# Patient Record
Sex: Female | Born: 1955
Health system: Southern US, Community
[De-identification: ages and names within clinical notes are randomized; demographics above are authoritative.]

## PROBLEM LIST (undated history)

## (undated) DIAGNOSIS — F419 Anxiety disorder, unspecified: Secondary | ICD-10-CM

## (undated) DIAGNOSIS — F32A Depression, unspecified: Secondary | ICD-10-CM

## (undated) DIAGNOSIS — E039 Hypothyroidism, unspecified: Secondary | ICD-10-CM

## (undated) DIAGNOSIS — K219 Gastro-esophageal reflux disease without esophagitis: Secondary | ICD-10-CM

## (undated) DIAGNOSIS — Z9289 Personal history of other medical treatment: Secondary | ICD-10-CM

## (undated) DIAGNOSIS — IMO0002 Reserved for concepts with insufficient information to code with codable children: Secondary | ICD-10-CM

## (undated) DIAGNOSIS — G709 Myoneural disorder, unspecified: Secondary | ICD-10-CM

## (undated) DIAGNOSIS — M1990 Unspecified osteoarthritis, unspecified site: Secondary | ICD-10-CM

## (undated) DIAGNOSIS — F17200 Nicotine dependence, unspecified, uncomplicated: Secondary | ICD-10-CM

## (undated) DIAGNOSIS — H269 Unspecified cataract: Secondary | ICD-10-CM

## (undated) DIAGNOSIS — E785 Hyperlipidemia, unspecified: Secondary | ICD-10-CM

## (undated) DIAGNOSIS — J40 Bronchitis, not specified as acute or chronic: Secondary | ICD-10-CM

## (undated) DIAGNOSIS — J449 Chronic obstructive pulmonary disease, unspecified: Secondary | ICD-10-CM

## (undated) DIAGNOSIS — M810 Age-related osteoporosis without current pathological fracture: Secondary | ICD-10-CM

## (undated) DIAGNOSIS — I10 Essential (primary) hypertension: Secondary | ICD-10-CM

## (undated) DIAGNOSIS — Z8249 Family history of ischemic heart disease and other diseases of the circulatory system: Secondary | ICD-10-CM

## (undated) DIAGNOSIS — Z87898 Personal history of other specified conditions: Secondary | ICD-10-CM

## (undated) DIAGNOSIS — T7840XA Allergy, unspecified, initial encounter: Secondary | ICD-10-CM

## (undated) DIAGNOSIS — R0602 Shortness of breath: Secondary | ICD-10-CM

## (undated) DIAGNOSIS — M353 Polymyalgia rheumatica: Secondary | ICD-10-CM

## (undated) DIAGNOSIS — R6510 Systemic inflammatory response syndrome (SIRS) of non-infectious origin without acute organ dysfunction: Secondary | ICD-10-CM

## (undated) DIAGNOSIS — N189 Chronic kidney disease, unspecified: Secondary | ICD-10-CM

## (undated) DIAGNOSIS — M316 Other giant cell arteritis: Secondary | ICD-10-CM

## (undated) DIAGNOSIS — M8700 Idiopathic aseptic necrosis of unspecified bone: Secondary | ICD-10-CM

## (undated) DIAGNOSIS — F329 Major depressive disorder, single episode, unspecified: Secondary | ICD-10-CM

## (undated) DIAGNOSIS — J439 Emphysema, unspecified: Secondary | ICD-10-CM

## (undated) DIAGNOSIS — D649 Anemia, unspecified: Secondary | ICD-10-CM

## (undated) DIAGNOSIS — M8580 Other specified disorders of bone density and structure, unspecified site: Secondary | ICD-10-CM

## (undated) HISTORY — PX: COLONOSCOPY: SHX174

## (undated) HISTORY — DX: Idiopathic aseptic necrosis of unspecified bone: M87.00

## (undated) HISTORY — DX: Allergy, unspecified, initial encounter: T78.40XA

## (undated) HISTORY — DX: Anxiety disorder, unspecified: F41.9

## (undated) HISTORY — DX: Myoneural disorder, unspecified: G70.9

## (undated) HISTORY — DX: Hypothyroidism, unspecified: E03.9

## (undated) HISTORY — DX: Personal history of other specified conditions: Z87.898

## (undated) HISTORY — PX: COLONOSCOPY W/ POLYPECTOMY: SHX1380

## (undated) HISTORY — PX: TONSILLECTOMY: SUR1361

## (undated) HISTORY — PX: ABDOMINAL ADHESION SURGERY: SHX90

## (undated) HISTORY — DX: Nicotine dependence, unspecified, uncomplicated: F17.200

## (undated) HISTORY — DX: Hyperlipidemia, unspecified: E78.5

## (undated) HISTORY — PX: TOTAL HIP ARTHROPLASTY: SHX124

## (undated) HISTORY — DX: Age-related osteoporosis without current pathological fracture: M81.0

## (undated) HISTORY — PX: CATARACT EXTRACTION: SUR2

## (undated) HISTORY — PX: CHOLECYSTECTOMY: SHX55

## (undated) HISTORY — DX: Other specified disorders of bone density and structure, unspecified site: M85.80

## (undated) HISTORY — DX: Other giant cell arteritis: M31.6

## (undated) HISTORY — DX: Shortness of breath: R06.02

## (undated) HISTORY — PX: WRIST SURGERY: SHX841

## (undated) HISTORY — DX: Anemia, unspecified: D64.9

## (undated) HISTORY — DX: Emphysema, unspecified: J43.9

## (undated) HISTORY — DX: Unspecified cataract: H26.9

## (undated) HISTORY — PX: ABDOMINAL HYSTERECTOMY: SHX81

## (undated) HISTORY — PX: TOTAL SHOULDER REPLACEMENT: SUR1217

## (undated) HISTORY — DX: Unspecified osteoarthritis, unspecified site: M19.90

## (undated) HISTORY — PX: INCONTINENCE SURGERY: SHX676

## (undated) HISTORY — DX: Chronic kidney disease, unspecified: N18.9

## (undated) HISTORY — PX: BREAST EXCISIONAL BIOPSY: SUR124

## (undated) HISTORY — DX: Reserved for concepts with insufficient information to code with codable children: IMO0002

## (undated) HISTORY — DX: Chronic obstructive pulmonary disease, unspecified: J44.9

## (undated) HISTORY — DX: Family history of ischemic heart disease and other diseases of the circulatory system: Z82.49

## (undated) HISTORY — DX: Major depressive disorder, single episode, unspecified: F32.9

## (undated) HISTORY — PX: BREAST SURGERY: SHX581

## (undated) HISTORY — PX: REDUCTION MAMMAPLASTY: SUR839

## (undated) HISTORY — DX: Depression, unspecified: F32.A

---

## 1998-10-08 ENCOUNTER — Encounter: Payer: Self-pay | Admitting: Family Medicine

## 1998-10-08 ENCOUNTER — Ambulatory Visit (HOSPITAL_COMMUNITY): Admission: RE | Admit: 1998-10-08 | Discharge: 1998-10-08 | Payer: Self-pay | Admitting: Family Medicine

## 1998-10-14 ENCOUNTER — Ambulatory Visit (HOSPITAL_COMMUNITY): Admission: RE | Admit: 1998-10-14 | Discharge: 1998-10-14 | Payer: Self-pay | Admitting: Family Medicine

## 1998-10-14 ENCOUNTER — Encounter: Payer: Self-pay | Admitting: Family Medicine

## 2000-07-15 ENCOUNTER — Other Ambulatory Visit: Admission: RE | Admit: 2000-07-15 | Discharge: 2000-07-15 | Payer: Self-pay | Admitting: Family Medicine

## 2000-07-28 ENCOUNTER — Ambulatory Visit (HOSPITAL_COMMUNITY): Admission: RE | Admit: 2000-07-28 | Discharge: 2000-07-28 | Payer: Self-pay | Admitting: Family Medicine

## 2000-07-28 ENCOUNTER — Encounter: Payer: Self-pay | Admitting: Family Medicine

## 2000-08-27 ENCOUNTER — Ambulatory Visit (HOSPITAL_COMMUNITY): Admission: RE | Admit: 2000-08-27 | Discharge: 2000-08-27 | Payer: Self-pay | Admitting: Gastroenterology

## 2000-08-27 ENCOUNTER — Encounter (INDEPENDENT_AMBULATORY_CARE_PROVIDER_SITE_OTHER): Payer: Self-pay | Admitting: *Deleted

## 2001-04-06 ENCOUNTER — Encounter: Admission: RE | Admit: 2001-04-06 | Discharge: 2001-04-06 | Payer: Self-pay | Admitting: Family Medicine

## 2001-04-06 ENCOUNTER — Encounter: Payer: Self-pay | Admitting: Family Medicine

## 2002-08-11 ENCOUNTER — Inpatient Hospital Stay (HOSPITAL_COMMUNITY): Admission: EM | Admit: 2002-08-11 | Discharge: 2002-08-15 | Payer: Self-pay | Admitting: Psychiatry

## 2002-08-11 ENCOUNTER — Encounter: Payer: Self-pay | Admitting: Emergency Medicine

## 2002-08-16 ENCOUNTER — Other Ambulatory Visit (HOSPITAL_COMMUNITY): Admission: RE | Admit: 2002-08-16 | Discharge: 2002-08-28 | Payer: Self-pay | Admitting: Psychiatry

## 2002-08-29 ENCOUNTER — Encounter (HOSPITAL_COMMUNITY): Admission: RE | Admit: 2002-08-29 | Discharge: 2002-08-29 | Payer: Self-pay | Admitting: Psychiatry

## 2002-09-05 ENCOUNTER — Encounter (HOSPITAL_COMMUNITY): Admission: RE | Admit: 2002-09-05 | Discharge: 2002-09-05 | Payer: Self-pay | Admitting: Psychiatry

## 2002-09-19 ENCOUNTER — Encounter (HOSPITAL_COMMUNITY): Admission: RE | Admit: 2002-09-19 | Discharge: 2002-09-19 | Payer: Self-pay | Admitting: Psychiatry

## 2004-03-11 ENCOUNTER — Observation Stay (HOSPITAL_COMMUNITY): Admission: EM | Admit: 2004-03-11 | Discharge: 2004-03-12 | Payer: Self-pay | Admitting: Family Medicine

## 2004-12-05 ENCOUNTER — Encounter: Admission: RE | Admit: 2004-12-05 | Discharge: 2004-12-05 | Payer: Self-pay | Admitting: Internal Medicine

## 2005-01-05 HISTORY — PX: CARDIAC CATHETERIZATION: SHX172

## 2005-03-17 ENCOUNTER — Encounter: Admission: RE | Admit: 2005-03-17 | Discharge: 2005-03-17 | Payer: Self-pay | Admitting: Family Medicine

## 2005-06-15 ENCOUNTER — Encounter (INDEPENDENT_AMBULATORY_CARE_PROVIDER_SITE_OTHER): Payer: Self-pay | Admitting: Specialist

## 2005-06-15 ENCOUNTER — Ambulatory Visit (HOSPITAL_COMMUNITY): Admission: RE | Admit: 2005-06-15 | Discharge: 2005-06-15 | Payer: Self-pay | Admitting: *Deleted

## 2005-08-04 ENCOUNTER — Encounter: Admission: RE | Admit: 2005-08-04 | Discharge: 2005-08-04 | Payer: Self-pay | Admitting: Urology

## 2005-08-05 ENCOUNTER — Ambulatory Visit (HOSPITAL_BASED_OUTPATIENT_CLINIC_OR_DEPARTMENT_OTHER): Admission: RE | Admit: 2005-08-05 | Discharge: 2005-08-05 | Payer: Self-pay | Admitting: Urology

## 2006-01-28 ENCOUNTER — Ambulatory Visit (HOSPITAL_COMMUNITY): Admission: RE | Admit: 2006-01-28 | Discharge: 2006-01-28 | Payer: Self-pay | Admitting: Family Medicine

## 2006-05-04 ENCOUNTER — Encounter: Admission: RE | Admit: 2006-05-04 | Discharge: 2006-05-04 | Payer: Self-pay | Admitting: Family Medicine

## 2007-06-01 ENCOUNTER — Encounter: Admission: RE | Admit: 2007-06-01 | Discharge: 2007-06-01 | Payer: Self-pay | Admitting: Family Medicine

## 2007-08-06 ENCOUNTER — Emergency Department (HOSPITAL_COMMUNITY): Admission: EM | Admit: 2007-08-06 | Discharge: 2007-08-06 | Payer: Self-pay | Admitting: Emergency Medicine

## 2007-08-23 ENCOUNTER — Ambulatory Visit (HOSPITAL_BASED_OUTPATIENT_CLINIC_OR_DEPARTMENT_OTHER): Admission: RE | Admit: 2007-08-23 | Discharge: 2007-08-23 | Payer: Self-pay | Admitting: Surgery

## 2007-08-23 ENCOUNTER — Encounter (INDEPENDENT_AMBULATORY_CARE_PROVIDER_SITE_OTHER): Payer: Self-pay | Admitting: Surgery

## 2008-06-05 ENCOUNTER — Encounter: Admission: RE | Admit: 2008-06-05 | Discharge: 2008-06-05 | Payer: Self-pay | Admitting: Family Medicine

## 2008-06-06 ENCOUNTER — Encounter: Admission: RE | Admit: 2008-06-06 | Discharge: 2008-06-06 | Payer: Self-pay | Admitting: Family Medicine

## 2008-06-19 ENCOUNTER — Ambulatory Visit (HOSPITAL_COMMUNITY): Admission: RE | Admit: 2008-06-19 | Discharge: 2008-06-19 | Payer: Self-pay | Admitting: *Deleted

## 2008-06-19 ENCOUNTER — Encounter (INDEPENDENT_AMBULATORY_CARE_PROVIDER_SITE_OTHER): Payer: Self-pay | Admitting: *Deleted

## 2010-01-05 HISTORY — PX: JOINT REPLACEMENT: SHX530

## 2010-01-26 ENCOUNTER — Encounter: Payer: Self-pay | Admitting: Family Medicine

## 2010-03-11 ENCOUNTER — Other Ambulatory Visit: Payer: Self-pay | Admitting: Family Medicine

## 2010-03-11 DIAGNOSIS — Z1231 Encounter for screening mammogram for malignant neoplasm of breast: Secondary | ICD-10-CM

## 2010-03-19 ENCOUNTER — Ambulatory Visit
Admission: RE | Admit: 2010-03-19 | Discharge: 2010-03-19 | Disposition: A | Discharge status: Home / Self Care | Payer: BC Managed Care – PPO | Source: Ambulatory Visit | Attending: Family Medicine | Admitting: Family Medicine

## 2010-03-19 DIAGNOSIS — Z1231 Encounter for screening mammogram for malignant neoplasm of breast: Secondary | ICD-10-CM

## 2010-03-20 ENCOUNTER — Encounter (INDEPENDENT_AMBULATORY_CARE_PROVIDER_SITE_OTHER): Payer: Self-pay | Admitting: *Deleted

## 2010-03-21 ENCOUNTER — Encounter: Payer: Self-pay | Admitting: Family Medicine

## 2010-03-21 ENCOUNTER — Ambulatory Visit (INDEPENDENT_AMBULATORY_CARE_PROVIDER_SITE_OTHER): Payer: BC Managed Care – PPO | Admitting: Family Medicine

## 2010-03-21 DIAGNOSIS — F17200 Nicotine dependence, unspecified, uncomplicated: Secondary | ICD-10-CM | POA: Insufficient documentation

## 2010-03-21 DIAGNOSIS — Z87891 Personal history of nicotine dependence: Secondary | ICD-10-CM | POA: Insufficient documentation

## 2010-03-21 DIAGNOSIS — F341 Dysthymic disorder: Secondary | ICD-10-CM

## 2010-03-21 DIAGNOSIS — M316 Other giant cell arteritis: Secondary | ICD-10-CM | POA: Insufficient documentation

## 2010-03-21 DIAGNOSIS — E559 Vitamin D deficiency, unspecified: Secondary | ICD-10-CM

## 2010-03-21 DIAGNOSIS — Z8249 Family history of ischemic heart disease and other diseases of the circulatory system: Secondary | ICD-10-CM

## 2010-03-21 DIAGNOSIS — F339 Major depressive disorder, recurrent, unspecified: Secondary | ICD-10-CM | POA: Insufficient documentation

## 2010-03-21 DIAGNOSIS — E039 Hypothyroidism, unspecified: Secondary | ICD-10-CM | POA: Insufficient documentation

## 2010-03-21 DIAGNOSIS — L989 Disorder of the skin and subcutaneous tissue, unspecified: Secondary | ICD-10-CM | POA: Insufficient documentation

## 2010-03-21 DIAGNOSIS — Z8739 Personal history of other diseases of the musculoskeletal system and connective tissue: Secondary | ICD-10-CM | POA: Insufficient documentation

## 2010-03-23 DIAGNOSIS — J45909 Unspecified asthma, uncomplicated: Secondary | ICD-10-CM | POA: Insufficient documentation

## 2010-03-23 DIAGNOSIS — M1990 Unspecified osteoarthritis, unspecified site: Secondary | ICD-10-CM | POA: Insufficient documentation

## 2010-03-25 NOTE — Letter (Signed)
Summary: Results Follow up Letter  [facility]  [address] [place], [place] [address]   Phone: [phone]  Fax: [phone]    03/20/2010 MRN: [id number]    [facility] [address], [place]  [address]    Dear Ms. Husk,  The following are the results of your recent test(s):  Test         Result    Pap Smear:        Normal _____  Not Normal _____ Comments: ______________________________________________________ Cholesterol: LDL(Bad cholesterol):         Your goal is less than:         HDL (Good cholesterol):       Your goal is more than: Comments:  ______________________________________________________ Mammogram:        Normal ___X__  Not Normal _____ Comments:  Yearly follow up is recommended.   ___________________________________________________________________ Hemoccult:        Normal _____  Not normal _______ Comments:    _____________________________________________________________________ Other Tests:    We routinely do not discuss normal results over the telephone.  If you desire a copy of the results, or you have any questions about this information we can discuss them at your next office visit.   Sincerely,    Dwana Curd. Para March, M.D.  [facility]

## 2010-03-27 ENCOUNTER — Other Ambulatory Visit: Payer: Self-pay | Admitting: *Deleted

## 2010-03-27 MED ORDER — LEVOTHYROXINE SODIUM 150 MCG PO CAPS: ORAL_CAPSULE | ORAL | Status: DC

## 2010-04-01 ENCOUNTER — Ambulatory Visit (HOSPITAL_BASED_OUTPATIENT_CLINIC_OR_DEPARTMENT_OTHER)
Admission: RE | Admit: 2010-04-01 | Discharge: 2010-04-01 | Disposition: A | Discharge status: Home / Self Care | Payer: BC Managed Care – PPO | Source: Ambulatory Visit | Attending: Orthopedic Surgery | Admitting: Orthopedic Surgery

## 2010-04-01 ENCOUNTER — Other Ambulatory Visit: Payer: Self-pay | Admitting: Orthopedic Surgery

## 2010-04-01 DIAGNOSIS — F17200 Nicotine dependence, unspecified, uncomplicated: Secondary | ICD-10-CM | POA: Insufficient documentation

## 2010-04-01 DIAGNOSIS — E039 Hypothyroidism, unspecified: Secondary | ICD-10-CM | POA: Insufficient documentation

## 2010-04-01 DIAGNOSIS — M9380 Other specified osteochondropathies of unspecified site: Secondary | ICD-10-CM | POA: Insufficient documentation

## 2010-04-01 DIAGNOSIS — Z01812 Encounter for preprocedural laboratory examination: Secondary | ICD-10-CM | POA: Insufficient documentation

## 2010-04-01 DIAGNOSIS — J4489 Other specified chronic obstructive pulmonary disease: Secondary | ICD-10-CM | POA: Insufficient documentation

## 2010-04-01 DIAGNOSIS — M65839 Other synovitis and tenosynovitis, unspecified forearm: Secondary | ICD-10-CM | POA: Insufficient documentation

## 2010-04-01 DIAGNOSIS — J449 Chronic obstructive pulmonary disease, unspecified: Secondary | ICD-10-CM | POA: Insufficient documentation

## 2010-04-01 DIAGNOSIS — M65849 Other synovitis and tenosynovitis, unspecified hand: Secondary | ICD-10-CM | POA: Insufficient documentation
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
## 2010-04-29 ENCOUNTER — Other Ambulatory Visit (INDEPENDENT_AMBULATORY_CARE_PROVIDER_SITE_OTHER): Payer: BC Managed Care – PPO | Admitting: Family Medicine

## 2010-04-29 ENCOUNTER — Telehealth: Payer: Self-pay | Admitting: Family Medicine

## 2010-04-29 DIAGNOSIS — E039 Hypothyroidism, unspecified: Secondary | ICD-10-CM

## 2010-04-29 DIAGNOSIS — E559 Vitamin D deficiency, unspecified: Secondary | ICD-10-CM

## 2010-04-29 DIAGNOSIS — Z8249 Family history of ischemic heart disease and other diseases of the circulatory system: Secondary | ICD-10-CM

## 2010-04-29 LAB — COMPREHENSIVE METABOLIC PANEL
ALT: 12 U/L (ref 0–35)
AST: 15 U/L (ref 0–37)
Albumin: 4.1 g/dL (ref 3.5–5.2)
Alkaline Phosphatase: 97 U/L (ref 39–117)
Calcium: 9.6 mg/dL (ref 8.4–10.5)
Chloride: 104 mEq/L (ref 96–112)
Creatinine, Ser: 1.4 mg/dL — ABNORMAL HIGH (ref 0.4–1.2)
Potassium: 4.5 mEq/L (ref 3.5–5.1)
Total Protein: 7.2 g/dL (ref 6.0–8.3)

## 2010-04-29 LAB — LIPID PANEL
Cholesterol: 176 mg/dL (ref 0–200)
LDL Cholesterol: 112 mg/dL — ABNORMAL HIGH (ref 0–99)
Total CHOL/HDL Ratio: 3
Triglycerides: 60 mg/dL (ref 0.0–149.0)

## 2010-04-29 MED ORDER — LEVOTHYROXINE SODIUM 150 MCG PO CAPS: ORAL_CAPSULE | ORAL | Status: DC

## 2010-04-29 NOTE — Telephone Encounter (Signed)
Please call pt.  Labs are fine except for TSH.  I would inc synthroid to 1.5 tabs on Sunday and 1 tab a day on other days.  Recheck TSH in 2 months.  Order is in.  Thanks.

## 2010-04-30 ENCOUNTER — Telehealth: Payer: Self-pay | Admitting: *Deleted

## 2010-04-30 NOTE — Telephone Encounter (Signed)
Patient says she has not had Reclast in the past.  It was recommended previously but at that time, she had not met her deductible on her insurance so she could not get it.  She does say, however, that she has met the deductible now as long as she could get it before the end of June.  She has a bone density test scheduled through Dr. Fatima Sanger office on June 8th.  Should she wait for those results or move ahead with the Reclast tx?

## 2010-04-30 NOTE — Telephone Encounter (Signed)
I would not get it until the DXA was done. I would have the patient d/w Dr. Fatima Sanger office.  They may be able to move her DXA, but I can't say for certain about this.

## 2010-04-30 NOTE — Telephone Encounter (Signed)
.  Left detailed message on patient's VM.

## 2010-04-30 NOTE — Telephone Encounter (Signed)
Patient advised.  She says she is going to make an appt to have a mole removed and she will make the lab appt at the same time and will call in for that later.

## 2010-05-09 ENCOUNTER — Other Ambulatory Visit: Payer: Self-pay | Admitting: Orthopaedic Surgery

## 2010-05-09 ENCOUNTER — Telehealth: Payer: Self-pay | Admitting: *Deleted

## 2010-05-09 DIAGNOSIS — Z780 Asymptomatic menopausal state: Secondary | ICD-10-CM

## 2010-05-09 NOTE — Telephone Encounter (Signed)
Pt has brought in form for surgical clearance for hip replacement surgery.  Form is on your desk.

## 2010-05-11 NOTE — Telephone Encounter (Signed)
She needs a OV for this so I can talk to her and check her again. I'll hold the form in my office.

## 2010-05-13 ENCOUNTER — Encounter: Payer: Self-pay | Admitting: Family Medicine

## 2010-05-14 NOTE — Telephone Encounter (Signed)
Advised pt, appt scheduled.

## 2010-05-16 ENCOUNTER — Ambulatory Visit (INDEPENDENT_AMBULATORY_CARE_PROVIDER_SITE_OTHER): Payer: BC Managed Care – PPO | Admitting: Family Medicine

## 2010-05-16 ENCOUNTER — Encounter: Payer: Self-pay | Admitting: Family Medicine

## 2010-05-16 VITALS — BP 140/92 | HR 84 | Temp 98.1°F | Wt 182.0 lb

## 2010-05-16 DIAGNOSIS — Z419 Encounter for procedure for purposes other than remedying health state, unspecified: Secondary | ICD-10-CM

## 2010-05-16 DIAGNOSIS — M8700 Idiopathic aseptic necrosis of unspecified bone: Secondary | ICD-10-CM | POA: Insufficient documentation

## 2010-05-16 DIAGNOSIS — Z136 Encounter for screening for cardiovascular disorders: Secondary | ICD-10-CM

## 2010-05-16 NOTE — Progress Notes (Signed)
H/o AVN and may have L hip replacement per ortho.  Prev op eval.   H/o neg cardiac cath in the past.  No CP/SOB/BLE edema.  No personal h/o DM2, CAD, MI, stent, CABG, CVA, CHF.  No h/o HTN requiring meds.  H/o asthma and is a smoker, currently cutting down smoking and compliant with inhalers.  Rare symptoms of asthma.  H/o TA requiring prev steroids.  H/o low bone density.    No exertional sx.  Activity limited by pain in hip, not by CP disease.   PMH and SH reviewed  ROS: See HPI, otherwise noncontributory.  Meds, vitals, and allergies reviewed.   GEN: nad, alert and oriented HEENT: mucous membranes moist NECK: supple w/o LA CV: rrr PULM: ctab, no inc wob ABD: soft, +bs EXT: no edema SKIN: no acute rash Walk with cane and limps.  L wrist in brace.  EKG reviewed.    Old labs reviewed.

## 2010-05-16 NOTE — Patient Instructions (Signed)
I'll send records to the ortho clinic.  Try to cut down on smoking.  Take care.

## 2010-05-16 NOTE — Assessment & Plan Note (Signed)
>  25 min spent with face to face with patient.  EKG reassuring.  Recheck of BP is 140/90.  I told patient that I cannot 'clear' her for surgery, but that she appears to be appropriately low risk for the surgery.  She has a minimal elevation in Cr, prev at 1.4, but I don't think this will affect her clinical course.  Her asthma is controlled and she is down to 1.5 packs per week.  I don't have a clinical indication to prevent her from going through with surgery.  She understood.  I appreciate the help of ortho in caring for this patient.  I encouraged her to get off tobacco totally.

## 2010-05-19 ENCOUNTER — Encounter: Payer: Self-pay | Admitting: Family Medicine

## 2010-05-19 ENCOUNTER — Telehealth: Payer: Self-pay | Admitting: *Deleted

## 2010-05-19 NOTE — Telephone Encounter (Signed)
Spoke with ortho.  Reviewed Dr. Lianne Bushy note.  Advised Dr. [name] that to was ok to have surgery without further evaluation by specialist.  Will send to PCP as fyi.

## 2010-05-19 NOTE — Telephone Encounter (Signed)
Dr. [name] called asking for clarification on whether or not pt has been cleared by Dr. Para March for orthopedic surgery, he was confused by pt's last office note. Call sent to Dr. Sharen Hones, who spoke with Dr. [name].

## 2010-05-20 ENCOUNTER — Encounter: Payer: Self-pay | Admitting: Family Medicine

## 2010-05-20 NOTE — Op Note (Signed)
NAMEAMBERLIN, April [name]              ACCOUNT NO.:  [id number]   MEDICAL RECORD NO.:  [id number]          PATIENT TYPE:  AMB   LOCATION:  DSC                          FACILITY:  [facility]   PHYSICIAN:  Currie Paris, M.D.DATE OF BIRTH:  [birth year]   DATE OF PROCEDURE:  08/23/2007  DATE OF DISCHARGE:                               OPERATIVE REPORT   PREOPERATIVE DIAGNOSES:  Headaches and possible temporal arteritis.   POSTOPERATIVE DIAGNOSES:  Headaches and possible temporal arteritis.   OPERATION:  Right temporal artery biopsy.   SURGEON:  Currie Paris, MD   ANESTHESIA:  Local.   CLINICAL HISTORY:  This patient has been evaluated and suspected to  possibly have temporal arteritis.  The diagnostic purposes of the  temporal artery biopsy had been suggested and after discussion with the  patient, she agreed to proceed to that.  She has already been started on  prednisone.   The patient was seen in the minor procedure operating room and the  procedure confirmed.  The area of the right temporal artery was clipped  and marked.   The area was then prepped.  It was draped out as a sterile field.  He  was anesthetized with 1% Xylocaine.  I made a longitudinal incision  directly over the artery and placed a self-retaining retractor as I  dissected down until I could identify the artery.  I was able to get a  loop of 4-0 Vicryl suture around it for traction and I freed up about 2  cm to 2.5 cm segment.  This was then clamped and divided proximally and  distally and ligated with 4-0 Vicryl.  The segment was sent to  pathology.  Once I made sure everything was dry, I closed with 3-0  Vicryl, 4-0 Monocryl subcuticular Dermabond.  The patient tolerated the  procedure well and there were no complications.      Currie Paris, M.D.  Electronically Signed     CJS/MEDQ  D:  08/23/2007  T:  08/24/2007  Job:  [id number]

## 2010-05-20 NOTE — Op Note (Signed)
April [name], April [name]              ACCOUNT NO.:  [id number]   MEDICAL RECORD NO.:  [id number]          PATIENT TYPE:  AMB   LOCATION:  ENDO                         FACILITY:  [facility]   PHYSICIAN:  Georgiana Spinner, M.D.    DATE OF BIRTH:  1955-10-21   DATE OF PROCEDURE:  DATE OF DISCHARGE:                               OPERATIVE REPORT   PROCEDURE:  Colonoscopy.   INDICATIONS:  Colon polyps.   ANESTHESIA:  Fentanyl 125 mcg, Versed 12 mg.   PROCEDURE:  With the patient mildly sedated in the left lateral  decubitus position, the Pentax videoscopic pediatric colonoscope was  inserted in the rectum and passed under direct vision with pressure  applied and the patient partially rolled to her back.  We were able to  reach the cecum, identified by the ileocecal valve and appendiceal  orifice; both which were photographed.  From this point, the colonoscope  was slowly withdrawn, taking circumferential views of the colonic mucosa  as we withdrew, the colonoscope taking circumferential views to the  rectum, stopping first in the hepatic flexure area, where a polyp was  seen,  photographed, and removed using hot biopsy forceps technique at  setting of 20/150 blended current.  We next stopped in the splenic  flexure, where a similar polyp was seen.  It, too, was photographed and  removed in the same manner.  We stopped next in the descending colon,  where a third polyp was seen.  It, too, was photographed and removed  using hot biopsy forceps technique at the same setting.  We then stopped  in the rectosigmoid, where a fourth polyp was seen.  It, too, was  photographed and it was removed using snare cautery technique with the  same setting.  Tissue was retrieved by grasping the polyp from the base  with hot biopsy forceps but without applying current.  It was retrieved  in this manner.  The endoscope, as noted, was withdrawn to the rectum,  which appeared normal on direct and retroflexed view.   The endoscope was  straightened and withdrawn.  The patient's vital signs, pulse oximeter  remained stable.  The patient tolerated the procedure well without  apparent complications.   FINDINGS:  Occasional diverticulum of the sigmoid colon, polyps of the  rectosigmoid, descending colon, splenic flexure, hepatic flexure.   PLAN:  Await biopsy report.  The patient will call me for results and  follow up with me as needed as an outpatient.           ______________________________  Georgiana Spinner, M.D.     GMO/MEDQ  D:  06/19/2008  T:  06/19/2008  Job:  [id number]   cc:   Ernestina Penna, M.D.  Fax: [phone]

## 2010-05-23 NOTE — Op Note (Signed)
April [name], April [name]              ACCOUNT NO.:  [id number]   MEDICAL RECORD NO.:  [id number]          PATIENT TYPE:  AMB   LOCATION:  NESC                         FACILITY:  [facility]   PHYSICIAN:  Courtney Paris, M.D.DATE OF BIRTH:  02-09-1955   DATE OF PROCEDURE:  08/05/2005  DATE OF DISCHARGE:                                 OPERATIVE REPORT   PREOPERATIVE DIAGNOSIS:  Stress urinary incontinence.   POSTOPERATIVE DIAGNOSIS:  Stress urinary incontinence.   OPERATION:  Cystoscopy and obturator sling.   ANESTHESIA:  General.   SURGEON:  Courtney Paris, M.D.   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  She did have urodynamic evaluation  because of mixed incontinence and has a hypersensitive but normally  compliant bladder with a little residual and positive SUI.  She enters now  for an obturator sling since she had a lot of adhesions post hysterectomy  and I wanted to stay away fr the superior suprapubic space as much as  possible.   The patient was placed on the operating table in and the dorsal lithotomy  position, after satisfactory induction of general anesthesia was given IV  antibiotics, prepped and draped in the usual sterile fashion.  Foley  catheter was inserted and the labia were then sutured laterally to the mons  pubis.  The obturator fossa was then marked with a marking pen about two and  a half fingerbreadths from the vaginal introitus.  After this was done at  the level of the urethra.  With the Foley in place I could feel the mid  urethra and injected some 1% Xylocaine with 1:1000 epinephrine into this  space and also laterally into the vaginal fornices.  Then a transverse  incision was made across the mid urethra and carried laterally to open up  the space into the endopelvic fascia on either side of the  urethra.  This  was done with sharp and blunt dissection.  Very little bleeding was  encountered.  When the space would accept my finger, I then made a small  incision over the obturator fossa and then passed the [facility] [employer]  curved needle first inferiorly and then directed it medially toward the  endopelvic fascia with my finger in the previously created space.  I popped  through the first the patient's left side and then on the patient's right  the needle.  Then I removed the Foley catheter and inserted the cystoscope  and then with both the right angle on the Foroblique lens, made sure that  the bladder was not violated with these needles which it was not.  She is a  smoker.  There were two little small erythematous areas of the base of the  bladder possibly where the Foley catheter had been inserted but because she  was a smoker may need a follow-up in the future.  The sling was then  attached to the needles and pulled out on either  side through into the  obturator space.   The plastic covering over the sling was then pulled off slowly on each side  with a hemostat underneath the urethra so that it was not too tight but was  snugged up against the urethra.  When this was done, the ends of the sling  were then cut just below the skin on either side with the obturator  incisions.  The wound was irrigated with orthopedic bug juice.  Hemostasis  was quite good.  I closed the vaginal wound and two layers with interrupted  3-0 chromic catgut suture which effected great hemostasis.  A Foley catheter  was left in but clamped off and a vaginal packing was then also placed.  Dermabond was used for the obturator incisions and she was taken recovery  room in good  condition.  In an hour and a half to two hours the Foley which was clamped  off and the vaginal packing will be removed.  The patient need void before  she leaves outpatient.  She will come back in a week to make sure she is   voiding without difficulty, soon if she has problems.      Courtney Paris, M.D.  Electronically Signed     HMK/MEDQ  D:  08/05/2005  T:  08/05/2005  Job:  [id number]

## 2010-05-23 NOTE — Discharge Summary (Signed)
NAMEKARNA, ABED              ACCOUNT NO.:  [id number]   MEDICAL RECORD NO.:  [id number]          PATIENT TYPE:  INP   LOCATION:  5740                         FACILITY:  [facility]   PHYSICIAN:  Nanetta Batty, M.D.   DATE OF BIRTH:  [birth year]   DATE OF ADMISSION:  03/11/2004  DATE OF DISCHARGE:  03/12/2004                                 DISCHARGE SUMMARY   HISTORY OF PRESENT ILLNESS:  Ms. Sidnee Gambrill is a 55 year old white  married female patient with no prior current history of coronary artery  disease.  She was having chest tightness, dyspnea on exertion for 2-[redacted] weeks  along with tachy palpitations.  Thus, she came to the emergency room.  She  was seen by __________ and Dr. Nanetta Batty who decided that she, with her  cardiac risk factors which include smoking and family history, that she  should undergo cardiac catheterization.  This was performed on March 11, 2004.  She had normal coronary arteries.  Her LV was normal.  Her aortic  root was normal without dissection.  The morning of March 12, 2004, her heart  rate was in the 60-70's.  She was not on telemetry.  Her blood pressure was  114/73, 113/78.  Sodium 142, potassium 3.9, BUN 8, creatinine 1.0.  CK and  troponins were negative.  TSH was 0.866.  Her right groin cath site was  without any significant bruising or hematoma.  She was seen by Dr. Nicki Guadalajara who discussed with her smoking cessation, and she was considered  stable to be discharged home.  She will follow up with Dr. Allyson Sabal secondary  to her palpitations that have been going on for about three weeks now.  They  are intermittent and they last for about 15 minutes at a time.  We have told  her to cut down on her caffeine.  If they continue, she may need an event  monitor.   DISCHARGE MEDICATIONS:  1.  Levoxyl 0.125 mg one time per day.  2.  Lexapro 20 mg one time per day.  3.  Advair when needed.  4.  Prilosec 20 mg one time per day x1 month, and then she can  use it p.r.n.      or Protonix 40 mg one time per day.  Use again for a month and then on a      p.r.n. basis.   DISCHARGE DIAGNOSES:  1.  Chest pain associated with shortness of breath and tachy palpitations.      She does not have coronary artery disease status post cardiac      catheterization with normal cords, normal LV function.  2.  Tachy palpitations.  3.  Hyperthyroidism with TSH within normal limits, on Levoxyl.  4.  Smoking.  5.  Hyperlipidemia.  6.  Chronic obstructive pulmonary disease by history.      BB/MEDQ  D:  03/12/2004  T:  03/12/2004  Job:  [id number]   cc:   Elvina Sidle, M.D.  Fax: [phone]

## 2010-05-23 NOTE — Op Note (Signed)
April [name], April [name]              ACCOUNT NO.:  [id number]   MEDICAL RECORD NO.:  [id number]          PATIENT TYPE:  AMB   LOCATION:  ENDO                         FACILITY:  [facility]   PHYSICIAN:  Georgiana Spinner, M.D.    DATE OF BIRTH:  1955-03-13   DATE OF PROCEDURE:  06/15/2005  DATE OF DISCHARGE:                                 OPERATIVE REPORT   PROCEDURE:  Colonoscopy.   INDICATIONS FOR PROCEDURE:  Colon polyps.   ANESTHESIA:  Demerol 60 mg, Versed 8 mg.   DESCRIPTION OF PROCEDURE:  With the patient mildly sedated in the left  lateral decubitus position, the Olympus videoscopic colonoscope was inserted  in the rectum and passed under direct vision to the cecum identified by the  ileocecal valve and appendiceal orifice both of which were photographed. In  the cecum was a small polyp approximately 5 mm in size, it was flat and was  photographed and removed using hot biopsy forceps technique, setting of  20/20 blended current. We next withdrew the colonoscope taking  circumferential views of the remaining colonic mucosa stopping next in the  descending colon where a second polyp was seen, it too was removed using hot  biopsy forceps technique with the same setting. In the rectosigmoid was a  slightly larger polyp that was removed using snare cautery technique with a  setting of 20/20 blended current. The tissue was suctioned into the  colonoscope. Diverticula were seen in the sigmoid colon and then at 15-20 cm  from the anal verge, another polyp was seen. It too was removed using hot  biopsy forceps technique. Just below this at approximately 15 cm from the  anal verge in its own separate container was a polyp or it was tissue. I  could not tell if it was truly polypoid or if it was an inverted  diverticulum, so I elected only to do a mucosal biopsy at this time. The  patient could not hold the air in very well despite our efforts to occlude  her anal orifice and therefore it was  difficult to asses this but biopsies  were taken and placed in a separate jar for assessment. Just below this was  a final polyp that was removed using snare cautery technique and suctioned  into the colonoscope for pathology. The rectum appeared normal on direct and  showed hemorrhoids on retroflexed view. The endoscope was straightened and  withdrawn. The patient's vital signs and pulse oximeter remained stable. The  patient tolerated the procedure well without apparent complications.   FINDINGS:  Polyps as described above in the cecum, descending colon  approximately 20 cm from the anal verge, at 15 cm from the anal verge and in  the rectum. Diverticulosis of the sigmoid colon was also noted as was  internal hemorrhoids.   PLAN:  Await biopsy report. The patient will call me for results and  followup with me as an outpatient.           ______________________________  Georgiana Spinner, M.D.     GMO/MEDQ  D:  06/15/2005  T:  06/15/2005  Job:  [id number]   cc:   Nadara Mustard,   [place]
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
## 2010-05-23 NOTE — Cardiovascular Report (Signed)
NAMEALICYN, KLANN              ACCOUNT NO.:  [id number]   MEDICAL RECORD NO.:  [id number]          PATIENT TYPE:  INP   LOCATION:  1844                         FACILITY:  [facility]   PHYSICIAN:  Nanetta Batty, M.D.   DATE OF BIRTH:  Aug 26, 1955   DATE OF PROCEDURE:  03/11/2004  DATE OF DISCHARGE:                              CARDIAC CATHETERIZATION   April [name] is a 55 year old married white female with positive risk factors  for tobacco abuse and family history who complains of a two week history of  increased dyspnea on exertion accompanied by several episodes of chest pain.  Her worse episode was today associated with diaphoresis.  She went to Urgent  Care where she was given sublingual nitroglycerin with some improvement in  her chest pain.  She was transferred to [facility] for further  evaluation.   On presentation, she had no acute changes on her EKG and she was pain free.  Her exam was benign.  She was treated with IV heparin.  She presents now for  diagnostic coronary arteriography.   DESCRIPTION OF PROCEDURE:  The patient was brought to the second floor [facility] in a postabsorptive state.  She was  premedicated with p.o. Valium.  Her right groin was prepped and shaved in  the usual sterile fashion.  1% Xylocaine was used for local anesthesia.  A 6  French sheath was inserted into the right femoral artery using standard  Seldinger technique.  A 6 French right and left Judkins diagnostic catheters  as well as 6 French pigtail catheter, were used for selective coronary  angiography, left ventriculography, and RCA angiography.  Visipaque dye was  used for the entirety of the case.  Retrograde aortic and left ventricular  pull back pressures were recorded.   HEMODYNAMIC DATA:  1.  Aortic systolic pressure 148, diastolic pressure 91.  2.  Left ventricular systolic pressure 147, end diastolic pressure 11.   SELECTIVE CORONARY  ANGIOGRAPHY:  1.  Left main normal.  2.  LAD normal.  3.  The left circumflex was dominant normal.  4.  The right coronary artery was nondominant and was normal.   LEFT VENTRICULOGRAPHY:  RAO left ventriculogram was performed using 20 mL of  Visipaque dye at 10 mL per second.  The overall LVEF is greater than 50%  without wall motion abnormalities.   RCA ANGIOGRAPHY:  Performed in the LAO view using 20 mL of Visipaque dye at  20 mL per second, arch vessels were intact, there was no evidence of aortic  dissection.   IMPRESSION:  April [name] has essentially normal coronary arteries, normal LV  function, no evidence of dissection.  I believe her chest pain is  noncardiac, most likely reflux related.  The sheath was removed and pressure  was held to achieve hemostasis.  The patient left the lab in stable  condition.  She will be observed overnight and discharged home in the  morning.  She will follow up with her primary care MD.      JB/MEDQ  D:  03/11/2004  T:  03/11/2004  Job:  [id number]

## 2010-05-23 NOTE — H&P (Signed)
April [name], [place] NO.:  [id number]   MEDICAL RECORD NO.:  [id number]          PATIENT TYPE:  EMS   LOCATION:  MAJO                         FACILITY:  [facility]   PHYSICIAN:  April [name], M.D.   DATE OF BIRTH:  01/26/1955   DATE OF ADMISSION:  03/11/2004  DATE OF DISCHARGE:                                HISTORY & PHYSICAL   CHIEF COMPLAINT:  Chest tightness.   HISTORY OF PRESENT ILLNESS:  The patient is a 55 year old female with no  prior history of coronary artery disease who presents to the Urgent Care  this morning, with chest tightness.  She was sent to the [facility] Emergency Room.  The patient complains of dyspnea on exertion for  the last two to three weeks, with some generalized fatigue.  On Saturday she  had substernal chest pain described as tightness while walking, and was  relieved with rest.  This morning while bathing, she had chest tightness  recur with shortness of breath and pain to her shoulders.  Her symptoms were  improved with nitroglycerin at Urgent Care.  She still had some residual  discomfort.   PAST MEDICAL HISTORY:  Unremarkable for diabetes or hypertension.  She has  been told her cholesterol is elevated.  She is on diet for this.  She has  chronic obstructive pulmonary disease by her history, and takes Advair  p.r.n.  Treated hypothyroidism.   PAST SURGICAL HISTORY:  1.  Gallbladder in 1981.  2.  Hysterectomy in 1991.  3.  Breast biopsy in 1993.  4.  Remote tonsillectomy.   CURRENT MEDICATIONS:  1.  Levoxyl 0.125 mg daily.  2.  Lexapro 20 mg daily.  3.  Advair p.r.n.   ALLERGIES:  SULFA.   SOCIAL HISTORY:  She is married.  She works in an office.  She smokes one  pack to 1-1/2 packs a day.  She has one child, no grandchildren.   FAMILY HISTORY:  Remarkable for coronary artery disease.  Her mother is a  patient of ours, and had a bypass in her 56's.  She is 56 years old now.  Her father died at age  84 in an accident.  She has two brothers, one of them  died of a suicide in his 29's, and one is alive and well in his 1's.   REVIEW OF SYSTEMS:  Essentially unremarkable except as noted above.  She  denies any GI bleeding or melena.  She has not had kidney stones or kidney  problems.  She does have intermittent bronchitis.  She has never had a  stroke or a TIA.  She has not had a fever or chills.  She denies any  orthopnea or edema.  She has had occasional palpitations at night.  She has  not had sustained tachycardia or syncope.   PHYSICAL EXAMINATION:  VITAL SIGNS:  Blood pressure 136/81, pulse 78,  respirations 12.  GENERAL:  She is a well-developed and well-nourished female, in no acute  distress.  HEENT:  Normocephalic.  Extraocular movements are intact.  Sclerae are  anicteric.  Lids and conjunctivae are within normal limits.  NECK:  Without bruit, without jugular venous distention.  CHEST:  Clear to auscultation and percussion.  HEART:  A regular rate and rhythm, without obvious murmur, rub, or gallop.  Normal S1, S2.  ABDOMEN:  Nontender.  No hepatosplenomegaly.  EXTREMITIES:  Without edema.  Pulses are 3+/4, no bruits noted.  NEUROLOGIC:  Grossly intact.   Electrocardiogram showed a sinus rhythm without acute changes.   A chest x-ray shows no active disease.   LABORATORY DATA:  CPK, MB and troponin are negative.  Sodium 138, potassium  4.1, BUN 9, creatinine 0.9.  White count 8.3, hemoglobin 14.2, hematocrit  39.9, platelets 176.   IMPRESSION:  1.  Unstable angina.  2.  Family history of coronary artery disease.  3.  History of smoking.  4.  History of hyperlipidemia.  5.  Treated hypothyroidism.   PLAN:  She will be admitted to telemetry and started on heparin and  nitroglycerin and aspirin, a low-dose beta blocker, a PPI, and will check a  lipid profile.      ________________________________________  Abelino Derrick, P.A.   ___________________________________________  April [name], M.D.    LKK/MEDQ  D:  03/11/2004  T:  03/11/2004  Job:  [id number]

## 2010-05-24 NOTE — Telephone Encounter (Signed)
Noted  

## 2010-05-27 ENCOUNTER — Encounter: Payer: Self-pay | Admitting: Family Medicine

## 2010-06-09 ENCOUNTER — Encounter: Payer: Self-pay | Admitting: Family Medicine

## 2010-06-09 ENCOUNTER — Ambulatory Visit
Admission: RE | Admit: 2010-06-09 | Discharge: 2010-06-09 | Disposition: A | Discharge status: Home / Self Care | Payer: BC Managed Care – PPO | Source: Ambulatory Visit | Attending: Orthopaedic Surgery | Admitting: Orthopaedic Surgery

## 2010-06-09 DIAGNOSIS — Z780 Asymptomatic menopausal state: Secondary | ICD-10-CM

## 2010-06-09 DIAGNOSIS — M8580 Other specified disorders of bone density and structure, unspecified site: Secondary | ICD-10-CM | POA: Insufficient documentation

## 2010-06-12 ENCOUNTER — Encounter (HOSPITAL_COMMUNITY)
Admission: RE | Admit: 2010-06-12 | Discharge: 2010-06-12 | Disposition: A | Discharge status: Home / Self Care | Payer: BC Managed Care – PPO | Source: Ambulatory Visit | Attending: Orthopaedic Surgery | Admitting: Orthopaedic Surgery

## 2010-06-12 ENCOUNTER — Other Ambulatory Visit (HOSPITAL_COMMUNITY): Payer: Self-pay | Admitting: Orthopaedic Surgery

## 2010-06-12 ENCOUNTER — Ambulatory Visit (HOSPITAL_COMMUNITY)
Admission: RE | Admit: 2010-06-12 | Discharge: 2010-06-12 | Disposition: A | Discharge status: Home / Self Care | Payer: BC Managed Care – PPO | Source: Ambulatory Visit | Attending: Orthopaedic Surgery | Admitting: Orthopaedic Surgery

## 2010-06-12 DIAGNOSIS — Z96642 Presence of left artificial hip joint: Secondary | ICD-10-CM

## 2010-06-12 DIAGNOSIS — Z01811 Encounter for preprocedural respiratory examination: Secondary | ICD-10-CM | POA: Insufficient documentation

## 2010-06-12 DIAGNOSIS — Z01812 Encounter for preprocedural laboratory examination: Secondary | ICD-10-CM | POA: Insufficient documentation

## 2010-06-12 LAB — COMPREHENSIVE METABOLIC PANEL
ALT: 14 U/L (ref 0–35)
Alkaline Phosphatase: 111 U/L (ref 39–117)
BUN: 15 mg/dL (ref 6–23)
CO2: 25 mEq/L (ref 19–32)
Chloride: 103 mEq/L (ref 96–112)
GFR calc non Af Amer: 55 mL/min — ABNORMAL LOW (ref >60)
Glucose, Bld: 99 mg/dL (ref 70–99)
Potassium: 4.1 mEq/L (ref 3.5–5.1)
Sodium: 139 mEq/L (ref 135–145)
Total Bilirubin: 0.3 mg/dL (ref 0.3–1.2)
Total Protein: 7.5 g/dL (ref 6.0–8.3)

## 2010-06-12 LAB — URINALYSIS, ROUTINE W REFLEX MICROSCOPIC
Bilirubin Urine: NEGATIVE
Hgb urine dipstick: NEGATIVE
Nitrite: NEGATIVE
Specific Gravity, Urine: 1.012 (ref 1.005–1.030)
pH: 5.5 (ref 5.0–8.0)

## 2010-06-12 LAB — CBC
HCT: 39.7 % (ref 36.0–46.0)
MCV: 90.4 fL (ref 78.0–100.0)
Platelets: 207 10*3/uL (ref 150–400)
RBC: 4.39 MIL/uL (ref 3.87–5.11)
WBC: 8.6 10*3/uL (ref 4.0–10.5)

## 2010-06-12 LAB — DIFFERENTIAL
Lymphocytes Relative: 28 % (ref 12–46)
Lymphs Abs: 2.4 10*3/uL (ref 0.7–4.0)
Neutrophils Relative %: 63 % (ref 43–77)

## 2010-06-12 LAB — SURGICAL PCR SCREEN
MRSA, PCR: NEGATIVE
Staphylococcus aureus: NEGATIVE

## 2010-06-12 LAB — PROTIME-INR
INR: 0.99 (ref 0.00–1.49)
Prothrombin Time: 13.3 seconds (ref 11.6–15.2)

## 2010-06-12 LAB — APTT: aPTT: 26 seconds (ref 24–37)

## 2010-06-13 LAB — URINE CULTURE: Culture: NO GROWTH

## 2010-06-17 ENCOUNTER — Inpatient Hospital Stay (HOSPITAL_COMMUNITY): Payer: BC Managed Care – PPO

## 2010-06-17 ENCOUNTER — Inpatient Hospital Stay (HOSPITAL_COMMUNITY)
Admission: RE | Admit: 2010-06-17 | Discharge: 2010-06-20 | DRG: 818 | Disposition: A | Discharge status: Home Health | Payer: BC Managed Care – PPO | Source: Ambulatory Visit | Attending: Orthopaedic Surgery | Admitting: Orthopaedic Surgery

## 2010-06-17 DIAGNOSIS — M87059 Idiopathic aseptic necrosis of unspecified femur: Principal | ICD-10-CM | POA: Diagnosis present

## 2010-06-17 DIAGNOSIS — J4489 Other specified chronic obstructive pulmonary disease: Secondary | ICD-10-CM | POA: Diagnosis present

## 2010-06-17 DIAGNOSIS — M810 Age-related osteoporosis without current pathological fracture: Secondary | ICD-10-CM | POA: Diagnosis present

## 2010-06-17 DIAGNOSIS — E871 Hypo-osmolality and hyponatremia: Secondary | ICD-10-CM | POA: Diagnosis not present

## 2010-06-17 DIAGNOSIS — F17200 Nicotine dependence, unspecified, uncomplicated: Secondary | ICD-10-CM | POA: Diagnosis present

## 2010-06-17 DIAGNOSIS — E039 Hypothyroidism, unspecified: Secondary | ICD-10-CM | POA: Diagnosis present

## 2010-06-17 DIAGNOSIS — R000 Tachycardia, unspecified: Secondary | ICD-10-CM | POA: Diagnosis not present

## 2010-06-17 DIAGNOSIS — J449 Chronic obstructive pulmonary disease, unspecified: Secondary | ICD-10-CM | POA: Diagnosis present

## 2010-06-17 DIAGNOSIS — D62 Acute posthemorrhagic anemia: Secondary | ICD-10-CM | POA: Diagnosis not present

## 2010-06-17 DIAGNOSIS — R42 Dizziness and giddiness: Secondary | ICD-10-CM | POA: Diagnosis not present

## 2010-06-18 LAB — BASIC METABOLIC PANEL
BUN: 16 mg/dL (ref 6–23)
Chloride: 103 mEq/L (ref 96–112)
GFR calc Af Amer: >60 mL/min (ref >60)
GFR calc non Af Amer: 56 mL/min — ABNORMAL LOW (ref >60)
Glucose, Bld: 113 mg/dL — ABNORMAL HIGH (ref 70–99)
Potassium: 4.2 mEq/L (ref 3.5–5.1)
Sodium: 136 mEq/L (ref 135–145)

## 2010-06-18 LAB — CBC
HCT: 27.7 % — ABNORMAL LOW (ref 36.0–46.0)
HCT: 25.6 % — ABNORMAL LOW (ref 36.0–46.0)
Hemoglobin: 9.4 g/dL — ABNORMAL LOW (ref 12.0–15.0)
Hemoglobin: 8.8 g/dL — ABNORMAL LOW (ref 12.0–15.0)
MCH: 31 pg (ref 26.0–34.0)
MCHC: 34.4 g/dL (ref 30.0–36.0)
RBC: 3.03 MIL/uL — ABNORMAL LOW (ref 3.87–5.11)
RBC: 2.84 MIL/uL — ABNORMAL LOW (ref 3.87–5.11)
WBC: 8.4 10*3/uL (ref 4.0–10.5)

## 2010-06-19 LAB — BASIC METABOLIC PANEL
BUN: 10 mg/dL (ref 6–23)
CO2: 27 mEq/L (ref 19–32)
Calcium: 8.1 mg/dL — ABNORMAL LOW (ref 8.4–10.5)
Chloride: 103 mEq/L (ref 96–112)
Creatinine, Ser: 0.81 mg/dL (ref 0.4–1.2)

## 2010-06-19 LAB — CBC
HCT: 24.3 % — ABNORMAL LOW (ref 36.0–46.0)
MCH: 31.2 pg (ref 26.0–34.0)
MCV: 90.3 fL (ref 78.0–100.0)
Platelets: 137 10*3/uL — ABNORMAL LOW (ref 150–400)
RBC: 2.69 MIL/uL — ABNORMAL LOW (ref 3.87–5.11)
WBC: 10.1 10*3/uL (ref 4.0–10.5)

## 2010-06-20 LAB — BASIC METABOLIC PANEL
BUN: 9 mg/dL (ref 6–23)
Calcium: 8.1 mg/dL — ABNORMAL LOW (ref 8.4–10.5)
Creatinine, Ser: 0.76 mg/dL (ref 0.50–1.10)
GFR calc Af Amer: >60 mL/min (ref >60)
GFR calc non Af Amer: >60 mL/min (ref >60)

## 2010-06-20 LAB — CBC
HCT: 28.7 % — ABNORMAL LOW (ref 36.0–46.0)
MCH: 30.7 pg (ref 26.0–34.0)
MCHC: 34.8 g/dL (ref 30.0–36.0)
MCV: 88 fL (ref 78.0–100.0)
Platelets: 139 10*3/uL — ABNORMAL LOW (ref 150–400)
RDW: 14 % (ref 11.5–15.5)

## 2010-06-20 LAB — CROSSMATCH

## 2010-07-16 NOTE — Op Note (Signed)
April [name]              ACCOUNT NO.:  [id number]  MEDICAL RECORD NO.:  [id number]  LOCATION:  5032                         FACILITY:  [facility]  PHYSICIAN:  April [name], M.D.DATE OF BIRTH:  04/19/1955  DATE OF PROCEDURE:  06/17/2010 DATE OF DISCHARGE:                              OPERATIVE REPORT   PREOPERATIVE DIAGNOSIS:  Avascular necrosis, left femoral head.  POSTOPERATIVE DIAGNOSIS:  Avascular necrosis, left femoral head.  PROCEDURE:  Left total hip replacement.  SURGEON:  April [name]. April Dunker, MD  ASSISTANT:  April [name], PAC  ANESTHESIA:  General.  COMPLICATIONS:  None.  COMPONENTS:  DePuy AML large stature 13.5-mm femoral component, a 52-mm outer diameter acetabular 100 series metallic component with a +4 Marathon polyethylene liner with a 10-degree posterior lip, 36-mm outer diameter femoral head with a 1.5-mm neck length, components were Soil scientist.  PROCEDURE:  April [name] was admitted in the holding area and identified her left hip was the appropriate operative site.  She was then transported to room #4 and placed under general anesthesia without difficulty.  Nursing staff inserted a Foley catheter.  Urine was clear.  The patient was then placed in the lateral decubitus position with the left side up and secured to the operating room table with the Innomed hip system.  The left hip was then prepped with Betadine scrub and DuraPrep from iliac crest to below the knee.  Sterile draping was performed.  A routine southern incision was utilized and via sharp dissection carried down to the subcutaneous tissue.  Gross bleeders were Bovie coagulated. Adipose tissue was incised at the level of the iliotibial band.  Self- retaining retractors were inserted.  The iliotibial band was then carefully incised and then separated with the self-retaining retractors. With the hip internally rotated, the short external rotators were identified.  They were  carefully incised in their attachment to the posterior aspect of the greater trochanter.  Two tendinous structures were tagged with 0 Ethibond suture and then incised.  The capsule was easily identified and incised along the femoral neck and head with the Bovie.  There was a serosanguineous joint effusion.  With capsule release, I was able to dislocate the head posteriorly.  There was area of avascular necrosis.  Apical to the fovea that had a ping-pong effect, there was diffuse beefy red synovitis around the neck of the femoral head and within the acetabulum.  Using the calcar guide, the head was osteotomized at the head-neck junction.  With the proximal femur delivered into the wound, I made a starter hole in the piriformis fossa and then inserted the canal finder. Reaming was performed sequentially to 13 mm to accept a 13.5 component with good endosteal purchase with that reamer.  Rasping was performed sequentially to a 13.5 large stature femoral component.  I did not need the calcar reamer as I had excellent fit.  The osteotomy was about one fingerbreadth proximal to the level of the lesser trochanter.  Retractors were then placed around the acetabulum.  The labrum was excised.  Reaming was performed sequentially to 51 mm to accept a 52-mm component.  I then trialed a 50-mm component that would completely seat the  52 with an excellent purchase.  Accordingly, the 100 series 52-mm outer diameter component was then impacted into the acetabulum with an excellent seat and it would completely seat, was nice and tight.  Wound was irrigated.  The trial polyethylene acetabular component was then inserted with a 10-degree posterior lip and +4 thickness.  The 13.5 large stature rasp was then inserted flush on the calcar and the 36-mm outer diameter hip ball with a 1.5-mm neck length was then affixed to the [facility] taper neck.  The wound was irrigated.  The entire construct was then reduced.  It  was perfectly tight.  I felt my leg lengths were symmetrical and I could not dislocate the hip with any range of motion.  The trial components were then removed and the acetabulum was irrigated. The apex hole eliminator was inserted followed by the Marathon polyethylene +4 inner bearing with a 10-degree posterior lip.  I carefully checked it to be sure it was seated.  The wound was then again irrigated.  The final 13.5-mm AML component was then impacted flush on the calcar.  The [facility] taper neck was cleaned. The 36 mm outer diameter hip ball with a +1.5 mm neck length was then impacted.  The acetabulum was inspected for any loose material.  Entire construct was then carefully reduced.  Again, through a full range of motion, the hip was perfectly stable.  I could not sublux or dislocate the hip.  I then checked the sciatic nerve which was checked throughout the procedure and was well out of harm's way and had been protected throughout the case.  The capsule was then closed anatomically with #1 Ethibond.  Short external rotators were closed with similar material.  The wound was again irrigated with saline solution.  The iliotibial band was closed with a running 0 Vicryl, subcu with several layers of 0 and 2-0 Vicryl and 3-0 Monocryl with skin clips.  Sterile bulky dressing was applied. The patient was placed supine on the operating table.  Knee immobilizer was applied to the left leg.  The patient was then awoken and returned to the postanesthesia recovery room without problem.     April [name]. April [name], M.D.     PWW/MEDQ  D:  06/17/2010  T:  06/18/2010  Job:  [id number]  Electronically Signed by [name] M.D. on 07/16/2010 11:41:47 AM

## 2010-08-01 NOTE — Discharge Summary (Signed)
NAMERAMA, SORCI              ACCOUNT NO.:  [id number]  MEDICAL RECORD NO.:  [id number]  LOCATION:  5032                         FACILITY:  [facility]  PHYSICIAN:  Claude Manges. Yemariam Magar, M.D.DATE OF BIRTH:  [birth year]  DATE OF ADMISSION:  06/17/2010 DATE OF DISCHARGE:  06/20/2010                              DISCHARGE SUMMARY   ADMISSION DIAGNOSIS:  Avascular necrosis, left hip.  DISCHARGE DIAGNOSES: 1. Avascular necrosis, left hip. 2. Avascular necrosis, right hip. 3. Osteoporosis. 4. Hypothyroidism status post Iodine 131. 5. Cigarette smoker. 6. History of temporal arteritis.  PROCEDURE:  Left total hip arthroplasty.  HISTORY:  April [name] is a very pleasant 55 year old white female with the painful left hip.  She has recently had 3-4 months history of left hip and groin pain which have become severe and worsening.  She complains of aching pain which is noted with ambulation as well as ADLs. She has nighttime pain also.  Radiographic evidence revealed osteonecrosis, left femoral head.  She is indicated now for left total hip arthroplasty.  HOSPITAL COURSE:  A 55 year old white female admitted on June 17, 2010, and after appropriate laboratory studies were obtained as well as 2 g of Ancef IV on-call to the operating room.  She was taken to the operating room where she underwent a left total hip arthroplasty using a DePuy AML large stature 13.5-mm femoral component with a 52-mm outer diameter acetabular 100 series metallic component with a +4 Marathon polyethylene liner and a 10-degree posterior lip with a 36-mm outer diameter femoral head with a 1.5 at length with components of Press-Fit.  She tolerated the procedure well.  A Foley was placed intraoperatively.  She was started on Xarelto 10 mg one p.o. q.24 hours starting on June 18, 2010 at 8 a.m.  Continued on Ancef 1 g IV q.6 hours for 3 doses.  She was started on Tylenol 1 g IV q.6 hours for 4 doses and then Tylenol 1  g p.o. q.6 hours.  Foley was placed intraoperatively.  A knee immobilizer was placed in left knee to prevent dislocation.  Posterior total hip precautions were ordered.  She was allowed to be weightbearing as tolerated in physical therapy.  X-rays of the left hip were obtained in the post anesthesia care unit.  She was placed on a full-dose PCA pump. She was allowed out of bed to chair the following day.  Her Foley was discontinued.  She was weaned off her PCA as well as nasal O2.  A CBC was also ordered on postop day #1 at 3 p.m.  She did have some difficulty later in the afternoon after discontinuation of the PCA and this was then placed with a Dilaudid order of 0.5 to 1 mg IV q.2-4 hours p.r.n. pain.  She was started on iron 325 mg p.o. b.i.d.  She was given 2 units of packed cells on June 19, 2010, with 20 mg of Lasix between. Remainder of hospital course was uneventful and she was discharged on June 20, 2010, after giving 30 mEq of KCl to return back to the office in 10-14 days for reevaluation and staple removal.  LABORATORY STUDIES:  Hemoglobin on June 18, 2010, revealed  9.4, hematocrit 27.7%, white count 8400, platelets were 165,000.  Hemoglobin on June 19, 2010, was 8.4, hematocrit 24.3%, white count 10,100, and platelets were 137,000.  On June 20, 2010, her hemoglobin was 10.0, hematocrit 28.7%, white count 7600, and platelets were 139,000.  Sodium on June 18, 2010, was 136, potassium 4.2, chloride 103, CO2 of 28, glucose 113, BUN 16, creatinine of 0.01.  On June 20, 2010, revealed sodium 136, potassium 3.3, chloride 102, CO2 of 27, glucose 98, BUN 9, creatinine 0.76.  Her GFR was 56 on June 18, 2010, greater than 60 on June 20, 2010.  No other labs were on the chart at this time.  RADIOGRAPHIC STUDIES:  On June 17, 2010, the hip and AP pelvis reveals anatomic alignment post left hip arthroplasty without acute complicating features.  DISCHARGE INSTRUCTIONS:  She is to  continue on diet as prior to her hospitalization.  Increase activity slowly and no lifting or driving for 6 weeks.  She can shower without dressing, once there is no drainage, do not wash over the wound.  If drainage remains, cover with plastic wrap and then shower.  She is allowed to be full weightbearing with a walker as taught in physical therapy.  She is to use her short TED hose for 3 weeks on the left leg.  May remove these at night time.  She needs to follow up in the office on July 02, 2010.  [facility].  She may change the dressing on Saturday and replace with 4x4 gauze dressing and paper tape.  She was discharged in improved condition.     Oris Drone Petrarca, P.A.-C.   ______________________________ Claude Manges. [name], M.D.    BDP/MEDQ  D:  07/23/2010  T:  07/24/2010  Job:  [id number]  Electronically Signed by Jacqualine Code P.A.-C. on 07/29/2010 09:35:32 AM Electronically Signed by [name] M.D. on 08/01/2010 04:51:14 PM

## 2010-10-02 ENCOUNTER — Other Ambulatory Visit (INDEPENDENT_AMBULATORY_CARE_PROVIDER_SITE_OTHER): Payer: BC Managed Care – PPO

## 2010-10-02 DIAGNOSIS — E039 Hypothyroidism, unspecified: Secondary | ICD-10-CM

## 2010-10-03 LAB — BASIC METABOLIC PANEL
BUN: 12
Calcium: 9.9
Creatinine, Ser: 0.83
GFR calc non Af Amer: >60
Glucose, Bld: 92
Potassium: 3.4 — ABNORMAL LOW

## 2010-10-03 LAB — CBC
HCT: 39.3
Platelets: 156
RDW: 14.6

## 2010-10-03 LAB — TSH: TSH: 5.09 u[IU]/mL (ref 0.35–5.50)

## 2010-10-03 LAB — DIFFERENTIAL
Basophils Absolute: 0
Eosinophils Relative: 1
Lymphocytes Relative: 10 — ABNORMAL LOW
Neutrophils Relative %: 83 — ABNORMAL HIGH

## 2010-10-15 ENCOUNTER — Ambulatory Visit (INDEPENDENT_AMBULATORY_CARE_PROVIDER_SITE_OTHER): Payer: BC Managed Care – PPO | Admitting: Family Medicine

## 2010-10-15 ENCOUNTER — Encounter: Payer: Self-pay | Admitting: Family Medicine

## 2010-10-15 VITALS — BP 138/92 | HR 107 | Temp 98.1°F | Wt 182.0 lb

## 2010-10-15 DIAGNOSIS — D229 Melanocytic nevi, unspecified: Secondary | ICD-10-CM

## 2010-10-15 DIAGNOSIS — D239 Other benign neoplasm of skin, unspecified: Secondary | ICD-10-CM

## 2010-10-15 DIAGNOSIS — E039 Hypothyroidism, unspecified: Secondary | ICD-10-CM

## 2010-10-15 DIAGNOSIS — L989 Disorder of the skin and subcutaneous tissue, unspecified: Secondary | ICD-10-CM

## 2010-10-15 DIAGNOSIS — Z23 Encounter for immunization: Secondary | ICD-10-CM

## 2010-10-15 NOTE — Patient Instructions (Signed)
Keep the area clean and covered with neosporin/bandaid.  Let me know if the fatigue continues after a few more weeks.  We'll contact you with your lab report. Take care.

## 2010-10-15 NOTE — Progress Notes (Signed)
Still with some fatigue, TSH wnl.  She had her L hip replaced and is doing well with that.  Here for biopsy.   Shave biopsy  Meds, vitals, and allergies reviewed.   Indication: suspicious lesion, inc in size per patient  Location: L shoulder  Size: 7mm, brown papule  Verbal informed consent obtained.  Pt aware of risks not limited to but including infection,  bleeding, damage to near by organs.  Prep: etoh/betadine  Anesthesia: 2%lidocaine with epi, good effect  Shave made with dermablade  Minimal oozing, controlled with silver nitrate  Tolerated well  Routine postprocedure instructions d/w pt- keep area clean and bandaged, follow up if concerns/spreading erythema/pain.

## 2010-10-15 NOTE — Assessment & Plan Note (Signed)
She'll call back if fatigue continues.  We can recheck TSH/T4 at that point if needed.  No change in meds now.

## 2010-10-15 NOTE — Assessment & Plan Note (Addendum)
7mm shave done today. Tolerated well, await path.

## 2011-03-24 ENCOUNTER — Other Ambulatory Visit: Payer: Self-pay | Admitting: Family Medicine

## 2011-03-24 DIAGNOSIS — D649 Anemia, unspecified: Secondary | ICD-10-CM

## 2011-03-24 DIAGNOSIS — Z8249 Family history of ischemic heart disease and other diseases of the circulatory system: Secondary | ICD-10-CM

## 2011-03-30 ENCOUNTER — Other Ambulatory Visit (INDEPENDENT_AMBULATORY_CARE_PROVIDER_SITE_OTHER): Payer: BC Managed Care – PPO

## 2011-03-30 DIAGNOSIS — Z8249 Family history of ischemic heart disease and other diseases of the circulatory system: Secondary | ICD-10-CM

## 2011-03-30 DIAGNOSIS — D649 Anemia, unspecified: Secondary | ICD-10-CM

## 2011-03-30 DIAGNOSIS — M810 Age-related osteoporosis without current pathological fracture: Secondary | ICD-10-CM

## 2011-03-30 DIAGNOSIS — E039 Hypothyroidism, unspecified: Secondary | ICD-10-CM

## 2011-03-30 LAB — TSH: TSH: 1.26 u[IU]/mL (ref 0.35–5.50)

## 2011-03-30 LAB — CBC WITH DIFFERENTIAL/PLATELET
Basophils Relative: 0.9 % (ref 0.0–3.0)
Eosinophils Relative: 3 % (ref 0.0–5.0)
HCT: 41.4 % (ref 36.0–46.0)
Hemoglobin: 13.9 g/dL (ref 12.0–15.0)
Lymphs Abs: 2 10*3/uL (ref 0.7–4.0)
MCV: 94.8 fl (ref 78.0–100.0)
Monocytes Absolute: 0.6 10*3/uL (ref 0.1–1.0)
RBC: 4.37 Mil/uL (ref 3.87–5.11)
WBC: 6.3 10*3/uL (ref 4.5–10.5)

## 2011-03-30 LAB — COMPREHENSIVE METABOLIC PANEL
Albumin: 4.5 g/dL (ref 3.5–5.2)
Alkaline Phosphatase: 103 U/L (ref 39–117)
BUN: 26 mg/dL — ABNORMAL HIGH (ref 6–23)
Glucose, Bld: 106 mg/dL — ABNORMAL HIGH (ref 70–99)
Potassium: 3.9 mEq/L (ref 3.5–5.1)
Total Bilirubin: 0.3 mg/dL (ref 0.3–1.2)

## 2011-03-30 LAB — T3, FREE: T3, Free: 2.8 pg/mL (ref 2.3–4.2)

## 2011-03-30 LAB — T4, FREE: Free T4: 1.41 ng/dL (ref 0.60–1.60)

## 2011-03-31 LAB — VITAMIN D 25 HYDROXY (VIT D DEFICIENCY, FRACTURES): Vit D, 25-Hydroxy: 59 ng/mL (ref 30–89)

## 2011-04-06 ENCOUNTER — Ambulatory Visit (INDEPENDENT_AMBULATORY_CARE_PROVIDER_SITE_OTHER): Payer: BC Managed Care – PPO | Admitting: Family Medicine

## 2011-04-06 ENCOUNTER — Other Ambulatory Visit: Payer: Self-pay | Admitting: Family Medicine

## 2011-04-06 ENCOUNTER — Encounter: Payer: Self-pay | Admitting: Family Medicine

## 2011-04-06 VITALS — BP 122/86 | HR 88 | Temp 98.4°F | Wt 175.0 lb

## 2011-04-06 DIAGNOSIS — Z0000 Encounter for general adult medical examination without abnormal findings: Secondary | ICD-10-CM

## 2011-04-06 DIAGNOSIS — Z23 Encounter for immunization: Secondary | ICD-10-CM

## 2011-04-06 DIAGNOSIS — F341 Dysthymic disorder: Secondary | ICD-10-CM

## 2011-04-06 DIAGNOSIS — F17200 Nicotine dependence, unspecified, uncomplicated: Secondary | ICD-10-CM

## 2011-04-06 DIAGNOSIS — Z1231 Encounter for screening mammogram for malignant neoplasm of breast: Secondary | ICD-10-CM

## 2011-04-06 DIAGNOSIS — E039 Hypothyroidism, unspecified: Secondary | ICD-10-CM

## 2011-04-06 MED ORDER — ALPRAZOLAM 0.5 MG PO TABS: 0.5000 mg | ORAL_TABLET | Freq: Every evening | ORAL | Status: DC | PRN

## 2011-04-06 MED ORDER — ALBUTEROL SULFATE HFA 108 (90 BASE) MCG/ACT IN AERS: 2.0000 | INHALATION_SPRAY | Freq: Four times a day (QID) | RESPIRATORY_TRACT | Status: DC | PRN

## 2011-04-06 MED ORDER — BUPROPION HCL ER (XL) 300 MG PO TB24: 300.0000 mg | ORAL_TABLET | Freq: Every day | ORAL | Status: DC

## 2011-04-06 MED ORDER — LEVOTHYROXINE SODIUM 150 MCG PO CAPS: ORAL_CAPSULE | ORAL | Status: DC

## 2011-04-06 MED ORDER — FLUTICASONE-SALMETEROL 250-50 MCG/DOSE IN AEPB: 2.0000 | INHALATION_SPRAY | Freq: Every day | RESPIRATORY_TRACT | Status: DC

## 2011-04-06 NOTE — Progress Notes (Signed)
CPE- See plan.  Routine anticipatory guidance given to patient.  See health maintenance. Pap smear not indicated.  D/w pt.   Mammogram pending to be done next month.  Breast exam done at breast center Flu shot prev done.  Td due.   PNA vaccine done 2011 Colon cancer screening.  +FH.  Colonoscopy done 2011 and normal per patient.  Due for 5 year f/u.    Anxiety: still jittery occ, with occ BZD use.  She had a period of time w/o BZD need/use.  She's had a lot going on with mult surgeries last year.  Her mood is okay o/w.  These sx may be a hold over from the prev episodes.  This isn't a panic attack like she had had prev.    Smoking- started back but down to ~4 cigs a day.   Rare SABA use.  Complaint with advair with good effect.    Hypothyroid.  TSH wnl.   Compliant with meds.  No neck mass, no dysphagia  PMH and SH reviewed  Meds, vitals, and allergies reviewed.   ROS: See HPI.  Otherwise negative.    GEN: nad, alert and oriented HEENT: mucous membranes moist NECK: supple w/o LA, no goiter CV: rrr. PULM: ctab, no inc wob ABD: soft, +bs EXT: no edema SKIN: no acute rash

## 2011-04-06 NOTE — Patient Instructions (Signed)
I would get a flu shot each fall.   I would gradually try to exercise more.   Work on the last 4 cigarettes.   Take care.  Glad to see you.  Let me know if the anxiety gets worse.

## 2011-04-07 DIAGNOSIS — Z0000 Encounter for general adult medical examination without abnormal findings: Secondary | ICD-10-CM | POA: Insufficient documentation

## 2011-04-07 NOTE — Assessment & Plan Note (Signed)
D/w pt about smoking.  She'll work on this.

## 2011-04-07 NOTE — Assessment & Plan Note (Signed)
TSH wnl, continue current meds.  

## 2011-04-07 NOTE — Assessment & Plan Note (Signed)
Routine anticipatory guidance given to patient. See health maintenance.  Pap smear not indicated. D/w pt.  Mammogram pending to be done next month. Breast exam done at breast center  Flu shot prev done.  Td due.  PNA vaccine done 2011  Colon cancer screening. +FH. Colonoscopy done 2011 and normal per patient. Due for 5 year f/u.

## 2011-04-07 NOTE — Assessment & Plan Note (Signed)
Continue current meds for now.  I think some of this is a holdover from mult procedures/illnesses last years.  No SI/HI.  If worsening, she'll notify us.  Okay for outpatient f/u.

## 2011-04-23 ENCOUNTER — Ambulatory Visit
Admission: RE | Admit: 2011-04-23 | Discharge: 2011-04-23 | Disposition: A | Discharge status: Home / Self Care | Payer: BC Managed Care – PPO | Source: Ambulatory Visit | Attending: Family Medicine | Admitting: Family Medicine

## 2011-04-23 DIAGNOSIS — Z1231 Encounter for screening mammogram for malignant neoplasm of breast: Secondary | ICD-10-CM

## 2011-04-27 ENCOUNTER — Encounter: Payer: Self-pay | Admitting: *Deleted

## 2011-07-21 ENCOUNTER — Telehealth: Payer: Self-pay

## 2011-07-21 NOTE — Telephone Encounter (Signed)
She need to get seen either here or there.  I wouldn't change meds w/o having her TSH checked first.

## 2011-07-21 NOTE — Telephone Encounter (Signed)
Patient advised.  She says she will not be back here until Labor Day so she will try to get established with an MD in that area and would like to continue to keep you as her PCP while here.

## 2011-07-21 NOTE — Telephone Encounter (Signed)
Pt moved to [place], [place]. For approx 1 month pt having fast heart beat average 110 on and off, SOB upon minimal exertion on and off not necessarily at the same time. Feeling anxious, hair loss, seeing scalp 2 different areas on head. No chest pain, h/a or dizziness. Pt stopped taking the extra 1/2 pill of Levothyroxine on Sundays 1 month ago. Pt taking Levothyroxine 150 mcg daily. Pt cannot come in to be seen or get labs since in [facility] and pt has not contacted doctor at new location. Pt wants to know if can adjust thyroid med to see if will feel better? [facility].If pt condition changes or worsens pt will go to [facility].Please advise.

## 2011-07-22 NOTE — Telephone Encounter (Signed)
Patient notified as instructed by telephone. 

## 2011-07-22 NOTE — Telephone Encounter (Signed)
Noted, please have her get the records sent here and have the new MD there request her old records. Thanks.

## 2012-05-16 ENCOUNTER — Other Ambulatory Visit: Payer: Self-pay

## 2012-05-16 DIAGNOSIS — Z1231 Encounter for screening mammogram for malignant neoplasm of breast: Secondary | ICD-10-CM

## 2012-06-02 ENCOUNTER — Other Ambulatory Visit: Payer: Self-pay | Admitting: Rheumatology

## 2012-06-02 DIAGNOSIS — M810 Age-related osteoporosis without current pathological fracture: Secondary | ICD-10-CM

## 2012-06-22 ENCOUNTER — Ambulatory Visit
Admission: RE | Admit: 2012-06-22 | Discharge: 2012-06-22 | Disposition: A | Discharge status: Home / Self Care | Payer: BC Managed Care – PPO | Source: Ambulatory Visit

## 2012-06-22 ENCOUNTER — Ambulatory Visit
Admission: RE | Admit: 2012-06-22 | Discharge: 2012-06-22 | Disposition: A | Discharge status: Home / Self Care | Payer: BC Managed Care – PPO | Source: Ambulatory Visit | Attending: Rheumatology | Admitting: Rheumatology

## 2012-06-22 DIAGNOSIS — M810 Age-related osteoporosis without current pathological fracture: Secondary | ICD-10-CM

## 2012-06-22 DIAGNOSIS — Z1231 Encounter for screening mammogram for malignant neoplasm of breast: Secondary | ICD-10-CM

## 2012-06-23 ENCOUNTER — Encounter: Payer: Self-pay | Admitting: *Deleted

## 2012-07-28 ENCOUNTER — Other Ambulatory Visit: Payer: BC Managed Care – PPO

## 2012-08-02 ENCOUNTER — Encounter: Payer: BC Managed Care – PPO | Admitting: Family Medicine

## 2012-09-23 ENCOUNTER — Encounter (HOSPITAL_COMMUNITY): Payer: Self-pay | Admitting: Pharmacy Technician

## 2012-09-29 ENCOUNTER — Encounter (HOSPITAL_COMMUNITY)
Admission: RE | Admit: 2012-09-29 | Discharge: 2012-09-29 | Disposition: A | Discharge status: Home / Self Care | Payer: BC Managed Care – PPO | Source: Ambulatory Visit | Attending: Orthopaedic Surgery | Admitting: Orthopaedic Surgery

## 2012-09-29 ENCOUNTER — Encounter (HOSPITAL_COMMUNITY)
Admission: RE | Admit: 2012-09-29 | Discharge: 2012-09-29 | Disposition: A | Discharge status: Home / Self Care | Payer: BC Managed Care – PPO | Source: Ambulatory Visit | Attending: Orthopedic Surgery | Admitting: Orthopedic Surgery

## 2012-09-29 ENCOUNTER — Encounter (HOSPITAL_COMMUNITY): Payer: Self-pay

## 2012-09-29 DIAGNOSIS — Z01818 Encounter for other preprocedural examination: Secondary | ICD-10-CM | POA: Insufficient documentation

## 2012-09-29 DIAGNOSIS — Z01810 Encounter for preprocedural cardiovascular examination: Secondary | ICD-10-CM | POA: Insufficient documentation

## 2012-09-29 DIAGNOSIS — Z01812 Encounter for preprocedural laboratory examination: Secondary | ICD-10-CM | POA: Insufficient documentation

## 2012-09-29 HISTORY — DX: Depression, unspecified: F32.A

## 2012-09-29 HISTORY — DX: Major depressive disorder, single episode, unspecified: F32.9

## 2012-09-29 HISTORY — DX: Bronchitis, not specified as acute or chronic: J40

## 2012-09-29 HISTORY — DX: Polymyalgia rheumatica: M35.3

## 2012-09-29 HISTORY — DX: Anxiety disorder, unspecified: F41.9

## 2012-09-29 LAB — TYPE AND SCREEN: ABO/RH(D): O POS

## 2012-09-29 LAB — SURGICAL PCR SCREEN: MRSA, PCR: NEGATIVE

## 2012-09-29 LAB — CBC
MCH: 31.7 pg (ref 26.0–34.0)
MCHC: 34.8 g/dL (ref 30.0–36.0)
MCV: 91 fL (ref 78.0–100.0)
Platelets: 212 10*3/uL (ref 150–400)
RDW: 13.9 % (ref 11.5–15.5)

## 2012-09-29 LAB — URINALYSIS, ROUTINE W REFLEX MICROSCOPIC
Glucose, UA: NEGATIVE mg/dL
Ketones, ur: 15 mg/dL — AB
Leukocytes, UA: NEGATIVE
Protein, ur: NEGATIVE mg/dL

## 2012-09-29 LAB — COMPREHENSIVE METABOLIC PANEL
AST: 15 U/L (ref 0–37)
Albumin: 4.5 g/dL (ref 3.5–5.2)
Calcium: 9.5 mg/dL (ref 8.4–10.5)
Creatinine, Ser: 1.17 mg/dL — ABNORMAL HIGH (ref 0.50–1.10)

## 2012-09-29 LAB — PROTIME-INR: INR: 0.99 (ref 0.00–1.49)

## 2012-09-29 NOTE — Pre-Procedure Instructions (Signed)
[name]  09/29/2012   Your procedure is scheduled on:  Tuesday October 04, 2012.  Report to [facility]  2 * 3 at 8:30 AM.  Call this number if you have problems the morning of surgery: [phone]   Remember:   Do not eat food or drink liquids after midnight.   Take these medicines the morning of surgery with A SIP OF WATER: Albuterol inhaler if needed for wheezing or shortness of breath, Bupropion (Wellbutrin XL), Advair inhaler, and Levothyroxine, Tramadol if needed for pain, and Xanax if needed   Do not wear jewelry, make-up or nail polish.  Do not wear lotions, powders, or perfumes. You may wear deodorant.  Do not shave 48 hours prior to surgery.   Do not bring valuables to the hospital.  [facility] is not responsible for any belongings or valuables.               Contacts, dentures or bridgework may not be worn into surgery.  Leave suitcase in the car. After surgery it may be brought to your room.  For patients admitted to the hospital, discharge time is determined by your treatment team.               Patients discharged the day of surgery will not be allowed to drive home.  Name and phone number of your driver: Family/Friend  Special Instructions: Shower using CHG 2 nights before surgery and the night before surgery.  If you shower the day of surgery use CHG.  Use special wash - you have one bottle of CHG for all showers.  You should use approximately 1/3 of the bottle for each shower.   Please read over the following fact sheets that you were given: Pain Booklet, Coughing and Deep Breathing, Blood Transfusion Information, Total Joint Packet, MRSA Information and Surgical Site Infection Prevention

## 2012-09-30 LAB — URINE CULTURE

## 2012-10-03 MED ORDER — ACETAMINOPHEN 500 MG PO TABS
1000.0000 mg | ORAL_TABLET | Freq: Once | ORAL | Status: AC
  Administered 2012-10-04: 1000 mg via ORAL
  Filled 2012-10-03: qty 2

## 2012-10-03 MED ORDER — CEFAZOLIN SODIUM-DEXTROSE 2-3 GM-% IV SOLR
2.0000 g | INTRAVENOUS | Status: AC
  Administered 2012-10-04: 2 g via INTRAVENOUS
  Filled 2012-10-03: qty 50

## 2012-10-04 ENCOUNTER — Encounter (HOSPITAL_COMMUNITY)
Admission: RE | Disposition: A | Discharge status: Home Health | Payer: Self-pay | Source: Ambulatory Visit | Attending: Orthopaedic Surgery

## 2012-10-04 ENCOUNTER — Inpatient Hospital Stay (HOSPITAL_COMMUNITY): Payer: BC Managed Care – PPO

## 2012-10-04 ENCOUNTER — Inpatient Hospital Stay (HOSPITAL_COMMUNITY)
Admission: RE | Admit: 2012-10-04 | Discharge: 2012-10-06 | DRG: 818 | Disposition: A | Discharge status: Home Health | Payer: BC Managed Care – PPO | Source: Ambulatory Visit | Attending: Orthopaedic Surgery | Admitting: Orthopaedic Surgery

## 2012-10-04 ENCOUNTER — Encounter (HOSPITAL_COMMUNITY): Payer: Self-pay | Admitting: Certified Registered"

## 2012-10-04 ENCOUNTER — Ambulatory Visit (HOSPITAL_COMMUNITY): Payer: BC Managed Care – PPO | Admitting: Certified Registered"

## 2012-10-04 DIAGNOSIS — Z833 Family history of diabetes mellitus: Secondary | ICD-10-CM

## 2012-10-04 DIAGNOSIS — F411 Generalized anxiety disorder: Secondary | ICD-10-CM | POA: Diagnosis present

## 2012-10-04 DIAGNOSIS — Z7901 Long term (current) use of anticoagulants: Secondary | ICD-10-CM

## 2012-10-04 DIAGNOSIS — F17200 Nicotine dependence, unspecified, uncomplicated: Secondary | ICD-10-CM | POA: Diagnosis present

## 2012-10-04 DIAGNOSIS — M353 Polymyalgia rheumatica: Secondary | ICD-10-CM | POA: Diagnosis present

## 2012-10-04 DIAGNOSIS — M87059 Idiopathic aseptic necrosis of unspecified femur: Principal | ICD-10-CM | POA: Diagnosis present

## 2012-10-04 DIAGNOSIS — J449 Chronic obstructive pulmonary disease, unspecified: Secondary | ICD-10-CM | POA: Diagnosis present

## 2012-10-04 DIAGNOSIS — E039 Hypothyroidism, unspecified: Secondary | ICD-10-CM | POA: Diagnosis present

## 2012-10-04 DIAGNOSIS — Z96649 Presence of unspecified artificial hip joint: Secondary | ICD-10-CM

## 2012-10-04 DIAGNOSIS — Z8249 Family history of ischemic heart disease and other diseases of the circulatory system: Secondary | ICD-10-CM

## 2012-10-04 DIAGNOSIS — Z79899 Other long term (current) drug therapy: Secondary | ICD-10-CM

## 2012-10-04 DIAGNOSIS — F329 Major depressive disorder, single episode, unspecified: Secondary | ICD-10-CM | POA: Diagnosis present

## 2012-10-04 DIAGNOSIS — F3289 Other specified depressive episodes: Secondary | ICD-10-CM | POA: Diagnosis present

## 2012-10-04 DIAGNOSIS — J4489 Other specified chronic obstructive pulmonary disease: Secondary | ICD-10-CM | POA: Diagnosis present

## 2012-10-04 DIAGNOSIS — M87051 Idiopathic aseptic necrosis of right femur: Secondary | ICD-10-CM | POA: Diagnosis present

## 2012-10-04 HISTORY — PX: TOTAL HIP ARTHROPLASTY: SHX124

## 2012-10-04 SURGERY — ARTHROPLASTY, HIP, TOTAL,POSTERIOR APPROACH
Anesthesia: General | Site: Hip | Laterality: Right | Wound class: Clean

## 2012-10-04 MED ORDER — HYDROMORPHONE HCL PF 1 MG/ML IJ SOLN
0.2500 mg | INTRAMUSCULAR | Status: DC | PRN
  Administered 2012-10-04: 0.5 mg via INTRAVENOUS

## 2012-10-04 MED ORDER — PHENYLEPHRINE HCL 10 MG/ML IJ SOLN
INTRAMUSCULAR | Status: DC | PRN
  Administered 2012-10-04: 40 ug via INTRAVENOUS
  Administered 2012-10-04: 120 ug via INTRAVENOUS
  Administered 2012-10-04 (×2): 80 ug via INTRAVENOUS
  Administered 2012-10-04 (×2): 40 ug via INTRAVENOUS

## 2012-10-04 MED ORDER — LIDOCAINE HCL (CARDIAC) 20 MG/ML IV SOLN
INTRAVENOUS | Status: DC | PRN
  Administered 2012-10-04: 60 mg via INTRAVENOUS

## 2012-10-04 MED ORDER — INFLUENZA VAC SPLIT QUAD 0.5 ML IM SUSP
0.5000 mL | INTRAMUSCULAR | Status: AC
  Administered 2012-10-05: 0.5 mL via INTRAMUSCULAR
  Filled 2012-10-04: qty 0.5

## 2012-10-04 MED ORDER — MAGNESIUM HYDROXIDE 400 MG/5ML PO SUSP: 30.0000 mL | Freq: Every day | ORAL | Status: DC | PRN

## 2012-10-04 MED ORDER — CEFAZOLIN SODIUM-DEXTROSE 2-3 GM-% IV SOLR
2.0000 g | Freq: Four times a day (QID) | INTRAVENOUS | Status: AC
  Administered 2012-10-04 (×2): 2 g via INTRAVENOUS
  Filled 2012-10-04 (×2): qty 50

## 2012-10-04 MED ORDER — FLEET ENEMA 7-19 GM/118ML RE ENEM: 1.0000 | ENEMA | Freq: Once | RECTAL | Status: AC | PRN

## 2012-10-04 MED ORDER — ROCURONIUM BROMIDE 100 MG/10ML IV SOLN
INTRAVENOUS | Status: DC | PRN
  Administered 2012-10-04: 50 mg via INTRAVENOUS

## 2012-10-04 MED ORDER — ALBUTEROL SULFATE HFA 108 (90 BASE) MCG/ACT IN AERS
2.0000 | INHALATION_SPRAY | Freq: Four times a day (QID) | RESPIRATORY_TRACT | Status: DC | PRN
  Filled 2012-10-04: qty 6.7

## 2012-10-04 MED ORDER — FENTANYL CITRATE 0.05 MG/ML IJ SOLN
INTRAMUSCULAR | Status: AC
  Filled 2012-10-04: qty 2

## 2012-10-04 MED ORDER — BUPIVACAINE-EPINEPHRINE PF 0.25-1:200000 % IJ SOLN
INTRAMUSCULAR | Status: AC
  Filled 2012-10-04: qty 30

## 2012-10-04 MED ORDER — METHOCARBAMOL 500 MG PO TABS
500.0000 mg | ORAL_TABLET | Freq: Four times a day (QID) | ORAL | Status: DC | PRN
  Administered 2012-10-04 – 2012-10-06 (×7): 500 mg via ORAL
  Filled 2012-10-04 (×8): qty 1

## 2012-10-04 MED ORDER — SODIUM CHLORIDE 0.9 % IV SOLN
INTRAVENOUS | Status: DC
  Administered 2012-10-05: 03:00:00 via INTRAVENOUS

## 2012-10-04 MED ORDER — LACTATED RINGERS IV SOLN
INTRAVENOUS | Status: DC
  Administered 2012-10-04 (×2): via INTRAVENOUS

## 2012-10-04 MED ORDER — GLYCOPYRROLATE 0.2 MG/ML IJ SOLN
INTRAMUSCULAR | Status: DC | PRN
  Administered 2012-10-04: 0.4 mg via INTRAVENOUS

## 2012-10-04 MED ORDER — FENTANYL CITRATE 0.05 MG/ML IJ SOLN
25.0000 ug | Freq: Once | INTRAMUSCULAR | Status: AC
  Administered 2012-10-04: 25 ug via INTRAVENOUS

## 2012-10-04 MED ORDER — ONDANSETRON HCL 4 MG/2ML IJ SOLN: 4.0000 mg | Freq: Once | INTRAMUSCULAR | Status: DC | PRN

## 2012-10-04 MED ORDER — CHLORHEXIDINE GLUCONATE 4 % EX LIQD: 60.0000 mL | Freq: Every day | CUTANEOUS | Status: DC

## 2012-10-04 MED ORDER — METOCLOPRAMIDE HCL 5 MG/ML IJ SOLN: 5.0000 mg | Freq: Three times a day (TID) | INTRAMUSCULAR | Status: DC | PRN

## 2012-10-04 MED ORDER — MOMETASONE FURO-FORMOTEROL FUM 100-5 MCG/ACT IN AERO
2.0000 | INHALATION_SPRAY | Freq: Two times a day (BID) | RESPIRATORY_TRACT | Status: DC
  Administered 2012-10-04 – 2012-10-06 (×4): 2 via RESPIRATORY_TRACT
  Filled 2012-10-04: qty 8.8

## 2012-10-04 MED ORDER — ONDANSETRON HCL 4 MG/2ML IJ SOLN: 4.0000 mg | Freq: Four times a day (QID) | INTRAMUSCULAR | Status: DC | PRN

## 2012-10-04 MED ORDER — ONDANSETRON HCL 4 MG/2ML IJ SOLN
INTRAMUSCULAR | Status: DC | PRN
  Administered 2012-10-04: 4 mg via INTRAVENOUS

## 2012-10-04 MED ORDER — FENTANYL CITRATE 0.05 MG/ML IJ SOLN
INTRAMUSCULAR | Status: DC | PRN
  Administered 2012-10-04 (×2): 100 ug via INTRAVENOUS
  Administered 2012-10-04: 50 ug via INTRAVENOUS

## 2012-10-04 MED ORDER — ALPRAZOLAM 0.5 MG PO TABS: 0.5000 mg | ORAL_TABLET | Freq: Every evening | ORAL | Status: DC | PRN

## 2012-10-04 MED ORDER — METOCLOPRAMIDE HCL 10 MG PO TABS: 5.0000 mg | ORAL_TABLET | Freq: Three times a day (TID) | ORAL | Status: DC | PRN

## 2012-10-04 MED ORDER — HYDROMORPHONE HCL PF 1 MG/ML IJ SOLN
INTRAMUSCULAR | Status: AC
  Filled 2012-10-04: qty 1

## 2012-10-04 MED ORDER — OXYCODONE HCL 5 MG PO TABS
5.0000 mg | ORAL_TABLET | Freq: Once | ORAL | Status: AC | PRN
  Administered 2012-10-04: 5 mg via ORAL

## 2012-10-04 MED ORDER — ACETAMINOPHEN 325 MG PO TABS
650.0000 mg | ORAL_TABLET | Freq: Four times a day (QID) | ORAL | Status: DC | PRN
  Administered 2012-10-05: 650 mg via ORAL
  Filled 2012-10-04: qty 2

## 2012-10-04 MED ORDER — MIDAZOLAM HCL 5 MG/5ML IJ SOLN
INTRAMUSCULAR | Status: DC | PRN
  Administered 2012-10-04: 2 mg via INTRAVENOUS

## 2012-10-04 MED ORDER — SODIUM CHLORIDE 0.9 % IR SOLN
Status: DC | PRN
  Administered 2012-10-04: 1000 mL

## 2012-10-04 MED ORDER — KETOROLAC TROMETHAMINE 15 MG/ML IJ SOLN
15.0000 mg | Freq: Four times a day (QID) | INTRAMUSCULAR | Status: AC
  Administered 2012-10-04 (×2): 15 mg via INTRAVENOUS
  Filled 2012-10-04 (×2): qty 1

## 2012-10-04 MED ORDER — LEVOTHYROXINE SODIUM 150 MCG PO CAPS
1.0000 | ORAL_CAPSULE | Freq: Every day | ORAL | Status: DC
  Administered 2012-10-05: 150 ug via ORAL
  Filled 2012-10-04 (×2): qty 1

## 2012-10-04 MED ORDER — ARTIFICIAL TEARS OP OINT
TOPICAL_OINTMENT | OPHTHALMIC | Status: DC | PRN
  Administered 2012-10-04: 1 via OPHTHALMIC

## 2012-10-04 MED ORDER — NEOSTIGMINE METHYLSULFATE 1 MG/ML IJ SOLN
INTRAMUSCULAR | Status: DC | PRN
  Administered 2012-10-04: 3 mg via INTRAVENOUS

## 2012-10-04 MED ORDER — RIVAROXABAN 10 MG PO TABS
10.0000 mg | ORAL_TABLET | Freq: Every day | ORAL | Status: DC
  Administered 2012-10-04 – 2012-10-06 (×3): 10 mg via ORAL
  Filled 2012-10-04 (×3): qty 1

## 2012-10-04 MED ORDER — ALUM & MAG HYDROXIDE-SIMETH 200-200-20 MG/5ML PO SUSP: 30.0000 mL | ORAL | Status: DC | PRN

## 2012-10-04 MED ORDER — DOCUSATE SODIUM 100 MG PO CAPS
100.0000 mg | ORAL_CAPSULE | Freq: Two times a day (BID) | ORAL | Status: DC
  Administered 2012-10-04 – 2012-10-06 (×5): 100 mg via ORAL
  Filled 2012-10-04 (×6): qty 1

## 2012-10-04 MED ORDER — MENTHOL 3 MG MT LOZG: 1.0000 | LOZENGE | OROMUCOSAL | Status: DC | PRN

## 2012-10-04 MED ORDER — METHOCARBAMOL 100 MG/ML IJ SOLN
500.0000 mg | Freq: Four times a day (QID) | INTRAVENOUS | Status: DC | PRN
  Filled 2012-10-04: qty 5

## 2012-10-04 MED ORDER — OXYCODONE HCL 5 MG PO TABS
ORAL_TABLET | ORAL | Status: AC
  Filled 2012-10-04: qty 1

## 2012-10-04 MED ORDER — OXYCODONE HCL 5 MG PO TABS
5.0000 mg | ORAL_TABLET | ORAL | Status: DC | PRN
  Administered 2012-10-04 – 2012-10-06 (×14): 10 mg via ORAL
  Filled 2012-10-04 (×14): qty 2

## 2012-10-04 MED ORDER — BUPIVACAINE-EPINEPHRINE PF 0.25-1:200000 % IJ SOLN
INTRAMUSCULAR | Status: DC | PRN
  Administered 2012-10-04: 30 mL

## 2012-10-04 MED ORDER — BISACODYL 10 MG RE SUPP: 10.0000 mg | Freq: Every day | RECTAL | Status: DC | PRN

## 2012-10-04 MED ORDER — MORPHINE SULFATE 2 MG/ML IJ SOLN
2.0000 mg | INTRAMUSCULAR | Status: DC | PRN
  Administered 2012-10-05: 2 mg via INTRAVENOUS
  Filled 2012-10-04: qty 1

## 2012-10-04 MED ORDER — MEPERIDINE HCL 25 MG/ML IJ SOLN: 6.2500 mg | INTRAMUSCULAR | Status: DC | PRN

## 2012-10-04 MED ORDER — ONDANSETRON HCL 4 MG PO TABS: 4.0000 mg | ORAL_TABLET | Freq: Four times a day (QID) | ORAL | Status: DC | PRN

## 2012-10-04 MED ORDER — ACETAMINOPHEN 650 MG RE SUPP: 650.0000 mg | Freq: Four times a day (QID) | RECTAL | Status: DC | PRN

## 2012-10-04 MED ORDER — PHENOL 1.4 % MT LIQD: 1.0000 | OROMUCOSAL | Status: DC | PRN

## 2012-10-04 MED ORDER — BUPROPION HCL ER (XL) 300 MG PO TB24
300.0000 mg | ORAL_TABLET | Freq: Every day | ORAL | Status: DC
  Administered 2012-10-05 – 2012-10-06 (×2): 300 mg via ORAL
  Filled 2012-10-04 (×3): qty 1

## 2012-10-04 MED ORDER — OXYCODONE HCL 5 MG/5ML PO SOLN: 5.0000 mg | Freq: Once | ORAL | Status: AC | PRN

## 2012-10-04 MED ORDER — PROPOFOL 10 MG/ML IV BOLUS
INTRAVENOUS | Status: DC | PRN
  Administered 2012-10-04: 170 mg via INTRAVENOUS

## 2012-10-04 MED ORDER — CHLORHEXIDINE GLUCONATE 4 % EX LIQD: 60.0000 mL | Freq: Once | CUTANEOUS | Status: DC

## 2012-10-04 MED ORDER — SODIUM CHLORIDE 0.9 % IV SOLN: INTRAVENOUS | Status: DC

## 2012-10-04 SURGICAL SUPPLY — 58 items
BLADE SAW SAG 73X25 THK (BLADE) ×1
BLADE SAW SGTL 73X25 THK (BLADE) ×1 IMPLANT
BRUSH FEMORAL CANAL (MISCELLANEOUS) IMPLANT
CAPT HIP PF MOP ×2 IMPLANT
CLOTH BEACON ORANGE TIMEOUT ST (SAFETY) IMPLANT
COVER BACK TABLE 24X17X13 BIG (DRAPES) IMPLANT
COVER SURGICAL LIGHT HANDLE (MISCELLANEOUS) ×2 IMPLANT
DRAPE INCISE IOBAN 66X45 STRL (DRAPES) ×2 IMPLANT
DRAPE ORTHO SPLIT 77X108 STRL (DRAPES) ×2
DRAPE SURG ORHT 6 SPLT 77X108 (DRAPES) ×2 IMPLANT
DRSG MEPILEX BORDER 4X12 (GAUZE/BANDAGES/DRESSINGS) ×2 IMPLANT
DRSG MEPILEX BORDER 4X8 (GAUZE/BANDAGES/DRESSINGS) ×2 IMPLANT
DURAPREP 26ML APPLICATOR (WOUND CARE) ×4 IMPLANT
ELECT BLADE 6.5 EXT (BLADE) ×2 IMPLANT
ELECT REM PT RETURN 9FT ADLT (ELECTROSURGICAL) ×2
ELECTRODE REM PT RTRN 9FT ADLT (ELECTROSURGICAL) ×1 IMPLANT
EVACUATOR 1/8 PVC DRAIN (DRAIN) IMPLANT
FACESHIELD LNG OPTICON STERILE (SAFETY) ×4 IMPLANT
GLOVE BIOGEL PI IND STRL 8 (GLOVE) ×2 IMPLANT
GLOVE BIOGEL PI IND STRL 8.5 (GLOVE) ×1 IMPLANT
GLOVE BIOGEL PI INDICATOR 8 (GLOVE) ×2
GLOVE BIOGEL PI INDICATOR 8.5 (GLOVE) ×1
GLOVE ECLIPSE 8.0 STRL XLNG CF (GLOVE) ×2 IMPLANT
GLOVE SURG ORTHO 8.5 STRL (GLOVE) ×4 IMPLANT
GOWN PREVENTION PLUS XXLARGE (GOWN DISPOSABLE) ×2 IMPLANT
GOWN STRL NON-REIN LRG LVL3 (GOWN DISPOSABLE) ×6 IMPLANT
HANDPIECE INTERPULSE COAX TIP (DISPOSABLE)
IMMOBILIZER KNEE 20 (SOFTGOODS)
IMMOBILIZER KNEE 20 THIGH 36 (SOFTGOODS) IMPLANT
IMMOBILIZER KNEE 22 UNIV (SOFTGOODS) IMPLANT
IMMOBILIZER KNEE 24 THIGH 36 (MISCELLANEOUS) IMPLANT
IMMOBILIZER KNEE 24 UNIV (MISCELLANEOUS)
KIT BASIN OR (CUSTOM PROCEDURE TRAY) ×2 IMPLANT
KIT ROOM TURNOVER OR (KITS) ×2 IMPLANT
MANIFOLD NEPTUNE II (INSTRUMENTS) ×2 IMPLANT
NEEDLE 22X1 1/2 (OR ONLY) (NEEDLE) ×2 IMPLANT
NS IRRIG 1000ML POUR BTL (IV SOLUTION) ×2 IMPLANT
PACK TOTAL JOINT (CUSTOM PROCEDURE TRAY) ×2 IMPLANT
PAD ARMBOARD 7.5X6 YLW CONV (MISCELLANEOUS) ×4 IMPLANT
PRESSURIZER FEMORAL UNIV (MISCELLANEOUS) IMPLANT
SET HNDPC FAN SPRY TIP SCT (DISPOSABLE) IMPLANT
STAPLER VISISTAT 35W (STAPLE) ×2 IMPLANT
SUCTION FRAZIER TIP 10 FR DISP (SUCTIONS) ×2 IMPLANT
SUT BONE WAX W31G (SUTURE) ×2 IMPLANT
SUT ETHIBOND NAB CT1 #1 30IN (SUTURE) ×8 IMPLANT
SUT MNCRL AB 3-0 PS2 18 (SUTURE) ×2 IMPLANT
SUT VIC AB 0 CT1 27 (SUTURE) ×2
SUT VIC AB 0 CT1 27XBRD ANBCTR (SUTURE) ×2 IMPLANT
SUT VIC AB 1 CT1 27 (SUTURE) ×3
SUT VIC AB 1 CT1 27XBRD ANBCTR (SUTURE) ×3 IMPLANT
SUT VIC AB 2-0 CT1 27 (SUTURE) ×1
SUT VIC AB 2-0 CT1 TAPERPNT 27 (SUTURE) ×1 IMPLANT
SYR CONTROL 10ML LL (SYRINGE) ×2 IMPLANT
TOWEL OR 17X24 6PK STRL BLUE (TOWEL DISPOSABLE) ×2 IMPLANT
TOWEL OR 17X26 10 PK STRL BLUE (TOWEL DISPOSABLE) ×2 IMPLANT
TOWER CARTRIDGE SMART MIX (DISPOSABLE) IMPLANT
TRAY FOLEY CATH 16FRSI W/METER (SET/KITS/TRAYS/PACK) ×2 IMPLANT
WATER STERILE IRR 1000ML POUR (IV SOLUTION) ×4 IMPLANT

## 2012-10-04 NOTE — Anesthesia Preprocedure Evaluation (Addendum)
Anesthesia Evaluation  Patient identified by MRN, date of birth, ID band Patient awake    Reviewed: Allergy & Precautions, H&P , NPO status , Patient's Chart, lab work & pertinent test results  Airway Mallampati: I TM Distance: >3 FB Neck ROM: Full    Dental  (+) Teeth Intact and Dental Advisory Given   Pulmonary asthma ,          Cardiovascular Rhythm:Regular Rate:Normal     Neuro/Psych    GI/Hepatic   Endo/Other  Hypothyroidism   Renal/GU      Musculoskeletal   Abdominal   Peds  Hematology   Anesthesia Other Findings   Reproductive/Obstetrics                          Anesthesia Physical Anesthesia Plan  ASA: II  Anesthesia Plan: General   Post-op Pain Management:    Induction: Intravenous  Airway Management Planned: Oral ETT  Additional Equipment:   Intra-op Plan:   Post-operative Plan: Extubation in OR  Informed Consent: I have reviewed the patients History and Physical, chart, labs and discussed the procedure including the risks, benefits and alternatives for the proposed anesthesia with the patient or authorized representative who has indicated his/her understanding and acceptance.     Plan Discussed with: CRNA and Surgeon  Anesthesia Plan Comments:         Anesthesia Quick Evaluation

## 2012-10-04 NOTE — H&P (Signed)
  The recent History & Physical has been reviewed. I have personally examined the patient today. There is no interval change to the documented History & Physical. The patient would like to proceed with the procedure.  [name] W 10/04/2012,  9:59 AM

## 2012-10-04 NOTE — H&P (Signed)
CHIEF COMPLAINT: Painful right hip.  HISTORY OF PRESENT ILLNESS: April [name] is a very pleasant 57 year old white female who is seen today for evaluation of her right hip. She had a hip replacement done on the left about 2 or so years ago for avascular necrosis. She has had chronic AVN of the right hip prior to that surgery but did not have any progressive symptoms in the right hip until most recently. She is now having pain in the groin and also in the lateral aspect and posterior aspect of the proximal femur. She denies any radicular buttock-type pain. Her pain, unfortunately, has worsened to the point where she has to use a cane. No recent history of injury or trauma. Most everything she uses over-the-counter is not very productive. She does use tramadol for pain. She is also using some anti-inflammatories in addition to the Ultram. She did have an MRI scan performed which revealed significant marrow edema surrounding the areas of the chronic AVN, extending down into the femoral neck in the area of the trochanteric region. The femoral head is also now slightly flattened. Findings consistent with a new/active osteonecrosis process, likely accounting for the patient's new hip pain. There is also an associated reactive joint effusion. She has gotten to the point now where she cannot take this and would like to consider possible total hip replacement.  PAST MEDICAL HISTORY: In general, her heath is good.  PAST SURGICAL HISTORY: Tonsillectomy and adenoidectomy in 1968, laparoscopy in 1980. Surgery for adhesions and endometriosis March 1981. Gallbladder removed in 1981. Breast biopsy on the left, negative for cancer, in 1990. Hysterectomy March 1991. Pelvic incontinence surgery with mesh pelvic sling in 2007. Cardiac cath in 2007. March 2012 left wrist proximal row carpectomy. June 2012 left total hip arthroplasty. Childbirth 1983.  MEDICATIONS: Levothyroxine 125 mcg daily, bupropion HCl XL 300 mg daily, Advair 250/50  Diskus 2 puffs daily, Ventolin inhaler 90 mcg p.r.n., alprazolam 50 mg p.r.n., calcium 600 mg 2 daily, vitamin D3 2000 IU daily, biotin 5000 mg daily, vitamin C 1000 mg daily, vitamin B 1000 mg daily, multivitamin 1 daily, methocarbamol 500 mg up to t.i.d. p.r.n. spasms, tramadol 50 mg q. 4 to 6 hours p.r.n. pain.  ALLERGIES: SULFA. She is not allergic to DILAUDID but apparently had some emesis postop the following day after surgery, and it was felt that maybe that was the reason for it.  FAMILY HISTORY: Positive for her mother who died at age 39 of renal failure on dialysis; had heart disease, hypertension, diabetes, and renal disease. Father who died in an accident at age 21. One brother who died at age 35 from suicide, another one who is still living at age 30 and is in good health. No sisters.  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 She quit smoking cigarettes in 2012, but she still smokes 3 to 4 cigarettes per day and an e-cigarette. She previously had smoked 1 to 1-1/2 packs per day for 34 years. She does occasionally have drinks of 1 to 2 at a time.  REVIEW OF SYSTEMS: A 14-review of systems is negative except for glasses, hoarseness, and occasional shortness of breath. She does not have much of the shoulder since she has been on Advair. It was felt that she has chronic bronchitis and possible COPD on the basis of cigarette smoking and chronic bronchitis. She did have palpitations, but it was found that she was  hyperthyroid but did undergo a cardiac cath in 2007 which was normal. She did have anemia with her childbirth. The thyroid does have hyperthyroidism and was ablated with I-131. Occasional ankle swelling is noted. She urinates 1 to 2 times per evening. Occasional orthostatic dizziness. She does have tension headaches and apparently did have temporal arteritis which was another reason for that. She also has nervous anxiety  and depression.  PHYSICAL EXAMINATION: Today reveals a 57 year old white female, well developed, well nourished, alert, pleasant, cooperative, in moderate distress secondary to right hip pain. Height is 5 feet 6 inches. Weight is 178 pounds. BMI is 28.7. Vital signs reveal a temperature of 97.6, pulse 108, respirations 16, blood pressure 144/93. Head: Normocephalic. Eyes: Pupils equal, round, and reactive to light and accommodation with extraocular movements intact. Ears/Nose/Throat: Benign. Neck: Supple. No thyromegaly. Chest: Had good expansion. Lungs: Essentially clear. Cardiac: Had regular rhythm and rate. Normal S1, S2. No discrete murmurs, rubs, or gallops are appreciated. She is mildly tachycardic. Pulses were 2+, bilateral and symmetric in the lower extremities. Abdomen: Is scaphoid, soft, nontender. No masses palpable. Normal bowel sounds are present. Genital/Rectal/Breast: Not indicated for an orthopedic procedure. CNS: She oriented x3, and cranial nerves II through XII grossly intact. Musculoskeletal: She has markedly decreased range of motion of the right hip with internal and external rotation and flexion. She sits comfortably at 90 degrees of flexion.  IMAGING: MRI scan as noted above in the HPI.  CLINICAL IMPRESSION:  1. Avascular necrosis of the right femoral head.  2. Status post left total hip arthroplasty for AVN of the left hip.  3. Chronic bronchitis with questionable COPD.  4. Cigarette smoker.  5. History of hyperthyroidism.  6. Tension headaches.  7. Depression and anxiety.  RECOMMENDATIONS: At this time our plan is to see if we can proceed with a total hip replacement. I have reviewed information sent by Dr. Megan Salon, and she feels that she is cleared for surgery from a medical and cardiac standpoint. We are to continue her inhaler for COPD as well as levothyroxine. At this time our plan is to proceed with surgery. Procedure, risks, and benefits were explained to her again in  detail, and she is understanding of that. We will plan in the neat future for a right total hip replacement.    Oris Drone Aleda Grana [facility] [phone]  10/04/2012 10:07 AM

## 2012-10-04 NOTE — Progress Notes (Signed)
Utilization Review Completed.Hibba Schram T9/30/2014  

## 2012-10-04 NOTE — Anesthesia Postprocedure Evaluation (Signed)
Anesthesia Post Note  Patient: April [name]  Procedure(s) Performed: Procedure(s) (LRB): TOTAL HIP ARTHROPLASTY (Right)  Anesthesia type: general  Patient location: PACU  Post pain: Pain level controlled  Post assessment: Patient's Cardiovascular Status Stable  Last Vitals:  Filed Vitals:   10/04/12 1313  BP: 122/65  Pulse: 79  Temp:   Resp: 9    Post vital signs: Reviewed and stable  Level of consciousness: sedated  Complications: No apparent anesthesia complications

## 2012-10-04 NOTE — Plan of Care (Signed)
Problem: Consults Goal: Diagnosis- Total Joint Replacement Primary Total Hip Right     

## 2012-10-04 NOTE — Anesthesia Procedure Notes (Signed)
Procedure Name: Intubation Date/Time: 10/04/2012 10:38 AM Performed by: De Nurse Pre-anesthesia Checklist: Patient identified, Emergency Drugs available, Suction available, Patient being monitored and Timeout performed Patient Re-evaluated:Patient Re-evaluated prior to inductionOxygen Delivery Method: Circle system utilized Preoxygenation: Pre-oxygenation with 100% oxygen Intubation Type: IV induction Ventilation: Mask ventilation without difficulty Laryngoscope Size: Mac and 3 Grade View: Grade I Tube type: Oral Tube size: 7.0 mm Number of attempts: 1 Airway Equipment and Method: Stylet Placement Confirmation: ETT inserted through vocal cords under direct vision,  positive ETCO2 and breath sounds checked- equal and bilateral Secured at: 21 cm Tube secured with: Tape Dental Injury: Teeth and Oropharynx as per pre-operative assessment

## 2012-10-04 NOTE — H&P (Signed)
  PATIENT ID:      April [name]  MRN:     [id number] DOB/AGE:    Nov 19, 1955 / 57 y.o.       OPERATIVE REPORT    DATE OF PROCEDURE:  10/04/2012       PREOPERATIVE DIAGNOSIS:   Avascular Necrosis Right Femoral Head                                                       Estimated body mass index is 27.83 kg/(m^2) as calculated from the following:   Height as of 09/29/12: 5' 6.5" (1.689 m).   Weight as of 04/06/11: 79.379 kg (175 lb).     POSTOPERATIVE DIAGNOSIS:   avascular necrosis right femoral head                                                                     Estimated body mass index is 27.83 kg/(m^2) as calculated from the following:   Height as of 09/29/12: 5' 6.5" (1.689 m).   Weight as of 04/06/11: 79.379 kg (175 lb).     PROCEDURE:  Procedure(s): TOTAL HIP ARTHROPLASTY right     SURGEON:  [name], MD    ASSISTANT:   Jacqualine Code, PA-C   (Present and scrubbed throughout the case, critical for assistance with exposure, retraction, instrumentation, and closure.)          ANESTHESIA: general     DRAINS: none :      TOURNIQUET TIME: * No tourniquets in log *    COMPLICATIONS:  None   CONDITION:  stable  PROCEDURE IN [id number]    [name], [name] 10/04/2012, 12:20 PM

## 2012-10-04 NOTE — Transfer of Care (Signed)
Immediate Anesthesia Transfer of Care Note  Patient: April [name]  Procedure(s) Performed: Procedure(s): TOTAL HIP ARTHROPLASTY (Right)  Patient Location: PACU  Anesthesia Type:General  Level of Consciousness: awake, alert  and oriented  Airway & Oxygen Therapy: Patient Spontanous Breathing and Patient connected to nasal cannula oxygen  Post-op Assessment: Report given to PACU RN  Post vital signs: Reviewed and stable  Complications: No apparent anesthesia complications

## 2012-10-04 NOTE — Preoperative (Signed)
Beta Blockers   Reason not to administer Beta Blockers:Not Applicable 

## 2012-10-05 LAB — BASIC METABOLIC PANEL
BUN: 11 mg/dL (ref 6–23)
CO2: 28 mEq/L (ref 19–32)
Creatinine, Ser: 0.99 mg/dL (ref 0.50–1.10)
GFR calc Af Amer: 71 mL/min — ABNORMAL LOW (ref >90)
GFR calc non Af Amer: 61 mL/min — ABNORMAL LOW (ref >90)
Glucose, Bld: 94 mg/dL (ref 70–99)
Potassium: 3.5 mEq/L (ref 3.5–5.1)
Sodium: 135 mEq/L (ref 135–145)

## 2012-10-05 LAB — CBC
HCT: 28.8 % — ABNORMAL LOW (ref 36.0–46.0)
Hemoglobin: 9.9 g/dL — ABNORMAL LOW (ref 12.0–15.0)
MCHC: 34.4 g/dL (ref 30.0–36.0)
MCV: 91.1 fL (ref 78.0–100.0)
RDW: 14 % (ref 11.5–15.5)

## 2012-10-05 MED ORDER — LEVOTHYROXINE SODIUM 150 MCG PO TABS
150.0000 ug | ORAL_TABLET | Freq: Every day | ORAL | Status: DC
  Administered 2012-10-06: 150 ug via ORAL
  Filled 2012-10-05 (×2): qty 1

## 2012-10-05 MED FILL — Levothyroxine Sodium Tab 150 MCG: ORAL | Qty: 1 | Status: AC

## 2012-10-05 NOTE — Evaluation (Signed)
Occupational Therapy Evaluation  Patient Details Name: April [name] MRN: [id number] DOB: 07/05/1955 Today's Date: 10/05/2012 Time: 4098-1191 OT Time Calculation (min): 33 min  OT Assessment / Plan / Recommendation History of present illness RTHA--posterior   Clinical Impression   This 57 yo female admitted with above presents to acute OT with problems below. Will benefit from acute OT without need for follow up.    OT Assessment  Patient needs continued OT Services    Follow Up Recommendations  No OT follow up       Equipment Recommendations  None recommended by OT       Frequency  Min 2X/week    Precautions / Restrictions Precautions Precautions: Posterior Hip;Fall Precaution Comments: Pt able to remember 2/3 hip precautions from last surgery on other hip 2 years ago--could not remember no crossing Required Braces or Orthoses: Knee Immobilizer - Right Knee Immobilizer - Right:  (in bed) Restrictions RLE Weight Bearing: Weight bearing as tolerated   Pertinent Vitals/Pain 3/10, no intervention needed    ADL  Eating/Feeding: Independent Where Assessed - Eating/Feeding: Chair Grooming: Set up Where Assessed - Grooming: Unsupported standing Upper Body Bathing: Set up Where Assessed - Upper Body Bathing: Unsupported sitting Lower Body Bathing: Maximal assistance Where Assessed - Lower Body Bathing: Supported sit to stand Upper Body Dressing: Set up Where Assessed - Upper Body Dressing: Unsupported sitting Lower Body Dressing: +1 Total assistance Where Assessed - Lower Body Dressing: Unsupported sit to stand Toilet Transfer: Min Pension scheme manager Method: Sit to Barista: Bedside commode (over toilet) Toileting - Clothing Manipulation and Hygiene: Minimal assistance Where Assessed - Engineer, mining and Hygiene: Sit to stand from 3-in-1 or toilet Equipment Used: Gait belt;Rolling walker Transfers/Ambulation Related to ADLs:  Min guard A for all with RW    OT Diagnosis: Generalized weakness;Acute pain  OT Problem List: Decreased knowledge of precautions;Pain;Decreased knowledge of use of DME or AE;Impaired balance (sitting and/or standing) OT Treatment Interventions: Self-care/ADL training;DME and/or AE instruction;Patient/family education;Balance training;Therapeutic activities   OT Goals(Current goals can be found in the care plan section) Acute Rehab OT Goals Patient Stated Goal: Go home tomorrow OT Goal Formulation: With patient Time For Goal Achievement: 10/12/12 Potential to Achieve Goals: Good  Visit Information  Last OT Received On: 10/05/12 Assistance Needed: +1 PT/OT Co-Evaluation/Treatment: Yes History of Present Illness: RTHA--posterior       Prior Functioning     Home Living Family/patient expects to be discharged to:: Private residence Living Arrangements: Spouse/significant other;Non-relatives/Friends Available Help at Discharge: Family;Friend(s);Available 24 hours/day Type of Home: House Home Access: Level entry Home Layout: One level Home Equipment: Tub bench;Walker - 2 wheels;Bedside commode Prior Function Level of Independence: Independent Communication Communication: No difficulties Dominant Hand: Left         Vision/Perception Vision - History Baseline Vision:  (contacts all the time) Patient Visual Report: No change from baseline   Cognition  Cognition Arousal/Alertness: Awake/alert Behavior During Therapy: WFL for tasks assessed/performed Overall Cognitive Status: Within Functional Limits for tasks assessed    Extremity/Trunk Assessment Upper Extremity Assessment Upper Extremity Assessment: Overall WFL for tasks assessed     Mobility Bed Mobility Bed Mobility: Supine to Sit;Sitting - Scoot to Edge of Bed Supine to Sit: 4: Min assist;With rails;HOB elevated Sitting - Scoot to Edge of Bed: 4: Min guard;With rail Details for Bed Mobility Assistance: VCs for  sequence Transfers Transfers: Sit to Stand;Stand to Sit Sit to Stand: 4: Min guard;With upper extremity assist;From bed  Stand to Sit: 4: Min guard;With upper extremity assist;With armrests;To chair/3-in-1 Details for Transfer Assistance: VCs for safe hand placement and to put RLE out           End of Session OT - End of Session Equipment Utilized During Treatment: Gait belt;Rolling walker Activity Tolerance: Patient tolerated treatment well Patient left: in chair;with call bell/phone within reach       Evette Georges  [phone]  10/05/2012, 10:25 AM

## 2012-10-05 NOTE — Progress Notes (Signed)
Physical Therapy Treatment Patient Details Name: April [name] MRN: [id number] DOB: [birth year] Today's Date: 10/05/2012 Time: 4098-1191 PT Time Calculation (min): 27 min  PT Assessment / Plan / Recommendation  History of Present Illness RTHA--posterior   PT Comments   Pt progressing well with mobility.Reviewed HEP with pt and pt was able to incr amb distance this session with supervision to min guard (A). Anticipate D/C tomorrow if medically stable.   Follow Up Recommendations  Home health PT;Supervision/Assistance - 24 hour     Does the patient have the potential to tolerate intense rehabilitation     Barriers to Discharge   none    Equipment Recommendations  None recommended by PT    Recommendations for Other Services    Frequency 7X/week   Progress towards PT Goals Progress towards PT goals: Progressing toward goals  Plan Current plan remains appropriate    Precautions / Restrictions Precautions Precautions: Posterior Hip;Fall Precaution Booklet Issued: Yes (comment) Precaution Comments: pt able to recall 2/3; min cues during session to adhere  Required Braces or Orthoses: Knee Immobilizer - Right Knee Immobilizer - Right: Other (comment) (in bed) Restrictions Weight Bearing Restrictions: Yes RLE Weight Bearing: Weight bearing as tolerated   Pertinent Vitals/Pain 4/10; pt premedicated and patient repositioned for comfort in bed     Mobility  Bed Mobility Bed Mobility: Sit to Supine Supine to Sit: 4: Min assist;With rails;HOB elevated Sitting - Scoot to Edge of Bed: 4: Min guard;With rail Sit to Supine: 4: Min assist;HOB elevated;With rail Details for Bed Mobility Assistance: (A) to advance Rt LE onto bed; demo technique to use sheet and UEs to (A) Rt LE; pt unable to do so today due to pain and fatigue Transfers Transfers: Sit to Stand;Stand to Sit Sit to Stand: 4: Min guard;From chair/3-in-1;With armrests Stand to Sit: 4: Min guard;To bed;To elevated  surface Details for Transfer Assistance: min guard to steady and for safety; vc's for hand placement and to adhere to hip precautions  Ambulation/Gait Ambulation/Gait Assistance: 5: Supervision Ambulation Distance (Feet): 70 Feet Assistive device: Rolling walker Ambulation/Gait Assistance Details: cues for gt sequencing to increase step length on Lt LE to incr WB on Rt LE; pt beginning to progress to step through gt; pt required 2 standing rest breaks due to fatigue in UEs  Gait Pattern: Decreased stance time - right;Decreased step length - left;Trunk flexed Gait velocity: decreased  Stairs: No Wheelchair Mobility Wheelchair Mobility: No    Exercises Total Joint Exercises Ankle Circles/Pumps: AROM;Both;10 reps;Seated Quad Sets: AROM;Right;10 reps Gluteal Sets: 10 reps Short Arc Quad: AROM;Right;10 reps Hip ABduction/ADduction: AAROM;Right;10 reps (pt C/O pain) Long Arc Quad: AROM;Right;10 reps (cues to slow down and isolate proper muscle isolation )   PT Diagnosis: Difficulty walking;Acute pain  PT Problem List: Decreased strength;Decreased range of motion;Decreased mobility;Decreased balance;Decreased knowledge of precautions;Decreased knowledge of use of DME;Pain PT Treatment Interventions: Gait training;DME instruction;Functional mobility training;Therapeutic activities;Therapeutic exercise;Balance training;Neuromuscular re-education;Patient/family education   PT Goals (current goals can now be found in the care plan section) Acute Rehab PT Goals Patient Stated Goal: Go home tomorrow PT Goal Formulation: With patient Time For Goal Achievement: 10/12/12 Potential to Achieve Goals: Good  Visit Information  Last PT Received On: 10/05/12 Assistance Needed: +1 History of Present Illness: RTHA--posterior    Subjective Data  Subjective: pt sitting in chair " im about ready for my nap so im glad you came in"  Patient Stated Goal: Go home tomorrow   Cognition   Cognition Arousal/Alertness: Awake/alert  Behavior During Therapy: WFL for tasks assessed/performed Overall Cognitive Status: Within Functional Limits for tasks assessed    Balance  Balance Balance Assessed: No Static Sitting Balance Static Sitting - Balance Support: Bilateral upper extremity supported;Feet supported Static Sitting - Level of Assistance: 5: Stand by assistance  End of Session PT - End of Session Equipment Utilized During Treatment: Gait belt Activity Tolerance: Patient tolerated treatment well Patient left: in bed;with call bell/phone within reach (in knee immobilizer) Nurse Communication: Mobility status   GP     Donell Sievert, [place] [phone] 10/05/2012, 2:28 PM

## 2012-10-05 NOTE — Evaluation (Signed)
Physical Therapy Evaluation Patient Details Name: April [name] MRN: [id number] DOB: 1955/05/30 Today's Date: 10/05/2012 Time: 7829-5621 PT Time Calculation (min): 35 min  PT Assessment / Plan / Recommendation History of Present Illness  RTHA--posterior  Clinical Impression  Pt is s/p Rt posterior THA POD#1 resulting in the deficits listed below (see PT Problem List). Pt will benefit from skilled PT to increase their independence and safety with mobility to allow discharge to the venue listed below. Anticipate good progress. Pt highly motivated to return home with husband tomorrow.      PT Assessment  Patient needs continued PT services    Follow Up Recommendations  Home health PT;Supervision/Assistance - 24 hour    Does the patient have the potential to tolerate intense rehabilitation      Barriers to Discharge   none    Equipment Recommendations  None recommended by PT    Recommendations for Other Services     Frequency 7X/week    Precautions / Restrictions Precautions Precautions: Posterior Hip;Fall Precaution Booklet Issued: Yes (comment) Precaution Comments: Pt able to remember 2/3 hip precautions from last surgery on other hip 2 years ago--could not remember no crossing Required Braces or Orthoses: Knee Immobilizer - Right Knee Immobilizer - Right: Other (comment) (in bed for hip precautions) Restrictions Weight Bearing Restrictions: Yes RLE Weight Bearing: Weight bearing as tolerated   Pertinent Vitals/Pain 6/10; premedicated. patient repositioned for comfort       Mobility  Bed Mobility Bed Mobility: Supine to Sit;Sitting - Scoot to Edge of Bed Supine to Sit: 4: Min assist;With rails;HOB elevated Sitting - Scoot to Edge of Bed: 4: Min guard;With rail Details for Bed Mobility Assistance: VCs for sequence and to adhere to hip precautions Transfers Transfers: Sit to Stand;Stand to Sit Sit to Stand: 4: Min guard;With upper extremity assist;From bed Stand  to Sit: 4: Min guard;With upper extremity assist;With armrests;To chair/3-in-1 Details for Transfer Assistance: vc's for hand placement and safety with RW and to adhere to hip precautions  Ambulation/Gait Ambulation/Gait Assistance: 4: Min guard Ambulation Distance (Feet): 60 Feet Assistive device: Rolling walker Ambulation/Gait Assistance Details: cues for gt sequencing and upright posture; min cues to adhere to hip precautions with turns  Gait Pattern: Decreased stance time - right;Decreased step length - left;Trunk flexed Gait velocity: decreased  Stairs: No Wheelchair Mobility Wheelchair Mobility: No    Exercises Total Joint Exercises Ankle Circles/Pumps: AROM;Both;10 reps Quad Sets: AROM;Right;10 reps Gluteal Sets: 10 reps   PT Diagnosis: Difficulty walking;Acute pain  PT Problem List: Decreased strength;Decreased range of motion;Decreased mobility;Decreased balance;Decreased knowledge of precautions;Decreased knowledge of use of DME;Pain PT Treatment Interventions: Gait training;DME instruction;Functional mobility training;Therapeutic activities;Therapeutic exercise;Balance training;Neuromuscular re-education;Patient/family education     PT Goals(Current goals can be found in the care plan section) Acute Rehab PT Goals Patient Stated Goal: Go home tomorrow PT Goal Formulation: With patient Time For Goal Achievement: 10/12/12 Potential to Achieve Goals: Good  Visit Information  Last PT Received On: 10/05/12 Assistance Needed: +1 History of Present Illness: RTHA--posterior       Prior Functioning  Home Living Family/patient expects to be discharged to:: Private residence Living Arrangements: Spouse/significant other;Non-relatives/Friends Available Help at Discharge: Family;Friend(s);Available 24 hours/day Type of Home: House Home Access: Level entry Home Layout: One level Home Equipment: Tub bench;Walker - 2 wheels;Bedside commode Prior Function Level of  Independence: Independent Communication Communication: No difficulties Dominant Hand: Left    Cognition  Cognition Arousal/Alertness: Awake/alert Behavior During Therapy: WFL for tasks assessed/performed Overall Cognitive Status:  Within Functional Limits for tasks assessed    Extremity/Trunk Assessment Upper Extremity Assessment Upper Extremity Assessment: Defer to OT evaluation Lower Extremity Assessment RLE Deficits / Details: Rt knee grossly 3/5 hip limited by pain  Cervical / Trunk Assessment Cervical / Trunk Assessment: Normal   Balance Balance Balance Assessed: Yes Static Sitting Balance Static Sitting - Balance Support: Bilateral upper extremity supported;Feet supported Static Sitting - Level of Assistance: 5: Stand by assistance  End of Session PT - End of Session Equipment Utilized During Treatment: Gait belt Activity Tolerance: Patient tolerated treatment well Patient left: in chair;with call bell/phone within reach Nurse Communication: Mobility status  GP     Donell Sievert, [place] [phone] 10/05/2012, 10:35 AM

## 2012-10-05 NOTE — Progress Notes (Signed)
Patient ID: April [name], female   DOB: 10/27/1955, 57 y.o.   MRN: [id number] PATIENT ID: April [name]        MRN:  [id number]          DOB/AGE: 1955-11-21 / 57 y.o.    [name], MD   Jacqualine Code, PA-C [address] [place], [place]  [address]                             [phone]   PROGRESS NOTE  Subjective:  negative for Chest Pain  negative for Shortness of Breath  negative for Nausea/Vomiting   negative for Calf Pain    Tolerating Diet: yes         Patient reports pain as moderate.     Not much sleep last night, but no nausea-comfortable at present  Objective: Vital signs in last 24 hours:   Patient Vitals for the past 24 hrs:  BP Temp Temp src Pulse Resp SpO2  10/05/12 0516 117/64 mmHg 98.6 F (37 C) - 88 18 97 %  10/05/12 0323 - - - - 17 94 %  10/05/12 0125 117/64 mmHg 98.5 F (36.9 C) - 88 18 94 %  10/04/12 2346 - - - - 17 98 %  10/04/12 2058 126/60 mmHg 98.9 F (37.2 C) - 79 17 92 %  10/04/12 2052 - - - - - 97 %  10/04/12 2000 - - - - 17 97 %  10/04/12 1840 119/69 mmHg 97.9 F (36.6 C) - 73 18 94 %  10/04/12 1600 - - - - 13 99 %  10/04/12 1459 124/70 mmHg 97.7 F (36.5 C) - 84 18 98 %  10/04/12 1440 107/67 mmHg - - 66 9 98 %  10/04/12 1430 - - - 68 - 98 %  10/04/12 1429 97/63 mmHg - - 68 9 97 %  10/04/12 1415 - 98.1 F (36.7 C) - 75 9 98 %  10/04/12 1413 103/77 mmHg - - 96 22 96 %  10/04/12 1400 - - - 69 8 97 %  10/04/12 1358 107/79 mmHg - - 70 9 97 %  10/04/12 1345 - - - 78 7 97 %  10/04/12 1343 109/75 mmHg - - 75 10 97 %  10/04/12 1342 - - - 72 10 95 %  10/04/12 1330 - - - 71 11 97 %  10/04/12 1328 117/58 mmHg - - 88 12 96 %  10/04/12 1315 - - - 75 8 97 %  10/04/12 1313 122/65 mmHg - - 79 9 97 %  10/04/12 1301 - - - 85 9 95 %  10/04/12 1300 129/65 mmHg - - 82 - 95 %  10/04/12 1245 - - - 82 14 97 %  10/04/12 1244 125/62 mmHg - - 86 15 97 %  10/04/12 1243 - 98 F (36.7 C) - - - -  10/04/12 0846 155/90 mmHg 97.5 F (36.4 C) Oral  92 20 97 %      Intake/Output from previous day:   09/30 0701 - 10/01 0700 In: 3267.5 [P.O.:480; I.V.:2737.5] Out: 1750 [Urine:1500]   Intake/Output this shift:       Intake/Output     09/30 0701 - 10/01 0700 10/01 0701 - 10/02 0700   P.O. 480    I.V. 2737.5    IV Piggyback 50    Total Intake 3267.5     Urine 1500    Blood 250  Total Output 1750     Net +1517.5             LABORATORY DATA:  Recent Labs  09/29/12 1248  WBC 6.6  HGB 14.0  HCT 40.2  PLT 212    Recent Labs  09/29/12 1248  NA 134*  K 4.0  CL 99  CO2 23  BUN 30*  CREATININE 1.17*  GLUCOSE 78  CALCIUM 9.5   Lab Results  Component Value Date   INR 0.99 09/29/2012   INR 0.99 06/12/2010    Recent Radiographic Studies :  Chest 2 View  09/29/2012   CLINICAL DATA:  Preoperative evaluation for right total hip arthroplasty, history coronary artery disease, smoking, asthma  EXAM: CHEST  2 VIEW  COMPARISON:  06/12/2010  FINDINGS: Normal heart size, mediastinal contours, and pulmonary vascularity.  Lungs emphysematous but clear.  No pleural effusion or pneumothorax.  No acute osseous findings.  IMPRESSION: Emphysematous changes consistent with COPD.  No acute abnormalities.   Electronically Signed   By: Ulyses Southward M.D.   On: 09/29/2012 13:27   Dg Pelvis Portable  10/04/2012   CLINICAL DATA:  Postop for right total hip arthroplasty.  EXAM: PORTABLE PELVIS  COMPARISON:  MRI 09/15/2012  FINDINGS: Interval right hip arthroplasty. No acute hardware complication or periprosthetic fracture identified. Surgical clips project lateral to the right-sided arthroplasty.  IMPRESSION: Expected appearance after interval right hip arthroplasty.   Electronically Signed   By: Jeronimo Greaves   On: 10/04/2012 14:33   Dg Hip Portable 1 View Right  10/04/2012   CLINICAL DATA:  Postop for right total hip arthroplasty.  EXAM: PORTABLE RIGHT HIP - 1 VIEW  COMPARISON:  Pelvic films same date and preoperative MRI of 09/15/2012.  FINDINGS:  Single lateral view demonstrates interval right hip arthroplasty. No periprosthetic fracture or other acute complication identified.  IMPRESSION: Expected appearance after right hip arthroplasty.   Electronically Signed   By: Jeronimo Greaves   On: 10/04/2012 14:34     Examination:  General appearance: alert, cooperative and no distress  Wound Exam: clean, dry, intact   Drainage:  None: wound tissue dry  Motor Exam: EHL, FHL, Anterior Tibial and Posterior Tibial Intact  Sensory Exam: Superficial Peroneal, Deep Peroneal and Tibial normal  Vascular Exam: Normal  Assessment:    1 Day Post-Op  Procedure(s) (LRB): TOTAL HIP ARTHROPLASTY (Right)  ADDITIONAL DIAGNOSIS:  Active Problems:   * No active hospital problems. *  no new problems   Plan: Physical Therapy as ordered Weight Bearing as Tolerated (WBAT)  DVT Prophylaxis:  Xarelto  DISCHARGE PLAN: Home  DISCHARGE NEEDS: HHPT  OOB with PT,KVO IV-check labs    Valeria Batman 10/05/2012 7:40 AM

## 2012-10-05 NOTE — Op Note (Signed)
April [name], April [name]              ACCOUNT NO.:  [id number]  MEDICAL RECORD NO.:  [id number]  LOCATION:  [id number]                        FACILITY:  [facility]  PHYSICIAN:  Claude Manges. Taralynn Quiett, M.D.DATE OF BIRTH:  15-Feb-1955  DATE OF PROCEDURE:  10/04/2012 DATE OF DISCHARGE:                              OPERATIVE REPORT   PREOPERATIVE DIAGNOSIS:  Avascular necrosis, right femoral head with osteoarthritis, right hip.  POSTOPERATIVE DIAGNOSIS:  Avascular necrosis, right femoral head with osteoarthritis, right hip.  PROCEDURE:  Right total hip replacement.  SURGEON:  Claude Manges. [name], M.D.  ASSISTANT:  Jacqualine Code PA-C  ANESTHESIA:  General.  COMPLICATIONS:  None.  COMPONENTS:  DePuy AML large stature 13.5 mm femoral component, a 36 mm outer diameter hip ball with a +1.5 mm neck length, 52 mm outer diameter, Gription sector 3 metallic acetabular component with apex hole eliminator, and a Marathon polyethylene liner.  Components were press- fit.  PROCEDURE:  April [name] was met in the holding area, identified the right hip was appropriate operative site and answered any questions.  She was then transported to room #7 and placed under general anesthesia without difficulty.  Nursing staff inserted a Foley catheter.  Urine was clear.  The patient was then placed in the lateral decubitus position with the right side up and secured to the operating table with the innominate hip system.  The right hip was then prepped from iliac crest to about the midcalf with a chlorhexidine scrub and then DuraPrep.  Sterile draping was performed.  Time-out was called.  A routine Southern incision was utilized and via sharp dissection, carried down to subcutaneous tissue.  Abundant adipose tissue was then incised to the level of the iliotibial band.  A self-retaining retractors were inserted.  The IT band was then incised along the length of the incision.  Retractors were placed more  deeply.  Short external rotators were identified.  They were carefully incised from the attachment to the posterior trochanter.  Tendinous structures were tagged with 0 Ethibond suture.  The capsule was identified and incised along the femoral neck and head.  The joint was entered.  There was a large clear yellow joint effusion and obvious beefy-red synovitis.  The head was then delivered posteriorly without difficulty.  Using the calcar guide, an osteotomy was made at the head and neck junction of the femur.  The head was then delivered from the wound.  There was obvious significant avascular necrosis with loss of subchondral bone enlarged tears within the articular cartilage.  Piriformis fossa was identified.  Starter hole was then made.  The canal finder was inserted.  Reaming was performed sequentially to 13 mm to accept a 13.5 mm component.  Rasping was performed to 13.5 mm large component with a very nice fit on the calcar.  Calcar was prepared using the calcar reamer. Level of the cut was approximately a fingerbreadth proximal to the lesser trochanter.  Retractors were then easily placed around the acetabulum.  The labrum was sharply excised with a knife.  Reaming was then performed sequentially to 51 mm to accept a 52 mm component.  I trialed a 86 with a nice rim fit, 52 would not  completely seat.  The Gription sector 3 metallic acetabular component was then impacted in place.  Using the external guide, we inserted the trial polyethylene liner, and then reinserted the 13.5-mm large stature femoral rasp.  We trialed a 36 mm outer diameter hip ball with a 1.5 mm neck length and then reduced the entire construct.  There was no toggling.  Excellent range of motion in flexion, extension, internal and external rotation without subluxation or dislocation.  The leg lengths appeared to be symmetrical to that which was determined preoperatively.  The components were then removed.   The joint was copiously irrigated with saline solution.  The apex hole eliminator was inserted into the acetabular component, followed by the marathon polyethylene liner with a 10 degree posterior hip.  The wound was again irrigated with saline solution.  The large stature 13.5 mm femoral component was then impacted in place.  It was flushed on the calcar, cleaned the [facility] taper neck and applied the 36 mm outer diameter hip ball with a 1.5 mm neck length. We cleared the acetabulum of any soft tissue and then reduced the joint again through a full range of motion with perfect stability and it felt like the leg lengths were at the level.  Wound was again irrigated with saline solution.  The capsule was closed anatomically with interrupted #1 Ethibond.  The short external rotators were closed with similar material.  The iliotibial band was closed with a running 0 Vicryl.  Subcu in several layers with 2-0 Vicryl, 3-0 Monocryl, and then skin clips.  Sterile bulky dressing was applied.  The patient was awoken.  Knee immobilizer applied to the right lower extremity.  The patient was then transported to the operating room stretcher and returned to the operating room, postanesthesia recovery without difficulty.     Claude Manges. [name], M.D.     PWW/MEDQ  D:  10/04/2012  T:  10/05/2012  Job:  [id number]

## 2012-10-06 DIAGNOSIS — M87051 Idiopathic aseptic necrosis of right femur: Secondary | ICD-10-CM | POA: Diagnosis present

## 2012-10-06 LAB — CBC
MCHC: 34.2 g/dL (ref 30.0–36.0)
MCV: 91.1 fL (ref 78.0–100.0)
Platelets: 128 10*3/uL — ABNORMAL LOW (ref 150–400)
RBC: 3.02 MIL/uL — ABNORMAL LOW (ref 3.87–5.11)
WBC: 7.3 10*3/uL (ref 4.0–10.5)

## 2012-10-06 LAB — BASIC METABOLIC PANEL
CO2: 26 mEq/L (ref 19–32)
Chloride: 103 mEq/L (ref 96–112)
Creatinine, Ser: 0.86 mg/dL (ref 0.50–1.10)
Potassium: 3.5 mEq/L (ref 3.5–5.1)
Sodium: 138 mEq/L (ref 135–145)

## 2012-10-06 MED ORDER — METHOCARBAMOL 500 MG PO TABS: 500.0000 mg | ORAL_TABLET | Freq: Four times a day (QID) | ORAL | Status: DC | PRN

## 2012-10-06 MED ORDER — OXYCODONE HCL 5 MG PO TABS: 5.0000 mg | ORAL_TABLET | ORAL | Status: DC | PRN

## 2012-10-06 MED ORDER — ACETAMINOPHEN 325 MG PO TABS: 650.0000 mg | ORAL_TABLET | Freq: Four times a day (QID) | ORAL | Status: DC | PRN

## 2012-10-06 MED ORDER — RIVAROXABAN 10 MG PO TABS: 10.0000 mg | ORAL_TABLET | Freq: Every day | ORAL | Status: DC

## 2012-10-06 NOTE — Progress Notes (Signed)
Occupational Therapy Treatment Patient Details Name: April [name] MRN: [id number] DOB: 1955-08-28 Today's Date: 10/06/2012 Time: 4098-1191 OT Time Calculation (min): 21 min  OT Assessment / Plan / Recommendation  History of present illness RTHA--posterior   OT comments  This 57 yo making progress, husband and pt state that husband will A pt with LBD once AE shown. Pt was interested in the long handled sponge. Acute OT to sign off due to pt to D/C home today.  Follow Up Recommendations  No OT follow up       Equipment Recommendations  None recommended by OT    Recommendations for Other Services    Frequency Min 2X/week   Progress towards OT Goals Progress towards OT goals: Progressing toward goals  Plan Discharge plan remains appropriate    Precautions / Restrictions Precautions Precautions: Posterior Hip Required Braces or Orthoses: Knee Immobilizer - Right Knee Immobilizer - Right:  (in bed) Restrictions RLE Weight Bearing: Weight bearing as tolerated       ADL  Lower Body Dressing: Minimal assistance (with AE) Where Assessed - Lower Body Dressing: Unsupported sit to stand Equipment Used: Reacher;Sock aid;Long-handled shoe horn Transfers/Ambulation Related to ADLs: S sit <>stand ADL Comments: Pt/husband aware they can purchase the AE in the gift shop or any medical supply store. Pt most interested in the long handled sponge--made her aware they did not carry this separately in the gift shop     OT Goals(current goals can now be found in the care plan section)    Visit Information  Last OT Received On: 10/06/12 Assistance Needed: +1 History of Present Illness: RTHA--posterior          Cognition  Cognition Arousal/Alertness: Awake/alert Behavior During Therapy: WFL for tasks assessed/performed Overall Cognitive Status: Within Functional Limits for tasks assessed    Mobility  Transfers Transfers: Sit to Stand;Stand to Sit Sit to Stand: 5: Supervision;With  upper extremity assist;With armrests;From chair/3-in-1 Stand to Sit: 5: Supervision;With upper extremity assist;With armrests;To chair/3-in-1          End of Session OT - End of Session Equipment Utilized During Treatment:  (AE) Activity Tolerance: Patient tolerated treatment well Patient left: in chair;with call bell/phone within reach;with family/visitor present    Evette Georges [phone] 10/06/2012, 2:50 PM

## 2012-10-06 NOTE — Progress Notes (Signed)
Patient ID: April [name], female   DOB: Jun 25, 1955, 57 y.o.   MRN: [id number] PATIENT ID: April [name]        MRN:  [id number]          DOB/AGE: 1955-12-23 / 57 y.o.    [name], MD   Jacqualine Code, PA-C [address] [place], [place]  [address]                             [phone]   PROGRESS NOTE  Subjective:  negative for Chest Pain  negative for Shortness of Breath  negative for Nausea/Vomiting   negative for Calf Pain    Tolerating Diet: yes         Patient reports pain as mild.     Comfortable today  Objective: Vital signs in last 24 hours:   Patient Vitals for the past 24 hrs:  BP Temp Temp src Pulse Resp SpO2  10/06/12 1016 - - - - - 93 %  10/06/12 0544 116/65 mmHg 98.1 F (36.7 C) Oral 100 18 94 %  10/06/12 0400 - - - - 18 -  10/06/12 0000 - - - - 18 -  10/05/12 2127 126/64 mmHg 100.4 F (38 C) Oral 108 18 95 %  10/05/12 2000 - - - - 18 95 %  10/05/12 1600 - - - - 20 -  10/05/12 1400 128/59 mmHg 98.2 F (36.8 C) - 98 16 92 %      Intake/Output from previous day:   10/01 0701 - 10/02 0700 In: 360 [P.O.:360] Out: -    Intake/Output this shift:       Intake/Output     10/01 0701 - 10/02 0700 10/02 0701 - 10/03 0700   P.O. 360    I.V. (mL/kg)     IV Piggyback     Total Intake(mL/kg) 360 (4.5)    Urine (mL/kg/hr)     Blood     Total Output       Net +360          Urine Occurrence 7 x       LABORATORY DATA:  Recent Labs  10/05/12 0650 10/06/12 0342  WBC 7.6 7.3  HGB 9.9* 9.4*  HCT 28.8* 27.5*  PLT 141* 128*    Recent Labs  10/05/12 0650 10/06/12 0342  NA 135 138  K 3.5 3.5  CL 101 103  CO2 28 26  BUN 11 9  CREATININE 0.99 0.86  GLUCOSE 94 95  CALCIUM 8.3* 8.3*   Lab Results  Component Value Date   INR 0.99 09/29/2012   INR 0.99 06/12/2010    Recent Radiographic Studies :  Chest 2 View  09/29/2012   CLINICAL DATA:  Preoperative evaluation for right total hip arthroplasty, history coronary artery disease,  smoking, asthma  EXAM: CHEST  2 VIEW  COMPARISON:  06/12/2010  FINDINGS: Normal heart size, mediastinal contours, and pulmonary vascularity.  Lungs emphysematous but clear.  No pleural effusion or pneumothorax.  No acute osseous findings.  IMPRESSION: Emphysematous changes consistent with COPD.  No acute abnormalities.   Electronically Signed   By: Ulyses Southward M.D.   On: 09/29/2012 13:27   Dg Pelvis Portable  10/04/2012   CLINICAL DATA:  Postop for right total hip arthroplasty.  EXAM: PORTABLE PELVIS  COMPARISON:  MRI 09/15/2012  FINDINGS: Interval right hip arthroplasty. No acute hardware complication or periprosthetic fracture identified. Surgical clips project lateral to  the right-sided arthroplasty.  IMPRESSION: Expected appearance after interval right hip arthroplasty.   Electronically Signed   By: Jeronimo Greaves   On: 10/04/2012 14:33   Dg Hip Portable 1 View Right  10/04/2012   CLINICAL DATA:  Postop for right total hip arthroplasty.  EXAM: PORTABLE RIGHT HIP - 1 VIEW  COMPARISON:  Pelvic films same date and preoperative MRI of 09/15/2012.  FINDINGS: Single lateral view demonstrates interval right hip arthroplasty. No periprosthetic fracture or other acute complication identified.  IMPRESSION: Expected appearance after right hip arthroplasty.   Electronically Signed   By: Jeronimo Greaves   On: 10/04/2012 14:34     Examination:  General appearance: alert, cooperative and no distress  Wound Exam: clean, dry, intact   Drainage:  None: wound tissue dry  Motor Exam: EHL, FHL, Anterior Tibial and Posterior Tibial Intact  Sensory Exam: Superficial Peroneal, Deep Peroneal and Tibial normal  Vascular Exam: Normal  Assessment:    2 Days Post-Op  Procedure(s) (LRB): TOTAL HIP ARTHROPLASTY (Right)  ADDITIONAL DIAGNOSIS:  Active Problems:   * No active hospital problems. *  no new problems   Plan: Physical Therapy as ordered Weight Bearing as Tolerated (WBAT)  DVT Prophylaxis:   Xarelto  DISCHARGE PLAN: Home  DISCHARGE NEEDS: has equipment at home     Dressing changed, wound clean, dry-feeling much better, will D/C today    Valeria Batman 10/06/2012 1:04 PM

## 2012-10-06 NOTE — Discharge Summary (Signed)
[name], MD   Jacqualine Code, PA-C [address] [place], [place]  [address]                             [phone]  PATIENT ID: April [name]        MRN:  [id number]          DOB/AGE: 09/04/1955 / 57 y.o.    DISCHARGE SUMMARY  ADMISSION DATE:    10/04/2012 DISCHARGE DATE:   10/06/2012   ADMISSION DIAGNOSIS: Avascular Necrosis Right Femoral Head    DISCHARGE DIAGNOSIS:  Avascular Necrosis Right Femoral Head    ADDITIONAL DIAGNOSIS: Active Problems:   * No active hospital problems. *  Past Medical History  Diagnosis Date  . Hypothyroid   . Smoker   . Osteopenia     forteo through Dr. Corliss Skains (started 10/12)  . Temporal arteritis     s/p prednisone taper  . AVN (avascular necrosis of bone)     hip and wrist  . Anxiety and depression     related to caring for mother during terminal illness  . Asthma   . FH: CAD (coronary artery disease)   . Endometriosis   . Arthritis   . Anxiety   . Depression   . Bronchitis     hx of  . PMR (polymyalgia rheumatica)     PROCEDURE: Procedure(s): TOTAL HIP ARTHROPLASTY Right on 10/04/2012  CONSULTS: none     HISTORY: April [name] is a very pleasant 57 year old white female who is seen today for evaluation of her right hip. She had a hip replacement done on the left about 2 or so years ago for avascular necrosis. She has had chronic AVN of the right hip prior to that surgery but did not have any progressive symptoms in the right hip until most recently. She is now having pain in the groin and also in the lateral aspect and posterior aspect of the proximal femur. She denies any radicular buttock-type pain. Her pain, unfortunately, has worsened to the point where she has to use a cane. No recent history of injury or trauma. Most everything she uses over-the-counter is not very productive. She does use tramadol for pain. She is also using some anti-inflammatories in addition to the Ultram. She did have an MRI scan performed which  revealed significant marrow edema surrounding the areas of the chronic AVN, extending down into the femoral neck in the area of the trochanteric region. The femoral head is also now slightly flattened. Findings consistent with a new/active osteonecrosis process, likely accounting for the patient's new hip pain. There is also an associated reactive joint effusion. She has gotten to the point now where she cannot take this and would like to consider possible total hip replacement   HOSPITAL COURSE:  LASHAN MACIAS is a 57 y.o. admitted on 10/04/2012 and found to have a diagnosis of Avascular Necrosis Right Femoral Head.  After appropriate laboratory studies were obtained  they were taken to the operating room on 10/04/2012 and underwent  Procedure(s): TOTAL HIP ARTHROPLASTY  Right.   They were given perioperative antibiotics:  Anti-infectives   Start     Dose/Rate Route Frequency Ordered Stop   10/04/12 1515  ceFAZolin (ANCEF) IVPB 2 g/50 mL premix     2 g 100 mL/hr over 30 Minutes Intravenous Every 6 hours 10/04/12 1504 10/04/12 2128   10/04/12 0600  ceFAZolin (ANCEF) IVPB 2 g/50 mL premix  2 g 100 mL/hr over 30 Minutes Intravenous On call to O.R. 10/03/12 1413 10/04/12 1039    .  Tolerated the procedure well.  Placed with a foley intraoperatively.     Toradol was given post op.  POD #1, allowed out of bed to a chair.  PT for ambulation and exercise program.  Foley D/C'd in morning.  IV saline locked.  O2 discontionued.  POD #2, continued PT and ambulation.  .  The remainder of the hospital course was dedicated to ambulation and strengthening.   The patient was discharged on 2 Days Post-Op in  Stable condition.  Blood products given:none  DIAGNOSTIC STUDIES: Recent vital signs: Patient Vitals for the past 24 hrs:  BP Temp Temp src Pulse Resp SpO2  10/06/12 1016 - - - - - 93 %  10/06/12 0544 116/65 mmHg 98.1 F (36.7 C) Oral 100 18 94 %  10/06/12 0400 - - - - 18 -  10/06/12 0000 -  - - - 18 -  10/05/12 2127 126/64 mmHg 100.4 F (38 C) Oral 108 18 95 %  10/05/12 2000 - - - - 18 95 %  10/05/12 1600 - - - - 20 -  10/05/12 1400 128/59 mmHg 98.2 F (36.8 C) - 98 16 92 %       Recent laboratory studies:  Recent Labs  10/05/12 0650 10/06/12 0342  WBC 7.6 7.3  HGB 9.9* 9.4*  HCT 28.8* 27.5*  PLT 141* 128*    Recent Labs  10/05/12 0650 10/06/12 0342  NA 135 138  K 3.5 3.5  CL 101 103  CO2 28 26  BUN 11 9  CREATININE 0.99 0.86  GLUCOSE 94 95  CALCIUM 8.3* 8.3*   Lab Results  Component Value Date   INR 0.99 09/29/2012   INR 0.99 06/12/2010     Recent Radiographic Studies :  Chest 2 View  09/29/2012   CLINICAL DATA:  Preoperative evaluation for right total hip arthroplasty, history coronary artery disease, smoking, asthma  EXAM: CHEST  2 VIEW  COMPARISON:  06/12/2010  FINDINGS: Normal heart size, mediastinal contours, and pulmonary vascularity.  Lungs emphysematous but clear.  No pleural effusion or pneumothorax.  No acute osseous findings.  IMPRESSION: Emphysematous changes consistent with COPD.  No acute abnormalities.   Electronically Signed   By: Ulyses Southward M.D.   On: 09/29/2012 13:27   Dg Pelvis Portable  10/04/2012   CLINICAL DATA:  Postop for right total hip arthroplasty.  EXAM: PORTABLE PELVIS  COMPARISON:  MRI 09/15/2012  FINDINGS: Interval right hip arthroplasty. No acute hardware complication or periprosthetic fracture identified. Surgical clips project lateral to the right-sided arthroplasty.  IMPRESSION: Expected appearance after interval right hip arthroplasty.   Electronically Signed   By: Jeronimo Greaves   On: 10/04/2012 14:33   Dg Hip Portable 1 View Right  10/04/2012   CLINICAL DATA:  Postop for right total hip arthroplasty.  EXAM: PORTABLE RIGHT HIP - 1 VIEW  COMPARISON:  Pelvic films same date and preoperative MRI of 09/15/2012.  FINDINGS: Single lateral view demonstrates interval right hip arthroplasty. No periprosthetic fracture or other  acute complication identified.  IMPRESSION: Expected appearance after right hip arthroplasty.   Electronically Signed   By: Jeronimo Greaves   On: 10/04/2012 14:34    DISCHARGE INSTRUCTIONS: Discharge Orders   Future Orders Complete By Expires   Call MD / Call 911  As directed    Comments:     If you experience chest  pain or shortness of breath, CALL 911 and be transported to the hospital emergency room.  If you develope a fever above 101 F, pus (white drainage) or increased drainage or redness at the wound, or calf pain, call your surgeon's office.   Change dressing  As directed    Comments:     You may change your dressing on SUNDAY, then change the dressing daily with sterile 4 x 4 inch gauze dressing and paper tape.  You may clean the incision with alcohol prior to redressing   Constipation Prevention  As directed    Comments:     Drink plenty of fluids.  Prune juice and/or coffee may be helpful.  You may use a stool softener, such as Colace (over the counter) 100 mg twice a day.  Use MiraLax (over the counter) for constipation as needed but this may take several days to work.  Mag Citrate --OR-- Milk of Magnesia may also be used but follow directions on the label.   Diet general  As directed    Driving restrictions  As directed    Comments:     No driving for 6 weeks   Follow the hip precautions as taught in Physical Therapy  As directed    Increase activity slowly as tolerated  As directed    Lifting restrictions  As directed    Comments:     No lifting for 6 weeks   Patient may shower  As directed    Comments:     You may shower over the brown dressing.  Once the dressing is removed you may shower without a dressing once there is no drainage.  Do not wash over the wound.  If drainage remains, cover wound with plastic wrap and then shower.   TED hose  As directed    Comments:     Use stockings (TED hose) for 1-2 weeks on operative leg(s).  You may remove them at night for sleeping. May  stop the NON-operative leg stocking when you go home.   Weight bearing as tolerated  As directed       DISCHARGE MEDICATIONS:     Medication List    STOP taking these medications       glucosamine-chondroitin 500-400 MG tablet     GNP BIOTIN PO     naproxen sodium 220 MG tablet  Commonly known as:  ANAPROX     traMADol 50 MG tablet  Commonly known as:  ULTRAM      TAKE these medications       acetaminophen 325 MG tablet  Commonly known as:  TYLENOL  Take 2 tablets (650 mg total) by mouth every 6 (six) hours as needed.     albuterol 108 (90 BASE) MCG/ACT inhaler  Commonly known as:  PROVENTIL HFA;VENTOLIN HFA  Inhale 2 puffs into the lungs every 6 (six) hours as needed for wheezing or shortness of breath.     ALPRAZolam 0.5 MG tablet  Commonly known as:  XANAX  Take 0.5 mg by mouth at bedtime as needed for anxiety.     buPROPion 300 MG 24 hr tablet  Commonly known as:  WELLBUTRIN XL  Take 1 tablet (300 mg total) by mouth daily.     CALCIUM 1200 1200-1000 MG-UNIT Chew  Chew 1 tablet by mouth daily.     Fluticasone-Salmeterol 250-50 MCG/DOSE Aepb  Commonly known as:  ADVAIR DISKUS  Inhale 2 puffs into the lungs daily.     Levothyroxine Sodium 150  MCG Caps  1 capsule. Take one tablet every day except for 1.5 tabs on Sunday     methocarbamol 500 MG tablet  Commonly known as:  ROBAXIN  Take 1 tablet (500 mg total) by mouth every 6 (six) hours as needed.     multivitamin with minerals Tabs tablet  Take 1 tablet by mouth daily.     oxyCODONE 5 MG immediate release tablet  Commonly known as:  Oxy IR/ROXICODONE  Take 1-2 tablets (5-10 mg total) by mouth every 4 (four) hours as needed.     rivaroxaban 10 MG Tabs tablet  Commonly known as:  XARELTO  Take 1 tablet (10 mg total) by mouth daily.     vitamin C 500 MG tablet  Commonly known as:  ASCORBIC ACID  Take 500 mg by mouth daily.        FOLLOW UP VISIT:       Follow-up Information   Follow up with  [facility], PA-C On 10/19/2012.   Specialty:  Orthopedic Surgery   Contact information:   [address] [place] [address] [phone]       DISPOSITION:   Home  CONDITION:  Stable   Quintez Maselli 10/06/2012, 1:17 PM

## 2012-10-06 NOTE — Progress Notes (Signed)
Physical Therapy Treatment Patient Details Name: April [name] MRN: [id number] DOB: 1955-09-21 Today's Date: 10/06/2012 Time: 0840-0909 PT Time Calculation (min): 29 min  PT Assessment / Plan / Recommendation  History of Present Illness RTHA--posterior   PT Comments   Pt progressing with therapy. Is supervision level for mobility and transfers. Pt hopes to D/C today; is concerned about car transfers. Will cont to f/u with pt while in acute setting to maximize functional independence and adhere to hip precautions.   Follow Up Recommendations  Home health PT;Supervision/Assistance - 24 hour     Does the patient have the potential to tolerate intense rehabilitation     Barriers to Discharge        Equipment Recommendations  None recommended by PT    Recommendations for Other Services    Frequency 7X/week   Progress towards PT Goals Progress towards PT goals: Progressing toward goals  Plan Current plan remains appropriate    Precautions / Restrictions Precautions Precautions: Posterior Hip Precaution Comments: pt able to recall 3/3 hip precautions; min cues during session to adhere to hip precautions  Required Braces or Orthoses: Knee Immobilizer - Right Knee Immobilizer - Right:  (in bed) Restrictions Weight Bearing Restrictions: Yes RLE Weight Bearing: Weight bearing as tolerated   Pertinent Vitals/Pain 4/10; premedicated     Mobility  Bed Mobility Bed Mobility: Sit to Supine Sit to Supine: 4: Min assist;HOB flat Details for Bed Mobility Assistance: (A) to advance Rt LE onto bed; cues for sequencing and to adhere to hip precautions  Transfers Transfers: Sit to Stand;Stand to Sit Sit to Stand: 5: Supervision;With armrests;From chair/3-in-1 Stand to Sit: 5: Supervision;To bed Details for Transfer Assistance: supervision for safety and min cues for technique and to adhere to hip precautions  Ambulation/Gait Ambulation/Gait Assistance: 5: Supervision Ambulation Distance  (Feet): 90 Feet Assistive device: Rolling walker Ambulation/Gait Assistance Details: cues for gt sequencing to equalized step length and maintain upright posture; pt reported she was relying less on UEs this session vs yesterday Gait Pattern: Step-through pattern;Trunk flexed Gait velocity: decreased  Stairs: No Wheelchair Mobility Wheelchair Mobility: No    Exercises Total Joint Exercises Ankle Circles/Pumps: AROM;Both;10 reps;Seated Quad Sets: AROM;Right;10 reps Gluteal Sets: 10 reps Hip ABduction/ADduction: AAROM;Right;10 reps (with UEs) Long Arc Quad: AROM;Right;10 reps Knee Flexion: AROM;Right;10 reps;Standing   PT Diagnosis:    PT Problem List:   PT Treatment Interventions:     PT Goals (current goals can now be found in the care plan section) Acute Rehab PT Goals Patient Stated Goal: home today PT Goal Formulation: With patient Time For Goal Achievement: 10/12/12 Potential to Achieve Goals: Good  Visit Information  Last PT Received On: 10/06/12 Assistance Needed: +1 History of Present Illness: RTHA--posterior    Subjective Data  Subjective: pt sitting in chair; agreeable to therapy. "i havent seen the doctor but im hoping to go home today  Patient Stated Goal: home today   Cognition  Cognition Arousal/Alertness: Awake/alert Behavior During Therapy: WFL for tasks assessed/performed Overall Cognitive Status: Within Functional Limits for tasks assessed    Balance  Balance Balance Assessed: No  End of Session PT - End of Session Equipment Utilized During Treatment: Gait belt Activity Tolerance: Patient tolerated treatment well Patient left: in bed;with call bell/phone within reach;with family/visitor present Nurse Communication: Mobility status   GP     Donell Sievert, [place] [phone] 10/06/2012, 10:02 AM

## 2012-10-07 ENCOUNTER — Encounter (HOSPITAL_COMMUNITY): Payer: Self-pay | Admitting: Orthopaedic Surgery

## 2012-10-07 NOTE — Progress Notes (Signed)
10/07/12 Set up with [facility] for HHPT by MD office. Patient already had rolling walker and 3N1. Jacquelynn Cree RN, BSN, CCM

## 2013-06-07 ENCOUNTER — Other Ambulatory Visit: Payer: Self-pay

## 2013-06-07 DIAGNOSIS — Z1231 Encounter for screening mammogram for malignant neoplasm of breast: Secondary | ICD-10-CM

## 2013-06-22 ENCOUNTER — Ambulatory Visit (INDEPENDENT_AMBULATORY_CARE_PROVIDER_SITE_OTHER): Payer: BC Managed Care – PPO | Admitting: Family Medicine

## 2013-06-22 ENCOUNTER — Encounter: Payer: Self-pay | Admitting: Family Medicine

## 2013-06-22 VITALS — BP 144/82 | HR 76 | Temp 97.9°F | Wt 176.8 lb

## 2013-06-22 DIAGNOSIS — R059 Cough, unspecified: Secondary | ICD-10-CM

## 2013-06-22 DIAGNOSIS — R05 Cough: Secondary | ICD-10-CM

## 2013-06-22 MED ORDER — ALPRAZOLAM 0.5 MG PO TABS: 0.5000 mg | ORAL_TABLET | Freq: Every evening | ORAL | Status: DC | PRN

## 2013-06-22 MED ORDER — FLUTICASONE-SALMETEROL 250-50 MCG/DOSE IN AEPB: 1.0000 | INHALATION_SPRAY | Freq: Two times a day (BID) | RESPIRATORY_TRACT | Status: DC

## 2013-06-22 MED ORDER — BUPROPION HCL ER (XL) 300 MG PO TB24: 300.0000 mg | ORAL_TABLET | Freq: Every day | ORAL | Status: DC

## 2013-06-22 MED ORDER — LEVOTHYROXINE SODIUM 125 MCG PO CAPS: 1.0000 | ORAL_CAPSULE | Freq: Every day | ORAL | Status: DC

## 2013-06-22 MED ORDER — DOXYCYCLINE HYCLATE 100 MG PO TABS: 100.0000 mg | ORAL_TABLET | Freq: Two times a day (BID) | ORAL | Status: DC

## 2013-06-22 MED ORDER — ALBUTEROL SULFATE HFA 108 (90 BASE) MCG/ACT IN AERS: 2.0000 | INHALATION_SPRAY | Freq: Four times a day (QID) | RESPIRATORY_TRACT | Status: DC | PRN

## 2013-06-22 NOTE — Progress Notes (Signed)
She has moved back to [place].  Needs to re-est care.  Still has f/u with Dr. Estanislado Pandy for bone density, prednisone dose, etc. She had her hip surgery last fall.   Some sneezing initially. Then had more cough and wheeze.  Dry cough at that point.  Still using her inhalers.  Noted change in weather recently, heat wave.  Now with more sputum, yellow/green.  She had coughed to the point of getting a little blood streaked sputum recently.  Taking 10mg  of prednisone a day at baseline.  Sick contacts noted, recently.  No ST, no ear pain.  Some rhinorrhea prev, not now.  Voice is altered.  Discussed smoking cessation, tapering off ecig.   PMH and SH reviewed  ROS: See HPI, otherwise noncontributory.  Meds, vitals, and allergies reviewed.   GEN: nad, alert and oriented HEENT: mucous membranes moist, tm w/o erythema, nasal exam w/o erythema, scant clear discharge noted,  OP with cobblestoning NECK: supple w/o LA CV: rrr.   PULM: ctab, no inc wob, no focal dec in BS, cough noted.   EXT: no edema SKIN: no acute rash

## 2013-06-22 NOTE — Patient Instructions (Addendum)
Get a physical scheduled when possible, labs ahead of time.  Start the doxycycline today.  Take an extra prednisone a day for 5 days in the meantime.   If the inhalers aren't helping after your get over this episode, then let me know.  Keep working to wean off the E cig.  Take care.  Glad to see you.

## 2013-06-23 ENCOUNTER — Telehealth: Payer: Self-pay | Admitting: Family Medicine

## 2013-06-23 DIAGNOSIS — R05 Cough: Secondary | ICD-10-CM | POA: Insufficient documentation

## 2013-06-23 DIAGNOSIS — R059 Cough, unspecified: Secondary | ICD-10-CM | POA: Insufficient documentation

## 2013-06-23 NOTE — Assessment & Plan Note (Signed)
Start the doxycycline today. Take an extra prednisone a day for 5 days in the meantime.  If the inhalers aren't helping after your get over this episode, then she'll notify us.   She'll keep working to wean off the E cig.  Old records reviewed, see scanned forms.  >25 minutes spent in face to face time with patient, >50% spent in counselling or coordination of care.

## 2013-06-23 NOTE — Telephone Encounter (Signed)
Relevant patient education assigned to patient using Emmi. ° °

## 2013-06-26 ENCOUNTER — Ambulatory Visit: Payer: BC Managed Care – PPO

## 2013-06-28 ENCOUNTER — Ambulatory Visit
Admission: RE | Admit: 2013-06-28 | Discharge: 2013-06-28 | Disposition: A | Discharge status: Home / Self Care | Payer: BC Managed Care – PPO | Source: Ambulatory Visit

## 2013-06-28 ENCOUNTER — Encounter: Payer: Self-pay | Admitting: Family Medicine

## 2013-06-28 DIAGNOSIS — Z1231 Encounter for screening mammogram for malignant neoplasm of breast: Secondary | ICD-10-CM

## 2013-06-29 ENCOUNTER — Encounter: Payer: Self-pay | Admitting: *Deleted

## 2013-07-31 ENCOUNTER — Other Ambulatory Visit: Payer: Self-pay | Admitting: Family Medicine

## 2013-07-31 DIAGNOSIS — M8580 Other specified disorders of bone density and structure, unspecified site: Secondary | ICD-10-CM

## 2013-07-31 DIAGNOSIS — E039 Hypothyroidism, unspecified: Secondary | ICD-10-CM

## 2013-08-04 ENCOUNTER — Other Ambulatory Visit: Payer: BC Managed Care – PPO

## 2013-08-07 ENCOUNTER — Encounter: Payer: Self-pay | Admitting: *Deleted

## 2013-08-07 ENCOUNTER — Other Ambulatory Visit (INDEPENDENT_AMBULATORY_CARE_PROVIDER_SITE_OTHER): Payer: BC Managed Care – PPO

## 2013-08-07 DIAGNOSIS — M899 Disorder of bone, unspecified: Secondary | ICD-10-CM

## 2013-08-07 DIAGNOSIS — M8580 Other specified disorders of bone density and structure, unspecified site: Secondary | ICD-10-CM

## 2013-08-07 DIAGNOSIS — E039 Hypothyroidism, unspecified: Secondary | ICD-10-CM

## 2013-08-07 DIAGNOSIS — M949 Disorder of cartilage, unspecified: Secondary | ICD-10-CM

## 2013-08-07 LAB — TSH: TSH: 1.19 u[IU]/mL (ref 0.35–4.50)

## 2013-08-07 LAB — BASIC METABOLIC PANEL
BUN: 16 mg/dL (ref 6–23)
CO2: 27 mEq/L (ref 19–32)
Calcium: 9.1 mg/dL (ref 8.4–10.5)
Chloride: 99 mEq/L (ref 96–112)
Creatinine, Ser: 1.1 mg/dL (ref 0.4–1.2)
GFR: 55.52 mL/min — ABNORMAL LOW (ref >60.00)
Glucose, Bld: 88 mg/dL (ref 70–99)
Potassium: 3.9 mEq/L (ref 3.5–5.1)
Sodium: 134 mEq/L — ABNORMAL LOW (ref 135–145)

## 2013-08-07 LAB — VITAMIN D 25 HYDROXY (VIT D DEFICIENCY, FRACTURES): VITD: 52.24 ng/mL (ref 30.00–100.00)

## 2013-08-08 ENCOUNTER — Encounter: Payer: Self-pay | Admitting: Family Medicine

## 2013-08-08 ENCOUNTER — Ambulatory Visit (INDEPENDENT_AMBULATORY_CARE_PROVIDER_SITE_OTHER): Payer: BC Managed Care – PPO | Admitting: Family Medicine

## 2013-08-08 VITALS — BP 138/84 | HR 107 | Temp 98.0°F | Ht 66.25 in | Wt 177.8 lb

## 2013-08-08 DIAGNOSIS — J45909 Unspecified asthma, uncomplicated: Secondary | ICD-10-CM

## 2013-08-08 DIAGNOSIS — E039 Hypothyroidism, unspecified: Secondary | ICD-10-CM

## 2013-08-08 DIAGNOSIS — M316 Other giant cell arteritis: Secondary | ICD-10-CM

## 2013-08-08 DIAGNOSIS — Z0000 Encounter for general adult medical examination without abnormal findings: Secondary | ICD-10-CM

## 2013-08-08 DIAGNOSIS — F341 Dysthymic disorder: Secondary | ICD-10-CM

## 2013-08-08 DIAGNOSIS — F17200 Nicotine dependence, unspecified, uncomplicated: Secondary | ICD-10-CM

## 2013-08-08 MED ORDER — CYCLOBENZAPRINE HCL 10 MG PO TABS: 5.0000 mg | ORAL_TABLET | Freq: Three times a day (TID) | ORAL | Status: DC | PRN

## 2013-08-08 NOTE — Patient Instructions (Addendum)
Try to get off the cigarettes and keep stretching/using heat/ice on your neck.  Try the flexeril- it can make you drowsy.  Take care.  Glad to see you.

## 2013-08-08 NOTE — Progress Notes (Signed)
Pre visit review using our clinic review tool, if applicable. No additional management support is needed unless otherwise documented below in the visit note.  CPE- See plan.  Routine anticipatory guidance given to patient.  See health maintenance. Shingles shot deferred for now given the prednisone use.   Flu shot to be done this fall.  Tetanus 2013 PNA 2011 No pap due to hysterectomy.   Mammogram done 06/2013 DXA 2014 Colon cancer screening. +FH. Colonoscopy done 2011 and normal per patient. Due for 5 year f/u in 2016.  Living will d/w pt.  Husband designated if patient were incapacitated.   Diet and exercise d/w pt.  Encouraged both. She is exercising some but walking outside limited by heat. Diet is fair, "I'm trying to lose weight."   She is working to stop smoking, tapering off cigs.    Asthma. Some SOB, worse with the heat but no CP.  Using meds at baseline.  On prednisone currently for possible PMR flare per rheum.  Some wheeze occ noted, worse in the heat. SABA helps, used occ.    H/o anx/depp.  Doing well.  Rare BZD use.  "I learned not to go off my med."  No SI/HI.    Hypothyroid.  No neck mass, no dysphagia.  Compliant with meds.  Labs d/w pt.    Episodic R sided neck pain and TMJ pain, using heat and ice with some relief.stretching.  Some R sided HA likely resulting from that (doesn't appear to be TA related)  PMH and SH reviewed  Meds, vitals, and allergies reviewed.   Recheck BP 138/84  ROS: See HPI.  Otherwise negative.    GEN: nad, alert and oriented HEENT: mucous membranes moist, tm wnl, R TMJ ttp but TA not ttp NECK: supple w/o LA, no tmg CV: rrr. PULM: ctab except for faint exp wheeze, no inc wob ABD: soft, +bs EXT: no edema SKIN: no acute rash

## 2013-08-09 NOTE — Assessment & Plan Note (Signed)
d/w pt about smoking, wouldn't change meds at this point.  With likely upcoming change of seasons and her expected nicotine taper, this should improve.  She agrees, didn't want to change meds at this point. Okay for outpatient fu.

## 2013-08-09 NOTE — Assessment & Plan Note (Signed)
Discussed cessation 

## 2013-08-09 NOTE — Assessment & Plan Note (Signed)
This doesn't appear to be active. Current sx appear to be due to neck muscle tightness and TMJ pain, see instructions.  Can use flexeril for now. She has already limited gum, etc.

## 2013-08-09 NOTE — Assessment & Plan Note (Signed)
Routine anticipatory guidance given to patient.  See health maintenance. Shingles shot deferred for now given the prednisone use.   Flu shot to be done this fall.  Tetanus 2013 PNA 2011 No pap due to hysterectomy.   Mammogram done 06/2013 DXA 2014 Colon cancer screening. +FH. Colonoscopy done 2011 and normal per patient. Due for 5 year f/u in 2016.  Living will d/w pt.  Husband designated if patient were incapacitated.   Diet and exercise d/w pt.  Encouraged both. She is exercising some but walking outside limited by heat. Diet is fair, "I'm trying to lose weight."   She is working to stop smoking, tapering off cigs.

## 2013-08-09 NOTE — Assessment & Plan Note (Signed)
tsh wnl, continue current meds.

## 2013-08-09 NOTE — Assessment & Plan Note (Signed)
Controlled, continue current meds.  She agrees.   

## 2013-10-24 ENCOUNTER — Ambulatory Visit (INDEPENDENT_AMBULATORY_CARE_PROVIDER_SITE_OTHER): Payer: BC Managed Care – PPO

## 2013-10-24 ENCOUNTER — Ambulatory Visit: Payer: BC Managed Care – PPO

## 2013-10-24 DIAGNOSIS — Z23 Encounter for immunization: Secondary | ICD-10-CM

## 2013-11-02 ENCOUNTER — Other Ambulatory Visit: Payer: Self-pay | Admitting: Family Medicine

## 2014-02-28 ENCOUNTER — Other Ambulatory Visit: Payer: Self-pay | Admitting: Family Medicine

## 2014-05-28 ENCOUNTER — Other Ambulatory Visit: Payer: Self-pay | Admitting: Family Medicine

## 2014-05-28 NOTE — Telephone Encounter (Signed)
Received refill request electronically from pharmacy. Last refill 03/01/14 18 inhaler/2 refills, last office visit 08/08/13. Is it okay to refill medication?

## 2014-05-29 NOTE — Telephone Encounter (Signed)
Noted, thanks!

## 2014-05-29 NOTE — Telephone Encounter (Signed)
Patient notified as instructed by telephone and verbalized understanding. Patient stated that she has a physical scheduled in a few months. Patient stated that she has been using the inhaler more recently with the pollen. Patient stated that she will see how she does over the next two weeks and will call back for an appointment if she feels that she needs to come in before her physical.

## 2014-05-29 NOTE — Telephone Encounter (Signed)
Sent.  Thanks.  If frequently needed or wheezing, then please schedule f/u.  Thanks.

## 2014-06-01 ENCOUNTER — Ambulatory Visit (INDEPENDENT_AMBULATORY_CARE_PROVIDER_SITE_OTHER): Payer: BC Managed Care – PPO | Admitting: Family Medicine

## 2014-06-01 ENCOUNTER — Ambulatory Visit (INDEPENDENT_AMBULATORY_CARE_PROVIDER_SITE_OTHER)
Admission: RE | Admit: 2014-06-01 | Discharge: 2014-06-01 | Disposition: A | Discharge status: Home / Self Care | Payer: BC Managed Care – PPO | Source: Ambulatory Visit | Attending: Family Medicine | Admitting: Family Medicine

## 2014-06-01 ENCOUNTER — Encounter: Payer: Self-pay | Admitting: Family Medicine

## 2014-06-01 VITALS — BP 130/86 | HR 82 | Temp 98.4°F | Wt 177.0 lb

## 2014-06-01 DIAGNOSIS — J449 Chronic obstructive pulmonary disease, unspecified: Secondary | ICD-10-CM | POA: Diagnosis not present

## 2014-06-01 DIAGNOSIS — J441 Chronic obstructive pulmonary disease with (acute) exacerbation: Secondary | ICD-10-CM | POA: Diagnosis not present

## 2014-06-01 MED ORDER — ALPRAZOLAM 0.5 MG PO TABS: 0.5000 mg | ORAL_TABLET | Freq: Every evening | ORAL | Status: DC | PRN

## 2014-06-01 MED ORDER — TIOTROPIUM BROMIDE MONOHYDRATE 18 MCG IN CAPS: 18.0000 ug | ORAL_CAPSULE | Freq: Every day | RESPIRATORY_TRACT | Status: DC

## 2014-06-01 NOTE — Progress Notes (Signed)
Pre visit review using our clinic review tool, if applicable. No additional management support is needed unless otherwise documented below in the visit note.  Still on advair with PRN SABA but needing SABA more often.  Needed daily SABA recently.  SABA helps some.   Pre SABA, more cough, SOB, wheeze.  All better after SABA use, duration is variable.   More exertional sx recently, over the last season- she attributed to season change/allergies.  No fevers.  No chills.  Some sputum, occ, usually clear, not discolored.   Down to almost no cigarette use.   She was globally better when on prednisone, worse off prednisone.   Sick contacts noted at home.   She has been gradually getting worse for the last few months.    PMH and SH reviewed  ROS: See HPI, otherwise noncontributory.  Meds, vitals, and allergies reviewed.   GEN: nad, alert and oriented HEENT: mucous membranes moist NECK: supple w/o LA CV: rrr.  PULM: ctab, no inc wob, no wheeze noted.   ABD: soft, +bs EXT: no edema SKIN: no acute rash

## 2014-06-01 NOTE — Patient Instructions (Signed)
Add on spiriva once a day.  Update me in about 1-2 weeks.   You should need the ventolin less.  Take care.  Glad to see you.

## 2014-06-04 ENCOUNTER — Encounter: Payer: Self-pay | Admitting: Family Medicine

## 2014-06-04 DIAGNOSIS — J449 Chronic obstructive pulmonary disease, unspecified: Secondary | ICD-10-CM | POA: Insufficient documentation

## 2014-06-04 DIAGNOSIS — J209 Acute bronchitis, unspecified: Secondary | ICD-10-CM | POA: Insufficient documentation

## 2014-06-04 NOTE — Assessment & Plan Note (Addendum)
Likely, d/w pt.   Add on spiriva once a day. Update me in about 1-2 weeks.  She should need the ventolin less in the meantime.  She agrees. Okay for outpatient f/u.  See notes on cxr, images reviewed and I agree.

## 2014-06-06 ENCOUNTER — Telehealth: Payer: Self-pay | Admitting: Family Medicine

## 2014-06-06 NOTE — Telephone Encounter (Signed)
Returned patient's call and results were given.

## 2014-06-06 NOTE — Telephone Encounter (Signed)
Patient returned Lugene's call. °

## 2014-06-24 ENCOUNTER — Other Ambulatory Visit: Payer: Self-pay | Admitting: Family Medicine

## 2014-06-27 ENCOUNTER — Other Ambulatory Visit: Payer: Self-pay

## 2014-06-28 ENCOUNTER — Other Ambulatory Visit: Payer: Self-pay | Admitting: Rheumatology

## 2014-06-28 DIAGNOSIS — N959 Unspecified menopausal and perimenopausal disorder: Secondary | ICD-10-CM

## 2014-07-04 ENCOUNTER — Other Ambulatory Visit: Payer: Self-pay | Admitting: Rheumatology

## 2014-07-04 ENCOUNTER — Other Ambulatory Visit: Payer: Self-pay

## 2014-07-04 DIAGNOSIS — Z780 Asymptomatic menopausal state: Secondary | ICD-10-CM

## 2014-07-04 DIAGNOSIS — Z1231 Encounter for screening mammogram for malignant neoplasm of breast: Secondary | ICD-10-CM

## 2014-07-06 ENCOUNTER — Ambulatory Visit
Admission: RE | Admit: 2014-07-06 | Discharge: 2014-07-06 | Disposition: A | Discharge status: Home / Self Care | Payer: BC Managed Care – PPO | Source: Ambulatory Visit | Attending: Rheumatology | Admitting: Rheumatology

## 2014-07-06 ENCOUNTER — Ambulatory Visit
Admission: RE | Admit: 2014-07-06 | Discharge: 2014-07-06 | Disposition: A | Discharge status: Home / Self Care | Payer: BC Managed Care – PPO | Source: Ambulatory Visit

## 2014-07-06 DIAGNOSIS — Z1231 Encounter for screening mammogram for malignant neoplasm of breast: Secondary | ICD-10-CM

## 2014-07-06 DIAGNOSIS — Z780 Asymptomatic menopausal state: Secondary | ICD-10-CM

## 2014-07-10 ENCOUNTER — Encounter: Payer: Self-pay | Admitting: *Deleted

## 2014-08-01 ENCOUNTER — Ambulatory Visit (INDEPENDENT_AMBULATORY_CARE_PROVIDER_SITE_OTHER): Payer: BC Managed Care – PPO | Admitting: Family Medicine

## 2014-08-01 ENCOUNTER — Encounter: Payer: Self-pay | Admitting: Family Medicine

## 2014-08-01 VITALS — BP 124/78 | HR 108 | Temp 99.1°F | Wt 178.0 lb

## 2014-08-01 DIAGNOSIS — J449 Chronic obstructive pulmonary disease, unspecified: Secondary | ICD-10-CM

## 2014-08-01 MED ORDER — DOXYCYCLINE HYCLATE 100 MG PO TABS: 100.0000 mg | ORAL_TABLET | Freq: Two times a day (BID) | ORAL | Status: DC

## 2014-08-01 MED ORDER — PREDNISONE 20 MG PO TABS: ORAL_TABLET | ORAL | Status: DC

## 2014-08-01 NOTE — Progress Notes (Signed)
Pre visit review using our clinic review tool, if applicable. No additional management support is needed unless otherwise documented below in the visit note.  She is going to see Dr. Manley Mason at [facility] re: [facility] on her L cheek.    Smoking less in the meantime.  Not even using E cig much recently.   Some days w/o any tobacco now.    Cough.  Started about 4 days ago.  Started with ST, then pain with swallowing, then chest felt tight.  HA for the last few days.  No facial pain.  Cough.  Some sputum, discolored.  Some wheeze.  She used SABA today, 1st time recently used.  It helped some, no wheeze after use.  Still on advair and spiriva, had been doing well until she got sick and the weather got hot.  Meds, vitals, and allergies reviewed.   ROS: See HPI.  Otherwise, noncontributory.  GEN: nad, alert and oriented HEENT: mucous membranes moist, tm w/o erythema, nasal exam w/o erythema, clear discharge noted,  OP with cobblestoning NECK: supple w/o LA CV: rrr.   PULM: ctab, no inc wob, cough noted.  No wheeze.  No focal dec in BS EXT: no edema SKIN: no acute rash

## 2014-08-01 NOTE — Patient Instructions (Signed)
Start doxy, use the albuterol, and use the prednisone with food if you aren't improving.   Take care.  Glad to see you.  Update me as needed.

## 2014-08-02 NOTE — Assessment & Plan Note (Signed)
Likely exacerbation, given her hx would start doxy, use albuterol, and use the prednisone with food if not improving.  Hold pred for now.  She agrees.  Still okay for outpatient f/u.

## 2014-08-05 ENCOUNTER — Other Ambulatory Visit: Payer: Self-pay | Admitting: Family Medicine

## 2014-08-05 DIAGNOSIS — M8580 Other specified disorders of bone density and structure, unspecified site: Secondary | ICD-10-CM

## 2014-08-05 DIAGNOSIS — E039 Hypothyroidism, unspecified: Secondary | ICD-10-CM

## 2014-08-09 ENCOUNTER — Other Ambulatory Visit (INDEPENDENT_AMBULATORY_CARE_PROVIDER_SITE_OTHER): Payer: BC Managed Care – PPO

## 2014-08-09 DIAGNOSIS — M8580 Other specified disorders of bone density and structure, unspecified site: Secondary | ICD-10-CM | POA: Diagnosis not present

## 2014-08-09 DIAGNOSIS — E039 Hypothyroidism, unspecified: Secondary | ICD-10-CM | POA: Diagnosis not present

## 2014-08-09 LAB — BASIC METABOLIC PANEL
BUN: 19 mg/dL (ref 6–23)
CO2: 29 mEq/L (ref 19–32)
Calcium: 9.4 mg/dL (ref 8.4–10.5)
Chloride: 104 mEq/L (ref 96–112)
Creatinine, Ser: 0.99 mg/dL (ref 0.40–1.20)
GFR: 60.53 mL/min (ref >60.00)
Glucose, Bld: 84 mg/dL (ref 70–99)
POTASSIUM: 3.9 meq/L (ref 3.5–5.1)
SODIUM: 139 meq/L (ref 135–145)

## 2014-08-09 LAB — TSH: TSH: 5.54 u[IU]/mL — ABNORMAL HIGH (ref 0.35–4.50)

## 2014-08-09 LAB — VITAMIN D 25 HYDROXY (VIT D DEFICIENCY, FRACTURES): VITD: 29.36 ng/mL — AB (ref 30.00–100.00)

## 2014-08-10 ENCOUNTER — Ambulatory Visit (INDEPENDENT_AMBULATORY_CARE_PROVIDER_SITE_OTHER): Payer: BC Managed Care – PPO | Admitting: Family Medicine

## 2014-08-10 ENCOUNTER — Encounter: Payer: Self-pay | Admitting: Family Medicine

## 2014-08-10 VITALS — BP 144/84 | HR 98 | Temp 98.2°F | Ht 66.0 in | Wt 181.5 lb

## 2014-08-10 DIAGNOSIS — E039 Hypothyroidism, unspecified: Secondary | ICD-10-CM | POA: Diagnosis not present

## 2014-08-10 DIAGNOSIS — R059 Cough, unspecified: Secondary | ICD-10-CM

## 2014-08-10 DIAGNOSIS — R05 Cough: Secondary | ICD-10-CM

## 2014-08-10 DIAGNOSIS — Z0000 Encounter for general adult medical examination without abnormal findings: Secondary | ICD-10-CM

## 2014-08-10 DIAGNOSIS — Z7189 Other specified counseling: Secondary | ICD-10-CM

## 2014-08-10 DIAGNOSIS — E559 Vitamin D deficiency, unspecified: Secondary | ICD-10-CM

## 2014-08-10 MED ORDER — BUPROPION HCL ER (XL) 300 MG PO TB24
300.0000 mg | ORAL_TABLET | Freq: Every day | ORAL | Status: DC
Start: 2014-08-10 — End: 2014-09-25

## 2014-08-10 MED ORDER — LEVOTHYROXINE SODIUM 125 MCG PO TABS: 125.0000 ug | ORAL_TABLET | Freq: Every day | ORAL | Status: DC

## 2014-08-10 MED ORDER — CHOLECALCIFEROL 25 MCG (1000 UT) PO TABS: 3000.0000 [IU] | ORAL_TABLET | Freq: Every day | ORAL | Status: DC

## 2014-08-10 NOTE — Progress Notes (Signed)
CPE- See plan.  Routine anticipatory guidance given to patient.  See health maintenance. Shingles shot deferred for now given the prednisone use. Flu shot to be done this fall.  Tetanus 2013 PNA 2011 No pap due to hysterectomy. Mammogram done 2016 DXA 2016 Colon cancer screening. +FH. Colonoscopy done 2011 and normal per patient. Due for 5 year f/u in 2016.  Living will d/w pt. Husband designated if patient were incapacitated.  Diet and exercise d/w pt. Encouraged both. She is exercising some but walking outside limited by heat. Diet is fair, "I'm trying to lose weight."  She is working to stop smoking, tapering off cigs.   Cough is improved.  She had to start prednisone but then improved.  No sputum now.  She clearly feels better.    Hypothyroidism.  No neck mass. TSH up slightly.  Compliant with meds.  No ADE on med.  Labs d/w pt.   Vit D.  Slightly low.  D/w pt about lab and replacement.    PMH and SH reviewed  Meds, vitals, and allergies reviewed.   ROS: See HPI.  Otherwise negative.    GEN: nad, alert and oriented HEENT: mucous membranes moist NECK: supple w/o LA, thyroid not ttp CV: rrr. PULM: ctab, no inc wob ABD: soft, +bs EXT: no edema SKIN: no acute rash

## 2014-08-10 NOTE — Patient Instructions (Addendum)
Call about the colonoscopy follow up.   I would get a flu shot each fall.   Take care.  Glad to see you.  Recheck labs in about 2 months.  Up your vitamin D and thyroid medicine in the meantime.   Take care.  Glad to see you.

## 2014-08-14 DIAGNOSIS — Z8739 Personal history of other diseases of the musculoskeletal system and connective tissue: Secondary | ICD-10-CM | POA: Insufficient documentation

## 2014-08-14 DIAGNOSIS — Z7189 Other specified counseling: Secondary | ICD-10-CM | POA: Insufficient documentation

## 2014-08-14 DIAGNOSIS — E559 Vitamin D deficiency, unspecified: Secondary | ICD-10-CM | POA: Insufficient documentation

## 2014-08-14 NOTE — Assessment & Plan Note (Signed)
Inc replacement to 3000 IU a day and recheck in a few months.  D/w pt.  She agrees.

## 2014-08-14 NOTE — Assessment & Plan Note (Signed)
Much improved, ctab.

## 2014-08-14 NOTE — Assessment & Plan Note (Addendum)
Routine anticipatory guidance given to patient.  See health maintenance. Shingles shot deferred for now given the prednisone use. Flu shot to be done this fall.  Tetanus 2013 PNA 2011 No pap due to hysterectomy. Mammogram done 2016 DXA 2016 Colon cancer screening. +FH. Colonoscopy done 2011 and normal per patient. Due for 5 year f/u in 2016. She'll call about that.  D/w pt.  Living will d/w pt. Husband designated if patient were incapacitated.  Diet and exercise d/w pt. Encouraged both. She is exercising some but walking outside limited by heat. Diet is fair, "I'm trying to lose weight."  She is working to stop smoking, tapering off cigs.

## 2014-08-14 NOTE — Assessment & Plan Note (Addendum)
Will inc replacement to 153mcg a day, except for 1.5 tabs on Sundays.  Recheck in about 2 months.

## 2014-09-08 ENCOUNTER — Other Ambulatory Visit: Payer: Self-pay | Admitting: Family Medicine

## 2014-09-21 ENCOUNTER — Other Ambulatory Visit: Payer: Self-pay

## 2014-09-21 ENCOUNTER — Other Ambulatory Visit: Payer: Self-pay | Admitting: Family Medicine

## 2014-09-21 MED ORDER — LEVOTHYROXINE SODIUM 125 MCG PO TABS: 125.0000 ug | ORAL_TABLET | Freq: Every day | ORAL | Status: DC

## 2014-09-21 NOTE — Telephone Encounter (Signed)
Pt lost rx for levothyroxine 125 mg and pt changing pharmacy to [place]; advised pt sending electronically now.

## 2014-09-25 ENCOUNTER — Other Ambulatory Visit: Payer: Self-pay

## 2014-09-25 MED ORDER — BUPROPION HCL ER (XL) 300 MG PO TB24
300.0000 mg | ORAL_TABLET | Freq: Every day | ORAL | Status: DC
Start: 2014-09-25 — End: 2015-08-15

## 2014-09-25 NOTE — Telephone Encounter (Signed)
Pt left v/m requesting refill bupropion to [facility]; pt said [facility] could not find rx. Spoke with Richardson Landry at [facility] and he could not find bupropion rx. Left detailed v/m per DPR for pt rx sent to [facility] as requested.

## 2014-10-12 ENCOUNTER — Other Ambulatory Visit (INDEPENDENT_AMBULATORY_CARE_PROVIDER_SITE_OTHER): Payer: BC Managed Care – PPO

## 2014-10-12 DIAGNOSIS — E559 Vitamin D deficiency, unspecified: Secondary | ICD-10-CM

## 2014-10-12 DIAGNOSIS — Z23 Encounter for immunization: Secondary | ICD-10-CM

## 2014-10-12 DIAGNOSIS — E039 Hypothyroidism, unspecified: Secondary | ICD-10-CM

## 2014-10-12 LAB — TSH: TSH: 0.37 u[IU]/mL (ref 0.35–4.50)

## 2014-10-12 LAB — VITAMIN D 25 HYDROXY (VIT D DEFICIENCY, FRACTURES): VITD: 47.7 ng/mL (ref 30.00–100.00)

## 2015-05-17 ENCOUNTER — Other Ambulatory Visit: Payer: Self-pay | Admitting: Family Medicine

## 2015-06-13 ENCOUNTER — Other Ambulatory Visit: Payer: Self-pay | Admitting: Family Medicine

## 2015-07-18 ENCOUNTER — Other Ambulatory Visit: Payer: Self-pay | Admitting: Family Medicine

## 2015-07-18 DIAGNOSIS — Z1231 Encounter for screening mammogram for malignant neoplasm of breast: Secondary | ICD-10-CM

## 2015-07-31 ENCOUNTER — Ambulatory Visit
Admission: RE | Admit: 2015-07-31 | Discharge: 2015-07-31 | Disposition: A | Discharge status: Home / Self Care | Payer: BC Managed Care – PPO | Source: Ambulatory Visit | Attending: Family Medicine | Admitting: Family Medicine

## 2015-07-31 DIAGNOSIS — Z1231 Encounter for screening mammogram for malignant neoplasm of breast: Secondary | ICD-10-CM

## 2015-08-02 ENCOUNTER — Encounter: Payer: Self-pay | Admitting: *Deleted

## 2015-08-09 ENCOUNTER — Other Ambulatory Visit: Payer: Self-pay | Admitting: Family Medicine

## 2015-08-09 ENCOUNTER — Encounter: Payer: Self-pay | Admitting: Family Medicine

## 2015-08-09 DIAGNOSIS — Z8249 Family history of ischemic heart disease and other diseases of the circulatory system: Secondary | ICD-10-CM

## 2015-08-09 DIAGNOSIS — E039 Hypothyroidism, unspecified: Secondary | ICD-10-CM

## 2015-08-09 DIAGNOSIS — E559 Vitamin D deficiency, unspecified: Secondary | ICD-10-CM

## 2015-08-12 ENCOUNTER — Other Ambulatory Visit (INDEPENDENT_AMBULATORY_CARE_PROVIDER_SITE_OTHER): Payer: BC Managed Care – PPO

## 2015-08-12 DIAGNOSIS — E559 Vitamin D deficiency, unspecified: Secondary | ICD-10-CM

## 2015-08-12 DIAGNOSIS — Z8249 Family history of ischemic heart disease and other diseases of the circulatory system: Secondary | ICD-10-CM

## 2015-08-12 DIAGNOSIS — E039 Hypothyroidism, unspecified: Secondary | ICD-10-CM

## 2015-08-12 LAB — COMPREHENSIVE METABOLIC PANEL
ALBUMIN: 4.3 g/dL (ref 3.5–5.2)
ALK PHOS: 79 U/L (ref 39–117)
ALT: 13 U/L (ref 0–35)
AST: 13 U/L (ref 0–37)
BILIRUBIN TOTAL: 0.4 mg/dL (ref 0.2–1.2)
BUN: 13 mg/dL (ref 6–23)
CO2: 28 mEq/L (ref 19–32)
Calcium: 9.8 mg/dL (ref 8.4–10.5)
Chloride: 103 mEq/L (ref 96–112)
Creatinine, Ser: 1.11 mg/dL (ref 0.40–1.20)
GFR: 52.87 mL/min — AB (ref >60.00)
GLUCOSE: 97 mg/dL (ref 70–99)
POTASSIUM: 4.1 meq/L (ref 3.5–5.1)
Sodium: 139 mEq/L (ref 135–145)
TOTAL PROTEIN: 7.6 g/dL (ref 6.0–8.3)

## 2015-08-12 LAB — LIPID PANEL
CHOLESTEROL: 202 mg/dL — AB (ref 0–200)
HDL: 49.5 mg/dL (ref >39.00)
LDL Cholesterol: 139 mg/dL — ABNORMAL HIGH (ref 0–99)
NONHDL: 152.84
Total CHOL/HDL Ratio: 4
Triglycerides: 69 mg/dL (ref 0.0–149.0)
VLDL: 13.8 mg/dL (ref 0.0–40.0)

## 2015-08-12 LAB — VITAMIN D 25 HYDROXY (VIT D DEFICIENCY, FRACTURES): VITD: 38.77 ng/mL (ref 30.00–100.00)

## 2015-08-12 LAB — TSH: TSH: 0.49 u[IU]/mL (ref 0.35–4.50)

## 2015-08-13 ENCOUNTER — Encounter: Payer: BC Managed Care – PPO | Admitting: Family Medicine

## 2015-08-15 ENCOUNTER — Ambulatory Visit (INDEPENDENT_AMBULATORY_CARE_PROVIDER_SITE_OTHER): Payer: BC Managed Care – PPO | Admitting: Family Medicine

## 2015-08-15 ENCOUNTER — Encounter: Payer: Self-pay | Admitting: Family Medicine

## 2015-08-15 ENCOUNTER — Telehealth: Payer: Self-pay | Admitting: Gastroenterology

## 2015-08-15 VITALS — BP 118/78 | HR 93 | Temp 98.4°F | Ht 66.0 in | Wt 184.0 lb

## 2015-08-15 DIAGNOSIS — Z0000 Encounter for general adult medical examination without abnormal findings: Secondary | ICD-10-CM | POA: Diagnosis not present

## 2015-08-15 DIAGNOSIS — J449 Chronic obstructive pulmonary disease, unspecified: Secondary | ICD-10-CM

## 2015-08-15 DIAGNOSIS — Z119 Encounter for screening for infectious and parasitic diseases, unspecified: Secondary | ICD-10-CM

## 2015-08-15 DIAGNOSIS — M1990 Unspecified osteoarthritis, unspecified site: Secondary | ICD-10-CM

## 2015-08-15 DIAGNOSIS — F341 Dysthymic disorder: Secondary | ICD-10-CM

## 2015-08-15 DIAGNOSIS — Z1211 Encounter for screening for malignant neoplasm of colon: Secondary | ICD-10-CM

## 2015-08-15 DIAGNOSIS — E039 Hypothyroidism, unspecified: Secondary | ICD-10-CM

## 2015-08-15 MED ORDER — NAPROXEN SODIUM 220 MG PO TABS: 220.0000 mg | ORAL_TABLET | Freq: Two times a day (BID) | ORAL | Status: DC | PRN

## 2015-08-15 MED ORDER — TIOTROPIUM BROMIDE MONOHYDRATE 18 MCG IN CAPS: ORAL_CAPSULE | RESPIRATORY_TRACT | 99 refills | Status: DC

## 2015-08-15 MED ORDER — TRAMADOL HCL 50 MG PO TABS: 50.0000 mg | ORAL_TABLET | Freq: Four times a day (QID) | ORAL | 1 refills | Status: DC | PRN

## 2015-08-15 MED ORDER — CYCLOBENZAPRINE HCL 10 MG PO TABS: 5.0000 mg | ORAL_TABLET | Freq: Three times a day (TID) | ORAL | 1 refills | Status: DC | PRN

## 2015-08-15 MED ORDER — FLUTICASONE-SALMETEROL 250-50 MCG/DOSE IN AEPB: INHALATION_SPRAY | RESPIRATORY_TRACT | 99 refills | Status: DC

## 2015-08-15 MED ORDER — BUPROPION HCL ER (XL) 300 MG PO TB24: 300.0000 mg | ORAL_TABLET | Freq: Every day | ORAL | 3 refills | Status: DC

## 2015-08-15 MED ORDER — ALBUTEROL SULFATE HFA 108 (90 BASE) MCG/ACT IN AERS: INHALATION_SPRAY | RESPIRATORY_TRACT | 99 refills | Status: DC

## 2015-08-15 MED ORDER — LEVOTHYROXINE SODIUM 125 MCG PO TABS: 125.0000 ug | ORAL_TABLET | Freq: Every day | ORAL | 3 refills | Status: DC

## 2015-08-15 NOTE — Assessment & Plan Note (Signed)
Continue prn tramadol and continue exercise.  D/w pt.

## 2015-08-15 NOTE — Assessment & Plan Note (Signed)
She's working to stop smoking, continue inhalers.  Ctab.

## 2015-08-15 NOTE — Assessment & Plan Note (Signed)
Flu shot to be done this fall.  Tetanus 2013 PNA 2011 Shingles d/w pt.  See AVS.   No pap due to hysterectomy. Mammogram done 2017 DXA 2016 Colon cancer screening. +FH. Colonoscopy done 2011 and normal per patient. Due, d/w pt. Referred.   Living will d/w pt. Husband designated if patient were incapacitated.  Diet and exercise d/w pt. Encouraged both. She is exercising some with a stationary bike and use some weights.  Diet is fair, "I'm trying to lose weight."  She is working to stop smoking, tapering off cigs.  She prev quit but then started back.  D/w pt.  5-6 cigs per day. Pt opts in for HCV screening.  D/w pt re: routine screening.   Pt opts in for HIV screening.  D/w pt re: routine screening.   She'll continue hydrocortisone for the rash and update me as needed.  She agrees.

## 2015-08-15 NOTE — Telephone Encounter (Signed)
Received colon and path reports. Dr. Loletha Carrow is Doc of the day. Records placed on Dr. Corena Pilgrim desk for review.

## 2015-08-15 NOTE — Patient Instructions (Addendum)
Check with your insurance to see if they will cover the shingles shot. Rosaria Ferries will call about your referral. See her on the way out.  Keep using hydrocortisone for now and update me as needed.  Take care.  Glad to see you.

## 2015-08-15 NOTE — Progress Notes (Signed)
CPE- See plan.  Routine anticipatory guidance given to patient.  See health maintenance. Flu shot to be done this fall.  Tetanus 2013 PNA 2011 Shingles d/w pt.  See AVS.   No pap due to hysterectomy. Mammogram done 2017 DXA 2016 Colon cancer screening. +FH. Colonoscopy done 2011 and normal per patient. Due, d/w pt. Referred.   Living will d/w pt. Husband designated if patient were incapacitated.  Diet and exercise d/w pt. Encouraged both. She is exercising some with a stationary bike and use some weights.  Diet is fair, "I'm trying to lose weight."  She is working to stop smoking, tapering off cigs.  She prev quit but then started back.  D/w pt.  5-6 cigs per day. Pt opts in for HCV screening.  D/w pt re: routine screening.   Pt opts in for HIV screening.  D/w pt re: routine screening.    Mood.  Doing well in general.  She has some extended family stressors noted and she is working through that.  She is safe at home.  Compliant with med.   COPD.  Compliant.  D/w pt about smoking.  She is working to quit.  Using SABA prn, not often.  Still on controlled meds.    Hypothyroidism.   No neck mass, no lumps.  Compliant with med.  No fatigue.   Labs d/w pt.   Still with occ tramadol use for her hip pain, if she "overdoes it" with exercise or work.    She has a reddish itchy rash on the arms and legs.  No med or soap changes.  No triggers.  Some better in the meantime.  Some better with hydrocortisone.    PMH and SH reviewed  Meds, vitals, and allergies reviewed.   ROS: Per HPI.  Unless specifically indicated otherwise in HPI, the patient denies:  General: fever. Eyes: acute vision changes ENT: sore throat Cardiovascular: chest pain Respiratory: SOB GI: vomiting GU: dysuria Musculoskeletal: acute back pain Derm: acute rash- see above and below.  Neuro: acute motor dysfunction Psych: worsening mood Endocrine: polydipsia Heme: bleeding Allergy: hayfever  GEN: nad, alert and  oriented HEENT: mucous membranes moist NECK: supple w/o LA, no tmg CV: rrr. PULM: ctab, no inc wob ABD: soft, +bs EXT: no edema SKIN: no acute rash other than faint rash on the extensor forearm and B shins

## 2015-08-15 NOTE — Progress Notes (Signed)
Pre visit review using our clinic review tool, if applicable. No additional management support is needed unless otherwise documented below in the visit note. 

## 2015-08-15 NOTE — Assessment & Plan Note (Signed)
Controlled, doing well.  Continue as is.  She agrees.  She'll update me as needed.

## 2015-08-15 NOTE — Assessment & Plan Note (Signed)
Continue as is, tsh controlled.  She had some thinning hair, she'll update me if continued.  Thin hair not noted on exam.

## 2015-08-16 ENCOUNTER — Other Ambulatory Visit: Payer: Self-pay | Admitting: Family Medicine

## 2015-08-20 ENCOUNTER — Encounter: Payer: Self-pay | Admitting: Family Medicine

## 2015-08-20 DIAGNOSIS — Z1211 Encounter for screening for malignant neoplasm of colon: Secondary | ICD-10-CM | POA: Insufficient documentation

## 2015-08-20 NOTE — Telephone Encounter (Signed)
Dr. Loletha Carrow reviewed records and has accepted patient. Per Dr. Loletha Carrow, Patient is not due for next colon until 06/2018. I informed patient of this and she agrees. Patient states that she will call our office if she has any problems. Recall Colon has been entered and records sent to scan.

## 2015-09-11 ENCOUNTER — Other Ambulatory Visit: Payer: Self-pay | Admitting: Family Medicine

## 2015-11-12 ENCOUNTER — Ambulatory Visit (INDEPENDENT_AMBULATORY_CARE_PROVIDER_SITE_OTHER): Payer: BC Managed Care – PPO

## 2015-11-12 DIAGNOSIS — Z23 Encounter for immunization: Secondary | ICD-10-CM

## 2015-11-25 ENCOUNTER — Ambulatory Visit (INDEPENDENT_AMBULATORY_CARE_PROVIDER_SITE_OTHER): Payer: BC Managed Care – PPO | Admitting: Rheumatology

## 2015-11-25 ENCOUNTER — Encounter: Payer: Self-pay | Admitting: Rheumatology

## 2015-11-25 ENCOUNTER — Ambulatory Visit: Payer: Self-pay | Admitting: Rheumatology

## 2015-11-25 VITALS — BP 141/95 | HR 103 | Resp 13 | Ht 66.5 in | Wt 190.0 lb

## 2015-11-25 DIAGNOSIS — M353 Polymyalgia rheumatica: Secondary | ICD-10-CM

## 2015-11-25 DIAGNOSIS — G8929 Other chronic pain: Secondary | ICD-10-CM | POA: Diagnosis not present

## 2015-11-25 DIAGNOSIS — M25562 Pain in left knee: Secondary | ICD-10-CM

## 2015-11-25 DIAGNOSIS — M8580 Other specified disorders of bone density and structure, unspecified site: Secondary | ICD-10-CM

## 2015-11-25 DIAGNOSIS — M47816 Spondylosis without myelopathy or radiculopathy, lumbar region: Secondary | ICD-10-CM | POA: Diagnosis not present

## 2015-11-25 DIAGNOSIS — M25561 Pain in right knee: Secondary | ICD-10-CM | POA: Diagnosis not present

## 2015-11-25 MED ORDER — IBANDRONATE SODIUM 150 MG PO TABS: 150.0000 mg | ORAL_TABLET | ORAL | 2 refills | Status: DC

## 2015-11-25 MED ORDER — DICLOFENAC SODIUM 50 MG PO TBEC: 50.0000 mg | DELAYED_RELEASE_TABLET | Freq: Two times a day (BID) | ORAL | 1 refills | Status: DC

## 2015-11-25 MED ORDER — METHOCARBAMOL 500 MG PO TABS: 500.0000 mg | ORAL_TABLET | Freq: Two times a day (BID) | ORAL | 2 refills | Status: DC | PRN
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
## 2016-01-24 ENCOUNTER — Ambulatory Visit (INDEPENDENT_AMBULATORY_CARE_PROVIDER_SITE_OTHER): Payer: BC Managed Care – PPO | Admitting: Family Medicine

## 2016-01-24 ENCOUNTER — Encounter: Payer: Self-pay | Admitting: Family Medicine

## 2016-01-24 DIAGNOSIS — B349 Viral infection, unspecified: Secondary | ICD-10-CM | POA: Insufficient documentation

## 2016-01-24 LAB — POC INFLUENZA A&B (BINAX/QUICKVUE)
INFLUENZA B, POC: NEGATIVE
Influenza A, POC: NEGATIVE

## 2016-01-24 MED ORDER — PREDNISONE 20 MG PO TABS: ORAL_TABLET | ORAL | 0 refills | Status: DC

## 2016-01-24 MED ORDER — DOXYCYCLINE HYCLATE 100 MG PO TABS: 100.0000 mg | ORAL_TABLET | Freq: Two times a day (BID) | ORAL | 0 refills | Status: DC

## 2016-01-24 NOTE — Progress Notes (Signed)
duration of symptoms: 2 days ago.   Rhinorrhea:mild initially, better now congestion:yes ear pain:yes sore throat:initially mild, better now.   Cough:yes, with sputum.   Myalgias: yes Fevers: yes, until sweats last night; no fevers now.  Taking tylenol and mucinex and still on baseline inhalers.   Vomited prev, then dry heaves this AM.  Some prev wheeze, less in the last day or so per patient report.   Per HPI unless specifically indicated in ROS section   Meds, vitals, and allergies reviewed.   GEN: nad, alert and oriented HEENT: mucous membranes moist, TM w/o erythema, nasal epithelium injected, OP with cobblestoning, sinuses not ttp NECK: supple w/o LA CV: rrr. PULM: ctab, no inc wob, no wheeze, no focal dec in BS ABD: soft, +bs EXT: no edema  Flu test negative.

## 2016-01-24 NOTE — Patient Instructions (Signed)
Likely a non-flu virus.   Rest and fluids.  Continue regular meds.  Add on prednisone with food.   If not better and coughing up discolored sputum in a few days, then add on doxy.  Take care.  Glad to see you.

## 2016-01-24 NOTE — Assessment & Plan Note (Signed)
Likely a non-flu virus.   Rest and fluids.  Continue regular meds.  Add on prednisone with food.   If not better and coughing up discolored sputum in a few days, then add on doxy.  Okay to take tylenol as needed, avoid nsaids with pred use.  dw pt.

## 2016-03-17 ENCOUNTER — Telehealth: Payer: Self-pay

## 2016-03-17 NOTE — Telephone Encounter (Signed)
Pt left v/m; pt is presently on spiriva handihaler; if can change to spiriva respimat pt can get for free for one year with a coupon pt has. Pt wants to know if Dr Damita Dunnings can change med. Pt seen 08/15/15. Pt request cb.

## 2016-03-18 MED ORDER — TIOTROPIUM BROMIDE MONOHYDRATE 2.5 MCG/ACT IN AERS: 2.0000 | INHALATION_SPRAY | Freq: Every day | RESPIRATORY_TRACT | 12 refills | Status: DC

## 2016-03-18 NOTE — Telephone Encounter (Signed)
Detailed message left on voicemail (DPR) as instructed by telephone. Taken care of per Dr. Damita Dunnings.

## 2016-03-18 NOTE — Telephone Encounter (Signed)
Changed.  Thanks.  Sent.

## 2016-05-18 ENCOUNTER — Telehealth: Payer: Self-pay | Admitting: Family Medicine

## 2016-05-18 NOTE — Telephone Encounter (Signed)
Pt has appt with Dr Damita Dunnings on 05/19/16 at 11:45.

## 2016-05-18 NOTE — Telephone Encounter (Signed)
Patient Name: April [name]  DOB: 11-02-1955    Initial Comment Caller states her Vertigo isn't getting better, nausea.   Nurse Assessment  Nurse: Raphael Gibney, RN, Vera Date/Time (Eastern Time): 05/18/2016 12:00:12 PM  Confirm and document reason for call. If symptomatic, describe symptoms. ---Caller states she has vertigo. She was at the beauty shop a couple of weeks ago and she was walking to the left when she sat up from getting her hair washed. Still has vertigo. Her neck is hurting in the back. Has been nauseated. Vomited the day the vertigo started. No vertigo now.  Does the patient have any new or worsening symptoms? ---Yes  Will a triage be completed? ---Yes  Related visit to physician within the last 2 weeks? ---No  Does the PT have any chronic conditions? (i.e. diabetes, asthma, etc.) ---Yes  List chronic conditions. ---hypothyroidism; arthritis; fibromyalgia  Is this a behavioral health or substance abuse call? ---No     Guidelines    Guideline Title Affirmed Question Affirmed Notes  Dizziness - Vertigo [1] MODERATE dizziness (e.g., vertigo; feels very unsteady, interferes with normal activities) AND [2] has NOT been evaluated by physician for this    Final Disposition User   See Physician within 24 Hours Raphael Gibney, RN, Vera    Comments  appt schedule for 05/19/2016 11:45 am with Dr Damita Dunnings   Referrals  REFERRED TO PCP OFFICE  REFERRED TO PCP OFFICE   Disagree/Comply: Comply

## 2016-05-19 ENCOUNTER — Ambulatory Visit (INDEPENDENT_AMBULATORY_CARE_PROVIDER_SITE_OTHER): Payer: BC Managed Care – PPO | Admitting: Family Medicine

## 2016-05-19 ENCOUNTER — Encounter: Payer: Self-pay | Admitting: Family Medicine

## 2016-05-19 VITALS — BP 128/88 | HR 104 | Temp 98.6°F | Wt 173.2 lb

## 2016-05-19 DIAGNOSIS — H8110 Benign paroxysmal vertigo, unspecified ear: Secondary | ICD-10-CM

## 2016-05-19 NOTE — Progress Notes (Signed)
Vertigo.  "I just got dizzy."  She was at the beauty shop.  She was getting out of the chair and ended up walking to the left.  The room was spinning but not when she was laying back, only after sitting back up.  Laying down helped.  No presyncope. She has vomited when the dizziness was at its worst.  She has been intermittently symptomatic for the last 2 weeks.  Some days w/o troubles, then some days with sx returning.  Unclear if the room always spins in the same direction.  She tried dramamine w/o relief.  No other focal neuro changes.    She has been on weight waters for about 2 months.  She is eating healthier foods.  Some post nasal gtt and congestion.    Meds, vitals, and allergies reviewed.   ROS: Per HPI unless specifically indicated in ROS section   GEN: nad, alert and oriented HEENT: mucous membranes moist NECK: supple w/o LA CV: rrr.  PULM: ctab, no inc wob ABD: soft, +bs EXT: no edema SKIN: no acute rash CN 2-12 wnl B, S/S/DTR wnl x4 Vertigo sx with turning head down and to left.

## 2016-05-19 NOTE — Patient Instructions (Addendum)
Likely BPV.   Use the bedside exercise in the meantime.  It should gradually get better.  Take care.  Glad to see you.

## 2016-05-20 DIAGNOSIS — H8110 Benign paroxysmal vertigo, unspecified ear: Secondary | ICD-10-CM | POA: Insufficient documentation

## 2016-05-20 NOTE — Assessment & Plan Note (Signed)
Likely diagnosis. She has reproducible symptoms. Discussed with patient about home bedside exercises. Update me as needed. She agrees. Pathophysiology discussed with patient.

## 2016-05-22 DIAGNOSIS — M5136 Other intervertebral disc degeneration, lumbar region: Secondary | ICD-10-CM | POA: Insufficient documentation

## 2016-05-22 DIAGNOSIS — Z96643 Presence of artificial hip joint, bilateral: Secondary | ICD-10-CM | POA: Insufficient documentation

## 2016-05-22 DIAGNOSIS — M353 Polymyalgia rheumatica: Secondary | ICD-10-CM | POA: Insufficient documentation
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
## 2016-05-25 ENCOUNTER — Ambulatory Visit: Payer: BC Managed Care – PPO | Admitting: Rheumatology

## 2016-06-03 ENCOUNTER — Encounter: Payer: Self-pay | Admitting: Rheumatology

## 2016-06-03 ENCOUNTER — Ambulatory Visit (INDEPENDENT_AMBULATORY_CARE_PROVIDER_SITE_OTHER): Payer: BC Managed Care – PPO | Admitting: Rheumatology

## 2016-06-03 VITALS — BP 128/74 | HR 78 | Resp 14 | Wt 170.0 lb

## 2016-06-03 DIAGNOSIS — M87051 Idiopathic aseptic necrosis of right femur: Secondary | ICD-10-CM

## 2016-06-03 DIAGNOSIS — Z8639 Personal history of other endocrine, nutritional and metabolic disease: Secondary | ICD-10-CM | POA: Diagnosis not present

## 2016-06-03 DIAGNOSIS — M5136 Other intervertebral disc degeneration, lumbar region: Secondary | ICD-10-CM

## 2016-06-03 DIAGNOSIS — M8589 Other specified disorders of bone density and structure, multiple sites: Secondary | ICD-10-CM

## 2016-06-03 DIAGNOSIS — E559 Vitamin D deficiency, unspecified: Secondary | ICD-10-CM

## 2016-06-03 DIAGNOSIS — M316 Other giant cell arteritis: Secondary | ICD-10-CM | POA: Diagnosis not present

## 2016-06-03 DIAGNOSIS — Z96643 Presence of artificial hip joint, bilateral: Secondary | ICD-10-CM

## 2016-06-03 DIAGNOSIS — Z8709 Personal history of other diseases of the respiratory system: Secondary | ICD-10-CM | POA: Diagnosis not present

## 2016-06-03 DIAGNOSIS — F17200 Nicotine dependence, unspecified, uncomplicated: Secondary | ICD-10-CM

## 2016-06-03 DIAGNOSIS — D649 Anemia, unspecified: Secondary | ICD-10-CM

## 2016-06-03 DIAGNOSIS — Z79899 Other long term (current) drug therapy: Secondary | ICD-10-CM | POA: Diagnosis not present

## 2016-06-03 DIAGNOSIS — M353 Polymyalgia rheumatica: Secondary | ICD-10-CM

## 2016-06-03 LAB — CBC WITH DIFFERENTIAL/PLATELET
BASOS PCT: 1 %
Basophils Absolute: 69 cells/uL (ref 0–200)
EOS ABS: 138 {cells}/uL (ref 15–500)
Eosinophils Relative: 2 %
HCT: 43.6 % (ref 35.0–45.0)
Hemoglobin: 14.6 g/dL (ref 11.7–15.5)
LYMPHS PCT: 25 %
Lymphs Abs: 1725 cells/uL (ref 850–3900)
MCH: 30.9 pg (ref 27.0–33.0)
MCHC: 33.5 g/dL (ref 32.0–36.0)
MCV: 92.2 fL (ref 80.0–100.0)
MONO ABS: 483 {cells}/uL (ref 200–950)
MONOS PCT: 7 %
MPV: 11.4 fL (ref 7.5–12.5)
NEUTROS ABS: 4485 {cells}/uL (ref 1500–7800)
Neutrophils Relative %: 65 %
PLATELETS: 198 10*3/uL (ref 140–400)
RBC: 4.73 MIL/uL (ref 3.80–5.10)
RDW: 14 % (ref 11.0–15.0)
WBC: 6.9 10*3/uL (ref 3.8–10.8)

## 2016-06-03 LAB — COMPLETE METABOLIC PANEL WITH GFR
ALT: 15 U/L (ref 6–29)
AST: 16 U/L (ref 10–35)
Albumin: 4.3 g/dL (ref 3.6–5.1)
Alkaline Phosphatase: 83 U/L (ref 33–130)
BILIRUBIN TOTAL: 0.4 mg/dL (ref 0.2–1.2)
BUN: 13 mg/dL (ref 7–25)
CHLORIDE: 104 mmol/L (ref 98–110)
CO2: 24 mmol/L (ref 20–31)
CREATININE: 1.13 mg/dL — AB (ref 0.50–0.99)
Calcium: 9.7 mg/dL (ref 8.6–10.4)
GFR, Est African American: 60 mL/min (ref >=60)
GFR, Est Non African American: 52 mL/min — ABNORMAL LOW (ref >=60)
GLUCOSE: 97 mg/dL (ref 65–99)
Potassium: 4.5 mmol/L (ref 3.5–5.3)
Sodium: 140 mmol/L (ref 135–146)
TOTAL PROTEIN: 7.2 g/dL (ref 6.1–8.1)

## 2016-06-03 MED ORDER — IBANDRONATE SODIUM 150 MG PO TABS: 150.0000 mg | ORAL_TABLET | ORAL | 1 refills | Status: DC

## 2016-06-04 LAB — VITAMIN D 25 HYDROXY (VIT D DEFICIENCY, FRACTURES): Vit D, 25-Hydroxy: 48 ng/mL (ref 30–100)

## 2016-07-21 ENCOUNTER — Telehealth: Payer: Self-pay | Admitting: Family Medicine

## 2016-07-21 DIAGNOSIS — M8580 Other specified disorders of bone density and structure, unspecified site: Secondary | ICD-10-CM

## 2016-07-21 NOTE — Telephone Encounter (Signed)
Pt called - she is requesting a bone density test - She would like to go to the breast imaging center on [address]   Call back - [phone]

## 2016-07-22 NOTE — Telephone Encounter (Signed)
Patient notified as instructed by telephone and verbalized understanding. Patient stated that she got a call from the nurse at Dr. Arlean Hopping office advising that they can not order anything at [facility] because it is a conflict of interest, which she does not understanding. Patient stated that she has had all of her imaging done at that facility for years. Patient stated that she is having to have the bone density test done every two years because of medication that she has been on. Patient stated that she is just not going to have the test done if they will not order it where she always goes to.  I called and spoke to [place] (nurse) at Dr. Arlean Hopping office and was advised that they can not order any test for patients at [facility] because Dr. Arlean Hopping husband is a radiologist there.

## 2016-07-22 NOTE — Telephone Encounter (Signed)
She already has an order through [employer]  That should cover it.  The results should go back to [facility] and I'll defer to the ordering MD.

## 2016-07-23 NOTE — Telephone Encounter (Signed)
I put in the order.  Thanks. 

## 2016-07-27 ENCOUNTER — Other Ambulatory Visit: Payer: Self-pay | Admitting: Family Medicine

## 2016-07-27 DIAGNOSIS — Z1231 Encounter for screening mammogram for malignant neoplasm of breast: Secondary | ICD-10-CM

## 2016-08-03 ENCOUNTER — Other Ambulatory Visit: Payer: Self-pay | Admitting: Rheumatology

## 2016-08-04 NOTE — Telephone Encounter (Signed)
Patient also receives a prescription for Flexeril 1/2 tab to 1 tab 3 times daily as needed from PCP. Last received in August 2017.  Last Visit: 06/03/16 Next visit: 12/04/16  Okay to refill Robaxin?

## 2016-08-04 NOTE — Telephone Encounter (Signed)
She can not have Methocarbamol with Flexeril

## 2016-08-05 ENCOUNTER — Ambulatory Visit
Admission: RE | Admit: 2016-08-05 | Discharge: 2016-08-05 | Disposition: A | Discharge status: Home / Self Care | Payer: BC Managed Care – PPO | Source: Ambulatory Visit | Attending: Family Medicine | Admitting: Family Medicine

## 2016-08-05 ENCOUNTER — Encounter: Payer: Self-pay | Admitting: Family Medicine

## 2016-08-05 DIAGNOSIS — Z1231 Encounter for screening mammogram for malignant neoplasm of breast: Secondary | ICD-10-CM

## 2016-08-05 DIAGNOSIS — M8580 Other specified disorders of bone density and structure, unspecified site: Secondary | ICD-10-CM

## 2016-08-10 ENCOUNTER — Other Ambulatory Visit: Payer: Self-pay | Admitting: Family Medicine

## 2016-08-10 DIAGNOSIS — E039 Hypothyroidism, unspecified: Secondary | ICD-10-CM

## 2016-08-10 DIAGNOSIS — E785 Hyperlipidemia, unspecified: Secondary | ICD-10-CM

## 2016-08-10 DIAGNOSIS — Z114 Encounter for screening for human immunodeficiency virus [HIV]: Secondary | ICD-10-CM

## 2016-08-10 DIAGNOSIS — Z1159 Encounter for screening for other viral diseases: Secondary | ICD-10-CM

## 2016-08-12 ENCOUNTER — Other Ambulatory Visit (INDEPENDENT_AMBULATORY_CARE_PROVIDER_SITE_OTHER): Payer: BC Managed Care – PPO

## 2016-08-12 DIAGNOSIS — Z1159 Encounter for screening for other viral diseases: Secondary | ICD-10-CM

## 2016-08-12 DIAGNOSIS — E785 Hyperlipidemia, unspecified: Secondary | ICD-10-CM | POA: Diagnosis not present

## 2016-08-12 DIAGNOSIS — E039 Hypothyroidism, unspecified: Secondary | ICD-10-CM

## 2016-08-12 DIAGNOSIS — Z114 Encounter for screening for human immunodeficiency virus [HIV]: Secondary | ICD-10-CM

## 2016-08-12 LAB — TSH: TSH: 4.01 u[IU]/mL (ref 0.35–4.50)

## 2016-08-12 LAB — BASIC METABOLIC PANEL
BUN: 16 mg/dL (ref 6–23)
CO2: 31 meq/L (ref 19–32)
Calcium: 10.1 mg/dL (ref 8.4–10.5)
Chloride: 103 mEq/L (ref 96–112)
Creatinine, Ser: 1.11 mg/dL (ref 0.40–1.20)
GFR: 52.69 mL/min — AB (ref >60.00)
GLUCOSE: 104 mg/dL — AB (ref 70–99)
POTASSIUM: 4.3 meq/L (ref 3.5–5.1)
Sodium: 138 mEq/L (ref 135–145)

## 2016-08-12 LAB — LIPID PANEL
CHOL/HDL RATIO: 5
Cholesterol: 198 mg/dL (ref 0–200)
HDL: 43.7 mg/dL (ref >39.00)
LDL Cholesterol: 142 mg/dL — ABNORMAL HIGH (ref 0–99)
NONHDL: 153.96
Triglycerides: 62 mg/dL (ref 0.0–149.0)
VLDL: 12.4 mg/dL (ref 0.0–40.0)

## 2016-08-13 LAB — HIV ANTIBODY (ROUTINE TESTING W REFLEX): HIV: NONREACTIVE

## 2016-08-13 LAB — HEPATITIS C ANTIBODY: HCV Ab: NONREACTIVE

## 2016-08-17 ENCOUNTER — Encounter: Payer: Self-pay | Admitting: Family Medicine

## 2016-08-17 ENCOUNTER — Ambulatory Visit (INDEPENDENT_AMBULATORY_CARE_PROVIDER_SITE_OTHER): Payer: BC Managed Care – PPO | Admitting: Family Medicine

## 2016-08-17 VITALS — BP 130/82 | HR 87 | Temp 98.4°F | Ht 67.0 in | Wt 162.2 lb

## 2016-08-17 DIAGNOSIS — F329 Major depressive disorder, single episode, unspecified: Secondary | ICD-10-CM | POA: Diagnosis not present

## 2016-08-17 DIAGNOSIS — F419 Anxiety disorder, unspecified: Secondary | ICD-10-CM

## 2016-08-17 DIAGNOSIS — E039 Hypothyroidism, unspecified: Secondary | ICD-10-CM

## 2016-08-17 DIAGNOSIS — Z0000 Encounter for general adult medical examination without abnormal findings: Secondary | ICD-10-CM

## 2016-08-17 DIAGNOSIS — J449 Chronic obstructive pulmonary disease, unspecified: Secondary | ICD-10-CM

## 2016-08-17 DIAGNOSIS — M8580 Other specified disorders of bone density and structure, unspecified site: Secondary | ICD-10-CM | POA: Diagnosis not present

## 2016-08-17 DIAGNOSIS — F341 Dysthymic disorder: Secondary | ICD-10-CM

## 2016-08-17 MED ORDER — TIOTROPIUM BROMIDE MONOHYDRATE 2.5 MCG/ACT IN AERS: 2.0000 | INHALATION_SPRAY | Freq: Every day | RESPIRATORY_TRACT | 12 refills | Status: DC

## 2016-08-17 MED ORDER — LEVOTHYROXINE SODIUM 125 MCG PO TABS: 125.0000 ug | ORAL_TABLET | Freq: Every day | ORAL | 3 refills | Status: DC

## 2016-08-17 MED ORDER — BUPROPION HCL ER (XL) 300 MG PO TB24: 300.0000 mg | ORAL_TABLET | Freq: Every day | ORAL | 3 refills | Status: DC

## 2016-08-17 MED ORDER — FLUTICASONE-SALMETEROL 250-50 MCG/DOSE IN AEPB: INHALATION_SPRAY | RESPIRATORY_TRACT | 99 refills | Status: DC

## 2016-08-17 MED ORDER — IBANDRONATE SODIUM 150 MG PO TABS: 150.0000 mg | ORAL_TABLET | ORAL | 3 refills | Status: DC

## 2016-08-17 MED ORDER — ALBUTEROL SULFATE HFA 108 (90 BASE) MCG/ACT IN AERS: INHALATION_SPRAY | RESPIRATORY_TRACT | 99 refills | Status: DC

## 2016-08-17 NOTE — Progress Notes (Signed)
CPE- See plan.  Routine anticipatory guidance given to patient.  See health maintenance.  The possibility exists that previously documented standard health maintenance information may have been brought forward from a previous encounter into this note.  If needed, that same information has been updated to reflect the current situation based on today's encounter.    Flu shot to be done this fall.  Tetanus 2013 PNA 2011 Shingles d/w pt.  See AVS.   No pap due to hysterectomy. Mammogram done 2018 DXA 2018 Colon cancer screening. +FH. Colonoscopy done prev.   Living will d/w pt. Husband designated if patient were incapacitated.  Diet and exercise d/w pt. Encouraged both. Diet is better.   She started weight watchers in the meantime. "I'm trying to lose weight."  Smoking cessation d/w pt.  She restarted in the meantime.  4-5 cigs in the day.  She is working to quit.  She'll update me if I can help.  She is considering nicotine substitute, ie vaping vs gum.   Pt opts in for HCV and HIV screening d/w pt.    Mood d/w pt.  Mood is still good on wellbutrin and she didn't want to stop med.  She had panic sx off med prev.  No SI/HI.    H/o osteoporosis, prev treated with boniva and then 1.5 years of forteo, now back on boniva for about 1 year per rheumatology.  She hasn't seen rheumatology in the meantime.  No ADE on med.   Risk and benefit of med, including long bone/atypical fx d/w pt.  Likely that risk is outweighed by the benefit, d/w pt.    COPD.  Needs SABA more this summer, still on baseline meds.  More wheeze this summer. Discussed smoking cessation.   Hypothyroidism.  No neck mass. No consistent dysphagia.  Compliant with med.  Labs d/w pt.    PMH and SH reviewed  Meds, vitals, and allergies reviewed.   ROS: Per HPI.  Unless specifically indicated otherwise in HPI, the patient denies:  General: fever. Eyes: acute vision changes ENT: sore throat Cardiovascular: chest  pain Respiratory: SOB GI: vomiting GU: dysuria Musculoskeletal: acute back pain Derm: acute rash Neuro: acute motor dysfunction Psych: worsening mood Endocrine: polydipsia Heme: bleeding Allergy: hayfever  GEN: nad, alert and oriented HEENT: mucous membranes moist NECK: supple w/o LA CV: rrr. PULM: ctab, no inc wob ABD: soft, +bs EXT: no edema SKIN: no acute rash

## 2016-08-17 NOTE — Patient Instructions (Addendum)
I would get a flu shot each fall.   Check with your insurance to see if they will cover the shingrix shot. Take care.  Glad to see you.  Let me know if I can help you cut back on smoking.

## 2016-08-18 ENCOUNTER — Telehealth: Payer: Self-pay | Admitting: Gastroenterology

## 2016-08-18 ENCOUNTER — Telehealth: Payer: Self-pay | Admitting: Family Medicine

## 2016-08-18 ENCOUNTER — Telehealth: Payer: Self-pay | Admitting: Radiology

## 2016-08-18 DIAGNOSIS — M8580 Other specified disorders of bone density and structure, unspecified site: Secondary | ICD-10-CM

## 2016-08-18 NOTE — Assessment & Plan Note (Addendum)
H/o osteoporosis, prev treated with boniva and then 1.5 years of forteo, now back on boniva for about 1 year per rheumatology.  She hasn't seen rheumatology in the meantime.  No ADE on med.   Risk and benefit of med, including long bone/atypical fx d/w pt.  Likely that risk is outweighed by the benefit, d/w pt.  I will ask for rheumatology input.

## 2016-08-18 NOTE — Assessment & Plan Note (Signed)
No thyromegaly. Continue as is. Labs discussed with patient.

## 2016-08-18 NOTE — Telephone Encounter (Signed)
I thank all involved. 

## 2016-08-18 NOTE — Assessment & Plan Note (Signed)
Mood d/w pt.  Mood is still good on wellbutrin and she didn't want to stop med.  She had panic sx off med prev.  No SI/HI.   Reasonable to continue med as is.

## 2016-08-18 NOTE — Telephone Encounter (Addendum)
Note to Dr. Estanislado Pandy and a copy of the bone density test faxed.  Dr. Arlean Hopping response is on another note.  Please refer to it.

## 2016-08-18 NOTE — Assessment & Plan Note (Signed)
Needs smoking cessation. Continue as needed albuterol, continue baseline medications. No wheeze on exam today. Okay for outpatient follow-up.

## 2016-08-18 NOTE — Telephone Encounter (Signed)
April [name],    This patient's prior colonoscopy and pathology records came across my desk for review last week.  As there were only hyperplastic polyps, it seemed her next routine colonoscopy should be in 2020.  However, her PCP Dr. Damita Dunnings was good enough to inform me that April [name]' mother had colon cancer.  That puts her at increased risk , and current recommendations are that such patients should have a colonoscopy every 5 years.  Therefore, if her 2010 colonoscopy with Dr. Lajoyce Corners was her last colonoscopy, then she is in fact overdue for one now.  Please call her, give her this information, and set up an [facility] nurse visit for colonoscopy scheduling/prep instructions.

## 2016-08-18 NOTE — Telephone Encounter (Signed)
Spoke to patient, she is scheduled for pre-visit and colonoscopy in September.

## 2016-08-18 NOTE — Telephone Encounter (Signed)
She may take a drug holiday for 2 years.

## 2016-08-18 NOTE — Telephone Encounter (Signed)
Dr Damita Dunnings has sent you a fax wanting you to advise on patients bone density study done recently    The BMD measured at [facility] L1-L4 is 0.894 g/cm2 with a T-score of -2.4. This patient is considered osteopenic according to [facility]) criteria. Left femur was excluded due to surgical hardware. Right femur was excluded due to surgical hardware. There has been a statistically significant decrease in BMD of Lumbar spine since prior exam dated 07/06/2014.  He wants to know if patient should continue [facility]

## 2016-08-18 NOTE — Assessment & Plan Note (Signed)
Flu shot to be done this fall.  Tetanus 2013 PNA 2011 Shingles d/w pt.  See AVS.   No pap due to hysterectomy. Mammogram done 2018 DXA 2018 Colon cancer screening. +FH. Colonoscopy done prev.   Living will d/w pt. Husband designated if patient were incapacitated.  Diet and exercise d/w pt. Encouraged both. Diet is better.   She started weight watchers in the meantime. "I'm trying to lose weight."  Smoking cessation d/w pt.  She restarted in the meantime.  4-5 cigs in the day.  She is working to quit.  She'll update me if I can help.  She is considering nicotine substitute, ie vaping vs gum.   Pt opts in for HCV and HIV screening d/w pt.

## 2016-08-18 NOTE — Telephone Encounter (Signed)
-----   Message from [name], MD sent at 08/18/2016  6:47 AM EDT ----- Just for clarity on my part, I want to make sure that the patient does not need follow-up colonoscopy in the meantime. She does have a family history. Thanks.

## 2016-08-18 NOTE — Telephone Encounter (Signed)
Call Dr. Arlean Hopping rheumatology office. I presume that it is reasonable to continue Boniva for now given her most recent bone density results. I need input from Dr. Estanislado Pandy.  Please fax a copy of bone density result to the rheumatology clinic, if they do not already have a copy. Thanks.

## 2016-08-18 NOTE — Telephone Encounter (Signed)
Left message for patient to call back  

## 2016-08-19 NOTE — Telephone Encounter (Signed)
Notify pt.  Per Dr. Estanislado Pandy, would be okay to stop the boniva for 2 years and then reassess.  I'll defer to her, I respect her opinion.  She can finish the rx she has, then stop the med.  I made a note of this in the EMR.  Thanks.

## 2016-08-19 NOTE — Telephone Encounter (Signed)
Left message on voicemail for patient to call back. 

## 2016-08-20 NOTE — Telephone Encounter (Signed)
Patient advised.

## 2016-09-16 ENCOUNTER — Ambulatory Visit (AMBULATORY_SURGERY_CENTER): Payer: Self-pay

## 2016-09-16 VITALS — Ht 66.5 in | Wt 161.0 lb

## 2016-09-16 DIAGNOSIS — Z800 Family history of malignant neoplasm of digestive organs: Secondary | ICD-10-CM

## 2016-09-16 DIAGNOSIS — Z86010 Personal history of colonic polyps: Secondary | ICD-10-CM

## 2016-09-16 MED ORDER — NA SULFATE-K SULFATE-MG SULF 17.5-3.13-1.6 GM/177ML PO SOLN: ORAL | 0 refills | Status: DC

## 2016-09-16 NOTE — Progress Notes (Signed)
Per pt, no allergies to soy or egg products.Pt not taking any weight loss meds or using  O2 at home.   Pt refused Emmi video. 

## 2016-09-17 ENCOUNTER — Encounter: Payer: Self-pay | Admitting: Gastroenterology

## 2016-09-27 ENCOUNTER — Other Ambulatory Visit: Payer: Self-pay | Admitting: Family Medicine

## 2016-10-01 ENCOUNTER — Ambulatory Visit (AMBULATORY_SURGERY_CENTER): Payer: BC Managed Care – PPO | Admitting: Gastroenterology

## 2016-10-01 ENCOUNTER — Encounter: Payer: Self-pay | Admitting: Gastroenterology

## 2016-10-01 VITALS — BP 138/74 | HR 80 | Temp 96.0°F | Resp 13 | Ht 67.0 in | Wt 162.0 lb

## 2016-10-01 DIAGNOSIS — D122 Benign neoplasm of ascending colon: Secondary | ICD-10-CM | POA: Diagnosis not present

## 2016-10-01 DIAGNOSIS — Z1211 Encounter for screening for malignant neoplasm of colon: Secondary | ICD-10-CM

## 2016-10-01 DIAGNOSIS — Z800 Family history of malignant neoplasm of digestive organs: Secondary | ICD-10-CM

## 2016-10-01 DIAGNOSIS — Z86010 Personal history of colonic polyps: Secondary | ICD-10-CM

## 2016-10-01 DIAGNOSIS — D123 Benign neoplasm of transverse colon: Secondary | ICD-10-CM

## 2016-10-01 DIAGNOSIS — Z1212 Encounter for screening for malignant neoplasm of rectum: Secondary | ICD-10-CM | POA: Diagnosis not present

## 2016-10-01 MED ORDER — SODIUM CHLORIDE 0.9 % IV SOLN: 500.0000 mL | INTRAVENOUS | Status: DC

## 2016-10-01 NOTE — Patient Instructions (Signed)
**  Handouts given on polyps and diverticulosis. Avoid NSAIDS (Aspirin, Aleve, and Ibuprofen) for 5 days** YOU HAD AN ENDOSCOPIC PROCEDURE TODAY AT [facility]:   Refer to the procedure report that was given to you for any specific questions about what was found during the examination.  If the procedure report does not answer your questions, please call your gastroenterologist to clarify.  If you requested that your care partner not be given the details of your procedure findings, then the procedure report has been included in a sealed envelope for you to review at your convenience later.  YOU SHOULD EXPECT: Some feelings of bloating in the abdomen. Passage of more gas than usual.  Walking can help get rid of the air that was put into your GI tract during the procedure and reduce the bloating. If you had a lower endoscopy (such as a colonoscopy or flexible sigmoidoscopy) you may notice spotting of blood in your stool or on the toilet paper. If you underwent a bowel prep for your procedure, you may not have a normal bowel movement for a few days.  Please Note:  You might notice some irritation and congestion in your nose or some drainage.  This is from the oxygen used during your procedure.  There is no need for concern and it should clear up in a day or so.  SYMPTOMS TO REPORT IMMEDIATELY:   Following lower endoscopy (colonoscopy or flexible sigmoidoscopy):  Excessive amounts of blood in the stool  Significant tenderness or worsening of abdominal pains  Swelling of the abdomen that is new, acute  Fever of 100F or higher  For urgent or emergent issues, a gastroenterologist can be reached at any hour by calling [phone].   DIET:  We do recommend a small meal at first, but then you may proceed to your regular diet.  Drink plenty of fluids but you should avoid alcoholic beverages for 24 hours.  ACTIVITY:  You should plan to take it easy for the rest of today and you should NOT  DRIVE or use heavy machinery until tomorrow (because of the sedation medicines used during the test).    FOLLOW UP: Our staff will call the number listed on your records the next business day following your procedure to check on you and address any questions or concerns that you may have regarding the information given to you following your procedure. If we do not reach you, we will leave a message.  However, if you are feeling well and you are not experiencing any problems, there is no need to return our call.  We will assume that you have returned to your regular daily activities without incident.  If any biopsies were taken you will be contacted by phone or by letter within the next 1-3 weeks.  Please call us at [phone] if you have not heard about the biopsies in 3 weeks.    SIGNATURES/CONFIDENTIALITY: You and/or your care partner have signed paperwork which will be entered into your electronic medical record.  These signatures attest to the fact that that the information above on your After Visit Summary has been reviewed and is understood.  Full responsibility of the confidentiality of this discharge information lies with you and/or your care-partner.

## 2016-10-01 NOTE — Progress Notes (Signed)
Called to room to assist during endoscopic procedure.  Patient ID and intended procedure confirmed with present staff. Received instructions for my participation in the procedure from the performing physician.  

## 2016-10-01 NOTE — Progress Notes (Signed)
Report to PACU, RN, vss, BBS= Clear.  

## 2016-10-01 NOTE — Op Note (Signed)
[facility] Patient Name: April [name] Procedure Date: 10/01/2016 1:06 PM MRN: [id number] Endoscopist: Mallie Mussel L. Loletha Carrow , MD Age: 61 Referring MD:  Date of Birth: January 11, 1955 Gender: Female Account #: [id number] Procedure:                Colonoscopy Indications:              Screening in patient at increased risk: Colorectal                            cancer in mother before age 49 Medicines:                Monitored Anesthesia Care Procedure:                Pre-Anesthesia Assessment:                           - Prior to the procedure, a History and Physical                            was performed, and patient medications and                            allergies were reviewed. The patient's tolerance of                            previous anesthesia was also reviewed. The risks                            and benefits of the procedure and the sedation                            options and risks were discussed with the patient.                            All questions were answered, and informed consent                            was obtained. Anticoagulants: The patient has taken                            aspirin. It was decided not to withhold this                            medication prior to the procedure. ASA Grade                            Assessment: II - A patient with mild systemic                            disease. After reviewing the risks and benefits,                            the patient was deemed in satisfactory condition to  undergo the procedure.                           After obtaining informed consent, the colonoscope                            was passed under direct vision. Throughout the                            procedure, the patient's blood pressure, pulse, and                            oxygen saturations were monitored continuously. The                            Model PCF-[id number] scope was introduced                           through the anus and advanced to the the terminal                            ileum. The colonoscopy was performed without                            difficulty. The patient tolerated the procedure                            well. The quality of the bowel preparation was                            good. The terminal ileum, ileocecal valve,                            appendiceal orifice, and rectum were photographed.                            The quality of the bowel preparation was evaluated                            using the BBPS [facility] Bowel Preparation Scale)                            with scores of: Right Colon = 2, Transverse Colon =                            2 and Left Colon = 2. The total BBPS score equals                            6. The bowel preparation used was SUPREP. Scope In: 1:08:35 PM Scope Out: 1:40:56 PM Scope Withdrawal Time: 0 hours 25 minutes 33 seconds  Total Procedure Duration: 0 hours 32 minutes 21 seconds  Findings:                 The perianal and digital rectal examinations  were                            normal.                           The terminal ileum appeared normal.                           A 15 mm polyp was found in the proximal ascending                            colon. The polyp was sessile (probable SSP by WL                            and NBI). The polyp was removed with a piecemeal                            technique using a hot snare. Resection and                            retrieval were complete.                           A 4 mm polyp was found in the mid transverse colon.                            The polyp was sessile. The polyp was removed with a                            cold snare. Resection and retrieval were complete.                           Diverticula were found in the left colon.                           The exam was otherwise without abnormality on                            direct and  retroflexion views. Complications:            No immediate complications. Estimated Blood Loss:     Estimated blood loss: none. Impression:               - The examined portion of the ileum was normal.                           - One 15 mm polyp in the proximal ascending colon,                            removed piecemeal using a hot snare. Resected and                            retrieved.                           -  One 4 mm polyp in the mid transverse colon,                            removed with a cold snare. Resected and retrieved.                           - Diverticulosis in the left colon.                           - The examination was otherwise normal on direct                            and retroflexion views. Recommendation:           - Patient has a contact number available for                            emergencies. The signs and symptoms of potential                            delayed complications were discussed with the                            patient. Return to normal activities tomorrow.                            Written discharge instructions were provided to the                            patient.                           - Resume previous diet.                           - No aspirin, ibuprofen, naproxen, or other                            non-steroidal anti-inflammatory drugs for 5 days                            after polyp removal.                           - Await pathology results.                           - Repeat colonoscopy is recommended for                            surveillance. The colonoscopy date will be                            determined after pathology results from today's  exam become available for review. Henry L. Loletha Carrow, MD 10/01/2016 1:49:24 PM This report has been signed electronically.

## 2016-10-02 ENCOUNTER — Telehealth: Payer: Self-pay | Admitting: *Deleted

## 2016-10-02 NOTE — Telephone Encounter (Signed)
  Follow up Call-  Call back number 10/01/2016  Post procedure Call Back phone  # [phone]  Permission to leave phone message Yes  Some recent data might be hidden     Patient questions:  Do you have a fever, pain , or abdominal swelling? No. Pain Score  0 *  Have you tolerated food without any problems? Yes.    Have you been able to return to your normal activities? Yes.    Do you have any questions about your discharge instructions: Diet   No. Medications  No. Follow up visit  No.  Do you have questions or concerns about your Care? No.  Actions: * If pain score is 4 or above: No action needed, pain <4.

## 2016-10-09 ENCOUNTER — Encounter: Payer: Self-pay | Admitting: Gastroenterology

## 2016-10-16 ENCOUNTER — Ambulatory Visit (INDEPENDENT_AMBULATORY_CARE_PROVIDER_SITE_OTHER): Payer: BC Managed Care – PPO

## 2016-10-16 DIAGNOSIS — Z23 Encounter for immunization: Secondary | ICD-10-CM | POA: Diagnosis not present

## 2016-11-25 ENCOUNTER — Ambulatory Visit: Payer: Self-pay

## 2016-11-25 NOTE — Telephone Encounter (Signed)
Agreed.  Thanks.  

## 2016-11-25 NOTE — Telephone Encounter (Signed)
Unable to find slot for today or Friday. Advised pt to go to [facility]. Advised not to drive.  Reason for Disposition . [1] MODERATE dizziness (e.g., vertigo; feels very unsteady, interferes with normal activities) AND [2] has been evaluated by physician for this  Answer Assessment - Initial Assessment Questions 1. DESCRIPTION: "Describe your dizziness."     dizziness "right inside my head" states that the room is not spinning, "it just feel like it is in my head" 2. VERTIGO: "Do you feel like either you or the room is spinning or tilting?"      "no not really" 3. LIGHTHEADED: "Do you feel lightheaded?" (e.g., somewhat faint, woozy, weak upon standing)     Yes "woozy" 4. SEVERITY: "How bad is it?"  "Can you walk?"   - MILD - Feels unsteady but walking normally.   - MODERATE - Feels very unsteady when walking, but not falling; interferes with normal activities (e.g., school, work) .   - SEVERE - Unable to walk without falling (requires assistance).     "I walk like I;m drunk. I cant walk a straight line" "veering toward the left" 5. ONSET:  "When did the dizziness begin?"     1 week ago has worsened past 2 days 6. AGGRAVATING FACTORS: "Does anything make it worse?" (e.g., standing, change in head position)     "changing head position makes it worse" 7. CAUSE: "What do you think is causing the dizziness?"     "vertigo" 8. RECURRENT SYMPTOM: "Have you had dizziness before?" If so, ask: "When was the last time?" "What happened that time?"     Yes she has been seen for this before- No medication but did the exercises. Pt states "I've been doing the exercises but they are not helping" 9. OTHER SYMPTOMS: "Do you have any other symptoms?" (e.g., headache, weakness, numbness, vomiting, earache)     C/O nausea Had a headache 2 days last week 10. PREGNANCY: "Is there any chance you are pregnant?" "When was your last menstrual period?"       n/a  Protocols used: DIZZINESS - VERTIGO-A-AH

## 2016-12-04 ENCOUNTER — Ambulatory Visit: Payer: BC Managed Care – PPO | Admitting: Rheumatology
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
## 2016-12-30 ENCOUNTER — Encounter: Payer: Self-pay | Admitting: Rheumatology

## 2016-12-30 ENCOUNTER — Ambulatory Visit (INDEPENDENT_AMBULATORY_CARE_PROVIDER_SITE_OTHER): Payer: Self-pay

## 2016-12-30 ENCOUNTER — Ambulatory Visit: Payer: BC Managed Care – PPO | Admitting: Rheumatology

## 2016-12-30 VITALS — BP 140/90 | HR 95 | Resp 16 | Ht 66.0 in | Wt 167.0 lb

## 2016-12-30 DIAGNOSIS — Z8659 Personal history of other mental and behavioral disorders: Secondary | ICD-10-CM | POA: Diagnosis not present

## 2016-12-30 DIAGNOSIS — M92212 Osteochondrosis (juvenile) of carpal lunate [Kienbock], left hand: Secondary | ICD-10-CM

## 2016-12-30 DIAGNOSIS — M542 Cervicalgia: Secondary | ICD-10-CM

## 2016-12-30 DIAGNOSIS — M353 Polymyalgia rheumatica: Secondary | ICD-10-CM | POA: Diagnosis not present

## 2016-12-30 DIAGNOSIS — Z8709 Personal history of other diseases of the respiratory system: Secondary | ICD-10-CM | POA: Diagnosis not present

## 2016-12-30 DIAGNOSIS — M316 Other giant cell arteritis: Secondary | ICD-10-CM

## 2016-12-30 DIAGNOSIS — F17200 Nicotine dependence, unspecified, uncomplicated: Secondary | ICD-10-CM

## 2016-12-30 DIAGNOSIS — Z96643 Presence of artificial hip joint, bilateral: Secondary | ICD-10-CM

## 2016-12-30 DIAGNOSIS — M5136 Other intervertebral disc degeneration, lumbar region: Secondary | ICD-10-CM

## 2016-12-30 DIAGNOSIS — M160 Bilateral primary osteoarthritis of hip: Secondary | ICD-10-CM

## 2016-12-30 DIAGNOSIS — Z8639 Personal history of other endocrine, nutritional and metabolic disease: Secondary | ICD-10-CM

## 2016-12-30 DIAGNOSIS — M8589 Other specified disorders of bone density and structure, multiple sites: Secondary | ICD-10-CM

## 2016-12-30 MED ORDER — DICLOFENAC SODIUM 1 % TD GEL: TRANSDERMAL | 3 refills | Status: DC

## 2016-12-30 MED ORDER — METHOCARBAMOL 500 MG PO TABS: 500.0000 mg | ORAL_TABLET | Freq: Two times a day (BID) | ORAL | 2 refills | Status: DC | PRN

## 2016-12-30 NOTE — Patient Instructions (Signed)
Neck Exercises Neck exercises can be important for many reasons:  They can help you to improve and maintain flexibility in your neck. This can be especially important as you age.  They can help to make your neck stronger. This can make movement easier.  They can reduce or prevent neck pain.  They may help your upper back.  Ask your health care provider which neck exercises would be best for you. Exercises Neck Press Repeat this exercise 10 times. Do it first thing in the morning and right before bed or as told by your health care provider. 1. Lie on your back on a firm bed or on the floor with a pillow under your head. 2. Use your neck muscles to push your head down on the pillow and straighten your spine. 3. Hold the position as well as you can. Keep your head facing up and your chin tucked. 4. Slowly count to 5 while holding this position. 5. Relax for a few seconds. Then repeat.  Isometric Strengthening Do a full set of these exercises 2 times a day or as told by your health care provider. 1. Sit in a supportive chair and place your hand on your forehead. 2. Push forward with your head and neck while pushing back with your hand. Hold for 10 seconds. 3. Relax. Then repeat the exercise 3 times. 4. Next, do thesequence again, this time putting your hand against the back of your head. Use your head and neck to push backward against the hand pressure. 5. Finally, do the same exercise on either side of your head, pushing sideways against the pressure of your hand.  Prone Head Lifts Repeat this exercise 5 times. Do this 2 times a day or as told by your health care provider. 1. Lie face-down, resting on your elbows so that your chest and upper back are raised. 2. Start with your head facing downward, near your chest. Position your chin either on or near your chest. 3. Slowly lift your head upward. Lift until you are looking straight ahead. Then continue lifting your head as far back as  you can stretch. 4. Hold your head up for 5 seconds. Then slowly lower it to your starting position.  Supine Head Lifts Repeat this exercise 8-10 times. Do this 2 times a day or as told by your health care provider. 1. Lie on your back, bending your knees to point to the ceiling and keeping your feet flat on the floor. 2. Lift your head slowly off the floor, raising your chin toward your chest. 3. Hold for 5 seconds. 4. Relax and repeat.  Scapular Retraction Repeat this exercise 5 times. Do this 2 times a day or as told by your health care provider. 1. Stand with your arms at your sides. Look straight ahead. 2. Slowly pull both shoulders backward and downward until you feel a stretch between your shoulder blades in your upper back. 3. Hold for 10-30 seconds. 4. Relax and repeat.  Contact a health care provider if:  Your neck pain or discomfort gets much worse when you do an exercise.  Your neck pain or discomfort does not improve within 2 hours after you exercise. If you have any of these problems, stop exercising right away. Do not do the exercises again unless your health care provider says that you can. Get help right away if:  You develop sudden, severe neck pain. If this happens, stop exercising right away. Do not do the exercises again unless your   health care provider says that you can. Exercises Neck Stretch  Repeat this exercise 3-5 times. 1. Do this exercise while standing or while sitting in a chair. 2. Place your feet flat on the floor, shoulder-width apart. 3. Slowly turn your head to the right. Turn it all the way to the right so you can look over your right shoulder. Do not tilt or tip your head. 4. Hold this position for 10-30 seconds. 5. Slowly turn your head to the left, to look over your left shoulder. 6. Hold this position for 10-30 seconds.  Neck Retraction Repeat this exercise 8-10 times. Do this 3-4 times a day or as told by your health care  provider. 1. Do this exercise while standing or while sitting in a sturdy chair. 2. Look straight ahead. Do not bend your neck. 3. Use your fingers to push your chin backward. Do not bend your neck for this movement. Continue to face straight ahead. If you are doing the exercise properly, you will feel a slight sensation in your throat and a stretch at the back of your neck. 4. Hold the stretch for 1-2 seconds. Relax and repeat.  This information is not intended to replace advice given to you by your health care provider. Make sure you discuss any questions you have with your health care provider. Document Released: 12/03/2014 Document Revised: 05/30/2015 Document Reviewed: 07/02/2014 Elsevier Interactive Patient Education  2018 Elsevier Inc.  

## 2016-12-31 LAB — VITAMIN D 25 HYDROXY (VIT D DEFICIENCY, FRACTURES): Vit D, 25-Hydroxy: 46 ng/mL (ref 30–100)

## 2016-12-31 LAB — SEDIMENTATION RATE: SED RATE: 11 mm/h (ref 0–30)

## 2016-12-31 NOTE — Progress Notes (Signed)
Labs are WNL.

## 2017-01-05 DIAGNOSIS — I428 Other cardiomyopathies: Secondary | ICD-10-CM

## 2017-01-05 DIAGNOSIS — J189 Pneumonia, unspecified organism: Secondary | ICD-10-CM

## 2017-01-05 HISTORY — DX: Pneumonia, unspecified organism: J18.9

## 2017-01-05 HISTORY — DX: Other cardiomyopathies: I42.8

## 2017-04-22 ENCOUNTER — Ambulatory Visit: Payer: BC Managed Care – PPO | Admitting: Rheumatology

## 2017-05-10 ENCOUNTER — Ambulatory Visit: Payer: BC Managed Care – PPO | Admitting: Family Medicine

## 2017-05-10 ENCOUNTER — Encounter: Payer: Self-pay | Admitting: Family Medicine

## 2017-05-10 ENCOUNTER — Other Ambulatory Visit: Payer: Self-pay

## 2017-05-10 VITALS — BP 140/80 | HR 115 | Temp 98.6°F | Ht 67.0 in | Wt 166.5 lb

## 2017-05-10 DIAGNOSIS — J209 Acute bronchitis, unspecified: Secondary | ICD-10-CM | POA: Diagnosis not present

## 2017-05-10 DIAGNOSIS — J440 Chronic obstructive pulmonary disease with acute lower respiratory infection: Secondary | ICD-10-CM

## 2017-05-10 MED ORDER — PREDNISONE 20 MG PO TABS: ORAL_TABLET | ORAL | 0 refills | Status: DC

## 2017-05-10 MED ORDER — DOXYCYCLINE HYCLATE 100 MG PO TABS: 100.0000 mg | ORAL_TABLET | Freq: Two times a day (BID) | ORAL | 0 refills | Status: AC
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
## 2017-05-24 ENCOUNTER — Telehealth: Payer: Self-pay | Admitting: Family Medicine

## 2017-05-24 MED ORDER — AZITHROMYCIN 250 MG PO TABS: ORAL_TABLET | ORAL | 0 refills | Status: DC

## 2017-05-24 NOTE — Telephone Encounter (Signed)
zpak 2 tabs po on day 1, then 1 tab po for 4 days  

## 2017-05-24 NOTE — Telephone Encounter (Signed)
Copied from [place] [phone]. Topic: Quick Communication - See Telephone Encounter >> May 24, 2017  8:05 AM Aurelio Brash B wrote: CRM for notification. See Telephone encounter for: 05/24/17.  PT saw Dr Lorelei Pont 5/6  and is still having a cough with green  mucus  and is asking if she can get antibiotic sent in  PT is leaving today   around 1230   for a out of town trip  [id number] - [place], [place] - [address] [phone] (Phone) [phone] (Fax)

## 2017-05-24 NOTE — Telephone Encounter (Signed)
Left message for Ms. Rampy that Dr. Lorelei Pont has sent her in a [facility] to [facility] [place].
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
## 2017-06-23 ENCOUNTER — Encounter: Payer: Self-pay | Admitting: Internal Medicine

## 2017-06-23 ENCOUNTER — Ambulatory Visit (INDEPENDENT_AMBULATORY_CARE_PROVIDER_SITE_OTHER)
Admission: RE | Admit: 2017-06-23 | Discharge: 2017-06-23 | Disposition: A | Discharge status: Home / Self Care | Payer: BC Managed Care – PPO | Source: Ambulatory Visit | Attending: Internal Medicine | Admitting: Internal Medicine

## 2017-06-23 ENCOUNTER — Ambulatory Visit: Payer: Self-pay | Admitting: Family Medicine

## 2017-06-23 ENCOUNTER — Encounter: Payer: Self-pay | Admitting: Rheumatology

## 2017-06-23 ENCOUNTER — Ambulatory Visit: Payer: BC Managed Care – PPO | Admitting: Internal Medicine

## 2017-06-23 ENCOUNTER — Ambulatory Visit: Payer: BC Managed Care – PPO | Admitting: Rheumatology

## 2017-06-23 VITALS — BP 148/92 | HR 97 | Resp 17 | Ht 66.0 in | Wt 170.0 lb

## 2017-06-23 VITALS — BP 186/110 | HR 123 | Ht 66.0 in | Wt 169.0 lb

## 2017-06-23 DIAGNOSIS — M5136 Other intervertebral disc degeneration, lumbar region: Secondary | ICD-10-CM | POA: Diagnosis not present

## 2017-06-23 DIAGNOSIS — H8110 Benign paroxysmal vertigo, unspecified ear: Secondary | ICD-10-CM | POA: Diagnosis not present

## 2017-06-23 DIAGNOSIS — M8589 Other specified disorders of bone density and structure, multiple sites: Secondary | ICD-10-CM

## 2017-06-23 DIAGNOSIS — J441 Chronic obstructive pulmonary disease with (acute) exacerbation: Secondary | ICD-10-CM | POA: Diagnosis not present

## 2017-06-23 DIAGNOSIS — M316 Other giant cell arteritis: Secondary | ICD-10-CM

## 2017-06-23 DIAGNOSIS — I10 Essential (primary) hypertension: Secondary | ICD-10-CM | POA: Diagnosis not present

## 2017-06-23 DIAGNOSIS — R0781 Pleurodynia: Secondary | ICD-10-CM | POA: Diagnosis not present

## 2017-06-23 DIAGNOSIS — Z8659 Personal history of other mental and behavioral disorders: Secondary | ICD-10-CM

## 2017-06-23 DIAGNOSIS — Z96643 Presence of artificial hip joint, bilateral: Secondary | ICD-10-CM | POA: Diagnosis not present

## 2017-06-23 DIAGNOSIS — Z8709 Personal history of other diseases of the respiratory system: Secondary | ICD-10-CM

## 2017-06-23 DIAGNOSIS — R05 Cough: Secondary | ICD-10-CM | POA: Diagnosis not present

## 2017-06-23 DIAGNOSIS — Z8639 Personal history of other endocrine, nutritional and metabolic disease: Secondary | ICD-10-CM

## 2017-06-23 DIAGNOSIS — F17200 Nicotine dependence, unspecified, uncomplicated: Secondary | ICD-10-CM

## 2017-06-23 DIAGNOSIS — M353 Polymyalgia rheumatica: Secondary | ICD-10-CM | POA: Diagnosis not present

## 2017-06-23 MED ORDER — LEVOFLOXACIN 500 MG PO TABS: 500.0000 mg | ORAL_TABLET | Freq: Every day | ORAL | 0 refills | Status: AC

## 2017-06-23 MED ORDER — MECLIZINE HCL 12.5 MG PO TABS: 12.5000 mg | ORAL_TABLET | Freq: Three times a day (TID) | ORAL | 1 refills | Status: DC | PRN

## 2017-06-23 MED ORDER — PREDNISONE 10 MG PO TABS: ORAL_TABLET | ORAL | 0 refills | Status: DC

## 2017-06-23 MED ORDER — HYDROCODONE-HOMATROPINE 5-1.5 MG/5ML PO SYRP: 5.0000 mL | ORAL_SOLUTION | Freq: Four times a day (QID) | ORAL | 0 refills | Status: AC | PRN

## 2017-06-23 MED ORDER — METHOCARBAMOL 500 MG PO TABS: 500.0000 mg | ORAL_TABLET | Freq: Two times a day (BID) | ORAL | 2 refills | Status: DC | PRN

## 2017-06-23 MED ORDER — METHYLPREDNISOLONE ACETATE 80 MG/ML IJ SUSP
80.0000 mg | Freq: Once | INTRAMUSCULAR | Status: AC
  Administered 2017-06-23: 80 mg via INTRAMUSCULAR

## 2017-06-23 MED ORDER — AMLODIPINE BESYLATE 5 MG PO TABS: 5.0000 mg | ORAL_TABLET | Freq: Every day | ORAL | 11 refills | Status: DC

## 2017-06-23 NOTE — Assessment & Plan Note (Signed)
For cxr, r/o pna

## 2017-06-23 NOTE — Assessment & Plan Note (Signed)
Likely reactive but has been borderline prior to illness, will add amlodipine 5 qd for now

## 2017-06-23 NOTE — Progress Notes (Signed)
Subjective:    Patient ID: April [name], female    DOB: 03-20-1955, 62 y.o.   MRN: [id number]  HPI Here with 5 days onset Here with acute onset mild to mod 2-3 days ST, HA, general weakness and malaise, with prod cough greenish sputum, but Pt denies orthopnea, PND, increased LE swelling, palpitations, dizziness or syncope, but has had mild Cp located mid and right chest with sharpness with deep breaths; has hx of copd, has occas cough at baseline but now worse in the past wk.  Has seen rheumatology for PMR yesterday who asked her to see [facility] asap.  BP has been elevated with illness, maybe assoc with HA, Also has intermittent positional vertigo like sensations worse to sitting up or turning over in bed.  BP Readings from Last 3 Encounters:  06/23/17 (!) 186/110  06/23/17 (!) 148/92  05/10/17 140/80   Past Medical History:  Diagnosis Date  . Anxiety   . Anxiety and depression    related to caring for mother during terminal illness  . Arthritis   . Asthma   . AVN (avascular necrosis of bone) (HCC)    hip and wrist  . Bronchitis    hx of  . COPD (chronic obstructive pulmonary disease) ([place])   . Depression   . Endometriosis   . FH: CAD (coronary artery disease)   . History of palpitations   . Hypothyroid   . Osteopenia    forteo through Dr. Estanislado Pandy (started 10/12)  . PMR (polymyalgia rheumatica) (HCC)   . Smoker   . SOB (shortness of breath) on exertion   . Squamous cell carcinoma    facial, 2016  . Temporal arteritis (HCC)    s/p prednisone taper   Past Surgical History:  Procedure Laterality Date  . ABDOMINAL ADHESION SURGERY    . ABDOMINAL HYSTERECTOMY    . BREAST SURGERY Left    benign bx 1990  . CARDIAC CATHETERIZATION  2007   no PCI  . CHOLECYSTECTOMY    . COLONOSCOPY W/ POLYPECTOMY    . INCONTINENCE SURGERY     2007   . JOINT REPLACEMENT Left 2012   left hip  . TONSILLECTOMY    . TOTAL HIP ARTHROPLASTY     L hip 2012  . TOTAL HIP ARTHROPLASTY Right  10/04/2012   Procedure: TOTAL HIP ARTHROPLASTY;  Surgeon: Garald Balding, MD;  Location: [place];  Service: Orthopedics;  Laterality: Right;  . WRIST SURGERY     2012/ left wrist/ bone removed due to necrosis    reports that she has been smoking cigarettes.  She has a 11.22 pack-year smoking history. She has never used smokeless tobacco. She reports that she drinks alcohol. She reports that she does not use drugs. family history includes Alzheimer's disease in her mother; Arthritis in her brother; Breast cancer in her maternal aunt; Colon cancer in her mother; Colon polyps in her brother; Healthy in her son; Heart disease in her mother; Hypertension in her mother; Kidney failure in her mother; Suicidality in her brother. Allergies  Allergen Reactions  . Sulfonamide Derivatives     REACTION: As a child.  Throat closed, rash.  . Alendronate Sodium     GI upset  . Dilaudid [Hydromorphone Hcl] Nausea And Vomiting   Current Outpatient Medications on File Prior to Visit  Medication Sig Dispense Refill  . acetaminophen (TYLENOL) 325 MG tablet Take 2 tablets (650 mg total) by mouth every 6 (six) hours as needed.    Marland Kitchen  albuterol (VENTOLIN HFA) 108 (90 Base) MCG/ACT inhaler INHALE 2 PUFFS BY MOUTH EVERY 6 HOURS AS NEEDED FOR WHEEZING OR SHORTNESS OF BREATH. 18 g prn  . aspirin EC 81 MG tablet Take 81 mg by mouth every other day.     Marland Kitchen Bioflavonoid Products (VITAMIN C) CHEW Chew by mouth 2 (two) times daily.     Marland Kitchen buPROPion (WELLBUTRIN XL) 300 MG 24 hr tablet TAKE 1 TABLET (300 MG TOTAL) BY MOUTH DAILY. 90 tablet 3  . Calcium 500 MG CHEW Chew by mouth 2 (two) times daily.    . Cholecalciferol (EQL VITAMIN D3) 1000 UNITS tablet Take 3 tablets (3,000 Units total) by mouth daily.    . Cyanocobalamin (VITAMIN B12 PO) Take by mouth 2 (two) times daily.    . Fluticasone-Salmeterol (ADVAIR DISKUS) 250-50 MCG/DOSE AEPB INHALE 1 PUFF BY MOUTH 2 TIMES DAILY. 60 each prn  . Ivermectin (SOOLANTRA) 1 % CREA Apply  topically daily.    Marland Kitchen levothyroxine (SYNTHROID, LEVOTHROID) 125 MCG tablet TAKE 1 TABLET (125 MCG TOTAL) BY MOUTH DAILY. EXCEPT FOR 1.5 TABS ON SUNDAYS 100 tablet 3  . methocarbamol (ROBAXIN) 500 MG tablet Take 1 tablet (500 mg total) by mouth 2 (two) times daily as needed for muscle spasms. 60 tablet 2  . Multiple Vitamin (MULTIVITAMIN WITH MINERALS) TABS tablet Take 1 tablet by mouth every other day.     Marland Kitchen OVER THE COUNTER MEDICATION     . Tiotropium Bromide Monohydrate (SPIRIVA RESPIMAT) 2.5 MCG/ACT AERS Inhale 2 puffs into the lungs daily. 4 g 12   No current facility-administered medications on file prior to visit.    Review of Systems  Constitutional: Negative for other unusual diaphoresis or sweats HENT: Negative for ear discharge or swelling Eyes: Negative for other worsening visual disturbances Respiratory: Negative for stridor or other swelling  Gastrointestinal: Negative for worsening distension or other blood Genitourinary: Negative for retention or other urinary change Musculoskeletal: Negative for other MSK pain or swelling Skin: Negative for color change or other new lesions Neurological: Negative for worsening tremors and other numbness  Psychiatric/Behavioral: Negative for worsening agitation or other fatigue All other system neg per pt    Objective:   Physical Exam BP (!) 186/110   Pulse (!) 123   Ht 5\' 6"  (1.676 m)   Wt 169 lb (76.7 kg)   SpO2 96%   BMI 27.28 kg/m  VS noted, mild ill Constitutional: Pt appears in NAD HENT: Head: NCAT.  Right Ear: External ear normal.  Left Ear: External ear normal.  Eyes: . Pupils are equal, round, and reactive to light. Conjunctivae and EOM are normal Bilat tm's with mild erythema.  Max sinus areas non tender.  Pharynx with mild erythema, no exudate Nose: without d/c or deformity Neck: Neck supple. Gross normal ROM Cardiovascular: Normal rate and regular rhythm.   Pulmonary/Chest: Effort normal and breath sounds decreased  without rales but with mild bilat wheezing.  Abd:  Soft, NT, ND, + BS, no organomegaly Neurological: Pt is alert. At baseline orientation, motor grossly intact Skin: Skin is warm. No rashes, other new lesions, no LE edema Psychiatric: Pt behavior is normal without agitation  No other exam findings    Assessment & Plan:

## 2017-06-23 NOTE — Assessment & Plan Note (Signed)
Mild to mod, for depomedrol IM 80, predpac asd, for antibx course, cough med prn, cont inhalers, to f/u any worsening symptoms or concerns

## 2017-06-23 NOTE — Telephone Encounter (Signed)
Patients Bp elevated while at Dr Delanna Ahmadi office today - She reports some dizziness as well. Pt has copd  Had some mild shortness of breath that is relieved by her inhaler.She reports some r sided chest soreness  When she takes a deep breath  That is intermittent. She is awake and alert oriented speaking in complete sentances.No availability at [facility] today pt is in [place] at this time. Patient reports she would rather be seen at a [place] facility today as she is in [place] Appt made with Dr Jenny Reichmann today. Advised not to drive   Reason for Disposition . [1] MODERATE dizziness (e.g., interferes with normal activities) AND [2] has NOT been evaluated by physician for this  (Exception: dizziness caused by heat exposure, sudden standing, or poor fluid intake)  Answer Assessment - Initial Assessment Questions 1. DESCRIPTION: "Describe your dizziness."      Lightheaded - Worse when prone and stands up   2. LIGHTHEADED: "Do you feel lightheaded?" (e.g., somewhat faint, woozy, weak upon standing)      Woozy   3. VERTIGO: "Do you feel like either you or the room is spinning or tilting?" (i.e. vertigo)        Slight vertigo 2 days none now  4. SEVERITY: "How bad is it?"  "Do you feel like you are going to faint?" "Can you stand and walk?"   - MILD - walking normally   - MODERATE - interferes with normal activities (e.g., work, school)    - SEVERE - unable to stand, requires support to walk, feels like passing out now.          Mild  5. ONSET:  "When did the dizziness begin?"          4-5 days ago off and on  Had episode about 45 mins  Ago at  rheumotologists office   6. AGGRAVATING FACTORS: "Does anything make it worse?" (e.g., standing, change in head position)       Movement changes from prone to standing   7. HEART RATE: "Can you tell me your heart rate?" "How many beats in 15 seconds?"  (Note: not all patients can do this)           Unable  8. CAUSE: "What do you think is causing the  dizziness?"     Bo was elevated  Today 148/92  9. RECURRENT SYMPTOM: "Have you had dizziness before?" If so, ask: "When was the last time?" "What happened that time?"     Has had in past  Had 2 days - passed   10. OTHER SYMPTOMS: "Do you have any other symptoms?" (e.g., fever, chest pain, vomiting, diarrhea, bleeding)     R sided chest intermittent when takes a deep breath bp elevated 148/92   11. PREGNANCY: "Is there any chance you are pregnant?" "When was your last menstrual period?"           Partial Hysterectomy  Protocols used: DIZZINESS [facility]

## 2017-06-23 NOTE — Assessment & Plan Note (Signed)
Ok for meclizine prn,  to f/u any worsening symptoms or concerns 

## 2017-06-23 NOTE — Patient Instructions (Signed)
You had the steroid shot today -   Please take all new medication as prescribed - the antibiotic, cough medicine if needed, prednisone, and antivert for dizziness, and the amlodipine 5 mg per day for blood pressure  Please continue all other medications as before, including the Inhalers  Please have the pharmacy call with any other refills you may need.  Please keep your appointments with your specialists as you may have planned  Please go to the XRAY Department in the Basement (go straight as you get off the elevator) for the x-ray testing  You will be contacted by phone if any changes need to be made immediately.  Otherwise, you will receive a letter about your results with an explanation, but please check with MyChart first.  Please remember to sign up for MyChart if you have not done so, as this will be important to you in the future with finding out test results, communicating by private email, and scheduling acute appointments online when needed.

## 2017-06-24 ENCOUNTER — Encounter: Payer: Self-pay | Admitting: Internal Medicine

## 2017-06-28 ENCOUNTER — Encounter: Payer: Self-pay | Admitting: Family Medicine

## 2017-06-28 ENCOUNTER — Other Ambulatory Visit: Payer: Self-pay | Admitting: Family Medicine

## 2017-06-28 ENCOUNTER — Ambulatory Visit: Payer: BC Managed Care – PPO | Admitting: Family Medicine

## 2017-06-28 VITALS — BP 124/84 | HR 106 | Temp 98.2°F | Ht 66.0 in | Wt 172.8 lb

## 2017-06-28 DIAGNOSIS — J449 Chronic obstructive pulmonary disease, unspecified: Secondary | ICD-10-CM

## 2017-06-28 DIAGNOSIS — Z1231 Encounter for screening mammogram for malignant neoplasm of breast: Secondary | ICD-10-CM

## 2017-06-28 DIAGNOSIS — E039 Hypothyroidism, unspecified: Secondary | ICD-10-CM

## 2017-06-28 DIAGNOSIS — I10 Essential (primary) hypertension: Secondary | ICD-10-CM | POA: Diagnosis not present

## 2017-06-28 DIAGNOSIS — F17200 Nicotine dependence, unspecified, uncomplicated: Secondary | ICD-10-CM | POA: Diagnosis not present

## 2017-06-28 DIAGNOSIS — R000 Tachycardia, unspecified: Secondary | ICD-10-CM

## 2017-06-28 NOTE — Patient Instructions (Signed)
Labs today I do think rapid heart rate is coming from combination of albuterol use and recent illness - should improve over time. Make sure you're staying well hydrated.  If persistent, let us know.  Ok to continue amlodipine 2.5mg  daily.

## 2017-06-28 NOTE — Progress Notes (Signed)
BP 124/84 (BP Location: Left Arm, Patient Position: Sitting, Cuff Size: Normal)   Pulse (!) 106   Temp 98.2 F (36.8 C) (Oral)   Ht 5\' 6"  (1.676 m)   Wt 172 lb 12 oz (78.4 kg)   SpO2 96%   BMI 27.88 kg/m    CC: recheck blood pressure Subjective:    Patient ID: April [name], female    DOB: 11-05-1955, 62 y.o.   MRN: [id number]  HPI: April [name] is a 62 y.o. female presenting on 06/28/2017 for Elevated BP and HR (Was seen last week at [facility] office. Says BP has come down since then but HR is still elevated. Was given amlodipine 5 mg tab. Pt states she is only taking 1/2 tab, concerned it would cause BP to go too low. Was also prescribed prednisone, meclizine and Levaquin. Pt provided recent home BP and HR readings.)   Seen last week at [facility] clinic with dx COPD exacerbation, treated with depo medrol IM 80mg , pred pack, levaquin course, hycodan cough med PRN. At that time BP 180/110, HR 123. She had some chest discomfort that has since improved (with bronchitis treatment). Amlodipine 5mg  was started - she has been taking 1/2 tablet at a time. CXR at that time showed COPD with no pneumonia. Prior to this she was treated for bronchitis with zpack (05/2017).   Blood pressures are doing better - 106-140/60-70 in AM, 110-130/70-90s in PM, HR remaining 90-120s. Brings log which was reviewed. She feels heart pounding out of her chest. No chest pain with this, no dyspnea. Dizziness has improved with better BP control. She has noticed some ankle swelling and some flushing on amlodipine. She has been taking albuterol once daily.   Down to 2-3 cig/day.  Known COPD - regularly takes advair and spiriva.   Lab Results  Component Value Date   TSH 4.01 08/12/2016     Relevant past medical, surgical, family and social history reviewed and updated as indicated. Interim medical history since our last visit reviewed. Allergies and medications reviewed and updated. Outpatient Medications Prior to Visit   Medication Sig Dispense Refill  . acetaminophen (TYLENOL) 325 MG tablet Take 2 tablets (650 mg total) by mouth every 6 (six) hours as needed.    Marland Kitchen albuterol (VENTOLIN HFA) 108 (90 Base) MCG/ACT inhaler INHALE 2 PUFFS BY MOUTH EVERY 6 HOURS AS NEEDED FOR WHEEZING OR SHORTNESS OF BREATH. 18 g prn  . amLODipine (NORVASC) 5 MG tablet Take 1 tablet (5 mg total) by mouth daily. (Patient taking differently: Take 5 mg by mouth daily. Taking 1/2 tablet daily) 30 tablet 11  . aspirin EC 81 MG tablet Take 81 mg by mouth every other day.     Marland Kitchen Bioflavonoid Products (VITAMIN C) CHEW Chew by mouth 2 (two) times daily.     Marland Kitchen buPROPion (WELLBUTRIN XL) 300 MG 24 hr tablet TAKE 1 TABLET (300 MG TOTAL) BY MOUTH DAILY. 90 tablet 3  . Calcium 500 MG CHEW Chew by mouth 2 (two) times daily.    . Cholecalciferol (EQL VITAMIN D3) 1000 UNITS tablet Take 3 tablets (3,000 Units total) by mouth daily.    . Cyanocobalamin (VITAMIN B12 PO) Take by mouth 2 (two) times daily. Takes 1 tablet every 3 days    . Fluticasone-Salmeterol (ADVAIR DISKUS) 250-50 MCG/DOSE AEPB INHALE 1 PUFF BY MOUTH 2 TIMES DAILY. 60 each prn  . HYDROcodone-homatropine (HYCODAN) 5-1.5 MG/5ML syrup Take 5 mLs by mouth every 6 (six) hours as needed for  up to 10 days for cough. 180 mL 0  . Ivermectin (SOOLANTRA) 1 % CREA Apply topically daily.    Marland Kitchen levofloxacin (LEVAQUIN) 500 MG tablet Take 1 tablet (500 mg total) by mouth daily for 10 days. 10 tablet 0  . levothyroxine (SYNTHROID, LEVOTHROID) 125 MCG tablet TAKE 1 TABLET (125 MCG TOTAL) BY MOUTH DAILY. EXCEPT FOR 1.5 TABS ON SUNDAYS 100 tablet 3  . meclizine (ANTIVERT) 12.5 MG tablet Take 1 tablet (12.5 mg total) by mouth 3 (three) times daily as needed for dizziness. 30 tablet 1  . methocarbamol (ROBAXIN) 500 MG tablet Take 1 tablet (500 mg total) by mouth 2 (two) times daily as needed for muscle spasms. 60 tablet 2  . Multiple Vitamin (MULTIVITAMIN WITH MINERALS) TABS tablet Take 1 tablet by mouth every  other day.     Marland Kitchen OVER THE COUNTER MEDICATION     . predniSONE (DELTASONE) 10 MG tablet 3 tabs by mouth per day for 3 days,2tabs per day for 3 days,1tab per day for 3 days 18 tablet 0  . Tiotropium Bromide Monohydrate (SPIRIVA RESPIMAT) 2.5 MCG/ACT AERS Inhale 2 puffs into the lungs daily. 4 g 12   No facility-administered medications prior to visit.      Per HPI unless specifically indicated in ROS section below Review of Systems     Objective:    BP 124/84 (BP Location: Left Arm, Patient Position: Sitting, Cuff Size: Normal)   Pulse (!) 106   Temp 98.2 F (36.8 C) (Oral)   Ht 5\' 6"  (1.676 m)   Wt 172 lb 12 oz (78.4 kg)   SpO2 96%   BMI 27.88 kg/m   Wt Readings from Last 3 Encounters:  06/28/17 172 lb 12 oz (78.4 kg)  06/23/17 169 lb (76.7 kg)  06/23/17 170 lb (77.1 kg)    Physical Exam  Constitutional: She appears well-developed and well-nourished. No distress.  HENT:  Mouth/Throat: Oropharynx is clear and moist. No oropharyngeal exudate.  Eyes: Pupils are equal, round, and reactive to light. Conjunctivae are normal.  Cardiovascular: Regular rhythm and normal heart sounds.  No extrasystoles are present. Tachycardia present.  No murmur heard. Sounds very regular  Pulmonary/Chest: Effort normal and breath sounds normal. No respiratory distress. She has no wheezes. She has no rales.  Faint crackles RLL  Musculoskeletal: She exhibits no edema.  Nursing note and vitals reviewed.  Results for orders placed or performed in visit on 12/30/16  VITAMIN D 25 Hydroxy (Vit-D Deficiency, Fractures)  Result Value Ref Range   Vit D, 25-Hydroxy 46 30 - 100 ng/mL  Sedimentation rate  Result Value Ref Range   Sed Rate 11 0 - 30 mm/h      Assessment & Plan:   Problem List Items Addressed This Visit    Tachycardia - Primary    She does have reasons for recent tachycardia - recent bronchitis illness, recent albuterol use, possibly mildly dehydrated after recent illness. Reviewed  this. Rec continue to monitor and update [place] if persistent tachycardia, consider changing amlodipine to beta blocker. Will also update labs r/o other causes of tachycardia      Relevant Orders   Basic metabolic panel   CBC with Differential/Platelet   SMOKER    Continue to encourage cessation. Down to 2-3 cig/day!      Hypothyroidism    Update labs r/o contribution to tachycardia/hypertension.       Relevant Orders   CBC with Differential/Platelet   TSH   T4, free  HTN (hypertension)    Acutely worse after recent bronchitis illness. Better controlled with amlodipine 2.5mg  daily (taking 1/2 of 5mg  tablet). Log she brings shows fluctuating blood pressures with some borderline elevated readings on amlodipine. She is experiencing side effects to amlodipine (namely flushing) - consider changing to other (?beta blocker) if persistent or bothersome.       COPD (chronic obstructive pulmonary disease) (HCC)    Continue advair, spiriva, albuterol PRN.           No orders of the defined types were placed in this encounter.  Orders Placed This Encounter  Procedures  . Basic metabolic panel  . CBC with Differential/Platelet  . TSH  . T4, free    Follow up plan: Return if symptoms worsen or fail to improve.  Ria Bush, MD

## 2017-06-29 DIAGNOSIS — R000 Tachycardia, unspecified: Secondary | ICD-10-CM | POA: Insufficient documentation

## 2017-06-29 LAB — BASIC METABOLIC PANEL
BUN: 20 mg/dL (ref 6–23)
CHLORIDE: 103 meq/L (ref 96–112)
CO2: 30 meq/L (ref 19–32)
CREATININE: 1.07 mg/dL (ref 0.40–1.20)
Calcium: 9.9 mg/dL (ref 8.4–10.5)
GFR: 54.82 mL/min — ABNORMAL LOW (ref >60.00)
Glucose, Bld: 114 mg/dL — ABNORMAL HIGH (ref 70–99)
POTASSIUM: 4 meq/L (ref 3.5–5.1)
Sodium: 140 mEq/L (ref 135–145)

## 2017-06-29 LAB — CBC WITH DIFFERENTIAL/PLATELET
Basophils Absolute: 0.1 10*3/uL (ref 0.0–0.1)
Basophils Relative: 1.2 % (ref 0.0–3.0)
EOS ABS: 0 10*3/uL (ref 0.0–0.7)
EOS PCT: 0.3 % (ref 0.0–5.0)
HEMATOCRIT: 40.8 % (ref 36.0–46.0)
HEMOGLOBIN: 13.9 g/dL (ref 12.0–15.0)
Lymphocytes Relative: 18 % (ref 12.0–46.0)
Lymphs Abs: 1.6 10*3/uL (ref 0.7–4.0)
MCHC: 34 g/dL (ref 30.0–36.0)
MCV: 92.7 fl (ref 78.0–100.0)
MONO ABS: 0.5 10*3/uL (ref 0.1–1.0)
Monocytes Relative: 5.6 % (ref 3.0–12.0)
NEUTROS ABS: 6.5 10*3/uL (ref 1.4–7.7)
Neutrophils Relative %: 74.9 % (ref 43.0–77.0)
PLATELETS: 219 10*3/uL (ref 150.0–400.0)
RBC: 4.4 Mil/uL (ref 3.87–5.11)
RDW: 15.2 % (ref 11.5–15.5)
WBC: 8.7 10*3/uL (ref 4.0–10.5)

## 2017-06-29 LAB — T4, FREE: FREE T4: 1.01 ng/dL (ref 0.60–1.60)

## 2017-06-29 LAB — TSH: TSH: 3.34 u[IU]/mL (ref 0.35–4.50)

## 2017-06-29 NOTE — Assessment & Plan Note (Signed)
Update labs r/o contribution to tachycardia/hypertension.

## 2017-06-29 NOTE — Assessment & Plan Note (Signed)
Continue to encourage cessation. Down to 2-3 cig/day!

## 2017-06-29 NOTE — Assessment & Plan Note (Addendum)
She does have reasons for recent tachycardia - recent bronchitis illness, recent albuterol use, possibly mildly dehydrated after recent illness. Reviewed this. Rec continue to monitor and update [place] if persistent tachycardia, consider changing amlodipine to beta blocker. Will also update labs r/o other causes of tachycardia

## 2017-06-29 NOTE — Assessment & Plan Note (Signed)
Continue advair, spiriva, albuterol PRN.

## 2017-06-29 NOTE — Assessment & Plan Note (Addendum)
Acutely worse after recent bronchitis illness. Better controlled with amlodipine 2.5mg  daily (taking 1/2 of 5mg  tablet). Log she brings shows fluctuating blood pressures with some borderline elevated readings on amlodipine. She is experiencing side effects to amlodipine (namely flushing) - consider changing to other (?beta blocker) if persistent or bothersome.

## 2017-07-16 ENCOUNTER — Ambulatory Visit: Payer: Self-pay

## 2017-07-16 ENCOUNTER — Ambulatory Visit: Payer: BC Managed Care – PPO | Admitting: Family Medicine

## 2017-07-16 ENCOUNTER — Encounter: Payer: Self-pay | Admitting: Family Medicine

## 2017-07-16 VITALS — BP 156/98 | HR 118 | Temp 98.4°F | Ht 66.0 in | Wt 170.5 lb

## 2017-07-16 DIAGNOSIS — J441 Chronic obstructive pulmonary disease with (acute) exacerbation: Secondary | ICD-10-CM | POA: Diagnosis not present

## 2017-07-16 DIAGNOSIS — R000 Tachycardia, unspecified: Secondary | ICD-10-CM

## 2017-07-16 MED ORDER — DOXYCYCLINE HYCLATE 100 MG PO TABS: 100.0000 mg | ORAL_TABLET | Freq: Two times a day (BID) | ORAL | 0 refills | Status: DC

## 2017-07-16 MED ORDER — METOPROLOL SUCCINATE ER 25 MG PO TB24: 25.0000 mg | ORAL_TABLET | Freq: Every day | ORAL | 3 refills | Status: DC

## 2017-07-16 MED ORDER — AMLODIPINE BESYLATE 5 MG PO TABS: 2.5000 mg | ORAL_TABLET | Freq: Every day | ORAL | Status: DC

## 2017-07-16 MED ORDER — PREDNISONE 20 MG PO TABS: ORAL_TABLET | ORAL | 0 refills | Status: DC

## 2017-07-16 NOTE — Patient Instructions (Signed)
Cough- start doxy, use your inhalers.  Start prednisone with food.   BP and pulse- when better stop amlodipine and change to metoprolol.  Update me about 1 week after the change with your BP and pulse.   Take care.  Glad to see you.

## 2017-07-16 NOTE — Telephone Encounter (Signed)
Pt made appt to see Dr Damita Dunnings 07/16/17 at 4 PM. Pt appreciative.

## 2017-07-16 NOTE — Progress Notes (Signed)
She isn't taking mucinex D.  She is avoiding "-D" meds due to baseline tachycardia.  D/w pt. home blood pressure and heart rate recordings reviewed.  Her blood pressure is usually controlled with systolic 657 or less usually and diastolic 90X or less.  Her baseline heart rate appears to be between 100 and 120 most of the time.  She was gradually improving and then her sx got worse in the meantime.  Was then seen by Dr. Veronia Beets, OV note reviewed and d/w pt.    She has had tachycardia even when she wasn't using SABA frequently.    Resp sx worse in the last few days.  Chest is tighter.  No fevers.  Some wheeze.  Some cough, some sputum, discolored.    Smoking 2-3 cigs a day, less recently.  She is working to quit.  D/w pt about cessation.    No fevers.    Meds, vitals, and allergies reviewed.   ROS: Per HPI unless specifically indicated in ROS section   GEN: nad, alert and oriented HEENT: mucous membranes moist, tm w/o erythema, nasal exam w/o erythema, clear discharge noted,  OP with cobblestoning NECK: supple w/o LA CV: rrr.   PULM: no focal dec in BS, no wheeze ,ctab, no inc wob, cough noted.  EXT: no edema SKIN: no acute rash

## 2017-07-16 NOTE — Telephone Encounter (Signed)
Patient called in with c/o "cough, SOB." She says "Wednesday I started coughing and my voice is hoarse. I am coughing up yellow phlegm. My cough is not that bad today. I was seen by Dr. Jenny Reichmann at [facility] in June and was given an antibiotic and some other medications. When I start coughing, I do get a little short of breath. I had wheezing this morning, but after using my inhalers, it went away. I don't have a fever." I asked about other symptoms, she denies. According to protocol, see PCP within 24 hours. I offered another provider today or go to the [facility] tomorrow. She says "I want to see Dr. Damita Dunnings." Appointment scheduled for Monday, 07/19/17 at 1000 with Dr. Damita Dunnings, care advice given, patient verbalized understanding. She says "will you ask if I can get an antibiotic, the same one I had before, to get me through the weekend? If I can't get the antibiotic, I will come see another provider." I advised I will send this request to Dr. Damita Dunnings and someone will call back with his recommendation, she verbalized understanding.   Reason for Disposition . [1] Known COPD or other severe lung disease (i.e., bronchiectasis, cystic fibrosis, lung surgery) AND [2] worsening symptoms (i.e., increased sputum purulence or amount, increased breathing difficulty  Answer Assessment - Initial Assessment Questions 1. ONSET: "When did the cough begin?"      Wednesday 2. SEVERITY: "How bad is the cough today?"      Not that bad 3. RESPIRATORY DISTRESS: "Describe your breathing."      Shortness of breath 4. FEVER: "Do you have a fever?" If so, ask: "What is your temperature, how was it measured, and when did it start?"     No 5. SPUTUM: "Describe the color of your sputum" (clear, white, yellow, green)     Yellow 6. HEMOPTYSIS: "Are you coughing up any blood?" If so ask: "How much?" (flecks, streaks, tablespoons, etc.)     No 7. CARDIAC HISTORY: "Do you have any history of heart disease?" (e.g., heart attack,  congestive heart failure)      No 8. LUNG HISTORY: "Do you have any history of lung disease?"  (e.g., pulmonary embolus, asthma, emphysema)     COPD, Bronchitis 9. PE RISK FACTORS: "Do you have a history of blood clots?" (or: recent major surgery, recent prolonged travel, bedridden)     No 10. OTHER SYMPTOMS: "Do you have any other symptoms?" (e.g., runny nose, wheezing, chest pain)       A little wheezing this morning before inhalers 11. PREGNANCY: "Is there any chance you are pregnant?" "When was your last menstrual period?"       No 12. TRAVEL: "Have you traveled out of the country in the last month?" (e.g., travel history, exposures)       No  Protocols used: [place]

## 2017-07-16 NOTE — Telephone Encounter (Signed)
Can she some in at 4pm today?  Add to schedule?  Please proceed.  Thanks.

## 2017-07-18 NOTE — Assessment & Plan Note (Signed)
After she is improved from this current illness it is reasonable to start metoprolol 25 mg a day.  Routine cautions given.  I discussed with her about beta agonist use and concurrent beta-blocker use.  At this point though, the benefit of the medication likely outweighs the risk.  She has had multiple days and checks where her heart rate was persistently elevated even when she was not acutely ill.  Her heart sounds regular.  This sounds to be a benign sinus tachycardia.  She will update me as needed.  See after visit summary.  She agrees with plan. >25 minutes spent in face to face time with patient, >50% spent in counselling or coordination of care.

## 2017-07-18 NOTE — Assessment & Plan Note (Signed)
Discussed with patient about routine cautions.  Still okay for outpatient follow-up.  Start prednisone taper with routine steroid cautions.  She understood.  Continue baseline inhalers.  Use albuterol as needed.  Avoid decongestants otherwise.  Start doxycycline.  Update me if not better.  She agrees.

## 2017-07-19 ENCOUNTER — Ambulatory Visit: Payer: BC Managed Care – PPO | Admitting: Family Medicine

## 2017-08-09 ENCOUNTER — Ambulatory Visit: Payer: BC Managed Care – PPO

## 2017-08-09 ENCOUNTER — Ambulatory Visit
Admission: RE | Admit: 2017-08-09 | Discharge: 2017-08-09 | Disposition: A | Discharge status: Home / Self Care | Payer: BC Managed Care – PPO | Source: Ambulatory Visit | Attending: Family Medicine | Admitting: Family Medicine

## 2017-08-09 ENCOUNTER — Other Ambulatory Visit: Payer: Self-pay | Admitting: Family Medicine

## 2017-08-09 ENCOUNTER — Telehealth: Payer: Self-pay | Admitting: Family Medicine

## 2017-08-09 DIAGNOSIS — Z1231 Encounter for screening mammogram for malignant neoplasm of breast: Secondary | ICD-10-CM

## 2017-08-09 NOTE — Telephone Encounter (Signed)
Copied from [place] [id number]. Topic: Quick Communication - See Telephone Encounter >> Aug 09, 2017  2:13 PM Rutherford Nail, NT wrote: CRM for notification. See Telephone encounter for: 08/09/17. Marissa with Dr Arlean Hopping office calling and states that they were [place] in on the Mammogram results. Lind Guest would like to know if Dr Damita Dunnings is going to go over the results with the patient? Please aedvise.  CB#: [phone]

## 2017-08-10 ENCOUNTER — Other Ambulatory Visit: Payer: Self-pay | Admitting: Family Medicine

## 2017-08-10 DIAGNOSIS — R928 Other abnormal and inconclusive findings on diagnostic imaging of breast: Secondary | ICD-10-CM

## 2017-08-10 NOTE — Telephone Encounter (Signed)
plz notify Dr Arlean Hopping office - yes we will take care of this. Reviewing chart, it seems patient has R dx mammo scheduled for Thursday already.

## 2017-08-11 ENCOUNTER — Telehealth: Payer: Self-pay | Admitting: *Deleted

## 2017-08-11 NOTE — Telephone Encounter (Signed)
Dr. Josefine Class office called and advised [place] that they will review the results of mammogram and will discuss with the patient.

## 2017-08-11 NOTE — Telephone Encounter (Signed)
Noted  

## 2017-08-11 NOTE — Telephone Encounter (Signed)
Yes, I'll await the mammogram report. Thanks.

## 2017-08-11 NOTE — Telephone Encounter (Signed)
Seth Bake at Dr. Arlean Hopping office notified as instructed by telephone and verbalized understanding.

## 2017-08-12 ENCOUNTER — Ambulatory Visit
Admission: RE | Admit: 2017-08-12 | Discharge: 2017-08-12 | Disposition: A | Discharge status: Home / Self Care | Payer: BC Managed Care – PPO | Source: Ambulatory Visit | Attending: Family Medicine | Admitting: Family Medicine

## 2017-08-12 DIAGNOSIS — R928 Other abnormal and inconclusive findings on diagnostic imaging of breast: Secondary | ICD-10-CM

## 2017-08-16 ENCOUNTER — Other Ambulatory Visit: Payer: Self-pay | Admitting: Family Medicine

## 2017-08-16 DIAGNOSIS — I10 Essential (primary) hypertension: Secondary | ICD-10-CM

## 2017-08-16 DIAGNOSIS — M8580 Other specified disorders of bone density and structure, unspecified site: Secondary | ICD-10-CM

## 2017-08-19 ENCOUNTER — Other Ambulatory Visit (INDEPENDENT_AMBULATORY_CARE_PROVIDER_SITE_OTHER): Payer: BC Managed Care – PPO

## 2017-08-19 DIAGNOSIS — M8580 Other specified disorders of bone density and structure, unspecified site: Secondary | ICD-10-CM | POA: Diagnosis not present

## 2017-08-19 DIAGNOSIS — I10 Essential (primary) hypertension: Secondary | ICD-10-CM

## 2017-08-19 LAB — COMPREHENSIVE METABOLIC PANEL
ALT: 13 U/L (ref 0–35)
AST: 16 U/L (ref 0–37)
Albumin: 4.4 g/dL (ref 3.5–5.2)
Alkaline Phosphatase: 85 U/L (ref 39–117)
BUN: 18 mg/dL (ref 6–23)
CHLORIDE: 103 meq/L (ref 96–112)
CO2: 29 meq/L (ref 19–32)
CREATININE: 1.32 mg/dL — AB (ref 0.40–1.20)
Calcium: 10.2 mg/dL (ref 8.4–10.5)
GFR: 43 mL/min — ABNORMAL LOW (ref >60.00)
Glucose, Bld: 112 mg/dL — ABNORMAL HIGH (ref 70–99)
Potassium: 4 mEq/L (ref 3.5–5.1)
SODIUM: 139 meq/L (ref 135–145)
Total Bilirubin: 0.5 mg/dL (ref 0.2–1.2)
Total Protein: 7.8 g/dL (ref 6.0–8.3)

## 2017-08-19 LAB — LIPID PANEL
CHOL/HDL RATIO: 4
CHOLESTEROL: 202 mg/dL — AB (ref 0–200)
HDL: 56.7 mg/dL (ref >39.00)
LDL CALC: 133 mg/dL — AB (ref 0–99)
NONHDL: 145.43
Triglycerides: 62 mg/dL (ref 0.0–149.0)
VLDL: 12.4 mg/dL (ref 0.0–40.0)

## 2017-08-19 LAB — VITAMIN D 25 HYDROXY (VIT D DEFICIENCY, FRACTURES): VITD: 40.39 ng/mL (ref 30.00–100.00)

## 2017-08-23 ENCOUNTER — Telehealth: Payer: Self-pay | Admitting: Family Medicine

## 2017-08-23 NOTE — Telephone Encounter (Signed)
Check on patient.   BPs look reasonable.  Pulse appears to be improved.  Verify current dose of metoprolol.  See how she feels on med.   Let me know.  Thanks.

## 2017-08-23 NOTE — Telephone Encounter (Signed)
Patient advised and states this is not emergent sx and will speak with you at CPE appt.

## 2017-08-23 NOTE — Telephone Encounter (Signed)
I want to hear about whatever she has going on, CPE or not.  If emergent sx in the meantime then let me know.  Thanks.

## 2017-08-23 NOTE — Telephone Encounter (Signed)
Patient is coming in for a CPE tomorrow but states she realizes she is not supposed to bring up other problems at the CPE.  Patient says she is doing okay with the medication except she is experiencing blurry vision.  She also is having a pain on the right side of the chest since having the bronchitis and has SOB to the point that she has to sit down a lot.

## 2017-08-24 ENCOUNTER — Encounter: Payer: Self-pay | Admitting: Family Medicine

## 2017-08-24 ENCOUNTER — Ambulatory Visit: Payer: BC Managed Care – PPO | Admitting: Family Medicine

## 2017-08-24 VITALS — BP 110/74 | HR 105 | Temp 98.4°F | Ht 65.75 in | Wt 166.0 lb

## 2017-08-24 DIAGNOSIS — Z7189 Other specified counseling: Secondary | ICD-10-CM

## 2017-08-24 DIAGNOSIS — R7989 Other specified abnormal findings of blood chemistry: Secondary | ICD-10-CM

## 2017-08-24 DIAGNOSIS — J449 Chronic obstructive pulmonary disease, unspecified: Secondary | ICD-10-CM

## 2017-08-24 DIAGNOSIS — Z0000 Encounter for general adult medical examination without abnormal findings: Secondary | ICD-10-CM | POA: Diagnosis not present

## 2017-08-24 DIAGNOSIS — E039 Hypothyroidism, unspecified: Secondary | ICD-10-CM

## 2017-08-24 DIAGNOSIS — F341 Dysthymic disorder: Secondary | ICD-10-CM

## 2017-08-24 DIAGNOSIS — R0602 Shortness of breath: Secondary | ICD-10-CM

## 2017-08-24 LAB — BASIC METABOLIC PANEL
BUN: 15 mg/dL (ref 6–23)
CALCIUM: 10 mg/dL (ref 8.4–10.5)
CHLORIDE: 104 meq/L (ref 96–112)
CO2: 30 meq/L (ref 19–32)
Creatinine, Ser: 1.29 mg/dL — ABNORMAL HIGH (ref 0.40–1.20)
GFR: 44.16 mL/min — ABNORMAL LOW (ref >60.00)
GLUCOSE: 86 mg/dL (ref 70–99)
POTASSIUM: 3.7 meq/L (ref 3.5–5.1)
Sodium: 141 mEq/L (ref 135–145)

## 2017-08-24 MED ORDER — BUPROPION HCL ER (XL) 300 MG PO TB24: 300.0000 mg | ORAL_TABLET | Freq: Every day | ORAL | 3 refills | Status: DC

## 2017-08-24 MED ORDER — ALPRAZOLAM 0.5 MG PO TABS: 0.2500 mg | ORAL_TABLET | Freq: Two times a day (BID) | ORAL | 0 refills | Status: DC | PRN

## 2017-08-24 MED ORDER — FLUTICASONE-SALMETEROL 250-50 MCG/DOSE IN AEPB: INHALATION_SPRAY | RESPIRATORY_TRACT | 99 refills | Status: DC

## 2017-08-24 MED ORDER — ALBUTEROL SULFATE HFA 108 (90 BASE) MCG/ACT IN AERS: INHALATION_SPRAY | RESPIRATORY_TRACT | 99 refills | Status: DC

## 2017-08-24 MED ORDER — TIOTROPIUM BROMIDE MONOHYDRATE 2.5 MCG/ACT IN AERS: 2.0000 | INHALATION_SPRAY | Freq: Every day | RESPIRATORY_TRACT | 12 refills | Status: DC

## 2017-08-24 MED ORDER — LEVOTHYROXINE SODIUM 125 MCG PO TABS: 125.0000 ug | ORAL_TABLET | Freq: Every day | ORAL | 3 refills | Status: DC

## 2017-08-24 MED ORDER — METHOCARBAMOL 500 MG PO TABS: 500.0000 mg | ORAL_TABLET | Freq: Two times a day (BID) | ORAL | 3 refills | Status: DC | PRN
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
## 2017-08-24 NOTE — Patient Instructions (Addendum)
We'll contact you with your lab report. We will call about your referral.  April [name] or April [name] will call you if you don't see one of them on the way out.  Use the xanax if needed- sedation caution.   Please let me know how you are doing in the next few weeks.   Take care.  Glad to see you.  Update me as needed.

## 2017-08-26 ENCOUNTER — Telehealth: Payer: Self-pay | Admitting: *Deleted

## 2017-08-26 ENCOUNTER — Ambulatory Visit: Payer: BC Managed Care – PPO | Admitting: Internal Medicine

## 2017-08-26 ENCOUNTER — Encounter: Payer: Self-pay | Admitting: Internal Medicine

## 2017-08-26 VITALS — BP 138/90 | HR 86 | Temp 98.3°F | Wt 166.5 lb

## 2017-08-26 DIAGNOSIS — R079 Chest pain, unspecified: Secondary | ICD-10-CM | POA: Diagnosis not present

## 2017-08-26 DIAGNOSIS — R7989 Other specified abnormal findings of blood chemistry: Secondary | ICD-10-CM | POA: Insufficient documentation

## 2017-08-26 DIAGNOSIS — R0602 Shortness of breath: Secondary | ICD-10-CM | POA: Insufficient documentation

## 2017-08-26 NOTE — Assessment & Plan Note (Addendum)
History is very concerning for coronary ischemia With her COPD and continued smoking ---respiratory etiology is possible The AM symptoms today could be GI--- but really more concerning for ischemia  EKG shows sinus at 92. Normal axis and intervals Non diagnostic ST depressions with no change since 08/06/17  I will contact Dr Rockey Situ to see her sooner She probably cannot exercise on a treadmill, so would need imaging or go right to a cath Discussed calling 911 with her--if she has any recurrent persistent symptoms  Has stopped smoking now!!

## 2017-08-26 NOTE — Telephone Encounter (Signed)
Upon phoning the patient this morning about her lab results, patient states that she woke up this morning with her chest hurting and nauseated.  The chest pain has went away now.  Patient has Cardiology appointment on September 24, 2017.  Please advise.

## 2017-08-26 NOTE — Assessment & Plan Note (Signed)
Still on replacement.  TSH recently wnl.  Compliant.  No ADE on med.

## 2017-08-26 NOTE — Assessment & Plan Note (Signed)
She was likely improved from previous COPD exacerbation but she is still having exertional shortness of breath that may be coming from a different cause.  Continue baseline inhalers.  See below.

## 2017-08-26 NOTE — Assessment & Plan Note (Signed)
Cr up.  D/w pt.  Very rare nsaids.  Recheck pending.  D/w pt.

## 2017-08-26 NOTE — Assessment & Plan Note (Addendum)
EKG is unremarkable.  I want cardiology input in case it is not just an isolated pulmonary issue.  Discussed with patient.  She agrees.  Continue beta-blocker for now.

## 2017-08-26 NOTE — Telephone Encounter (Signed)
If no sx at all now, then needs recheck here or UC today.  If any sx, then ER.   Thanks.

## 2017-08-26 NOTE — Assessment & Plan Note (Signed)
Flu shot to be done this fall.  Tetanus 2013 PNA 2011 Shingles d/w pt. out of stock.   No pap due to hysterectomy. Mammogram up to date.  DXA 2018 Colon cancer screening. +FH. Colonoscopy done prev.   Living will d/w pt. Foster son Gery Pray designated if patient were incapacitated.  Diet and exercise d/w pt- encouraged.   Smoking cessation d/w pt.  She is trying to limit smoking.  HCV and HIV neg prev.
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
## 2017-08-26 NOTE — Patient Instructions (Signed)
I am trying to get your cardiology appointment pushed up sooner. If you have any unexplained shortness of breath, or any chest pain, please call 911.

## 2017-08-26 NOTE — Telephone Encounter (Signed)
Left detailed message on voicemail and will continue to try to call.

## 2017-08-26 NOTE — Telephone Encounter (Signed)
Spoke with patient, scheduled with Dr. Silvio Pate at 4:15 pm 08/26/17.

## 2017-08-26 NOTE — Assessment & Plan Note (Signed)
Living will d/w pt. Foster son Gery Pray designated if patient were incapacitated.
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
## 2017-08-30 NOTE — Progress Notes (Signed)
Cardiology Office Note  Date:  08/31/2017   ID:  April, [name] 02/12/1955, MRN [id number]  PCP:  [name], MD   Chief Complaint  Patient presents with  . New Patient (Initial Visit)    SOB dizziness/light headed . Medications verbally reviewed.     HPI:  April [name] is a 63 yo woman with PMH of  COPD, smoker Chronic SOB Polymyalgia rheumatica HTN Remote cardiac cath 03/2004 Who presents by referral from Dr. Silvio Pate for consultation of her chest pain and shortness of breath  She reports that she is "Always SOB" "recently with different SOB", other than her usual symptoms Difficulty getting around walking without symptoms Shortness of breath bringing out her trash can Using more inhaler  Periodic lightheadedness Orthostatics negative today  Sometimes with tightness in her chest, one episode presenting at rest sternal area, resolved without intervention Thought it was indigestion  better after 3 hours Ate 1/2 piece toas, had some nausea  "No energy" Sometimes poor sleep  Still smoking, way down from what she used to do  Lab work reviewed CR elevated, dehydrated GFR lower  EKG personally reviewed by myself on todays visit Shows normal sinus rhythm rate 87 bpm no significant ST or T-wave changes  PMH:   has a past medical history of Anxiety, Anxiety and depression, Arthritis, Asthma, AVN (avascular necrosis of bone) ([place]), Bronchitis, COPD (chronic obstructive pulmonary disease) ([place]), Depression, Endometriosis, FH: CAD (coronary artery disease), History of palpitations, Hypothyroid, Osteopenia, PMR (polymyalgia rheumatica) ([place]), Smoker, SOB (shortness of breath) on exertion, Squamous cell carcinoma, and Temporal arteritis ([place]).  PSH:    Past Surgical History:  Procedure Laterality Date  . ABDOMINAL ADHESION SURGERY    . ABDOMINAL HYSTERECTOMY    . BREAST SURGERY Left    benign bx 1990  . CARDIAC CATHETERIZATION  2007   no PCI  .  CHOLECYSTECTOMY    . COLONOSCOPY W/ POLYPECTOMY    . INCONTINENCE SURGERY     2007   . JOINT REPLACEMENT Left 2012   left hip  . TONSILLECTOMY    . TOTAL HIP ARTHROPLASTY     L hip 2012  . TOTAL HIP ARTHROPLASTY Right 10/04/2012   Procedure: TOTAL HIP ARTHROPLASTY;  Surgeon: Garald Balding, MD;  Location: [facility];  Service: Orthopedics;  Laterality: Right;  . WRIST SURGERY     2012/ left wrist/ bone removed due to necrosis    Current Outpatient Medications  Medication Sig Dispense Refill  . acetaminophen (TYLENOL) 325 MG tablet Take 2 tablets (650 mg total) by mouth every 6 (six) hours as needed.    Marland Kitchen albuterol (VENTOLIN HFA) 108 (90 Base) MCG/ACT inhaler INHALE 2 PUFFS BY MOUTH EVERY 6 HOURS AS NEEDED FOR WHEEZING OR SHORTNESS OF BREATH. 18 g prn  . ALPRAZolam (XANAX) 0.5 MG tablet Take 0.5-1 tablets (0.25-0.5 mg total) by mouth 2 (two) times daily as needed for anxiety. 30 tablet 0  . aspirin EC 81 MG tablet Take 81 mg by mouth every other day.     Marland Kitchen Bioflavonoid Products (VITAMIN C) CHEW Chew by mouth 2 (two) times daily.     Marland Kitchen buPROPion (WELLBUTRIN XL) 300 MG 24 hr tablet Take 1 tablet (300 mg total) by mouth daily. 90 tablet 3  . Calcium 500 MG CHEW Chew by mouth 2 (two) times daily.    . Cholecalciferol (EQL VITAMIN D3) 1000 UNITS tablet Take 3 tablets (3,000 Units total) by mouth daily.    . Cyanocobalamin (  VITAMIN B12 PO) Take by mouth 2 (two) times daily. Takes 1 tablet every 3 days    . Fluticasone-Salmeterol (ADVAIR DISKUS) 250-50 MCG/DOSE AEPB INHALE 1 PUFF BY MOUTH 2 TIMES DAILY. 60 each prn  . Ivermectin (SOOLANTRA) 1 % CREA Apply topically daily.    Marland Kitchen levothyroxine (SYNTHROID, LEVOTHROID) 125 MCG tablet Take 1 tablet (125 mcg total) by mouth daily. Except for 1.5 tabs on Sundays 100 tablet 3  . meclizine (ANTIVERT) 12.5 MG tablet Take 1 tablet (12.5 mg total) by mouth 3 (three) times daily as needed for dizziness. 30 tablet 1  . methocarbamol (ROBAXIN) 500 MG tablet Take  1 tablet (500 mg total) by mouth 2 (two) times daily as needed for muscle spasms. 60 tablet 3  . metoprolol succinate (TOPROL-XL) 25 MG 24 hr tablet Take 1 tablet (25 mg total) by mouth daily. 90 tablet 3  . Multiple Vitamin (MULTIVITAMIN WITH MINERALS) TABS tablet Take 1 tablet by mouth every other day.     Marland Kitchen OVER THE COUNTER MEDICATION     . Tiotropium Bromide Monohydrate (SPIRIVA RESPIMAT) 2.5 MCG/ACT AERS Inhale 2 puffs into the lungs daily. 4 g 12   No current facility-administered medications for this visit.     Allergies:   Sulfonamide derivatives; Alendronate sodium; and Dilaudid [hydromorphone hcl]   Social History:  The patient  reports that she has been smoking cigarettes. She has a 11.22 pack-year smoking history. She has never used smokeless tobacco. She reports that she drinks alcohol. She reports that she does not use drugs.   Family History:   family history includes Alzheimer's disease in her mother; Arthritis in her brother; Breast cancer in her maternal aunt; Colon cancer in her mother; Colon polyps in her brother; Healthy in her son; Heart disease in her mother; Hypertension in her mother; Kidney failure in her mother; Suicidality in her brother.    Review of Systems: Review of Systems  Constitutional: Negative.   Respiratory: Positive for shortness of breath.   Cardiovascular: Positive for chest pain.  Gastrointestinal: Negative.   Musculoskeletal: Negative.   Neurological: Positive for dizziness.  Psychiatric/Behavioral: Negative.   All other systems reviewed and are negative.   PHYSICAL EXAM: VS:  BP 132/80 (BP Location: Right Arm, Patient Position: Sitting, Cuff Size: Normal)   Pulse 87   Ht 5\' 5"  (1.651 m)   Wt 165 lb (74.8 kg)   SpO2 98%   BMI 27.46 kg/m  , BMI Body mass index is 27.46 kg/m. GEN: Well nourished, well developed, in no acute distress  HEENT: normal  Neck: no JVD, carotid bruits, or masses Cardiac: RRR; no murmurs, rubs, or gallops,no  edema  Respiratory:  clear to auscultation bilaterally, normal work of breathing GI: soft, nontender, nondistended, + BS April: no deformity or atrophy  Skin: warm and dry, no rash Neuro:  Strength and sensation are intact Psych: euthymic mood, full affect  Recent Labs: 06/28/2017: Hemoglobin 13.9; Platelets 219.0; TSH 3.34 08/19/2017: ALT 13 08/24/2017: BUN 15; Creatinine, Ser 1.29; Potassium 3.7; Sodium 141    Lipid Panel Lab Results  Component Value Date   CHOL 202 (H) 08/19/2017   HDL 56.70 08/19/2017   LDLCALC 133 (H) 08/19/2017   TRIG 62.0 08/19/2017    Wt Readings from Last 3 Encounters:  08/31/17 165 lb (74.8 kg)  08/26/17 166 lb 8 oz (75.5 kg)  08/24/17 166 lb (75.3 kg)     ASSESSMENT AND PLAN:  COPD exacerbation (HCC) Continues to smoke Chronic shortness of  breath on exertion Also very deconditioned, likely contributing to her symptoms  Chest pain, unspecified type - Plan: EKG 12-Lead, ECHOCARDIOGRAM COMPLETE Somewhat atypical in nature but she does have risk factors for coronary disease We have ordered echocardiogram and stress testing  SOB (shortness of breath) - Plan: EKG 12-Lead, ECHOCARDIOGRAM COMPLETE Very deconditioned Fibromyalgia, COPD, continued smoking Echocardiogram and stress test ordered to evaluate ejection fraction rule out pulmonary hypertension and rule out underlying ischemia  Disposition:   F/U as needed   Total encounter time more than 60 minutes  Greater than 50% was spent in counseling and coordination of care with the patient  Patient seen in consultation for Dr. Damita Dunnings and will be referred back to his office from going care of issues detailed above  Orders Placed This Encounter  Procedures  . NM Myocar Multi W/Spect W/Wall Motion / EF  . EKG 12-Lead  . ECHOCARDIOGRAM COMPLETE     Signed, Esmond Plants, M.D., Ph.D. 08/31/2017  [place], [place]

## 2017-08-31 ENCOUNTER — Encounter: Payer: Self-pay | Admitting: Cardiovascular Disease

## 2017-08-31 ENCOUNTER — Ambulatory Visit: Payer: BC Managed Care – PPO | Admitting: Cardiovascular Disease

## 2017-08-31 VITALS — BP 132/80 | HR 87 | Ht 65.0 in | Wt 165.0 lb

## 2017-08-31 DIAGNOSIS — J441 Chronic obstructive pulmonary disease with (acute) exacerbation: Secondary | ICD-10-CM

## 2017-08-31 DIAGNOSIS — R0602 Shortness of breath: Secondary | ICD-10-CM

## 2017-08-31 DIAGNOSIS — R079 Chest pain, unspecified: Secondary | ICD-10-CM

## 2017-08-31 NOTE — Patient Instructions (Addendum)
Medication Instructions:   No medication changes made  Labwork:  No new labs needed  Testing/Procedures:  We will order an echo for shortness of breath and chest pain Your physician has requested that you have an echocardiogram. Echocardiography is a painless test that uses sound waves to create images of your heart. It provides your doctor with information about the size and shape of your heart and how well your heart's chambers and valves are working. This procedure takes approximately one hour. There are no restrictions for this procedure.    And a lexiscan myoview, SOB, chest pain, unable to treadmill [place]  Your caregiver has ordered a Stress Test with nuclear imaging. The purpose of this test is to evaluate the blood supply to your heart muscle. This procedure is referred to as a "Non-Invasive Stress Test." This is because other than having an IV started in your vein, nothing is inserted or "invades" your body. Cardiac stress tests are done to find areas of poor blood flow to the heart by determining the extent of coronary artery disease (CAD). Some patients exercise on a treadmill, which naturally increases the blood flow to your heart, while others who are  unable to walk on a treadmill due to physical limitations have a pharmacologic/chemical stress agent called Lexiscan . This medicine will mimic walking on a treadmill by temporarily increasing your coronary blood flow.   Please note: these test may take anywhere between 2-4 hours to complete  PLEASE REPORT TO [place] AT THE FIRST DESK WILL DIRECT YOU WHERE TO GO  Date of Procedure:_____________________________________  Arrival Time for Procedure:______________________________  Instructions regarding medication:   __XX__:  Hold betablocker(s) night before procedure and morning of procedure    PLEASE NOTIFY THE OFFICE AT LEAST 24 HOURS IN ADVANCE IF YOU ARE UNABLE TO KEEP YOUR  APPOINTMENT.  [phone] AND  PLEASE NOTIFY NUCLEAR MEDICINE AT [facility] AT LEAST 24 HOURS IN ADVANCE IF YOU ARE UNABLE TO KEEP YOUR APPOINTMENT. [phone]  How to prepare for your Myoview test:  1. Do not eat or drink after midnight 2. No caffeine for 24 hours prior to test 3. No smoking 24 hours prior to test. 4. Your medication may be taken with water.  If your doctor stopped a medication because of this test, do not take that medication. 5. Ladies, please do not wear dresses.  Skirts or pants are appropriate. Please wear a short sleeve shirt. 6. No perfume, cologne or lotion. 7. Wear comfortable walking shoes. No heels!    Follow-Up: It was a pleasure seeing you in the office today. Please call us if you have new issues that need to be addressed before your next appt.  [phone]  Your physician wants you to follow-up in:  As needed We will call you with the results of tests above  If you need a refill on your cardiac medications before your next appointment, please call your pharmacy.  For educational health videos Log in to : [email or web address] Or : [email or web address], password : [email or web address]

## 2017-09-01 ENCOUNTER — Ambulatory Visit (INDEPENDENT_AMBULATORY_CARE_PROVIDER_SITE_OTHER): Payer: BC Managed Care – PPO

## 2017-09-01 ENCOUNTER — Other Ambulatory Visit: Payer: Self-pay

## 2017-09-01 DIAGNOSIS — R0602 Shortness of breath: Secondary | ICD-10-CM | POA: Diagnosis not present

## 2017-09-01 DIAGNOSIS — R079 Chest pain, unspecified: Secondary | ICD-10-CM

## 2017-09-04 ENCOUNTER — Other Ambulatory Visit: Payer: Self-pay | Admitting: Family Medicine

## 2017-09-07 ENCOUNTER — Encounter
Admission: RE | Admit: 2017-09-07 | Discharge: 2017-09-07 | Disposition: A | Discharge status: Home / Self Care | Payer: BC Managed Care – PPO | Source: Ambulatory Visit | Attending: Cardiovascular Disease | Admitting: Cardiovascular Disease

## 2017-09-07 DIAGNOSIS — R079 Chest pain, unspecified: Secondary | ICD-10-CM

## 2017-09-07 DIAGNOSIS — R0602 Shortness of breath: Secondary | ICD-10-CM

## 2017-09-07 MED ORDER — REGADENOSON 0.4 MG/5ML IV SOLN
0.4000 mg | Freq: Once | INTRAVENOUS | Status: AC
  Administered 2017-09-07: 0.4 mg via INTRAVENOUS

## 2017-09-07 MED ORDER — TECHNETIUM TC 99M TETROFOSMIN IV KIT
31.6560 | PACK | Freq: Once | INTRAVENOUS | Status: AC | PRN
  Administered 2017-09-07: 31.656 via INTRAVENOUS

## 2017-09-07 MED ORDER — TECHNETIUM TC 99M TETROFOSMIN IV KIT
13.2600 | PACK | Freq: Once | INTRAVENOUS | Status: AC | PRN
  Administered 2017-09-07: 13.26 via INTRAVENOUS

## 2017-09-07 NOTE — Telephone Encounter (Signed)
Electronic request from pharmacy Note shows insurance prefers Proair and request new script be sent in if appropriate with dose, directions, quantity and refills.

## 2017-09-08 LAB — NM MYOCAR MULTI W/SPECT W/WALL MOTION / EF
CHL CUP NUCLEAR SRS: 3
CHL CUP NUCLEAR SSS: 2
CHL CUP RESTING HR STRESS: 96 {beats}/min
CSEPHR: 72 %
CSEPPHR: 113 {beats}/min
LV dias vol: 58 mL (ref 46–106)
LV sys vol: 24 mL
SDS: 0
TID: 0.85

## 2017-09-08 NOTE — Telephone Encounter (Signed)
Sent. Thanks.   

## 2017-09-09 ENCOUNTER — Telehealth: Payer: Self-pay | Admitting: Cardiovascular Disease

## 2017-09-09 ENCOUNTER — Other Ambulatory Visit: Payer: Self-pay | Admitting: *Deleted

## 2017-09-09 MED ORDER — ALBUTEROL SULFATE 108 (90 BASE) MCG/ACT IN AEPB: 2.0000 | INHALATION_SPRAY | Freq: Four times a day (QID) | RESPIRATORY_TRACT | 1 refills | Status: DC | PRN

## 2017-09-09 NOTE — Telephone Encounter (Signed)
I called and spoke with the patient. She is aware of her myoview results. She states that she is still waking up with some mild chest discomfort for about 30-45 minutes after she wakes up- discomfort is midsternal.  She will have some SOB with exertion. She does confirm that she has chronic bronchitis &COPD.  The last 2 mornings she has woken up with nausea.  I inquired if she has every had any issues with acid reflux. Per the patient, this has not been an issue for her in the past.  She does confirm she took some TUMS earlier "the other day," and her symptoms did seem to settle down after that.  I advised her I will forward this message to Dr. Rockey Situ to see if he may want her to try an OTC reflux medication or if he has any further recommendations. The patient is agreeable and is aware we will call her back after Dr. Rockey Situ reviews.

## 2017-09-09 NOTE — Telephone Encounter (Signed)
Pt c/o of Chest Pain: STAT if CP now or developed within 24 hours  1. Are you having CP right now?  No only 45 min after waking same as previous pain intermittent   2. Are you experiencing any other symptoms (ex. SOB, nausea, vomiting, sweating)? Tired no energy nauseated with episodes sob on exertion   3. How long have you been experiencing CP? For a while   4. Is your CP continuous or coming and going? Intermittent   5. Have you taken Nitroglycerin? No   Concerned this is recurring and hasn't heard anything back about stress test results  ?

## 2017-09-10 ENCOUNTER — Ambulatory Visit: Payer: BC Managed Care – PPO | Admitting: Internal Medicine

## 2017-09-12 NOTE — Telephone Encounter (Signed)
-  Needs to stop smoking -Check in with pulmonary doctor -Start omeprazole 20 mg daily, try before dinner Do not eat large meal before bed Start regular walking program, consider enrollment in pulmonary rehab at [facility] for COPD chronic bronchitis -Also try inhaler in the morning when she has her chest tightness

## 2017-09-13 NOTE — Telephone Encounter (Signed)
Contacted patient and she verbalized understanding of Dr Donivan Scull recommendations. She will get Omeprazole from over the counter and call her PCP, Dr Damita Dunnings, who manages her COPD if symptoms do not improve. She may reach out to the [facility] rehabilitation, number provided, for further options of exercise when ready.

## 2017-09-24 ENCOUNTER — Ambulatory Visit: Payer: BC Managed Care – PPO | Admitting: Cardiovascular Disease

## 2017-09-29 ENCOUNTER — Other Ambulatory Visit: Payer: Self-pay | Admitting: Family Medicine

## 2017-09-29 NOTE — Telephone Encounter (Signed)
Copied from [place] [id number]. Topic: General - Other >> Sep 29, 2017  1:32 PM [place], April [name] wrote: Patient is calling in stating she needs something for her anxiety what she is prescribed isnt helping. She doesn't have the funds for a copay to come in to be seen she states can someone call her or can a script be sent over for her.   Cb# [phone]

## 2017-09-29 NOTE — Telephone Encounter (Signed)
Last anxiety f/u 08/2017

## 2017-09-30 MED ORDER — ALPRAZOLAM 0.5 MG PO TABS: 0.2500 mg | ORAL_TABLET | Freq: Two times a day (BID) | ORAL | 0 refills | Status: DC | PRN

## 2017-09-30 NOTE — Telephone Encounter (Signed)
Sent. Thanks.  Have her update [place] as needed.

## 2017-09-30 NOTE — Telephone Encounter (Signed)
Please see how much wellbutrin she is taking, how much xanax she is using.  Please see what is doing on that is making the anxiety so much worse, get update on patient.  Let me know.  Thanks.

## 2017-09-30 NOTE — Telephone Encounter (Signed)
I would try taking an extra 1/2 to 1 tab of xanax if needed for anxiety.  If she needs a refill, I'll send it in.   Please verify that she is safe at home.   Thanks.

## 2017-09-30 NOTE — Telephone Encounter (Signed)
Patient is safe at home, door locks have been changed, etc.  Patient does need refill of Xanax and advised of instructions to take an extra 1/2 or 1 tab as needed for anxiety.

## 2017-09-30 NOTE — Telephone Encounter (Signed)
Patient is taking Wellbutrin 300 mg once daily.  Patient is taking Xanax 1 tablet daily and feels that it does help for a while but then gets anxious again.  Patient has not been taking the medication twice a day.  Patient says that the anxiety issues are concerning her husband who does not want to cooperate with anything concerning their separation/divorce.

## 2017-10-05 ENCOUNTER — Other Ambulatory Visit: Payer: BC Managed Care – PPO

## 2017-10-05 DIAGNOSIS — R6510 Systemic inflammatory response syndrome (SIRS) of non-infectious origin without acute organ dysfunction: Secondary | ICD-10-CM

## 2017-10-05 HISTORY — DX: Systemic inflammatory response syndrome (sirs) of non-infectious origin without acute organ dysfunction: R65.10

## 2017-10-12 ENCOUNTER — Encounter: Payer: BC Managed Care – PPO | Admitting: Family Medicine

## 2017-10-14 ENCOUNTER — Ambulatory Visit (INDEPENDENT_AMBULATORY_CARE_PROVIDER_SITE_OTHER): Payer: BC Managed Care – PPO

## 2017-10-14 DIAGNOSIS — Z23 Encounter for immunization: Secondary | ICD-10-CM

## 2017-10-27 ENCOUNTER — Other Ambulatory Visit: Payer: Self-pay

## 2017-10-27 ENCOUNTER — Ambulatory Visit: Payer: Self-pay

## 2017-10-27 ENCOUNTER — Observation Stay (HOSPITAL_COMMUNITY)
Admission: EM | Admit: 2017-10-27 | Discharge: 2017-10-30 | Disposition: A | Discharge status: Home / Self Care | Payer: BC Managed Care – PPO | Attending: Internal Medicine | Admitting: Internal Medicine

## 2017-10-27 ENCOUNTER — Encounter (HOSPITAL_COMMUNITY): Payer: Self-pay

## 2017-10-27 ENCOUNTER — Ambulatory Visit: Payer: BC Managed Care – PPO | Admitting: Family Medicine

## 2017-10-27 ENCOUNTER — Encounter: Payer: Self-pay | Admitting: Family Medicine

## 2017-10-27 VITALS — BP 134/80 | HR 139 | Temp 101.8°F | Ht 65.0 in | Wt 154.5 lb

## 2017-10-27 DIAGNOSIS — R110 Nausea: Secondary | ICD-10-CM

## 2017-10-27 DIAGNOSIS — Z96643 Presence of artificial hip joint, bilateral: Secondary | ICD-10-CM | POA: Insufficient documentation

## 2017-10-27 DIAGNOSIS — I2510 Atherosclerotic heart disease of native coronary artery without angina pectoris: Secondary | ICD-10-CM | POA: Diagnosis not present

## 2017-10-27 DIAGNOSIS — Z882 Allergy status to sulfonamides status: Secondary | ICD-10-CM | POA: Insufficient documentation

## 2017-10-27 DIAGNOSIS — N183 Chronic kidney disease, stage 3 unspecified: Secondary | ICD-10-CM | POA: Diagnosis present

## 2017-10-27 DIAGNOSIS — E559 Vitamin D deficiency, unspecified: Secondary | ICD-10-CM | POA: Insufficient documentation

## 2017-10-27 DIAGNOSIS — R519 Headache, unspecified: Secondary | ICD-10-CM | POA: Diagnosis present

## 2017-10-27 DIAGNOSIS — E876 Hypokalemia: Secondary | ICD-10-CM | POA: Diagnosis not present

## 2017-10-27 DIAGNOSIS — R51 Headache: Secondary | ICD-10-CM

## 2017-10-27 DIAGNOSIS — Z7982 Long term (current) use of aspirin: Secondary | ICD-10-CM | POA: Diagnosis not present

## 2017-10-27 DIAGNOSIS — F341 Dysthymic disorder: Secondary | ICD-10-CM | POA: Diagnosis present

## 2017-10-27 DIAGNOSIS — E871 Hypo-osmolality and hyponatremia: Secondary | ICD-10-CM | POA: Diagnosis present

## 2017-10-27 DIAGNOSIS — Z87891 Personal history of nicotine dependence: Secondary | ICD-10-CM | POA: Insufficient documentation

## 2017-10-27 DIAGNOSIS — J449 Chronic obstructive pulmonary disease, unspecified: Secondary | ICD-10-CM | POA: Diagnosis present

## 2017-10-27 DIAGNOSIS — R634 Abnormal weight loss: Secondary | ICD-10-CM

## 2017-10-27 DIAGNOSIS — M8580 Other specified disorders of bone density and structure, unspecified site: Secondary | ICD-10-CM | POA: Insufficient documentation

## 2017-10-27 DIAGNOSIS — R42 Dizziness and giddiness: Secondary | ICD-10-CM | POA: Insufficient documentation

## 2017-10-27 DIAGNOSIS — Z79899 Other long term (current) drug therapy: Secondary | ICD-10-CM | POA: Diagnosis not present

## 2017-10-27 DIAGNOSIS — I129 Hypertensive chronic kidney disease with stage 1 through stage 4 chronic kidney disease, or unspecified chronic kidney disease: Secondary | ICD-10-CM | POA: Diagnosis not present

## 2017-10-27 DIAGNOSIS — F419 Anxiety disorder, unspecified: Secondary | ICD-10-CM | POA: Diagnosis not present

## 2017-10-27 DIAGNOSIS — E039 Hypothyroidism, unspecified: Secondary | ICD-10-CM | POA: Diagnosis not present

## 2017-10-27 DIAGNOSIS — M315 Giant cell arteritis with polymyalgia rheumatica: Secondary | ICD-10-CM | POA: Diagnosis not present

## 2017-10-27 DIAGNOSIS — E860 Dehydration: Secondary | ICD-10-CM | POA: Diagnosis not present

## 2017-10-27 DIAGNOSIS — J181 Lobar pneumonia, unspecified organism: Secondary | ICD-10-CM | POA: Diagnosis not present

## 2017-10-27 DIAGNOSIS — J440 Chronic obstructive pulmonary disease with acute lower respiratory infection: Secondary | ICD-10-CM | POA: Diagnosis not present

## 2017-10-27 DIAGNOSIS — R000 Tachycardia, unspecified: Secondary | ICD-10-CM | POA: Diagnosis not present

## 2017-10-27 DIAGNOSIS — A419 Sepsis, unspecified organism: Secondary | ICD-10-CM

## 2017-10-27 DIAGNOSIS — I10 Essential (primary) hypertension: Secondary | ICD-10-CM | POA: Diagnosis present

## 2017-10-27 DIAGNOSIS — J209 Acute bronchitis, unspecified: Secondary | ICD-10-CM | POA: Diagnosis present

## 2017-10-27 DIAGNOSIS — Z885 Allergy status to narcotic agent status: Secondary | ICD-10-CM | POA: Insufficient documentation

## 2017-10-27 DIAGNOSIS — R6510 Systemic inflammatory response syndrome (SIRS) of non-infectious origin without acute organ dysfunction: Secondary | ICD-10-CM | POA: Diagnosis present

## 2017-10-27 DIAGNOSIS — F329 Major depressive disorder, single episode, unspecified: Secondary | ICD-10-CM | POA: Diagnosis not present

## 2017-10-27 DIAGNOSIS — R9431 Abnormal electrocardiogram [ECG] [EKG]: Secondary | ICD-10-CM | POA: Diagnosis present

## 2017-10-27 DIAGNOSIS — M5136 Other intervertebral disc degeneration, lumbar region: Secondary | ICD-10-CM | POA: Insufficient documentation

## 2017-10-27 DIAGNOSIS — F339 Major depressive disorder, recurrent, unspecified: Secondary | ICD-10-CM | POA: Diagnosis present

## 2017-10-27 DIAGNOSIS — R5381 Other malaise: Secondary | ICD-10-CM

## 2017-10-27 DIAGNOSIS — R0781 Pleurodynia: Secondary | ICD-10-CM | POA: Diagnosis present

## 2017-10-27 DIAGNOSIS — R509 Fever, unspecified: Secondary | ICD-10-CM

## 2017-10-27 HISTORY — DX: Systemic inflammatory response syndrome (sirs) of non-infectious origin without acute organ dysfunction: R65.10

## 2017-10-27 LAB — CBC
HEMATOCRIT: 40.2 % (ref 36.0–46.0)
HEMOGLOBIN: 13.3 g/dL (ref 12.0–15.0)
MCH: 30 pg (ref 26.0–34.0)
MCHC: 33.1 g/dL (ref 30.0–36.0)
MCV: 90.5 fL (ref 80.0–100.0)
Platelets: 188 10*3/uL (ref 150–400)
RBC: 4.44 MIL/uL (ref 3.87–5.11)
RDW: 14 % (ref 11.5–15.5)
WBC: 11.3 10*3/uL — ABNORMAL HIGH (ref 4.0–10.5)
nRBC: 0 % (ref 0.0–0.2)

## 2017-10-27 LAB — COMPREHENSIVE METABOLIC PANEL
ALBUMIN: 3.1 g/dL — AB (ref 3.5–5.0)
ALT: 50 U/L — ABNORMAL HIGH (ref 0–44)
AST: 59 U/L — AB (ref 15–41)
Alkaline Phosphatase: 98 U/L (ref 38–126)
Anion gap: 14 (ref 5–15)
BUN: 22 mg/dL (ref 8–23)
CHLORIDE: 95 mmol/L — AB (ref 98–111)
CO2: 20 mmol/L — AB (ref 22–32)
CREATININE: 1.37 mg/dL — AB (ref 0.44–1.00)
Calcium: 9.1 mg/dL (ref 8.9–10.3)
GFR calc Af Amer: 46 mL/min — ABNORMAL LOW (ref >60)
GFR calc non Af Amer: 40 mL/min — ABNORMAL LOW (ref >60)
Glucose, Bld: 118 mg/dL — ABNORMAL HIGH (ref 70–99)
POTASSIUM: 3.5 mmol/L (ref 3.5–5.1)
SODIUM: 129 mmol/L — AB (ref 135–145)
Total Bilirubin: 0.6 mg/dL (ref 0.3–1.2)
Total Protein: 7.3 g/dL (ref 6.5–8.1)

## 2017-10-27 LAB — URINALYSIS, ROUTINE W REFLEX MICROSCOPIC
Bilirubin Urine: NEGATIVE
GLUCOSE, UA: NEGATIVE mg/dL
KETONES UR: 20 mg/dL — AB
NITRITE: NEGATIVE
PROTEIN: 100 mg/dL — AB
Specific Gravity, Urine: 1.027 (ref 1.005–1.030)
pH: 5 (ref 5.0–8.0)

## 2017-10-27 LAB — POC INFLUENZA A&B (BINAX/QUICKVUE)
INFLUENZA B, POC: NEGATIVE
Influenza A, POC: NEGATIVE

## 2017-10-27 LAB — LIPASE, BLOOD: LIPASE: 20 U/L (ref 11–51)

## 2017-10-27 MED ORDER — ONDANSETRON HCL 4 MG/2ML IJ SOLN
4.0000 mg | Freq: Once | INTRAMUSCULAR | Status: AC
  Administered 2017-10-28: 4 mg via INTRAVENOUS
  Filled 2017-10-27: qty 2

## 2017-10-27 MED ORDER — SODIUM CHLORIDE 0.9 % IV SOLN
2.0000 g | Freq: Once | INTRAVENOUS | Status: AC
  Administered 2017-10-28: 2 g via INTRAVENOUS
  Filled 2017-10-27: qty 2

## 2017-10-27 MED ORDER — SODIUM CHLORIDE 0.9 % IV BOLUS (SEPSIS)
1000.0000 mL | Freq: Once | INTRAVENOUS | Status: AC
  Administered 2017-10-28: 1000 mL via INTRAVENOUS

## 2017-10-27 MED ORDER — ONDANSETRON 4 MG PO TBDP: 4.0000 mg | ORAL_TABLET | Freq: Once | ORAL | Status: DC | PRN

## 2017-10-27 MED ORDER — SODIUM CHLORIDE 0.9 % IV SOLN: 1.0000 g | INTRAVENOUS | Status: DC

## 2017-10-27 MED ORDER — METRONIDAZOLE IN NACL 5-0.79 MG/ML-% IV SOLN
500.0000 mg | Freq: Three times a day (TID) | INTRAVENOUS | Status: DC
  Administered 2017-10-28: 500 mg via INTRAVENOUS
  Filled 2017-10-27: qty 100

## 2017-10-27 MED ORDER — VANCOMYCIN HCL IN DEXTROSE 1-5 GM/200ML-% IV SOLN
1000.0000 mg | Freq: Once | INTRAVENOUS | Status: AC
  Administered 2017-10-28: 1000 mg via INTRAVENOUS
  Filled 2017-10-27: qty 200

## 2017-10-27 MED ORDER — SODIUM CHLORIDE 0.9 % IV BOLUS (SEPSIS)
250.0000 mL | Freq: Once | INTRAVENOUS | Status: AC
  Administered 2017-10-28: 250 mL via INTRAVENOUS

## 2017-10-27 NOTE — Telephone Encounter (Signed)
Pt has 15' appt 10/27/17 at 5 PM.

## 2017-10-27 NOTE — Assessment & Plan Note (Addendum)
Marked tachycardia despite regular metoprolol succinate 25mg  use. Anticipate dehydration related. EKG today without signs of afib. Recommended further evaluation at ER today. Pt initially hesitant but subsequently agrees. Friend will take her there tonight. We will call [facility] ER to notify pt on her way.  She denies dyspnea or chest pain/tightness.  Flu swab negative

## 2017-10-27 NOTE — Assessment & Plan Note (Signed)
Anticipate related to dehydration. rec ER eval, anticipate needs IVF.
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
## 2017-10-27 NOTE — ED Provider Notes (Signed)
[facility] EMERGENCY DEPARTMENT Provider Note   CSN: [id number] Arrival date & time: 10/27/17  1853     History   Chief Complaint Chief Complaint  Patient presents with  . Abdominal Pain    HPI April [name] is a 62 y.o. female.  Patient with past medical history of COPD, CAD, presents to the emergency department with a chief complaint of generalized body aches, fever, nausea, and vomiting.  She also reports epigastric abdominal pain.  Denies any lower abdominal pain.  She denies any dysuria, hematuria, vaginal discharge, bleeding.  She states that she has been unable to tolerate oral intake for the past several days.  Onset of her symptoms was 4 days ago, and she reports gradually worsening symptoms.  The history is provided by the patient. No language interpreter was used.    Past Medical History:  Diagnosis Date  . Anxiety   . Anxiety and depression    related to caring for mother during terminal illness  . Arthritis   . Asthma   . AVN (avascular necrosis of bone) (HCC)    hip and wrist  . Bronchitis    hx of  . COPD (chronic obstructive pulmonary disease) ([place])   . Depression   . Endometriosis   . FH: CAD (coronary artery disease)   . History of palpitations   . Hypothyroid   . Osteopenia    forteo through Dr. Estanislado Pandy (started 10/12)  . PMR (polymyalgia rheumatica) (HCC)   . Smoker   . SOB (shortness of breath) on exertion   . Squamous cell carcinoma    facial, 2016  . Temporal arteritis (HCC)    s/p prednisone taper    Patient Active Problem List   Diagnosis Date Noted  . Dizziness 10/27/2017  . Nonintractable episodic headache 10/27/2017  . Dehydration 10/27/2017  . Creatinine elevation 08/26/2017  . SOB (shortness of breath) 08/26/2017  . Chest pain 08/26/2017  . Tachycardia 06/29/2017  . COPD exacerbation ([place]) 06/23/2017  . Chest pain, pleuritic 06/23/2017  . HTN (hypertension) 06/23/2017  . Polymyalgia rheumatica ([place])  05/22/2016  . History of bilateral hip replacements 05/22/2016  . DDD (degenerative disc disease), lumbar 05/22/2016  . Benign paroxysmal positional vertigo 05/20/2016  . Colon cancer screening 08/20/2015  . Advance care planning 08/14/2014  . Vitamin D deficiency 08/14/2014  . COPD (chronic obstructive pulmonary disease) ([place]) 06/04/2014  . Avascular necrosis of bone of right hip ([place]) 10/06/2012  . Routine general medical examination at a health care facility 04/07/2011  . Osteopenia 06/09/2010  . AVN (avascular necrosis of bone) ([place]) 05/16/2010  . ASTHMA 03/23/2010  . Osteoarthritis 03/23/2010  . Hypothyroidism 03/21/2010  . ANXIETY DEPRESSION 03/21/2010  . SMOKER 03/21/2010  . SKIN LESION 03/21/2010  . TEMPORAL ARTERITIS 03/21/2010    Past Surgical History:  Procedure Laterality Date  . ABDOMINAL ADHESION SURGERY    . ABDOMINAL HYSTERECTOMY    . BREAST SURGERY Left    benign bx 1990  . CARDIAC CATHETERIZATION  2007   no PCI  . CHOLECYSTECTOMY    . COLONOSCOPY W/ POLYPECTOMY    . INCONTINENCE SURGERY     2007   . JOINT REPLACEMENT Left 2012   left hip  . TONSILLECTOMY    . TOTAL HIP ARTHROPLASTY     L hip 2012  . TOTAL HIP ARTHROPLASTY Right 10/04/2012   Procedure: TOTAL HIP ARTHROPLASTY;  Surgeon: Garald Balding, MD;  Location: [facility];  Service: Orthopedics;  Laterality: Right;  .  WRIST SURGERY     2012/ left wrist/ bone removed due to necrosis     OB History   None      Home Medications    Prior to Admission medications   Medication Sig Start Date End Date Taking? Authorizing Provider  acetaminophen (TYLENOL) 325 MG tablet Take 2 tablets (650 mg total) by mouth every 6 (six) hours as needed. 10/06/12   Cherylann Ratel, PA-C  Albuterol Sulfate (PROAIR RESPICLICK) 660 (90 Base) MCG/ACT AEPB Inhale 2 puffs into the lungs every 6 (six) hours as needed (for shortness of breath). 09/09/17   [name], MD  ALPRAZolam Duanne Moron) 0.5 MG tablet Take 0.5-1  tablets (0.25-0.5 mg total) by mouth 2 (two) times daily as needed for anxiety. 09/30/17   [name], MD  aspirin EC 81 MG tablet Take 81 mg by mouth every other day.     [provider]  Bioflavonoid Products (VITAMIN C) CHEW Chew by mouth 2 (two) times daily.     [provider]  buPROPion (WELLBUTRIN XL) 300 MG 24 hr tablet Take 1 tablet (300 mg total) by mouth daily. 08/24/17   [name], MD  Calcium 500 MG CHEW Chew by mouth 2 (two) times daily.    [provider]  Cholecalciferol (EQL VITAMIN D3) 1000 UNITS tablet Take 3 tablets (3,000 Units total) by mouth daily. 08/10/14   [name], MD  Cyanocobalamin (VITAMIN B12 PO) Take by mouth 2 (two) times daily. Takes 1 tablet every 3 days    [provider]  Fluticasone-Salmeterol (ADVAIR DISKUS) 250-50 MCG/DOSE AEPB INHALE 1 PUFF BY MOUTH 2 TIMES DAILY. 08/24/17   [name], MD  Ivermectin (SOOLANTRA) 1 % CREA Apply topically daily.    [provider]  levothyroxine (SYNTHROID, LEVOTHROID) 125 MCG tablet Take 1 tablet (125 mcg total) by mouth daily. Except for 1.5 tabs on Sundays 08/24/17   [name], MD  meclizine (ANTIVERT) 12.5 MG tablet Take 1 tablet (12.5 mg total) by mouth 3 (three) times daily as needed for dizziness. 06/23/17 06/23/18  Biagio Borg, MD  methocarbamol (ROBAXIN) 500 MG tablet Take 1 tablet (500 mg total) by mouth 2 (two) times daily as needed for muscle spasms. 08/24/17   [name], MD  metoprolol succinate (TOPROL-XL) 25 MG 24 hr tablet Take 1 tablet (25 mg total) by mouth daily. 07/16/17   [name], MD  Multiple Vitamin (MULTIVITAMIN WITH MINERALS) TABS tablet Take 1 tablet by mouth every other day.     [provider]  OVER THE COUNTER MEDICATION     [provider]  Tiotropium Bromide Monohydrate (SPIRIVA RESPIMAT) 2.5 MCG/ACT AERS Inhale 2 puffs into the lungs daily. 08/24/17   [name], MD    Family  History Family History  Problem Relation Age of Onset  . Heart disease Mother   . Hypertension Mother   . Alzheimer's disease Mother   . Colon cancer Mother   . Kidney failure Mother   . Arthritis Brother   . Suicidality Brother   . Colon polyps Brother   . Breast cancer Maternal Aunt   . Healthy Son     Social History Social History   Tobacco Use  . Smoking status: Former Smoker    Packs/day: 0.33    Years: 34.00    Pack years: 11.22    Types: Cigarettes    Last attempt to quit: 10/24/2017  . Smokeless tobacco: Never  Used  . Tobacco comment: trying to quit  Substance Use Topics  . Alcohol use: Yes    Alcohol/week: 0.0 standard drinks    Comment: occasional  . Drug use: Never     Allergies   Sulfonamide derivatives; Alendronate sodium; and Dilaudid [hydromorphone hcl]   Review of Systems Review of Systems  All other systems reviewed and are negative.    Physical Exam Updated Vital Signs BP (!) 117/56 (BP Location: Right Arm)   Pulse (!) 129   Temp 99.6 F (37.6 C) (Oral)   Resp 18   SpO2 95%   Physical Exam  Constitutional: She is oriented to person, place, and time. She appears well-developed and well-nourished.  HENT:  Head: Normocephalic and atraumatic.  Eyes: Pupils are equal, round, and reactive to light. Conjunctivae and EOM are normal.  Neck: Normal range of motion. Neck supple.  Cardiovascular: Regular rhythm. Exam reveals no gallop and no friction rub.  No murmur heard. tachycardic  Pulmonary/Chest: Effort normal and breath sounds normal. No respiratory distress. She has no wheezes. She has no rales. She exhibits no tenderness.  Abdominal: Soft. Bowel sounds are normal. She exhibits no distension and no mass. There is no tenderness. There is no rebound and no guarding.  Moderate epigastric abdominal tenderness  Musculoskeletal: Normal range of motion. She exhibits no edema or tenderness.  Neurological: She is alert and oriented to person,  place, and time.  Skin: Skin is warm and dry.  Psychiatric: She has a normal mood and affect. Her behavior is normal. Judgment and thought content normal.  Nursing note and vitals reviewed.    ED Treatments / Results  Labs (all labs ordered are listed, but only abnormal results are displayed) Labs Reviewed  COMPREHENSIVE METABOLIC PANEL - Abnormal; Notable for the following components:      Result Value   Sodium 129 (*)    Chloride 95 (*)    CO2 20 (*)    Glucose, Bld 118 (*)    Creatinine, Ser 1.37 (*)    Albumin 3.1 (*)    AST 59 (*)    ALT 50 (*)    GFR calc non Af Amer 40 (*)    GFR calc Af Amer 46 (*)    All other components within normal limits  CBC - Abnormal; Notable for the following components:   WBC 11.3 (*)    All other components within normal limits  URINALYSIS, ROUTINE W REFLEX MICROSCOPIC - Abnormal; Notable for the following components:   Color, Urine AMBER (*)    APPearance CLOUDY (*)    Hgb urine dipstick SMALL (*)    Ketones, ur 20 (*)    Protein, ur 100 (*)    Leukocytes, UA TRACE (*)    Bacteria, UA RARE (*)    All other components within normal limits  CULTURE, BLOOD (ROUTINE X 2)  CULTURE, BLOOD (ROUTINE X 2)  LIPASE, BLOOD  I-STAT CG4 LACTIC ACID, ED    EKG None  Radiology No results found.  Procedures Procedures (including critical care time) CRITICAL CARE Performed by: Montine Circle   Total critical care time: 36 minutes  Critical care time was exclusive of separately billable procedures and treating other patients.  Critical care was necessary to treat or prevent imminent or life-threatening deterioration.  Critical care was time spent personally by me on the following activities: development of treatment plan with patient and/or surrogate as well as nursing, discussions with consultants, evaluation of patient's response to treatment, examination  of patient, obtaining history from patient or surrogate, ordering and performing  treatments and interventions, ordering and review of laboratory studies, ordering and review of radiographic studies, pulse oximetry and re-evaluation of patient's condition.  Medications Ordered in ED Medications  ondansetron (ZOFRAN-ODT) disintegrating tablet 4 mg (has no administration in time range)  sodium chloride 0.9 % bolus 1,000 mL (1,000 mLs Intravenous New Bag/Given 10/28/17 0032)    And  sodium chloride 0.9 % bolus 1,000 mL (1,000 mLs Intravenous New Bag/Given 10/28/17 0023)    And  sodium chloride 0.9 % bolus 250 mL (250 mLs Intravenous New Bag/Given 10/28/17 0034)  metroNIDAZOLE (FLAGYL) IVPB 500 mg (500 mg Intravenous New Bag/Given 10/28/17 0032)  vancomycin (VANCOCIN) IVPB 1000 mg/200 mL premix (has no administration in time range)  ceFEPIme (MAXIPIME) 2 g in sodium chloride 0.9 % 100 mL IVPB (2 g Intravenous New Bag/Given 10/28/17 0024)  ondansetron (ZOFRAN) injection 4 mg (4 mg Intravenous Given 10/28/17 0046)     Initial Impression / Assessment and Plan / ED Course  I have reviewed the triage vital signs and the nursing notes.  Pertinent labs & imaging results that were available during my care of the patient were reviewed by me and considered in my medical decision making (see chart for details).     Patient with generalized body aches and dry heaves for the past 4 days.  In triage noted to have fever to 101.8.  Tachycardic to 139.  Unable to keep down fluids.  Urinalysis is concerning for possible infection.  She is a smoker and has known COPD.  Chest x-ray shows possible lower lobe infiltrate.  Patient meets sirs criteria with possible sources of either urine or pulmonary.  Will start weight-based fluids.  Sepsis protocol activated.  Appreciate Dr. Myna Hidalgo for admitting the patient to the hospital.  MDM Reviewed: nursing note and vitals Interpretation: labs and x-ray (Urinalysis shows 11-20 white blood cells, trace leukocytes, rare bacteria, x-ray shows possible  lower lobe infiltrate) Total time providing critical care: 30-74 minutes (36). This excludes time spent performing separately reportable procedures and services. Consults: admitting MD     Final Clinical Impressions(s) / ED Diagnoses   Final diagnoses:  Sepsis, due to unspecified organism, unspecified whether acute organ dysfunction present [facility])    ED Discharge Orders    None       Montine Circle, PA-C 10/28/17 0103    Ward, Delice Bison, DO 10/28/17 0207

## 2017-10-27 NOTE — Telephone Encounter (Signed)
Pt called with C/O dizziness for 4 days. States she was sick for several days. No appetite. She feels weak. She rates her dizziness at moderate stating that she can walk but has to holds on to things. Her BP yesterday was 143/90 HR 101 today BP 152/87 and HR 134. Pt states that she has recently had an echo cardiogram and stress test which was normal. Pt states she has been taking her medication. She has had a real bad headache and has taken Excedrin migraine yesterday which helped but today the headache is back. No other symptoms reported. Appointment scheduled per protocol.  Care advice read to patient. Pt verbalized understanding of all instructions. Reason for Disposition . [1] MODERATE dizziness (e.g., interferes with normal activities) AND [2] has NOT been evaluated by physician for this  (Exception: dizziness caused by heat exposure, sudden standing, or poor fluid intake)  Answer Assessment - Initial Assessment Questions 1. DESCRIPTION: "Describe your dizziness."     Light headed 2. LIGHTHEADED: "Do you feel lightheaded?" (e.g., somewhat faint, woozy, weak upon standing)     yes 3. VERTIGO: "Do you feel like either you or the room is spinning or tilting?" (i.e. vertigo)     no 4. SEVERITY: "How bad is it?"  "Do you feel like you are going to faint?" "Can you stand and walk?"   - MILD - walking normally   - MODERATE - interferes with normal activities (e.g., work, school)    - SEVERE - unable to stand, requires support to walk, feels like passing out now.      moderate 5. ONSET:  "When did the dizziness begin?"     4 days with Headache 6. AGGRAVATING FACTORS: "Does anything make it worse?" (e.g., standing, change in head position)    standing 7. HEART RATE: "Can you tell me your heart rate?" "How many beats in 15 seconds?"  (Note: not all patients can do this)       101 BP 143/90 8. CAUSE: "What do you think is causing the dizziness?"     unknown 9. RECURRENT SYMPTOM: "Have you had  dizziness before?" If so, ask: "When was the last time?" "What happened that time?"     Yes BP was elevated echo and stress test 10. OTHER SYMPTOMS: "Do you have any other symptoms?" (e.g., fever, chest pain, vomiting, diarrhea, bleeding)       headache 11. PREGNANCY: "Is there any chance you are pregnant?" "When was your last menstrual period?"       N/A  Protocols used: DIZZINESS [facility]

## 2017-10-27 NOTE — Assessment & Plan Note (Signed)
?  recurrent temporal arteritis in h/o TA and PMR 2009.

## 2017-10-27 NOTE — Patient Instructions (Signed)
I think you have dehydration - so recommend ER evaluation for IV fluids.  This is the quickest way to get you feeling better. I'll let Dr Damita Dunnings know as well.

## 2017-10-27 NOTE — ED Triage Notes (Signed)
Pt states that for the past four days she has been not feeling good, dry heaves, upper abdominal pain, low grade fevers

## 2017-10-28 ENCOUNTER — Emergency Department (HOSPITAL_COMMUNITY): Payer: BC Managed Care – PPO

## 2017-10-28 ENCOUNTER — Encounter (HOSPITAL_COMMUNITY): Payer: Self-pay | Admitting: Family Medicine

## 2017-10-28 DIAGNOSIS — I10 Essential (primary) hypertension: Secondary | ICD-10-CM | POA: Diagnosis not present

## 2017-10-28 DIAGNOSIS — J449 Chronic obstructive pulmonary disease, unspecified: Secondary | ICD-10-CM

## 2017-10-28 DIAGNOSIS — R6510 Systemic inflammatory response syndrome (SIRS) of non-infectious origin without acute organ dysfunction: Secondary | ICD-10-CM | POA: Diagnosis present

## 2017-10-28 DIAGNOSIS — N183 Chronic kidney disease, stage 3 unspecified: Secondary | ICD-10-CM | POA: Diagnosis present

## 2017-10-28 DIAGNOSIS — E039 Hypothyroidism, unspecified: Secondary | ICD-10-CM

## 2017-10-28 DIAGNOSIS — R9431 Abnormal electrocardiogram [ECG] [EKG]: Secondary | ICD-10-CM | POA: Diagnosis present

## 2017-10-28 DIAGNOSIS — E871 Hypo-osmolality and hyponatremia: Secondary | ICD-10-CM | POA: Diagnosis present

## 2017-10-28 LAB — SEDIMENTATION RATE: Sed Rate: 73 mm/hr — ABNORMAL HIGH (ref 0–22)

## 2017-10-28 LAB — BASIC METABOLIC PANEL
ANION GAP: 10 (ref 5–15)
BUN: 22 mg/dL (ref 8–23)
CHLORIDE: 103 mmol/L (ref 98–111)
CO2: 17 mmol/L — AB (ref 22–32)
Calcium: 7.8 mg/dL — ABNORMAL LOW (ref 8.9–10.3)
Creatinine, Ser: 1.37 mg/dL — ABNORMAL HIGH (ref 0.44–1.00)
GFR calc non Af Amer: 40 mL/min — ABNORMAL LOW (ref >60)
GFR, EST AFRICAN AMERICAN: 46 mL/min — AB (ref >60)
Glucose, Bld: 116 mg/dL — ABNORMAL HIGH (ref 70–99)
Potassium: 3 mmol/L — ABNORMAL LOW (ref 3.5–5.1)
SODIUM: 130 mmol/L — AB (ref 135–145)

## 2017-10-28 LAB — CBC WITH DIFFERENTIAL/PLATELET
Abs Immature Granulocytes: 0.06 10*3/uL (ref 0.00–0.07)
BASOS ABS: 0 10*3/uL (ref 0.0–0.1)
Basophils Relative: 0 %
Eosinophils Absolute: 0 10*3/uL (ref 0.0–0.5)
Eosinophils Relative: 0 %
HEMATOCRIT: 36.3 % (ref 36.0–46.0)
HEMOGLOBIN: 12 g/dL (ref 12.0–15.0)
IMMATURE GRANULOCYTES: 1 %
LYMPHS ABS: 0.5 10*3/uL — AB (ref 0.7–4.0)
LYMPHS PCT: 5 %
MCH: 30.3 pg (ref 26.0–34.0)
MCHC: 33.1 g/dL (ref 30.0–36.0)
MCV: 91.7 fL (ref 80.0–100.0)
Monocytes Absolute: 0.6 10*3/uL (ref 0.1–1.0)
Monocytes Relative: 6 %
NEUTROS ABS: 9.1 10*3/uL — AB (ref 1.7–7.7)
NEUTROS PCT: 88 %
Platelets: 145 10*3/uL — ABNORMAL LOW (ref 150–400)
RBC: 3.96 MIL/uL (ref 3.87–5.11)
RDW: 14.2 % (ref 11.5–15.5)
WBC: 10.3 10*3/uL (ref 4.0–10.5)
nRBC: 0 % (ref 0.0–0.2)

## 2017-10-28 LAB — STREP PNEUMONIAE URINARY ANTIGEN: Strep Pneumo Urinary Antigen: NEGATIVE

## 2017-10-28 LAB — HIV ANTIBODY (ROUTINE TESTING W REFLEX): HIV Screen 4th Generation wRfx: NONREACTIVE

## 2017-10-28 LAB — I-STAT CG4 LACTIC ACID, ED: Lactic Acid, Venous: 1.69 mmol/L (ref 0.5–1.9)

## 2017-10-28 LAB — TSH: TSH: 0.233 u[IU]/mL — ABNORMAL LOW (ref 0.350–4.500)

## 2017-10-28 MED ORDER — CALCIUM CARBONATE 1250 (500 CA) MG PO TABS
1.0000 | ORAL_TABLET | Freq: Two times a day (BID) | ORAL | Status: DC
  Administered 2017-10-28 (×2): 500 mg via ORAL
  Filled 2017-10-28 (×3): qty 1

## 2017-10-28 MED ORDER — METHYLPREDNISOLONE SODIUM SUCC 125 MG IJ SOLR
80.0000 mg | INTRAMUSCULAR | Status: DC
  Administered 2017-10-28 – 2017-10-29 (×2): 80 mg via INTRAVENOUS
  Filled 2017-10-28 (×2): qty 2

## 2017-10-28 MED ORDER — ACETAMINOPHEN 650 MG RE SUPP: 650.0000 mg | Freq: Four times a day (QID) | RECTAL | Status: DC | PRN

## 2017-10-28 MED ORDER — ALPRAZOLAM 0.25 MG PO TABS
0.2500 mg | ORAL_TABLET | Freq: Two times a day (BID) | ORAL | Status: DC | PRN
  Administered 2017-10-28 – 2017-10-30 (×4): 0.5 mg via ORAL
  Filled 2017-10-28 (×4): qty 2

## 2017-10-28 MED ORDER — FENTANYL CITRATE (PF) 100 MCG/2ML IJ SOLN
50.0000 ug | INTRAMUSCULAR | Status: DC | PRN
  Administered 2017-10-28 (×2): 50 ug via INTRAVENOUS
  Filled 2017-10-28 (×2): qty 2

## 2017-10-28 MED ORDER — SODIUM CHLORIDE 0.9% FLUSH
3.0000 mL | Freq: Two times a day (BID) | INTRAVENOUS | Status: DC
  Administered 2017-10-28 – 2017-10-30 (×5): 3 mL via INTRAVENOUS

## 2017-10-28 MED ORDER — BUPROPION HCL ER (XL) 150 MG PO TB24
300.0000 mg | ORAL_TABLET | Freq: Every day | ORAL | Status: DC
  Administered 2017-10-28 – 2017-10-30 (×3): 300 mg via ORAL
  Filled 2017-10-28 (×3): qty 2

## 2017-10-28 MED ORDER — METOPROLOL SUCCINATE ER 25 MG PO TB24
25.0000 mg | ORAL_TABLET | Freq: Every day | ORAL | Status: DC
  Administered 2017-10-28 – 2017-10-30 (×3): 25 mg via ORAL
  Filled 2017-10-28 (×3): qty 1

## 2017-10-28 MED ORDER — MECLIZINE HCL 25 MG PO TABS: 12.5000 mg | ORAL_TABLET | Freq: Three times a day (TID) | ORAL | Status: DC | PRN

## 2017-10-28 MED ORDER — ADULT MULTIVITAMIN W/MINERALS CH
1.0000 | ORAL_TABLET | ORAL | Status: DC
  Administered 2017-10-28 – 2017-10-30 (×2): 1 via ORAL
  Filled 2017-10-28 (×2): qty 1

## 2017-10-28 MED ORDER — UMECLIDINIUM BROMIDE 62.5 MCG/INH IN AEPB
1.0000 | INHALATION_SPRAY | Freq: Every day | RESPIRATORY_TRACT | Status: DC
  Administered 2017-10-28 – 2017-10-30 (×3): 1 via RESPIRATORY_TRACT
  Filled 2017-10-28: qty 7

## 2017-10-28 MED ORDER — DOXYCYCLINE HYCLATE 100 MG PO TABS
100.0000 mg | ORAL_TABLET | Freq: Two times a day (BID) | ORAL | Status: DC
  Administered 2017-10-28 – 2017-10-30 (×5): 100 mg via ORAL
  Filled 2017-10-28 (×5): qty 1

## 2017-10-28 MED ORDER — POTASSIUM CHLORIDE CRYS ER 20 MEQ PO TBCR
40.0000 meq | EXTENDED_RELEASE_TABLET | Freq: Once | ORAL | Status: AC
  Administered 2017-10-28: 40 meq via ORAL
  Filled 2017-10-28: qty 2

## 2017-10-28 MED ORDER — SENNOSIDES-DOCUSATE SODIUM 8.6-50 MG PO TABS: 1.0000 | ORAL_TABLET | Freq: Every evening | ORAL | Status: DC | PRN

## 2017-10-28 MED ORDER — SODIUM CHLORIDE 0.9 % IV SOLN
1.0000 g | INTRAVENOUS | Status: DC
  Administered 2017-10-28 – 2017-10-30 (×3): 1 g via INTRAVENOUS
  Filled 2017-10-28 (×3): qty 10

## 2017-10-28 MED ORDER — MOMETASONE FURO-FORMOTEROL FUM 200-5 MCG/ACT IN AERO
2.0000 | INHALATION_SPRAY | Freq: Two times a day (BID) | RESPIRATORY_TRACT | Status: DC
  Administered 2017-10-28 – 2017-10-30 (×5): 2 via RESPIRATORY_TRACT
  Filled 2017-10-28: qty 8.8

## 2017-10-28 MED ORDER — MAGNESIUM SULFATE IN D5W 1-5 GM/100ML-% IV SOLN
1.0000 g | Freq: Once | INTRAVENOUS | Status: AC
  Administered 2017-10-28: 1 g via INTRAVENOUS
  Filled 2017-10-28: qty 100

## 2017-10-28 MED ORDER — METHYLPREDNISOLONE SODIUM SUCC 125 MG IJ SOLR: 60.0000 mg | Freq: Three times a day (TID) | INTRAMUSCULAR | Status: DC

## 2017-10-28 MED ORDER — POTASSIUM CHLORIDE IN NACL 20-0.9 MEQ/L-% IV SOLN
INTRAVENOUS | Status: AC
  Administered 2017-10-28: 04:00:00 via INTRAVENOUS
  Filled 2017-10-28: qty 1000

## 2017-10-28 MED ORDER — POLYVINYL ALCOHOL 1.4 % OP SOLN: Freq: Every day | OPHTHALMIC | Status: DC | PRN

## 2017-10-28 MED ORDER — ALBUTEROL SULFATE (2.5 MG/3ML) 0.083% IN NEBU
3.0000 mL | INHALATION_SOLUTION | Freq: Four times a day (QID) | RESPIRATORY_TRACT | Status: DC | PRN
  Administered 2017-10-28: 3 mL via RESPIRATORY_TRACT
  Filled 2017-10-28: qty 3

## 2017-10-28 MED ORDER — ASPIRIN EC 81 MG PO TBEC
81.0000 mg | DELAYED_RELEASE_TABLET | ORAL | Status: DC
  Administered 2017-10-28 – 2017-10-30 (×2): 81 mg via ORAL
  Filled 2017-10-28 (×2): qty 1

## 2017-10-28 MED ORDER — LEVOTHYROXINE SODIUM 125 MCG PO TABS
125.0000 ug | ORAL_TABLET | Freq: Every day | ORAL | Status: DC
  Administered 2017-10-28 – 2017-10-30 (×3): 125 ug via ORAL
  Filled 2017-10-28 (×3): qty 1

## 2017-10-28 MED ORDER — HYDROCODONE-ACETAMINOPHEN 5-325 MG PO TABS
1.0000 | ORAL_TABLET | ORAL | Status: DC | PRN
  Administered 2017-10-28 (×3): 2 via ORAL
  Filled 2017-10-28 (×3): qty 2

## 2017-10-28 MED ORDER — HEPARIN SODIUM (PORCINE) 5000 UNIT/ML IJ SOLN
5000.0000 [IU] | Freq: Three times a day (TID) | INTRAMUSCULAR | Status: DC
  Administered 2017-10-28 – 2017-10-30 (×7): 5000 [IU] via SUBCUTANEOUS
  Filled 2017-10-28 (×7): qty 1

## 2017-10-28 MED ORDER — ACETAMINOPHEN 325 MG PO TABS
650.0000 mg | ORAL_TABLET | Freq: Four times a day (QID) | ORAL | Status: DC | PRN
  Administered 2017-10-28: 650 mg via ORAL
  Filled 2017-10-28: qty 2

## 2017-10-28 MED ORDER — GUAIFENESIN ER 600 MG PO TB12
600.0000 mg | ORAL_TABLET | Freq: Two times a day (BID) | ORAL | Status: DC
  Administered 2017-10-28 – 2017-10-30 (×5): 600 mg via ORAL
  Filled 2017-10-28 (×5): qty 1

## 2017-10-28 MED ORDER — METHOCARBAMOL 500 MG PO TABS: 500.0000 mg | ORAL_TABLET | Freq: Two times a day (BID) | ORAL | Status: DC | PRN

## 2017-10-28 NOTE — Progress Notes (Signed)
PROGRESS NOTE    April [name]  OIB:[id number] DOB: 07/21/1955 DOA: 10/27/2017 PCP: [name], MD   Brief Narrative: Patient is a 62 year old, white female with a history of GCA/PMR, hypothyroidism, asthma, Stage III CKD, hypertension and anxiety who was admitted last night due to complaints of debilitating weakness and fatigue, fever, chills, headache, diaphoresis, and nausea for four days. CXR was concerning for pneumonia in the left lower lung base, ESR was measured at 73-she was started on antibiotics and admitted to the hospitalist service. See below for details  Subjective: Denies to complain of pleuritic left-sided chest pain.  Continues to have cough-complains that she is still fatigued and malaised.  No shortness of breath.  Assessment & Plan: SIR's secondary to Community Acquired PNA: Although improved-still coughing, with left-sided pleuritic chest pain-still feels fatigued and is clearly not back at her usual baseline.  Plans are to continue with Rocephin and doxycycline and await culture data.  Influenza PCR is negative.  Bitemporal headaches: Has known history of giant cell arteritis-previously was on prednisone which she completed.  ESR is significantly elevated-which could be from pneumonia-however given history of biopsy-proven giant cell arteritis-have started patient on steroids.  Will discuss case with neurology over the phone.  COPD: Stable without any exacerbation-continue bronchodilators.  CKD III: Creatinine close to usual baseline-follow periodically.  Anxiety: Stable-Continue wellbutrin and PRN Xanax.   Hypertension: controlled-continue metoprolol  Hypokalemia: Replete and recheck  Hypomagnesemia: Replete and recheck  Hyponatremia: Orthostatic vital signs were positive-could have been mild hypovolemia-continue supportive care.  Looks euvolemic on exam.  If significant drop in sodium levels-we can contemplate further work-up.   Prolonged QT Interval:  QTC better this morning-attempt to keep potassium more than 4, magnesium more than 2.  Recheck electrolytes tomorrow morning.  Monitor on telemetry.  Avoid QTC prolonging agents.  DVT prophylaxis:Heparin Code Status: Full Family Communication:None Disposition Plan: After patient can move around comfortably, patient may be discharged with prednisone and remaining antibiotic regiment.   Consultants:  None  Procedures: None   Objective: Vitals:   10/28/17 0140 10/28/17 0208 10/28/17 0522 10/28/17 0824  BP:  113/66 116/65   Pulse: (!) 118 (!) 120 (!) 108 (!) 112  Resp: 20 18  18   Temp:  99.6 F (37.6 C) 97.9 F (36.6 C)   TempSrc:  Oral Oral   SpO2: 98% 95% 91% 94%    Intake/Output Summary (Last 24 hours) at 10/28/2017 1019 Last data filed at 10/28/2017 0400 Gross per 24 hour  Intake 939.79 ml  Output -  Net 939.79 ml   There were no vitals filed for this visit.  Examination:  General exam: Appears calm and comfortable  Respiratory system: Left basilar crackles but otherwise clear to auscultation. Respiratory effort normal. Cardiovascular system: S1 & S2 heard, RRR. No JVD, murmurs, rubs, gallops or clicks. No pedal edema. Gastrointestinal system: Abdomen is nondistended, soft and nontender. No organomegaly or masses felt. Normal bowel sounds heard. Genitourinary: Left CVA tenderness  Central nervous system: Alert and oriented. No focal neurological deficits. Extremities: Symmetric 5 x 5 power. Skin: No rashes, lesions or ulcers seen Psychiatry: Judgement and insight appear normal. Mood & affect appropriate.     Data Reviewed: I have personally reviewed following labs and imaging studies  CBC: Recent Labs  Lab 10/27/17 2049 10/28/17 0302  WBC 11.3* 10.3  NEUTROABS  --  9.1*  HGB 13.3 12.0  HCT 40.2 36.3  MCV 90.5 91.7  PLT 188 945*   Basic Metabolic  Panel: Recent Labs  Lab 10/27/17 2049 10/28/17 0302  NA 129* 130*  K 3.5 3.0*  CL 95* 103  CO2 20* 17*   GLUCOSE 118* 116*  BUN 22 22  CREATININE 1.37* 1.37*  CALCIUM 9.1 7.8*   GFR: Estimated Creatinine Clearance: 40.7 mL/min (A) (by C-G formula based on SCr of 1.37 mg/dL (H)). Liver Function Tests: Recent Labs  Lab 10/27/17 2049  AST 59*  ALT 50*  ALKPHOS 98  BILITOT 0.6  PROT 7.3  ALBUMIN 3.1*   Recent Labs  Lab 10/27/17 2049  LIPASE 20   No results for input(s): AMMONIA in the last 168 hours. Coagulation Profile: No results for input(s): INR, PROTIME in the last 168 hours. Cardiac Enzymes: No results for input(s): CKTOTAL, CKMB, CKMBINDEX, TROPONINI in the last 168 hours. BNP (last 3 results) No results for input(s): PROBNP in the last 8760 hours. HbA1C: No results for input(s): HGBA1C in the last 72 hours. CBG: No results for input(s): GLUCAP in the last 168 hours. Lipid Profile: No results for input(s): CHOL, HDL, LDLCALC, TRIG, CHOLHDL, LDLDIRECT in the last 72 hours. Thyroid Function Tests: Recent Labs    10/28/17 0302  TSH 0.233*   Anemia Panel: No results for input(s): VITAMINB12, FOLATE, FERRITIN, TIBC, IRON, RETICCTPCT in the last 72 hours. Urine analysis:    Component Value Date/Time   COLORURINE AMBER (A) 10/27/2017 2004   APPEARANCEUR CLOUDY (A) 10/27/2017 2004   LABSPEC 1.027 10/27/2017 2004   PHURINE 5.0 10/27/2017 2004   GLUCOSEU NEGATIVE 10/27/2017 2004   HGBUR SMALL (A) 10/27/2017 2004   BILIRUBINUR NEGATIVE 10/27/2017 2004   KETONESUR 20 (A) 10/27/2017 2004   PROTEINUR 100 (A) 10/27/2017 2004   UROBILINOGEN 1.0 09/29/2012 1247   NITRITE NEGATIVE 10/27/2017 2004   LEUKOCYTESUR TRACE (A) 10/27/2017 2004   Sepsis Labs: @LABRCNTIP (procalcitonin:4,lacticidven:4)  )No results found for this or any previous visit (from the past 240 hour(s)).       Radiology Studies: Dg Chest 2 View  Result Date: 10/28/2017 CLINICAL DATA:  Fever EXAM: CHEST - 2 VIEW COMPARISON:  08/23/2017 FINDINGS: There is hyperinflation of the lungs compatible  with COPD. Left basilar airspace opacity could reflect atelectasis or infiltrate. Right lung clear. No effusions or acute bony abnormality. IMPRESSION: COPD. Left lower lobe atelectasis or infiltrate/pneumonia. Electronically Signed   By: Rolm Baptise M.D.   On: 10/28/2017 00:22        Scheduled Meds: . aspirin EC  81 mg Oral QODAY  . buPROPion  300 mg Oral Daily  . calcium carbonate  1 tablet Oral BID  . doxycycline  100 mg Oral Q12H  . guaiFENesin  600 mg Oral BID  . heparin  5,000 Units Subcutaneous Q8H  . levothyroxine  125 mcg Oral QAC breakfast  . methylPREDNISolone (SOLU-MEDROL) injection  80 mg Intravenous Q24H  . metoprolol succinate  25 mg Oral Daily  . mometasone-formoterol  2 puff Inhalation BID  . multivitamin with minerals  1 tablet Oral QODAY  . sodium chloride flush  3 mL Intravenous Q12H  . umeclidinium bromide  1 puff Inhalation Daily   Continuous Infusions: . 0.9 % NaCl with KCl 20 mEq / L 90 mL/hr at 10/28/17 0347  . cefTRIAXone (ROCEPHIN)  IV 1 g (10/28/17 0957)     LOS: 0 days    Time spent: [place]  If 7PM-7AM, please contact night-coverage [email or web address] Password [email or web address] 10/28/2017, 10:19 AM

## 2017-10-28 NOTE — H&P (Signed)
History and Physical    April [name] EPP:[id number] DOB: Apr 21, 1955 DOA: 10/27/2017  PCP: [name], MD   Patient coming from: home   Chief Complaint: Headaches, malaise, fever, nausea, left-sided pleuritic pain   HPI: April [name] is a 62 y.o. female with medical history significant for COPD, anxiety, chronic kidney disease stage III, hypothyroidism, hypertension, and history of GCA/PMR, now presenting to the emergency department with several days of subjective fevers, malaise, nausea, loss of appetite, and left-sided pleuritic pain.  Patient reports that the symptoms developed approximately 4 days ago and have been worsening.  She denies dysuria, abdominal pain, or flank pain.  She denies any increase in her chronic mild dyspnea and cough.  She reports pain at the left lower lateral chest with cough or deep inspiration.  Denies neck pain or stiffness.  Reports intermittent headaches involving both temporal regions.  Denies vision changes.  ED Course: Upon arrival to the ED, patient is found to be febrile to 38.8 C, saturating low 90s on room air, tachycardic in the 130s, and with stable blood pressure.  EKG features sinus tachycardia with rate 134 and QTc interval 579 ms.  Chest x-ray is notable for COPD changes and left lower lobe atelectasis versus pneumonia.  Chemistry panel reveals a sodium of 129 and creatinine 1.37, up from 1.29 in August.  CBC is notable for a mild leukocytosis.  Lactic acid is reassuringly normal.  Blood cultures were collected, 2.25 L normal saline was given, and patient was treated with vancomycin, cefepime, and Flagyl in the ED.  She will be observed on the telemetry unit for ongoing evaluation and management.  Review of Systems:  All other systems reviewed and apart from HPI, are negative.  Past Medical History:  Diagnosis Date  . Anxiety   . Anxiety and depression    related to caring for mother during terminal illness  . Arthritis   . Asthma   .  AVN (avascular necrosis of bone) (HCC)    hip and wrist  . Bronchitis    hx of  . COPD (chronic obstructive pulmonary disease) ([place])   . Depression   . Endometriosis   . FH: CAD (coronary artery disease)   . History of palpitations   . Hypothyroid   . Osteopenia    forteo through Dr. Estanislado Pandy (started 10/12)  . PMR (polymyalgia rheumatica) (HCC)   . Smoker   . SOB (shortness of breath) on exertion   . Squamous cell carcinoma    facial, 2016  . Temporal arteritis (HCC)    s/p prednisone taper    Past Surgical History:  Procedure Laterality Date  . ABDOMINAL ADHESION SURGERY    . ABDOMINAL HYSTERECTOMY    . BREAST SURGERY Left    benign bx 1990  . CARDIAC CATHETERIZATION  2007   no PCI  . CHOLECYSTECTOMY    . COLONOSCOPY W/ POLYPECTOMY    . INCONTINENCE SURGERY     2007   . JOINT REPLACEMENT Left 2012   left hip  . TONSILLECTOMY    . TOTAL HIP ARTHROPLASTY     L hip 2012  . TOTAL HIP ARTHROPLASTY Right 10/04/2012   Procedure: TOTAL HIP ARTHROPLASTY;  Surgeon: Garald Balding, MD;  Location: [facility];  Service: Orthopedics;  Laterality: Right;  . WRIST SURGERY     2012/ left wrist/ bone removed due to necrosis     reports that she quit smoking 4 days ago. Her smoking use included cigarettes. She  has a 11.22 pack-year smoking history. She has never used smokeless tobacco. She reports that she drinks alcohol. She reports that she does not use drugs.  Allergies  Allergen Reactions  . Sulfonamide Derivatives     REACTION: As a child.  Throat closed, rash.  . Alendronate Sodium     GI upset  . Dilaudid [Hydromorphone Hcl] Nausea And Vomiting    Family History  Problem Relation Age of Onset  . Heart disease Mother   . Hypertension Mother   . Alzheimer's disease Mother   . Colon cancer Mother   . Kidney failure Mother   . Arthritis Brother   . Suicidality Brother   . Colon polyps Brother   . Breast cancer Maternal Aunt   . Healthy Son      Prior to Admission  medications   Medication Sig Start Date End Date Taking? Authorizing Provider  acetaminophen (TYLENOL) 325 MG tablet Take 2 tablets (650 mg total) by mouth every 6 (six) hours as needed. Patient taking differently: Take 650 mg by mouth every 6 (six) hours as needed for mild pain.  10/06/12  Yes Petrarca, Mike Craze, PA-C  Albuterol Sulfate (PROAIR RESPICLICK) 175 (90 Base) MCG/ACT AEPB Inhale 2 puffs into the lungs every 6 (six) hours as needed (for shortness of breath). 09/09/17  Yes [name], MD  ALPRAZolam Duanne Moron) 0.5 MG tablet Take 0.5-1 tablets (0.25-0.5 mg total) by mouth 2 (two) times daily as needed for anxiety. 09/30/17  Yes [name], MD  aspirin EC 81 MG tablet Take 81 mg by mouth every other day.    Yes [provider]  Bioflavonoid Products (VITAMIN C) CHEW Chew 1 tablet by mouth 2 (two) times daily.    Yes [provider]  buPROPion (WELLBUTRIN XL) 300 MG 24 hr tablet Take 1 tablet (300 mg total) by mouth daily. 08/24/17  Yes [name], MD  Calcium 500 MG CHEW Chew 1 tablet by mouth 2 (two) times daily.    Yes [provider]  Cholecalciferol (EQL VITAMIN D3) 1000 UNITS tablet Take 3 tablets (3,000 Units total) by mouth daily. 08/10/14  Yes [name], MD  Cyanocobalamin (VITAMIN B12 PO) Take 1 tablet by mouth 2 (two) times a week.    Yes [provider]  Fluticasone-Salmeterol (ADVAIR DISKUS) 250-50 MCG/DOSE AEPB INHALE 1 PUFF BY MOUTH 2 TIMES DAILY. Patient taking differently: Inhale 1 puff into the lungs 2 (two) times daily.  08/24/17  Yes [name], MD  levothyroxine (SYNTHROID, LEVOTHROID) 125 MCG tablet Take 1 tablet (125 mcg total) by mouth daily. Except for 1.5 tabs on Sundays 08/24/17  Yes [name], MD  meclizine (ANTIVERT) 12.5 MG tablet Take 1 tablet (12.5 mg total) by mouth 3 (three) times daily as needed for dizziness. 06/23/17 06/23/18 Yes Biagio Borg, MD  methocarbamol (ROBAXIN) 500 MG tablet Take 1 tablet  (500 mg total) by mouth 2 (two) times daily as needed for muscle spasms. 08/24/17  Yes [name], MD  metoprolol succinate (TOPROL-XL) 25 MG 24 hr tablet Take 1 tablet (25 mg total) by mouth daily. 07/16/17  Yes [name], MD  Multiple Vitamin (MULTIVITAMIN WITH MINERALS) TABS tablet Take 1 tablet by mouth every other day.    Yes [provider]  Polyethyl Glycol-Propyl Glycol (SYSTANE OP) Apply 1 drop to eye daily as needed (dryness).   Yes [provider]  Tiotropium Bromide Monohydrate (SPIRIVA RESPIMAT) 2.5 MCG/ACT AERS Inhale 2  puffs into the lungs daily. 08/24/17  Yes [name], MD  TURMERIC PO Take 1 tablet by mouth once a week.   Yes [provider]    Physical Exam: Vitals:   10/28/17 0040 10/28/17 0100 10/28/17 0130 10/28/17 0140  BP: 123/65 (!) 121/54    Pulse: (!) 125  (!) 117 (!) 118  Resp: _0 Temp:      TempSrc:      SpO2: 96% 99% 99% 98%    Constitutional: NAD, calm  Eyes: PERTLA, lids and conjunctivae normal ENMT: Mucous membranes are moist. Posterior pharynx clear of any exudate or lesions.   Neck: normal, supple, no masses, no thyromegaly Respiratory: clear to auscultation bilaterally, occasional expiratory wheeze. No accessory muscle use.  Cardiovascular: Rate ~120 and regular. No extremity edema.   Abdomen: No distension, no tenderness, soft. Bowel sounds normal.  Musculoskeletal: no clubbing / cyanosis. No joint deformity upper and lower extremities.   Skin: no significant rashes, lesions, ulcers. Warm, dry, well-perfused. Neurologic: CN 2-12 grossly intact. Sensation intact,. Strength 5/5 in all 4 limbs.  Psychiatric: Alert and oriented x 3. Calm, cooperative.    Labs on Admission: I have personally reviewed following labs and imaging studies  CBC: Recent Labs  Lab 10/27/17 2049  WBC 11.3*  HGB 13.3  HCT 40.2  MCV 90.5  PLT 885   Basic Metabolic Panel: Recent Labs  Lab 10/27/17 2049  NA 129*  K  3.5  CL 95*  CO2 20*  GLUCOSE 118*  BUN 22  CREATININE 1.37*  CALCIUM 9.1   GFR: Estimated Creatinine Clearance: 40.7 mL/min (A) (by C-G formula based on SCr of 1.37 mg/dL (H)). Liver Function Tests: Recent Labs  Lab 10/27/17 2049  AST 59*  ALT 50*  ALKPHOS 98  BILITOT 0.6  PROT 7.3  ALBUMIN 3.1*   Recent Labs  Lab 10/27/17 2049  LIPASE 20   No results for input(s): AMMONIA in the last 168 hours. Coagulation Profile: No results for input(s): INR, PROTIME in the last 168 hours. Cardiac Enzymes: No results for input(s): CKTOTAL, CKMB, CKMBINDEX, TROPONINI in the last 168 hours. BNP (last 3 results) No results for input(s): PROBNP in the last 8760 hours. HbA1C: No results for input(s): HGBA1C in the last 72 hours. CBG: No results for input(s): GLUCAP in the last 168 hours. Lipid Profile: No results for input(s): CHOL, HDL, LDLCALC, TRIG, CHOLHDL, LDLDIRECT in the last 72 hours. Thyroid Function Tests: No results for input(s): TSH, T4TOTAL, FREET4, T3FREE, THYROIDAB in the last 72 hours. Anemia Panel: No results for input(s): VITAMINB12, FOLATE, FERRITIN, TIBC, IRON, RETICCTPCT in the last 72 hours. Urine analysis:    Component Value Date/Time   COLORURINE AMBER (A) 10/27/2017 2004   APPEARANCEUR CLOUDY (A) 10/27/2017 2004   LABSPEC 1.027 10/27/2017 2004   PHURINE 5.0 10/27/2017 2004   GLUCOSEU NEGATIVE 10/27/2017 2004   HGBUR SMALL (A) 10/27/2017 2004   BILIRUBINUR NEGATIVE 10/27/2017 2004   KETONESUR 20 (A) 10/27/2017 2004   PROTEINUR 100 (A) 10/27/2017 2004   UROBILINOGEN 1.0 09/29/2012 1247   NITRITE NEGATIVE 10/27/2017 2004   LEUKOCYTESUR TRACE (A) 10/27/2017 2004   Sepsis Labs: _1 (procalcitonin:4,lacticidven:4) )No results found for this or any previous visit (from the past 240 hour(s)).   Radiological Exams on Admission: Dg Chest 2 View  Result Date: 10/28/2017 CLINICAL DATA:  Fever EXAM: CHEST - 2 VIEW COMPARISON:  08/23/2017 FINDINGS:  There is hyperinflation of the lungs compatible with COPD. Left basilar  airspace opacity could reflect atelectasis or infiltrate. Right lung clear. No effusions or acute bony abnormality. IMPRESSION: COPD. Left lower lobe atelectasis or infiltrate/pneumonia. Electronically Signed   By: Rolm Baptise M.D.   On: 10/28/2017 00:22    EKG: Independently reviewed. Sinus tachycardia (rate 134), QTc 579 ms.   Assessment/Plan   1. SIRS  - Presents with several days of fevers, nausea with dry heaves, generalized aches, headaches, malaise, and left-sided pleuritic pain  - She is found to have fever, tachycardia, mild leukocytosis, and normal lactate  - She denies urinary sxs, abd exam benign, no meningismus, no wounds  - CXR features an opacity in LLL that corresponds with her pleuritic pain, but she denies increased dyspnea or cough  - She has been having intermittent bitemporal HA and has hx of GCA  - Blood cultures collected in ED, 30 cc/kg NS given, and she was treated with broad-spectrum antibiotics  - LLL pneumonia possible, and also concern for possible recurrent GCA  - Check ESR, continue empiric abx with Rocephin and doxy, continue supportive care   2. COPD  - Stable  - Continue ICS/LABA, LAMA, and as-needed albuterol    3. CKD III  - SCr is 1.37 on admission, slightly up from priors in setting of nausea and loss of appetite with hypovolemia  - Continue IVF hydration, avoid nephrotoxins, renally-dose medications, repeat chem panel in am   4. Anxiety  - Continue Wellbutrin and Xanax    5. Hypertension  - BP at goal  - Continue Toprol as tolerated   6. Hyponatremia  - Serum sodium is 129 on admission, previously normal  - She is hypovolemic and was treated with 2.25 liters NS in ED  - Repeat chemistry panel    7. Prolonged QT interval  - QTc prolonged to 579 ms on admission EKG  - Continue cardiac monitoring, replace potassium to 4 and mag to 2, check TSH, avoid offending  medications, and repeat EKG in am     DVT prophylaxis: sq heparin  Code Status: Full  Family Communication: Discussed with patient  Consults called: None Admission status: observation     Vianne Bulls, MD [facility] Pager [phone]  If 7PM-7AM, please contact night-coverage [email or web address] Password [email or web address]  10/28/2017, 2:00 AM

## 2017-10-29 ENCOUNTER — Ambulatory Visit: Payer: BC Managed Care – PPO | Admitting: Family Medicine

## 2017-10-29 ENCOUNTER — Encounter (HOSPITAL_COMMUNITY): Payer: Self-pay | Admitting: General Practice

## 2017-10-29 ENCOUNTER — Other Ambulatory Visit: Payer: Self-pay

## 2017-10-29 ENCOUNTER — Observation Stay (HOSPITAL_COMMUNITY): Payer: BC Managed Care – PPO

## 2017-10-29 DIAGNOSIS — N183 Chronic kidney disease, stage 3 (moderate): Secondary | ICD-10-CM | POA: Diagnosis not present

## 2017-10-29 DIAGNOSIS — J449 Chronic obstructive pulmonary disease, unspecified: Secondary | ICD-10-CM | POA: Diagnosis not present

## 2017-10-29 DIAGNOSIS — R6510 Systemic inflammatory response syndrome (SIRS) of non-infectious origin without acute organ dysfunction: Secondary | ICD-10-CM | POA: Diagnosis not present

## 2017-10-29 LAB — BASIC METABOLIC PANEL
Anion gap: 11 (ref 5–15)
BUN: 21 mg/dL (ref 8–23)
CHLORIDE: 105 mmol/L (ref 98–111)
CO2: 20 mmol/L — AB (ref 22–32)
CREATININE: 1.28 mg/dL — AB (ref 0.44–1.00)
Calcium: 8.9 mg/dL (ref 8.9–10.3)
GFR calc non Af Amer: 43 mL/min — ABNORMAL LOW (ref >60)
GFR, EST AFRICAN AMERICAN: 50 mL/min — AB (ref >60)
Glucose, Bld: 151 mg/dL — ABNORMAL HIGH (ref 70–99)
Potassium: 3.5 mmol/L (ref 3.5–5.1)
Sodium: 136 mmol/L (ref 135–145)

## 2017-10-29 LAB — CBC
HEMATOCRIT: 35 % — AB (ref 36.0–46.0)
HEMOGLOBIN: 11.2 g/dL — AB (ref 12.0–15.0)
MCH: 29.5 pg (ref 26.0–34.0)
MCHC: 32 g/dL (ref 30.0–36.0)
MCV: 92.1 fL (ref 80.0–100.0)
Platelets: 177 10*3/uL (ref 150–400)
RBC: 3.8 MIL/uL — ABNORMAL LOW (ref 3.87–5.11)
RDW: 14.7 % (ref 11.5–15.5)
WBC: 8.9 10*3/uL (ref 4.0–10.5)
nRBC: 0 % (ref 0.0–0.2)

## 2017-10-29 LAB — LEGIONELLA PNEUMOPHILA SEROGP 1 UR AG: L. pneumophila Serogp 1 Ur Ag: POSITIVE — AB

## 2017-10-29 LAB — MAGNESIUM: Magnesium: 2.4 mg/dL (ref 1.7–2.4)

## 2017-10-29 MED ORDER — PREDNISONE 50 MG PO TABS
60.0000 mg | ORAL_TABLET | Freq: Every day | ORAL | Status: DC
  Administered 2017-10-30: 60 mg via ORAL
  Filled 2017-10-29: qty 1

## 2017-10-29 MED ORDER — POLYETHYLENE GLYCOL 3350 17 G PO PACK
17.0000 g | PACK | Freq: Every day | ORAL | Status: DC
  Administered 2017-10-29 – 2017-10-30 (×2): 17 g via ORAL
  Filled 2017-10-29 (×2): qty 1

## 2017-10-29 MED ORDER — PREDNISONE 50 MG PO TABS: 60.0000 mg | ORAL_TABLET | Freq: Every day | ORAL | Status: DC

## 2017-10-29 MED ORDER — CALCIUM CARBONATE 1250 (500 CA) MG PO TABS
1.0000 | ORAL_TABLET | ORAL | Status: DC
  Administered 2017-10-29 (×2): 500 mg via ORAL
  Filled 2017-10-29: qty 1

## 2017-10-29 NOTE — Progress Notes (Addendum)
PROGRESS NOTE    April [name]  YBW:[id number] DOB: 04-08-1955 DOA: 10/27/2017 PCP: [name], MD   Brief Narrative: Patient is a 62 year old, white female with a history of GCA/PMR, hypothyroidism, asthma, Stage III CKD, hypertension and anxiety who was admitted last night due to complaints of debilitating weakness and fatigue, fever, chills, headache, diaphoresis, and nausea for four days. CXR was concerning for pneumonia in the left lower lung base, ESR was measured at 73-she was started on antibiotics and admitted to the hospitalist service. See below for details  Subjective: Left-sided pleuritic chest pain has resolved, headache has resolved-but she now has developed left flank pain-and appears somewhat uncomfortable.  Afebrile overnight-overall feels much better.  Assessment & Plan: SIR's secondary to Community Acquired PNA: Improved-afebrile overnight-no leukocytosis-she feels less fatigued and much better than yesterday-although she does not still feel she is back to her baseline.  Continue Rocephin and doxy.  Influenza PCR negative.  Blood culture negative.  Clinical improvement continues-and no significant etiology seen on CT of the abdomen (has been ordered to evaluate left flank pain) suspect can be transitioned to oral antimicrobial therapy and discharge.  Left flank pain: Occurred this morning-is moderately tender in the left flank area-obtaining CT scan of the abdomen-to make sure she does not have referred pain/empyema/intra-abdominal etiology.  Supportive care for now.  Bitemporal headaches: Headaches have resolved, patient has a known history of giant cell arteritis and previously was on prednisone.  Although headaches and elevated ESR could have been from fever/pneumonia-given history of biopsy-proven giant cell arteritis she has been empirically started on steroids.  I did discuss with neurology-Dr. Aroor over the phone on 10/24-he agrees-recommends that the patient  follow with her primary rheumatologist (Dr. Sabas Sous) for further care/decision to taper steroids.    COPD: Stable without any exacerbation-continue bronchodilators.  CKD III: Creatinine close to usual baseline-follow periodically.  Anxiety: Stable-Continue wellbutrin and PRN Xanax.   Hypertension: controlled-continue metoprolol  Hypokalemia: Repleted  Hypomagnesemia: Repleted  Hyponatremia: Resolved-orthostatic vital signs were positive on admission-hence could have hypovolemic hyponatremia that responded to IV fluids.  Follow periodically.   Prolonged QT Interval: QTC better-recheck QTC periodically-recheck potassium magnesium tomorrow morning.    Polymyalgia rheumatica: Less fatigue today after starting steroids.  Will get PT eval.  DVT prophylaxis:Heparin Code Status: Full Family Communication:None Disposition Plan: Hopefully home over the weekend depending on clinical improvement.  Consultants:  None  Procedures: None   Objective: Vitals:   10/28/17 2115 10/28/17 2240 10/29/17 0424 10/29/17 0906  BP: 113/71  130/86   Pulse: 85  77 82  Resp: 18  18 18   Temp: 97.9 F (36.6 C)  98.1 F (36.7 C)   TempSrc: Oral     SpO2: 95% 95% 98% 97%    Intake/Output Summary (Last 24 hours) at 10/29/2017 1316 Last data filed at 10/28/2017 1725 Gross per 24 hour  Intake 0 ml  Output -  Net 0 ml   There were no vitals filed for this visit.  Examination: General appearance:Awake, alert, not in any distress.  Eyes:no scleral icterus. HEENT: Atraumatic and Normocephalic Neck: supple, no JVD. Resp:Good air entry bilaterally,no rales or rhonchi CVS: S1 S2 regular, no murmurs.  GI: Bowel sounds present, moderately tender in the left mid flank area without any peritoneal signs.   Extremities: B/L Lower Ext shows no edema, both legs are warm to touch Neurology:  Non focal Psychiatric: Normal judgment and insight. Normal mood. Musculoskeletal:No digital cyanosis Skin:No  Rash, warm and dry  Wounds:N/A      Data Reviewed: I have personally reviewed following labs and imaging studies  CBC: Recent Labs  Lab 10/27/17 2049 10/28/17 0302 10/29/17 0424  WBC 11.3* 10.3 8.9  NEUTROABS  --  9.1*  --   HGB 13.3 12.0 11.2*  HCT 40.2 36.3 35.0*  MCV 90.5 91.7 92.1  PLT 188 145* 161   Basic Metabolic Panel: Recent Labs  Lab 10/27/17 2049 10/28/17 0302 10/29/17 0424  NA 129* 130* 136  K 3.5 3.0* 3.5  CL 95* 103 105  CO2 20* 17* 20*  GLUCOSE 118* 116* 151*  BUN 22 22 21   CREATININE 1.37* 1.37* 1.28*  CALCIUM 9.1 7.8* 8.9  MG  --   --  2.4   GFR: Estimated Creatinine Clearance: 43.6 mL/min (A) (by C-G formula based on SCr of 1.28 mg/dL (H)). Liver Function Tests: Recent Labs  Lab 10/27/17 2049  AST 59*  ALT 50*  ALKPHOS 98  BILITOT 0.6  PROT 7.3  ALBUMIN 3.1*   Recent Labs  Lab 10/27/17 2049  LIPASE 20   No results for input(s): AMMONIA in the last 168 hours. Coagulation Profile: No results for input(s): INR, PROTIME in the last 168 hours. Cardiac Enzymes: No results for input(s): CKTOTAL, CKMB, CKMBINDEX, TROPONINI in the last 168 hours. BNP (last 3 results) No results for input(s): PROBNP in the last 8760 hours. HbA1C: No results for input(s): HGBA1C in the last 72 hours. CBG: No results for input(s): GLUCAP in the last 168 hours. Lipid Profile: No results for input(s): CHOL, HDL, LDLCALC, TRIG, CHOLHDL, LDLDIRECT in the last 72 hours. Thyroid Function Tests: Recent Labs    10/28/17 0302  TSH 0.233*   Anemia Panel: No results for input(s): VITAMINB12, FOLATE, FERRITIN, TIBC, IRON, RETICCTPCT in the last 72 hours. Urine analysis:    Component Value Date/Time   COLORURINE AMBER (A) 10/27/2017 2004   APPEARANCEUR CLOUDY (A) 10/27/2017 2004   LABSPEC 1.027 10/27/2017 2004   PHURINE 5.0 10/27/2017 2004   GLUCOSEU NEGATIVE 10/27/2017 2004   HGBUR SMALL (A) 10/27/2017 2004   BILIRUBINUR NEGATIVE 10/27/2017 2004    KETONESUR 20 (A) 10/27/2017 2004   PROTEINUR 100 (A) 10/27/2017 2004   UROBILINOGEN 1.0 09/29/2012 1247   NITRITE NEGATIVE 10/27/2017 2004   LEUKOCYTESUR TRACE (A) 10/27/2017 2004   Sepsis Labs: @LABRCNTIP (procalcitonin:4,lacticidven:4)  ) Recent Results (from the past 240 hour(s))  Blood Culture (routine x 2)     Status: None (Preliminary result)   Collection Time: 10/27/17 11:58 PM  Result Value Ref Range Status   Specimen Description BLOOD RIGHT ARM  Final   Special Requests   Final    BOTTLES DRAWN AEROBIC AND ANAEROBIC Blood Culture adequate volume   Culture   Final    NO GROWTH 1 DAY Performed at [facility], [place] [address] [place], [place] [address]    Report Status PENDING  Incomplete  Blood Culture (routine x 2)     Status: None (Preliminary result)   Collection Time: 10/28/17 12:27 AM  Result Value Ref Range Status   Specimen Description BLOOD LEFT ANTECUBITAL  Final   Special Requests   Final    BOTTLES DRAWN AEROBIC AND ANAEROBIC Blood Culture adequate volume   Culture   Final    NO GROWTH 1 DAY Performed at [facility], [place] [address], [place], [place] [address]    Report Status PENDING  Incomplete         Radiology Studies: Dg Chest  2 View  Result Date: 10/28/2017 CLINICAL DATA:  Fever EXAM: CHEST - 2 VIEW COMPARISON:  08/23/2017 FINDINGS: There is hyperinflation of the lungs compatible with COPD. Left basilar airspace opacity could reflect atelectasis or infiltrate. Right lung clear. No effusions or acute bony abnormality. IMPRESSION: COPD. Left lower lobe atelectasis or infiltrate/pneumonia. Electronically Signed   By: Rolm Baptise M.D.   On: 10/28/2017 00:22        Scheduled Meds: . aspirin EC  81 mg Oral QODAY  . buPROPion  300 mg Oral Daily  . calcium carbonate  1 tablet Oral 2 times per day  . doxycycline  100 mg Oral Q12H  . guaiFENesin  600 mg Oral BID  . heparin  5,000 Units Subcutaneous Q8H  . levothyroxine  125 mcg Oral  QAC breakfast  . metoprolol succinate  25 mg Oral Daily  . mometasone-formoterol  2 puff Inhalation BID  . multivitamin with minerals  1 tablet Oral QODAY  . [START ON 10/30/2017] predniSONE  60 mg Oral Q breakfast  . sodium chloride flush  3 mL Intravenous Q12H  . umeclidinium bromide  1 puff Inhalation Daily   Continuous Infusions: . cefTRIAXone (ROCEPHIN)  IV 1 g (10/29/17 1019)     LOS: 0 days    Time spent: [place] MD  [facility]  If 7PM-7AM, please contact night-coverage [email or web address] Password [email or web address] 10/29/2017, 1:16 PM

## 2017-10-29 NOTE — Progress Notes (Signed)
First bottle of contrast was given.

## 2017-10-30 DIAGNOSIS — J181 Lobar pneumonia, unspecified organism: Secondary | ICD-10-CM | POA: Diagnosis not present

## 2017-10-30 DIAGNOSIS — J440 Chronic obstructive pulmonary disease with acute lower respiratory infection: Secondary | ICD-10-CM | POA: Diagnosis not present

## 2017-10-30 DIAGNOSIS — E876 Hypokalemia: Secondary | ICD-10-CM | POA: Diagnosis not present

## 2017-10-30 DIAGNOSIS — E871 Hypo-osmolality and hyponatremia: Secondary | ICD-10-CM | POA: Diagnosis not present

## 2017-10-30 MED ORDER — AZITHROMYCIN 500 MG PO TABS: 500.0000 mg | ORAL_TABLET | Freq: Every day | ORAL | 0 refills | Status: DC

## 2017-10-30 MED ORDER — PREDNISONE 20 MG PO TABS: 60.0000 mg | ORAL_TABLET | Freq: Every day | ORAL | 0 refills | Status: DC

## 2017-10-30 MED ORDER — CEFPODOXIME PROXETIL 200 MG PO TABS: 200.0000 mg | ORAL_TABLET | Freq: Two times a day (BID) | ORAL | 0 refills | Status: DC

## 2017-10-30 NOTE — Discharge Summary (Signed)
April [name] YYT:[id number] DOB: [birth year] DOA: 10/27/2017  PCP: [name], MD  Admit date: 10/27/2017  Discharge date: 10/30/2017  Admitted From: Home  Disposition:  Home   Recommendations for Outpatient Follow-up:   Follow up with PCP in 1-2 weeks  PCP Please obtain BMP/CBC, 2 view CXR in 1week,  (see Discharge instructions)   PCP Please follow up on the following pending results: Check a two-view chest x-ray in a week and then in 4 weeks.  Check CBC, BMP and magnesium level within a week.   Home Health: None   Equipment/Devices: None  Consultations: None Discharge Condition: Stable   CODE STATUS: Full   Diet Recommendation: Heart Healthy     Chief Complaint  Patient presents with  . Abdominal Pain     Brief history of present illness from the day of admission and additional interim summary    Patient is a 62 year old, white female with a history of GCA/PMR, hypothyroidism, asthma, Stage III CKD, hypertension and anxiety who was admitted last night due to complaints of debilitating weakness and fatigue, fever, chills, headache, diaphoresis, and nausea for four days. CXR was concerning for pneumonia in the left lower lung base, ESR was measured at 73-she was started on antibiotics and admitted to the hospitalist service.                                                                  Hospital Course    SIR's secondary to Community Acquired PNA:  Much improved, no symptoms this morning no oxygen demand, eager to go home will be placed on one more week of oral azithromycin along with Vantin and discharged home, request PCP to check a two-view chest x-ray along with CBC BMP and magnesium level in 1 week after discharge.  Bitemporal headaches: Has known history of giant cell arteritis-previously  was on prednisone which she completed.  ESR is significantly elevated-which could be from pneumonia-however given history of biopsy-proven giant cell arteritis-have started patient on steroids.  We will have her follow with her rheumatologist Dr. Eugenia Pancoast within a week of discharge, for now discharge on 60 mg of prednisone.  Headache is much improved no eye symptoms.  COPD: Stable without any exacerbation-continue bronchodilators.  CKD III: Creatinine close to usual baseline-follow periodically.  Anxiety: Stable-Continue wellbutrin and PRN Xanax.   Hypertension: controlled-continue metoprolol  Hypokalemia & Hypomagnesemia.  Both replaced and stable.   Prolonged QT Interval:  Due to electrolyte abnormalities resolved after electrolytes were replaced.   Discharge diagnosis     Principal Problem:   SIRS (systemic inflammatory response syndrome) (HCC) Active Problems:   Hypothyroidism   ANXIETY DEPRESSION   COPD (chronic obstructive pulmonary disease) (HCC)   Pleuritic pain   HTN (hypertension)   Headache   Hyponatremia  CKD (chronic kidney disease), stage III (HCC)   Prolonged QT interval    Discharge instructions    Discharge Instructions    Diet - low sodium heart healthy   Complete by:  As directed    Discharge instructions   Complete by:  As directed    Follow with Primary MD [name], MD in 3 days   Get CBC, CMP checked  by Primary MD  in 3 days   Activity: As tolerated with Full fall precautions use walker/cane & assistance as needed  Disposition Home    Diet: Heart Healthy    Special Instructions: If you have smoked or chewed Tobacco  in the last 2 yrs please stop smoking, stop any regular Alcohol  and or any Recreational drug use.  On your next visit with your primary care physician please Get Medicines reviewed and adjusted.  Please request your Prim.MD to go over all Hospital Tests and Procedure/Radiological results at the follow up,  please get all Hospital records sent to your Prim MD by signing hospital release before you go home.  If you experience worsening of your admission symptoms, develop shortness of breath, life threatening emergency, suicidal or homicidal thoughts you must seek medical attention immediately by calling 911 or calling your MD immediately  if symptoms less severe.  You Must read complete instructions/literature along with all the possible adverse reactions/side effects for all the Medicines you take and that have been prescribed to you. Take any new Medicines after you have completely understood and accpet all the possible adverse reactions/side effects.   Do not drive, operate heavy machinery, perform activities at heights, swimming or participation in water activities or provide baby sitting services if your were admitted for syncope or siezures until you have seen by Primary MD or a Neurologist and advised to do so again.  Do not drive when taking Pain medications.  Do not take more than prescribed Pain, Sleep and Anxiety Medications  Wear Seat belts while driving.   Increase activity slowly   Complete by:  As directed       Discharge Medications   Allergies as of 10/30/2017      Reactions   Sulfonamide Derivatives    REACTION: As a child.  Throat closed, rash.   Alendronate Sodium    GI upset   Dilaudid [hydromorphone Hcl] Nausea And Vomiting      Medication List    TAKE these medications   acetaminophen 325 MG tablet Commonly known as:  TYLENOL Take 2 tablets (650 mg total) by mouth every 6 (six) hours as needed. What changed:  reasons to take this   Albuterol Sulfate 108 (90 Base) MCG/ACT Aepb Inhale 2 puffs into the lungs every 6 (six) hours as needed (for shortness of breath).   ALPRAZolam 0.5 MG tablet Commonly known as:  XANAX Take 0.5-1 tablets (0.25-0.5 mg total) by mouth 2 (two) times daily as needed for anxiety.   aspirin EC 81 MG tablet Take 81 mg by mouth every  other day.   azithromycin 500 MG tablet Commonly known as:  ZITHROMAX Take 1 tablet (500 mg total) by mouth daily.   buPROPion 300 MG 24 hr tablet Commonly known as:  WELLBUTRIN XL Take 1 tablet (300 mg total) by mouth daily.   Calcium 500 MG Chew Chew 1 tablet by mouth 2 (two) times daily.   cefpodoxime 200 MG tablet Commonly known as:  VANTIN Take 1 tablet (200 mg total) by mouth 2 (two) times  daily.   Cholecalciferol 1000 units tablet Take 3 tablets (3,000 Units total) by mouth daily.   Fluticasone-Salmeterol 250-50 MCG/DOSE Aepb Commonly known as:  ADVAIR INHALE 1 PUFF BY MOUTH 2 TIMES DAILY. What changed:    how much to take  how to take this  when to take this  additional instructions   levothyroxine 125 MCG tablet Commonly known as:  SYNTHROID, LEVOTHROID Take 1 tablet (125 mcg total) by mouth daily. Except for 1.5 tabs on Sundays   meclizine 12.5 MG tablet Commonly known as:  ANTIVERT Take 1 tablet (12.5 mg total) by mouth 3 (three) times daily as needed for dizziness.   methocarbamol 500 MG tablet Commonly known as:  ROBAXIN Take 1 tablet (500 mg total) by mouth 2 (two) times daily as needed for muscle spasms.   metoprolol succinate 25 MG 24 hr tablet Commonly known as:  TOPROL-XL Take 1 tablet (25 mg total) by mouth daily.   multivitamin with minerals Tabs tablet Take 1 tablet by mouth every other day.   predniSONE 20 MG tablet Commonly known as:  DELTASONE Take 3 tablets (60 mg total) by mouth daily with breakfast.   SYSTANE OP Apply 1 drop to eye daily as needed (dryness).   Tiotropium Bromide Monohydrate 2.5 MCG/ACT Aers Inhale 2 puffs into the lungs daily.   TURMERIC PO Take 1 tablet by mouth once a week.   VITAMIN B12 PO Take 1 tablet by mouth 2 (two) times a week.   Vitamin C Chew Chew 1 tablet by mouth 2 (two) times daily.       Follow-up Information    [name], MD. Schedule an appointment as soon as possible for a  visit in 3 week(s).   Specialty:  Family Medicine Contact information: [place] [address] [phone]        Bo Merino, MD. Schedule an appointment as soon as possible for a visit in 1 week(s).   Specialty:  Rheumatology Contact information: [address] [place] [address] [phone]           Major procedures and Radiology Reports - PLEASE review detailed and final reports thoroughly  -        Ct Abdomen Pelvis Wo Contrast  Result Date: 10/29/2017 CLINICAL DATA:  62 year old female with a history of anterior rib pain EXAM: CT ABDOMEN AND PELVIS WITHOUT CONTRAST TECHNIQUE: Multidetector CT imaging of the abdomen and pelvis was performed following the standard protocol without IV contrast. COMPARISON:  None. FINDINGS: Lower chest: Mixed ground-glass and nodular opacity in the left lower lobe extending from the hilar region to the periphery with trace pleural fluid. Right lung relatively well aerated Hepatobiliary: Unremarkable liver.  Cholecystectomy. Pancreas: Unremarkable Spleen: Unremarkable Adrenals/Urinary Tract: Unremarkable appearance of the adrenal glands. No evidence of hydronephrosis of the right or left kidney. No nephrolithiasis. Unremarkable course of the bilateral ureters. Unremarkable appearance of the urinary bladder. Stomach/Bowel: Unremarkable stomach. Unremarkable small bowel. Normal appendix. Moderate stool burden. No focal inflammatory changes of the colon. Colonic diverticula without local inflammation. Vascular/Lymphatic: Calcifications of the abdominal aorta. No lymphadenopathy. Reproductive: Unremarkable pelvic anatomy. The pelvis is poorly visualized secondary to significant streak artifact from bilateral hip arthroplasty Other: None Musculoskeletal: Bilateral hip arthroplasty. No acute displaced fracture. Degenerative changes of the spine. IMPRESSION: Evidence of left lower lobe lobar pneumonia and small  parapneumonic effusion. Follow-up chest x-ray in 4-6 weeks is recommended to assure resolution. No acute CT finding of the abdomen/pelvis. Aortic Atherosclerosis (  ICD10-I70.0). Electronically Signed   By: Corrie Mckusick D.O.   On: 10/29/2017 15:53   Dg Chest 2 View  Result Date: 10/28/2017 CLINICAL DATA:  Fever EXAM: CHEST - 2 VIEW COMPARISON:  08/23/2017 FINDINGS: There is hyperinflation of the lungs compatible with COPD. Left basilar airspace opacity could reflect atelectasis or infiltrate. Right lung clear. No effusions or acute bony abnormality. IMPRESSION: COPD. Left lower lobe atelectasis or infiltrate/pneumonia. Electronically Signed   By: Rolm Baptise M.D.   On: 10/28/2017 00:22    Micro Results     Recent Results (from the past 240 hour(s))  Blood Culture (routine x 2)     Status: None (Preliminary result)   Collection Time: 10/27/17 11:58 PM  Result Value Ref Range Status   Specimen Description BLOOD RIGHT ARM  Final   Special Requests   Final    BOTTLES DRAWN AEROBIC AND ANAEROBIC Blood Culture adequate volume   Culture   Final    NO GROWTH 1 DAY Performed at [facility], [place] [address], [place], [place] [address]    Report Status PENDING  Incomplete  Blood Culture (routine x 2)     Status: None (Preliminary result)   Collection Time: 10/28/17 12:27 AM  Result Value Ref Range Status   Specimen Description BLOOD LEFT ANTECUBITAL  Final   Special Requests   Final    BOTTLES DRAWN AEROBIC AND ANAEROBIC Blood Culture adequate volume   Culture   Final    NO GROWTH 1 DAY Performed at [facility], [place] [address] [place], [place] [address]    Report Status PENDING  Incomplete    Today   Subjective    April [name] today has no headache,no chest abdominal pain,no new weakness tingling or numbness, feels much better wants to go home today.    Objective   Blood pressure 127/77, pulse 81, temperature 98 F (36.7 C), temperature source Oral, resp. rate 18,  SpO2 96 %.  No intake or output data in the 24 hours ending 10/30/17 1010  Exam Awake Alert, Oriented x 3, No new F.N deficits, Normal affect [place].AT,PERRAL Supple Neck,No JVD, No cervical lymphadenopathy appriciated.  Symmetrical Chest wall movement, Good air movement bilaterally, CTAB RRR,No Gallops,Rubs or new Murmurs, No Parasternal Heave +ve B.Sounds, Abd Soft, Non tender, No organomegaly appriciated, No rebound -guarding or rigidity. No Cyanosis, Clubbing or edema, No new Rash or bruise   Data Review   CBC w Diff:  Lab Results  Component Value Date   WBC 8.9 10/29/2017   HGB 11.2 (L) 10/29/2017   HCT 35.0 (L) 10/29/2017   PLT 177 10/29/2017   LYMPHOPCT 5 10/28/2017   MONOPCT 6 10/28/2017   EOSPCT 0 10/28/2017   BASOPCT 0 10/28/2017    CMP:  Lab Results  Component Value Date   NA 136 10/29/2017   K 3.5 10/29/2017   CL 105 10/29/2017   CO2 20 (L) 10/29/2017   BUN 21 10/29/2017   CREATININE 1.28 (H) 10/29/2017   CREATININE 1.13 (H) 06/03/2016   PROT 7.3 10/27/2017   ALBUMIN 3.1 (L) 10/27/2017   BILITOT 0.6 10/27/2017   ALKPHOS 98 10/27/2017   AST 59 (H) 10/27/2017   ALT 50 (H) 10/27/2017  .   Total Time in preparing paper work, data evaluation and todays exam - 56 minutes  Lala Lund M.D on 10/30/2017 at 10:10 AM  [facility]   Office  [phone]

## 2017-10-30 NOTE — Discharge Instructions (Signed)
Follow with Primary MD [name], MD in 3 days   Get CBC, CMP checked  by Primary MD  in 3 days   Activity: As tolerated with Full fall precautions use walker/cane & assistance as needed  Disposition Home    Diet: Heart Healthy    Special Instructions: If you have smoked or chewed Tobacco  in the last 2 yrs please stop smoking, stop any regular Alcohol  and or any Recreational drug use.  On your next visit with your primary care physician please Get Medicines reviewed and adjusted.  Please request your Prim.MD to go over all Hospital Tests and Procedure/Radiological results at the follow up, please get all Hospital records sent to your Prim MD by signing hospital release before you go home.  If you experience worsening of your admission symptoms, develop shortness of breath, life threatening emergency, suicidal or homicidal thoughts you must seek medical attention immediately by calling 911 or calling your MD immediately  if symptoms less severe.  You Must read complete instructions/literature along with all the possible adverse reactions/side effects for all the Medicines you take and that have been prescribed to you. Take any new Medicines after you have completely understood and accpet all the possible adverse reactions/side effects.   Do not drive, operate heavy machinery, perform activities at heights, swimming or participation in water activities or provide baby sitting services if your were admitted for syncope or siezures until you have seen by Primary MD or a Neurologist and advised to do so again.  Do not drive when taking Pain medications.  Do not take more than prescribed Pain, Sleep and Anxiety Medications  Wear Seat belts while driving.

## 2017-10-30 NOTE — Progress Notes (Signed)
April [name] to be D/C'd Home per MD order.  Discussed with the patient and all questions fully answered.  VSS, Skin clean, dry and intact without evidence of skin break down, no evidence of skin tears noted. IV catheter discontinued intact. Site without signs and symptoms of complications. Dressing and pressure applied.  An After Visit Summary was printed and given to the patient. Patient received prescription.  D/c education completed with patient/family including follow up instructions, medication list, d/c activities limitations if indicated, with other d/c instructions as indicated by MD - patient able to verbalize understanding, all questions fully answered.   Patient instructed to return to ED, call 911, or call MD for any changes in condition.   Patient escorted via [place], and D/C home via private auto.  Holley Raring 10/30/2017 1:23 PM

## 2017-11-01 ENCOUNTER — Telehealth: Payer: Self-pay

## 2017-11-01 MED ORDER — AMOXICILLIN-POT CLAVULANATE 875-125 MG PO TABS: 1.0000 | ORAL_TABLET | Freq: Two times a day (BID) | ORAL | 0 refills | Status: DC

## 2017-11-01 NOTE — Telephone Encounter (Signed)
Spoke with patient. Patient is scheduled to follow up on 11/05/2017 for hospital follow up. Patient states that she was prescribed Cefpodoxime 200 mg at the hospital and she states this is expensive and it is generic. Patient is taking Azithromycin and Prednisone. Can she be given something else equivalent to Cefpodoxime 200 mg. Patient called the hospital but they could not help her since she was discharged already and was advised to check with Dr Damita Dunnings. Pharmacy on file is correct. CB: [phone]

## 2017-11-01 NOTE — Telephone Encounter (Signed)
If she can't get the Cefpodoxime at all, then change to augmentin.   This should have reasonably similar coverage and my be cheaper.   rx sent.  Keep taking azithromycin.   Thanks.
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
## 2017-11-01 NOTE — Telephone Encounter (Signed)
Patient notified as instructed by telephone and verbalized understanding. 

## 2017-11-02 LAB — CULTURE, BLOOD (ROUTINE X 2)
CULTURE: NO GROWTH
Culture: NO GROWTH
Special Requests: ADEQUATE
Special Requests: ADEQUATE

## 2017-11-04 ENCOUNTER — Ambulatory Visit: Payer: BC Managed Care – PPO | Admitting: Physician Assistant

## 2017-11-04 ENCOUNTER — Encounter: Payer: Self-pay | Admitting: Physician Assistant

## 2017-11-04 VITALS — BP 130/81 | HR 88 | Resp 14 | Ht 66.0 in | Wt 161.4 lb

## 2017-11-04 DIAGNOSIS — Z96643 Presence of artificial hip joint, bilateral: Secondary | ICD-10-CM

## 2017-11-04 DIAGNOSIS — F17200 Nicotine dependence, unspecified, uncomplicated: Secondary | ICD-10-CM

## 2017-11-04 DIAGNOSIS — M8589 Other specified disorders of bone density and structure, multiple sites: Secondary | ICD-10-CM

## 2017-11-04 DIAGNOSIS — Z8659 Personal history of other mental and behavioral disorders: Secondary | ICD-10-CM

## 2017-11-04 DIAGNOSIS — Z8639 Personal history of other endocrine, nutritional and metabolic disease: Secondary | ICD-10-CM

## 2017-11-04 DIAGNOSIS — Z8709 Personal history of other diseases of the respiratory system: Secondary | ICD-10-CM

## 2017-11-04 DIAGNOSIS — M316 Other giant cell arteritis: Secondary | ICD-10-CM

## 2017-11-04 DIAGNOSIS — M5136 Other intervertebral disc degeneration, lumbar region: Secondary | ICD-10-CM

## 2017-11-04 DIAGNOSIS — M353 Polymyalgia rheumatica: Secondary | ICD-10-CM

## 2017-11-04 MED ORDER — PREDNISONE 10 MG PO TABS: ORAL_TABLET | ORAL | 0 refills | Status: DC

## 2017-11-04 NOTE — Patient Instructions (Signed)
50 mg by mouth x4 days 40 mg by mouth x4 days  30 mg by mouth x4 days 20 mg by mouth x4 days  10 mg by mouth x4 days  5 mg by mouth x4 days

## 2017-11-05 ENCOUNTER — Ambulatory Visit: Payer: BC Managed Care – PPO | Admitting: Family Medicine

## 2017-11-05 ENCOUNTER — Ambulatory Visit (INDEPENDENT_AMBULATORY_CARE_PROVIDER_SITE_OTHER)
Admission: RE | Admit: 2017-11-05 | Discharge: 2017-11-05 | Disposition: A | Discharge status: Home / Self Care | Payer: BC Managed Care – PPO | Source: Ambulatory Visit | Attending: Family Medicine | Admitting: Family Medicine

## 2017-11-05 ENCOUNTER — Encounter: Payer: Self-pay | Admitting: Family Medicine

## 2017-11-05 VITALS — BP 156/88 | HR 97 | Temp 98.0°F | Ht 66.0 in | Wt 160.5 lb

## 2017-11-05 DIAGNOSIS — R6510 Systemic inflammatory response syndrome (SIRS) of non-infectious origin without acute organ dysfunction: Secondary | ICD-10-CM

## 2017-11-05 DIAGNOSIS — M316 Other giant cell arteritis: Secondary | ICD-10-CM | POA: Diagnosis not present

## 2017-11-05 DIAGNOSIS — J189 Pneumonia, unspecified organism: Secondary | ICD-10-CM | POA: Diagnosis not present

## 2017-11-05 LAB — CBC WITH DIFFERENTIAL/PLATELET
Basophils Absolute: 0 10*3/uL (ref 0.0–0.1)
Basophils Relative: 0.1 % (ref 0.0–3.0)
EOS PCT: 0 % (ref 0.0–5.0)
Eosinophils Absolute: 0 10*3/uL (ref 0.0–0.7)
HCT: 39.3 % (ref 36.0–46.0)
HEMOGLOBIN: 13 g/dL (ref 12.0–15.0)
LYMPHS ABS: 0.9 10*3/uL (ref 0.7–4.0)
Lymphocytes Relative: 6.3 % — ABNORMAL LOW (ref 12.0–46.0)
MCHC: 33 g/dL (ref 30.0–36.0)
MCV: 93 fl (ref 78.0–100.0)
MONOS PCT: 1.8 % — AB (ref 3.0–12.0)
Monocytes Absolute: 0.3 10*3/uL (ref 0.1–1.0)
Neutro Abs: 13.5 10*3/uL — ABNORMAL HIGH (ref 1.4–7.7)
Platelets: 474 10*3/uL — ABNORMAL HIGH (ref 150.0–400.0)
RBC: 4.23 Mil/uL (ref 3.87–5.11)
RDW: 15.4 % (ref 11.5–15.5)
WBC: 14.7 10*3/uL — AB (ref 4.0–10.5)

## 2017-11-05 LAB — BASIC METABOLIC PANEL
BUN: 19 mg/dL (ref 6–23)
CALCIUM: 9.4 mg/dL (ref 8.4–10.5)
CO2: 28 mEq/L (ref 19–32)
Chloride: 98 mEq/L (ref 96–112)
Creatinine, Ser: 1.07 mg/dL (ref 0.40–1.20)
GFR: 54.76 mL/min — AB (ref >60.00)
Glucose, Bld: 149 mg/dL — ABNORMAL HIGH (ref 70–99)
POTASSIUM: 3.9 meq/L (ref 3.5–5.1)
SODIUM: 137 meq/L (ref 135–145)

## 2017-11-05 LAB — MAGNESIUM: Magnesium: 2.1 mg/dL (ref 1.5–2.5)

## 2017-11-05 NOTE — Patient Instructions (Signed)
Keep tapering the prednisone.  Finish the antibiotics.  Go to the lab on the way out.  We'll contact you with your xray and lab report. We'll go from there.  Take care.  Glad to see you.

## 2017-11-05 NOTE — Progress Notes (Signed)
She is working on staying off cigarettes.  D/w pt.  She hasn't smoked since the recent events.    She was admitted with pneumonia, SIRS, COPD, electrolyte abnormalities with prolonged QT.  QT abnormality resolved after electrolyte repletion.  She had possible temporal arteritis symptoms and was preemptively treated with prednisone.  She is seen rheumatology in the meantime and it is thought that her sed rate elevation was related to acute illness and not temporal arteritis.  Discussed with patient.  She was treated supportively with antibiotics.  She improved to where she could be discharged.  She is here for follow-up today.  She is still on antibiotics.  See med list.  Discussed prednisone taper per Dr. Estanislado Pandy.   D/w pt about rinsing after advair use.   No fevers.   Breathing is clearly better.  No sputum.  Less cough.   Still a little hoarse.    She is trying to work through her separation agreement.  She sold her house and is looking for housing.  She has having trouble with maintaining her daily activities, possibly related to prednisone use and recent illness.  Ex: she isn't driving recently.    PMH and SH reviewed  ROS: Per HPI unless specifically indicated in ROS section   Meds, vitals, and allergies reviewed.   GEN: nad, alert and oriented HEENT: mucous membranes moist NECK: supple w/o LA CV: rrr.  PULM: ctab, no inc wob ABD: soft, +bs EXT: no edema SKIN: no acute rash

## 2017-11-07 ENCOUNTER — Other Ambulatory Visit: Payer: Self-pay | Admitting: Family Medicine

## 2017-11-07 DIAGNOSIS — R6510 Systemic inflammatory response syndrome (SIRS) of non-infectious origin without acute organ dysfunction: Secondary | ICD-10-CM

## 2017-11-07 NOTE — Assessment & Plan Note (Signed)
Due to pneumonia.  Recheck chest x-ray today.  Clearly improved.  Continue current antibiotics.  Recheck chest x-ray in 1 month.  Overall improving.  Okay for outpatient follow-up.  Recheck electrolytes today given history of recent electrolyte abnormality.  At this point still okay for outpatient follow-up. >25 minutes spent in face to face time with patient, >50% spent in counselling or coordination of care.

## 2017-11-07 NOTE — Assessment & Plan Note (Signed)
History of, but recent symptoms with sed rate elevation are likely not related to temporal arteritis.  Discussed.  She is on prednisone taper.

## 2017-11-09 ENCOUNTER — Telehealth: Payer: Self-pay | Admitting: *Deleted

## 2017-11-09 NOTE — Telephone Encounter (Signed)
Patient advised.

## 2017-11-09 NOTE — Telephone Encounter (Signed)
It wasn't listed in the discharge summary.  I think that was resulted after the fact and that is why she is getting a call now.  It is covered by [employer] and since she is improving, then she should be considered treated.  She should do well from this point on.  I would still get the f/u studies done as planned.  If worse/regressing, then let me know.  Thanks.

## 2017-11-09 NOTE — Telephone Encounter (Signed)
I phoned the patient to give lab results and schedule lab/CXR appt and patient states she just received a call saying that she tested positive for Legionella.  Patient asks if you have been informed of this and if she needs to do anything about it?

## 2017-11-24 ENCOUNTER — Ambulatory Visit (INDEPENDENT_AMBULATORY_CARE_PROVIDER_SITE_OTHER)
Admission: RE | Admit: 2017-11-24 | Discharge: 2017-11-24 | Disposition: A | Discharge status: Home / Self Care | Payer: BC Managed Care – PPO | Source: Ambulatory Visit | Attending: Family Medicine | Admitting: Family Medicine

## 2017-11-24 ENCOUNTER — Ambulatory Visit: Payer: BC Managed Care – PPO | Admitting: Family Medicine

## 2017-11-24 ENCOUNTER — Encounter: Payer: Self-pay | Admitting: Family Medicine

## 2017-11-24 VITALS — BP 136/90 | HR 86 | Temp 98.2°F | Ht 66.0 in | Wt 160.5 lb

## 2017-11-24 DIAGNOSIS — J189 Pneumonia, unspecified organism: Secondary | ICD-10-CM | POA: Insufficient documentation

## 2017-11-24 DIAGNOSIS — J181 Lobar pneumonia, unspecified organism: Secondary | ICD-10-CM | POA: Diagnosis not present

## 2017-11-24 DIAGNOSIS — R0789 Other chest pain: Secondary | ICD-10-CM

## 2017-11-24 DIAGNOSIS — J449 Chronic obstructive pulmonary disease, unspecified: Secondary | ICD-10-CM | POA: Insufficient documentation

## 2017-11-24 NOTE — Progress Notes (Signed)
Subjective:    Patient ID: April [name], female    DOB: 09/04/1955, 62 y.o.   MRN: [id number]  HPI 62 yo pt of Dr Damita Dunnings here with fatigue/cp April [name]  Was diagnosed with pneumonia 11/1-thinks she may still have it   A/P: as follows  Problem: SIRS (systemic inflammatory response syndrome) ([place])  Editor: [name], MD (Physician)    Due to pneumonia.  Recheck chest x-ray today.  Clearly improved.  Continue current antibiotics.  Recheck chest x-ray in 1 month.  Overall improving.  Okay for outpatient follow-up.  Recheck electrolytes today given history of recent electrolyte abnormality.  At this point still okay for outpatient follow-up. >25 minutes spent in face to face time with patient, >50% spent in counselling or coordination of care.     DG Chest 2 View (Accession [id number]) (Order [id number])  Imaging  Date: 11/05/2017 Department: [place] [facility] Ordering/Authorizing: [name], MD  Exam Information   Status Exam Begun  Exam Ended   Final [99] 11/05/2017 12:24 PM 11/05/2017 12:41 PM  PACS Images   Show images for DG Chest 2 View  Study Result   CLINICAL DATA:  Follow-up pneumonia  EXAM: CHEST - 2 VIEW  COMPARISON:  10/28/17  FINDINGS: Cardiac shadows within normal limits. Aortic calcifications are noted. The lungs are hyperinflated consistent with COPD. Some left basilar atelectasis/infiltrate is again identified and stable. No sizable effusion is seen. No bony abnormality is noted.  IMPRESSION: Stable changes when compared with the exam from 8 days previous. Persistent left atelectasis/infiltrate is noted.   Electronically Signed   By: Inez Catalina M.D.   On: 11/05/2017 14:01  Had prev been tx for legionella with azithro     Wt Readings from Last 3 Encounters:  11/24/17 160 lb 8 oz (72.8 kg)  11/05/17 160 lb 8 oz (72.8 kg)  11/04/17 161 lb 6.4 oz (73.2 kg)    Felt better at last visit on abx Finished them    Finishing prednisone - 4 d left  Stays hoarse  Still sore in L lower ribs (esp to take a deep breath) occ cough when throat feels dry/not bad  Tired  Clammy feeling but no fever   Temp: 98.2 F (36.8 C)  Pulse ox 95% on RA  Has copd as well -does not need rescue inhaler often  Doing well  Not smoking   No nasal symptoms  No headache  No ST   Patient Active Problem List   Diagnosis Date Noted  . CAP (community acquired pneumonia) 11/24/2017  . Left-sided chest wall pain 11/24/2017  . SIRS (systemic inflammatory response syndrome) ([place]) 10/28/2017  . CKD (chronic kidney disease), stage III ([place]) 10/28/2017  . Dizziness 10/27/2017  . Headache 10/27/2017  . Creatinine elevation 08/26/2017  . SOB (shortness of breath) 08/26/2017  . Tachycardia 06/29/2017  . HTN (hypertension) 06/23/2017  . Polymyalgia rheumatica ([place]) 05/22/2016  . History of bilateral hip replacements 05/22/2016  . DDD (degenerative disc disease), lumbar 05/22/2016  . Benign paroxysmal positional vertigo 05/20/2016  . Colon cancer screening 08/20/2015  . Advance care planning 08/14/2014  . Vitamin D deficiency 08/14/2014  . COPD (chronic obstructive pulmonary disease) ([place]) 06/04/2014  . Avascular necrosis of bone of right hip ([place]) 10/06/2012  . Routine general medical examination at a health care facility 04/07/2011  . Osteopenia 06/09/2010  . AVN (avascular necrosis of bone) ([place]) 05/16/2010  . ASTHMA 03/23/2010  . Osteoarthritis 03/23/2010  . Hypothyroidism 03/21/2010  .  ANXIETY DEPRESSION 03/21/2010  . SMOKER 03/21/2010  . SKIN LESION 03/21/2010  . TEMPORAL ARTERITIS 03/21/2010   Past Medical History:  Diagnosis Date  . Anxiety   . Anxiety and depression    related to caring for mother during terminal illness  . Arthritis   . Asthma   . AVN (avascular necrosis of bone) (H[facility])    hip and wrist  . Bronchitis    hx of  . COPD (chronic obstructive pulmonary disease) ([place])   . Depression    . Endometriosis   . FH: CAD (coronary artery disease)   . History of palpitations   . Hypothyroid   . Osteopenia    forteo through Dr. Estanislado Pandy (started 10/12)  . PMR (polymyalgia rheumatica) (H[facility])   . SIRS (systemic inflammatory response syndrome) ([place]) 10/2017  . Smoker   . SOB (shortness of breath) on exertion   . Squamous cell carcinoma    facial, 2016  . Temporal arteritis (H[facility])    s/p prednisone taper   Past Surgical History:  Procedure Laterality Date  . ABDOMINAL ADHESION SURGERY    . ABDOMINAL HYSTERECTOMY    . BREAST SURGERY Left    benign bx 1990  . CARDIAC CATHETERIZATION  2007   no PCI  . CHOLECYSTECTOMY    . COLONOSCOPY W/ POLYPECTOMY    . INCONTINENCE SURGERY     2007   . JOINT REPLACEMENT Left 2012   left hip  . TONSILLECTOMY    . TOTAL HIP ARTHROPLASTY     L hip 2012  . TOTAL HIP ARTHROPLASTY Right 10/04/2012   Procedure: TOTAL HIP ARTHROPLASTY;  Surgeon: Garald Balding, MD;  Location: [facility];  Service: Orthopedics;  Laterality: Right;  . WRIST SURGERY     2012/ left wrist/ bone removed due to necrosis   Social History   Tobacco Use  . Smoking status: Former Smoker    Packs/day: 0.33    Years: 34.00    Pack years: 11.22    Types: Cigarettes    Last attempt to quit: 10/24/2017    Years since quitting: 0.0  . Smokeless tobacco: Never Used  . Tobacco comment: trying to quit  Substance Use Topics  . Alcohol use: Yes    Alcohol/week: 0.0 standard drinks    Comment: occasional  . Drug use: Never   Family History  Problem Relation Age of Onset  . Heart disease Mother   . Hypertension Mother   . Alzheimer's disease Mother   . Colon cancer Mother   . Kidney failure Mother   . Arthritis Brother   . Suicidality Brother   . Colon polyps Brother   . Breast cancer Maternal Aunt   . Healthy Son    Allergies  Allergen Reactions  . Sulfonamide Derivatives     REACTION: As a child.  Throat closed, rash.  . Alendronate Sodium     GI upset  .  Dilaudid [Hydromorphone Hcl] Nausea And Vomiting   Current Outpatient Medications on File Prior to Visit  Medication Sig Dispense Refill  . acetaminophen (TYLENOL) 325 MG tablet Take 2 tablets (650 mg total) by mouth every 6 (six) hours as needed. (Patient taking differently: Take 650 mg by mouth every 6 (six) hours as needed for mild pain. )    . Albuterol Sulfate (PROAIR RESPICLICK) 952 (90 Base) MCG/ACT AEPB Inhale 2 puffs into the lungs every 6 (six) hours as needed (for shortness of breath). 1 each 1  . ALPRAZolam (XANAX) 0.5 MG tablet  Take 0.5-1 tablets (0.25-0.5 mg total) by mouth 2 (two) times daily as needed for anxiety. 60 tablet 0  . Bioflavonoid Products (VITAMIN C) CHEW Chew 1 tablet by mouth 2 (two) times daily.     Marland Kitchen buPROPion (WELLBUTRIN XL) 300 MG 24 hr tablet Take 1 tablet (300 mg total) by mouth daily. 90 tablet 3  . Calcium 500 MG CHEW Chew 1 tablet by mouth 2 (two) times daily.     . Cholecalciferol (EQL VITAMIN D3) 1000 UNITS tablet Take 3 tablets (3,000 Units total) by mouth daily.    . Cyanocobalamin (VITAMIN B12 PO) Take 1 tablet by mouth 2 (two) times a week.     . Fluticasone-Salmeterol (ADVAIR DISKUS) 250-50 MCG/DOSE AEPB INHALE 1 PUFF BY MOUTH 2 TIMES DAILY. (Patient taking differently: Inhale 1 puff into the lungs 2 (two) times daily. ) 60 each prn  . levothyroxine (SYNTHROID, LEVOTHROID) 125 MCG tablet Take 1 tablet (125 mcg total) by mouth daily. Except for 1.5 tabs on Sundays 100 tablet 3  . meclizine (ANTIVERT) 12.5 MG tablet Take 1 tablet (12.5 mg total) by mouth 3 (three) times daily as needed for dizziness. 30 tablet 1  . methocarbamol (ROBAXIN) 500 MG tablet Take 1 tablet (500 mg total) by mouth 2 (two) times daily as needed for muscle spasms. 60 tablet 3  . metoprolol succinate (TOPROL-XL) 25 MG 24 hr tablet Take 1 tablet (25 mg total) by mouth daily. 90 tablet 3  . Multiple Vitamin (MULTIVITAMIN WITH MINERALS) TABS tablet Take 1 tablet by mouth every other  day.     Vladimir Faster Glycol-Propyl Glycol (SYSTANE OP) Apply 1 drop to eye daily as needed (dryness).    . predniSONE (DELTASONE) 10 MG tablet Take 5 tablets po x4 days, 4 tablets po x4 days, 3 tablets po x4 days, 2 tablets po x4 days, 1 tablet po x4 days, 1/2 tablet po x4 days 62 tablet 0  . Tiotropium Bromide Monohydrate (SPIRIVA RESPIMAT) 2.5 MCG/ACT AERS Inhale 2 puffs into the lungs daily. 4 g 12  . TURMERIC PO Take 1 tablet by mouth once a week.     No current facility-administered medications on file prior to visit.     Review of Systems  Constitutional: Positive for diaphoresis and fatigue. Negative for activity change, appetite change, fever and unexpected weight change.       Clammy feeling -not actually sweats   HENT: Positive for postnasal drip and voice change. Negative for congestion, ear pain, rhinorrhea, sinus pressure and sore throat.        Dry throat (not sore)  Eyes: Negative for pain, redness and visual disturbance.  Respiratory: Positive for cough. Negative for chest tightness, shortness of breath, wheezing and stridor.        Very little cough at this point Chest wall pain left  Cardiovascular: Negative for chest pain and palpitations.  Gastrointestinal: Negative for abdominal pain, blood in stool, constipation and diarrhea.  Endocrine: Negative for polydipsia and polyuria.  Genitourinary: Negative for dysuria, frequency and urgency.  Musculoskeletal: Negative for arthralgias, back pain and myalgias.       Rib pain on L worse with exertion   Skin: Negative for pallor and rash.  Allergic/Immunologic: Negative for environmental allergies.  Neurological: Negative for dizziness, syncope and headaches.  Hematological: Negative for adenopathy. Does not bruise/bleed easily.  Psychiatric/Behavioral: Negative for decreased concentration and dysphoric mood. The patient is not nervous/anxious.        Objective:   Physical Exam  Constitutional: She appears well-developed  and well-nourished. No distress.  Well appearing   HENT:  Head: Normocephalic and atraumatic.  Right Ear: External ear normal.  Left Ear: External ear normal.  Mouth/Throat: Oropharynx is clear and moist. No oropharyngeal exudate.  Mild clear pnd  Throat is clear TMs nl   Eyes: Pupils are equal, round, and reactive to light. Conjunctivae and EOM are normal. Right eye exhibits no discharge. Left eye exhibits no discharge. No scleral icterus.  Neck: Normal range of motion. Neck supple. No JVD present. Carotid bruit is not present. No tracheal deviation present. No thyromegaly present.  Cardiovascular: Normal rate, regular rhythm, normal heart sounds and intact distal pulses. Exam reveals no gallop.  Pulmonary/Chest: Effort normal and breath sounds normal. No stridor. No respiratory distress. She has no wheezes. She has no rales. She exhibits tenderness.  No crackles  No rales/rhonchi No wheeze even on forced exp   L anterior chest wall tenderness  No crepitus or skin change  No step off to palpation  Abdominal: Soft. Bowel sounds are normal. She exhibits no distension, no abdominal bruit and no mass. There is no tenderness.  Musculoskeletal: She exhibits no edema or deformity.  Lymphadenopathy:    She has no cervical adenopathy.  Neurological: She is alert. She has normal reflexes. She displays normal reflexes. No cranial nerve deficit. She exhibits normal muscle tone. Coordination normal.  Skin: Skin is warm and dry. No rash noted. No erythema. No pallor.  Psychiatric: She has a normal mood and affect.  Pleasant           Assessment & Plan:   Problem List Items Addressed This Visit      Respiratory   CAP (community acquired pneumonia) - Primary    Still now feeling 100% - fatigue, clammy feeling, hoarseness No sob and seldom coughs however and no fever L anterior pleuritic cwp - reproduced on exam   Re check cxr today as well as rib films Exam reassuring  Adv to finish  prednisone and alert if no further improvement        Relevant Orders   DG Chest 2 View (Completed)     Other   Left-sided chest wall pain    S/p pneumonia/respiratory illness  Tender to palpation  Suspect costochondral pain from coughing  Rib films today as well as cxr Adv use of heat and analgesics       Relevant Orders   DG Ribs Unilateral Left (Completed)

## 2017-11-24 NOTE — Patient Instructions (Addendum)
Continue heat to affected area   Tylenol as needed   Xray of chest and ribs today   If you develop fever/ shortness of breath or cough -please alert [place]  Watch for a rash also   Push fluids -keep sipping

## 2017-11-25 NOTE — Assessment & Plan Note (Addendum)
Still now feeling 100% - fatigue, clammy feeling, hoarseness No sob and seldom coughs however and no fever L anterior pleuritic cwp - reproduced on exam   Re check cxr today as well as rib films Exam reassuring  Adv to finish prednisone and alert if no further improvement

## 2017-11-25 NOTE — Assessment & Plan Note (Signed)
S/p pneumonia/respiratory illness  Tender to palpation  Suspect costochondral pain from coughing  Rib films today as well as cxr Adv use of heat and analgesics

## 2017-12-09 ENCOUNTER — Telehealth: Payer: Self-pay | Admitting: *Deleted

## 2017-12-09 ENCOUNTER — Encounter: Payer: Self-pay | Admitting: Family Medicine

## 2017-12-09 ENCOUNTER — Ambulatory Visit: Payer: BC Managed Care – PPO | Admitting: Family Medicine

## 2017-12-09 ENCOUNTER — Ambulatory Visit (INDEPENDENT_AMBULATORY_CARE_PROVIDER_SITE_OTHER)
Admission: RE | Admit: 2017-12-09 | Discharge: 2017-12-09 | Disposition: A | Discharge status: Home / Self Care | Payer: BC Managed Care – PPO | Source: Ambulatory Visit | Attending: Family Medicine | Admitting: Family Medicine

## 2017-12-09 ENCOUNTER — Other Ambulatory Visit (INDEPENDENT_AMBULATORY_CARE_PROVIDER_SITE_OTHER): Payer: BC Managed Care – PPO

## 2017-12-09 DIAGNOSIS — F341 Dysthymic disorder: Secondary | ICD-10-CM | POA: Diagnosis not present

## 2017-12-09 DIAGNOSIS — R6510 Systemic inflammatory response syndrome (SIRS) of non-infectious origin without acute organ dysfunction: Secondary | ICD-10-CM

## 2017-12-09 DIAGNOSIS — J189 Pneumonia, unspecified organism: Secondary | ICD-10-CM

## 2017-12-09 DIAGNOSIS — J181 Lobar pneumonia, unspecified organism: Secondary | ICD-10-CM

## 2017-12-09 DIAGNOSIS — K219 Gastro-esophageal reflux disease without esophagitis: Secondary | ICD-10-CM

## 2017-12-09 DIAGNOSIS — M62838 Other muscle spasm: Secondary | ICD-10-CM

## 2017-12-09 DIAGNOSIS — J45909 Unspecified asthma, uncomplicated: Secondary | ICD-10-CM | POA: Diagnosis not present

## 2017-12-09 LAB — CBC WITH DIFFERENTIAL/PLATELET
BASOS PCT: 1.2 % (ref 0.0–3.0)
Basophils Absolute: 0.1 10*3/uL (ref 0.0–0.1)
Eosinophils Absolute: 0.1 10*3/uL (ref 0.0–0.7)
Eosinophils Relative: 1.7 % (ref 0.0–5.0)
HCT: 38.8 % (ref 36.0–46.0)
Hemoglobin: 12.9 g/dL (ref 12.0–15.0)
LYMPHS ABS: 1.5 10*3/uL (ref 0.7–4.0)
Lymphocytes Relative: 23.2 % (ref 12.0–46.0)
MCHC: 33.1 g/dL (ref 30.0–36.0)
MCV: 92.9 fl (ref 78.0–100.0)
MONO ABS: 0.6 10*3/uL (ref 0.1–1.0)
Monocytes Relative: 8.7 % (ref 3.0–12.0)
NEUTROS ABS: 4.3 10*3/uL (ref 1.4–7.7)
NEUTROS PCT: 65.2 % (ref 43.0–77.0)
PLATELETS: 223 10*3/uL (ref 150.0–400.0)
RBC: 4.18 Mil/uL (ref 3.87–5.11)
RDW: 15.8 % — AB (ref 11.5–15.5)
WBC: 6.6 10*3/uL (ref 4.0–10.5)

## 2017-12-09 MED ORDER — ALPRAZOLAM 0.5 MG PO TABS: 0.2500 mg | ORAL_TABLET | Freq: Two times a day (BID) | ORAL | 0 refills | Status: DC | PRN

## 2017-12-09 MED ORDER — ALBUTEROL SULFATE 108 (90 BASE) MCG/ACT IN AEPB: 2.0000 | INHALATION_SPRAY | Freq: Four times a day (QID) | RESPIRATORY_TRACT | 5 refills | Status: DC | PRN

## 2017-12-09 MED ORDER — TIZANIDINE HCL 4 MG PO TABS: 2.0000 mg | ORAL_TABLET | Freq: Four times a day (QID) | ORAL | 3 refills | Status: DC | PRN

## 2017-12-09 MED ORDER — OMEPRAZOLE 20 MG PO CPDR: 20.0000 mg | DELAYED_RELEASE_CAPSULE | Freq: Every day | ORAL | 3 refills | Status: DC

## 2017-12-09 MED ORDER — BUDESONIDE-FORMOTEROL FUMARATE 80-4.5 MCG/ACT IN AERO: 2.0000 | INHALATION_SPRAY | Freq: Two times a day (BID) | RESPIRATORY_TRACT | 12 refills | Status: DC

## 2017-12-09 NOTE — Patient Instructions (Addendum)
Check on symbicort for advair.  Check on [facility] for a coupon for wellbutrin and your inhalers.    If you get the symbicort, then try stopping the spiriva.  See how you feel with that.  Update me as needed.    Try tizanidine instead of robaxin.    Go to the lab on the way out.  We'll contact you with your xray report.  Take care.  Glad to see you.

## 2017-12-09 NOTE — Progress Notes (Signed)
She has no residual chest pain.  D/w pt about f/u CXR today.   See follow-up imaging report.  Methocarbamol isn't covered for patient, but it helped.  D/w pt about trial of tizanidine.  Prescription sent.  SABA is expensive for patient.  We talked about alternative formulations of albuterol that should be cheaper.  See orders.  Her other inhalers are still really expensive.  We talked about potentially stopping spiriva since she isn't smoking.  She may be able to do well on Advair or similar without Spiriva.  She has been taking omeprazole 20mg  a day, OTC.  Asked about changing to rx.  She improved on med.  No ADE on med.  Reasonable to continue.  Prescription sent.  She is going to court with her exhusband and she has used xanax rarely, d/w pt.  She is trying to work through the situation.  No Si/Hi.  Safe at home.    Meds, vitals, and allergies reviewed.   ROS: Per HPI unless specifically indicated in ROS section   GEN: nad, alert and oriented HEENT: mucous membranes moist NECK: supple w/o LA CV: rrr PULM: ctab, no inc wob ABD: soft, +bs EXT: no edema SKIN: no acute rash

## 2017-12-09 NOTE — Telephone Encounter (Signed)
[facility] radiology contacted office and states pts xray results are available in Epic

## 2017-12-09 NOTE — Telephone Encounter (Signed)
Spoke to pt who states she was seen today and given new Rx for proair and symbicort due to out of pocket costs. Pt states that these new Rx costs the same as the previous medications and is wanting them to be switched back to the original Rx. She is wanting to d/c symbicort and restart advair, and d/c proair, but is unaware what she was taking prior, but "Dr Damita Dunnings would know." pls advise

## 2017-12-10 ENCOUNTER — Other Ambulatory Visit: Payer: Self-pay | Admitting: Family Medicine

## 2017-12-10 DIAGNOSIS — R918 Other nonspecific abnormal finding of lung field: Secondary | ICD-10-CM

## 2017-12-10 MED ORDER — ALBUTEROL SULFATE HFA 108 (90 BASE) MCG/ACT IN AERS: INHALATION_SPRAY | RESPIRATORY_TRACT | 99 refills | Status: DC

## 2017-12-10 MED ORDER — FLUTICASONE-SALMETEROL 250-50 MCG/DOSE IN AEPB: 1.0000 | INHALATION_SPRAY | Freq: Two times a day (BID) | RESPIRATORY_TRACT | 12 refills | Status: DC

## 2017-12-10 NOTE — Telephone Encounter (Signed)
See result note.  Thanks!

## 2017-12-10 NOTE — Telephone Encounter (Signed)
I changed the symbicort back to advair and I redid the albuterol rx.   If the pharmacy has a specific way it needs to be written, then let me know.    See result notes on CXR and CBC.   Thanks.

## 2017-12-10 NOTE — Telephone Encounter (Signed)
Patient advised.

## 2017-12-12 DIAGNOSIS — K219 Gastro-esophageal reflux disease without esophagitis: Secondary | ICD-10-CM | POA: Insufficient documentation

## 2017-12-12 NOTE — Assessment & Plan Note (Signed)
Continue PPI as ordered.  She may be able to taper in the future.  She stopped smoking.

## 2017-12-12 NOTE — Assessment & Plan Note (Signed)
Significant social stressors noted.  No suicidal homicidal intent.  Continue as needed use of Xanax.  She uses this rarely.  No adverse effect on medication.  Routine cautions given.

## 2017-12-12 NOTE — Assessment & Plan Note (Signed)
SABA is expensive for patient.  We talked about alternative formulations of albuterol that should be cheaper.  See orders.  Her other inhalers are still really expensive.  We talked about potentially stopping spiriva since she isn't smoking.  She may be able to do well on Advair or similar without Spiriva.  See orders.

## 2017-12-12 NOTE — Assessment & Plan Note (Signed)
See notes on follow-up chest x-ray.

## 2017-12-13 DIAGNOSIS — M62838 Other muscle spasm: Secondary | ICD-10-CM | POA: Insufficient documentation

## 2017-12-13 NOTE — Assessment & Plan Note (Signed)
Reasonable to try prn tizanidine.  Routine cautions given.  She'll update me as needed.

## 2017-12-16 ENCOUNTER — Other Ambulatory Visit: Payer: BC Managed Care – PPO

## 2017-12-16 ENCOUNTER — Ambulatory Visit
Admission: RE | Admit: 2017-12-16 | Discharge: 2017-12-16 | Disposition: A | Discharge status: Home / Self Care | Payer: BC Managed Care – PPO | Source: Ambulatory Visit | Attending: Family Medicine | Admitting: Family Medicine

## 2017-12-16 DIAGNOSIS — R918 Other nonspecific abnormal finding of lung field: Secondary | ICD-10-CM

## 2017-12-17 ENCOUNTER — Encounter: Payer: Self-pay | Admitting: Family Medicine

## 2017-12-17 DIAGNOSIS — R911 Solitary pulmonary nodule: Secondary | ICD-10-CM | POA: Insufficient documentation

## 2017-12-17 DIAGNOSIS — I700 Atherosclerosis of aorta: Secondary | ICD-10-CM | POA: Insufficient documentation

## 2017-12-19 ENCOUNTER — Other Ambulatory Visit: Payer: Self-pay | Admitting: Family Medicine

## 2017-12-19 DIAGNOSIS — N6459 Other signs and symptoms in breast: Secondary | ICD-10-CM

## 2017-12-19 DIAGNOSIS — R9389 Abnormal findings on diagnostic imaging of other specified body structures: Secondary | ICD-10-CM

## 2017-12-22 ENCOUNTER — Ambulatory Visit: Payer: BC Managed Care – PPO | Admitting: Rheumatology

## 2017-12-23 ENCOUNTER — Other Ambulatory Visit: Payer: Self-pay | Admitting: Family Medicine

## 2017-12-23 ENCOUNTER — Ambulatory Visit: Payer: BC Managed Care – PPO

## 2017-12-23 ENCOUNTER — Ambulatory Visit
Admission: RE | Admit: 2017-12-23 | Discharge: 2017-12-23 | Disposition: A | Discharge status: Home / Self Care | Payer: BC Managed Care – PPO | Source: Ambulatory Visit | Attending: Family Medicine | Admitting: Family Medicine

## 2017-12-23 DIAGNOSIS — R9389 Abnormal findings on diagnostic imaging of other specified body structures: Secondary | ICD-10-CM

## 2017-12-23 DIAGNOSIS — N6459 Other signs and symptoms in breast: Secondary | ICD-10-CM

## 2017-12-23 MED ORDER — FLUTICASONE-SALMETEROL 250-50 MCG/DOSE IN AEPB: 1.0000 | INHALATION_SPRAY | Freq: Two times a day (BID) | RESPIRATORY_TRACT | 12 refills | Status: DC

## 2017-12-23 NOTE — Progress Notes (Signed)
Please fax in.  I couldn't get it to go through electronically.  Thanks.   Faxed.  LFuller Plan, CMA  12-23-17 10:18 AM

## 2018-02-18 ENCOUNTER — Other Ambulatory Visit: Payer: Self-pay | Admitting: Family Medicine

## 2018-02-18 NOTE — Telephone Encounter (Signed)
Electronic refill request. Tizanidine Last office visit:   12/09/17 Last Filled:    30 tablet 3 12/09/2017  Please advise.

## 2018-02-20 NOTE — Telephone Encounter (Signed)
Sent. Thanks.   

## 2018-03-15 ENCOUNTER — Telehealth: Payer: Self-pay

## 2018-03-15 NOTE — Telephone Encounter (Signed)
Patient will call back in with which Mail Order Pharmacy and the specific medications that she wants sent there.  Also patient states she is going to change local pharmacies to [facility] but doesn't know which location.  Patient will leave that information also upon her return call.

## 2018-03-15 NOTE — Telephone Encounter (Signed)
Pt left v/m; as of 03/06/18 pt is on medicare; pt is requesting new rxs for her meds to go to a mail order pharmacy. Pt is requesting 2 rx for each med so if needs med from local pharmacy until gets meds from mail order pharmacy. Pt request cb.

## 2018-03-16 NOTE — Telephone Encounter (Signed)
Pt called with updates regarding insurance. She would like to speak with [place] only.

## 2018-03-22 ENCOUNTER — Other Ambulatory Visit: Payer: Self-pay | Admitting: *Deleted

## 2018-03-22 MED ORDER — BUPROPION HCL ER (XL) 300 MG PO TB24: 300.0000 mg | ORAL_TABLET | Freq: Every day | ORAL | 3 refills | Status: DC

## 2018-03-22 MED ORDER — METOPROLOL SUCCINATE ER 25 MG PO TB24: 25.0000 mg | ORAL_TABLET | Freq: Every day | ORAL | 3 refills | Status: DC

## 2018-03-22 MED ORDER — LEVOTHYROXINE SODIUM 125 MCG PO TABS: 125.0000 ug | ORAL_TABLET | Freq: Every day | ORAL | 3 refills | Status: DC

## 2018-03-22 NOTE — Telephone Encounter (Signed)
Patient requests 90 day supply sent to Mail Order Pharmacy.  Tizanidine Last office visit:   12/09/2017 Last Filled:    30 tablet 3 02/20/2018  Please advise.

## 2018-03-22 NOTE — Telephone Encounter (Signed)
Spoke with patient.  Mail Order Pharmacy identified as [facility].  4 medications were sent to [facility] as requested by patient.  Patient states she will get her inhalers locally.

## 2018-03-23 MED ORDER — TIZANIDINE HCL 4 MG PO TABS: ORAL_TABLET | ORAL | 3 refills | Status: DC

## 2018-03-23 NOTE — Telephone Encounter (Signed)
Sent. Thanks.   

## 2018-04-06 ENCOUNTER — Telehealth: Payer: Self-pay | Admitting: *Deleted

## 2018-04-06 MED ORDER — ALBUTEROL SULFATE HFA 108 (90 BASE) MCG/ACT IN AERS: 2.0000 | INHALATION_SPRAY | Freq: Four times a day (QID) | RESPIRATORY_TRACT | 99 refills | Status: DC | PRN

## 2018-04-06 NOTE — Telephone Encounter (Signed)
Received a fax from pharmacy stating that they are requesting a new Rx because insurance requires Proair inhaler. Pharmacy is requesting a new script be sent in for Proair.  Spoke to patient by telephone and was advised that she is using [facility] for her inhalers. Patient stated that she requested that the pharmacy request the change because of cost.

## 2018-04-06 NOTE — Telephone Encounter (Signed)
Sent. Thanks.   

## 2018-04-08 ENCOUNTER — Ambulatory Visit: Payer: BC Managed Care – PPO | Admitting: Physician Assistant
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
## 2018-05-24 ENCOUNTER — Ambulatory Visit: Payer: Self-pay | Admitting: Physician Assistant
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
## 2018-06-02 ENCOUNTER — Ambulatory Visit (HOSPITAL_COMMUNITY)
Admission: EM | Admit: 2018-06-02 | Discharge: 2018-06-02 | Disposition: A | Discharge status: Home / Self Care | Payer: Medicare Other | Attending: Internal Medicine | Admitting: Internal Medicine

## 2018-06-02 ENCOUNTER — Encounter (HOSPITAL_COMMUNITY): Payer: Self-pay

## 2018-06-02 ENCOUNTER — Other Ambulatory Visit: Payer: Self-pay

## 2018-06-02 DIAGNOSIS — M62838 Other muscle spasm: Secondary | ICD-10-CM
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
## 2018-06-03 ENCOUNTER — Telehealth: Payer: Self-pay
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
## 2018-06-03 NOTE — Telephone Encounter (Signed)
Per chart review pt went to [facility] on 06/02/18.

## 2018-06-05 NOTE — Telephone Encounter (Signed)
Please get update on patient.  Thanks. 

## 2018-06-06 NOTE — Telephone Encounter (Signed)
Pt returned your call   She stated she is doing fine. A little sore on neck and left shoulder  She wanted to  Thank you for checking on her

## 2018-06-06 NOTE — Telephone Encounter (Signed)
Left detailed message on voicemail to return call with update. °

## 2018-06-07 NOTE — Telephone Encounter (Signed)
Noted. Thanks.

## 2018-06-10 ENCOUNTER — Ambulatory Visit: Payer: Medicare Other | Admitting: Physician Assistant

## 2018-06-10 ENCOUNTER — Encounter: Payer: Self-pay | Admitting: Physician Assistant

## 2018-06-10 ENCOUNTER — Other Ambulatory Visit: Payer: Self-pay

## 2018-06-10 VITALS — BP 132/83 | HR 81 | Resp 13 | Ht 66.0 in | Wt 177.2 lb

## 2018-06-10 DIAGNOSIS — Z8659 Personal history of other mental and behavioral disorders: Secondary | ICD-10-CM

## 2018-06-10 DIAGNOSIS — M8589 Other specified disorders of bone density and structure, multiple sites: Secondary | ICD-10-CM

## 2018-06-10 DIAGNOSIS — Z8639 Personal history of other endocrine, nutritional and metabolic disease: Secondary | ICD-10-CM

## 2018-06-10 DIAGNOSIS — M316 Other giant cell arteritis: Secondary | ICD-10-CM

## 2018-06-10 DIAGNOSIS — M5136 Other intervertebral disc degeneration, lumbar region: Secondary | ICD-10-CM | POA: Diagnosis not present

## 2018-06-10 DIAGNOSIS — Z8709 Personal history of other diseases of the respiratory system: Secondary | ICD-10-CM

## 2018-06-10 DIAGNOSIS — M51369 Other intervertebral disc degeneration, lumbar region without mention of lumbar back pain or lower extremity pain: Secondary | ICD-10-CM

## 2018-06-10 DIAGNOSIS — Z96643 Presence of artificial hip joint, bilateral: Secondary | ICD-10-CM | POA: Diagnosis not present

## 2018-06-10 DIAGNOSIS — M353 Polymyalgia rheumatica: Secondary | ICD-10-CM

## 2018-06-10 DIAGNOSIS — F17200 Nicotine dependence, unspecified, uncomplicated: Secondary | ICD-10-CM

## 2018-06-10 NOTE — Patient Instructions (Signed)

## 2018-06-14 ENCOUNTER — Encounter: Payer: Self-pay | Admitting: Gastroenterology

## 2018-06-19 ENCOUNTER — Telehealth: Payer: Self-pay | Admitting: Family Medicine

## 2018-06-19 DIAGNOSIS — R918 Other nonspecific abnormal finding of lung field: Secondary | ICD-10-CM

## 2018-06-19 NOTE — Telephone Encounter (Signed)
She is due for f/u chest CT re: pulmonary nodule.  I ordered it.  Thanks.

## 2018-07-04 ENCOUNTER — Other Ambulatory Visit: Payer: Self-pay

## 2018-07-04 ENCOUNTER — Ambulatory Visit
Admission: RE | Admit: 2018-07-04 | Discharge: 2018-07-04 | Disposition: A | Discharge status: Home / Self Care | Payer: Medicare Other | Source: Ambulatory Visit | Attending: Family Medicine | Admitting: Family Medicine

## 2018-07-04 DIAGNOSIS — J439 Emphysema, unspecified: Secondary | ICD-10-CM | POA: Diagnosis not present

## 2018-07-04 DIAGNOSIS — R918 Other nonspecific abnormal finding of lung field: Secondary | ICD-10-CM | POA: Diagnosis not present

## 2018-07-07 ENCOUNTER — Other Ambulatory Visit: Payer: Self-pay | Admitting: Family Medicine

## 2018-07-07 ENCOUNTER — Telehealth: Payer: Self-pay | Admitting: Gastroenterology

## 2018-07-07 DIAGNOSIS — R9389 Abnormal findings on diagnostic imaging of other specified body structures: Secondary | ICD-10-CM

## 2018-07-07 DIAGNOSIS — R911 Solitary pulmonary nodule: Secondary | ICD-10-CM

## 2018-07-07 DIAGNOSIS — Z1231 Encounter for screening mammogram for malignant neoplasm of breast: Secondary | ICD-10-CM

## 2018-07-07 NOTE — Telephone Encounter (Signed)
According to previous colonoscopy and path results from 2018, pt is scheduled for a recall colon in 3 years.  There are two recalls in our system.  Is this a duplicate?

## 2018-07-07 NOTE — Telephone Encounter (Signed)
Look like there a 2 recalls placed for this patient. Seems like she was originally scheduled for a recall in 2020, but had a colonoscopy in 2018, and recall then was placed for 3 years after that DOS. Please check behind me on correct recall date. Thank you

## 2018-07-13 NOTE — Telephone Encounter (Signed)
Pt notified and aware. Former recall deleted.

## 2018-07-13 NOTE — Telephone Encounter (Signed)
I see the confusion.  The 2020 recall was in the system after the 2010 colonoscopy.  After the 2018 colonoscopy, I set a new 3 year recall. Please apologize to patient on my behalf for confusion.    Patient should be recalled in October 2021 for colonoscopy.

## 2018-07-26 ENCOUNTER — Other Ambulatory Visit: Payer: Self-pay | Admitting: *Deleted

## 2018-07-26 MED ORDER — FLUTICASONE-SALMETEROL 250-50 MCG/DOSE IN AEPB: 1.0000 | INHALATION_SPRAY | Freq: Two times a day (BID) | RESPIRATORY_TRACT | 12 refills | Status: DC

## 2018-07-26 MED ORDER — SPIRIVA RESPIMAT 2.5 MCG/ACT IN AERS: 2.0000 | INHALATION_SPRAY | Freq: Every day | RESPIRATORY_TRACT | 12 refills | Status: DC

## 2018-07-26 MED ORDER — ALBUTEROL SULFATE HFA 108 (90 BASE) MCG/ACT IN AERS: 2.0000 | INHALATION_SPRAY | Freq: Four times a day (QID) | RESPIRATORY_TRACT | 99 refills | Status: DC | PRN

## 2018-07-27 ENCOUNTER — Ambulatory Visit: Payer: Medicare Other | Admitting: Pulmonary Disease

## 2018-07-27 ENCOUNTER — Other Ambulatory Visit: Payer: Self-pay

## 2018-07-27 ENCOUNTER — Encounter: Payer: Self-pay | Admitting: Pulmonary Disease

## 2018-07-27 VITALS — BP 122/84 | HR 90 | Ht 66.5 in | Wt 176.8 lb

## 2018-07-27 DIAGNOSIS — R918 Other nonspecific abnormal finding of lung field: Secondary | ICD-10-CM

## 2018-07-27 DIAGNOSIS — Z87891 Personal history of nicotine dependence: Secondary | ICD-10-CM | POA: Diagnosis not present

## 2018-07-27 DIAGNOSIS — R911 Solitary pulmonary nodule: Secondary | ICD-10-CM

## 2018-07-27 DIAGNOSIS — J449 Chronic obstructive pulmonary disease, unspecified: Secondary | ICD-10-CM

## 2018-07-27 DIAGNOSIS — J432 Centrilobular emphysema: Secondary | ICD-10-CM

## 2018-07-27 MED ORDER — BUDESONIDE-FORMOTEROL FUMARATE 160-4.5 MCG/ACT IN AERO: 2.0000 | INHALATION_SPRAY | Freq: Two times a day (BID) | RESPIRATORY_TRACT | 3 refills | Status: DC

## 2018-07-27 MED ORDER — BUDESONIDE-FORMOTEROL FUMARATE 160-4.5 MCG/ACT IN AERO: 2.0000 | INHALATION_SPRAY | Freq: Two times a day (BID) | RESPIRATORY_TRACT | 0 refills | Status: DC

## 2018-07-27 NOTE — Progress Notes (Signed)
Patient was seen today in a co-visit. Reviewed coverage for Symbicort and [facility] pharmacy quoted ~$45 copay. Patient was notified, she states she may be able to afford. Patient was advised to contact clinic if any concerns arise. See documentation under Dr. Juline Patch visit for details.
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
## 2018-07-27 NOTE — Patient Instructions (Signed)
Thank you for visiting Dr. Valeta Harms at [facility]. Today we recommend the following:  Orders Placed This Encounter  Procedures  . CT CHEST WO CONTRAST  . Pulmonary Function Test   Samples of symbicort Please hold Wixela while you are trying the symbicort.    Return in about 3 months (around 10/27/2018), or if symptoms worsen or fail to improve.   We will see you after your CT images are complete.     Please do your part to reduce the spread of COVID-19.

## 2018-08-17 ENCOUNTER — Other Ambulatory Visit: Payer: Self-pay

## 2018-08-17 ENCOUNTER — Ambulatory Visit
Admission: RE | Admit: 2018-08-17 | Discharge: 2018-08-17 | Disposition: A | Discharge status: Home / Self Care | Payer: Medicare Other | Source: Ambulatory Visit | Attending: Family Medicine | Admitting: Family Medicine

## 2018-08-17 DIAGNOSIS — Z1231 Encounter for screening mammogram for malignant neoplasm of breast: Secondary | ICD-10-CM

## 2018-08-22 ENCOUNTER — Other Ambulatory Visit: Payer: Self-pay | Admitting: Family Medicine

## 2018-08-22 DIAGNOSIS — E039 Hypothyroidism, unspecified: Secondary | ICD-10-CM

## 2018-08-22 DIAGNOSIS — M8580 Other specified disorders of bone density and structure, unspecified site: Secondary | ICD-10-CM

## 2018-08-22 DIAGNOSIS — I10 Essential (primary) hypertension: Secondary | ICD-10-CM

## 2018-08-23 ENCOUNTER — Telehealth: Payer: Self-pay | Admitting: Pulmonary Disease

## 2018-08-23 ENCOUNTER — Other Ambulatory Visit (INDEPENDENT_AMBULATORY_CARE_PROVIDER_SITE_OTHER): Payer: Medicare Other

## 2018-08-23 ENCOUNTER — Other Ambulatory Visit: Payer: Self-pay

## 2018-08-23 DIAGNOSIS — M8580 Other specified disorders of bone density and structure, unspecified site: Secondary | ICD-10-CM

## 2018-08-23 DIAGNOSIS — E039 Hypothyroidism, unspecified: Secondary | ICD-10-CM | POA: Diagnosis not present

## 2018-08-23 DIAGNOSIS — I10 Essential (primary) hypertension: Secondary | ICD-10-CM | POA: Diagnosis not present

## 2018-08-23 LAB — COMPREHENSIVE METABOLIC PANEL
ALT: 15 U/L (ref 0–35)
AST: 16 U/L (ref 0–37)
Albumin: 4.4 g/dL (ref 3.5–5.2)
Alkaline Phosphatase: 95 U/L (ref 39–117)
BUN: 24 mg/dL — ABNORMAL HIGH (ref 6–23)
CO2: 28 mEq/L (ref 19–32)
Calcium: 10 mg/dL (ref 8.4–10.5)
Chloride: 102 mEq/L (ref 96–112)
Creatinine, Ser: 1.2 mg/dL (ref 0.40–1.20)
GFR: 45.02 mL/min — ABNORMAL LOW (ref >60.00)
Glucose, Bld: 101 mg/dL — ABNORMAL HIGH (ref 70–99)
Potassium: 4.5 mEq/L (ref 3.5–5.1)
Sodium: 138 mEq/L (ref 135–145)
Total Bilirubin: 0.3 mg/dL (ref 0.2–1.2)
Total Protein: 7.5 g/dL (ref 6.0–8.3)

## 2018-08-23 LAB — LIPID PANEL
Cholesterol: 198 mg/dL (ref 0–200)
HDL: 62.6 mg/dL (ref >39.00)
LDL Cholesterol: 122 mg/dL — ABNORMAL HIGH (ref 0–99)
NonHDL: 135.76
Total CHOL/HDL Ratio: 3
Triglycerides: 68 mg/dL (ref 0.0–149.0)
VLDL: 13.6 mg/dL (ref 0.0–40.0)

## 2018-08-23 LAB — VITAMIN D 25 HYDROXY (VIT D DEFICIENCY, FRACTURES): VITD: 72.36 ng/mL (ref 30.00–100.00)

## 2018-08-23 LAB — TSH: TSH: 15.22 u[IU]/mL — ABNORMAL HIGH (ref 0.35–4.50)

## 2018-08-23 NOTE — Telephone Encounter (Signed)
Called and spoke to pt. Pt states the Symbicort is too expensive for her. Advised pt to contact her insurance and ask what the covered alternatives are. Pt is very interested in Trelegy and states she will ask if it is covered and at what cost. Pt states she will call back with covered alternatives.

## 2018-08-24 NOTE — Telephone Encounter (Signed)
Signed accidentally reopened,  ATC patient unable to reach message left to call back

## 2018-08-26 ENCOUNTER — Ambulatory Visit (INDEPENDENT_AMBULATORY_CARE_PROVIDER_SITE_OTHER): Payer: Medicare Other | Admitting: Family Medicine

## 2018-08-26 ENCOUNTER — Telehealth: Payer: Self-pay | Admitting: Family Medicine

## 2018-08-26 ENCOUNTER — Other Ambulatory Visit: Payer: Self-pay

## 2018-08-26 ENCOUNTER — Encounter: Payer: Self-pay | Admitting: Family Medicine

## 2018-08-26 VITALS — BP 110/84 | HR 82 | Temp 98.1°F | Ht 66.5 in | Wt 177.0 lb

## 2018-08-26 DIAGNOSIS — Z23 Encounter for immunization: Secondary | ICD-10-CM

## 2018-08-26 DIAGNOSIS — R000 Tachycardia, unspecified: Secondary | ICD-10-CM

## 2018-08-26 DIAGNOSIS — J449 Chronic obstructive pulmonary disease, unspecified: Secondary | ICD-10-CM | POA: Diagnosis not present

## 2018-08-26 DIAGNOSIS — F341 Dysthymic disorder: Secondary | ICD-10-CM | POA: Diagnosis not present

## 2018-08-26 DIAGNOSIS — Z0000 Encounter for general adult medical examination without abnormal findings: Secondary | ICD-10-CM | POA: Diagnosis not present

## 2018-08-26 DIAGNOSIS — E039 Hypothyroidism, unspecified: Secondary | ICD-10-CM

## 2018-08-26 DIAGNOSIS — M8580 Other specified disorders of bone density and structure, unspecified site: Secondary | ICD-10-CM

## 2018-08-26 DIAGNOSIS — Z7189 Other specified counseling: Secondary | ICD-10-CM

## 2018-08-26 MED ORDER — METOPROLOL SUCCINATE ER 50 MG PO TB24: 50.0000 mg | ORAL_TABLET | Freq: Every day | ORAL | 3 refills | Status: DC

## 2018-08-26 MED ORDER — LEVOTHYROXINE SODIUM 125 MCG PO TABS: 125.0000 ug | ORAL_TABLET | Freq: Every day | ORAL | Status: DC

## 2018-08-26 MED ORDER — ALPRAZOLAM 0.5 MG PO TABS: 0.2500 mg | ORAL_TABLET | Freq: Two times a day (BID) | ORAL | 1 refills | Status: DC | PRN

## 2018-08-26 NOTE — Telephone Encounter (Signed)
error 

## 2018-08-26 NOTE — Telephone Encounter (Signed)
Attempted to call patient, no answer, left message to call back.  

## 2018-08-26 NOTE — Progress Notes (Signed)
I have personally reviewed the Medicare Annual Wellness questionnaire and have noted 1. The patient's medical and social history 2. Their use of alcohol, tobacco or illicit drugs 3. Their current medications and supplements 4. The patient's functional ability including ADL's, fall risks, home safety risks and hearing or visual             impairment. 5. Diet and physical activities 6. Evidence for depression or mood disorders  The patients weight, height, BMI have been recorded in the chart and visual acuity is per eye clinic.  I have made referrals, counseling and provided education to the patient based review of the above and I have provided the pt with a written personalized care plan for preventive services.  Provider list updated- see scanned forms.  Routine anticipatory guidance given to patient.  See health maintenance. The possibility exists that previously documented standard health maintenance information may have been brought forward from a previous encounter into this note.  If needed, that same information has been updated to reflect the current situation based on today's encounter.    Flu 2020 Shingles discussed with patient. PNA out of stock Tetanus 2013 Colon cancer screening done colonoscopy 2018. Breast cancer screening 2020 Bone density test follow-up pending. Advance directive-  [name] son Gery Pray designated if patient were incapacitated.  Cognitive function addressed- see scanned forms- and if abnormal then additional documentation follows.   EKG prev done, in EMR.  Passed whisper hearing test.  Vision screening done at [place].   Mood d/w pt.  Inc stressors noted.  Still with sig anxiety with higher stress this year.  Off BZD for a few months.  No SI/HI, but has been tearful.    She is safe at home.  D/w pt.    She has f/u CT pending per pulmonary and I'll defer, she agrees.    Advair was changed to symbicort.  She is on spiriva.  She is checking on options  for pricing.  D/w pt.  Her breathing is clearly better on meds.  She is also working on breathing exercises.    Hypothyroidism.  TSH elevated.  Some fatigue noted.    H/o elevated pulse, >100.  Has been on metoprolol 25mg . She can feel inc pulse rate occ, usually midday if it is going to happen.  No caffeine.  She has SOB with walking stairs but not chest pain.  Stress test done 2019: Pharmacological myocardial perfusion imaging study with no significant  ischemia Normal wall motion, EF estimated at 58% No EKG changes concerning for ischemia at peak stress or in recovery. Low risk scan  PMH and SH reviewed  Meds, vitals, and allergies reviewed.   ROS: Per HPI.  Unless specifically indicated otherwise in HPI, the patient denies:  General: fever. Eyes: acute vision changes ENT: sore throat Cardiovascular: chest pain Respiratory: SOB GI: vomiting GU: dysuria Musculoskeletal: acute back pain Derm: acute rash Neuro: acute motor dysfunction Psych: worsening mood Endocrine: polydipsia Heme: bleeding Allergy: hayfever  GEN: nad, alert and oriented HEENT: ncat NECK: supple w/o LA CV: rrr. PULM: ctab, no inc wob ABD: soft, +bs EXT: no edema SKIN: no acute rash  Health Maintenance  Topic Date Due  . PNA vac Low Risk Adult (1 of 2 - PCV13) 03/13/2018  . COLONOSCOPY  10/15/2019  . MAMMOGRAM  08/16/2020  . TETANUS/TDAP  04/05/2021  . INFLUENZA VACCINE  Completed  . DEXA SCAN  Completed  . Hepatitis C Screening  Completed  . HIV Screening  Completed

## 2018-08-26 NOTE — Patient Instructions (Addendum)
Out of stock on the pneumonia shot.  Check with your insurance to see if they will cover the shingrix shot. Flu shot today.    Recheck TSH in about 2 months at nonfasting labs visit.  Increase thyroid medicine to 1 tab a day except 2 tabs on Sundays.  8 pills a week.  We'll call about the bone density test.   If your mood isn't better with restarting xanax, then let me know.   Increase metoprolol to 50mg  a day and let me know if that isn't helping.   Take care.  Glad to see you.

## 2018-08-29 DIAGNOSIS — Z0000 Encounter for general adult medical examination without abnormal findings: Secondary | ICD-10-CM | POA: Insufficient documentation

## 2018-08-29 NOTE — Assessment & Plan Note (Signed)
Inc stressors noted.  Still with sig anxiety with higher stress this year.  Off BZD for a few months.  No SI/HI, but has been tearful.   She is going to restart Xanax and update me if not better.  She agrees.

## 2018-08-29 NOTE — Assessment & Plan Note (Signed)
  Hypothyroidism.  TSH elevated.  Some fatigue noted.   Discussed with patient about increased replacement and recheck labs in about 2-3 months.  See after visit summary.

## 2018-08-29 NOTE — Telephone Encounter (Signed)
Called and spoke to patient. Patient stated she called her insurance company and asked how much Trelegy would be and that it would be over $500 which is more than she is spending now on the Symbicort and Spiriva.  I asked patient if she asked for a list of covered alternatives and she reported she did not. Patient stated she will call the insurance company tomorrow and ask for a list of covered alternatives.  Let patient know that we could also give her patient assistance forms if needed.  Patient verbalized understanding and reports she will call back after she has the list from the insurance company.

## 2018-08-29 NOTE — Assessment & Plan Note (Signed)
Flu 2020 Shingles discussed with patient. PNA out of stock Tetanus 2013 Colon cancer screening done colonoscopy 2018. Breast cancer screening 2020 Bone density test follow-up pending. Advance directive-  April [name] son April [name] designated if patient were incapacitated.  Cognitive function addressed- see scanned forms- and if abnormal then additional documentation follows.

## 2018-08-29 NOTE — Assessment & Plan Note (Signed)
Advance directive-  Foster son April [name] designated if patient were incapacitated.  

## 2018-08-29 NOTE — Assessment & Plan Note (Signed)
Discussed with patient about options.  Still okay for outpatient follow-up.  She will increase metoprolol to 50mg  a day and let me know if that isn't helping.

## 2018-08-29 NOTE — Assessment & Plan Note (Signed)
Advair was changed to symbicort.  She is on spiriva.  She is checking on options for pricing.  D/w pt.  Her breathing is clearly better on meds.  She is also working on breathing exercises.

## 2018-08-31 ENCOUNTER — Telehealth: Payer: Self-pay | Admitting: Pulmonary Disease

## 2018-08-31 NOTE — Telephone Encounter (Signed)
Returned call to patient she called her insurance re inhalers: Tier 3 drugs. Spiriva $2300 Symbicort [id number].  Pt will have 3 mos f/u in Oct. She will try to get more detail re other inhalers within the tier and there cost. She will go into donut hole before end of year. Nothing further needed.

## 2018-09-13 ENCOUNTER — Other Ambulatory Visit: Payer: Self-pay | Admitting: *Deleted

## 2018-09-13 NOTE — Telephone Encounter (Signed)
Faxed refill request. Alprazolam Last office visit:   08/26/2018 Last Filled:    60 tablet 1 08/26/2018  Please advise.

## 2018-09-14 NOTE — Telephone Encounter (Signed)
Please verify that she wanted the sent to mail order and was going to discontinue local refills.  Let me know.  Thanks.

## 2018-09-15 NOTE — Telephone Encounter (Signed)
Patient does want the Rx from mail order and not locally.

## 2018-09-16 MED ORDER — ALPRAZOLAM 0.5 MG PO TABS: 0.2500 mg | ORAL_TABLET | Freq: Two times a day (BID) | ORAL | 2 refills | Status: DC | PRN

## 2018-09-16 NOTE — Telephone Encounter (Signed)
Sent. Thanks.   

## 2018-10-05 ENCOUNTER — Ambulatory Visit
Admission: RE | Admit: 2018-10-05 | Discharge: 2018-10-05 | Disposition: A | Discharge status: Home / Self Care | Payer: Medicare Other | Source: Ambulatory Visit | Attending: Pulmonary Disease | Admitting: Pulmonary Disease

## 2018-10-05 DIAGNOSIS — R911 Solitary pulmonary nodule: Secondary | ICD-10-CM

## 2018-10-05 DIAGNOSIS — R918 Other nonspecific abnormal finding of lung field: Secondary | ICD-10-CM | POA: Diagnosis not present

## 2018-10-24 ENCOUNTER — Telehealth: Payer: Self-pay

## 2018-10-24 DIAGNOSIS — Z87891 Personal history of nicotine dependence: Secondary | ICD-10-CM | POA: Diagnosis not present

## 2018-10-24 DIAGNOSIS — Z7289 Other problems related to lifestyle: Secondary | ICD-10-CM | POA: Diagnosis not present

## 2018-10-24 DIAGNOSIS — K219 Gastro-esophageal reflux disease without esophagitis: Secondary | ICD-10-CM | POA: Diagnosis not present

## 2018-10-24 DIAGNOSIS — J343 Hypertrophy of nasal turbinates: Secondary | ICD-10-CM | POA: Diagnosis not present

## 2018-10-24 DIAGNOSIS — R490 Dysphonia: Secondary | ICD-10-CM | POA: Diagnosis not present

## 2018-10-24 NOTE — Telephone Encounter (Signed)
LVM w COVID screen, back lab and front door info

## 2018-10-26 ENCOUNTER — Ambulatory Visit (INDEPENDENT_AMBULATORY_CARE_PROVIDER_SITE_OTHER): Payer: Medicare Other | Admitting: *Deleted

## 2018-10-26 ENCOUNTER — Other Ambulatory Visit (INDEPENDENT_AMBULATORY_CARE_PROVIDER_SITE_OTHER): Payer: Medicare Other

## 2018-10-26 DIAGNOSIS — Z23 Encounter for immunization: Secondary | ICD-10-CM | POA: Diagnosis not present

## 2018-10-26 DIAGNOSIS — E039 Hypothyroidism, unspecified: Secondary | ICD-10-CM | POA: Diagnosis not present

## 2018-10-26 LAB — TSH: TSH: 0.51 u[IU]/mL (ref 0.35–4.50)

## 2018-10-28 ENCOUNTER — Encounter: Payer: Self-pay | Admitting: Family Medicine

## 2018-10-30 ENCOUNTER — Telehealth: Payer: Self-pay | Admitting: Family Medicine

## 2018-10-30 NOTE — Telephone Encounter (Signed)
See my chart message from patient listed below.  Please triage patient.  Thanks.  [name]  You 2 days ago     Hi Dr. Damita Dunnings.  I've been having dizziness again with mild headaches and stumbling to the left.  My outside left foot turns inwards part of the time. There are times I don't drive, because of the dizziness . I'm not sure if I need to see a neurologist or to see you again.  ???  If a referral is needed please schedule an appointment for me.  Thank you.  April [name]

## 2018-10-31 ENCOUNTER — Encounter: Payer: Self-pay | Admitting: Family Medicine

## 2018-10-31 ENCOUNTER — Other Ambulatory Visit: Payer: Self-pay

## 2018-10-31 ENCOUNTER — Ambulatory Visit (INDEPENDENT_AMBULATORY_CARE_PROVIDER_SITE_OTHER): Payer: Medicare Other | Admitting: Family Medicine

## 2018-10-31 VITALS — BP 116/78 | HR 84 | Temp 97.2°F | Ht 66.5 in | Wt 176.4 lb

## 2018-10-31 DIAGNOSIS — I10 Essential (primary) hypertension: Secondary | ICD-10-CM | POA: Diagnosis not present

## 2018-10-31 DIAGNOSIS — H8110 Benign paroxysmal vertigo, unspecified ear: Secondary | ICD-10-CM

## 2018-10-31 MED ORDER — METOPROLOL SUCCINATE ER 50 MG PO TB24: ORAL_TABLET | ORAL | Status: DC

## 2018-10-31 MED ORDER — MECLIZINE HCL 25 MG PO TABS: 12.5000 mg | ORAL_TABLET | Freq: Three times a day (TID) | ORAL | 1 refills | Status: DC | PRN

## 2018-10-31 MED ORDER — MECLIZINE HCL 25 MG PO TABS: 12.5000 mg | ORAL_TABLET | Freq: Three times a day (TID) | ORAL | Status: DC | PRN

## 2018-10-31 NOTE — Patient Instructions (Signed)
Cut the metoprolol in half.  Monitor your BP and pulse and update me about the lightheadedness in a few days.   Take meclizine and work on the bedside exercises for BPV.   Take care.  Glad to see you.

## 2018-10-31 NOTE — Telephone Encounter (Signed)
See OV note.  Thanks.  

## 2018-10-31 NOTE — Progress Notes (Signed)
She is 1 year out from smoking cessation.  I thank you for your effort.  Sx for the last few weeks.  Noted episodic sx.  "I feel like I'm drunk when I'm walking" in spite of sobriety.  She feel lightheaded w/o syncope.  No vertigo sx with these episodes.  Better with sitting down.    She has sx with rolling over in the bed, rolling from L to right, with room spinning, but this is different from the symptoms described above with walking.    Meds, vitals, and allergies reviewed.   ROS: Per HPI unless specifically indicated in ROS section   GEN: nad, alert and oriented HEENT: mucous membranes moist, TM wnl NECK: supple w/o LA CV: rrr.  PULM: ctab, no inc wob ABD: soft, +bs EXT: no edema SKIN: Well-perfused CN 2-12 wnl B, S/S/DTR wnl x4 Reproducible symptoms with + nystagmus on DHP looking back to the right.

## 2018-10-31 NOTE — Telephone Encounter (Signed)
I spoke with April [name]; April [name] is having dizziness off and on for few weeks; none now. Room does not spin around, this dizziness is more lightheaded. April [name] having mild h/a that comes and goes.when has dizzy episodes April [name] stumbles to the left and left foot turns in. No weakness in arms or legs. April [name] having symptoms on and off for 4 wks; April [name] has no driven in 2 wks due to dizziness.April [name] has COPD but SOB is no worse than usual.BP last wk 135/85 and April [name] cannot find BP cuff now. April [name] has been hoarse and saw specialist last wk.April [name] has covid symptoms of mild h/a, no travel and no known exposure to + covid. April [name] scheduled in office appt with Dr Damita Dunnings today to see if needs neurology referral.

## 2018-11-01 ENCOUNTER — Ambulatory Visit: Payer: Medicare Other | Admitting: Family Medicine

## 2018-11-02 NOTE — Assessment & Plan Note (Signed)
She likely has BPV and is also slightly lightheaded, so she likely has 2 separate issues going on.  Reasonable for her to use meclizine as needed and home bedside exercises for BPV.  Discussed with patient.  She agrees.  Also reasonable to decrease her metoprolol in the meantime to help with her symptoms that are likely orthostatic and separate from vertigo.  Rationale discussed with patient.  She will update me as needed.  See after visit summary.  She agrees with plan.  Okay for outpatient follow-up.  No need for imaging at this point.

## 2018-11-14 ENCOUNTER — Ambulatory Visit
Admission: RE | Admit: 2018-11-14 | Discharge: 2018-11-14 | Disposition: A | Discharge status: Home / Self Care | Payer: Medicare Other | Source: Ambulatory Visit | Attending: Family Medicine | Admitting: Family Medicine

## 2018-11-14 ENCOUNTER — Other Ambulatory Visit: Payer: Self-pay

## 2018-11-14 DIAGNOSIS — M8589 Other specified disorders of bone density and structure, multiple sites: Secondary | ICD-10-CM | POA: Diagnosis not present

## 2018-11-14 DIAGNOSIS — M8580 Other specified disorders of bone density and structure, unspecified site: Secondary | ICD-10-CM

## 2018-11-14 DIAGNOSIS — Z780 Asymptomatic menopausal state: Secondary | ICD-10-CM | POA: Diagnosis not present

## 2018-11-25 NOTE — Progress Notes (Signed)
Virtual Visit via Telephone Note  I connected with April [name] on 12/09/18 at 10:15 AM EST by telephone and verified that I am speaking with the correct person using two identifiers.  Location: Patient: Home  Provider: Clinic  This service was conducted via virtual visit.  Both audio and visual tools were used.  The patient was located at home. I was located in my office.  Consent was obtained prior to the virtual visit and is aware of possible charges through their insurance for this visit.  The patient is an established patient.  Dr. Estanislado Pandy, MD conducted the virtual visit and Hazel Sams, PA-C acted as scribe during the service.  Office staff helped with scheduling follow up visits after the service was conducted.   I discussed the limitations, risks, security and privacy concerns of performing an evaluation and management service by telephone and the availability of in person appointments. I also discussed with the patient that there may be a patient responsible charge related to this service. The patient expressed understanding and agreed to proceed.  CC: routine follow up  History of Present Illness: Patient is a 63 year old female with a past medical history of temporal arteritis, PMR, and DDD.  Her PMR remains in remission. She has not had any recurrences of temporal arteritis or PMR recently.  She denies any muscle aches or muscle tenderness.  She states bilateral hip replacements are doing well.  She has chronic pain and stiffness.   Review of Systems  Constitutional: Negative for fever and malaise/fatigue.  Eyes: Negative for photophobia, pain, discharge and redness.  Respiratory: Negative for cough, shortness of breath and wheezing.   Cardiovascular: Negative for chest pain and palpitations.  Gastrointestinal: Negative for blood in stool, constipation and diarrhea.  Genitourinary: Negative for dysuria.  Musculoskeletal: Negative for back pain, joint pain, myalgias  and neck pain.  Skin: Negative for rash.  Neurological: Negative for dizziness and headaches.  Psychiatric/Behavioral: Negative for depression. The patient is not nervous/anxious and does not have insomnia.       Observations/Objective: Physical Exam  Constitutional: She is oriented to person, place, and time.  Neurological: She is alert and oriented to person, place, and time.  Psychiatric: Mood, memory, affect and judgment normal.   Patient reports morning stiffness for 0 minute.   Patient denies nocturnal pain.  Difficulty dressing/grooming: Denies Difficulty climbing stairs: Denies Difficulty getting out of chair: Reports Difficulty using hands for taps, buttons, cutlery, and/or writing: Denies   Assessment and Plan: Visit Diagnoses: Temporal arteritis ([place]): She has not had any recurrence recently.  She has not had any temporal artery tenderness or headaches recently.  She is not on prednisone. She was advised to notify us if she develops signs or symptoms of a flare.   Polymyalgia rheumatica ([facility]) - In remission. She has not had any signs or symptoms of a flare. She has no muscle aches or muscle tenderness at this time. She is not taking prednisone or any DMARDs.  She was advised to notify us if she develops symptoms of a flare.  Osteopenia of multiple sites -  DEXA 08/05/16. T -2.4. Updated DEXA on 11/14/18: The BMD measured at [facility] L1-L4 is 0.937 g/cm2 with a T-score of -2.1. She was taken off of Boniva.  She takes a calcium and vitamin D supplement daily.    History of vitamin D deficiency: She is taking a vitamin D supplement.   Neck pain: She experiences muscle tension.  Massage  was discussed.   DDD (degenerative disc disease), lumbar: Chronic pain.  She takes Tylenol and zanaflex 4 mg po as needed for pain relief.  History of total hip replacement, bilateral: Doing well.  She has no discomfort at this time.    Other medical conditions are listed as follows:   Smoker  History of COPD  History of hypothyroidism  History of asthma  History of depression  History of anxiety  Follow Up Instructions: She will follow up in    I discussed the assessment and treatment plan with the patient. The patient was provided an opportunity to ask questions and all were answered. The patient agreed with the plan and demonstrated an understanding of the instructions.   The patient was advised to call back or seek an in-person evaluation if the symptoms worsen or if the condition fails to improve as anticipated.  I provided 15 minutes of non-face-to-face time during this encounter.   Bo Merino, MD   Scribed by-  Hazel Sams, PA-C

## 2018-12-02 ENCOUNTER — Other Ambulatory Visit (HOSPITAL_COMMUNITY)
Admission: RE | Admit: 2018-12-02 | Discharge: 2018-12-02 | Disposition: A | Discharge status: Home / Self Care | Payer: Medicare Other | Source: Ambulatory Visit | Attending: Pulmonary Disease | Admitting: Pulmonary Disease

## 2018-12-02 DIAGNOSIS — Z01812 Encounter for preprocedural laboratory examination: Secondary | ICD-10-CM | POA: Diagnosis not present

## 2018-12-02 DIAGNOSIS — Z20828 Contact with and (suspected) exposure to other viral communicable diseases: Secondary | ICD-10-CM | POA: Diagnosis not present

## 2018-12-02 LAB — SARS CORONAVIRUS 2 (TAT 6-24 HRS): SARS Coronavirus 2: NEGATIVE

## 2018-12-07 ENCOUNTER — Ambulatory Visit (INDEPENDENT_AMBULATORY_CARE_PROVIDER_SITE_OTHER): Payer: Medicare Other | Admitting: Pulmonary Disease

## 2018-12-07 ENCOUNTER — Encounter: Payer: Self-pay | Admitting: Pulmonary Disease

## 2018-12-07 ENCOUNTER — Other Ambulatory Visit: Payer: Self-pay

## 2018-12-07 VITALS — BP 132/80 | HR 82 | Temp 97.4°F | Ht 67.0 in | Wt 181.0 lb

## 2018-12-07 DIAGNOSIS — Z87891 Personal history of nicotine dependence: Secondary | ICD-10-CM

## 2018-12-07 DIAGNOSIS — R918 Other nonspecific abnormal finding of lung field: Secondary | ICD-10-CM | POA: Diagnosis not present

## 2018-12-07 DIAGNOSIS — J432 Centrilobular emphysema: Secondary | ICD-10-CM

## 2018-12-07 DIAGNOSIS — I5022 Chronic systolic (congestive) heart failure: Secondary | ICD-10-CM

## 2018-12-07 DIAGNOSIS — J449 Chronic obstructive pulmonary disease, unspecified: Secondary | ICD-10-CM | POA: Diagnosis not present

## 2018-12-07 DIAGNOSIS — R911 Solitary pulmonary nodule: Secondary | ICD-10-CM

## 2018-12-07 LAB — PULMONARY FUNCTION TEST
DL/VA % pred: 73 %
DL/VA: 3 ml/min/mmHg/L
DLCO unc % pred: 49 %
DLCO unc: 10.76 ml/min/mmHg
FEF 25-75 Post: 0.48 L/sec
FEF 25-75 Pre: 0.31 L/sec
FEF2575-%Change-Post: 52 %
FEF2575-%Pred-Post: 20 %
FEF2575-%Pred-Pre: 13 %
FEV1-%Change-Post: 16 %
FEV1-%Pred-Post: 34 %
FEV1-%Pred-Pre: 29 %
FEV1-Post: 0.93 L
FEV1-Pre: 0.8 L
FEV1FVC-%Change-Post: 1 %
FEV1FVC-%Pred-Pre: 59 %
FEV6-%Change-Post: 16 %
FEV6-%Pred-Post: 55 %
FEV6-%Pred-Pre: 47 %
FEV6-Post: 1.89 L
FEV6-Pre: 1.62 L
FEV6FVC-%Change-Post: 1 %
FEV6FVC-%Pred-Post: 98 %
FEV6FVC-%Pred-Pre: 97 %
FVC-%Change-Post: 14 %
FVC-%Pred-Post: 56 %
FVC-%Pred-Pre: 49 %
FVC-Post: 1.99 L
FVC-Pre: 1.73 L
Post FEV1/FVC ratio: 47 %
Post FEV6/FVC ratio: 95 %
Pre FEV1/FVC ratio: 46 %
Pre FEV6/FVC Ratio: 94 %
RV % pred: 222 %
RV: 4.99 L
TLC % pred: 123 %
TLC: 6.8 L

## 2018-12-07 MED ORDER — BREZTRI AEROSPHERE 160-9-4.8 MCG/ACT IN AERO: 2.0000 | INHALATION_SPRAY | Freq: Two times a day (BID) | RESPIRATORY_TRACT | 1 refills | Status: DC

## 2018-12-07 MED ORDER — ALBUTEROL SULFATE HFA 108 (90 BASE) MCG/ACT IN AERS: 2.0000 | INHALATION_SPRAY | Freq: Four times a day (QID) | RESPIRATORY_TRACT | 3 refills | Status: DC | PRN

## 2018-12-07 MED ORDER — BREZTRI AEROSPHERE 160-9-4.8 MCG/ACT IN AERO: 2.0000 | INHALATION_SPRAY | Freq: Two times a day (BID) | RESPIRATORY_TRACT | 0 refills | Status: DC

## 2018-12-07 NOTE — Progress Notes (Signed)
PFT done today. 

## 2018-12-07 NOTE — Patient Instructions (Signed)
Thank you for visiting Dr. Valeta Harms at [facility]. Today we recommend the following:  Orders Placed This Encounter  Procedures  . CT CHEST WO CONTRAST  . Ambulatory referral to Cardiology   Start Breztri samples. We will send a script too.  CT Chest scheduled for September 2021  Return in about 3 months (around 03/07/2019) for with APP or Dr. Valeta Harms.    Please do your part to reduce the spread of COVID-19.
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
## 2018-12-07 NOTE — Addendum Note (Signed)
Addended by: Valerie Salts on: 12/07/2018 02:49 PM   Modules accepted: Orders

## 2018-12-09 ENCOUNTER — Telehealth (INDEPENDENT_AMBULATORY_CARE_PROVIDER_SITE_OTHER): Payer: Medicare Other | Admitting: Rheumatology

## 2018-12-09 ENCOUNTER — Encounter: Payer: Self-pay | Admitting: Rheumatology

## 2018-12-09 ENCOUNTER — Other Ambulatory Visit: Payer: Self-pay

## 2018-12-09 DIAGNOSIS — M8589 Other specified disorders of bone density and structure, multiple sites: Secondary | ICD-10-CM

## 2018-12-09 DIAGNOSIS — Z8709 Personal history of other diseases of the respiratory system: Secondary | ICD-10-CM

## 2018-12-09 DIAGNOSIS — Z8659 Personal history of other mental and behavioral disorders: Secondary | ICD-10-CM

## 2018-12-09 DIAGNOSIS — M5136 Other intervertebral disc degeneration, lumbar region: Secondary | ICD-10-CM

## 2018-12-09 DIAGNOSIS — M353 Polymyalgia rheumatica: Secondary | ICD-10-CM

## 2018-12-09 DIAGNOSIS — Z96643 Presence of artificial hip joint, bilateral: Secondary | ICD-10-CM

## 2018-12-09 DIAGNOSIS — F17200 Nicotine dependence, unspecified, uncomplicated: Secondary | ICD-10-CM

## 2018-12-09 DIAGNOSIS — Z8639 Personal history of other endocrine, nutritional and metabolic disease: Secondary | ICD-10-CM

## 2018-12-09 DIAGNOSIS — M542 Cervicalgia: Secondary | ICD-10-CM

## 2018-12-09 DIAGNOSIS — M316 Other giant cell arteritis: Secondary | ICD-10-CM

## 2018-12-23 ENCOUNTER — Telehealth: Payer: Self-pay | Admitting: Rheumatology

## 2018-12-23 NOTE — Telephone Encounter (Signed)
I LMOM for patient to call, and schedule a follow up appt around 06/08/2019.

## 2018-12-23 NOTE — Telephone Encounter (Signed)
-----   Message from Shona Needles, RT sent at 12/13/2018 11:08 AM EST ----- Regarding: 6 MONTH F/U

## 2018-12-26 ENCOUNTER — Telehealth: Payer: Self-pay | Admitting: Pulmonary Disease

## 2018-12-26 NOTE — Telephone Encounter (Signed)
ATC pt, no answer. Left message for pt to call back.  

## 2018-12-27 ENCOUNTER — Ambulatory Visit: Payer: Medicare Other | Admitting: Cardiology

## 2018-12-27 NOTE — Telephone Encounter (Signed)
Called and spoke with pt letting her know that PA has been initiated that it could take up to 72 hours to receive determination. Stated to pt once we have response we would call her to let her know what it was and pt verbalized understanding.  Medication name and strength: Breztri Inhaler Provider: [place]: [facility] Patient insurance ID: [id number] Phone: [phone] Fax: [phone]  Was the PA started on CMM?  yes If yes, please enter the Key: [id number] Timeframe for approval/denial: [facility] is reviewing your PA request. Typically an electronic response will be received within 72 hours. To check for an update later, open this request from your dashboard.

## 2018-12-29 NOTE — Telephone Encounter (Signed)
Checked covermymeds and received this result:  This request has received a Favorable outcome.  Please note any additional information provided by [facility] at the bottom of your screen.  You will also receive a faxed copy of the determination.   Called and spoke with pt letting her know the above results and she verbalized understanding.nothing further needed.

## 2019-01-03 ENCOUNTER — Other Ambulatory Visit: Payer: Self-pay | Admitting: Family Medicine

## 2019-01-18 ENCOUNTER — Encounter: Payer: Self-pay | Admitting: Cardiology

## 2019-01-18 ENCOUNTER — Other Ambulatory Visit: Payer: Self-pay

## 2019-01-18 ENCOUNTER — Ambulatory Visit: Payer: Medicare Other | Admitting: Cardiology

## 2019-01-18 VITALS — BP 155/93 | HR 90 | Temp 97.3°F | Ht 66.0 in | Wt 183.0 lb

## 2019-01-18 DIAGNOSIS — Z8249 Family history of ischemic heart disease and other diseases of the circulatory system: Secondary | ICD-10-CM

## 2019-01-18 DIAGNOSIS — R931 Abnormal findings on diagnostic imaging of heart and coronary circulation: Secondary | ICD-10-CM

## 2019-01-18 DIAGNOSIS — I700 Atherosclerosis of aorta: Secondary | ICD-10-CM

## 2019-01-18 DIAGNOSIS — Z7189 Other specified counseling: Secondary | ICD-10-CM | POA: Diagnosis not present

## 2019-01-18 DIAGNOSIS — I428 Other cardiomyopathies: Secondary | ICD-10-CM

## 2019-01-18 DIAGNOSIS — Z79899 Other long term (current) drug therapy: Secondary | ICD-10-CM

## 2019-01-18 DIAGNOSIS — R030 Elevated blood-pressure reading, without diagnosis of hypertension: Secondary | ICD-10-CM

## 2019-01-18 MED ORDER — VALSARTAN 40 MG PO TABS: 40.0000 mg | ORAL_TABLET | Freq: Every day | ORAL | 11 refills | Status: DC

## 2019-01-18 NOTE — Progress Notes (Signed)
Cardiology Office Note:    Date:  01/18/2019   ID:  April [name], DOB 09-16-1955, MRN [id number]  PCP:  [name], MD  Cardiologist:  Buford Dresser, MD (prior Dr. Rockey Situ 2019)  Referring MD: Garner Nash, DO   CC: establish care with cardiology, abnormal echo  History of Present Illness:    April [name] is a 64 y.o. female with a hx of COPD gold stage 3, pulmonary nodules, PMR, temporal arteritis who is seen as a new consult at the request of Icard, Octavio Graves, DO for the evaluation and management of abnormal echo, need to establish care with cardiology.  Most recent note from Dr. Valeta Harms, dated 12/07/18, reviewed today. Echo noted to have reduced ejection fraction (see below). Has not seen cardiology in the past, referred for further evaluation.  Cardiovascular risk factors: Prior clinical ASCVD: none that she knows of Comorbid conditions: Denies hypertension, hyperlipidemia, diabetes, chronic kidney disease. Has history of tachycardia Metabolic syndrome/Obesity: BMI 29 Chronic inflammatory conditions: PMR/temporal arteritis Tobacco use history: quit 1 year, congratulated. Family history: mother had bypass surgery at age 56 (MI prior), kidney disease, colon cancer. Deceased aged 7. Father died of an accident at age 58, had COPD prior. MGF with stroke, died age 92. No other CVD that she knows of, possibly one other aunt/uncle with a stroke. Prior cardiac testing and/or incidental findings on other testing (ie coronary calcium): aortic calcifications seen on noncontrast ct. Cardiac cath 2006 by Dr. Gwenlyn Found, no CAD. Stress test 09/08/2017 without ischemia. Exercise level: walks around without issue without significant intentional exercise. Limited by dizzy spells/vertigo making her feel unsteady.  Current diet: has gained ~20 lbs since the pandemic. Has not eaten well while at home.  Denies PND, orthopnea, LE edema. No syncope. Rare palpitations, improved  on metoprolol.  Reports rare chest pain. Mild, light pain in the center of her chest. No clear triggers, though it is more common after she eats, sometimes at night as well. Rarely with exertion.   Has chronic shortness of breath, follows with Dr. Valeta Harms in pulmonology.   Has been told she has borderline BP but has not been on meds in many years. Checks BP at home, always <140/85. HR usually well controlled as well. Anxious at visit today, has never seen numbers be this high.  Past Medical History:  Diagnosis Date  . Anxiety   . Anxiety and depression    related to caring for mother during terminal illness  . Arthritis   . Asthma   . AVN (avascular necrosis of bone) (HCC)    hip and wrist  . Bronchitis    hx of  . COPD (chronic obstructive pulmonary disease) ([place])   . Depression   . Endometriosis   . FH: CAD (coronary artery disease)   . History of palpitations   . Hypothyroid   . Osteopenia    forteo through Dr. Estanislado Pandy (started 10/12)  . PMR (polymyalgia rheumatica) (HCC)   . SIRS (systemic inflammatory response syndrome) ([place]) 10/2017  . Smoker   . SOB (shortness of breath) on exertion   . Squamous cell carcinoma    facial, 2016  . Temporal arteritis (HCC)    s/p prednisone taper    Past Surgical History:  Procedure Laterality Date  . ABDOMINAL ADHESION SURGERY    . ABDOMINAL HYSTERECTOMY    . BREAST EXCISIONAL BIOPSY    . BREAST SURGERY Left    benign bx 1990  . CARDIAC CATHETERIZATION  2007  no PCI  . CHOLECYSTECTOMY    . COLONOSCOPY W/ POLYPECTOMY    . INCONTINENCE SURGERY     2007   . JOINT REPLACEMENT Left 2012   left hip  . TONSILLECTOMY    . TOTAL HIP ARTHROPLASTY     L hip 2012  . TOTAL HIP ARTHROPLASTY Right 10/04/2012   Procedure: TOTAL HIP ARTHROPLASTY;  Surgeon: Garald Balding, MD;  Location: [facility];  Service: Orthopedics;  Laterality: Right;  . WRIST SURGERY     2012/ left wrist/ bone removed due to necrosis    Current  Medications: Current Outpatient Medications on File Prior to Visit  Medication Sig  . acetaminophen (TYLENOL) 325 MG tablet Take 2 tablets (650 mg total) by mouth every 6 (six) hours as needed.  Marland Kitchen albuterol (VENTOLIN HFA) 108 (90 Base) MCG/ACT inhaler Inhale 2 puffs into the lungs every 6 (six) hours as needed for wheezing or shortness of breath. Okay to dispense proair/Ventolin/albuterol.  . ALPRAZolam (XANAX) 0.5 MG tablet Take 0.5-1 tablets (0.25-0.5 mg total) by mouth 2 (two) times daily as needed for anxiety.  Marland Kitchen Bioflavonoid Products (VITAMIN C) CHEW Chew 1 tablet by mouth 2 (two) times daily.   Renard Hamper Pepper-Turmeric (TURMERIC CURCUMIN) 05-998 MG CAPS Take by mouth.  . Budeson-Glycopyrrol-Formoterol (BREZTRI AEROSPHERE) 160-9-4.8 MCG/ACT AERO Inhale 2 puffs into the lungs 2 (two) times daily.  Marland Kitchen buPROPion (WELLBUTRIN XL) 300 MG 24 hr tablet TAKE 1 TABLET BY MOUTH  DAILY  . Calcium 500 MG CHEW Chew 1 tablet by mouth 2 (two) times daily.   . Cholecalciferol (VITAMIN D3) 250 MCG (10000 UT) capsule Take 10,000 Units by mouth daily.  Marland Kitchen levothyroxine (SYNTHROID) 125 MCG tablet TAKE 1 TABLET BY MOUTH  DAILY EXCEPT TAKE 1 AND 1/2 TABLETS ON SUNDAY  . meclizine (ANTIVERT) 25 MG tablet Take 0.5-1 tablets (12.5-25 mg total) by mouth 3 (three) times daily as needed for dizziness.  . metoprolol succinate (TOPROL-XL) 50 MG 24 hr tablet Take 1/2 tab daily.  . Multiple Vitamin (MULTIVITAMIN) tablet Take 1 tablet by mouth daily.  Vladimir Faster Glycol-Propyl Glycol (SYSTANE OP) Apply 1 drop to eye daily as needed (dryness).  . Potassium 99 MG TABS Take by mouth.  Marland Kitchen tiZANidine (ZANAFLEX) 4 MG tablet TAKE 1/2 TO 1 TABLET BY MOUTH EVERY 6 HOURS AS NEEDED FOR MUSCLE SPASMS   No current facility-administered medications on file prior to visit.     Allergies:   Sulfonamide derivatives, Alendronate sodium, and Dilaudid [hydromorphone hcl]   Social History   Tobacco Use  . Smoking status: Former Smoker     Packs/day: 0.33    Years: 34.00    Pack years: 11.22    Types: Cigarettes    Quit date: 10/24/2017    Years since quitting: 1.2  . Smokeless tobacco: Never Used  . Tobacco comment: quit smoking october 2019  Substance Use Topics  . Alcohol use: Yes    Alcohol/week: 0.0 standard drinks    Comment: occasional  . Drug use: Never    Family History: family history includes Alzheimer's disease in her mother; Arthritis in her brother; Breast cancer in her maternal aunt; Colon cancer in her mother; Colon polyps in her brother; Healthy in her son; Heart disease in her mother; Hypertension in her mother; Kidney failure in her mother; Suicidality in her brother.  ROS:   Please see the history of present illness.  Additional pertinent ROS: Constitutional: Negative for chills, fever, night sweats, unintentional weight loss  HENT:  Negative for ear pain and hearing loss.   Eyes: Negative for loss of vision and eye pain.  Respiratory: Negative for cough, sputum, wheezing.   Cardiovascular: See HPI. Gastrointestinal: Negative for abdominal pain, melena, and hematochezia.  Genitourinary: Negative for dysuria and hematuria.  Musculoskeletal: Negative for falls and myalgias.  Skin: Negative for itching and rash.  Neurological: Negative for focal weakness, focal sensory changes and loss of consciousness.  Endo/Heme/Allergies: Does bruise/bleed easily.     EKGs/Labs/Other Studies Reviewed:    The following studies were reviewed today: Echo 09/01/2017 - Left ventricle: The cavity size was normal. Systolic function was   mildly to moderately reduced. The estimated ejection fraction was   in the range of 40% to 45%. Diffuse hypokinesis. Doppler   parameters are consistent with abnormal left ventricular   relaxation (grade 1 diastolic dysfunction). - Left atrium: The atrium was normal in size. - Right ventricle: Systolic function was normal. - Pulmonary arteries: Systolic pressure was within the  normal   range.  Cardiac cath 2006:  DESCRIPTION OF PROCEDURE:  The patient was brought to the second floor [facility] in a postabsorptive state.  She was  premedicated with p.o. Valium.  Her right groin was prepped and shaved in  the usual sterile fashion.  1% Xylocaine was used for local anesthesia.  A 6  French sheath was inserted into the right femoral artery using standard  Seldinger technique.  A 6 French right and left Judkins diagnostic catheters  as well as 6 French pigtail catheter, were used for selective coronary  angiography, left ventriculography, and RCA angiography.  Visipaque dye was  used for the entirety of the case.  Retrograde aortic and left ventricular  pull back pressures were recorded.   HEMODYNAMIC DATA:  1.  Aortic systolic pressure 123456, diastolic pressure 91.  2.  Left ventricular systolic pressure [id number], end diastolic pressure 11.   SELECTIVE CORONARY ANGIOGRAPHY:  1.  Left main normal.  2.  LAD normal.  3.  The left circumflex was dominant normal.  4.  The right coronary artery was nondominant and was normal.   LEFT VENTRICULOGRAPHY:  RAO left ventriculogram was performed using 20 mL of  Visipaque dye at 10 mL per second.  The overall LVEF is greater than 50%  without wall motion abnormalities.   RCA ANGIOGRAPHY:  Performed in the LAO view using 20 mL of Visipaque dye at  20 mL per second, arch vessels were intact, there was no evidence of aortic  dissection.   IMPRESSION:  Ms. Ratterman has essentially normal coronary arteries, normal LV  function, no evidence of dissection.  I believe her chest pain is  noncardiac, most likely reflux related.  The sheath was removed and pressure  was held to achieve hemostasis.  The patient left the lab in stable  condition.  She will be observed overnight and discharged home in the  morning.  She will follow up with her primary care MD.  EKG:  EKG is personally reviewed.  The ekg  ordered today demonstrates NSR  Recent Labs: 08/23/2018: ALT 15; BUN 24; Creatinine, Ser 1.20; Potassium 4.5; Sodium 138 10/26/2018: TSH 0.51  Recent Lipid Panel    Component Value Date/Time   CHOL 198 08/23/2018 0838   TRIG 68.0 08/23/2018 0838   HDL 62.60 08/23/2018 0838   CHOLHDL 3 08/23/2018 0838   VLDL 13.6 08/23/2018 0838   LDLCALC 122 (H) 08/23/2018 0838    Physical Exam:  VS:  BP (!) 155/93   Pulse 90   Temp (!) 97.3 F (36.3 C)   Ht 5\' 6"  (1.676 m)   Wt 183 lb (83 kg)   SpO2 95%   BMI 29.54 kg/m     Wt Readings from Last 3 Encounters:  01/18/19 183 lb (83 kg)  12/07/18 181 lb (82.1 kg)  10/31/18 176 lb 6 oz (80 kg)    GEN: Well nourished, well developed in no acute distress HEENT: Normal, moist mucous membranes NECK: No JVD CARDIAC: regular rhythm, normal S1 and S2, no rubs or gallops. No murmurs. VASCULAR: Radial and DP pulses 2+ bilaterally. No carotid bruits RESPIRATORY:  Clear to auscultation without rales, wheezing or rhonchi, though diminished in all lung fields. ABDOMEN: Soft, non-tender, non-distended MUSCULOSKELETAL:  Ambulates independently SKIN: Warm and dry, no edema NEUROLOGIC:  Alert and oriented x 3. No focal neuro deficits noted. PSYCHIATRIC:  Normal affect    ASSESSMENT:    1. Abnormal echocardiogram   2. Aortic atherosclerosis ([place])   3. Family history of heart disease   4. Cardiac risk counseling   5. Counseling on health promotion and disease prevention   6. Nonischemic cardiomyopathy ([place])   7. Medication management    PLAN:    Abnormal echo, with reduced EF consistent with nonischemic cardiomyopathy: NYHA class I-II.  -stress test negative for ischemia, remote cath normal -discussed that there are multiples etiologies for this that can need additional workup to tease apart. She prefers to just start with medical management and monitor for improvement on GDMT -already on metoprolol succinate 50 mg daily, continue -discussed  data for ACEi/ARB/ARNI in NICM. Will avoid ACEi to avoid cough (she states she does not cough currently with her COPD regimen, but new cough would be difficult to determine ACEi vs pulm). She is concerned about hypotension on a medication (had previously), so will start with low dose ARB.  -starting valsartan 40 mg daily -recheck BMET 2 weeks after start -if she does well, we can monitor. Discussed 6 mos appt, but if she is doing well can stretch to 1 year and call PRN if needed, which she prefers. -recheck echo for change in symptoms  Elevated blood pressure reading: denies diagnosis of hypertension, though endorses she was briefly on antihypertensive in the past -using valsartan for NICM, as above, will also help with blood pressure  Aortic atherosclerosis: incidental on CT scans. Prevention as below  Family history of heart disease: no premature disease, but multiple members of the family with ASCVD  Cardiac risk counseling and prevention recommendations: -recommend heart healthy/Mediterranean diet, with whole grains, fruits, vegetable, fish, lean meats, nuts, and olive oil. Limit salt. -recommend moderate walking, 3-5 times/week for 30-50 minutes each session. Aim for at least 150 minutes.week. Goal should be pace of 3 miles/hours, or walking 1.5 miles in 30 minutes -recommend avoidance of tobacco products. Avoid excess alcohol. -Additional risk factor control:  -Diabetes risk: A1c is not available, denies history of diabetes  -Lipids: LDL 122, with ASCVD risk 10%. Would consider statin, discuss again at future visit  -Blood pressure control: as above  -Weight: BMI 29. Encourage good diet/exercise habits -ASCVD risk score: The 10-year ASCVD risk score Mikey Bussing DC Jr., et al., 2013) is: 10.2%   Values used to calculate the score:     Age: 70 years     Sex: Female     Is Non-Hispanic African American: No     Diabetic: No     Tobacco smoker:  No     Systolic Blood Pressure: 99991111 mmHg      Is BP treated: Yes     HDL Cholesterol: 62.6 mg/dL     Total Cholesterol: 198 mg/dL    Plan for follow up:  Total time of encounter: 55 minutes total time of encounter, including 40 minutes spent in face-to-face patient care. This time includes coordination of care and counseling regarding NICM, blood pressure, medication recommendations, CV risk counseling. Remainder of non-face-to-face time involved reviewing chart documents/testing relevant to the patient encounter and documentation in the medical record.  Buford Dresser, MD, PhD [place]  [facility]    Medication Adjustments/Labs and Tests Ordered: Current medicines are reviewed at length with the patient today.  Concerns regarding medicines are outlined above.  Orders Placed This Encounter  Procedures  . Basic metabolic panel  . EKG 12-Lead   Meds ordered this encounter  Medications  . valsartan (DIOVAN) 40 MG tablet    Sig: Take 1 tablet (40 mg total) by mouth daily.    Dispense:  30 tablet    Refill:  11    Patient Instructions  Medication Instructions:  Start: Valsartan 40 mg daily  *If you need a refill on your cardiac medications before your next appointment, please call your pharmacy*  Lab Work: Your physician recommends that you return for lab work in 2 weeks (BMP).  Testing/Procedures: None  Follow-Up: At [facility], you and your health needs are our priority.  As part of our continuing mission to provide you with exceptional heart care, we have created designated Provider Care Teams.  These Care Teams include your primary Cardiologist (physician) and Advanced Practice Providers (APPs -  Physician Assistants and Nurse Practitioners) who all work together to provide you with the care you need, when you need it.  Your next appointment:   1 year(s)  The format for your next appointment:   In Person  Provider:   Buford Dresser, MD      Signed, Buford Dresser, MD  PhD 01/18/2019 10:38 PM    [place]

## 2019-01-18 NOTE — Patient Instructions (Signed)
Medication Instructions:  Start: Valsartan 40 mg daily  *If you need a refill on your cardiac medications before your next appointment, please call your pharmacy*  Lab Work: Your physician recommends that you return for lab work in 2 weeks (BMP).  Testing/Procedures: None  Follow-Up: At [facility], you and your health needs are our priority.  As part of our continuing mission to provide you with exceptional heart care, we have created designated Provider Care Teams.  These Care Teams include your primary Cardiologist (physician) and Advanced Practice Providers (APPs -  Physician Assistants and Nurse Practitioners) who all work together to provide you with the care you need, when you need it.  Your next appointment:   1 year(s)  The format for your next appointment:   In Person  Provider:   Buford Dresser, MD

## 2019-01-23 ENCOUNTER — Telehealth: Payer: Self-pay | Admitting: Primary Care

## 2019-01-23 ENCOUNTER — Encounter: Payer: Self-pay | Admitting: Primary Care

## 2019-01-23 ENCOUNTER — Telehealth (INDEPENDENT_AMBULATORY_CARE_PROVIDER_SITE_OTHER): Payer: Medicare Other | Admitting: Primary Care

## 2019-01-23 ENCOUNTER — Telehealth: Payer: Self-pay | Admitting: Pulmonary Disease

## 2019-01-23 DIAGNOSIS — J441 Chronic obstructive pulmonary disease with (acute) exacerbation: Secondary | ICD-10-CM

## 2019-01-23 DIAGNOSIS — I428 Other cardiomyopathies: Secondary | ICD-10-CM | POA: Diagnosis not present

## 2019-01-23 MED ORDER — DOXYCYCLINE HYCLATE 100 MG PO TABS: 100.0000 mg | ORAL_TABLET | Freq: Two times a day (BID) | ORAL | 0 refills | Status: DC

## 2019-01-23 MED ORDER — PREDNISONE 10 MG PO TABS: ORAL_TABLET | ORAL | 0 refills | Status: DC

## 2019-01-23 NOTE — Patient Instructions (Addendum)
Recommendations: - Continue mucinex twice daily for 1-2 weeks - Continue Breztri two puff twice daily (will look into cheaper options if available, otherwise stay on current maintenance inhaler) - Use albuterol rescue inhaler every 4-6 hours for breakthrough shortness of breath/wheezing  Rx: Prednisone taper as prescribed  Doxycycline 1 tab twice daily x 7 days  Follow-up  Return if symptoms do not improve or worsen with above plan

## 2019-01-23 NOTE — Telephone Encounter (Signed)
Called to discuss with patient but no answer.  Left voicemail for return call.    Breztri 3 month supply was billed by [facility] order pharmacy on 12/31/2018 with a co-pay of $435.  She was likely in catastrophic coverage for Part D.  Attempted to do a test claim for Judithann Sauger but since a 90 day supply was filled 12/31/2018 it showed refill too soon and unable to determine copay for the new year.  A test claim for Trelegy was run and was $47 dollars for a 30 day supply.  Once Judithann Sauger is able to be filled it will likely be the same or similar price as Trelegy.  Recommend continue Breztri and if next co-pay is unaffordable we can look into patient assistance or  as they will not approve until patient spends 3% of annual income on out of pocket costs on prescriptions.  We can also look into an asthma grant but income must be 300% to 400% below [employer].  Mariella Saa, PharmD, [place], [place] Clinical Specialty Pharmacist [phone]  01/23/2019 4:13 PM

## 2019-01-23 NOTE — Progress Notes (Signed)
Virtual Visit via Video Note  I connected with April [name] on 01/23/19 at  2:30 PM EST by a video enabled telemedicine application and verified that I am speaking with the correct person using two identifiers.  Location: Patient: Home Provider: Office   I discussed the limitations of evaluation and management by telemedicine and the availability of in person appointments. The patient expressed understanding and agreed to proceed.  Synopsis: Referred in July 2020 for lung nodules by [name], MD  History of Present Illness: 64 year old female, former smoker quit October 2020. PMH significant for COPD stage III (FEV1 0.93 L), CAP, HTN, aortic atherosclerosis, temporal arteritis. Patient of Dr. Valeta Harms, last seen on 12/07/18. Maintenance inhaler changed to [employer]. CT imaging in September which revealed stability of her previous lung nodules.  We will recommend a 1 year follow-up due September 2021. Referred to cardiology for reduced ejection fraction on echocardiogram.  01/23/2019 Patient contacted today for acute video visit. Complains of congested cough x 2 days. Associated wheezing. She used her albuterol rescue inhaler once yesterday and twice today (morning and 2pm). She took mucinex today and was able to get up some mucus this morning which was green tinged. She has been on [place] since December, which she feels has improved her breathing. She was on wixella but it was not effective. Reports that she had no COPD exacerbations in 2020 requiring antibiotics. Denies fever, chills, sweats, chest tightness, hemoptysis, N/V/D, loss of smell/taste.   Observations/Objective:  Patient reported- Temp 97.6  PFTS 12/07/18-  Ratio 47, FEV1 0.93 L, 34% predicted, 16% bronchodilator response, increased RV/TLC ratio consistent with air trapping and DLCO 49% predicted  Assessment and Plan:  COPD exacerbation: - Congested cough x 2 days with associated wheezing  - Reports last  exacerbation requiring abx was in 2019  - RX doxycycline 1 tab twice daily x 7 days; prednisone taper (40mg  x 2 days; 30mg  x 2 days; 30mg  x 2 days; 10mg  x 2 days) - Recommend mucinex 600mg  twice daily  - Continue Breztri two puffs twice daily (will check with pharmacist if cheaper alternatives available); Albuterol rescue inhaler every 4-6 hours for breakthrough shortness of breath/wheezing  Non-ischemic cardiomyopathy: - She was seen by Dr. Shawna Orleans with cardiology on 01/18/19 - Stress test was negative for ischemia, remote cath normal - She was started on Valsartan (avoiding ACEi d/t COPD) - Follow-up in 6 months  Follow Up Instructions:  -Return if symptoms do not improve or worsen with above plan   I discussed the assessment and treatment plan with the patient. The patient was provided an opportunity to ask questions and all were answered. The patient agreed with the plan and demonstrated an understanding of the instructions.   The patient was advised to call back or seek an in-person evaluation if the symptoms worsen or if the condition fails to improve as anticipated.  I provided 22 minutes of non-face-to-face time during this encounter.   [name], NP

## 2019-01-23 NOTE — Telephone Encounter (Signed)
Thank you. That's what I told her but she seemed to think it was going to cost that until she hit the donut hole next year

## 2019-01-23 NOTE — Progress Notes (Signed)
PCCM: Thanks for seeing her.   Garner Nash, DO [place] [facility] [facility] 01/23/2019 5:32 PM

## 2019-01-23 NOTE — Telephone Encounter (Signed)
Can one of your team members help me determine if there is a cheaper alternative to [facility] for patient (states that it cost her $100 dollars a month). She has [employer] advantage I believe.   She has tried symb/spiriva but those were more expensive. Her COPD was not as well controlled on wixella.

## 2019-01-24 NOTE — Telephone Encounter (Signed)
Pt returning call to [employer]. Pt can be reached at [phone].

## 2019-01-24 NOTE — Telephone Encounter (Signed)
Will sign off. Pt had video visit with Beth on 1.18.2021.

## 2019-01-25 NOTE — Telephone Encounter (Signed)
Returned patient call.  Discussed findings which are documented previously in this encounter.  Patient states that they reversed the original claim) after the new year and states that her co-pay will be $110 per month.  The pharmacy explained it is more expensive as it is a nonformulary medication.  Patient states that our office did a nonformulary PA previously to cover this medication.  Patient states she has noticed improvement in her symptoms while on pressure treatment prefers to continue therapy if at all possible.  Advised she could apply for patient assistance to cover the cost of Breztri.  If she is not approved for patient assistance we could switch to Trelegy if patient is an appropriate candidate for DPI.  Patient verbalized understanding.  Patient currently has 24-month supply on hand.  Will mail patient assistance application to patient and she will return to office.  All questions encouraged and answered.  Instructed patient to call with any further questions or concerns.   Mariella Saa, PharmD, [place], [place] Clinical Specialty Pharmacist [phone]  01/25/2019 2:20 PM

## 2019-01-26 ENCOUNTER — Telehealth: Payer: Self-pay | Admitting: Primary Care

## 2019-01-26 NOTE — Telephone Encounter (Signed)
Called the patient and made her aware of the response received. The patient agreed to be seen in clinic 01/27/19 at 10:00. Will be seen by Derl Barrow, NP.   Patient aware to go to urgent care or ER if her symptoms worsen before the appointment. Nothing further needed at this time.

## 2019-01-26 NOTE — Telephone Encounter (Signed)
Sorry to hear that she is still feeling bad, if she is worse will need to be seen for sooner follow up . April [name] prescribed abx and steroids on 1/18, can set up ov with her tomorrow    To re-evaluate and consider additional options, +/- covid testing  Albuterol inhaler As needed  .   If she is worse will need to go to urgent care or ER .  Please contact office for sooner follow up if symptoms do not improve or worsen or seek emergency care

## 2019-01-26 NOTE — Telephone Encounter (Signed)
Patient was seen via video visit by [facility] on 01/23/19 and prescribed a Prednisone taper and Doxycycline 100mg . She called in today stating that she felt better yesterday, but today she is having a hard time breathing. She states that she can take 1-2 steps and she is out of breath and now has a dry cough. She denies any fever. Please advise.

## 2019-01-27 ENCOUNTER — Other Ambulatory Visit: Payer: Self-pay

## 2019-01-27 ENCOUNTER — Encounter: Payer: Self-pay | Admitting: Primary Care

## 2019-01-27 ENCOUNTER — Ambulatory Visit: Payer: Medicare Other | Admitting: Primary Care

## 2019-01-27 VITALS — BP 144/78 | HR 87 | Temp 97.5°F | Ht 66.0 in | Wt 187.6 lb

## 2019-01-27 DIAGNOSIS — J45909 Unspecified asthma, uncomplicated: Secondary | ICD-10-CM | POA: Diagnosis not present

## 2019-01-27 DIAGNOSIS — J441 Chronic obstructive pulmonary disease with (acute) exacerbation: Secondary | ICD-10-CM

## 2019-01-27 MED ORDER — METHYLPREDNISOLONE ACETATE 80 MG/ML IJ SUSP
80.0000 mg | Freq: Once | INTRAMUSCULAR | Status: AC
  Administered 2019-01-27: 11:00:00 80 mg via INTRAMUSCULAR

## 2019-01-27 MED ORDER — ALBUTEROL SULFATE (2.5 MG/3ML) 0.083% IN NEBU: 2.5000 mg | INHALATION_SOLUTION | Freq: Four times a day (QID) | RESPIRATORY_TRACT | 3 refills | Status: DC | PRN

## 2019-01-27 MED ORDER — PREDNISONE 10 MG PO TABS: ORAL_TABLET | ORAL | 0 refills | Status: DC

## 2019-01-27 MED ORDER — FLUTICASONE PROPIONATE 50 MCG/ACT NA SUSP: 2.0000 | Freq: Every day | NASAL | 1 refills | Status: DC

## 2019-01-27 NOTE — Assessment & Plan Note (Addendum)
-  Hx COPD with asthma overlap. Treated for exacerbation on 1/18 with Doxycycline and prednisone taper. She continues to have moderate amount of chest tightness/wheezing with associated dry cough.   - Lungs diminished on exam. No clinical signs of bacterial infection or pneumonia  - Continue doxycycline until completed. No additional abx needed at this time - Received Depo-medrol 80mg  IM x1 in office - Resume prednisone at 40mg  x 3 days; 30mg  x 3 days; 20mg  x 3 days; 10mg  x 3 days - Continue Beztri two puffs twice daily; albuterol hfa/nebulizer q 4-6 hours for breakthrough shortness of breath or wheezing  - Recommend delsym cough syrup twice daily for cough - Low suspicion for COVID - Order for nebulizer machine  - FU in 1 week (if no improvement need CXR)

## 2019-01-27 NOTE — Patient Instructions (Addendum)
Recommendation: - Resume 40mg  prednisone x 3 days; then 30mg  x 3 days; then 20mg  x 3 days; then 10mg  x 3 days - Complete doxycycline course - Use flonase 1-2 spray per nostril in evening  - Take regular delsym twice daily (or other over the counter cough suppressant) - Try throat coat tea (avoid cough drops with mint or menthol)  Orders: - Depo-medrol IM x1 today - New order for nebulizer machine (DME)  Rx: Prednisone taper  Flonase nasal spray  Albuterol nebulizer medication   Follow-up: - 1 week OV or virtual visit  Or sooner if symptoms worsen

## 2019-01-27 NOTE — Progress Notes (Signed)
@Patient  ID: April [name], female    DOB: 1955-04-08, 64 y.o.   MRN: [id number]  Chief Complaint  Patient presents with  . Follow-up    SOB, dry cough, wheezing, tightness in chest    Referring provider: [name], MD  HPI: 64 year old female, former smoker quit October 2020. PMH significant for COPD stage III (FEV1 0.93 L), CAP, HTN, aortic atherosclerosis, temporal arteritis. Patient of Dr. Valeta Harms, last seen on 12/07/18. Maintenance inhaler changed to [employer]. CT imaging in September which revealed stability of her previous lung nodules.  We will recommend a 1 year follow-up due September 2021. Referred to cardiology for reduced ejection fraction on echocardiogram.  Previous [facility] pulmonary encounter: 01/23/2019 Patient contacted today for acute video visit. Complains of congested cough x 2 days. Associated wheezing. She used her albuterol rescue inhaler once yesterday and twice today (morning and 2pm). She took mucinex today and was able to get up some mucus this morning which was green tinged. She has been on [place] since December, which she feels has improved her breathing. She was on wixella but it was not effective. Reports that she had no COPD exacerbations in 2020 requiring antibiotics. Denies fever, chills, sweats, chest tightness, hemoptysis, N/V/D, loss of smell/taste.   01/27/2019 Patient presents today for acute visit. Reports more chest tightness and wheezing. Dry cough, unable to produce mucus. She had a headache earlier this week. Last Friday Jan 15th she went to restaurant for dinner with son and his girlfriend. Some sinuses drainage prior to symptoms starting. She very seldomly leaves her house, went to the drug store this week and grocery store. When she uses her Albuterol rescue inhaler she does feel that it helps. She is typically well controlled on Breztri two puffs twice daily. No fever, chills, sweats, sore throat,  N/V/D, no change in smell/taste. She is on  the waitlist for Covid vaccine, first availability is in February.    Allergies  Allergen Reactions  . Sulfonamide Derivatives     REACTION: As a child.  Throat closed, rash.  . Alendronate Sodium     GI upset  . Dilaudid [Hydromorphone Hcl] Nausea And Vomiting    Immunization History  Administered Date(s) Administered  . Fluad Quad(high Dose 65+) 08/26/2018  . Influenza Split 10/15/2010  . Influenza,inj,Quad PF,6+ Mos 10/05/2012, 10/24/2013, 10/12/2014, 11/12/2015, 10/16/2016, 10/14/2017  . Pneumococcal Conjugate-13 10/26/2018  . Pneumococcal Polysaccharide-23 01/05/2009  . Tdap 04/06/2011    Past Medical History:  Diagnosis Date  . Anxiety   . Anxiety and depression    related to caring for mother during terminal illness  . Arthritis   . Asthma   . AVN (avascular necrosis of bone) (HCC)    hip and wrist  . Bronchitis    hx of  . COPD (chronic obstructive pulmonary disease) ([place])   . Depression   . Endometriosis   . FH: CAD (coronary artery disease)   . History of palpitations   . Hypothyroid   . Osteopenia    forteo through Dr. Estanislado Pandy (started 10/12)  . PMR (polymyalgia rheumatica) (HCC)   . SIRS (systemic inflammatory response syndrome) ([place]) 10/2017  . Smoker   . SOB (shortness of breath) on exertion   . Squamous cell carcinoma    facial, 2016  . Temporal arteritis (HCC)    s/p prednisone taper    Tobacco History: Social History   Tobacco Use  Smoking Status Former Smoker  . Packs/day: 0.33  . Years: 34.00  .  Pack years: 11.22  . Types: Cigarettes  . Quit date: 10/24/2017  . Years since quitting: 1.2  Smokeless Tobacco Never Used  Tobacco Comment   quit smoking october 2019   Counseling given: Not Answered Comment: quit smoking october 2019   Outpatient Medications Prior to Visit  Medication Sig Dispense Refill  . acetaminophen (TYLENOL) 325 MG tablet Take 2 tablets (650 mg total) by mouth every 6 (six) hours as needed.    Marland Kitchen albuterol  (VENTOLIN HFA) 108 (90 Base) MCG/ACT inhaler Inhale 2 puffs into the lungs every 6 (six) hours as needed for wheezing or shortness of breath. Okay to dispense proair/Ventolin/albuterol. 18 g 3  . ALPRAZolam (XANAX) 0.5 MG tablet Take 0.5-1 tablets (0.25-0.5 mg total) by mouth 2 (two) times daily as needed for anxiety. 60 tablet 2  . Bioflavonoid Products (VITAMIN C) CHEW Chew 1 tablet by mouth 2 (two) times daily.     Renard Hamper Pepper-Turmeric (TURMERIC CURCUMIN) 05-998 MG CAPS Take by mouth.    . Budeson-Glycopyrrol-Formoterol (BREZTRI AEROSPHERE) 160-9-4.8 MCG/ACT AERO Inhale 2 puffs into the lungs 2 (two) times daily. 17.3 g 0  . buPROPion (WELLBUTRIN XL) 300 MG 24 hr tablet TAKE 1 TABLET BY MOUTH  DAILY 90 tablet 2  . Calcium 500 MG CHEW Chew 1 tablet by mouth 2 (two) times daily.     . Cholecalciferol (VITAMIN D3) 250 MCG (10000 UT) capsule Take 10,000 Units by mouth daily.    Marland Kitchen doxycycline (VIBRA-TABS) 100 MG tablet Take 1 tablet (100 mg total) by mouth 2 (two) times daily. 14 tablet 0  . levothyroxine (SYNTHROID) 125 MCG tablet TAKE 1 TABLET BY MOUTH  DAILY EXCEPT TAKE 1 AND 1/2 TABLETS ON SUNDAY 98 tablet 2  . meclizine (ANTIVERT) 25 MG tablet Take 0.5-1 tablets (12.5-25 mg total) by mouth 3 (three) times daily as needed for dizziness. 30 tablet 1  . metoprolol succinate (TOPROL-XL) 50 MG 24 hr tablet Take 1/2 tab daily.    Vladimir Faster Glycol-Propyl Glycol (SYSTANE OP) Apply 1 drop to eye daily as needed (dryness).    Marland Kitchen tiZANidine (ZANAFLEX) 4 MG tablet TAKE 1/2 TO 1 TABLET BY MOUTH EVERY 6 HOURS AS NEEDED FOR MUSCLE SPASMS 120 tablet 3  . valsartan (DIOVAN) 40 MG tablet Take 1 tablet (40 mg total) by mouth daily. 30 tablet 11  . predniSONE (DELTASONE) 10 MG tablet Take 4 tabs po daily x 2 days; then 3 tabs for 2 days; then 2 tabs for 2 days; then 1 tab for 2 days 20 tablet 0  . Multiple Vitamin (MULTIVITAMIN) tablet Take 1 tablet by mouth daily.    . Potassium 99 MG TABS Take by mouth.      No facility-administered medications prior to visit.   Review of Systems  Review of Systems  Constitutional: Negative.   HENT: Positive for postnasal drip.   Respiratory: Positive for cough, chest tightness, shortness of breath and wheezing.   Cardiovascular: Negative.    Physical Exam  BP (!) 144/78 (BP Location: Left Arm, Cuff Size: Normal)   Pulse 87   Temp (!) 97.5 F (36.4 C) (Temporal)   Ht 5\' 6"  (1.676 m)   Wt 187 lb 9.6 oz (85.1 kg)   SpO2 97% Comment: RA  BMI 30.28 kg/m  Physical Exam Constitutional:      Appearance: Normal appearance.  HENT:     Head: Normocephalic and atraumatic.     Mouth/Throat:     Comments: Deferred d/t masking Cardiovascular:  Rate and Rhythm: Normal rate and regular rhythm.  Pulmonary:     Effort: Pulmonary effort is normal.     Breath sounds: Normal breath sounds. No wheezing or rhonchi.     Comments: Diminished  Musculoskeletal:     Cervical back: Normal range of motion and neck supple.  Skin:    General: Skin is warm and dry.  Neurological:     General: No focal deficit present.     Mental Status: She is alert and oriented to person, place, and time. Mental status is at baseline.  Psychiatric:        Mood and Affect: Mood normal.        Behavior: Behavior normal.        Thought Content: Thought content normal.        Judgment: Judgment normal.      Lab Results:  CBC    Component Value Date/Time   WBC 6.6 12/09/2017 0945   RBC 4.18 12/09/2017 0945   HGB 12.9 12/09/2017 0945   HCT 38.8 12/09/2017 0945   PLT 223.0 12/09/2017 0945   MCV 92.9 12/09/2017 0945   MCH 29.5 10/29/2017 0424   MCHC 33.1 12/09/2017 0945   RDW 15.8 (H) 12/09/2017 0945   LYMPHSABS 1.5 12/09/2017 0945   MONOABS 0.6 12/09/2017 0945   EOSABS 0.1 12/09/2017 0945   BASOSABS 0.1 12/09/2017 0945    BMET    Component Value Date/Time   NA 138 08/23/2018 0838   K 4.5 08/23/2018 0838   CL 102 08/23/2018 0838   CO2 28 08/23/2018 0838    GLUCOSE 101 (H) 08/23/2018 0838   BUN 24 (H) 08/23/2018 0838   CREATININE 1.20 08/23/2018 0838   CREATININE 1.13 (H) 06/03/2016 1213   CALCIUM 10.0 08/23/2018 0838   GFRNONAA 43 (L) 10/29/2017 0424   GFRNONAA 52 (L) 06/03/2016 1213   GFRAA 50 (L) 10/29/2017 0424   GFRAA 60 06/03/2016 1213    BNP No results found for: BNP  ProBNP No results found for: PROBNP  Imaging: No results found.   Assessment & Plan:   COPD (chronic obstructive pulmonary disease) (HCC) -Hx COPD with asthma overlap. Treated for exacerbation on 1/18 with Doxycycline and prednisone taper. She continues to have moderate amount of chest tightness/wheezing with associated dry cough.   - Lungs diminished on exam. No clinical signs of bacterial infection or pneumonia  - Continue doxycycline until completed. No additional abx needed at this time - Received Depo-medrol 80mg  IM x1 in office - Resume prednisone at 40mg  x 3 days; 30mg  x 3 days; 20mg  x 3 days; 10mg  x 3 days - Continue Beztri two puffs twice daily; albuterol hfa/nebulizer q 4-6 hours for breakthrough shortness of breath or wheezing  - Recommend delsym cough syrup twice daily for cough - Low suspicion for COVID - Order for nebulizer machine  - FU in 1 week (if no improvement need CXR)  [name], NP 01/27/2019

## 2019-01-30 DIAGNOSIS — J449 Chronic obstructive pulmonary disease, unspecified: Secondary | ICD-10-CM | POA: Diagnosis not present

## 2019-01-30 DIAGNOSIS — J45909 Unspecified asthma, uncomplicated: Secondary | ICD-10-CM | POA: Diagnosis not present

## 2019-01-31 NOTE — Progress Notes (Signed)
PCCM: Thanks for seeing her.  Garner Nash, DO [facility] 01/31/2019 8:56 AM

## 2019-02-03 ENCOUNTER — Telehealth (INDEPENDENT_AMBULATORY_CARE_PROVIDER_SITE_OTHER): Payer: Medicare Other | Admitting: Primary Care

## 2019-02-03 ENCOUNTER — Encounter: Payer: Self-pay | Admitting: Primary Care

## 2019-02-03 DIAGNOSIS — J441 Chronic obstructive pulmonary disease with (acute) exacerbation: Secondary | ICD-10-CM | POA: Diagnosis not present

## 2019-02-03 DIAGNOSIS — Z79899 Other long term (current) drug therapy: Secondary | ICD-10-CM | POA: Diagnosis not present

## 2019-02-03 MED ORDER — AEROCHAMBER MV MISC: 0 refills | Status: DC

## 2019-02-03 NOTE — Patient Instructions (Addendum)
Recommendations: - Continue Breztri inhaler two puffs twice daily (use with spacer) - Use albuterol nebulizer every 6 hours when you develop acute shortness of breath or wheezing  Orders:  Spacer  Follow-up: Cancel March Appointment and push out to April  Or return sooner if symptoms return or worsen    Chronic Obstructive Pulmonary Disease Chronic obstructive pulmonary disease (COPD) is a long-term (chronic) lung problem. When you have COPD, it is hard for air to get in and out of your lungs. Usually the condition gets worse over time, and your lungs will never return to normal. There are things you can do to keep yourself as healthy as possible.  Your doctor may treat your condition with: ? Medicines. ? Oxygen. ? Lung surgery.  Your doctor may also recommend: ? Rehabilitation. This includes steps to make your body work better. It may involve a team of specialists. ? Quitting smoking, if you smoke. ? Exercise and changes to your diet. ? Comfort measures (palliative care). Follow these instructions at home: Medicines  Take over-the-counter and prescription medicines only as told by your doctor.  Talk to your doctor before taking any cough or allergy medicines. You may need to avoid medicines that cause your lungs to be dry. Lifestyle  If you smoke, stop. Smoking makes the problem worse. If you need help quitting, ask your doctor.  Avoid being around things that make your breathing worse. This may include smoke, chemicals, and fumes.  Stay active, but remember to rest as well.  Learn and use tips on how to relax.  Make sure you get enough sleep. Most adults need at least 7 hours of sleep every night.  Eat healthy foods. Eat smaller meals more often. Rest before meals. Controlled breathing Learn and use tips on how to control your breathing as told by your doctor. Try:  Breathing in (inhaling) through your nose for 1 second. Then, pucker your lips and breath out (exhale)  through your lips for 2 seconds.  Putting one hand on your belly (abdomen). Breathe in slowly through your nose for 1 second. Your hand on your belly should move out. Pucker your lips and breathe out slowly through your lips. Your hand on your belly should move in as you breathe out.  Controlled coughing Learn and use controlled coughing to clear mucus from your lungs. Follow these steps: 1. Lean your head a little forward. 2. Breathe in deeply. 3. Try to hold your breath for 3 seconds. 4. Keep your mouth slightly open while coughing 2 times. 5. Spit any mucus out into a tissue. 6. Rest and do the steps again 1 or 2 times as needed. General instructions  Make sure you get all the shots (vaccines) that your doctor recommends. Ask your doctor about a flu shot and a pneumonia shot.  Use oxygen therapy and pulmonary rehabilitation if told by your doctor. If you need home oxygen therapy, ask your doctor if you should buy a tool to measure your oxygen level (oximeter).  Make a COPD action plan with your doctor. This helps you to know what to do if you feel worse than usual.  Manage any other conditions you have as told by your doctor.  Avoid going outside when it is very hot, cold, or humid.  Avoid people who have a sickness you can catch (contagious).  Keep all follow-up visits as told by your doctor. This is important. Contact a doctor if:  You cough up more mucus than usual.  There  is a change in the color or thickness of the mucus.  It is harder to breathe than usual.  Your breathing is faster than usual.  You have trouble sleeping.  You need to use your medicines more often than usual.  You have trouble doing your normal activities such as getting dressed or walking around the house. Get help right away if:  You have shortness of breath while resting.  You have shortness of breath that stops you from: ? Being able to talk. ? Doing normal activities.  Your chest hurts  for longer than 5 minutes.  Your skin color is more blue than usual.  Your pulse oximeter shows that you have low oxygen for longer than 5 minutes.  You have a fever.  You feel too tired to breathe normally. Summary  Chronic obstructive pulmonary disease (COPD) is a long-term lung problem.  The way your lungs work will never return to normal. Usually the condition gets worse over time. There are things you can do to keep yourself as healthy as possible.  Take over-the-counter and prescription medicines only as told by your doctor.  If you smoke, stop. Smoking makes the problem worse. This information is not intended to replace advice given to you by your health care provider. Make sure you discuss any questions you have with your health care provider. Document Revised: 12/04/2016 Document Reviewed: 01/27/2016 Elsevier Patient Education  2020 [employer].

## 2019-02-03 NOTE — Progress Notes (Signed)
Virtual Visit via Video Note  I connected with April [name] on 02/03/19 at  1:30 PM EST by a video enabled telemedicine application and verified that I am speaking with the correct person using two identifiers.  Location: Patient: Home Provider: Office   I discussed the limitations of evaluation and management by telemedicine and the availability of in person appointments. The patient expressed understanding and agreed to proceed.  History of Present Illness:  64 year old female, former smoker quit October 2020. PMH significant for COPD stage III (FEV1 0.93 L), CAP, HTN, aortic atherosclerosis, temporal arteritis. Patient of Dr. Valeta Harms, last seen on 12/07/18. Maintenance inhaler changed to [employer]. CT imaging in September which revealed stability of her previous lung nodules.  We will recommend a 1 year follow-up due September 2021. Referred to cardiology for reduced ejection fraction on echocardiogram.  Previous [facility] pulmonary encounter: 01/23/2019 Patient contacted today for acute video visit. Complains of congested cough x 2 days. Associated wheezing. She used her albuterol rescue inhaler once yesterday and twice today (morning and 2pm). She took mucinex today and was able to get up some mucus this morning which was green tinged. She has been on [place] since December, which she feels has improved her breathing. She was on wixella but it was not effective. Reports that she had no COPD exacerbations in 2020 requiring antibiotics. Denies fever, chills, sweats, chest tightness, hemoptysis, N/V/D, loss of smell/taste.   01/27/2019 Patient presents today for acute visit. Reports more chest tightness and wheezing. Dry cough, unable to produce mucus. She had a headache earlier this week. Last Friday Jan 15th she went to restaurant for dinner with son and his girlfriend. Some sinuses drainage prior to symptoms starting. She very seldomly leaves her house, went to the drug store this week and  grocery store. When she uses her Albuterol rescue inhaler she does feel that it helps. She is typically well controlled on Breztri two puffs twice daily. No fever, chills, sweats, sore throat,  N/V/D, no change in smell/taste. She is on the waitlist for Covid vaccine, first availability is in February.    02/03/2019 Patient contacted today for 1 week follow-up video visit for COPD/asthma exacerbation. She received depo-medrol injection 80mg  IMx1 in office last week and was given extended prednisone taper. She completed oral course of doxycycline. She is feeling much better and her breathing has returned to normal. She continued to have some voice hoarseness which has been ongoing since late fall/early winter around the same time she started [place]. She received nebulizer machine and has used it twice. No active shortness of breath, chest tightness or wheezing.   Observations/Objective:  - Mild Voice hoarseness - Able to speak in full sentences, no observed shortness of breath/wheezing/cough  PFTS 12/07/18- Ratio 47, FEV1 0.93 L, 34% predicted, 16% bronchodilator response, increased RV/TLC ratio consistent with air trapping and DLCO 49% predicted  Assessment and Plan:  COPD/asthma exacerbation  - FEV1 35%, ratio 47 +BD; No hx eosinophilia.  - Prolonged exacerbation improved after depo-medrol IM x1 and two rounds of prednisone  - Continue Breztri two puffs twice daily - PRN albuterol hfa/nebulizer q6 hours for sob/wheezing  - Recommend adding spacer with HFA use d.t voice hoarseness (likely from ICS) - Advised Covid vaccine when able available   Non-ischemic cardiomyopathy: - She was seen by Dr. Shawna Orleans with cardiology on 01/18/19 - Stress test was negative for ischemia, remote cath normal - She was started on Valsartan (avoiding ACEi d/t COPD) - Follow-up  in 6 months  Follow Up Instructions: - Change March follow-up to April    I discussed the assessment and treatment plan with the  patient. The patient was provided an opportunity to ask questions and all were answered. The patient agreed with the plan and demonstrated an understanding of the instructions.   The patient was advised to call back or seek an in-person evaluation if the symptoms worsen or if the condition fails to improve as anticipated.  I provided 18 minutes of non-face-to-face time during this encounter.   [name], NP

## 2019-02-04 LAB — BASIC METABOLIC PANEL
BUN/Creatinine Ratio: 14 (ref 12–28)
BUN: 15 mg/dL (ref 8–27)
CO2: 22 mmol/L (ref 20–29)
Calcium: 8.8 mg/dL (ref 8.7–10.3)
Chloride: 104 mmol/L (ref 96–106)
Creatinine, Ser: 1.1 mg/dL — ABNORMAL HIGH (ref 0.57–1.00)
GFR calc Af Amer: 61 mL/min/{1.73_m2} (ref >59)
GFR calc non Af Amer: 53 mL/min/{1.73_m2} — ABNORMAL LOW (ref >59)
Glucose: 145 mg/dL — ABNORMAL HIGH (ref 65–99)
Potassium: 4.5 mmol/L (ref 3.5–5.2)
Sodium: 140 mmol/L (ref 134–144)

## 2019-02-06 ENCOUNTER — Other Ambulatory Visit: Payer: Self-pay

## 2019-02-06 MED ORDER — AEROCHAMBER MV MISC: 0 refills | Status: DC

## 2019-02-06 NOTE — Progress Notes (Signed)
areo

## 2019-02-07 NOTE — Progress Notes (Signed)
PCCM: thanks for seeing her. [facility] 02/07/2019 5:02 PM

## 2019-02-08 DIAGNOSIS — H5213 Myopia, bilateral: Secondary | ICD-10-CM | POA: Diagnosis not present

## 2019-02-08 DIAGNOSIS — H2513 Age-related nuclear cataract, bilateral: Secondary | ICD-10-CM | POA: Diagnosis not present

## 2019-02-08 DIAGNOSIS — H52223 Regular astigmatism, bilateral: Secondary | ICD-10-CM | POA: Diagnosis not present

## 2019-02-08 DIAGNOSIS — H524 Presbyopia: Secondary | ICD-10-CM | POA: Diagnosis not present

## 2019-02-16 ENCOUNTER — Other Ambulatory Visit: Payer: Self-pay | Admitting: Family Medicine

## 2019-02-17 ENCOUNTER — Other Ambulatory Visit: Payer: Self-pay

## 2019-02-17 MED ORDER — METOPROLOL SUCCINATE ER 25 MG PO TB24: 25.0000 mg | ORAL_TABLET | Freq: Every day | ORAL | 2 refills | Status: DC

## 2019-02-17 NOTE — Telephone Encounter (Signed)
Last office visit 10/31/2018 for dizziness.  Last refilled 03/23/2018 for #120 with 3 refills.  No future appointments with PCP.

## 2019-02-17 NOTE — Telephone Encounter (Signed)
Sent. Thanks.  Needs yearly exam when possible.  Thanks.

## 2019-02-17 NOTE — Telephone Encounter (Signed)
Received refill request from [facility] for Metoprolol. I called to confirm with patient what dose she was taking because on 10/31/2018 office visit patient was instructed to take metoprolol 50 mg 1/2 tablet daily and patient confirms that she is taking that dose still and requests 25 mg tablet to be sent in. RF sent.

## 2019-02-18 ENCOUNTER — Other Ambulatory Visit: Payer: Self-pay | Admitting: Primary Care

## 2019-02-20 ENCOUNTER — Other Ambulatory Visit: Payer: Self-pay

## 2019-03-05 ENCOUNTER — Ambulatory Visit: Payer: Medicare Other | Attending: Internal Medicine

## 2019-03-05 DIAGNOSIS — Z23 Encounter for immunization: Secondary | ICD-10-CM | POA: Insufficient documentation

## 2019-03-05 NOTE — Progress Notes (Signed)
   Covid-19 Vaccination Clinic  Name:  April [name]    MRN: [id number] DOB: 04-12-1955  03/05/2019  April [name] was observed post Covid-19 immunization for 15 minutes without incidence. She was provided with Vaccine Information Sheet and instruction to access the V-Safe system.   April [name] was instructed to call 911 with any severe reactions post vaccine: Marland Kitchen Difficulty breathing  . Swelling of your face and throat  . A fast heartbeat  . A bad rash all over your body  . Dizziness and weakness    Immunizations Administered    Name Date Dose VIS Date Route   Pfizer COVID-19 Vaccine 03/05/2019  4:17 PM 0.3 mL 12/16/2018 Intramuscular   Manufacturer: [place]   Lot: [id number]   [place]: [id number]

## 2019-03-06 NOTE — Progress Notes (Deleted)
@Patient  ID: April [name], female    DOB: Jan 30, 1955, 64 y.o.   MRN: [id number]  No chief complaint on file.   Referring provider: [name], MD  HPI:   PMH:  Smoker/ Smoking History:  Maintenance:   Pt of:   03/06/2019  - Visit     Questionaires / Pulmonary Flowsheets:   ACT:  No flowsheet data found.  MMRC: mMRC Dyspnea Scale mMRC Score  12/07/2018 1    Epworth:  No flowsheet data found.  Tests:   FENO:  No results found for: NITRICOXIDE  PFT: PFT Results Latest Ref Rng & Units 12/07/2018  FVC-Pre L 1.73  FVC-Predicted Pre % 49  FVC-Post L 1.99  FVC-Predicted Post % 56  Pre FEV1/FVC % % 46  Post FEV1/FCV % % 47  FEV1-Pre L 0.80  FEV1-Predicted Pre % 29  FEV1-Post L 0.93  DLCO UNC% % 49  DLCO COR %Predicted % 73  TLC L 6.80  TLC % Predicted % 123  RV % Predicted % 222    WALK:  No flowsheet data found.  Imaging: No results found.  Lab Results:  CBC    Component Value Date/Time   WBC 6.6 12/09/2017 0945   RBC 4.18 12/09/2017 0945   HGB 12.9 12/09/2017 0945   HCT 38.8 12/09/2017 0945   PLT 223.0 12/09/2017 0945   MCV 92.9 12/09/2017 0945   MCH 29.5 10/29/2017 0424   MCHC 33.1 12/09/2017 0945   RDW 15.8 (H) 12/09/2017 0945   LYMPHSABS 1.5 12/09/2017 0945   MONOABS 0.6 12/09/2017 0945   EOSABS 0.1 12/09/2017 0945   BASOSABS 0.1 12/09/2017 0945    BMET    Component Value Date/Time   NA 140 02/03/2019 1427   K 4.5 02/03/2019 1427   CL 104 02/03/2019 1427   CO2 22 02/03/2019 1427   GLUCOSE 145 (H) 02/03/2019 1427   GLUCOSE 101 (H) 08/23/2018 0838   BUN 15 02/03/2019 1427   CREATININE 1.10 (H) 02/03/2019 1427   CREATININE 1.13 (H) 06/03/2016 1213   CALCIUM 8.8 02/03/2019 1427   GFRNONAA 53 (L) 02/03/2019 1427   GFRNONAA 52 (L) 06/03/2016 1213   GFRAA 61 02/03/2019 1427   GFRAA 60 06/03/2016 1213    BNP No results found for: BNP  ProBNP No results found for: PROBNP  Specialty Problems      Pulmonary  Problems   Asthma    Qualifier: Diagnosis of  By: Damita Dunnings MD, Phillip Heal        COPD (chronic obstructive pulmonary disease) ([facility])   SOB (shortness of breath)   CAP (community acquired pneumonia)    L basilar xray findings       Pulmonary nodule    On CT 12/2017         Allergies  Allergen Reactions  . Sulfonamide Derivatives     REACTION: As a child.  Throat closed, rash.  . Alendronate Sodium     GI upset  . Dilaudid [Hydromorphone Hcl] Nausea And Vomiting    Immunization History  Administered Date(s) Administered  . Fluad Quad(high Dose 65+) 08/26/2018  . Influenza Split 10/15/2010  . Influenza,inj,Quad PF,6+ Mos 10/05/2012, 10/24/2013, 10/12/2014, 11/12/2015, 10/16/2016, 10/14/2017  . Influenza-Unspecified 08/26/2018  . PFIZER SARS-COV-2 Vaccination 03/05/2019  . Pneumococcal Conjugate-13 10/26/2018  . Pneumococcal Polysaccharide-23 01/05/2009  . Tdap 04/06/2011    Past Medical History:  Diagnosis Date  . Anxiety   . Anxiety and depression    related to caring for mother during terminal  illness  . Arthritis   . Asthma   . AVN (avascular necrosis of bone) ([facility])    hip and wrist  . Bronchitis    hx of  . COPD (chronic obstructive pulmonary disease) ([facility])   . Depression   . Endometriosis   . FH: CAD (coronary artery disease)   . History of palpitations   . Hypothyroid   . Osteopenia    forteo through Dr. Estanislado Pandy (started 10/12)  . PMR (polymyalgia rheumatica) ([facility])   . SIRS (systemic inflammatory response syndrome) ([facility]) 10/2017  . Smoker   . SOB (shortness of breath) on exertion   . Squamous cell carcinoma    facial, 2016  . Temporal arteritis ([facility])    s/p prednisone taper    Tobacco History: Social History   Tobacco Use  Smoking Status Former Smoker  . Packs/day: 0.33  . Years: 34.00  . Pack years: 11.22  . Types: Cigarettes  . Quit date: 10/24/2017  . Years since quitting: 1.3  Smokeless Tobacco Never Used  Tobacco Comment   quit  smoking october 2019   Counseling given: Not Answered Comment: quit smoking october 2019   Continue to not smoke  Outpatient Encounter Medications as of 03/07/2019  Medication Sig  . acetaminophen (TYLENOL) 325 MG tablet Take 2 tablets (650 mg total) by mouth every 6 (six) hours as needed.  Marland Kitchen albuterol (PROVENTIL) (2.5 MG/3ML) 0.083% nebulizer solution Take 3 mLs (2.5 mg total) by nebulization every 6 (six) hours as needed for wheezing or shortness of breath.  Marland Kitchen albuterol (VENTOLIN HFA) 108 (90 Base) MCG/ACT inhaler Inhale 2 puffs into the lungs every 6 (six) hours as needed for wheezing or shortness of breath. Okay to dispense proair/Ventolin/albuterol.  . ALPRAZolam (XANAX) 0.5 MG tablet Take 0.5-1 tablets (0.25-0.5 mg total) by mouth 2 (two) times daily as needed for anxiety.  Marland Kitchen Bioflavonoid Products (VITAMIN C) CHEW Chew 1 tablet by mouth 2 (two) times daily.   Renard Hamper Pepper-Turmeric (TURMERIC CURCUMIN) 05-998 MG CAPS Take by mouth.  . Budeson-Glycopyrrol-Formoterol (BREZTRI AEROSPHERE) 160-9-4.8 MCG/ACT AERO Inhale 2 puffs into the lungs 2 (two) times daily.  Marland Kitchen buPROPion (WELLBUTRIN XL) 300 MG 24 hr tablet TAKE 1 TABLET BY MOUTH  DAILY  . Calcium 500 MG CHEW Chew 1 tablet by mouth 2 (two) times daily.   . Cholecalciferol (VITAMIN D3) 250 MCG (10000 UT) capsule Take 10,000 Units by mouth daily.  . fluticasone (FLONASE) 50 MCG/ACT nasal spray SPRAY 2 SPRAYS INTO EACH NOSTRIL EVERY DAY  . levothyroxine (SYNTHROID) 125 MCG tablet TAKE 1 TABLET BY MOUTH  DAILY EXCEPT TAKE 1 AND 1/2 TABLETS ON SUNDAY  . meclizine (ANTIVERT) 25 MG tablet Take 0.5-1 tablets (12.5-25 mg total) by mouth 3 (three) times daily as needed for dizziness.  . metoprolol succinate (TOPROL-XL) 25 MG 24 hr tablet Take 1 tablet (25 mg total) by mouth daily.  . Multiple Vitamin (MULTIVITAMIN) tablet Take 1 tablet by mouth daily.  Vladimir Faster Glycol-Propyl Glycol (SYSTANE OP) Apply 1 drop to eye daily as needed (dryness).  .  predniSONE (DELTASONE) 10 MG tablet Take 4 tabs po daily x 3 days; then 3 tabs daily x3 days; then 2 tabs daily x3 days; then 1 tab daily x 3 days; then stop  . Spacer/Aero-Holding Chambers (AEROCHAMBER MV) inhaler Use as instructed  . tiZANidine (ZANAFLEX) 4 MG tablet TAKE 1/2 TO 1 TABLET BY  MOUTH EVERY 6 HOURS AS  NEEDED FOR MUSCLE SPASMS  . valsartan (DIOVAN) 40 MG  tablet Take 1 tablet (40 mg total) by mouth daily.  . [DISCONTINUED] fluticasone (FLONASE) 50 MCG/ACT nasal spray Place 2 sprays into both nostrils daily.   No facility-administered encounter medications on file as of 03/07/2019.     Review of Systems  Review of Systems   Physical Exam  There were no vitals taken for this visit.  Wt Readings from Last 5 Encounters:  01/27/19 187 lb 9.6 oz (85.1 kg)  01/18/19 183 lb (83 kg)  12/07/18 181 lb (82.1 kg)  10/31/18 176 lb 6 oz (80 kg)  08/26/18 177 lb (80.3 kg)    BMI Readings from Last 5 Encounters:  01/27/19 30.28 kg/m  01/18/19 29.54 kg/m  12/07/18 28.35 kg/m  10/31/18 28.04 kg/m  08/26/18 28.14 kg/m     Physical Exam    Assessment & Plan:   No problem-specific Assessment & Plan notes found for this encounter.    No follow-ups on file.   Lauraine Rinne, NP 03/06/2019   This appointment required *** minutes of patient care (this includes precharting, chart review, review of results, face-to-face care, etc.).

## 2019-03-07 ENCOUNTER — Ambulatory Visit: Payer: Medicare Other | Admitting: Pulmonary Disease

## 2019-03-30 ENCOUNTER — Other Ambulatory Visit: Payer: Self-pay

## 2019-03-30 ENCOUNTER — Encounter: Payer: Self-pay | Admitting: Orthopaedic Surgery

## 2019-03-30 ENCOUNTER — Ambulatory Visit: Payer: Medicare Other | Admitting: Orthopaedic Surgery

## 2019-03-30 VITALS — Ht 66.5 in | Wt 187.0 lb

## 2019-03-30 DIAGNOSIS — S93402A Sprain of unspecified ligament of left ankle, initial encounter: Secondary | ICD-10-CM

## 2019-03-30 DIAGNOSIS — M25572 Pain in left ankle and joints of left foot: Secondary | ICD-10-CM | POA: Insufficient documentation

## 2019-03-30 NOTE — Progress Notes (Signed)
Office Visit Note   Patient: April [name]           Date of Birth: 06/22/1955           MRN: [id number] Visit Date: 03/30/2019              Requested by: April Ghent, MD [address] [place],  [place] [address] PCP: April Ghent, MD   Assessment & Plan: Visit Diagnoses:  1. Sprain of left ankle, unspecified ligament, initial encounter   2. Pain in left ankle and joints of left foot     Plan: April [name] relates that about 3 months ago she started falling as a result of her left ankle "turning in".  Then she has had several near falls.  She is not aware that she has had any swelling or significant pain but rather just "weakness".  Her exam is relatively benign.  She is tender over the anterior talofibular fibulocalcaneal ligaments.  Motor and sensory exam intact.  I suspect based on her exam and her history that she probably has a sprain of the lateral ankle ligaments without obvious clinical instability.  I am going to apply an ankle support and try a course of physical therapy for ankle strengthening.  Reevaluate over the next 6 or so weeks and consider MRI scan if continues to be a problem. There is no localized swelling or retrocalcaneal pain  Follow-Up Instructions: Return if symptoms worsen or fail to improve.   Orders:  Orders Placed This Encounter  Procedures  . Ambulatory referral to Physical Therapy   No orders of the defined types were placed in this encounter.     Procedures: No procedures performed   Clinical Data: No additional findings.   Subjective: Chief Complaint  Patient presents with  . Left Foot - Pain  Patient presents today for her left foot. She said that her foot has started to roll out in the last 3-4 months and causes her to fall. No pain. She said that her ankle will feel "tired" at night. She said that she has also noticed that the if she is tired during the day, it seems to occur more often.  Denies numbness or  tingling or obvious swelling.  Wears good comfortable shoes. HPI  Review of Systems  Constitutional: Negative for fatigue.  HENT: Negative for ear pain.   Eyes: Negative for pain.  Respiratory: Positive for shortness of breath.   Cardiovascular: Negative for leg swelling.  Gastrointestinal: Negative for constipation and diarrhea.  Endocrine: Negative for cold intolerance and heat intolerance.  Genitourinary: Negative for difficulty urinating.  Musculoskeletal: Negative for joint swelling.  Skin: Negative for rash.  Allergic/Immunologic: Negative for food allergies.  Neurological: Positive for weakness.  Hematological: Bruises/bleeds easily.  Psychiatric/Behavioral: Negative for sleep disturbance.     Objective: Vital Signs: Ht 5' 6.5" (1.689 m)   Wt 187 lb (84.8 kg)   BMI 29.73 kg/m   Physical Exam Constitutional:      Appearance: She is well-developed.  Eyes:     Pupils: Pupils are equal, round, and reactive to light.  Pulmonary:     Effort: Pulmonary effort is normal.  Skin:    General: Skin is warm and dry.  Neurological:     Mental Status: She is alert and oriented to person, place, and time.  Psychiatric:        Behavior: Behavior normal.     Ortho Exam awake alert and oriented x3.  Comfortable  sitting.  Examination left ankle reveals no swelling or skin changes.  Some mild tenderness over the anterior talofibular and fibulocalcaneal ligaments.  No obvious instability and negative anterior drawer sign.  Peroneal tendons intact and without pain.  No pain in the retromedial malleolar region.  Motor and sensory exam intact.  No Achilles pain.  Normal dorsiflexion plantarflexion inversion and eversion with good strength  Specialty Comments:  No specialty comments available.  Imaging: No results found.   PMFS History: Patient Active Problem List   Diagnosis Date Noted  . Pain in left ankle and joints of left foot 03/30/2019  . Medicare welcome exam 08/29/2018    . Aortic atherosclerosis (April [name]) 12/17/2017  . Pulmonary nodule 12/17/2017  . Muscle spasm 12/13/2017  . GERD (gastroesophageal reflux disease) 12/12/2017  . CAP (community acquired pneumonia) 11/24/2017  . Left-sided chest wall pain 11/24/2017  . CKD (chronic kidney disease), stage III 10/28/2017  . Dizziness 10/27/2017  . Headache 10/27/2017  . Creatinine elevation 08/26/2017  . SOB (shortness of breath) 08/26/2017  . Tachycardia 06/29/2017  . HTN (hypertension) 06/23/2017  . Polymyalgia rheumatica (April [name]) 05/22/2016  . History of bilateral hip replacements 05/22/2016  . DDD (degenerative disc disease), lumbar 05/22/2016  . BPV (benign positional vertigo) 05/20/2016  . Colon cancer screening 08/20/2015  . Advance care planning 08/14/2014  . Vitamin D deficiency 08/14/2014  . COPD (chronic obstructive pulmonary disease) (April [name]) 06/04/2014  . Avascular necrosis of bone of right hip (April [name]) 10/06/2012  . Routine general medical examination at a health care facility 04/07/2011  . Osteopenia 06/09/2010  . AVN (avascular necrosis of bone) (April [name]) 05/16/2010  . Asthma 03/23/2010  . Osteoarthritis 03/23/2010  . Hypothyroidism 03/21/2010  . ANXIETY DEPRESSION 03/21/2010  . SMOKER 03/21/2010  . SKIN LESION 03/21/2010  . TEMPORAL ARTERITIS 03/21/2010   Past Medical History:  Diagnosis Date  . Anxiety   . Anxiety and depression    related to caring for mother during terminal illness  . Arthritis   . Asthma   . AVN (avascular necrosis of bone) (April [name])    hip and wrist  . Bronchitis    hx of  . COPD (chronic obstructive pulmonary disease) (April [name])   . Depression   . Endometriosis   . FH: CAD (coronary artery disease)   . History of palpitations   . Hypothyroid   . Osteopenia    forteo through April [name] (started 10/12)  . PMR (polymyalgia rheumatica) (April [name])   . SIRS (systemic inflammatory response syndrome) (April [name]) 10/2017  . Smoker   . SOB (shortness of breath) on exertion   .  Squamous cell carcinoma    facial, 2016  . Temporal arteritis (April [name])    s/p prednisone taper    Family History  Problem Relation Age of Onset  . Heart disease Mother   . Hypertension Mother   . Alzheimer's disease Mother   . Colon cancer Mother   . Kidney failure Mother   . Arthritis Brother   . Suicidality Brother   . Colon polyps Brother   . Breast cancer Maternal Aunt   . Healthy Son     Past Surgical History:  Procedure Laterality Date  . ABDOMINAL ADHESION SURGERY    . ABDOMINAL HYSTERECTOMY    . BREAST EXCISIONAL BIOPSY    . BREAST SURGERY Left    benign bx 1990  . CARDIAC CATHETERIZATION  2007   no PCI  . CHOLECYSTECTOMY    . COLONOSCOPY W/ POLYPECTOMY    . INCONTINENCE  SURGERY     2007   . JOINT REPLACEMENT Left 2012   left hip  . TONSILLECTOMY    . TOTAL HIP ARTHROPLASTY     L hip 2012  . TOTAL HIP ARTHROPLASTY Right 10/04/2012   Procedure: TOTAL HIP ARTHROPLASTY;  Surgeon: Garald Balding, MD;  Location: [facility];  Service: Orthopedics;  Laterality: Right;  . WRIST SURGERY     2012/ left wrist/ bone removed due to necrosis   Social History   Occupational History  . Not on file  Tobacco Use  . Smoking status: Former Smoker    Packs/day: 0.33    Years: 34.00    Pack years: 11.22    Types: Cigarettes    Quit date: 10/24/2017    Years since quitting: 1.4  . Smokeless tobacco: Never Used  . Tobacco comment: quit smoking october 2019  Substance and Sexual Activity  . Alcohol use: Yes    Alcohol/week: 0.0 standard drinks    Comment: occasional  . Drug use: Never  . Sexual activity: Not on file

## 2019-04-01 ENCOUNTER — Other Ambulatory Visit: Payer: Self-pay | Admitting: Family Medicine

## 2019-04-03 NOTE — Telephone Encounter (Signed)
Electronic refill request. Alprazolam Last office visit:   10/31/2018 Last Filled:     60 tablet 2 09/16/2018  Please advise.

## 2019-04-04 ENCOUNTER — Ambulatory Visit: Payer: Medicare Other | Attending: Internal Medicine

## 2019-04-04 DIAGNOSIS — Z23 Encounter for immunization: Secondary | ICD-10-CM

## 2019-04-04 NOTE — Telephone Encounter (Signed)
Sent. Thanks.   

## 2019-04-04 NOTE — Progress Notes (Signed)
   Covid-19 Vaccination Clinic  Name:  April [name]    MRN: [id number] DOB: 04-10-1955  04/04/2019  [name] was observed post Covid-19 immunization for 15 minutes without incident. She was provided with Vaccine Information Sheet and instruction to access the V-Safe system.   [name] was instructed to call 911 with any severe reactions post vaccine: Marland Kitchen Difficulty breathing  . Swelling of face and throat  . A fast heartbeat  . A bad rash all over body  . Dizziness and weakness   Immunizations Administered    Name Date Dose VIS Date Route   Pfizer COVID-19 Vaccine 04/04/2019 10:30 AM 0.3 mL 12/16/2018 Intramuscular   Manufacturer: [employer], [employer]   Lot: [id number]   [place]: [id number]

## 2019-04-05 ENCOUNTER — Ambulatory Visit: Payer: Medicare Other | Admitting: Pulmonary Disease

## 2019-04-05 NOTE — Progress Notes (Signed)
@Patient  ID: April [name], female    DOB: [birth year], 64 y.o.   MRN: [id number]  Chief Complaint  Patient presents with  . Follow-up    2 month f/u. States her breathing has been stable since last visit.     Referring provider: [name], MD  HPI:  64 year old female former smoker followed in our office for COPD  PMH: Anxiety, hypertension, degenerative disc disease, polymyalgia rheumatica, chronic kidney disease, GERD Smoker/ Smoking History: Former smoker.  Quit 2019 Maintenance:  Breztri  Pt of: Dr. Valeta Harms  04/06/2019  - Visit   64 year old female former smoker followed in our office for COPD.  She is completing a 15-month follow-up with our office today.  She reports that her breathing is stable.  She has had some increased nasal congestion and allergy-like symptoms.  She would like to discuss that today.  She is maintained on Breztri.  She was last treated for an acute exacerbation of her symptoms back in January/2021.  Questionaires / Pulmonary Flowsheets:   MMRC: mMRC Dyspnea Scale mMRC Score  04/06/2019 0  12/07/2018 1    Tests:   10/05/2018-CT chest without contrast-multiple small pulmonary nodules within the right lung, largest on prior substudy in the medial right apex is improved and decreased in size, other pulmonary nodules are stable, these could be followed with a repeat CT in 6 to 12 months, severe centrilobular emphysema  12/07/2018-pulmonary function test-FVC 1.73 (49% predicted), postbronchodilator ratio 47, postbronchodilator FEV1 0.93 (34% predicted), positive bronchodilator response, mid flow reversibility, DLCO 10.76 (49% predicted)  FENO:  No results found for: NITRICOXIDE  PFT: PFT Results Latest Ref Rng & Units 12/07/2018  FVC-Pre L 1.73  FVC-Predicted Pre % 49  FVC-Post L 1.99  FVC-Predicted Post % 56  Pre FEV1/FVC % % 46  Post FEV1/FCV % % 47  FEV1-Pre L 0.80  FEV1-Predicted Pre % 29  FEV1-Post L 0.93  DLCO UNC% % 49  DLCO COR  %Predicted % 73  TLC L 6.80  TLC % Predicted % 123  RV % Predicted % 222    WALK:  No flowsheet data found.  Imaging: No results found.  Lab Results:  CBC    Component Value Date/Time   WBC 6.6 12/09/2017 0945   RBC 4.18 12/09/2017 0945   HGB 12.9 12/09/2017 0945   HCT 38.8 12/09/2017 0945   PLT 223.0 12/09/2017 0945   MCV 92.9 12/09/2017 0945   MCH 29.5 10/29/2017 0424   MCHC 33.1 12/09/2017 0945   RDW 15.8 (H) 12/09/2017 0945   LYMPHSABS 1.5 12/09/2017 0945   MONOABS 0.6 12/09/2017 0945   EOSABS 0.1 12/09/2017 0945   BASOSABS 0.1 12/09/2017 0945    BMET    Component Value Date/Time   NA 140 02/03/2019 1427   K 4.5 02/03/2019 1427   CL 104 02/03/2019 1427   CO2 22 02/03/2019 1427   GLUCOSE 145 (H) 02/03/2019 1427   GLUCOSE 101 (H) 08/23/2018 0838   BUN 15 02/03/2019 1427   CREATININE 1.10 (H) 02/03/2019 1427   CREATININE 1.13 (H) 06/03/2016 1213   CALCIUM 8.8 02/03/2019 1427   GFRNONAA 53 (L) 02/03/2019 1427   GFRNONAA 52 (L) 06/03/2016 1213   GFRAA 61 02/03/2019 1427   GFRAA 60 06/03/2016 1213    BNP No results found for: BNP  ProBNP No results found for: PROBNP  Specialty Problems      Pulmonary Problems   Asthma    Qualifier: Diagnosis of  By: Damita Dunnings MD,  Phillip Heal        COPD (chronic obstructive pulmonary disease) (HCC)   SOB (shortness of breath)   CAP (community acquired pneumonia)    L basilar xray findings       Pulmonary nodule    On CT 12/2017      Allergic rhinitis      Allergies  Allergen Reactions  . Sulfonamide Derivatives     REACTION: As a child.  Throat closed, rash.  . Alendronate Sodium     GI upset  . Dilaudid [Hydromorphone Hcl] Nausea And Vomiting    Immunization History  Administered Date(s) Administered  . Fluad Quad(high Dose 65+) 08/26/2018  . Influenza Split 10/15/2010  . Influenza,inj,Quad PF,6+ Mos 10/05/2012, 10/24/2013, 10/12/2014, 11/12/2015, 10/16/2016, 10/14/2017  . Influenza-Unspecified  08/26/2018  . PFIZER SARS-COV-2 Vaccination 03/05/2019, 04/04/2019  . Pneumococcal Conjugate-13 10/26/2018  . Pneumococcal Polysaccharide-23 01/05/2009  . Tdap 04/06/2011    Past Medical History:  Diagnosis Date  . Anxiety   . Anxiety and depression    related to caring for mother during terminal illness  . Arthritis   . Asthma   . AVN (avascular necrosis of bone) (HCC)    hip and wrist  . Bronchitis    hx of  . COPD (chronic obstructive pulmonary disease) ([place])   . Depression   . Endometriosis   . FH: CAD (coronary artery disease)   . History of palpitations   . Hypothyroid   . Osteopenia    forteo through Dr. Estanislado Pandy (started 10/12)  . PMR (polymyalgia rheumatica) (HCC)   . SIRS (systemic inflammatory response syndrome) ([place]) 10/2017  . Smoker   . SOB (shortness of breath) on exertion   . Squamous cell carcinoma    facial, 2016  . Temporal arteritis (HCC)    s/p prednisone taper    Tobacco History: Social History   Tobacco Use  Smoking Status Former Smoker  . Packs/day: 0.33  . Years: 34.00  . Pack years: 11.22  . Types: Cigarettes  . Quit date: 10/24/2017  . Years since quitting: 1.4  Smokeless Tobacco Never Used  Tobacco Comment   quit smoking october 2019   Counseling given: Not Answered Comment: quit smoking october 2019  Continue to not smoke  Outpatient Encounter Medications as of 04/06/2019  Medication Sig  . acetaminophen (TYLENOL) 325 MG tablet Take 2 tablets (650 mg total) by mouth every 6 (six) hours as needed.  Marland Kitchen albuterol (PROVENTIL) (2.5 MG/3ML) 0.083% nebulizer solution Take 3 mLs (2.5 mg total) by nebulization every 6 (six) hours as needed for wheezing or shortness of breath.  Marland Kitchen albuterol (VENTOLIN HFA) 108 (90 Base) MCG/ACT inhaler Inhale 2 puffs into the lungs every 6 (six) hours as needed for wheezing or shortness of breath. Okay to dispense proair/Ventolin/albuterol.  . ALPRAZolam (XANAX) 0.5 MG tablet TAKE 1/2 TO 1 TABLET BY  MOUTH  TWICE DAILY AS NEEDED FOR ANXIETY  . Bioflavonoid Products (VITAMIN C) CHEW Chew 1 tablet by mouth 2 (two) times daily.   Renard Hamper Pepper-Turmeric (TURMERIC CURCUMIN) 05-998 MG CAPS Take by mouth.  . Budeson-Glycopyrrol-Formoterol (BREZTRI AEROSPHERE) 160-9-4.8 MCG/ACT AERO Inhale 2 puffs into the lungs 2 (two) times daily.  Marland Kitchen buPROPion (WELLBUTRIN XL) 300 MG 24 hr tablet TAKE 1 TABLET BY MOUTH  DAILY  . Calcium 500 MG CHEW Chew 1 tablet by mouth 2 (two) times daily.   . Cholecalciferol (VITAMIN D3) 250 MCG (10000 UT) capsule Take 10,000 Units by mouth daily.  . fluticasone (FLONASE) 50  MCG/ACT nasal spray SPRAY 2 SPRAYS INTO EACH NOSTRIL EVERY DAY  . levothyroxine (SYNTHROID) 125 MCG tablet TAKE 1 TABLET BY MOUTH  DAILY EXCEPT TAKE 1 AND 1/2 TABLETS ON SUNDAY  . meclizine (ANTIVERT) 25 MG tablet Take 0.5-1 tablets (12.5-25 mg total) by mouth 3 (three) times daily as needed for dizziness.  . metoprolol succinate (TOPROL-XL) 25 MG 24 hr tablet Take 1 tablet (25 mg total) by mouth daily.  . Multiple Vitamin (MULTIVITAMIN) tablet Take 1 tablet by mouth daily.  Vladimir Faster Glycol-Propyl Glycol (SYSTANE OP) Apply 1 drop to eye daily as needed (dryness).  . predniSONE (DELTASONE) 10 MG tablet Take 4 tabs po daily x 3 days; then 3 tabs daily x3 days; then 2 tabs daily x3 days; then 1 tab daily x 3 days; then stop  . Spacer/Aero-Holding Chambers (AEROCHAMBER MV) inhaler Use as instructed  . tiZANidine (ZANAFLEX) 4 MG tablet TAKE 1/2 TO 1 TABLET BY  MOUTH EVERY 6 HOURS AS  NEEDED FOR MUSCLE SPASMS  . valsartan (DIOVAN) 40 MG tablet Take 1 tablet (40 mg total) by mouth daily.  . [DISCONTINUED] fluticasone (FLONASE) 50 MCG/ACT nasal spray SPRAY 2 SPRAYS INTO EACH NOSTRIL EVERY DAY   No facility-administered encounter medications on file as of 04/06/2019.     Review of Systems  Review of Systems  Constitutional: Positive for fatigue. Negative for activity change and fever.  HENT: Positive for congestion,  postnasal drip, rhinorrhea and sneezing. Negative for sinus pressure, sinus pain and sore throat.   Respiratory: Positive for wheezing. Negative for cough and shortness of breath.   Cardiovascular: Negative for chest pain and palpitations.  Gastrointestinal: Negative for diarrhea, nausea and vomiting.  Musculoskeletal: Negative for arthralgias.  Neurological: Negative for dizziness.  Psychiatric/Behavioral: Negative for sleep disturbance. The patient is not nervous/anxious.      Physical Exam  BP 122/70   Pulse 85   Temp 98.4 F (36.9 C) (Temporal)   Ht 5' 6.5" (1.689 m)   Wt 182 lb 4.8 oz (82.7 kg)   SpO2 97% Comment: on RA  BMI 28.98 kg/m   Wt Readings from Last 5 Encounters:  04/06/19 182 lb 4.8 oz (82.7 kg)  03/30/19 187 lb (84.8 kg)  01/27/19 187 lb 9.6 oz (85.1 kg)  01/18/19 183 lb (83 kg)  12/07/18 181 lb (82.1 kg)    BMI Readings from Last 5 Encounters:  04/06/19 28.98 kg/m  03/30/19 29.73 kg/m  01/27/19 30.28 kg/m  01/18/19 29.54 kg/m  12/07/18 28.35 kg/m     Physical Exam Vitals and nursing note reviewed.  Constitutional:      General: She is not in acute distress.    Appearance: Normal appearance.  HENT:     Head: Normocephalic and atraumatic.     Right Ear: Tympanic membrane, ear canal and external ear normal. There is no impacted cerumen.     Left Ear: Tympanic membrane, ear canal and external ear normal. There is no impacted cerumen.     Nose: Congestion and rhinorrhea present.     Mouth/Throat:     Mouth: Mucous membranes are moist.     Pharynx: Oropharynx is clear.  Eyes:     Pupils: Pupils are equal, round, and reactive to light.  Cardiovascular:     Rate and Rhythm: Normal rate and regular rhythm.     Pulses: Normal pulses.     Heart sounds: Normal heart sounds. No murmur.  Pulmonary:     Effort: Pulmonary effort is normal. No respiratory  distress.     Breath sounds: No decreased air movement. No decreased breath sounds, wheezing or  rales.  Musculoskeletal:     Cervical back: Normal range of motion.  Skin:    General: Skin is warm and dry.     Capillary Refill: Capillary refill takes less than 2 seconds.  Neurological:     General: No focal deficit present.     Mental Status: She is alert and oriented to person, place, and time. Mental status is at baseline.     Gait: Gait normal.  Psychiatric:        Mood and Affect: Mood normal.        Behavior: Behavior normal.        Thought Content: Thought content normal.        Judgment: Judgment normal.       Assessment & Plan:   Former smoker Plan: Continue to not smoke  Allergic rhinitis Mild AR flare on exam today  plan: Start nasal saline rinses 1-2 times daily Sample provided today Can use Flonase after nasal saline rinses Start daily antihistamine  COPD (chronic obstructive pulmonary disease) ([place]) Plan: Continue Breztri  Referral to pulmonary rehab today Emphasized importance of increasing physical activity We will work on improving allergic rhinitis symptoms as noted on assessment and plan    Return in about 4 months (around 08/06/2019), or if symptoms worsen or fail to improve, for Follow up with Dr. Valeta Harms, Follow up with [name] FNP-C.   Lauraine Rinne, NP 04/06/2019   This appointment required 32 minutes of patient care (this includes precharting, chart review, review of results, face-to-face care, etc.).

## 2019-04-06 ENCOUNTER — Other Ambulatory Visit: Payer: Self-pay

## 2019-04-06 ENCOUNTER — Encounter: Payer: Self-pay | Admitting: Pulmonary Disease

## 2019-04-06 ENCOUNTER — Ambulatory Visit: Payer: Medicare Other | Admitting: Pulmonary Disease

## 2019-04-06 VITALS — BP 122/70 | HR 85 | Temp 98.4°F | Ht 66.5 in | Wt 182.3 lb

## 2019-04-06 DIAGNOSIS — Z87891 Personal history of nicotine dependence: Secondary | ICD-10-CM | POA: Diagnosis not present

## 2019-04-06 DIAGNOSIS — J439 Emphysema, unspecified: Secondary | ICD-10-CM | POA: Diagnosis not present

## 2019-04-06 DIAGNOSIS — J309 Allergic rhinitis, unspecified: Secondary | ICD-10-CM | POA: Diagnosis not present

## 2019-04-06 MED ORDER — FLUTICASONE PROPIONATE 50 MCG/ACT NA SUSP: NASAL | 3 refills | Status: DC

## 2019-04-06 NOTE — Assessment & Plan Note (Signed)
Plan: Continue Breztri  Referral to pulmonary rehab today Emphasized importance of increasing physical activity We will work on improving allergic rhinitis symptoms as noted on assessment and plan

## 2019-04-06 NOTE — Assessment & Plan Note (Signed)
Mild AR flare on exam today  plan: Start nasal saline rinses 1-2 times daily Sample provided today Can use Flonase after nasal saline rinses Start daily antihistamine

## 2019-04-06 NOTE — Assessment & Plan Note (Signed)
Plan: Continue to not smoke 

## 2019-04-06 NOTE — Patient Instructions (Addendum)
You were seen today by Lauraine Rinne, NP  for:   Nice seeing you in office today.  I am glad that your breathing is doing so well.  Today we will refer you to pulmonary rehab.  Continue your medications as prescribed.  Start nasal saline rinses.  Sample provided today.  Please do not hesitate to give [place] a call if you have any questions or concerns.  Take care and stay safe,  Atiana Levier  1. Pulmonary emphysema / COPD  Breztri >>> 2 puffs in the morning right when you wake up, rinse out your mouth after use, 12 hours later 2 puffs, rinse after use >>> Take this daily, no matter what >>> This is not a rescue inhaler   Only use your albuterol as a rescue medication to be used if you can't catch your breath by resting or doing a relaxed purse lip breathing pattern.  - The less you use it, the better it will work when you need it. - Ok to use up to 2 puffs  every 4 hours if you must but call for immediate appointment if use goes up over your usual need - Don't leave home without it !!  (think of it like the spare tire for your car)   Note your daily symptoms > remember "red flags" for COPD:   >>>Increase in cough >>>increase in sputum production >>>increase in shortness of breath or activity  intolerance.   If you notice these symptoms, please call the office to be seen.    2. Allergic rhinitis, unspecified seasonality, unspecified trigger  Please start taking a daily antihistamine:  >>>choose one of: zyrtec, claritin, allegra, or xyzal  >>>these are over the counter medications  >>>can choose generic option  >>>take daily  >>>this medication helps with allergies, post nasal drip, and cough   Please start nasal saline rinses 1-2 times daily Use distilled water Get water lukewarm Shake well  Can use Flonase after nasal saline rinse    Follow Up:    Return in about 4 months (around 08/06/2019), or if symptoms worsen or fail to improve, for Follow up with Dr. Valeta Harms, Follow up with  [name] FNP-C.   Please do your part to reduce the spread of COVID-19:      Reduce your risk of any infection  and COVID19 by using the similar precautions used for avoiding the common cold or flu:  Marland Kitchen Wash your hands often with soap and warm water for at least 20 seconds.  If soap and water are not readily available, use an alcohol-based hand sanitizer with at least 60% alcohol.  . If coughing or sneezing, cover your mouth and nose by coughing or sneezing into the elbow areas of your shirt or coat, into a tissue or into your sleeve (not your hands). Langley Gauss A MASK when in public  . Avoid shaking hands with others and consider head nods or verbal greetings only. . Avoid touching your eyes, nose, or mouth with unwashed hands.  . Avoid close contact with people who are sick. . Avoid places or events with large numbers of people in one location, like concerts or sporting events. . If you have some symptoms but not all symptoms, continue to monitor at home and seek medical attention if your symptoms worsen. . If you are having a medical emergency, call 911.   ADDITIONAL HEALTHCARE OPTIONS FOR PATIENTS  [facility] / e-Visit: [email or web address]         [facility]: [phone]  [facility]: [id number]                   [facility]: [id number]     It is flu season:   >>> Best ways to protect herself from the flu: Receive the yearly flu vaccine, practice good hand hygiene washing with soap and also using hand sanitizer when available, eat a nutritious meals, get adequate rest, hydrate appropriately   Please contact the office if your symptoms worsen or you have concerns that you are not improving.   Thank you for choosing [facility] for your healthcare, and for allowing [place] to partner with you on your healthcare journey. I am thankful to be able to provide care to you today.     [name] FNP-C    COPD and Physical Activity Chronic obstructive pulmonary disease (COPD) is a long-term (chronic) condition that affects the lungs. COPD is a general term that can be used to describe many different lung problems that cause lung swelling (inflammation) and limit airflow, including chronic bronchitis and emphysema. The main symptom of COPD is shortness of breath, which makes it harder to do even simple tasks. This can also make it harder to exercise and be active. Talk with your health care provider about treatments to help you breathe better and actions you can take to prevent breathing problems during physical activity. What are the benefits of exercising with COPD? Exercising regularly is an important part of a healthy lifestyle. You can still exercise and do physical activities even though you have COPD. Exercise and physical activity improve your shortness of breath by increasing blood flow (circulation). This causes your heart to pump more oxygen through your body. Moderate exercise can improve your:  Oxygen use.  Energy level.  Shortness of breath.  Strength in your breathing muscles.  Heart health.  Sleep.  Self-esteem and feelings of self-worth.  Depression, stress, and anxiety levels. Exercise can benefit everyone with COPD. The severity of your disease may affect how hard you can exercise, especially at first, but everyone can benefit. Talk with your health care provider about how much exercise is safe for you, and which activities and exercises are safe for you. What actions can I take to prevent breathing problems during physical activity?  Sign up for a pulmonary rehabilitation program. This type of program may include: ? Education about lung diseases. ? Exercise classes that teach you how to exercise and be more active while improving your breathing. This usually involves:  Exercise using your lower extremities, such as a stationary  bicycle.  About 30 minutes of exercise, 2 to 5 times per week, for 6 to 12 weeks  Strength training, such as push ups or leg lifts. ? Nutrition education. ? Group classes in which you can talk with others who also have COPD and learn ways to manage stress.  If you use an oxygen tank, you should use it while you exercise. Work with your health care provider to adjust your oxygen for your physical activity. Your resting flow rate is different from your flow rate during physical activity.  While you are exercising: ? Take slow breaths. ? Pace yourself and do not try to go too fast. ? Purse your lips while breathing out. Pursing your lips is similar to a kissing or whistling position. ? If doing exercise that uses a quick burst of effort, such as weight lifting:  Breathe in before starting the  exercise.  Breathe out during the hardest part of the exercise (such as raising the weights). Where to find support You can find support for exercising with COPD from:  Your health care provider.  A pulmonary rehabilitation program.  Your local health department or community health programs.  Support groups, online or in-person. Your health care provider may be able to recommend support groups. Where to find more information You can find more information about exercising with COPD from:  American Lung Association: ClassInsider.se.  COPD Foundation: https://www.rivera.net/. Contact a health care provider if:  Your symptoms get worse.  You have chest pain.  You have nausea.  You have a fever.  You have trouble talking or catching your breath.  You want to start a new exercise program or a new activity. Summary  COPD is a general term that can be used to describe many different lung problems that cause lung swelling (inflammation) and limit airflow. This includes chronic bronchitis and emphysema.  Exercise and physical activity improve your shortness of breath by increasing blood flow  (circulation). This causes your heart to provide more oxygen to your body.  Contact your health care provider before starting any exercise program or new activity. Ask your health care provider what exercises and activities are safe for you. This information is not intended to replace advice given to you by your health care provider. Make sure you discuss any questions you have with your health care provider. Document Revised: 04/13/2018 Document Reviewed: 01/14/2017 Elsevier Patient Education  2020 [employer].

## 2019-04-07 ENCOUNTER — Ambulatory Visit: Payer: Medicare Other | Admitting: Pulmonary Disease

## 2019-04-17 ENCOUNTER — Other Ambulatory Visit: Payer: Self-pay

## 2019-04-17 ENCOUNTER — Ambulatory Visit: Payer: Medicare Other | Admitting: Physical Therapy

## 2019-04-17 ENCOUNTER — Encounter: Payer: Self-pay | Admitting: Physical Therapy

## 2019-04-17 DIAGNOSIS — R296 Repeated falls: Secondary | ICD-10-CM

## 2019-04-17 DIAGNOSIS — M25672 Stiffness of left ankle, not elsewhere classified: Secondary | ICD-10-CM

## 2019-04-17 DIAGNOSIS — R2681 Unsteadiness on feet: Secondary | ICD-10-CM

## 2019-04-17 DIAGNOSIS — M6281 Muscle weakness (generalized): Secondary | ICD-10-CM

## 2019-04-17 NOTE — Patient Instructions (Signed)
Access Code: [email or web address] URL: [email or web address] Date: 04/17/2019 Prepared by: Faustino Congress  Exercises Standing Balance with Eyes Closed on Foam - 2 x daily - 7 x weekly - 5 reps - 1 sets - 10-15 sec hold Standing Single Leg Stance with Unilateral Counter Support - 2 x daily - 7 x weekly - 3 reps - 1 sets - 10 sec hold Seated Ankle Eversion with Resistance - 2 x daily - 7 x weekly - 1 sets - 10 reps

## 2019-04-17 NOTE — Therapy (Signed)
[facility] Physical Therapy [address] [place], [place], [address] Phone: [phone]   Fax:  [phone]  Physical Therapy Evaluation  Patient Details  Name: April [name] MRN: [id number] Date of Birth: [birth year] Referring Provider (PT): Garald Balding, MD   Encounter Date: 04/17/2019  PT End of Session - 04/17/19 1123    Visit Number  1    Number of Visits  6    Date for PT Re-Evaluation  05/29/19    PT Start Time  1020    PT Stop Time  1105    PT Time Calculation (min)  45 min    Activity Tolerance  Patient tolerated treatment well    Behavior During Therapy  [facility] for tasks assessed/performed       Past Medical History:  Diagnosis Date  . Anxiety   . Anxiety and depression    related to caring for mother during terminal illness  . Arthritis   . Asthma   . AVN (avascular necrosis of bone) (HCC)    hip and wrist  . Bronchitis    hx of  . COPD (chronic obstructive pulmonary disease) ([place])   . Depression   . Endometriosis   . FH: CAD (coronary artery disease)   . History of palpitations   . Hypothyroid   . Osteopenia    forteo through Dr. Estanislado Pandy (started 10/12)  . PMR (polymyalgia rheumatica) (HCC)   . SIRS (systemic inflammatory response syndrome) ([place]) 10/2017  . Smoker   . SOB (shortness of breath) on exertion   . Squamous cell carcinoma    facial, 2016  . Temporal arteritis (HCC)    s/p prednisone taper    Past Surgical History:  Procedure Laterality Date  . ABDOMINAL ADHESION SURGERY    . ABDOMINAL HYSTERECTOMY    . BREAST EXCISIONAL BIOPSY    . BREAST SURGERY Left    benign bx 1990  . CARDIAC CATHETERIZATION  2007   no PCI  . CHOLECYSTECTOMY    . COLONOSCOPY W/ POLYPECTOMY    . INCONTINENCE SURGERY     2007   . JOINT REPLACEMENT Left 2012   left hip  . TONSILLECTOMY    . TOTAL HIP ARTHROPLASTY     L hip 2012  . TOTAL HIP ARTHROPLASTY Right 10/04/2012   Procedure: TOTAL HIP ARTHROPLASTY;  Surgeon: Garald Balding, MD;  Location: [facility];  Service: Orthopedics;  Laterality: Right;  . WRIST SURGERY     2012/ left wrist/ bone removed due to necrosis    There were no vitals filed for this visit.   Subjective Assessment - 04/17/19 1022    Subjective  Pt is a 64 y/o female who presents to OPPT for c/o Lt ankle instability x 2 months without known onset.  She reports posterior part of foot inverts and she's had multiple falls or LOB due to this.  Pt has had 4 falls due to this.    Pertinent History  Anxiety, hypertension, degenerative disc disease, polymyalgia rheumatica, chronic kidney disease, GERD    Limitations  Standing;Walking    Patient Stated Goals  1-2 sessions for HEP to work on strengthening    Currently in Pain?  No/denies    Pain Score  0-No pain         [facility] PT Assessment - 04/17/19 1026      Assessment   Medical Diagnosis  S93.402A (ICD-10-CM) - Sprain of left ankle, unspecified ligament, initial encounter    Referring Provider (PT)  Garald Balding, MD    Onset Date/Surgical Date  --   Feb 2021   Hand Dominance  Left    Next MD Visit  4 months    Prior Therapy  periodically throughout the years      Precautions   Precautions  Fall      Restrictions   Weight Bearing Restrictions  No      Balance Screen   Has the patient fallen in the past 6 months  Yes    How many times?  4    Has the patient had a decrease in activity level because of a fear of falling?   Yes    Is the patient reluctant to leave their home because of a fear of falling?   Yes      [place] residence    Living Arrangements  Alone    Available Help at Discharge  Friend(s)    Type of [place] to enter    Entrance Stairs-Number of Steps  1    [place]  One level      Prior Function   Level of Independence  Independent    Vocation  Retired    Biomedical scientist  retired Agricultural consultant    Leisure  gardening, reading, baking, music; yoga - tries to do daily      Cognition   Overall Cognitive Status  Within Functional Limits for tasks assessed      Functional Tests   Functional tests  Single leg stance      Single Leg Stance   Comments  RLE:2-3 sec; LLE: 8 sec      ROM / Strength   AROM / PROM / Strength  AROM;Strength;PROM      AROM   AROM Assessment Site  Ankle    Right/Left Ankle  Left    Left Ankle Dorsiflexion  -5    Left Ankle Plantar Flexion  51    Left Ankle Inversion  25    Left Ankle Eversion  14      PROM   PROM Assessment Site  Ankle    Right/Left Ankle  Left    Left Ankle Dorsiflexion  4      Strength   Strength Assessment Site  Ankle    Right/Left Ankle  Left    Left Ankle Dorsiflexion  4/5    Left Ankle Plantar Flexion  4/5    Left Ankle Inversion  4/5    Left Ankle Eversion  4/5      Palpation   Palpation comment  no significant tenderness or instability noted      Ambulation/Gait   Gait Comments  no significant deviations noted      High Level Balance   High Level Balance Comments  LOB on foam with EC x 2 trials (1 sec, 5 sec)                Objective measurements completed on examination: See above findings.      [place] Adult PT Treatment/Exercise - 04/17/19 1026      Exercises   Exercises  Ankle      Ankle Exercises: Standing   Other Standing Ankle Exercises  instructed in SLS and EC on compliant surface for HEP      Ankle Exercises: Seated   Other Seated Ankle Exercises  eversion with L2 band x 10 reps; Lt             PT Education - 04/17/19 1059    Education Details  HEP    Person(s) Educated  Patient    Methods  Explanation;Demonstration;Handout    Comprehension  Verbalized understanding;Returned demonstration;Need further instruction          PT Long Term Goals - 04/17/19 1129      PT LONG TERM GOAL #1   Title  independent with HEP    Status  New    Target Date   05/29/19      PT LONG TERM GOAL #2   Title  report no episodes of falling for improved balance and mobility    Status  New    Target Date  05/29/19      PT LONG TERM GOAL #3   Title  demonstrate improved balance by standing with EC on foam for at least 15 sec without LOB    Status  New    Target Date  05/29/19      PT LONG TERM GOAL #4   Title  improve Lt ankle strength to at least 4/5 for improved function    Status  New    Target Date  05/29/19      PT LONG TERM GOAL #5   Title  improve Lt ankle dorsiflexion active to at least neutral for improved flexibility and motion    Status  New    Target Date  05/29/19             Plan - 04/17/19 1123    Clinical Impression Statement  Pt is a 64 y/o female who presents to OPPT with c/o Lt ankle instability resulting in falls and near falls.  She demonstrates mild strength deficits, as well as high level balance deficits.  Does demonstrate some decreased vestibular input and feel this may be contributing to her balance and instability.  HEP issued today to address balance and strength.  Will benefit from PT 1x/wk for up to 6 weeks to address deficits noted.    Personal Factors and Comorbidities  Comorbidity 3+    Comorbidities  Anxiety, hypertension, degenerative disc disease, polymyalgia rheumatica, chronic kidney disease, GERD    Examination-Activity Limitations  Stairs;Stand;Lift;Locomotion Level    Examination-Participation Restrictions  Yard Work;Community Activity    Stability/Clinical Decision Making  Evolving/Moderate complexity    Clinical Decision Making  Moderate    Rehab Potential  Good    PT Frequency  1x / week    PT Duration  6 weeks   anticipate only needing 2-3 sessions   PT Treatment/Interventions  ADLs/Self Care Home Management;Cryotherapy;Electrical Stimulation;Moist Heat;Balance training;Therapeutic exercise;Therapeutic activities;Functional mobility training;Stair training;Gait training;Neuromuscular  re-education;Patient/family education;Vestibular;Taping;Dry needling;Canalith Repostioning    PT Next Visit Plan  review HEP, add gastroc stretch; progress balance exercises and ankle strengthening exercises    PT Home Exercise Plan  Access Code: [email or web address]    Consulted and Agree with Plan of Care  Patient       Patient will benefit from skilled therapeutic intervention in order to improve the following deficits and impairments:  Decreased strength, Difficulty walking, Decreased mobility, Decreased balance, Decreased range of motion, Impaired flexibility, Pain  Visit Diagnosis: Stiffness of left ankle, not elsewhere classified - Plan: PT plan of care cert/re-cert  Muscle weakness (generalized) - Plan: PT plan of care cert/re-cert  Unsteadiness on feet - Plan: PT plan of care cert/re-cert  Repeated falls - Plan: PT  plan of care cert/re-cert     Problem List Patient Active Problem List   Diagnosis Date Noted  . Allergic rhinitis 04/06/2019  . Pain in left ankle and joints of left foot 03/30/2019  . Medicare welcome exam 08/29/2018  . Aortic atherosclerosis ([place]) 12/17/2017  . Pulmonary nodule 12/17/2017  . Muscle spasm 12/13/2017  . GERD (gastroesophageal reflux disease) 12/12/2017  . CAP (community acquired pneumonia) 11/24/2017  . Left-sided chest wall pain 11/24/2017  . CKD (chronic kidney disease), stage III 10/28/2017  . Dizziness 10/27/2017  . Headache 10/27/2017  . Creatinine elevation 08/26/2017  . SOB (shortness of breath) 08/26/2017  . Tachycardia 06/29/2017  . HTN (hypertension) 06/23/2017  . Polymyalgia rheumatica ([place]) 05/22/2016  . History of bilateral hip replacements 05/22/2016  . DDD (degenerative disc disease), lumbar 05/22/2016  . BPV (benign positional vertigo) 05/20/2016  . Colon cancer screening 08/20/2015  . Advance care planning 08/14/2014  . Vitamin D deficiency 08/14/2014  . COPD (chronic obstructive pulmonary disease) ([place]) 06/04/2014  .  Avascular necrosis of bone of right hip ([place]) 10/06/2012  . Routine general medical examination at a health care facility 04/07/2011  . Osteopenia 06/09/2010  . AVN (avascular necrosis of bone) ([place]) 05/16/2010  . Asthma 03/23/2010  . Osteoarthritis 03/23/2010  . Hypothyroidism 03/21/2010  . ANXIETY DEPRESSION 03/21/2010  . Former smoker 03/21/2010  . SKIN LESION 03/21/2010  . TEMPORAL ARTERITIS 03/21/2010      Laureen Abrahams, PT, DPT 04/17/19 11:33 AM     [facility] Physical Therapy [address] [place], [place], [address] Phone: [phone]   Fax:  [phone]  Name: April [name] MRN: [id number] Date of Birth: 12-Jun-1955

## 2019-04-26 NOTE — Progress Notes (Signed)
PCCM: thanks for seeing her.  Garner Nash, DO [facility] 04/26/2019 5:48 PM

## 2019-05-01 ENCOUNTER — Other Ambulatory Visit: Payer: Self-pay

## 2019-05-01 ENCOUNTER — Ambulatory Visit: Payer: Medicare Other | Admitting: Physical Therapy

## 2019-05-01 ENCOUNTER — Encounter: Payer: Self-pay | Admitting: Physical Therapy

## 2019-05-01 DIAGNOSIS — M6281 Muscle weakness (generalized): Secondary | ICD-10-CM

## 2019-05-01 DIAGNOSIS — R296 Repeated falls: Secondary | ICD-10-CM

## 2019-05-01 DIAGNOSIS — M25672 Stiffness of left ankle, not elsewhere classified: Secondary | ICD-10-CM

## 2019-05-01 DIAGNOSIS — R2681 Unsteadiness on feet: Secondary | ICD-10-CM | POA: Diagnosis not present

## 2019-05-01 NOTE — Patient Instructions (Signed)
Access Code: [email or web address] URL: [email or web address] Date: 05/01/2019 Prepared by: Faustino Congress  Exercises Standing Balance with Eyes Closed on Foam - 2 x daily - 7 x weekly - 5 reps - 1 sets - 10-15 sec hold Standing Single Leg Stance with Unilateral Counter Support - 2 x daily - 7 x weekly - 3 reps - 1 sets - 10 sec hold Seated Ankle Eversion with Resistance - 2 x daily - 7 x weekly - 1 sets - 10 reps Romberg Stance with Head Nods on Foam Pad - 2 x daily - 7 x weekly - 1 reps - 1 sets - 10 rotations each way Standing Gastroc Stretch at Counter - 2 x daily - 7 x weekly - 1 sets - 3 reps - 30 sec hold

## 2019-05-01 NOTE — Therapy (Signed)
[facility] Physical Therapy [address] [place], [place], [address] Phone: [phone]   Fax:  [phone]  Physical Therapy Treatment  Patient Details  Name: April [name] MRN: [id number] Date of Birth: September 25, 1955 Referring Provider (PT): Garald Balding, MD   Encounter Date: 05/01/2019  PT End of Session - 05/01/19 1231    Visit Number  2    Number of Visits  6    Date for PT Re-Evaluation  05/29/19    PT Start Time  1150    PT Stop Time  1232    PT Time Calculation (min)  42 min    Activity Tolerance  Patient tolerated treatment well    Behavior During Therapy  [facility] for tasks assessed/performed       Past Medical History:  Diagnosis Date  . Anxiety   . Anxiety and depression    related to caring for mother during terminal illness  . Arthritis   . Asthma   . AVN (avascular necrosis of bone) (HCC)    hip and wrist  . Bronchitis    hx of  . COPD (chronic obstructive pulmonary disease) ([place])   . Depression   . Endometriosis   . FH: CAD (coronary artery disease)   . History of palpitations   . Hypothyroid   . Osteopenia    forteo through Dr. Estanislado Pandy (started 10/12)  . PMR (polymyalgia rheumatica) (HCC)   . SIRS (systemic inflammatory response syndrome) ([place]) 10/2017  . Smoker   . SOB (shortness of breath) on exertion   . Squamous cell carcinoma    facial, 2016  . Temporal arteritis (HCC)    s/p prednisone taper    Past Surgical History:  Procedure Laterality Date  . ABDOMINAL ADHESION SURGERY    . ABDOMINAL HYSTERECTOMY    . BREAST EXCISIONAL BIOPSY    . BREAST SURGERY Left    benign bx 1990  . CARDIAC CATHETERIZATION  2007   no PCI  . CHOLECYSTECTOMY    . COLONOSCOPY W/ POLYPECTOMY    . INCONTINENCE SURGERY     2007   . JOINT REPLACEMENT Left 2012   left hip  . TONSILLECTOMY    . TOTAL HIP ARTHROPLASTY     L hip 2012  . TOTAL HIP ARTHROPLASTY Right 10/04/2012   Procedure: TOTAL HIP ARTHROPLASTY;  Surgeon: Garald Balding, MD;  Location: [facility];  Service: Orthopedics;  Laterality: Right;  . WRIST SURGERY     2012/ left wrist/ bone removed due to necrosis    There were no vitals filed for this visit.  Subjective Assessment - 05/01/19 1153    Subjective  "I don't think the exercises are working.  I still fall backwards with my eyes closed."  Does feel that she isn't as off balance and relays no falls.    Pertinent History  Anxiety, hypertension, degenerative disc disease, polymyalgia rheumatica, chronic kidney disease, GERD    Limitations  Standing;Walking    Patient Stated Goals  1-2 sessions for HEP to work on strengthening    Currently in Pain?  No/denies                       [facility] Adult PT Treatment/Exercise - 05/01/19 1154      Ankle Exercises: Aerobic   Nustep  L7 x 8 min      Ankle Exercises: Stretches   Slant Board Stretch  3 reps;30 seconds      Ankle Exercises: Seated  Other Seated Ankle Exercises  eversion with L2 band x 20 reps; Lt          Balance Exercises - 05/01/19 1214      Balance Exercises: Standing   Standing Eyes Opened  Foam/compliant surface;Narrow base of support (BOS);Head turns    Standing Eyes Closed  Wide (BOA);Foam/compliant surface;5 reps;10 secs    SLS  5 reps;10 secs;Upper extremity support 1;Solid surface;Eyes open        PT Education - 05/01/19 1223    Education Details  HEP    Person(s) Educated  Patient    Methods  Explanation;Demonstration;Handout    Comprehension  Verbalized understanding;Returned demonstration;Need further instruction          PT Long Term Goals - 04/17/19 1129      PT LONG TERM GOAL #1   Title  independent with HEP    Status  New    Target Date  05/29/19      PT LONG TERM GOAL #2   Title  report no episodes of falling for improved balance and mobility    Status  New    Target Date  05/29/19      PT LONG TERM GOAL #3   Title  demonstrate improved balance by standing with EC on foam for at  least 15 sec without LOB    Status  New    Target Date  05/29/19      PT LONG TERM GOAL #4   Title  improve Lt ankle strength to at least 4/5 for improved function    Status  New    Target Date  05/29/19      PT LONG TERM GOAL #5   Title  improve Lt ankle dorsiflexion active to at least neutral for improved flexibility and motion    Status  New    Target Date  05/29/19            Plan - 05/01/19 1231    Clinical Impression Statement  Pt compliant with HEP and added 2 additional exercises today.  Reports no falls and feeling more steady overall today.  Will continue to benefit from PT to maximize function and decrease fall risk.  No goals met as only 2nd visit.    Personal Factors and Comorbidities  Comorbidity 3+    Comorbidities  Anxiety, hypertension, degenerative disc disease, polymyalgia rheumatica, chronic kidney disease, GERD    Examination-Activity Limitations  Stairs;Stand;Lift;Locomotion Level    Examination-Participation Restrictions  Yard Work;Community Activity    Stability/Clinical Decision Making  Evolving/Moderate complexity    Rehab Potential  Good    PT Frequency  1x / week    PT Duration  6 weeks   anticipate only needing 2-3 sessions   PT Treatment/Interventions  ADLs/Self Care Home Management;Cryotherapy;Electrical Stimulation;Moist Heat;Balance training;Therapeutic exercise;Therapeutic activities;Functional mobility training;Stair training;Gait training;Neuromuscular re-education;Patient/family education;Vestibular;Taping;Dry needling;Canalith Repostioning    PT Next Visit Plan  review HEP, add gastroc stretch; progress balance exercises and ankle strengthening exercises    PT Home Exercise Plan  Access Code: [email or web address]    Consulted and Agree with Plan of Care  Patient       Patient will benefit from skilled therapeutic intervention in order to improve the following deficits and impairments:  Decreased strength, Difficulty walking, Decreased mobility,  Decreased balance, Decreased range of motion, Impaired flexibility, Pain  Visit Diagnosis: Stiffness of left ankle, not elsewhere classified  Muscle weakness (generalized)  Unsteadiness on feet  Repeated falls     Problem List Patient  Active Problem List   Diagnosis Date Noted  . Allergic rhinitis 04/06/2019  . Pain in left ankle and joints of left foot 03/30/2019  . Medicare welcome exam 08/29/2018  . Aortic atherosclerosis ([place]) 12/17/2017  . Pulmonary nodule 12/17/2017  . Muscle spasm 12/13/2017  . GERD (gastroesophageal reflux disease) 12/12/2017  . CAP (community acquired pneumonia) 11/24/2017  . Left-sided chest wall pain 11/24/2017  . CKD (chronic kidney disease), stage III 10/28/2017  . Dizziness 10/27/2017  . Headache 10/27/2017  . Creatinine elevation 08/26/2017  . SOB (shortness of breath) 08/26/2017  . Tachycardia 06/29/2017  . HTN (hypertension) 06/23/2017  . Polymyalgia rheumatica ([place]) 05/22/2016  . History of bilateral hip replacements 05/22/2016  . DDD (degenerative disc disease), lumbar 05/22/2016  . BPV (benign positional vertigo) 05/20/2016  . Colon cancer screening 08/20/2015  . Advance care planning 08/14/2014  . Vitamin D deficiency 08/14/2014  . COPD (chronic obstructive pulmonary disease) ([place]) 06/04/2014  . Avascular necrosis of bone of right hip ([place]) 10/06/2012  . Routine general medical examination at a health care facility 04/07/2011  . Osteopenia 06/09/2010  . AVN (avascular necrosis of bone) ([place]) 05/16/2010  . Asthma 03/23/2010  . Osteoarthritis 03/23/2010  . Hypothyroidism 03/21/2010  . ANXIETY DEPRESSION 03/21/2010  . Former smoker 03/21/2010  . SKIN LESION 03/21/2010  . TEMPORAL ARTERITIS 03/21/2010      Laureen Abrahams, PT, DPT 05/01/19 12:41 PM     [facility] Physical Therapy [address] [place], [place], [address] Phone: [phone]   Fax:  [phone]  Name: April [name] MRN: [id number] Date of Birth: 04/08/1955

## 2019-05-08 ENCOUNTER — Other Ambulatory Visit: Payer: Self-pay

## 2019-05-08 ENCOUNTER — Encounter: Payer: Self-pay | Admitting: Physical Therapy

## 2019-05-08 ENCOUNTER — Ambulatory Visit (INDEPENDENT_AMBULATORY_CARE_PROVIDER_SITE_OTHER): Payer: Medicare Other | Admitting: Physical Therapy

## 2019-05-08 DIAGNOSIS — R2681 Unsteadiness on feet: Secondary | ICD-10-CM | POA: Diagnosis not present

## 2019-05-08 DIAGNOSIS — M25672 Stiffness of left ankle, not elsewhere classified: Secondary | ICD-10-CM | POA: Diagnosis not present

## 2019-05-08 DIAGNOSIS — M6281 Muscle weakness (generalized): Secondary | ICD-10-CM | POA: Diagnosis not present

## 2019-05-08 DIAGNOSIS — R296 Repeated falls: Secondary | ICD-10-CM

## 2019-05-08 NOTE — Therapy (Addendum)
[facility] Physical Therapy [address] [place], [place], [address] Phone: [phone]   Fax:  [phone]  Physical Therapy Treatment/Discharge Summary  Patient Details  Name: April [name] MRN: [id number] Date of Birth: 1955-03-29 Referring Provider (PT): Garald Balding, MD   Encounter Date: 05/08/2019  PT End of Session - 05/08/19 1102    Visit Number  3    Number of Visits  6    Date for PT Re-Evaluation  05/29/19    PT Start Time  1018    PT Stop Time  1100    PT Time Calculation (min)  42 min    Activity Tolerance  Patient tolerated treatment well    Behavior During Therapy  [facility] for tasks assessed/performed       Past Medical History:  Diagnosis Date  . Anxiety   . Anxiety and depression    related to caring for mother during terminal illness  . Arthritis   . Asthma   . AVN (avascular necrosis of bone) (HCC)    hip and wrist  . Bronchitis    hx of  . COPD (chronic obstructive pulmonary disease) ([place])   . Depression   . Endometriosis   . FH: CAD (coronary artery disease)   . History of palpitations   . Hypothyroid   . Osteopenia    forteo through Dr. Estanislado Pandy (started 10/12)  . PMR (polymyalgia rheumatica) (HCC)   . SIRS (systemic inflammatory response syndrome) ([place]) 10/2017  . Smoker   . SOB (shortness of breath) on exertion   . Squamous cell carcinoma    facial, 2016  . Temporal arteritis (HCC)    s/p prednisone taper    Past Surgical History:  Procedure Laterality Date  . ABDOMINAL ADHESION SURGERY    . ABDOMINAL HYSTERECTOMY    . BREAST EXCISIONAL BIOPSY    . BREAST SURGERY Left    benign bx 1990  . CARDIAC CATHETERIZATION  2007   no PCI  . CHOLECYSTECTOMY    . COLONOSCOPY W/ POLYPECTOMY    . INCONTINENCE SURGERY     2007   . JOINT REPLACEMENT Left 2012   left hip  . TONSILLECTOMY    . TOTAL HIP ARTHROPLASTY     L hip 2012  . TOTAL HIP ARTHROPLASTY Right 10/04/2012   Procedure: TOTAL HIP ARTHROPLASTY;   Surgeon: Garald Balding, MD;  Location: [place];  Service: Orthopedics;  Laterality: Right;  . WRIST SURGERY     2012/ left wrist/ bone removed due to necrosis    There were no vitals filed for this visit.  Subjective Assessment - 05/08/19 1020    Subjective  feels like she's doing better, "I think I can catch myself now."    Pertinent History  Anxiety, hypertension, degenerative disc disease, polymyalgia rheumatica, chronic kidney disease, GERD    Limitations  Standing;Walking    Patient Stated Goals  1-2 sessions for HEP to work on strengthening    Currently in Pain?  No/denies                       [facility] Adult PT Treatment/Exercise - 05/08/19 1020      Ankle Exercises: Aerobic   Nustep  L7 x 8 min      Ankle Exercises: Stretches   Slant Board Stretch  3 reps;30 seconds          Balance Exercises - 05/08/19 1029      Balance Exercises: Standing   Tandem  Stance  Eyes open;Upper extremity support 1;5 reps;15 secs   bil   Step Ups  Lateral;4 inch;UE support 1   crossover step   Gait with Head Turns  Forward;3 reps   horizontal/vertical; minguard A   Tandem Gait  Intermittent upper extremity support;Forward;3 reps    Other Standing Exercises  heel/toe walk x 3 laps at counter with intermittent UE support and minguard A             PT Long Term Goals - 04/17/19 1129      PT LONG TERM GOAL #1   Title  independent with HEP    Status  New    Target Date  05/29/19      PT LONG TERM GOAL #2   Title  report no episodes of falling for improved balance and mobility    Status  New    Target Date  05/29/19      PT LONG TERM GOAL #3   Title  demonstrate improved balance by standing with EC on foam for at least 15 sec without LOB    Status  New    Target Date  05/29/19      PT LONG TERM GOAL #4   Title  improve Lt ankle strength to at least 4/5 for improved function    Status  New    Target Date  05/29/19      PT LONG TERM GOAL #5   Title   improve Lt ankle dorsiflexion active to at least neutral for improved flexibility and motion    Status  New    Target Date  05/29/19            Plan - 05/08/19 1102    Clinical Impression Statement  Pt demonstrates improvement in balance with repetition with exercises, and needs UE support and min to minguard A with all dynamic activities.  Reports improvement in stability and overall progressing well.  Will plan to look at goals next visit.    Personal Factors and Comorbidities  Comorbidity 3+    Comorbidities  Anxiety, hypertension, degenerative disc disease, polymyalgia rheumatica, chronic kidney disease, GERD    Examination-Activity Limitations  Stairs;Stand;Lift;Locomotion Level    Examination-Participation Restrictions  Yard Work;Community Activity    Stability/Clinical Decision Making  Evolving/Moderate complexity    Rehab Potential  Good    PT Frequency  1x / week    PT Duration  6 weeks   anticipate only needing 2-3 sessions   PT Treatment/Interventions  ADLs/Self Care Home Management;Cryotherapy;Electrical Stimulation;Moist Heat;Balance training;Therapeutic exercise;Therapeutic activities;Functional mobility training;Stair training;Gait training;Neuromuscular re-education;Patient/family education;Vestibular;Taping;Dry needling;Canalith Repostioning    PT Next Visit Plan  progress balance exercises and ankle strengthening exercises, look at goals, see how she's doing (?d/c)    PT Home Exercise Plan  Access Code: [email or web address]    Consulted and Agree with Plan of Care  Patient       Patient will benefit from skilled therapeutic intervention in order to improve the following deficits and impairments:  Decreased strength, Difficulty walking, Decreased mobility, Decreased balance, Decreased range of motion, Impaired flexibility, Pain  Visit Diagnosis: Stiffness of left ankle, not elsewhere classified  Muscle weakness (generalized)  Unsteadiness on feet  Repeated  falls     Problem List Patient Active Problem List   Diagnosis Date Noted  . Allergic rhinitis 04/06/2019  . Pain in left ankle and joints of left foot 03/30/2019  . Medicare welcome exam 08/29/2018  . Aortic atherosclerosis ([place]) 12/17/2017  . Pulmonary nodule  12/17/2017  . Muscle spasm 12/13/2017  . GERD (gastroesophageal reflux disease) 12/12/2017  . CAP (community acquired pneumonia) 11/24/2017  . Left-sided chest wall pain 11/24/2017  . CKD (chronic kidney disease), stage III 10/28/2017  . Dizziness 10/27/2017  . Headache 10/27/2017  . Creatinine elevation 08/26/2017  . SOB (shortness of breath) 08/26/2017  . Tachycardia 06/29/2017  . HTN (hypertension) 06/23/2017  . Polymyalgia rheumatica ([place]) 05/22/2016  . History of bilateral hip replacements 05/22/2016  . DDD (degenerative disc disease), lumbar 05/22/2016  . BPV (benign positional vertigo) 05/20/2016  . Colon cancer screening 08/20/2015  . Advance care planning 08/14/2014  . Vitamin D deficiency 08/14/2014  . COPD (chronic obstructive pulmonary disease) ([place]) 06/04/2014  . Avascular necrosis of bone of right hip ([place]) 10/06/2012  . Routine general medical examination at a health care facility 04/07/2011  . Osteopenia 06/09/2010  . AVN (avascular necrosis of bone) ([place]) 05/16/2010  . Asthma 03/23/2010  . Osteoarthritis 03/23/2010  . Hypothyroidism 03/21/2010  . ANXIETY DEPRESSION 03/21/2010  . Former smoker 03/21/2010  . SKIN LESION 03/21/2010  . TEMPORAL ARTERITIS 03/21/2010     Laureen Abrahams, PT, DPT 05/08/19 11:05 AM     [facility] Physical Therapy [address] [place], [place], [address] Phone: [phone]   Fax:  [phone]  Name: Lousie Calico MRN: [id number] Date of Birth: 02-22-1955      PHYSICAL THERAPY DISCHARGE SUMMARY  Visits from Start of Care: 3  Current functional level related to goals / functional outcomes: See above   Remaining  deficits: See above   Education / Equipment: HEP  Plan: Patient agrees to discharge.  Patient goals were not met. Patient is being discharged due to not returning since the last visit.  ?????     Laureen Abrahams, PT, DPT 08/15/19 9:01 AM  [facility] Physical Therapy [address] [place], [place], [address] Phone: [phone]   Fax:  [phone]

## 2019-05-10 ENCOUNTER — Ambulatory Visit (INDEPENDENT_AMBULATORY_CARE_PROVIDER_SITE_OTHER): Payer: Medicare Other | Admitting: Plastic Surgery

## 2019-05-10 ENCOUNTER — Other Ambulatory Visit: Payer: Self-pay

## 2019-05-10 ENCOUNTER — Encounter: Payer: Self-pay | Admitting: Plastic Surgery

## 2019-05-10 VITALS — BP 125/77 | HR 85 | Temp 98.4°F | Ht 66.5 in | Wt 180.0 lb

## 2019-05-10 DIAGNOSIS — M546 Pain in thoracic spine: Secondary | ICD-10-CM | POA: Diagnosis not present

## 2019-05-10 DIAGNOSIS — N62 Hypertrophy of breast: Secondary | ICD-10-CM | POA: Diagnosis not present

## 2019-05-10 DIAGNOSIS — M545 Low back pain, unspecified: Secondary | ICD-10-CM

## 2019-05-10 DIAGNOSIS — M4004 Postural kyphosis, thoracic region: Secondary | ICD-10-CM | POA: Diagnosis not present
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
## 2019-05-11 ENCOUNTER — Telehealth: Payer: Self-pay | Admitting: Family Medicine

## 2019-05-11 NOTE — Telephone Encounter (Signed)
Note reviewed from plastic surgery clinic.  She is going to need PCP preop clearance.  Please set up 30-minute preop visit when possible in the next few weeks.  Thanks.

## 2019-05-12 ENCOUNTER — Telehealth: Payer: Self-pay | Admitting: Family Medicine

## 2019-05-12 NOTE — Telephone Encounter (Signed)
Called patient and left voicemail for patient to call office to get scheduled for 30 minute pre-op appointment.

## 2019-05-19 NOTE — Telephone Encounter (Signed)
Pt called back and scheduled to see Dr. Damita Dunnings on 5/27

## 2019-05-23 ENCOUNTER — Encounter: Payer: Self-pay | Admitting: Physical Therapy

## 2019-05-29 ENCOUNTER — Telehealth: Payer: Self-pay | Admitting: Pulmonary Disease

## 2019-05-29 DIAGNOSIS — J45909 Unspecified asthma, uncomplicated: Secondary | ICD-10-CM

## 2019-05-29 NOTE — Telephone Encounter (Signed)
Yes ok to give spacer. We have spacers in the sample closet, if not then can send order to DME.   [name] FNP

## 2019-05-29 NOTE — Telephone Encounter (Addendum)
Spoke with pt, she states she was supposed to get a spacer for her inhaler. We can leave one up front for her. She also asked about pulmonary rehab, I don't see a referral in the chart but Aaron Edelman wrote in his note that he wanted to order this for pt. Order placed.   Aaron Edelman can you sign for a spacer? She was supposed to get one but had a telephone visit and pt was not in the office.

## 2019-05-29 NOTE — Telephone Encounter (Signed)
Spoke with patient. She is aware that I will leave a spacer for her at the front desk.   Nothing further needed at time of call.

## 2019-05-31 ENCOUNTER — Encounter (HOSPITAL_COMMUNITY): Payer: Self-pay | Admitting: *Deleted

## 2019-05-31 ENCOUNTER — Other Ambulatory Visit: Payer: Self-pay

## 2019-05-31 DIAGNOSIS — N62 Hypertrophy of breast: Secondary | ICD-10-CM | POA: Insufficient documentation

## 2019-05-31 NOTE — Patient Instructions (Signed)
Activity As tolerated: NO showers for 3 days No heavy activities  Diet: Regular - Focus on eating high vegetables and good lean protein for optimal wound healing  Wound Care: Keep dressing clean & dry for 3 days.  Keep wrap applied with compression as much as possible 24/7.    Do not change dressings for 3 days unless soiled.  Can change if needed but make sure to reapply wrap. After three days can remove wrap and shower.  Then reapply dressings if needed and continue compression with wrap or soft sports bra. Call doctor if any unusual problems occur such as pain, excessive bleeding, unrelieved nausea/vomiting, fever &/or chills  Follow-up appointment: Scheduled for June 16.   Pain Management Plan: 1) Ibuprofen 800 mg every 8 hours          If still having pain... Add 2) Tylenol 500 mg every 8 hours    If still in severe pain... Add 3) Rx pain medication         Recommend @ night         May use up to every 8 hours if needed for severe pain  You may alternate between Ibuprofen and Tylenol taking one every 4 hours if desired. Mat apply ice - avoid nipples and incisions
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
## 2019-05-31 NOTE — Progress Notes (Signed)
Received referral from  Dr. Valeta Harms  for this pt to participate in pulmonary rehab with the the diagnosis of Asthma . Clinical review of pt follow up appt on 04/06/19 with [name] NP with Dr. Arcola Jansky office note.  Pt with Covid Risk Score - 4. Pt appropriate for scheduling for Pulmonary rehab once she has recovered from her mammary reduction scheduled for 6/8.  Per office note with Plastic Surgeon should be about a month recovery.  Will forward to support staff for verification of insurance eligibility/benefit and pulmonary rehab staff for scheduling once appropriate to proceed.  with pt consent. Cherre Huger, BSN Cardiac and Training and development officer
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
## 2019-06-01 ENCOUNTER — Encounter: Payer: Self-pay | Admitting: Family Medicine

## 2019-06-01 ENCOUNTER — Encounter: Payer: Self-pay | Admitting: Plastic Surgery

## 2019-06-01 ENCOUNTER — Ambulatory Visit (INDEPENDENT_AMBULATORY_CARE_PROVIDER_SITE_OTHER): Payer: Medicare Other | Admitting: Family Medicine

## 2019-06-01 ENCOUNTER — Other Ambulatory Visit: Payer: Self-pay

## 2019-06-01 ENCOUNTER — Ambulatory Visit (INDEPENDENT_AMBULATORY_CARE_PROVIDER_SITE_OTHER): Payer: Medicare Other | Admitting: Plastic Surgery

## 2019-06-01 DIAGNOSIS — N62 Hypertrophy of breast: Secondary | ICD-10-CM

## 2019-06-01 MED ORDER — HYDROCODONE-ACETAMINOPHEN 5-325 MG PO TABS: 1.0000 | ORAL_TABLET | Freq: Three times a day (TID) | ORAL | 0 refills | Status: AC | PRN

## 2019-06-01 MED ORDER — ONDANSETRON HCL 4 MG PO TABS: 4.0000 mg | ORAL_TABLET | Freq: Three times a day (TID) | ORAL | 0 refills | Status: DC | PRN

## 2019-06-02 ENCOUNTER — Telehealth: Payer: Self-pay | Admitting: *Deleted

## 2019-06-02 ENCOUNTER — Other Ambulatory Visit: Payer: Self-pay | Admitting: Pulmonary Disease

## 2019-06-02 MED ORDER — BREZTRI AEROSPHERE 160-9-4.8 MCG/ACT IN AERO: 2.0000 | INHALATION_SPRAY | Freq: Two times a day (BID) | RESPIRATORY_TRACT | 5 refills | Status: DC

## 2019-06-02 NOTE — Telephone Encounter (Signed)
Faxed Request for Surgical Clearance to patient's PCP:Dr. Elsie Stain @ [phone]) @ [facility].  Confirmation received.    Awaiting for response.

## 2019-06-05 NOTE — Progress Notes (Signed)
This visit occurred during the SARS-CoV-2 public health emergency.  Safety protocols were in place, including screening questions prior to the visit, additional usage of staff PPE, and extensive cleaning of exam room while observing appropriate contact time as indicated for disinfecting solutions.  Patient scheduled for breast reduction.  She is having back pain related to macromastia.  We talked about her preop considerations.  We can never completely "clear" a patient for surgery but we can talk about the relative risk and make sure the patient is appropriately low risk for surgery.  She understood that.  She agreed.  She has a history of asthma/COPD but is doing well and only using albuterol a few times a week, usually with an increase in humidity.  She still on her baseline medications and compliant.  She not have any fevers chills nausea vomiting or diarrhea.  No chest pain and she does not have any new shortness of breath.  No history of MI or stroke.  She has tolerated surgery and anesthesia previously.  Meds, vitals, and allergies reviewed.   ROS: Per HPI unless specifically indicated in ROS section   GEN: nad, alert and oriented HEENT: ncat NECK: supple w/o LA CV: rrr PULM: ctab, no inc wob ABD: soft, +bs EXT: no edema SKIN: no acute rash

## 2019-06-05 NOTE — Assessment & Plan Note (Signed)
It does appear that the patient is appropriately low risk to proceed with surgery.  See above.  It is likely the case that the benefit of surgery greatly outweighs the risk.  She understood the risk of cardiovascular events, etc. associated with surgery but wants to proceed given her current difficulties.  I think this makes sense.  It does not appear that she needs any further preoperative testing.  I appreciate the help of all involved.

## 2019-06-06 ENCOUNTER — Encounter: Payer: Self-pay | Admitting: Family Medicine

## 2019-06-06 ENCOUNTER — Telehealth (HOSPITAL_COMMUNITY): Payer: Self-pay

## 2019-06-06 NOTE — Telephone Encounter (Signed)
Pt insurance is active and benefits verified through [facility] Medicare Co-pay $20, DED 0/0 met, out of pocket $3,600/$249.06 met, co-insurance 0%. no pre-authorization required. Andrea/[employer] @ 9:21am, REF# [id number]

## 2019-06-06 NOTE — Progress Notes (Signed)
Chart reviewed with Dr. Valma Cava and per Anesthesia guidelines, patient is not a candidate for outpatient surgery at [facility] and will need to be moved to the main OR. Shayna at Dr. Keane Scrape office notified.

## 2019-06-07 NOTE — Telephone Encounter (Signed)
Received response back for Surgical Clearance from Dr. Phillip Heal Duncan:Patient medically appropriately low risk.  No medicine to be held prior to surgery.    Copy emailed to [facility] and copy scanned into the chart.//AB/CMA

## 2019-06-08 ENCOUNTER — Telehealth (HOSPITAL_COMMUNITY): Payer: Self-pay

## 2019-06-08 ENCOUNTER — Other Ambulatory Visit (HOSPITAL_COMMUNITY)
Admission: RE | Admit: 2019-06-08 | Discharge: 2019-06-08 | Disposition: A | Discharge status: Home / Self Care | Payer: Medicare Other | Source: Ambulatory Visit | Attending: Plastic Surgery | Admitting: Plastic Surgery

## 2019-06-08 DIAGNOSIS — Z01812 Encounter for preprocedural laboratory examination: Secondary | ICD-10-CM | POA: Diagnosis not present

## 2019-06-08 DIAGNOSIS — Z20822 Contact with and (suspected) exposure to covid-19: Secondary | ICD-10-CM | POA: Insufficient documentation

## 2019-06-08 LAB — SARS CORONAVIRUS 2 (TAT 6-24 HRS): SARS Coronavirus 2: NEGATIVE

## 2019-06-09 ENCOUNTER — Other Ambulatory Visit (HOSPITAL_COMMUNITY): Payer: Medicare Other

## 2019-06-12 ENCOUNTER — Encounter (HOSPITAL_COMMUNITY): Payer: Self-pay | Admitting: Plastic Surgery

## 2019-06-12 NOTE — Progress Notes (Addendum)
Ms April [name]  denies chest pain or shortness of breath. Patient tested negative for Covid_6/03/2019 and has been in quarantine since that time.  I instructed patient to bring Albuterol inhaler to the hospital with her.

## 2019-06-13 ENCOUNTER — Ambulatory Visit (HOSPITAL_COMMUNITY): Payer: Medicare Other | Admitting: Certified Registered Nurse Anesthetist

## 2019-06-13 ENCOUNTER — Ambulatory Visit (HOSPITAL_COMMUNITY)
Admission: RE | Admit: 2019-06-13 | Discharge: 2019-06-13 | Disposition: A | Discharge status: Home / Self Care | Payer: Medicare Other | Attending: Plastic Surgery | Admitting: Plastic Surgery

## 2019-06-13 ENCOUNTER — Encounter (HOSPITAL_COMMUNITY): Payer: Self-pay | Admitting: Plastic Surgery

## 2019-06-13 ENCOUNTER — Other Ambulatory Visit: Payer: Self-pay

## 2019-06-13 ENCOUNTER — Encounter (HOSPITAL_COMMUNITY)
Admission: RE | Disposition: A | Discharge status: Home / Self Care | Payer: Self-pay | Source: Home / Self Care | Attending: Plastic Surgery

## 2019-06-13 DIAGNOSIS — M542 Cervicalgia: Secondary | ICD-10-CM | POA: Insufficient documentation

## 2019-06-13 DIAGNOSIS — Z885 Allergy status to narcotic agent status: Secondary | ICD-10-CM | POA: Insufficient documentation

## 2019-06-13 DIAGNOSIS — J449 Chronic obstructive pulmonary disease, unspecified: Secondary | ICD-10-CM | POA: Diagnosis not present

## 2019-06-13 DIAGNOSIS — Z79890 Hormone replacement therapy (postmenopausal): Secondary | ICD-10-CM | POA: Diagnosis not present

## 2019-06-13 DIAGNOSIS — I129 Hypertensive chronic kidney disease with stage 1 through stage 4 chronic kidney disease, or unspecified chronic kidney disease: Secondary | ICD-10-CM | POA: Diagnosis not present

## 2019-06-13 DIAGNOSIS — Z87891 Personal history of nicotine dependence: Secondary | ICD-10-CM | POA: Insufficient documentation

## 2019-06-13 DIAGNOSIS — F329 Major depressive disorder, single episode, unspecified: Secondary | ICD-10-CM | POA: Diagnosis not present

## 2019-06-13 DIAGNOSIS — M546 Pain in thoracic spine: Secondary | ICD-10-CM | POA: Diagnosis not present

## 2019-06-13 DIAGNOSIS — F419 Anxiety disorder, unspecified: Secondary | ICD-10-CM | POA: Insufficient documentation

## 2019-06-13 DIAGNOSIS — Z8261 Family history of arthritis: Secondary | ICD-10-CM | POA: Diagnosis not present

## 2019-06-13 DIAGNOSIS — M549 Dorsalgia, unspecified: Secondary | ICD-10-CM | POA: Diagnosis not present

## 2019-06-13 DIAGNOSIS — N62 Hypertrophy of breast: Secondary | ICD-10-CM | POA: Insufficient documentation

## 2019-06-13 DIAGNOSIS — Z8249 Family history of ischemic heart disease and other diseases of the circulatory system: Secondary | ICD-10-CM | POA: Insufficient documentation

## 2019-06-13 DIAGNOSIS — M545 Low back pain: Secondary | ICD-10-CM | POA: Diagnosis not present

## 2019-06-13 DIAGNOSIS — Z96643 Presence of artificial hip joint, bilateral: Secondary | ICD-10-CM | POA: Insufficient documentation

## 2019-06-13 DIAGNOSIS — M1990 Unspecified osteoarthritis, unspecified site: Secondary | ICD-10-CM | POA: Insufficient documentation

## 2019-06-13 DIAGNOSIS — N183 Chronic kidney disease, stage 3 unspecified: Secondary | ICD-10-CM | POA: Diagnosis not present

## 2019-06-13 DIAGNOSIS — E039 Hypothyroidism, unspecified: Secondary | ICD-10-CM | POA: Diagnosis not present

## 2019-06-13 DIAGNOSIS — Z85828 Personal history of other malignant neoplasm of skin: Secondary | ICD-10-CM | POA: Insufficient documentation

## 2019-06-13 DIAGNOSIS — Z882 Allergy status to sulfonamides status: Secondary | ICD-10-CM | POA: Insufficient documentation

## 2019-06-13 DIAGNOSIS — Z79899 Other long term (current) drug therapy: Secondary | ICD-10-CM | POA: Insufficient documentation

## 2019-06-13 DIAGNOSIS — I10 Essential (primary) hypertension: Secondary | ICD-10-CM | POA: Diagnosis not present

## 2019-06-13 DIAGNOSIS — M4004 Postural kyphosis, thoracic region: Secondary | ICD-10-CM | POA: Diagnosis not present

## 2019-06-13 HISTORY — DX: Gastro-esophageal reflux disease without esophagitis: K21.9

## 2019-06-13 HISTORY — DX: Essential (primary) hypertension: I10

## 2019-06-13 HISTORY — DX: Personal history of other medical treatment: Z92.89

## 2019-06-13 HISTORY — PX: BREAST REDUCTION SURGERY: SHX8

## 2019-06-13 LAB — BASIC METABOLIC PANEL
Anion gap: 12 (ref 5–15)
BUN: 24 mg/dL — ABNORMAL HIGH (ref 8–23)
CO2: 22 mmol/L (ref 22–32)
Calcium: 9.4 mg/dL (ref 8.9–10.3)
Chloride: 106 mmol/L (ref 98–111)
Creatinine, Ser: 1.3 mg/dL — ABNORMAL HIGH (ref 0.44–1.00)
GFR calc Af Amer: 50 mL/min — ABNORMAL LOW (ref >60)
GFR calc non Af Amer: 43 mL/min — ABNORMAL LOW (ref >60)
Glucose, Bld: 98 mg/dL (ref 70–99)
Potassium: 4.1 mmol/L (ref 3.5–5.1)
Sodium: 140 mmol/L (ref 135–145)

## 2019-06-13 LAB — CBC
HCT: 41.3 % (ref 36.0–46.0)
Hemoglobin: 13.1 g/dL (ref 12.0–15.0)
MCH: 30.8 pg (ref 26.0–34.0)
MCHC: 31.7 g/dL (ref 30.0–36.0)
MCV: 97.2 fL (ref 80.0–100.0)
Platelets: 189 10*3/uL (ref 150–400)
RBC: 4.25 MIL/uL (ref 3.87–5.11)
RDW: 12.8 % (ref 11.5–15.5)
WBC: 6.7 10*3/uL (ref 4.0–10.5)
nRBC: 0 % (ref 0.0–0.2)

## 2019-06-13 SURGERY — MAMMOPLASTY, REDUCTION
Anesthesia: General | Site: Breast | Laterality: Bilateral

## 2019-06-13 MED ORDER — PROPOFOL 500 MG/50ML IV EMUL
INTRAVENOUS | Status: DC | PRN
Start: 2019-06-13 — End: 2019-06-13
  Administered 2019-06-13: 15 ug/kg/min via INTRAVENOUS

## 2019-06-13 MED ORDER — ORAL CARE MOUTH RINSE: 15.0000 mL | Freq: Once | OROMUCOSAL | Status: AC

## 2019-06-13 MED ORDER — MIDAZOLAM HCL 5 MG/5ML IJ SOLN
INTRAMUSCULAR | Status: DC | PRN
  Administered 2019-06-13: 2 mg via INTRAVENOUS

## 2019-06-13 MED ORDER — LACTATED RINGERS IV SOLN: INTRAVENOUS | Status: DC | PRN

## 2019-06-13 MED ORDER — FENTANYL CITRATE (PF) 100 MCG/2ML IJ SOLN
INTRAMUSCULAR | Status: AC
  Filled 2019-06-13: qty 2

## 2019-06-13 MED ORDER — ONDANSETRON HCL 4 MG/2ML IJ SOLN
INTRAMUSCULAR | Status: AC
  Filled 2019-06-13: qty 2

## 2019-06-13 MED ORDER — MIDAZOLAM HCL 2 MG/2ML IJ SOLN
INTRAMUSCULAR | Status: AC
  Filled 2019-06-13: qty 2

## 2019-06-13 MED ORDER — ONDANSETRON HCL 4 MG/2ML IJ SOLN
INTRAMUSCULAR | Status: DC | PRN
  Administered 2019-06-13: 4 mg via INTRAVENOUS

## 2019-06-13 MED ORDER — CEFAZOLIN SODIUM-DEXTROSE 2-4 GM/100ML-% IV SOLN
2.0000 g | INTRAVENOUS | Status: AC
  Administered 2019-06-13: 2 g via INTRAVENOUS

## 2019-06-13 MED ORDER — FENTANYL CITRATE (PF) 100 MCG/2ML IJ SOLN
25.0000 ug | INTRAMUSCULAR | Status: DC | PRN
  Administered 2019-06-13: 25 ug via INTRAVENOUS

## 2019-06-13 MED ORDER — BUPIVACAINE HCL 0.25 % IJ SOLN
INTRAMUSCULAR | Status: DC | PRN
  Administered 2019-06-13: 30 mL

## 2019-06-13 MED ORDER — EPHEDRINE 5 MG/ML INJ
INTRAVENOUS | Status: AC
  Filled 2019-06-13: qty 10

## 2019-06-13 MED ORDER — PROPOFOL 10 MG/ML IV BOLUS
INTRAVENOUS | Status: DC | PRN
  Administered 2019-06-13: 120 mg via INTRAVENOUS

## 2019-06-13 MED ORDER — EPINEPHRINE PF 1 MG/ML IJ SOLN
INTRAMUSCULAR | Status: AC
  Filled 2019-06-13: qty 1

## 2019-06-13 MED ORDER — ACETAMINOPHEN 500 MG PO TABS
ORAL_TABLET | ORAL | Status: AC
  Administered 2019-06-13: 1000 mg via ORAL
  Filled 2019-06-13: qty 2

## 2019-06-13 MED ORDER — ROCURONIUM BROMIDE 10 MG/ML (PF) SYRINGE
PREFILLED_SYRINGE | INTRAVENOUS | Status: DC | PRN
  Administered 2019-06-13: 60 mg via INTRAVENOUS

## 2019-06-13 MED ORDER — SCOPOLAMINE 1 MG/3DAYS TD PT72: 1.0000 | MEDICATED_PATCH | Freq: Once | TRANSDERMAL | Status: DC

## 2019-06-13 MED ORDER — PHENYLEPHRINE 40 MCG/ML (10ML) SYRINGE FOR IV PUSH (FOR BLOOD PRESSURE SUPPORT)
PREFILLED_SYRINGE | INTRAVENOUS | Status: AC
  Filled 2019-06-13: qty 10

## 2019-06-13 MED ORDER — LIDOCAINE 2% (20 MG/ML) 5 ML SYRINGE
INTRAMUSCULAR | Status: AC
  Filled 2019-06-13: qty 5

## 2019-06-13 MED ORDER — FENTANYL CITRATE (PF) 100 MCG/2ML IJ SOLN
INTRAMUSCULAR | Status: DC | PRN
  Administered 2019-06-13: 100 ug via INTRAVENOUS
  Administered 2019-06-13: 50 ug via INTRAVENOUS

## 2019-06-13 MED ORDER — ACETAMINOPHEN 500 MG PO TABS: 1000.0000 mg | ORAL_TABLET | Freq: Once | ORAL | Status: AC

## 2019-06-13 MED ORDER — 0.9 % SODIUM CHLORIDE (POUR BTL) OPTIME
TOPICAL | Status: DC | PRN
  Administered 2019-06-13 (×2): 1000 mL

## 2019-06-13 MED ORDER — PHENYLEPHRINE HCL-NACL 10-0.9 MG/250ML-% IV SOLN
INTRAVENOUS | Status: DC | PRN
Start: 2019-06-13 — End: 2019-06-13
  Administered 2019-06-13: 25 ug/min via INTRAVENOUS

## 2019-06-13 MED ORDER — DIPHENHYDRAMINE HCL 50 MG/ML IJ SOLN
INTRAMUSCULAR | Status: AC
  Filled 2019-06-13: qty 1

## 2019-06-13 MED ORDER — FENTANYL CITRATE (PF) 250 MCG/5ML IJ SOLN
INTRAMUSCULAR | Status: AC
  Filled 2019-06-13: qty 5

## 2019-06-13 MED ORDER — BUPIVACAINE HCL (PF) 0.25 % IJ SOLN
INTRAMUSCULAR | Status: AC
  Filled 2019-06-13: qty 30

## 2019-06-13 MED ORDER — CHLORHEXIDINE GLUCONATE 0.12 % MT SOLN
OROMUCOSAL | Status: AC
  Administered 2019-06-13: 15 mL via OROMUCOSAL
  Filled 2019-06-13: qty 15

## 2019-06-13 MED ORDER — LIDOCAINE 2% (20 MG/ML) 5 ML SYRINGE
INTRAMUSCULAR | Status: DC | PRN
  Administered 2019-06-13: 60 mg via INTRAVENOUS

## 2019-06-13 MED ORDER — DEXAMETHASONE SODIUM PHOSPHATE 10 MG/ML IJ SOLN
INTRAMUSCULAR | Status: AC
  Filled 2019-06-13: qty 1

## 2019-06-13 MED ORDER — CEFAZOLIN SODIUM-DEXTROSE 2-4 GM/100ML-% IV SOLN
INTRAVENOUS | Status: AC
  Filled 2019-06-13: qty 100

## 2019-06-13 MED ORDER — DEXAMETHASONE SODIUM PHOSPHATE 10 MG/ML IJ SOLN
INTRAMUSCULAR | Status: DC | PRN
Start: 2019-06-13 — End: 2019-06-13
  Administered 2019-06-13: 5 mg via INTRAVENOUS

## 2019-06-13 MED ORDER — CHLORHEXIDINE GLUCONATE 0.12 % MT SOLN: 15.0000 mL | Freq: Once | OROMUCOSAL | Status: AC

## 2019-06-13 MED ORDER — ROCURONIUM BROMIDE 10 MG/ML (PF) SYRINGE
PREFILLED_SYRINGE | INTRAVENOUS | Status: AC
  Filled 2019-06-13: qty 10

## 2019-06-13 MED ORDER — DIPHENHYDRAMINE HCL 50 MG/ML IJ SOLN
INTRAMUSCULAR | Status: DC | PRN
  Administered 2019-06-13: 12.5 mg via INTRAVENOUS

## 2019-06-13 MED ORDER — ONDANSETRON HCL 4 MG/2ML IJ SOLN: 4.0000 mg | Freq: Once | INTRAMUSCULAR | Status: DC | PRN

## 2019-06-13 MED ORDER — EPINEPHRINE 0.1 MG/0.1ML IJ SOAJ
INTRAMUSCULAR | Status: DC | PRN
  Administered 2019-06-13: 1 mL

## 2019-06-13 MED ORDER — OXYCODONE HCL 5 MG PO TABS
5.0000 mg | ORAL_TABLET | Freq: Once | ORAL | Status: AC
  Administered 2019-06-13: 5 mg via ORAL

## 2019-06-13 MED ORDER — PROPOFOL 10 MG/ML IV BOLUS
INTRAVENOUS | Status: AC
  Filled 2019-06-13: qty 40

## 2019-06-13 MED ORDER — OXYCODONE HCL 5 MG PO TABS
ORAL_TABLET | ORAL | Status: AC
  Filled 2019-06-13: qty 1

## 2019-06-13 MED ORDER — SUGAMMADEX SODIUM 200 MG/2ML IV SOLN
INTRAVENOUS | Status: DC | PRN
Start: 2019-06-13 — End: 2019-06-13
  Administered 2019-06-13: 150 mg via INTRAVENOUS

## 2019-06-13 MED ORDER — EPHEDRINE SULFATE 50 MG/ML IJ SOLN
INTRAMUSCULAR | Status: DC | PRN
  Administered 2019-06-13: 5 mg via INTRAVENOUS

## 2019-06-13 MED ORDER — PHENYLEPHRINE 40 MCG/ML (10ML) SYRINGE FOR IV PUSH (FOR BLOOD PRESSURE SUPPORT)
PREFILLED_SYRINGE | INTRAVENOUS | Status: DC | PRN
  Administered 2019-06-13 (×2): 120 ug via INTRAVENOUS

## 2019-06-13 MED ORDER — LACTATED RINGERS IV SOLN
INTRAVENOUS | Status: DC | PRN
Start: 2019-06-13 — End: 2019-06-13

## 2019-06-13 MED ORDER — LACTATED RINGERS IV SOLN
INTRAVENOUS | Status: AC | PRN
  Administered 2019-06-13: 1000 mL

## 2019-06-13 SURGICAL SUPPLY — 44 items
BENZOIN TINCTURE PRP APPL 2/3 (GAUZE/BANDAGES/DRESSINGS) ×6 IMPLANT
BNDG ELASTIC 6X5.8 VLCR STR LF (GAUZE/BANDAGES/DRESSINGS) ×6 IMPLANT
CANISTER SUCT 3000ML PPV (MISCELLANEOUS) ×3 IMPLANT
CHLORAPREP W/TINT 26 (MISCELLANEOUS) ×6 IMPLANT
CLOSURE WOUND 1/2 X4 (GAUZE/BANDAGES/DRESSINGS) ×2
COVER SURGICAL LIGHT HANDLE (MISCELLANEOUS) ×6 IMPLANT
COVER WAND RF STERILE (DRAPES) ×3 IMPLANT
DRAPE HALF SHEET 40X57 (DRAPES) ×6 IMPLANT
DRAPE ORTHO SPLIT 77X108 STRL (DRAPES) ×6
DRAPE SURG ORHT 6 SPLT 77X108 (DRAPES) ×2 IMPLANT
DRAPE WARM FLUID 44X44 (DRAPES) ×3 IMPLANT
DRSG PAD ABDOMINAL 8X10 ST (GAUZE/BANDAGES/DRESSINGS) ×12 IMPLANT
ELECT CAUTERY BLADE 6.4 (BLADE) ×3 IMPLANT
ELECT REM PT RETURN 9FT ADLT (ELECTROSURGICAL) ×3
ELECTRODE REM PT RTRN 9FT ADLT (ELECTROSURGICAL) ×1 IMPLANT
GAUZE SPONGE 4X4 12PLY STRL (GAUZE/BANDAGES/DRESSINGS) ×6 IMPLANT
GLOVE BIOGEL M STRL SZ7.5 (GLOVE) ×6 IMPLANT
GLOVE BIOGEL PI IND STRL 8 (GLOVE) ×1 IMPLANT
GLOVE BIOGEL PI INDICATOR 8 (GLOVE) ×2
GLOVE SURG SS PI 7.0 STRL IVOR (GLOVE) ×6 IMPLANT
GOWN STRL REUS W/ TWL LRG LVL3 (GOWN DISPOSABLE) ×4 IMPLANT
GOWN STRL REUS W/TWL LRG LVL3 (GOWN DISPOSABLE) ×12
KIT BASIN OR (CUSTOM PROCEDURE TRAY) ×3 IMPLANT
KIT TURNOVER KIT B (KITS) ×3 IMPLANT
MARKER SKIN DUAL TIP RULER LAB (MISCELLANEOUS) ×3 IMPLANT
NEEDLE 18GX1X1/2 (RX/OR ONLY) (NEEDLE) ×3 IMPLANT
NEEDLE FILTER BLUNT 18X 1/2SAF (NEEDLE) ×2
NEEDLE FILTER BLUNT 18X1 1/2 (NEEDLE) ×1 IMPLANT
NEEDLE SPNL 18GX3.5 QUINCKE PK (NEEDLE) ×3 IMPLANT
NS IRRIG 1000ML POUR BTL (IV SOLUTION) ×6 IMPLANT
PACK GENERAL/GYN (CUSTOM PROCEDURE TRAY) ×3 IMPLANT
PAD ARMBOARD 7.5X6 YLW CONV (MISCELLANEOUS) ×6 IMPLANT
PENCIL SMOKE EVACUATOR (MISCELLANEOUS) ×3 IMPLANT
SPONGE LAP 18X18 RF (DISPOSABLE) ×3 IMPLANT
STAPLER INSORB 30 2030 C-SECTI (MISCELLANEOUS) ×6 IMPLANT
STAPLER VISISTAT 35W (STAPLE) ×6 IMPLANT
STRIP CLOSURE SKIN 1/2X4 (GAUZE/BANDAGES/DRESSINGS) ×4 IMPLANT
SUT MNCRL AB 4-0 PS2 18 (SUTURE) ×15 IMPLANT
SUT PDS AB 3-0 SH 27 (SUTURE) ×9 IMPLANT
SUT VLOC 90 P-14 23 (SUTURE) ×9 IMPLANT
SYR 30ML LL (SYRINGE) ×3 IMPLANT
SYR 50ML LL SCALE MARK (SYRINGE) ×6 IMPLANT
TOWEL GREEN STERILE (TOWEL DISPOSABLE) ×3 IMPLANT
TOWEL GREEN STERILE FF (TOWEL DISPOSABLE) ×3 IMPLANT

## 2019-06-13 NOTE — Op Note (Signed)
Operative Note   DATE OF OPERATION: 06/13/2019  LOCATION: [facility]   SURGICAL DEPARTMENT: Plastic Surgery  PREOPERATIVE DIAGNOSES: Bilateral symptomatic macromastia.  POSTOPERATIVE DIAGNOSES:  same  PROCEDURE: Bilateral breast reduction with superomedial pedicle.  SURGEON: Talmadge Coventry, MD  ASSISTANT: Phoebe Sharps, PA The advanced practice practitioner (APP) assisted throughout the case.  The APP was essential in retraction and counter traction when needed to make the case progress smoothly.  This retraction and assistance made it possible to see the tissue plans for the procedure.  The assistance was needed for blood control, tissue re-approximation and assisted with closure of the incision site.  ANESTHESIA: General.  COMPLICATIONS: None.   INDICATIONS FOR PROCEDURE:  The patient, April [name] is a 64 y.o. female born on 02/04/1955, is here for treatment of bilateral symptomatic macromastia. MRN: [id number]  CONSENT:  Informed consent was obtained directly from the patient. Risks, benefits and alternatives were fully discussed. Specific risks including but not limited to bleeding, infection, hematoma, seroma, scarring, pain, infection, contracture, asymmetry, wound healing problems, and need for further surgery were all discussed. The patient did have an ample opportunity to have questions answered to satisfaction.   DESCRIPTION OF PROCEDURE:  The patient was marked preoperatively for a Wise pattern skin excision.  The patient was taken to the operating room. SCDs were placed and antibiotics were given. General anesthesia was administered.The patient's operative site was prepped and draped in a sterile fashion. A time out was performed and all information was confirmed to be correct.  Right Breast: The breast was infiltrated with tumescent solution to help with hemostasis.  The nipple was marked with a cookie cutter.  A superomedial pedicle was drawn out  with the base of at least 8 cm in size.  A breast tourniquet was then applied and the pedicle was de-epithelialized.  Breast tourniquet was then let down and all incisions were made with a 10 blade.  The pedicle was then isolated down to the chest wall with cautery and the excision was performed removing tissue primarily inferiorly and laterally.  Hemostasis was obtained and the wound was stapled closed.  Left breast:  The breast was infiltrated with tumescent solution to help with hemostasis.  The nipple was marked with a cookie cutter.  A superomedial pedicle was drawn out with the base of at least 8 cm in size.  The pre-existing scar on this side was well lateral of the pedicle so a superomedial technique was used.  A breast tourniquet was then applied and the pedicle was de-epithelialized.  Breast tourniquet was then let down and all incisions were made with a 10 blade.  The pedicle was then isolated down to the chest wall with cautery and the excision was performed removing tissue primarily inferiorly and laterally.  Hemostasis was obtained and the wound was stapled closed.  Patient was then set up to check for size and symmetry.  Minor modifications were made.  This resulted in a total of 631g removed from the right side and 695g removed from the left side.  The inframammary incision was closed with a combination of buried in-sorb staples and a running 3-0 Quill suture.  The vertical and periareolar limbs were closed with interrupted buried 4-0 Monocryl and a running 4-0 Quill suture.  Steri-Strips were then applied along with a soft dressing and Ace wrap.  The patient tolerated the procedure well.  There were no complications. The patient was allowed to wake from anesthesia, extubated and taken  to the recovery room in satisfactory condition.  I was present for the entire procedure.

## 2019-06-13 NOTE — Anesthesia Preprocedure Evaluation (Addendum)
Anesthesia Evaluation  Patient identified by MRN, date of birth, ID band Patient awake    Reviewed: Allergy & Precautions, NPO status , Patient's Chart, lab work & pertinent test results, reviewed documented beta blocker date and time   History of Anesthesia Complications Negative for: history of anesthetic complications  Airway Mallampati: III  TM Distance: >3 FB Neck ROM: Full    Dental  (+) Teeth Intact, Dental Advisory Given Lower permanent partial:   Pulmonary asthma , COPD,  COPD inhaler, former smoker,    Pulmonary exam normal breath sounds clear to auscultation       Cardiovascular hypertension, Pt. on home beta blockers and Pt. on medications Normal cardiovascular exam Rhythm:Regular Rate:Normal  Echo 09/01/17: - Left ventricle: The cavity size was normal. Systolic function was mildly to moderately reduced. The estimated ejection fraction was in the range of 40% to 45%. Diffuse hypokinesis. Doppler parameters are consistent with abnormal left ventricular relaxation (grade 1 diastolic dysfunction).  - Left atrium: The atrium was normal in size.  - Right ventricle: Systolic function was normal.  - Pulmonary arteries: Systolic pressure was within the normal range.    Neuro/Psych  Headaches, PSYCHIATRIC DISORDERS Anxiety Depression    GI/Hepatic Neg liver ROS, GERD  ,  Endo/Other  Hypothyroidism   Renal/GU Renal InsufficiencyRenal disease     Musculoskeletal  (+) Arthritis , PMR   Abdominal   Peds  Hematology negative hematology ROS (+)   Anesthesia Other Findings Day of surgery medications reviewed with the patient.  Macromastia   Reproductive/Obstetrics                          Anesthesia Physical Anesthesia Plan  ASA: III  Anesthesia Plan: General   Post-op Pain Management:    Induction: Intravenous  PONV Risk Score and Plan: 3 and Midazolam, Dexamethasone, Ondansetron,  Scopolamine patch - Pre-op and Diphenhydramine  Airway Management Planned: Oral ETT  Additional Equipment:   Intra-op Plan:   Post-operative Plan: Extubation in OR  Informed Consent: I have reviewed the patients History and Physical, chart, labs and discussed the procedure including the risks, benefits and alternatives for the proposed anesthesia with the patient or authorized representative who has indicated his/her understanding and acceptance.     Dental advisory given  Plan Discussed with: CRNA  Anesthesia Plan Comments:        Anesthesia Quick Evaluation

## 2019-06-13 NOTE — Discharge Instructions (Signed)
Activity As tolerated: NO showers for 3 days No heavy activities  Diet: Regular  Wound Care: Keep dressing clean & dry for 3 days.  Keep wrap applied with compression as much as possible 24/7.    Do not change dressings for 3 days unless soiled.  Can change if needed but make sure to reapply wrap. After three days can remove wrap and shower.  Then reapply dressings if needed and continue compression with wrap or soft sports bra.  Call doctor if any unusual problems occur such as pain, excessive bleeding, unrelieved nausea/vomiting, fever &/or chills  Follow-up appointment: Scheduled for next week on 6/16.  Pain Management Plan: 1) Ibuprofen 800 mg every 8 hours         If still having pain... Add 2) Tylenol 500 mg every 8 hours          If still in pain... Add 3) Rx pain medication - Hydorcodone         Recommend @ night         May use up to every 8 hours if needed for severe pain   You may alternate between Ibuprofen and Tylenol taking one every 4 hours if desired. May use ice, avoid incisions and nipples.

## 2019-06-13 NOTE — Anesthesia Procedure Notes (Signed)

## 2019-06-13 NOTE — Anesthesia Postprocedure Evaluation (Signed)
Anesthesia Post Note  Patient: Ronita Hargreaves  Procedure(s) Performed: MAMMARY REDUCTION  (BREAST) (Bilateral Breast)     Patient location during evaluation: PACU Anesthesia Type: General Level of consciousness: awake and alert Pain management: pain level controlled Vital Signs Assessment: post-procedure vital signs reviewed and stable Respiratory status: spontaneous breathing, nonlabored ventilation, respiratory function stable and patient connected to nasal cannula oxygen Cardiovascular status: blood pressure returned to baseline and stable Postop Assessment: no apparent nausea or vomiting Anesthetic complications: no    Last Vitals:  Vitals:   06/13/19 1340 06/13/19 1345  BP: 123/69   Pulse: 87 89  Resp: 16 15  Temp: (!) 36.2 C   SpO2: 94% 93%    Last Pain:  Vitals:   06/13/19 1337  TempSrc:   PainSc: 4                  Catalina Gravel

## 2019-06-13 NOTE — Transfer of Care (Signed)
Immediate Anesthesia Transfer of Care Note  Patient: April [name]  Procedure(s) Performed: MAMMARY REDUCTION  (BREAST) (Bilateral Breast)  Patient Location: PACU  Anesthesia Type:General  Level of Consciousness: awake, alert , oriented, patient cooperative and responds to stimulation  Airway & Oxygen Therapy: Patient Spontanous Breathing and Patient connected to face mask oxygen  Post-op Assessment: Report given to RN and Post -op Vital signs reviewed and stable  Post vital signs: Reviewed and stable  Last Vitals:  Vitals Value Taken Time  BP 134/73 06/13/19 1253  Temp    Pulse 94 06/13/19 1254  Resp 15 06/13/19 1254  SpO2 100 % 06/13/19 1254  Vitals shown include unvalidated device data.  Last Pain:  Vitals:   06/13/19 0829  TempSrc: Oral  PainSc: 0-No pain         Complications: No apparent anesthesia complications

## 2019-06-13 NOTE — Interval H&P Note (Signed)
History and Physical Interval Note:  06/13/2019 9:36 AM  April [name]  has presented today for surgery, with the diagnosis of macromastia.  The various methods of treatment have been discussed with the patient and family. After consideration of risks, benefits and other options for treatment, the patient has consented to  Procedure(s): MAMMARY REDUCTION  (BREAST) (Bilateral) as a surgical intervention.  The patient's history has been reviewed, patient examined, no change in status, stable for surgery.  I have reviewed the patient's chart and labs.  Questions were answered to the patient's satisfaction.     Cindra Presume

## 2019-06-13 NOTE — Brief Op Note (Signed)
06/13/2019  12:39 PM  PATIENT:  April [name]  64 y.o. female  PRE-OPERATIVE DIAGNOSIS:  macromastia  POST-OPERATIVE DIAGNOSIS:  macromastia  PROCEDURE:  Procedure(s): MAMMARY REDUCTION  (BREAST) (Bilateral)  SURGEON:  Surgeon(s) and Role:    * Jamel Holzmann, Steffanie Dunn, MD - Primary  PHYSICIAN ASSISTANT: Phoebe Sharps, PA  ASSISTANTS: none   ANESTHESIA:   general  EBL:  25   BLOOD ADMINISTERED:none  DRAINS: none   LOCAL MEDICATIONS USED:  MARCAINE     SPECIMEN:  Source of Specimen:  right and left breast tissue  DISPOSITION OF SPECIMEN:  N/A  COUNTS:  YES  TOURNIQUET:  * No tourniquets in log *  DICTATION: .Dragon Dictation  PLAN OF CARE: Discharge to home after PACU  PATIENT DISPOSITION:  PACU - hemodynamically stable.   Delay start of Pharmacological VTE agent (>24hrs) due to surgical blood loss or risk of bleeding: not applicable

## 2019-06-14 ENCOUNTER — Ambulatory Visit: Payer: Medicare Other | Admitting: Rheumatology

## 2019-06-14 LAB — SURGICAL PATHOLOGY

## 2019-06-14 MED FILL — Bupivacaine HCl Preservative Free (PF) Inj 0.25%: INTRAMUSCULAR | Qty: 75 | Status: AC

## 2019-06-21 ENCOUNTER — Encounter: Payer: Self-pay | Admitting: Plastic Surgery

## 2019-06-21 ENCOUNTER — Other Ambulatory Visit: Payer: Self-pay

## 2019-06-21 ENCOUNTER — Ambulatory Visit (INDEPENDENT_AMBULATORY_CARE_PROVIDER_SITE_OTHER): Payer: Medicare Other | Admitting: Plastic Surgery

## 2019-06-21 VITALS — BP 120/81 | HR 92 | Temp 97.3°F

## 2019-06-21 DIAGNOSIS — N62 Hypertrophy of breast: Secondary | ICD-10-CM

## 2019-06-21 NOTE — Progress Notes (Signed)
Patient presents 1 week postop from bilateral breast reduction.  She feels like she is doing well.  She did have some drainage on the left side but that has leveled off.  On examination everything looks to be healing well.  All the incisions are intact.  She does have a small blister superiorly on the right nipple areolar complex as about 1.5 cm in size.  Otherwise the nipple areolar complexes appear viable with good perfusion.  She has minimal bruising and swelling.  We will plan to have her continue wearing compression garment and limiting her strenuous activity.  We will see her back in around 3 weeks.  All of her questions were answered.

## 2019-06-26 ENCOUNTER — Encounter (HOSPITAL_COMMUNITY): Payer: Self-pay | Admitting: Plastic Surgery
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
## 2019-06-27 ENCOUNTER — Telehealth (HOSPITAL_COMMUNITY): Payer: Self-pay

## 2019-06-27 ENCOUNTER — Telehealth (HOSPITAL_COMMUNITY): Payer: Self-pay | Admitting: Family Medicine

## 2019-07-06 ENCOUNTER — Ambulatory Visit: Payer: Medicare Other | Admitting: Rheumatology

## 2019-07-06 ENCOUNTER — Encounter: Payer: Self-pay | Admitting: Rheumatology

## 2019-07-06 ENCOUNTER — Other Ambulatory Visit: Payer: Self-pay

## 2019-07-06 VITALS — BP 132/83 | HR 79 | Resp 14 | Ht 66.5 in | Wt 186.8 lb

## 2019-07-06 DIAGNOSIS — Z8659 Personal history of other mental and behavioral disorders: Secondary | ICD-10-CM

## 2019-07-06 DIAGNOSIS — M316 Other giant cell arteritis: Secondary | ICD-10-CM

## 2019-07-06 DIAGNOSIS — M51369 Other intervertebral disc degeneration, lumbar region without mention of lumbar back pain or lower extremity pain: Secondary | ICD-10-CM

## 2019-07-06 DIAGNOSIS — Z8639 Personal history of other endocrine, nutritional and metabolic disease: Secondary | ICD-10-CM | POA: Diagnosis not present

## 2019-07-06 DIAGNOSIS — M8589 Other specified disorders of bone density and structure, multiple sites: Secondary | ICD-10-CM

## 2019-07-06 DIAGNOSIS — M5136 Other intervertebral disc degeneration, lumbar region: Secondary | ICD-10-CM | POA: Diagnosis not present

## 2019-07-06 DIAGNOSIS — F17200 Nicotine dependence, unspecified, uncomplicated: Secondary | ICD-10-CM

## 2019-07-06 DIAGNOSIS — Z96643 Presence of artificial hip joint, bilateral: Secondary | ICD-10-CM

## 2019-07-06 DIAGNOSIS — M353 Polymyalgia rheumatica: Secondary | ICD-10-CM | POA: Diagnosis not present

## 2019-07-06 DIAGNOSIS — Z8709 Personal history of other diseases of the respiratory system: Secondary | ICD-10-CM

## 2019-07-06 DIAGNOSIS — R2689 Other abnormalities of gait and mobility: Secondary | ICD-10-CM

## 2019-07-12 ENCOUNTER — Ambulatory Visit (INDEPENDENT_AMBULATORY_CARE_PROVIDER_SITE_OTHER): Payer: Medicare Other | Admitting: Surgical

## 2019-07-12 ENCOUNTER — Other Ambulatory Visit: Payer: Self-pay

## 2019-07-12 ENCOUNTER — Encounter: Payer: Self-pay | Admitting: Surgical

## 2019-07-12 VITALS — BP 122/74 | HR 81 | Temp 97.8°F

## 2019-07-12 DIAGNOSIS — M4004 Postural kyphosis, thoracic region: Secondary | ICD-10-CM

## 2019-07-12 DIAGNOSIS — M545 Low back pain, unspecified: Secondary | ICD-10-CM

## 2019-07-12 DIAGNOSIS — N62 Hypertrophy of breast: Secondary | ICD-10-CM

## 2019-07-12 DIAGNOSIS — M546 Pain in thoracic spine: Secondary | ICD-10-CM

## 2019-07-12 NOTE — Progress Notes (Signed)
Patient is a 64 year old female here for follow-up after bilateral breast reduction 1 month ago with Dr. Claudia Desanctis. She is doing well today.  She reports she has been applying Arnica cream for the bruising she has noticed after the procedure.  She also reports that she has had some irritation in the left lateral axillary incision.  She is unsure what is causing it.  She has no other concerns.  Chaperone present on exam On exam bilateral NAC's with good color, good cap refill, incisions are C/D/I.  No erythema or drainage noted.  Minimal tenderness to palpation.  Along the left lateral breast the V lock suture has pulled through and was poking through the skin, she noted immediate relief after removal.  Some scabbing noted along the right NAC incision.  Patient's blister has resolved, there is some epithelium discoloration due to the blister.   Recommend continue to wear sports bra for 2 to 3 months postop. No sign of infection, seroma, hematoma. May begin using scar cream. Call with questions or concerns, follow-up in 4 to 6 weeks.  Pictures were obtained of the patient and placed in the chart with the patient's or guardian's permission.

## 2019-07-13 ENCOUNTER — Telehealth (HOSPITAL_COMMUNITY): Payer: Self-pay | Admitting: *Deleted

## 2019-07-14 ENCOUNTER — Telehealth: Payer: Self-pay

## 2019-07-14 NOTE — Telephone Encounter (Signed)
Called and spoke with the patient regarding starting pulmonary rehab.  Informed her of the message from [facility] below.  Patient verbalized understanding and agreed.//AB/CMA

## 2019-07-14 NOTE — Telephone Encounter (Signed)
Patient called wanting to know if it is ok for her to start pulmonary rehab. Her first visit is Monday, 07/17/2019. She does not know exactly what this visit will entail or if she will have to do any strenuous activity.

## 2019-07-14 NOTE — Telephone Encounter (Signed)
Tried to call patient at 12:50 pm, no answer. Did not leave voicemail message.   If patient returns call, inform patient that pulmonary rehab appointment will be fine. She should discuss with the pulmonary team what will be required of her in regards to activities. Her restrictions at this time are no lifting > 15 lb for 2 more weeks (total of 6 week post op) and then she can begin doing more activity as tolerated/limited by pain. She is able to walk/lightly jog at this time, she can bike on a stationary bike and use some other machines that do not require upper arm lifting/pushing/pulling greater than 15 lb.  Please call back with any further questions or concerns or if she needs any clarification.

## 2019-07-17 ENCOUNTER — Encounter (HOSPITAL_COMMUNITY): Payer: Self-pay

## 2019-07-17 ENCOUNTER — Encounter (HOSPITAL_COMMUNITY)
Admission: RE | Admit: 2019-07-17 | Discharge: 2019-07-17 | Disposition: A | Discharge status: Home / Self Care | Payer: Medicare Other | Source: Ambulatory Visit | Attending: Pulmonary Disease | Admitting: Pulmonary Disease

## 2019-07-17 ENCOUNTER — Other Ambulatory Visit: Payer: Self-pay

## 2019-07-17 VITALS — BP 122/70 | HR 89 | Ht 65.5 in | Wt 185.2 lb

## 2019-07-17 DIAGNOSIS — J45909 Unspecified asthma, uncomplicated: Secondary | ICD-10-CM | POA: Insufficient documentation

## 2019-07-17 NOTE — Progress Notes (Signed)
Pulmonary Individual Treatment Plan  Patient Details  Name: April [name] MRN: [id number] Date of Birth: Jun 20, 1955 Referring Provider:     Pulmonary Rehab Walk Test from 07/17/2019 in [facility]  Referring Provider [name]      Initial Encounter Date:    Pulmonary Rehab Walk Test from 07/17/2019 in [facility]  Date 07/17/19      Visit Diagnosis: Uncomplicated asthma, unspecified asthma severity, unspecified whether persistent  Patient's Home Medications on Admission:   Current Outpatient Medications:  .  acetaminophen (TYLENOL) 500 MG tablet, Take 1,000 mg by mouth every 6 (six) hours as needed for moderate pain or headache., Disp: , Rfl:  .  albuterol (PROVENTIL) (2.5 MG/3ML) 0.083% nebulizer solution, Take 3 mLs (2.5 mg total) by nebulization every 6 (six) hours as needed for wheezing or shortness of breath., Disp: 75 mL, Rfl: 3 .  albuterol (VENTOLIN HFA) 108 (90 Base) MCG/ACT inhaler, Inhale 2 puffs into the lungs every 6 (six) hours as needed for wheezing or shortness of breath. Okay to dispense proair/Ventolin/albuterol., Disp: 18 g, Rfl: 3 .  ALPRAZolam (XANAX) 0.5 MG tablet, TAKE 1/2 TO 1 TABLET BY  MOUTH TWICE DAILY AS NEEDED FOR ANXIETY (Patient taking differently: Take 0.25-0.5 mg by mouth 2 (two) times daily as needed for anxiety. ), Disp: 60 tablet, Rfl: 2 .  Budeson-Glycopyrrol-Formoterol (BREZTRI AEROSPHERE) 160-9-4.8 MCG/ACT AERO, Inhale 2 puffs into the lungs 2 (two) times daily., Disp: 17.3 g, Rfl: 5 .  buPROPion (WELLBUTRIN XL) 300 MG 24 hr tablet, TAKE 1 TABLET BY MOUTH  DAILY (Patient taking differently: Take 300 mg by mouth daily. ), Disp: 90 tablet, Rfl: 2 .  Calcium 500 MG CHEW, Chew 500 mg by mouth 2 (two) times daily. , Disp: , Rfl:  .  Cholecalciferol (DIALYVITE VITAMIN D 5000) 125 MCG (5000 UT) capsule, Take 5,000 Units by mouth 2 (two) times daily., Disp: , Rfl:  .  diclofenac  Sodium (VOLTAREN) 1 % GEL, Apply 1 application topically daily as needed (pain)., Disp: , Rfl:  .  fluticasone (FLONASE) 50 MCG/ACT nasal spray, SPRAY 2 SPRAYS INTO EACH NOSTRIL EVERY DAY (Patient taking differently: Place 2 sprays into both nostrils daily as needed for allergies. ), Disp: 48 mL, Rfl: 3 .  levothyroxine (SYNTHROID) 125 MCG tablet, TAKE 1 TABLET BY MOUTH  DAILY EXCEPT TAKE 1 AND 1/2 TABLETS ON SUNDAY (Patient taking differently: Take 125-250 mcg by mouth See admin instructions. Take 125 mcg daily except take 250 mcg on Sunday), Disp: 98 tablet, Rfl: 2 .  meclizine (ANTIVERT) 25 MG tablet, Take 0.5-1 tablets (12.5-25 mg total) by mouth 3 (three) times daily as needed for dizziness., Disp: 30 tablet, Rfl: 1 .  metoprolol succinate (TOPROL-XL) 25 MG 24 hr tablet, Take 1 tablet (25 mg total) by mouth daily., Disp: 90 tablet, Rfl: 2 .  Multiple Vitamin (MULTIVITAMIN) tablet, Take 1 tablet by mouth daily., Disp: , Rfl:  .  Polyethyl Glycol-Propyl Glycol (SYSTANE OP), Place 1 drop into both eyes daily as needed (dryness). , Disp: , Rfl:  .  Spacer/Aero-Holding Chambers (AEROCHAMBER MV) inhaler, Use as instructed, Disp: 1 each, Rfl: 0 .  tiZANidine (ZANAFLEX) 4 MG tablet, TAKE 1/2 TO 1 TABLET BY  MOUTH EVERY 6 HOURS AS  NEEDED FOR MUSCLE SPASMS (Patient taking differently: Take 2-4 mg by mouth every 6 (six) hours as needed for muscle spasms. ), Disp: 120 tablet, Rfl: 1 .  valsartan (DIOVAN) 40 MG  tablet, Take 1 tablet (40 mg total) by mouth daily., Disp: 30 tablet, Rfl: 11 .  vitamin C (ASCORBIC ACID) 250 MG tablet, Take 500 mg by mouth daily., Disp: , Rfl:   Past Medical History: Past Medical History:  Diagnosis Date  . Anxiety   . Anxiety and depression    related to caring for mother during terminal illness  . Arthritis   . Asthma   . AVN (avascular necrosis of bone) (HCC)    hip and wrist  . Bronchitis    hx of  . COPD (chronic obstructive pulmonary disease) ([place])   . Depression    . Endometriosis   . FH: CAD (coronary artery disease)   . GERD (gastroesophageal reflux disease)    ocassional  . History of blood transfusion    after hip repackment - broke out in hives and started itching  . History of palpitations   . Hypertension   . Hypothyroid   . Osteopenia    forteo through Dr. Estanislado Pandy (started 10/12)  . PMR (polymyalgia rheumatica) (HCC)   . Pneumonia 2019  . SIRS (systemic inflammatory response syndrome) ([place]) 10/2017  . Smoker   . SOB (shortness of breath) on exertion   . Squamous cell carcinoma    facial, 2016  . Temporal arteritis (HCC)    s/p prednisone taper    Tobacco Use: Social History   Tobacco Use  Smoking Status Former Smoker  . Packs/day: 0.33  . Years: 34.00  . Pack years: 11.22  . Types: Cigarettes  . Quit date: 10/24/2017  . Years since quitting: 1.7  Smokeless Tobacco Never Used  Tobacco Comment   quit smoking october 2019    Labs: Recent Review Scientist, physiological    Labs for ITP Cardiac and Pulmonary Rehab Latest Ref Rng & Units 03/30/2011 08/12/2015 08/12/2016 08/19/2017 08/23/2018   Cholestrol 0 - 200 mg/dL 186 202(H) 198 202(H) 198   LDLCALC 0 - 99 mg/dL 115(H) 139(H) 142(H) 133(H) 122(H)   HDL >39.00 mg/dL 62.20 49.50 43.70 56.70 62.60   Trlycerides 0 - 149 mg/dL 46.0 69.0 62.0 62.0 68.0      Capillary Blood Glucose: No results found for: GLUCAP   Pulmonary Assessment Scores:  Pulmonary Assessment Scores    Row Name 07/17/19 1146         ADL UCSD   ADL Phase Entry     SOB Score total 46       CAT Score   CAT Score 15       mMRC Score   mMRC Score 2           UCSD: Self-administered rating of dyspnea associated with activities of daily living (ADLs) 6-point scale (0 = "not at all" to 5 = "maximal or unable to do because of breathlessness")  Scoring Scores range from 0 to 120.  Minimally important difference is 5 units  CAT: CAT can identify the health impairment of COPD patients and is better  correlated with disease progression.  CAT has a scoring range of zero to 40. The CAT score is classified into four groups of low (less than 10), medium (10 - 20), high (21-30) and very high (31-40) based on the impact level of disease on health status. A CAT score over 10 suggests significant symptoms.  A worsening CAT score could be explained by an exacerbation, poor medication adherence, poor inhaler technique, or progression of COPD or comorbid conditions.  CAT MCID is 2 points  mMRC: mMRC (Modified Medical  Research Council) Dyspnea Scale is used to assess the degree of baseline functional disability in patients of respiratory disease due to dyspnea. No minimal important difference is established. A decrease in score of 1 point or greater is considered a positive change.   Pulmonary Function Assessment:  Pulmonary Function Assessment - 07/17/19 1157      Breath   Bilateral Breath Sounds Clear    Shortness of Breath Yes;Fear of Shortness of Breath;Limiting activity;Panic with Shortness of Breath           Exercise Target Goals: Exercise Program Goal: Individual exercise prescription set using results from initial 6 min walk test and THRR while considering  patient's activity barriers and safety.   Exercise Prescription Goal: Initial exercise prescription builds to 30-45 minutes a day of aerobic activity, 2-3 days per week.  Home exercise guidelines will be given to patient during program as part of exercise prescription that the participant will acknowledge.  Activity Barriers & Risk Stratification:  Activity Barriers & Cardiac Risk Stratification - 07/17/19 1125      Activity Barriers & Cardiac Risk Stratification   Activity Barriers Left Hip Replacement;Right Hip Replacement;Arthritis;Deconditioning;Muscular Weakness;Shortness of Breath;History of Falls           6 Minute Walk:  6 Minute Walk    Row Name 07/17/19 1145         6 Minute Walk   Phase Initial     Distance  1064 feet     Walk Time 6 minutes     # of Rest Breaks 1     MPH 2.05     METS 3.07     RPE 11     Perceived Dyspnea  2     VO2 Peak 10.75     Symptoms Yes (comment)     Comments SOB, took 30 second break. RPD = 2     Resting HR 88 bpm     Resting BP 122/70     Resting Oxygen Saturation  98 %     Exercise Oxygen Saturation  during 6 min walk 91 %     Max Ex. HR 122 bpm     Max Ex. BP 168/94     2 Minute Post BP 140/84       Interval HR   1 Minute HR 109     2 Minute HR 115     3 Minute HR 120     4 Minute HR 122     5 Minute HR 120     6 Minute HR 117     2 Minute Post HR 100     Interval Heart Rate? Yes       Interval Oxygen   Interval Oxygen? Yes     Baseline Oxygen Saturation % 98 %     1 Minute Oxygen Saturation % 95 %     1 Minute Liters of Oxygen 0 L     2 Minute Oxygen Saturation % 93 %     2 Minute Liters of Oxygen 0 L     3 Minute Oxygen Saturation % 92 %     3 Minute Liters of Oxygen 0 L     4 Minute Oxygen Saturation % 93 %     4 Minute Liters of Oxygen 0 L     5 Minute Oxygen Saturation % 91 %     5 Minute Liters of Oxygen 0 L     6 Minute Oxygen Saturation % 91 %  6 Minute Liters of Oxygen 0 L     2 Minute Post Oxygen Saturation % 98 %     2 Minute Post Liters of Oxygen 0 L            Oxygen Initial Assessment:  Oxygen Initial Assessment - 07/17/19 1128      Home Oxygen   Home Oxygen Device None    Sleep Oxygen Prescription None    Home Exercise Oxygen Prescription None    Home at Rest Exercise Oxygen Prescription None      Initial 6 min Walk   Oxygen Used None      Program Oxygen Prescription   Program Oxygen Prescription None      Intervention   Short Term Goals To learn and understand importance of monitoring SPO2 with pulse oximeter and demonstrate accurate use of the pulse oximeter.;To learn and understand importance of maintaining oxygen saturations>88%;To learn and demonstrate proper pursed lip breathing techniques or other  breathing techniques.;To learn and demonstrate proper use of respiratory medications    Long  Term Goals Verbalizes importance of monitoring SPO2 with pulse oximeter and return demonstration;Maintenance of O2 saturations>88%;Exhibits proper breathing techniques, such as pursed lip breathing or other method taught during program session;Compliance with respiratory medication;Demonstrates proper use of MDI's           Oxygen Re-Evaluation:   Oxygen Discharge (Final Oxygen Re-Evaluation):   Initial Exercise Prescription:  Initial Exercise Prescription - 07/17/19 1200      Date of Initial Exercise RX and Referring Provider   Date 07/17/19    Referring Provider [name]    Expected Discharge Date 09/21/19      NuStep   Level 2    Minutes 30    METs 1.8      Prescription Details   Frequency (times per week) 2    Duration Progress to 30 minutes of continuous aerobic without signs/symptoms of physical distress      Intensity   THRR 40-80% of Max Heartrate 62-123    Ratings of Perceived Exertion 11-13    Perceived Dyspnea 0-4      Progression   Progression Continue progressive overload as per policy without signs/symptoms or physical distress.      Resistance Training   Training Prescription Yes    Weight Theraband    Reps 10-15           Perform Capillary Blood Glucose checks as needed.  Exercise Prescription Changes:   Exercise Comments:   Exercise Goals and Review:  Exercise Goals    Row Name 07/17/19 1243             Exercise Goals   Increase Physical Activity Yes       Intervention Provide advice, education, support and counseling about physical activity/exercise needs.;Develop an individualized exercise prescription for aerobic and resistive training based on initial evaluation findings, risk stratification, comorbidities and participant's personal goals.       Expected Outcomes Short Term: Attend rehab on a regular basis to increase amount of  physical activity.;Long Term: Add in home exercise to make exercise part of routine and to increase amount of physical activity.;Long Term: Exercising regularly at least 3-5 days a week.       Increase Strength and Stamina Yes       Intervention Provide advice, education, support and counseling about physical activity/exercise needs.;Develop an individualized exercise prescription for aerobic and resistive training based on initial evaluation findings, risk stratification, comorbidities and participant's personal goals.  Expected Outcomes Short Term: Increase workloads from initial exercise prescription for resistance, speed, and METs.;Short Term: Perform resistance training exercises routinely during rehab and add in resistance training at home;Long Term: Improve cardiorespiratory fitness, muscular endurance and strength as measured by increased METs and functional capacity (6MWT)       Able to understand and use rate of perceived exertion (RPE) scale Yes       Intervention Provide education and explanation on how to use RPE scale       Expected Outcomes Short Term: Able to use RPE daily in rehab to express subjective intensity level;Long Term:  Able to use RPE to guide intensity level when exercising independently       Able to understand and use Dyspnea scale Yes       Intervention Provide education and explanation on how to use Dyspnea scale       Expected Outcomes Short Term: Able to use Dyspnea scale daily in rehab to express subjective sense of shortness of breath during exertion;Long Term: Able to use Dyspnea scale to guide intensity level when exercising independently       Knowledge and understanding of Target Heart Rate Range (THRR) Yes       Intervention Provide education and explanation of THRR including how the numbers were predicted and where they are located for reference       Expected Outcomes Short Term: Able to state/look up THRR;Long Term: Able to use THRR to govern intensity  when exercising independently;Short Term: Able to use daily as guideline for intensity in rehab       Understanding of Exercise Prescription Yes       Intervention Provide education, explanation, and written materials on patient's individual exercise prescription       Expected Outcomes Short Term: Able to explain program exercise prescription;Long Term: Able to explain home exercise prescription to exercise independently              Exercise Goals Re-Evaluation :   Discharge Exercise Prescription (Final Exercise Prescription Changes):   Nutrition:  Target Goals: Understanding of nutrition guidelines, daily intake of sodium 1500mg , cholesterol 200mg , calories 30% from fat and 7% or less from saturated fats, daily to have 5 or more servings of fruits and vegetables.  Biometrics:  Pre Biometrics - 07/17/19 1126      Pre Biometrics   Height 5' 5.5" (1.664 m)    Weight 84 kg    BMI (Calculated) 30.34    Grip Strength 28 kg            Nutrition Therapy Plan and Nutrition Goals:   Nutrition Assessments:   Nutrition Goals Re-Evaluation:   Nutrition Goals Discharge (Final Nutrition Goals Re-Evaluation):   Psychosocial: Target Goals: Acknowledge presence or absence of significant depression and/or stress, maximize coping skills, provide positive support system. Participant is able to verbalize types and ability to use techniques and skills needed for reducing stress and depression.  Initial Review & Psychosocial Screening:  Initial Psych Review & Screening - 07/17/19 1129      Initial Review   Current issues with History of Depression;Current Depression;Current Anxiety/Panic   Depression is stable on current medications     Family Dynamics   Good Support System? No    Strains Intra-family strains   divorced, sons side with her x-husband   Concerns No support system    Comments Wants to start going back to church      Barriers   Psychosocial barriers to  participate in program There are no identifiable barriers or psychosocial needs.      Screening Interventions   Interventions Encouraged to exercise           Quality of Life Scores:  Scores of 19 and below usually indicate a poorer quality of life in these areas.  A difference of  2-3 points is a clinically meaningful difference.  A difference of 2-3 points in the total score of the Quality of Life Index has been associated with significant improvement in overall quality of life, self-image, physical symptoms, and general health in studies assessing change in quality of life.  PHQ-9: Recent Review Flowsheet Data    Depression screen [facility] 2/9 07/17/2019 08/26/2018 08/24/2017 08/17/2016   Decreased Interest 0 0 0 0   Down, Depressed, Hopeless 0 0 0 0   PHQ - 2 Score 0 0 0 0   Altered sleeping 0 - - -   Tired, decreased energy 0 - - -   Change in appetite 0 - - -   Feeling bad or failure about yourself  0 - - -   Trouble concentrating 0 - - -   Moving slowly or fidgety/restless 0 - - -   Suicidal thoughts 0 - - -   PHQ-9 Score 0 - - -   Difficult doing work/chores Not difficult at all - - -     Interpretation of Total Score  Total Score Depression Severity:  1-4 = Minimal depression, 5-9 = Mild depression, 10-14 = Moderate depression, 15-19 = Moderately severe depression, 20-27 = Severe depression   Psychosocial Evaluation and Intervention:  Psychosocial Evaluation - 07/17/19 1133      Psychosocial Evaluation & Interventions   Interventions Stress management education;Relaxation education    Comments Depression is currently stable on current medications    Continue Psychosocial Services  Follow up required by staff           Psychosocial Re-Evaluation:   Psychosocial Discharge (Final Psychosocial Re-Evaluation):   Education: Education Goals: Education classes will be provided on a weekly basis, covering required topics. Participant will state understanding/return  demonstration of topics presented.  Learning Barriers/Preferences:   Education Topics: Risk Factor Reduction:  -Group instruction that is supported by a PowerPoint presentation. Instructor discusses the definition of a risk factor, different risk factors for pulmonary disease, and how the heart and lungs work together.     Nutrition for Pulmonary Patient:  -Group instruction provided by PowerPoint slides, verbal discussion, and written materials to support subject matter. The instructor gives an explanation and review of healthy diet recommendations, which includes a discussion on weight management, recommendations for fruit and vegetable consumption, as well as protein, fluid, caffeine, fiber, sodium, sugar, and alcohol. Tips for eating when patients are short of breath are discussed.   Pursed Lip Breathing:  -Group instruction that is supported by demonstration and informational handouts. Instructor discusses the benefits of pursed lip and diaphragmatic breathing and detailed demonstration on how to preform both.     Oxygen Safety:  -Group instruction provided by PowerPoint, verbal discussion, and written material to support subject matter. There is an overview of "What is Oxygen" and "Why do we need it".  Instructor also reviews how to create a safe environment for oxygen use, the importance of using oxygen as prescribed, and the risks of noncompliance. There is a brief discussion on traveling with oxygen and resources the patient may utilize.   Oxygen Equipment:  -Group instruction provided by [facility] Health Staff  utilizing handouts, written materials, and equipment demonstrations.   Signs and Symptoms:  -Group instruction provided by written material and verbal discussion to support subject matter. Warning signs and symptoms of infection, stroke, and heart attack are reviewed and when to call the physician/911 reinforced. Tips for preventing the spread of infection  discussed.   Advanced Directives:  -Group instruction provided by verbal instruction and written material to support subject matter. Instructor reviews Advanced Directive laws and proper instruction for filling out document.   Pulmonary Video:  -Group video education that reviews the importance of medication and oxygen compliance, exercise, good nutrition, pulmonary hygiene, and pursed lip and diaphragmatic breathing for the pulmonary patient.   Exercise for the Pulmonary Patient:  -Group instruction that is supported by a PowerPoint presentation. Instructor discusses benefits of exercise, core components of exercise, frequency, duration, and intensity of an exercise routine, importance of utilizing pulse oximetry during exercise, safety while exercising, and options of places to exercise outside of rehab.     Pulmonary Medications:  -Verbally interactive group education provided by instructor with focus on inhaled medications and proper administration.   Anatomy and Physiology of the Respiratory System and Intimacy:  -Group instruction provided by PowerPoint, verbal discussion, and written material to support subject matter. Instructor reviews respiratory cycle and anatomical components of the respiratory system and their functions. Instructor also reviews differences in obstructive and restrictive respiratory diseases with examples of each. Intimacy, Sex, and Sexuality differences are reviewed with a discussion on how relationships can change when diagnosed with pulmonary disease. Common sexual concerns are reviewed.   MD DAY -A group question and answer session with a medical doctor that allows participants to ask questions that relate to their pulmonary disease state.   OTHER EDUCATION -Group or individual verbal, written, or video instructions that support the educational goals of the pulmonary rehab program.   Holiday Eating Survival Tips:  -Group instruction provided by  PowerPoint slides, verbal discussion, and written materials to support subject matter. The instructor gives patients tips, tricks, and techniques to help them not only survive but enjoy the holidays despite the onslaught of food that accompanies the holidays.   Knowledge Questionnaire Score:  Knowledge Questionnaire Score - 07/17/19 1154      Knowledge Questionnaire Score   Pre Score 7/18           Core Components/Risk Factors/Patient Goals at Admission:  Personal Goals and Risk Factors at Admission - 07/17/19 1156      Core Components/Risk Factors/Patient Goals on Admission   Improve shortness of breath with ADL's Yes    Intervention Provide education, individualized exercise plan and daily activity instruction to help decrease symptoms of SOB with activities of daily living.    Expected Outcomes Short Term: Improve cardiorespiratory fitness to achieve a reduction of symptoms when performing ADLs;Long Term: Be able to perform more ADLs without symptoms or delay the onset of symptoms           Core Components/Risk Factors/Patient Goals Review:   Goals and Risk Factor Review    Row Name 07/17/19 1134 07/17/19 1157           Core Components/Risk Factors/Patient Goals Review   Personal Goals Review Improve shortness of breath with ADL's;Develop more efficient breathing techniques such as purse lipped breathing and diaphragmatic breathing and practicing self-pacing with activity.;Increase knowledge of respiratory medications and ability to use respiratory devices properly. Increase knowledge of respiratory medications and ability to use respiratory devices properly.;Improve shortness of breath with  ADL's;Develop more efficient breathing techniques such as purse lipped breathing and diaphragmatic breathing and practicing self-pacing with activity.             Core Components/Risk Factors/Patient Goals at Discharge (Final Review):   Goals and Risk Factor Review - 07/17/19 1157       Core Components/Risk Factors/Patient Goals Review   Personal Goals Review Increase knowledge of respiratory medications and ability to use respiratory devices properly.;Improve shortness of breath with ADL's;Develop more efficient breathing techniques such as purse lipped breathing and diaphragmatic breathing and practicing self-pacing with activity.           ITP Comments:   Comments:

## 2019-07-17 NOTE — Progress Notes (Signed)
April [name] 64 y.o. female Pulmonary Rehab Orientation Note Patient arrived today in Cardiac and Pulmonary Rehab for orientation to Pulmonary Rehab. She walked from the [facility] parking deck with moderate shortness of breath. She does not carry portable oxygen. Per pt, she uses oxygen never. Color good, skin warm and dry. Patient is oriented to time and place. Patient's medical history, psychosocial health, and medications reviewed. Psychosocial assessment reveals pt lives alone. Pt is currently retired.  Pt reports her stress level is moderate. Areas of stress/anxiety include Family. She is divorced and is estranged from one of her sons and his family due to the divorce. She has a history of depression, and is currently on wellbutrin and takes xanax for occasional anxiety.  She feels her current medications keep her depression stable.  Pt does not exhibit signs of depression.  PHQ2/9 score 0/0. Pt shows good  coping skills with positive outlook . Will continue to monitor and evaluate progress toward psychosocial goal(s) of continued mental wellbeing while participating in pulmonary rehab. Physical assessment reveals heart rate is normal, breath sounds clear to auscultation, no wheezes, rales, or rhonchi. Grip strength equal, strong. Patient reports she does take medications as prescribed. Patient states she follows a Regular diet. The patient reports no specific efforts to gain or lose weight..Reports she has gained 20 pounds in the last 18 months.  Patient's weight will be monitored closely. Demonstration and practice of PLB using pulse oximeter. Patient able to return demonstration satisfactorily. Safety and hand hygiene in the exercise area reviewed with patient. Patient voices understanding of the information reviewed. Department expectations discussed with patient and achievable goals were set. The patient shows enthusiasm about attending the program and we look forward to working with this nice  lady. The patient completed a 6 min walk test today, 07/17/2019 and did not require supplemental oxygen and to begin exercise on Tuesday, 07/25/2019 in the 1045 exercise slot.  [id number]

## 2019-07-25 ENCOUNTER — Encounter (HOSPITAL_COMMUNITY)
Admission: RE | Admit: 2019-07-25 | Discharge: 2019-07-25 | Disposition: A | Discharge status: Home / Self Care | Payer: Medicare Other | Source: Ambulatory Visit | Attending: Pulmonary Disease | Admitting: Pulmonary Disease

## 2019-07-25 ENCOUNTER — Other Ambulatory Visit: Payer: Self-pay

## 2019-07-25 DIAGNOSIS — J45909 Unspecified asthma, uncomplicated: Secondary | ICD-10-CM

## 2019-07-25 NOTE — Progress Notes (Signed)
Daily Session Note  Patient Details  Name: April [name] MRN: [id number] Date of Birth: [birth year] Referring Provider:     Pulmonary Rehab Walk Test from 07/17/2019 in [place]  Referring Provider April [name]      Encounter Date: 07/25/2019  Check In:  Session Check In - 07/25/19 1059      Check-In   Supervising physician immediately available to respond to emergencies [facility] immediately available    Physician(s) Dr. Pietro [name]    Location [facility]    Staff Present April Poles, RN, April Loser, MS, CEP, Exercise Physiologist;April Jani Gravel, MS, ACSM-CEP, Exercise Physiologist    Virtual Visit No    Medication changes reported     No    Fall or balance concerns reported    No    Tobacco Cessation No Change    Warm-up and Cool-down Performed as group-led instruction    Resistance Training Performed Yes    VAD Patient? No    PAD/SET Patient? No      Pain Assessment   Currently in Pain? No/denies    Multiple Pain Sites No           Capillary Blood Glucose: No results found for this or any previous visit (from the past 24 hour(s)).    Social History   Tobacco Use  Smoking Status Former Smoker  . Packs/day: 0.33  . Years: 34.00  . Pack years: 11.22  . Types: Cigarettes  . Quit date: 10/24/2017  . Years since quitting: 1.7  Smokeless Tobacco Never Used  Tobacco Comment   quit smoking october 2019    Goals Met:  Proper associated with RPD/PD & O2 Sat Exercise tolerated well Strength training completed today  Goals Unmet:  Not Applicable  Comments: Service time is from 1030 to 1150    Dr. Fransico [name] is Medical Director for Cardiac Rehab at [facility].

## 2019-07-26 ENCOUNTER — Encounter: Payer: Self-pay | Admitting: Surgical

## 2019-07-26 ENCOUNTER — Encounter: Payer: Self-pay | Admitting: Plastic Surgery

## 2019-07-26 ENCOUNTER — Ambulatory Visit (INDEPENDENT_AMBULATORY_CARE_PROVIDER_SITE_OTHER): Payer: Medicare Other | Admitting: Surgical

## 2019-07-26 VITALS — BP 108/57 | HR 82 | Temp 98.0°F

## 2019-07-26 DIAGNOSIS — N62 Hypertrophy of breast: Secondary | ICD-10-CM

## 2019-07-26 NOTE — Progress Notes (Signed)
Patient is a 64 year old female here for follow-up after bilateral breast reduction with Dr. Claudia Desanctis.  She reports she is doing well, but is here today for evaluation of her left lateral breast and left inframammary fold.  She reports that the left lateral breast has irritated her and she was concerned something was wrong.  She also had noticed a white object within her left breast incision and was concerned.  She reports that she has begun pulmonary therapy and this has been going well, she has only been doing lower body exercises at this time.  Pulmonary therapy is requesting a letter from our office stating she has no surgical restrictions at this time.  We will have front desk provide her with letter stating she has no restrictions.  On exam left lateral breast incision is C/D/I, no drainage or erythema noted.  Some tenderness to palpation.  Bilateral NAC's with good color, good cap refill.  Ms. Cercone is doing well.  There is no sign of any infection, seroma, hematoma.  Discussed with her the white object she had noted within the breast incision was likely a absorbable staple.  She reports that it had fallen out.  I discussed that if she sees any others that they will fall out.  She can apply Vaseline to the left lateral breast and cover with an ABD pad for protection.  She feels as if it may be some chafing due to movement.  Mrs. Hight is cleared for pulmonary therapy, no restrictions at this time.

## 2019-07-27 ENCOUNTER — Encounter (HOSPITAL_COMMUNITY)
Admission: RE | Admit: 2019-07-27 | Discharge: 2019-07-27 | Disposition: A | Discharge status: Home / Self Care | Payer: Medicare Other | Source: Ambulatory Visit | Attending: Pulmonary Disease | Admitting: Pulmonary Disease

## 2019-07-27 ENCOUNTER — Other Ambulatory Visit: Payer: Self-pay

## 2019-07-27 DIAGNOSIS — J45909 Unspecified asthma, uncomplicated: Secondary | ICD-10-CM

## 2019-07-27 NOTE — Progress Notes (Signed)
Daily Session Note  Patient Details  Name: April [name] MRN: [id number] Date of Birth: 10/20/1955 Referring Provider:     Pulmonary Rehab Walk Test from 07/17/2019 in [place]  Referring Provider [name]      Encounter Date: 07/27/2019  Check In:  Session Check In - 07/27/19 1137      Check-In   Supervising physician immediately available to respond to emergencies [facility] immediately available    Physician(s) Dr. Pietro Cassis    Location [facility]    Staff Present Rosebud Poles, RN, Bjorn Loser, MS, CEP, Exercise Physiologist;Lisa Jani Gravel, MS, ACSM-CEP, Exercise Physiologist    Virtual Visit No    Medication changes reported     No    Fall or balance concerns reported    No    Tobacco Cessation No Change    Warm-up and Cool-down Performed as group-led instruction    Resistance Training Performed Yes    VAD Patient? No    PAD/SET Patient? No      Pain Assessment   Currently in Pain? No/denies    Multiple Pain Sites No           Capillary Blood Glucose: No results found for this or any previous visit (from the past 24 hour(s)).    Social History   Tobacco Use  Smoking Status Former Smoker  . Packs/day: 0.33  . Years: 34.00  . Pack years: 11.22  . Types: Cigarettes  . Quit date: 10/24/2017  . Years since quitting: 1.7  Smokeless Tobacco Never Used  Tobacco Comment   quit smoking october 2019    Goals Met:  Independence with exercise equipment Exercise tolerated well Strength training completed today  Goals Unmet:  Not Applicable  Comments: Service time is from 1030 to 1145    Dr. Fransico Him is Medical Director for Cardiac Rehab at [facility].

## 2019-08-01 ENCOUNTER — Other Ambulatory Visit: Payer: Self-pay

## 2019-08-01 ENCOUNTER — Encounter (HOSPITAL_COMMUNITY)
Admission: RE | Admit: 2019-08-01 | Discharge: 2019-08-01 | Disposition: A | Discharge status: Home / Self Care | Payer: Medicare Other | Source: Ambulatory Visit | Attending: Pulmonary Disease | Admitting: Pulmonary Disease

## 2019-08-01 VITALS — Wt 183.4 lb

## 2019-08-01 DIAGNOSIS — J45909 Unspecified asthma, uncomplicated: Secondary | ICD-10-CM

## 2019-08-01 NOTE — Progress Notes (Signed)
Daily Session Note  Patient Details  Name: April [name] MRN: [id number] Date of Birth: [birth year] Referring Provider:     Pulmonary Rehab Walk Test from 07/17/2019 in [place]  Referring Provider [name]      Encounter Date: 08/01/2019  Check In:  Session Check In - 08/01/19 1105      Check-In   Supervising physician immediately available to respond to emergencies [facility] immediately available    Physician(s) Dr. Jamse Arn    [facility]    Staff Present Maurice Small, RN, Bjorn Loser, MS, CEP, Exercise Physiologist;Lisa Jani Gravel, MS, ACSM-CEP, Exercise Physiologist    Virtual Visit No    Medication changes reported     No    Fall or balance concerns reported    No    Tobacco Cessation No Change    Warm-up and Cool-down Performed on first and last piece of equipment    Resistance Training Performed Yes    VAD Patient? No    PAD/SET Patient? No      Pain Assessment   Currently in Pain? No/denies    Pain Score 0-No pain    Multiple Pain Sites No           Capillary Blood Glucose: No results found for this or any previous visit (from the past 24 hour(s)).   Exercise Prescription Changes - 08/01/19 1200      Response to Exercise   Blood Pressure (Admit) 116/76    Blood Pressure (Exercise) 136/78    Blood Pressure (Exit) 120/78    Heart Rate (Admit) 95 bpm    Heart Rate (Exercise) 112 bpm    Heart Rate (Exit) 99 bpm    Oxygen Saturation (Admit) 96 %    Oxygen Saturation (Exercise) 93 %    Oxygen Saturation (Exit) 97 %    Rating of Perceived Exertion (Exercise) 12    Perceived Dyspnea (Exercise) 3    Duration Continue with 30 min of aerobic exercise without signs/symptoms of physical distress.    Intensity THRR unchanged      Progression   Progression Continue to progress workloads to maintain intensity without signs/symptoms of physical distress.     Average METs 1.6      Resistance Training   Training Prescription Yes    Weight Orange bands    Reps 10-15    Time 10 Minutes      NuStep   Level 2    Minutes 15    METs 1.7      Track   Laps 14    Minutes 15           Social History   Tobacco Use  Smoking Status Former Smoker  . Packs/day: 0.33  . Years: 34.00  . Pack years: 11.22  . Types: Cigarettes  . Quit date: 10/24/2017  . Years since quitting: 1.7  Smokeless Tobacco Never Used  Tobacco Comment   quit smoking october 2019    Goals Met:  Proper associated with RPD/PD & O2 Sat Independence with exercise equipment Exercise tolerated well Strength training completed today  Goals Unmet:  Not Applicable  Comments: Service time is from 1025 to 1130    Dr. Fransico Him is Medical Director for Cardiac Rehab at [facility].

## 2019-08-03 ENCOUNTER — Encounter (HOSPITAL_COMMUNITY)
Admission: RE | Admit: 2019-08-03 | Discharge: 2019-08-03 | Disposition: A | Discharge status: Home / Self Care | Payer: Medicare Other | Source: Ambulatory Visit | Attending: Pulmonary Disease | Admitting: Pulmonary Disease

## 2019-08-03 ENCOUNTER — Other Ambulatory Visit: Payer: Self-pay

## 2019-08-03 DIAGNOSIS — J45909 Unspecified asthma, uncomplicated: Secondary | ICD-10-CM

## 2019-08-03 NOTE — Progress Notes (Signed)
Daily Session Note  Patient Details  Name: April [name] MRN: [id number] Date of Birth: May 12, 1955 Referring Provider:     Pulmonary Rehab Walk Test from 07/17/2019 in [place]  Referring Provider [name]      Encounter Date: 08/03/2019  Check In:  Session Check In - 08/03/19 1120      Check-In   Supervising physician immediately available to respond to emergencies [facility] immediately available    Physician(s) Dr. Wyline Copas    [facility]    Staff Present Maurice Small, RN, Bjorn Loser, MS, CEP, Exercise Physiologist;Avenir Lozinski Ysidro Evert, RN;David [place], MS, EP-C, CCRP    Virtual Visit No    Medication changes reported     No    Fall or balance concerns reported    No    Tobacco Cessation No Change    Warm-up and Cool-down Performed on first and last piece of equipment    Resistance Training Performed Yes    VAD Patient? No    PAD/SET Patient? No      Pain Assessment   Currently in Pain? No/denies    Pain Score 0-No pain    Multiple Pain Sites No           Capillary Blood Glucose: No results found for this or any previous visit (from the past 24 hour(s)).    Social History   Tobacco Use  Smoking Status Former Smoker  . Packs/day: 0.33  . Years: 34.00  . Pack years: 11.22  . Types: Cigarettes  . Quit date: 10/24/2017  . Years since quitting: 1.7  Smokeless Tobacco Never Used  Tobacco Comment   quit smoking october 2019    Goals Met:  Exercise tolerated well No report of cardiac concerns or symptoms Strength training completed today  Goals Unmet:  Not Applicable  Comments: Service time is from 1025 to 1145    Dr. Fransico Him is Medical Director for Cardiac Rehab at [facility].

## 2019-08-04 ENCOUNTER — Other Ambulatory Visit: Payer: Self-pay | Admitting: *Deleted

## 2019-08-04 MED ORDER — ALBUTEROL SULFATE HFA 108 (90 BASE) MCG/ACT IN AERS: 2.0000 | INHALATION_SPRAY | Freq: Four times a day (QID) | RESPIRATORY_TRACT | 3 refills | Status: DC | PRN

## 2019-08-07 ENCOUNTER — Ambulatory Visit: Payer: Medicare Other | Admitting: Pulmonary Disease

## 2019-08-08 ENCOUNTER — Other Ambulatory Visit: Payer: Self-pay

## 2019-08-08 ENCOUNTER — Encounter (HOSPITAL_COMMUNITY)
Admission: RE | Admit: 2019-08-08 | Discharge: 2019-08-08 | Disposition: A | Discharge status: Home / Self Care | Payer: Medicare Other | Source: Ambulatory Visit | Attending: Pulmonary Disease | Admitting: Pulmonary Disease

## 2019-08-08 DIAGNOSIS — J45909 Unspecified asthma, uncomplicated: Secondary | ICD-10-CM | POA: Diagnosis not present

## 2019-08-08 NOTE — Progress Notes (Signed)
Daily Session Note  Patient Details  Name: April [name] MRN: [id number] Date of Birth: [birth year] Referring Provider:     Pulmonary Rehab Walk Test from 07/17/2019 in [place]  Referring Provider [name]      Encounter Date: 08/08/2019  Check In:  Session Check In - 08/08/19 1119      Check-In   Supervising physician immediately available to respond to emergencies [facility] immediately available    Physician(s) Dr. Wyline Copas    [facility]    Staff Present Hoy Register, MS, CEP, Exercise Physiologist;Benelli Winther Jani Gravel, MS, ACSM-CEP, Exercise Physiologist    Virtual Visit No    Medication changes reported     No    Fall or balance concerns reported    No    Tobacco Cessation No Change    Warm-up and Cool-down Performed on first and last piece of equipment    Resistance Training Performed Yes    VAD Patient? No    PAD/SET Patient? No      Pain Assessment   Currently in Pain? No/denies    Pain Score 0-No pain    Multiple Pain Sites No           Capillary Blood Glucose: No results found for this or any previous visit (from the past 24 hour(s)).    Social History   Tobacco Use  Smoking Status Former Smoker  . Packs/day: 0.33  . Years: 34.00  . Pack years: 11.22  . Types: Cigarettes  . Quit date: 10/24/2017  . Years since quitting: 1.7  Smokeless Tobacco Never Used  Tobacco Comment   quit smoking october 2019    Goals Met:  Exercise tolerated well No report of cardiac concerns or symptoms Strength training completed today  Goals Unmet:  Not Applicable  Comments: Service time is from 1035 to 1153    Dr. Fransico Him is Medical Director for Cardiac Rehab at [facility].

## 2019-08-08 NOTE — Progress Notes (Signed)
[name] 64 y.o. female Nutrition Note   Visit Diagnosis: Uncomplicated asthma, unspecified asthma severity, unspecified whether persistent   Past Medical History:  Diagnosis Date  . Anxiety   . Anxiety and depression    related to caring for mother during terminal illness  . Arthritis   . Asthma   . AVN (avascular necrosis of bone) (HCC)    hip and wrist  . Bronchitis    hx of  . COPD (chronic obstructive pulmonary disease) ([place])   . Depression   . Endometriosis   . FH: CAD (coronary artery disease)   . GERD (gastroesophageal reflux disease)    ocassional  . History of blood transfusion    after hip repackment - broke out in hives and started itching  . History of palpitations   . Hypertension   . Hypothyroid   . Osteopenia    forteo through Dr. Estanislado Pandy (started 10/12)  . PMR (polymyalgia rheumatica) (HCC)   . Pneumonia 2019  . SIRS (systemic inflammatory response syndrome) ([place]) 10/2017  . Smoker   . SOB (shortness of breath) on exertion   . Squamous cell carcinoma    facial, 2016  . Temporal arteritis (HCC)    s/p prednisone taper     Medications reviewed.   Current Outpatient Medications:  .  acetaminophen (TYLENOL) 500 MG tablet, Take 1,000 mg by mouth every 6 (six) hours as needed for moderate pain or headache., Disp: , Rfl:  .  albuterol (PROVENTIL) (2.5 MG/3ML) 0.083% nebulizer solution, Take 3 mLs (2.5 mg total) by nebulization every 6 (six) hours as needed for wheezing or shortness of breath., Disp: 75 mL, Rfl: 3 .  albuterol (VENTOLIN HFA) 108 (90 Base) MCG/ACT inhaler, Inhale 2 puffs into the lungs every 6 (six) hours as needed for wheezing or shortness of breath. Okay to dispense proair/Ventolin/albuterol., Disp: 18 g, Rfl: 3 .  ALPRAZolam (XANAX) 0.5 MG tablet, TAKE 1/2 TO 1 TABLET BY  MOUTH TWICE DAILY AS NEEDED FOR ANXIETY (Patient taking differently: Take 0.25-0.5 mg by mouth 2 (two) times daily as needed for anxiety. ), Disp: 60  tablet, Rfl: 2 .  Budeson-Glycopyrrol-Formoterol (BREZTRI AEROSPHERE) 160-9-4.8 MCG/ACT AERO, Inhale 2 puffs into the lungs 2 (two) times daily., Disp: 17.3 g, Rfl: 5 .  buPROPion (WELLBUTRIN XL) 300 MG 24 hr tablet, TAKE 1 TABLET BY MOUTH  DAILY (Patient taking differently: Take 300 mg by mouth daily. ), Disp: 90 tablet, Rfl: 2 .  Calcium 500 MG CHEW, Chew 500 mg by mouth 2 (two) times daily. , Disp: , Rfl:  .  Cholecalciferol (DIALYVITE VITAMIN D 5000) 125 MCG (5000 UT) capsule, Take 5,000 Units by mouth 2 (two) times daily., Disp: , Rfl:  .  diclofenac Sodium (VOLTAREN) 1 % GEL, Apply 1 application topically daily as needed (pain)., Disp: , Rfl:  .  fluticasone (FLONASE) 50 MCG/ACT nasal spray, SPRAY 2 SPRAYS INTO EACH NOSTRIL EVERY DAY (Patient taking differently: Place 2 sprays into both nostrils daily as needed for allergies. ), Disp: 48 mL, Rfl: 3 .  levothyroxine (SYNTHROID) 125 MCG tablet, TAKE 1 TABLET BY MOUTH  DAILY EXCEPT TAKE 1 AND 1/2 TABLETS ON SUNDAY (Patient taking differently: Take 125-250 mcg by mouth See admin instructions. Take 125 mcg daily except take 250 mcg on Sunday), Disp: 98 tablet, Rfl: 2 .  meclizine (ANTIVERT) 25 MG tablet, Take 0.5-1 tablets (12.5-25 mg total) by mouth 3 (three) times daily as needed for dizziness., Disp: 30 tablet, Rfl: 1 .  metoprolol succinate (TOPROL-XL) 25 MG 24 hr tablet, Take 1 tablet (25 mg total) by mouth daily., Disp: 90 tablet, Rfl: 2 .  Multiple Vitamin (MULTIVITAMIN) tablet, Take 1 tablet by mouth daily., Disp: , Rfl:  .  Polyethyl Glycol-Propyl Glycol (SYSTANE OP), Place 1 drop into both eyes daily as needed (dryness). , Disp: , Rfl:  .  Spacer/Aero-Holding Chambers (AEROCHAMBER MV) inhaler, Use as instructed, Disp: 1 each, Rfl: 0 .  tiZANidine (ZANAFLEX) 4 MG tablet, TAKE 1/2 TO 1 TABLET BY  MOUTH EVERY 6 HOURS AS  NEEDED FOR MUSCLE SPASMS (Patient taking differently: Take 2-4 mg by mouth every 6 (six) hours as needed for muscle spasms. ),  Disp: 120 tablet, Rfl: 1 .  valsartan (DIOVAN) 40 MG tablet, Take 1 tablet (40 mg total) by mouth daily., Disp: 30 tablet, Rfl: 11 .  vitamin C (ASCORBIC ACID) 250 MG tablet, Take 500 mg by mouth daily., Disp: , Rfl:    Ht Readings from Last 1 Encounters:  07/17/19 5' 5.5" (1.664 m)     Wt Readings from Last 3 Encounters:  08/01/19 183 lb 6.8 oz (83.2 kg)  07/17/19 185 lb 3 oz (84 kg)  07/06/19 186 lb 12.8 oz (84.7 kg)     There is no height or weight on file to calculate BMI.   Social History   Tobacco Use  Smoking Status Former Smoker  . Packs/day: 0.33  . Years: 34.00  . Pack years: 11.22  . Types: Cigarettes  . Quit date: 10/24/2017  . Years since quitting: 1.7  Smokeless Tobacco Never Used  Tobacco Comment   quit smoking october 2019     Lab Results  Component Value Date   CHOL 198 08/23/2018   Lab Results  Component Value Date   HDL 62.60 08/23/2018   Lab Results  Component Value Date   LDLCALC 122 (H) 08/23/2018   Lab Results  Component Value Date   TRIG 68.0 08/23/2018     No results found for: HGBA1C   CBG (last 3)  No results for input(s): GLUCAP in the last 72 hours.   Nutrition Note  Spoke with pt. Nutrition Plan and Nutrition Survey goals reviewed with pt. Pt is following a Heart Healthy diet. Pt wants to lose wt. Pt has been trying to lose wt by choosing more salads for meals and yogurt for snack. She reports gaining 20 lbs during pandemic and wants to lose about 15-20 lbs now. No difficulty with GERD.  No difficulty cooking/preparing food. She has some difficulty grocery shopping and reports having to use rescue inhaler by the time she unloads groceries at home. Encouraged pt to utilize grocery store staff to help get groceries to car in efforts of preserving energy to unload at home.   Pt has had elevated CBGs documented in EMR. She reports her PCP has mentioned this to her in the past.  Per diet recall, many meals are high in  carbohydrates. She typically eats breakfast and dinner and has a morning and afternoon snack. She reports low energy by mid afternoon.  She drinks 1 12 oz pepsi per day.  She does not use the salt shaker during cooking but uses some on plate. She reads labels occasionally. We reviewed lower sodium recommendation of 300 mg/label.  Poor sleeping habits - 12 am-5 am.  Pt expressed understanding of the information reviewed.    Nutrition Diagnosis ? Obese  I = 30-34.9 related to excessive energy intake as evidenced by a  BMI 30.06 kg/m2  Nutrition Intervention ? Pt's individual nutrition plan reviewed with pt. ? Benefits of adopting Heart Healthy diet discussed when Medficts reviewed.   ? Continue client-centered nutrition education by RD, as part of interdisciplinary care.  Goal(s) ? Pt to identify food quantities necessary to achieve weight loss of 6-24 lb at graduation from cardiac rehab.  ? Pt to build a healthy plate including vegetables, fruits, whole grains, and low-fat dairy products in a heart healthy meal plan. ? Pt to reduce carb intake to <240 per day (~60 per meal, 30 per snack)  Plan:  Will provide client-centered nutrition education as part of interdisciplinary care  Monitor and evaluate progress toward nutrition goal with team.   Michaele Offer, MS, RDN, LDN

## 2019-08-10 ENCOUNTER — Other Ambulatory Visit: Payer: Self-pay

## 2019-08-10 ENCOUNTER — Encounter (HOSPITAL_COMMUNITY)
Admission: RE | Admit: 2019-08-10 | Discharge: 2019-08-10 | Disposition: A | Discharge status: Home / Self Care | Payer: Medicare Other | Source: Ambulatory Visit | Attending: Pulmonary Disease | Admitting: Pulmonary Disease

## 2019-08-10 DIAGNOSIS — J45909 Unspecified asthma, uncomplicated: Secondary | ICD-10-CM | POA: Diagnosis not present

## 2019-08-10 NOTE — Progress Notes (Signed)
Daily Session Note  Patient Details  Name: April [name] MRN: [id number] Date of Birth: 05-Sep-1955 Referring Provider:     Pulmonary Rehab Walk Test from 07/17/2019 in [place]  Referring Provider [name]      Encounter Date: 08/10/2019  Check In:  Session Check In - 08/10/19 1035      Check-In   Supervising physician immediately available to respond to emergencies [facility] immediately available    Physician(s) Dr. Lupita Dawn    Location [facility]    Staff Present Hoy Register, MS, CEP, Exercise Physiologist;Lisa Jani Gravel, MS, ACSM-CEP, Exercise Physiologist    Virtual Visit No    Medication changes reported     No    Fall or balance concerns reported    No    Tobacco Cessation No Change    Warm-up and Cool-down Performed on first and last piece of equipment    Resistance Training Performed Yes    VAD Patient? No    PAD/SET Patient? No      Pain Assessment   Currently in Pain? No/denies    Multiple Pain Sites No           Capillary Blood Glucose: No results found for this or any previous visit (from the past 24 hour(s)).    Social History   Tobacco Use  Smoking Status Former Smoker  . Packs/day: 0.33  . Years: 34.00  . Pack years: 11.22  . Types: Cigarettes  . Quit date: 10/24/2017  . Years since quitting: 1.7  Smokeless Tobacco Never Used  Tobacco Comment   quit smoking october 2019    Goals Met:  Independence with exercise equipment Exercise tolerated well Strength training completed today  Goals Unmet:  Not Applicable  Comments: Service time is from 1030 to 1140    Dr. Fransico Him is Medical Director for Cardiac Rehab at [facility].

## 2019-08-11 ENCOUNTER — Ambulatory Visit: Payer: Medicare Other | Admitting: Adult Health

## 2019-08-11 ENCOUNTER — Ambulatory Visit: Payer: Medicare Other | Admitting: Pulmonary Disease

## 2019-08-15 ENCOUNTER — Encounter (HOSPITAL_COMMUNITY)
Admission: RE | Admit: 2019-08-15 | Discharge: 2019-08-15 | Disposition: A | Discharge status: Home / Self Care | Payer: Medicare Other | Source: Ambulatory Visit | Attending: Pulmonary Disease | Admitting: Pulmonary Disease

## 2019-08-15 ENCOUNTER — Other Ambulatory Visit: Payer: Self-pay

## 2019-08-15 VITALS — Wt 185.0 lb

## 2019-08-15 DIAGNOSIS — J45909 Unspecified asthma, uncomplicated: Secondary | ICD-10-CM | POA: Diagnosis not present

## 2019-08-15 NOTE — Progress Notes (Signed)
Nutrition Note  Reviewed food log with pt. She started eating lunch this week which helped her avoid fatigue in the afternoons. She reports more energy eating consistently throughout the day and incorporating balanced meals with carbs/protein vs dessert/pepsi.  She only had a pepsi every other day to cut back on sugary beverages. She reports caffeine withdrawal headaches. This week she will try to do 1/2 a bottle each day.  We reviewed Plate Method. Goal to keep food log and follow plate method at 1 meal per day to increase non starchy veggie intake.   Nutrition Diagnosis   Obese  I = 30-34.9 related to excessive energy intake as evidenced by a  BMI 30.06 kg/m2  Nutrition Intervention   Pt's individual nutrition plan reviewed with pt.  Benefits of adopting Heart Healthy diet discussed when Medficts reviewed.                  Continue client-centered nutrition education by RD, as part of interdisciplinary care.  Goal(s)  Pt to identify food quantities necessary to achieve weight loss of 6-24 lb at graduation from cardiac rehab.   Pt to build a healthy plate including vegetables, fruits, whole grains, and low-fat dairy products in a heart healthy meal plan.  Pt to reduce carb intake to <240 per day (~60 per meal, 30 per snack)  Plan: Will provide client-centered nutrition education as part of interdisciplinary care  Monitor and evaluate progress toward nutrition goal with team.   Michaele Offer, MS, RDN, LDN

## 2019-08-15 NOTE — Progress Notes (Signed)
Daily Session Note  Patient Details  Name: April [name] MRN: [id number] Date of Birth: 1955-05-15 Referring Provider:     Pulmonary Rehab Walk Test from 07/17/2019 in [place]  Referring Provider [name]      Encounter Date: 08/15/2019  Check In:  Session Check In - 08/15/19 1032      Check-In   Supervising physician immediately available to respond to emergencies [facility] immediately available    Physician(s) Dr. Lupita Dawn    Location [facility]    Staff Present Maurice Small, RN, Bjorn Loser, MS, CEP, Exercise Physiologist;Lisa Jani Gravel, MS, ACSM-CEP, Exercise Physiologist    Virtual Visit No    Medication changes reported     No    Fall or balance concerns reported    No    Tobacco Cessation No Change    Warm-up and Cool-down Performed on first and last piece of equipment    Resistance Training Performed Yes    VAD Patient? No    PAD/SET Patient? No      Pain Assessment   Currently in Pain? No/denies    Multiple Pain Sites No           Capillary Blood Glucose: No results found for this or any previous visit (from the past 24 hour(s)).   Exercise Prescription Changes - 08/15/19 1200      Response to Exercise   Blood Pressure (Admit) 138/88    Blood Pressure (Exercise) 130/84    Blood Pressure (Exit) 138/80    Heart Rate (Admit) 91 bpm    Heart Rate (Exercise) 105 bpm    Heart Rate (Exit) 98 bpm    Oxygen Saturation (Admit) 95 %    Oxygen Saturation (Exercise) 95 %    Oxygen Saturation (Exit) 96 %    Rating of Perceived Exertion (Exercise) 11    Perceived Dyspnea (Exercise) 2    Duration Continue with 30 min of aerobic exercise without signs/symptoms of physical distress.    Intensity THRR unchanged      Progression   Average METs 1.8      Resistance Training   Weight Orange bands    Time 10 Minutes      NuStep   Level 3    Minutes 30    METs 1.8            Social History   Tobacco Use  Smoking Status Former Smoker  . Packs/day: 0.33  . Years: 34.00  . Pack years: 11.22  . Types: Cigarettes  . Quit date: 10/24/2017  . Years since quitting: 1.8  Smokeless Tobacco Never Used  Tobacco Comment   quit smoking october 2019    Goals Met:  Proper associated with RPD/PD & O2 Sat Exercise tolerated well Strength training completed today  Goals Unmet:  Not Applicable  Comments: Service time is from 39 to 10    Dr. Fransico Him is Medical Director for Cardiac Rehab at [facility].

## 2019-08-16 ENCOUNTER — Ambulatory Visit: Payer: Medicare Other | Admitting: Surgical

## 2019-08-16 NOTE — Progress Notes (Signed)
Pulmonary Individual Treatment Plan  Patient Details  Name: April [name] MRN: [id number] Date of Birth: Jun 20, 1955 Referring Provider:     Pulmonary Rehab Walk Test from 07/17/2019 in [facility]  Referring Provider [name]      Initial Encounter Date:    Pulmonary Rehab Walk Test from 07/17/2019 in [facility]  Date 07/17/19      Visit Diagnosis: Uncomplicated asthma, unspecified asthma severity, unspecified whether persistent  Patient's Home Medications on Admission:   Current Outpatient Medications:  .  acetaminophen (TYLENOL) 500 MG tablet, Take 1,000 mg by mouth every 6 (six) hours as needed for moderate pain or headache., Disp: , Rfl:  .  albuterol (PROVENTIL) (2.5 MG/3ML) 0.083% nebulizer solution, Take 3 mLs (2.5 mg total) by nebulization every 6 (six) hours as needed for wheezing or shortness of breath., Disp: 75 mL, Rfl: 3 .  albuterol (VENTOLIN HFA) 108 (90 Base) MCG/ACT inhaler, Inhale 2 puffs into the lungs every 6 (six) hours as needed for wheezing or shortness of breath. Okay to dispense proair/Ventolin/albuterol., Disp: 18 g, Rfl: 3 .  ALPRAZolam (XANAX) 0.5 MG tablet, TAKE 1/2 TO 1 TABLET BY  MOUTH TWICE DAILY AS NEEDED FOR ANXIETY (Patient taking differently: Take 0.25-0.5 mg by mouth 2 (two) times daily as needed for anxiety. ), Disp: 60 tablet, Rfl: 2 .  Budeson-Glycopyrrol-Formoterol (BREZTRI AEROSPHERE) 160-9-4.8 MCG/ACT AERO, Inhale 2 puffs into the lungs 2 (two) times daily., Disp: 17.3 g, Rfl: 5 .  buPROPion (WELLBUTRIN XL) 300 MG 24 hr tablet, TAKE 1 TABLET BY MOUTH  DAILY (Patient taking differently: Take 300 mg by mouth daily. ), Disp: 90 tablet, Rfl: 2 .  Calcium 500 MG CHEW, Chew 500 mg by mouth 2 (two) times daily. , Disp: , Rfl:  .  Cholecalciferol (DIALYVITE VITAMIN D 5000) 125 MCG (5000 UT) capsule, Take 5,000 Units by mouth 2 (two) times daily., Disp: , Rfl:  .  diclofenac  Sodium (VOLTAREN) 1 % GEL, Apply 1 application topically daily as needed (pain)., Disp: , Rfl:  .  fluticasone (FLONASE) 50 MCG/ACT nasal spray, SPRAY 2 SPRAYS INTO EACH NOSTRIL EVERY DAY (Patient taking differently: Place 2 sprays into both nostrils daily as needed for allergies. ), Disp: 48 mL, Rfl: 3 .  levothyroxine (SYNTHROID) 125 MCG tablet, TAKE 1 TABLET BY MOUTH  DAILY EXCEPT TAKE 1 AND 1/2 TABLETS ON SUNDAY (Patient taking differently: Take 125-250 mcg by mouth See admin instructions. Take 125 mcg daily except take 250 mcg on Sunday), Disp: 98 tablet, Rfl: 2 .  meclizine (ANTIVERT) 25 MG tablet, Take 0.5-1 tablets (12.5-25 mg total) by mouth 3 (three) times daily as needed for dizziness., Disp: 30 tablet, Rfl: 1 .  metoprolol succinate (TOPROL-XL) 25 MG 24 hr tablet, Take 1 tablet (25 mg total) by mouth daily., Disp: 90 tablet, Rfl: 2 .  Multiple Vitamin (MULTIVITAMIN) tablet, Take 1 tablet by mouth daily., Disp: , Rfl:  .  Polyethyl Glycol-Propyl Glycol (SYSTANE OP), Place 1 drop into both eyes daily as needed (dryness). , Disp: , Rfl:  .  Spacer/Aero-Holding Chambers (AEROCHAMBER MV) inhaler, Use as instructed, Disp: 1 each, Rfl: 0 .  tiZANidine (ZANAFLEX) 4 MG tablet, TAKE 1/2 TO 1 TABLET BY  MOUTH EVERY 6 HOURS AS  NEEDED FOR MUSCLE SPASMS (Patient taking differently: Take 2-4 mg by mouth every 6 (six) hours as needed for muscle spasms. ), Disp: 120 tablet, Rfl: 1 .  valsartan (DIOVAN) 40 MG  tablet, Take 1 tablet (40 mg total) by mouth daily., Disp: 30 tablet, Rfl: 11 .  vitamin C (ASCORBIC ACID) 250 MG tablet, Take 500 mg by mouth daily., Disp: , Rfl:   Past Medical History: Past Medical History:  Diagnosis Date  . Anxiety   . Anxiety and depression    related to caring for mother during terminal illness  . Arthritis   . Asthma   . AVN (avascular necrosis of bone) (HCC)    hip and wrist  . Bronchitis    hx of  . COPD (chronic obstructive pulmonary disease) ([place])   . Depression    . Endometriosis   . FH: CAD (coronary artery disease)   . GERD (gastroesophageal reflux disease)    ocassional  . History of blood transfusion    after hip repackment - broke out in hives and started itching  . History of palpitations   . Hypertension   . Hypothyroid   . Osteopenia    forteo through Dr. Estanislado Pandy (started 10/12)  . PMR (polymyalgia rheumatica) (HCC)   . Pneumonia 2019  . SIRS (systemic inflammatory response syndrome) ([place]) 10/2017  . Smoker   . SOB (shortness of breath) on exertion   . Squamous cell carcinoma    facial, 2016  . Temporal arteritis (HCC)    s/p prednisone taper    Tobacco Use: Social History   Tobacco Use  Smoking Status Former Smoker  . Packs/day: 0.33  . Years: 34.00  . Pack years: 11.22  . Types: Cigarettes  . Quit date: 10/24/2017  . Years since quitting: 1.8  Smokeless Tobacco Never Used  Tobacco Comment   quit smoking october 2019    Labs: Recent Review Scientist, physiological    Labs for ITP Cardiac and Pulmonary Rehab Latest Ref Rng & Units 03/30/2011 08/12/2015 08/12/2016 08/19/2017 08/23/2018   Cholestrol 0 - 200 mg/dL 186 202(H) 198 202(H) 198   LDLCALC 0 - 99 mg/dL 115(H) 139(H) 142(H) 133(H) 122(H)   HDL >39.00 mg/dL 62.20 49.50 43.70 56.70 62.60   Trlycerides 0 - 149 mg/dL 46.0 69.0 62.0 62.0 68.0      Capillary Blood Glucose: No results found for: GLUCAP   Pulmonary Assessment Scores:  Pulmonary Assessment Scores    Row Name 07/17/19 1146         ADL UCSD   ADL Phase Entry     SOB Score total 46       CAT Score   CAT Score 15       mMRC Score   mMRC Score 2           UCSD: Self-administered rating of dyspnea associated with activities of daily living (ADLs) 6-point scale (0 = "not at all" to 5 = "maximal or unable to do because of breathlessness")  Scoring Scores range from 0 to 120.  Minimally important difference is 5 units  CAT: CAT can identify the health impairment of COPD patients and is better  correlated with disease progression.  CAT has a scoring range of zero to 40. The CAT score is classified into four groups of low (less than 10), medium (10 - 20), high (21-30) and very high (31-40) based on the impact level of disease on health status. A CAT score over 10 suggests significant symptoms.  A worsening CAT score could be explained by an exacerbation, poor medication adherence, poor inhaler technique, or progression of COPD or comorbid conditions.  CAT MCID is 2 points  mMRC: mMRC (Modified Medical  Research Council) Dyspnea Scale is used to assess the degree of baseline functional disability in patients of respiratory disease due to dyspnea. No minimal important difference is established. A decrease in score of 1 point or greater is considered a positive change.   Pulmonary Function Assessment:  Pulmonary Function Assessment - 07/17/19 1157      Breath   Bilateral Breath Sounds Clear    Shortness of Breath Yes;Fear of Shortness of Breath;Limiting activity;Panic with Shortness of Breath           Exercise Target Goals: Exercise Program Goal: Individual exercise prescription set using results from initial 6 min walk test and THRR while considering  patient's activity barriers and safety.   Exercise Prescription Goal: Initial exercise prescription builds to 30-45 minutes a day of aerobic activity, 2-3 days per week.  Home exercise guidelines will be given to patient during program as part of exercise prescription that the participant will acknowledge.  Activity Barriers & Risk Stratification:  Activity Barriers & Cardiac Risk Stratification - 07/17/19 1125      Activity Barriers & Cardiac Risk Stratification   Activity Barriers Left Hip Replacement;Right Hip Replacement;Arthritis;Deconditioning;Muscular Weakness;Shortness of Breath;History of Falls           6 Minute Walk:  6 Minute Walk    Row Name 07/17/19 1145         6 Minute Walk   Phase Initial     Distance  1064 feet     Walk Time 6 minutes     # of Rest Breaks 1     MPH 2.05     METS 3.07     RPE 11     Perceived Dyspnea  2     VO2 Peak 10.75     Symptoms Yes (comment)     Comments SOB, took 30 second break. RPD = 2     Resting HR 88 bpm     Resting BP 122/70     Resting Oxygen Saturation  98 %     Exercise Oxygen Saturation  during 6 min walk 91 %     Max Ex. HR 122 bpm     Max Ex. BP 168/94     2 Minute Post BP 140/84       Interval HR   1 Minute HR 109     2 Minute HR 115     3 Minute HR 120     4 Minute HR 122     5 Minute HR 120     6 Minute HR 117     2 Minute Post HR 100     Interval Heart Rate? Yes       Interval Oxygen   Interval Oxygen? Yes     Baseline Oxygen Saturation % 98 %     1 Minute Oxygen Saturation % 95 %     1 Minute Liters of Oxygen 0 L     2 Minute Oxygen Saturation % 93 %     2 Minute Liters of Oxygen 0 L     3 Minute Oxygen Saturation % 92 %     3 Minute Liters of Oxygen 0 L     4 Minute Oxygen Saturation % 93 %     4 Minute Liters of Oxygen 0 L     5 Minute Oxygen Saturation % 91 %     5 Minute Liters of Oxygen 0 L     6 Minute Oxygen Saturation % 91 %  6 Minute Liters of Oxygen 0 L     2 Minute Post Oxygen Saturation % 98 %     2 Minute Post Liters of Oxygen 0 L            Oxygen Initial Assessment:  Oxygen Initial Assessment - 07/17/19 1128      Home Oxygen   Home Oxygen Device None    Sleep Oxygen Prescription None    Home Exercise Oxygen Prescription None    Home at Rest Exercise Oxygen Prescription None      Initial 6 min Walk   Oxygen Used None      Program Oxygen Prescription   Program Oxygen Prescription None      Intervention   Short Term Goals To learn and understand importance of monitoring SPO2 with pulse oximeter and demonstrate accurate use of the pulse oximeter.;To learn and understand importance of maintaining oxygen saturations>88%;To learn and demonstrate proper pursed lip breathing techniques or other  breathing techniques.;To learn and demonstrate proper use of respiratory medications    Long  Term Goals Verbalizes importance of monitoring SPO2 with pulse oximeter and return demonstration;Maintenance of O2 saturations>88%;Exhibits proper breathing techniques, such as pursed lip breathing or other method taught during program session;Compliance with respiratory medication;Demonstrates proper use of MDI's           Oxygen Re-Evaluation:  Oxygen Re-Evaluation    Row Name 08/15/19 0834             Program Oxygen Prescription   Program Oxygen Prescription None         Home Oxygen   Home Oxygen Device None       Sleep Oxygen Prescription None       Home Exercise Oxygen Prescription None       Home at Rest Exercise Oxygen Prescription None         Goals/Expected Outcomes   Short Term Goals To learn and exhibit compliance with exercise, home and travel O2 prescription;To learn and understand importance of monitoring SPO2 with pulse oximeter and demonstrate accurate use of the pulse oximeter.;To learn and understand importance of maintaining oxygen saturations>88%;To learn and demonstrate proper pursed lip breathing techniques or other breathing techniques.       Long  Term Goals Exhibits compliance with exercise, home and travel O2 prescription;Verbalizes importance of monitoring SPO2 with pulse oximeter and return demonstration;Maintenance of O2 saturations>88%;Exhibits proper breathing techniques, such as pursed lip breathing or other method taught during program session;Compliance with respiratory medication;Demonstrates proper use of MDI's       Goals/Expected Outcomes compliance              Oxygen Discharge (Final Oxygen Re-Evaluation):  Oxygen Re-Evaluation - 08/15/19 0834      Program Oxygen Prescription   Program Oxygen Prescription None      Home Oxygen   Home Oxygen Device None    Sleep Oxygen Prescription None    Home Exercise Oxygen Prescription None    Home at  Rest Exercise Oxygen Prescription None      Goals/Expected Outcomes   Short Term Goals To learn and exhibit compliance with exercise, home and travel O2 prescription;To learn and understand importance of monitoring SPO2 with pulse oximeter and demonstrate accurate use of the pulse oximeter.;To learn and understand importance of maintaining oxygen saturations>88%;To learn and demonstrate proper pursed lip breathing techniques or other breathing techniques.    Long  Term Goals Exhibits compliance with exercise, home and travel O2 prescription;Verbalizes importance of monitoring SPO2  with pulse oximeter and return demonstration;Maintenance of O2 saturations>88%;Exhibits proper breathing techniques, such as pursed lip breathing or other method taught during program session;Compliance with respiratory medication;Demonstrates proper use of MDI's    Goals/Expected Outcomes compliance           Initial Exercise Prescription:  Initial Exercise Prescription - 07/17/19 1200      Date of Initial Exercise RX and Referring Provider   Date 07/17/19    Referring Provider [name]    Expected Discharge Date 09/21/19      NuStep   Level 2    Minutes 30    METs 1.8      Prescription Details   Frequency (times per week) 2    Duration Progress to 30 minutes of continuous aerobic without signs/symptoms of physical distress      Intensity   THRR 40-80% of Max Heartrate 62-123    Ratings of Perceived Exertion 11-13    Perceived Dyspnea 0-4      Progression   Progression Continue progressive overload as per policy without signs/symptoms or physical distress.      Resistance Training   Training Prescription Yes    Weight Theraband    Reps 10-15           Perform Capillary Blood Glucose checks as needed.  Exercise Prescription Changes:  Exercise Prescription Changes    Row Name 08/01/19 1200 08/15/19 1200           Response to Exercise   Blood Pressure (Admit) 116/76 138/88       Blood Pressure (Exercise) 136/78 130/84      Blood Pressure (Exit) 120/78 138/80      Heart Rate (Admit) 95 bpm 91 bpm      Heart Rate (Exercise) 112 bpm 105 bpm      Heart Rate (Exit) 99 bpm 98 bpm      Oxygen Saturation (Admit) 96 % 95 %      Oxygen Saturation (Exercise) 93 % 95 %      Oxygen Saturation (Exit) 97 % 96 %      Rating of Perceived Exertion (Exercise) 12 11      Perceived Dyspnea (Exercise) 3 2      Duration Continue with 30 min of aerobic exercise without signs/symptoms of physical distress. Continue with 30 min of aerobic exercise without signs/symptoms of physical distress.      Intensity THRR unchanged THRR unchanged        Progression   Progression Continue to progress workloads to maintain intensity without signs/symptoms of physical distress. --      Average METs 1.6 1.8        Resistance Training   Training Prescription Yes --      Weight Orange bands Orange bands      Reps 10-15 --      Time 10 Minutes 10 Minutes        NuStep   Level 2 3      Minutes 15 30      METs 1.7 1.8        Track   Laps 14 --      Minutes 15 --             Exercise Comments:  Exercise Comments    Row Name 07/25/19 1153 08/16/19 1331         Exercise Comments Pt completed first day of pulmonary rehab. Exercise was tolerated well but unable to perform upper body exercise until she recieves  doctor clearance Pt exercises with elastic bands at home.  April [name] has tried to walk outside but has been unable to due to the high temperature and humiditiy level.  Asked pt to explore other options for walking inside such as a big box store ( wearing face mask properly).             Exercise Goals and Review:  Exercise Goals    Row Name 07/17/19 1243 08/15/19 0840           Exercise Goals   Increase Physical Activity Yes Yes      Intervention Provide advice, education, support and counseling about physical activity/exercise needs.;Develop an individualized exercise prescription  for aerobic and resistive training based on initial evaluation findings, risk stratification, comorbidities and participant's personal goals. Provide advice, education, support and counseling about physical activity/exercise needs.;Develop an individualized exercise prescription for aerobic and resistive training based on initial evaluation findings, risk stratification, comorbidities and participant's personal goals.      Expected Outcomes Short Term: Attend rehab on a regular basis to increase amount of physical activity.;Long Term: Add in home exercise to make exercise part of routine and to increase amount of physical activity.;Long Term: Exercising regularly at least 3-5 days a week. Short Term: Attend rehab on a regular basis to increase amount of physical activity.;Long Term: Add in home exercise to make exercise part of routine and to increase amount of physical activity.;Long Term: Exercising regularly at least 3-5 days a week.      Increase Strength and Stamina Yes Yes      Intervention Provide advice, education, support and counseling about physical activity/exercise needs.;Develop an individualized exercise prescription for aerobic and resistive training based on initial evaluation findings, risk stratification, comorbidities and participant's personal goals. Provide advice, education, support and counseling about physical activity/exercise needs.;Develop an individualized exercise prescription for aerobic and resistive training based on initial evaluation findings, risk stratification, comorbidities and participant's personal goals.      Expected Outcomes Short Term: Increase workloads from initial exercise prescription for resistance, speed, and METs.;Short Term: Perform resistance training exercises routinely during rehab and add in resistance training at home;Long Term: Improve cardiorespiratory fitness, muscular endurance and strength as measured by increased METs and functional capacity (6MWT)  Short Term: Increase workloads from initial exercise prescription for resistance, speed, and METs.;Short Term: Perform resistance training exercises routinely during rehab and add in resistance training at home;Long Term: Improve cardiorespiratory fitness, muscular endurance and strength as measured by increased METs and functional capacity (6MWT)      Able to understand and use rate of perceived exertion (RPE) scale Yes Yes      Intervention Provide education and explanation on how to use RPE scale Provide education and explanation on how to use RPE scale      Expected Outcomes Short Term: Able to use RPE daily in rehab to express subjective intensity level;Long Term:  Able to use RPE to guide intensity level when exercising independently Short Term: Able to use RPE daily in rehab to express subjective intensity level;Long Term:  Able to use RPE to guide intensity level when exercising independently      Able to understand and use Dyspnea scale Yes Yes      Intervention Provide education and explanation on how to use Dyspnea scale Provide education and explanation on how to use Dyspnea scale      Expected Outcomes Short Term: Able to use Dyspnea scale daily in rehab to express subjective sense of shortness  of breath during exertion;Long Term: Able to use Dyspnea scale to guide intensity level when exercising independently Short Term: Able to use Dyspnea scale daily in rehab to express subjective sense of shortness of breath during exertion;Long Term: Able to use Dyspnea scale to guide intensity level when exercising independently      Knowledge and understanding of Target Heart Rate Range (THRR) Yes Yes      Intervention Provide education and explanation of THRR including how the numbers were predicted and where they are located for reference Provide education and explanation of THRR including how the numbers were predicted and where they are located for reference      Expected Outcomes Short Term: Able to  state/look up THRR;Long Term: Able to use THRR to govern intensity when exercising independently;Short Term: Able to use daily as guideline for intensity in rehab Short Term: Able to state/look up THRR;Long Term: Able to use THRR to govern intensity when exercising independently;Short Term: Able to use daily as guideline for intensity in rehab      Intervention -- Provide education and demonstration on how to check pulse in carotid and radial arteries.;Review the importance of being able to check your own pulse for safety during independent exercise      Expected Outcomes -- Short Term: Able to explain why pulse checking is important during independent exercise;Long Term: Able to check pulse independently and accurately      Understanding of Exercise Prescription Yes Yes      Intervention Provide education, explanation, and written materials on patient's individual exercise prescription Provide education, explanation, and written materials on patient's individual exercise prescription      Expected Outcomes Short Term: Able to explain program exercise prescription;Long Term: Able to explain home exercise prescription to exercise independently Short Term: Able to explain program exercise prescription;Long Term: Able to explain home exercise prescription to exercise independently             Exercise Goals Re-Evaluation :  Exercise Goals Re-Evaluation    Row Name 08/15/19 0836             Exercise Goal Re-Evaluation   Exercise Goals Review Increase Physical Activity;Increase Strength and Stamina;Able to understand and use rate of perceived exertion (RPE) scale;Able to understand and use Dyspnea scale;Knowledge and understanding of Target Heart Rate Range (THRR);Understanding of Exercise Prescription       Comments Pt has completed 5 exercise sessions in pulmonary rehab and is tolerating exercise well. She gets short of breath while walking the track but her SpO2 does not drop to the point of being  put on supplemental oxygen       Expected Outcomes Through exercise at rehab and at home the patient will decrease shortness of breath with daily activities and feel confident in carying out an exercise regimn at home              Discharge Exercise Prescription (Final Exercise Prescription Changes):  Exercise Prescription Changes - 08/15/19 1200      Response to Exercise   Blood Pressure (Admit) 138/88    Blood Pressure (Exercise) 130/84    Blood Pressure (Exit) 138/80    Heart Rate (Admit) 91 bpm    Heart Rate (Exercise) 105 bpm    Heart Rate (Exit) 98 bpm    Oxygen Saturation (Admit) 95 %    Oxygen Saturation (Exercise) 95 %    Oxygen Saturation (Exit) 96 %    Rating of Perceived Exertion (Exercise) 11  Perceived Dyspnea (Exercise) 2    Duration Continue with 30 min of aerobic exercise without signs/symptoms of physical distress.    Intensity THRR unchanged      Progression   Average METs 1.8      Resistance Training   Weight Orange bands    Time 10 Minutes      NuStep   Level 3    Minutes 30    METs 1.8           Nutrition:  Target Goals: Understanding of nutrition guidelines, daily intake of sodium 1500mg , cholesterol 200mg , calories 30% from fat and 7% or less from saturated fats, daily to have 5 or more servings of fruits and vegetables.  Biometrics:  Pre Biometrics - 07/17/19 1126      Pre Biometrics   Height 5' 5.5" (1.664 m)    Weight 84 kg    BMI (Calculated) 30.34    Grip Strength 28 kg            Nutrition Therapy Plan and Nutrition Goals:  Nutrition Therapy & Goals - 08/15/19 1436      Nutrition Therapy   Diet General Healthful/Carb modified      Personal Nutrition Goals   Nutrition Goal Pt to identify food quantities necessary to achieve weight loss of 6-24 lb at graduation from pulmonary rehab.    Personal Goal #2 Pt to build a healthy plate including vegetables, fruits, whole grains, and low-fat dairy products in a heart healthy  meal plan.    Personal Goal #3 Pt to reduce carb intake to <240 per day (~60 per meal, 30 per snack)      Intervention Plan   Intervention Prescribe, educate and counsel regarding individualized specific dietary modifications aiming towards targeted core components such as weight, hypertension, lipid management, diabetes, heart failure and other comorbidities.;Nutrition handout(s) given to patient.    Expected Outcomes Short Term Goal: A plan has been developed with personal nutrition goals set during dietitian appointment.;Long Term Goal: Adherence to prescribed nutrition plan.           Nutrition Assessments:  Nutrition Assessments - 08/15/19 1438      Rate Your Plate Scores   Pre Score 55           Nutrition Goals Re-Evaluation:  Nutrition Goals Re-Evaluation    [place] Name 08/15/19 1437             Goals   Current Weight 184 lb (83.5 kg)       Nutrition Goal Pt to identify food quantities necessary to achieve weight loss of 6-24 lb at graduation from pulmonary rehab.         Personal Goal #2 Re-Evaluation   Personal Goal #2 Pt to build a healthy plate including vegetables, fruits, whole grains, and low-fat dairy products in a heart healthy meal plan.         Personal Goal #3 Re-Evaluation   Personal Goal #3 Pt to reduce carb intake to <240 per day (~60 per meal, 30 per snack)              Nutrition Goals Discharge (Final Nutrition Goals Re-Evaluation):  Nutrition Goals Re-Evaluation - 08/15/19 1437      Goals   Current Weight 184 lb (83.5 kg)    Nutrition Goal Pt to identify food quantities necessary to achieve weight loss of 6-24 lb at graduation from pulmonary rehab.      Personal Goal #2 Re-Evaluation   Personal Goal #2 Pt  to build a healthy plate including vegetables, fruits, whole grains, and low-fat dairy products in a heart healthy meal plan.      Personal Goal #3 Re-Evaluation   Personal Goal #3 Pt to reduce carb intake to <240 per day (~60 per meal, 30  per snack)           Psychosocial: Target Goals: Acknowledge presence or absence of significant depression and/or stress, maximize coping skills, provide positive support system. Participant is able to verbalize types and ability to use techniques and skills needed for reducing stress and depression.  Initial Review & Psychosocial Screening:  Initial Psych Review & Screening - 07/17/19 1129      Initial Review   Current issues with History of Depression;Current Depression;Current Anxiety/Panic   Depression is stable on current medications     Family Dynamics   Good Support System? No    Strains Intra-family strains   divorced, sons side with her x-husband   Concerns No support system    Comments Wants to start going back to church      Barriers   Psychosocial barriers to participate in program There are no identifiable barriers or psychosocial needs.      Screening Interventions   Interventions Encouraged to exercise           Quality of Life Scores:  Scores of 19 and below usually indicate a poorer quality of life in these areas.  A difference of  2-3 points is a clinically meaningful difference.  A difference of 2-3 points in the total score of the Quality of Life Index has been associated with significant improvement in overall quality of life, self-image, physical symptoms, and general health in studies assessing change in quality of life.  PHQ-9: Recent Review Flowsheet Data    Depression screen [facility] 2/9 07/17/2019 08/26/2018 08/24/2017 08/17/2016   Decreased Interest 0 0 0 0   Down, Depressed, Hopeless 0 0 0 0   PHQ - 2 Score 0 0 0 0   Altered sleeping 0 - - -   Tired, decreased energy 0 - - -   Change in appetite 0 - - -   Feeling bad or failure about yourself  0 - - -   Trouble concentrating 0 - - -   Moving slowly or fidgety/restless 0 - - -   Suicidal thoughts 0 - - -   PHQ-9 Score 0 - - -   Difficult doing work/chores Not difficult at all - - -      Interpretation of Total Score  Total Score Depression Severity:  1-4 = Minimal depression, 5-9 = Mild depression, 10-14 = Moderate depression, 15-19 = Moderately severe depression, 20-27 = Severe depression   Psychosocial Evaluation and Intervention:  Psychosocial Evaluation - 07/17/19 1133      Psychosocial Evaluation & Interventions   Interventions Stress management education;Relaxation education    Comments Depression is currently stable on current medications    Continue Psychosocial Services  Follow up required by staff           Psychosocial Re-Evaluation:  Psychosocial Re-Evaluation    [place] Name 08/16/19 1334             Psychosocial Re-Evaluation   Current issues with History of Depression       Comments April [name] has history of depression and currently is manganged well with Welbutrin. Pt has supportive friend however the friend doesn't like to "do something".  April [name] would like to explore opening her circle of  friends to have more opportunities to engage in activity outside the home.       Expected Outcomes April [name] will continue to maintain positive healthy coping skills and report no pschosocial barriers to participating in Pulmonary rehab.       Interventions Encouraged to attend Pulmonary Rehabilitation for the exercise;Stress management education;Relaxation education       Continue Psychosocial Services  Follow up required by staff              Psychosocial Discharge (Final Psychosocial Re-Evaluation):  Psychosocial Re-Evaluation - 08/16/19 1334      Psychosocial Re-Evaluation   Current issues with History of Depression    Comments April [name] has history of depression and currently is manganged well with Welbutrin. Pt has supportive friend however the friend doesn't like to "do something".  Mardell would like to explore opening her circle of friends to have more opportunities to engage in activity outside the home.    Expected Outcomes April [name] will continue to maintain  positive healthy coping skills and report no pschosocial barriers to participating in Pulmonary rehab.    Interventions Encouraged to attend Pulmonary Rehabilitation for the exercise;Stress management education;Relaxation education    Continue Psychosocial Services  Follow up required by staff           Education: Education Goals: Education classes will be provided on a weekly basis, covering required topics. Participant will state understanding/return demonstration of topics presented.  Learning Barriers/Preferences:   Education Topics: Risk Factor Reduction:  -Group instruction that is supported by a PowerPoint presentation. Instructor discusses the definition of a risk factor, different risk factors for pulmonary disease, and how the heart and lungs work together.     Nutrition for Pulmonary Patient:  -Group instruction provided by PowerPoint slides, verbal discussion, and written materials to support subject matter. The instructor gives an explanation and review of healthy diet recommendations, which includes a discussion on weight management, recommendations for fruit and vegetable consumption, as well as protein, fluid, caffeine, fiber, sodium, sugar, and alcohol. Tips for eating when patients are short of breath are discussed.   Pursed Lip Breathing:  -Group instruction that is supported by demonstration and informational handouts. Instructor discusses the benefits of pursed lip and diaphragmatic breathing and detailed demonstration on how to preform both.     Oxygen Safety:  -Group instruction provided by PowerPoint, verbal discussion, and written material to support subject matter. There is an overview of "What is Oxygen" and "Why do we need it".  Instructor also reviews how to create a safe environment for oxygen use, the importance of using oxygen as prescribed, and the risks of noncompliance. There is a brief discussion on traveling with oxygen and resources the patient may  utilize.   Oxygen Equipment:  -Group instruction provided by [facility] Staff utilizing handouts, written materials, and equipment demonstrations.   Signs and Symptoms:  -Group instruction provided by written material and verbal discussion to support subject matter. Warning signs and symptoms of infection, stroke, and heart attack are reviewed and when to call the physician/911 reinforced. Tips for preventing the spread of infection discussed.   Advanced Directives:  -Group instruction provided by verbal instruction and written material to support subject matter. Instructor reviews Advanced Directive laws and proper instruction for filling out document.   Pulmonary Video:  -Group video education that reviews the importance of medication and oxygen compliance, exercise, good nutrition, pulmonary hygiene, and pursed lip and diaphragmatic breathing for the pulmonary patient.   Exercise for the  Pulmonary Patient:  -Group instruction that is supported by a PowerPoint presentation. Instructor discusses benefits of exercise, core components of exercise, frequency, duration, and intensity of an exercise routine, importance of utilizing pulse oximetry during exercise, safety while exercising, and options of places to exercise outside of rehab.     Pulmonary Medications:  -Verbally interactive group education provided by instructor with focus on inhaled medications and proper administration.   Anatomy and Physiology of the Respiratory System and Intimacy:  -Group instruction provided by PowerPoint, verbal discussion, and written material to support subject matter. Instructor reviews respiratory cycle and anatomical components of the respiratory system and their functions. Instructor also reviews differences in obstructive and restrictive respiratory diseases with examples of each. Intimacy, Sex, and Sexuality differences are reviewed with a discussion on how relationships can change when diagnosed  with pulmonary disease. Common sexual concerns are reviewed.   MD DAY -A group question and answer session with a medical doctor that allows participants to ask questions that relate to their pulmonary disease state.   OTHER EDUCATION -Group or individual verbal, written, or video instructions that support the educational goals of the pulmonary rehab program.   PULMONARY REHAB OTHER RESPIRATORY from 08/10/2019 in [place]  Date 08/10/19  April [name] easy]  Educator Handout  Instruction Review Code 2- Demonstrated Understanding      Holiday Eating Survival Tips:  -Group instruction provided by PowerPoint slides, verbal discussion, and written materials to support subject matter. The instructor gives patients tips, tricks, and techniques to help them not only survive but enjoy the holidays despite the onslaught of food that accompanies the holidays.   Knowledge Questionnaire Score:  Knowledge Questionnaire Score - 07/17/19 1154      Knowledge Questionnaire Score   Pre Score 7/18           Core Components/Risk Factors/Patient Goals at Admission:  Personal Goals and Risk Factors at Admission - 07/17/19 1156      Core Components/Risk Factors/Patient Goals on Admission   Improve shortness of breath with ADL's Yes    Intervention Provide education, individualized exercise plan and daily activity instruction to help decrease symptoms of SOB with activities of daily living.    Expected Outcomes Short Term: Improve cardiorespiratory fitness to achieve a reduction of symptoms when performing ADLs;Long Term: Be able to perform more ADLs without symptoms or delay the onset of symptoms           Core Components/Risk Factors/Patient Goals Review:   Goals and Risk Factor Review    Row Name 07/17/19 1134 07/17/19 1157 08/16/19 1338         Core Components/Risk Factors/Patient Goals Review   Personal Goals Review Improve shortness of breath with  ADL's;Develop more efficient breathing techniques such as purse lipped breathing and diaphragmatic breathing and practicing self-pacing with activity.;Increase knowledge of respiratory medications and ability to use respiratory devices properly. Increase knowledge of respiratory medications and ability to use respiratory devices properly.;Improve shortness of breath with ADL's;Develop more efficient breathing techniques such as purse lipped breathing and diaphragmatic breathing and practicing self-pacing with activity. Increase knowledge of respiratory medications and ability to use respiratory devices properly.;Improve shortness of breath with ADL's;Develop more efficient breathing techniques such as purse lipped breathing and diaphragmatic breathing and practicing self-pacing with activity.     Review -- -- Pt continues to struggle with her breathing despite normal O2 saturations.  Pt has upcoming rescheduled appt to talk to her pulmonologist about her shortness of breath.  However pt does report that she cleaned her bathroom and her shower - this is something that she has not been able to do in the past.  Pt needs reminders for breathing techniques such as PLB.  will continue to reinforce usage of these techniques.     Expected Outcomes -- -- See Admission Outcomes            Core Components/Risk Factors/Patient Goals at Discharge (Final Review):   Goals and Risk Factor Review - 08/16/19 1338      Core Components/Risk Factors/Patient Goals Review   Personal Goals Review Increase knowledge of respiratory medications and ability to use respiratory devices properly.;Improve shortness of breath with ADL's;Develop more efficient breathing techniques such as purse lipped breathing and diaphragmatic breathing and practicing self-pacing with activity.    Review Pt continues to struggle with her breathing despite normal O2 saturations.  Pt has upcoming rescheduled appt to talk to her pulmonologist about her  shortness of breath.  However pt does report that she cleaned her bathroom and her shower - this is something that she has not been able to do in the past.  Pt needs reminders for breathing techniques such as PLB.  will continue to reinforce usage of these techniques.    Expected Outcomes See Admission Outcomes           ITP Comments:  ITP Comments    Row Name 08/16/19 1308           ITP Comments Dr. Rodman Pickle, Medical Director [facility]              Comments:  April [name] has completed 7 exercise session in Pulmonary rehab. Pt maintains good attendance and is trying to increase her activity at home. Pulmonary rehab staff will  continue to monitor and reassess progress toward goals during her participation in Pulmonary Rehab. April [name], BSN Cardiac and Training and development officer

## 2019-08-17 ENCOUNTER — Encounter (HOSPITAL_COMMUNITY)
Admission: RE | Admit: 2019-08-17 | Discharge: 2019-08-17 | Disposition: A | Discharge status: Home / Self Care | Payer: Medicare Other | Source: Ambulatory Visit | Attending: Pulmonary Disease | Admitting: Pulmonary Disease

## 2019-08-17 ENCOUNTER — Other Ambulatory Visit: Payer: Self-pay

## 2019-08-17 DIAGNOSIS — J45909 Unspecified asthma, uncomplicated: Secondary | ICD-10-CM

## 2019-08-17 NOTE — Progress Notes (Signed)
Daily Session Note  Patient Details  Name: Deandria Klute MRN: [id number] Date of Birth: Nov 06, 1955 Referring Provider:     Pulmonary Rehab Walk Test from 07/17/2019 in [place]  Referring Provider [name]      Encounter Date: 08/17/2019  Check In:  Session Check In - 08/17/19 1107      Check-In   Supervising physician immediately available to respond to emergencies [facility] immediately available    Physician(s) Dr. Claybon Jabs    Location [facility]    Staff Present Maurice Small, RN, Bjorn Loser, MS, CEP, Exercise Physiologist;Lisa Jani Gravel, MS, ACSM-CEP, Exercise Physiologist    Virtual Visit No    Medication changes reported     No    Fall or balance concerns reported    No    Tobacco Cessation No Change    Warm-up and Cool-down Performed on first and last piece of equipment    Resistance Training Performed Yes    VAD Patient? No    PAD/SET Patient? No      Pain Assessment   Currently in Pain? No/denies    Multiple Pain Sites No           Capillary Blood Glucose: No results found for this or any previous visit (from the past 24 hour(s)).    Social History   Tobacco Use  Smoking Status Former Smoker  . Packs/day: 0.33  . Years: 34.00  . Pack years: 11.22  . Types: Cigarettes  . Quit date: 10/24/2017  . Years since quitting: 1.8  Smokeless Tobacco Never Used  Tobacco Comment   quit smoking october 2019    Goals Met:  Proper associated with RPD/PD & O2 Sat Exercise tolerated well No report of cardiac concerns or symptoms Strength training completed today  Goals Unmet:  Not Applicable  Comments: Service time is from 1030 to 1137    Dr. Fransico Him is Medical Director for Cardiac Rehab at [facility].

## 2019-08-22 ENCOUNTER — Encounter (HOSPITAL_COMMUNITY)
Admission: RE | Admit: 2019-08-22 | Discharge: 2019-08-22 | Disposition: A | Discharge status: Home / Self Care | Payer: Medicare Other | Source: Ambulatory Visit | Attending: Pulmonary Disease | Admitting: Pulmonary Disease

## 2019-08-22 ENCOUNTER — Other Ambulatory Visit: Payer: Self-pay

## 2019-08-22 DIAGNOSIS — J45909 Unspecified asthma, uncomplicated: Secondary | ICD-10-CM | POA: Diagnosis not present

## 2019-08-22 NOTE — Progress Notes (Signed)
Daily Session Note  Patient Details  Name: April [name] MRN: [id number] Date of Birth: 1955/10/27 Referring Provider:     Pulmonary Rehab Walk Test from 07/17/2019 in [place]  Referring Provider [name]      Encounter Date: 08/22/2019  Check In:  Session Check In - 08/22/19 1125      Check-In   Supervising physician immediately available to respond to emergencies [facility] immediately available    Physician(s) Dr. Claybon Jabs    Location [facility]    Staff Present Maurice Small, RN, Bjorn Loser, MS, CEP, Exercise Physiologist;Kushi Kun Jani Gravel, MS, ACSM-CEP, Exercise Physiologist    Virtual Visit No    Medication changes reported     No    Fall or balance concerns reported    No    Tobacco Cessation No Change    Warm-up and Cool-down Performed on first and last piece of equipment    Resistance Training Performed Yes    VAD Patient? No    PAD/SET Patient? No      Pain Assessment   Currently in Pain? No/denies    Multiple Pain Sites No           Capillary Blood Glucose: No results found for this or any previous visit (from the past 24 hour(s)).    Social History   Tobacco Use  Smoking Status Former Smoker  . Packs/day: 0.33  . Years: 34.00  . Pack years: 11.22  . Types: Cigarettes  . Quit date: 10/24/2017  . Years since quitting: 1.8  Smokeless Tobacco Never Used  Tobacco Comment   quit smoking october 2019    Goals Met:  Exercise tolerated well No report of cardiac concerns or symptoms Strength training completed today  Goals Unmet:  Not Applicable  Comments: Service time is from 1030 to 1136    Dr. Fransico Him is Medical Director for Cardiac Rehab at [facility].

## 2019-08-24 ENCOUNTER — Other Ambulatory Visit: Payer: Self-pay

## 2019-08-24 ENCOUNTER — Encounter (HOSPITAL_COMMUNITY)
Admission: RE | Admit: 2019-08-24 | Discharge: 2019-08-24 | Disposition: A | Discharge status: Home / Self Care | Payer: Medicare Other | Source: Ambulatory Visit | Attending: Pulmonary Disease | Admitting: Pulmonary Disease

## 2019-08-24 DIAGNOSIS — J45909 Unspecified asthma, uncomplicated: Secondary | ICD-10-CM | POA: Diagnosis not present

## 2019-08-24 NOTE — Progress Notes (Signed)
Daily Session Note   Patient Details  Name: April [name] MRN: [id number] Date of Birth: 1955-05-12 Referring Provider:     Pulmonary Rehab Walk Test from 07/17/2019 in [place]  Referring Provider [name]      Encounter Date: 08/24/2019  Check In:   Capillary Blood Glucose: No results found for this or any previous visit (from the past 24 hour(s)).    Social History   Tobacco Use  Smoking Status Former Smoker  . Packs/day: 0.33  . Years: 34.00  . Pack years: 11.22  . Types: Cigarettes  . Quit date: 10/24/2017  . Years since quitting: 1.8  Smokeless Tobacco Never Used  Tobacco Comment   quit smoking october 2019    Goals Met:  Independence with exercise equipment Exercise tolerated well No report of cardiac concerns or symptoms Strength training completed today  Goals Unmet:  Not Applicable  Comments: Service time is from 1030 to 1135    Dr. Fransico Him is Medical Director for Cardiac Rehab at [facility].

## 2019-08-24 NOTE — Progress Notes (Signed)
Nutrition Note-Follow up  Spoke with pt and reviewed diet recall. She has been keeping a food log but forgot to bring it today. She has recognized that protein at lunch gives her more energy throughout the afternoon. She feels like working on dinner would be helpful. She had a difficult time following plate method as it is dificult for her to cook without fatigue. We reviewed ways to incorporate easy veggies. Pt amenable to these ideas. She also decided it may be helpful to use smaller plates. Encouraged pt to eat when she feels hungry and be proactive about eating meals and snacks before getting overly hungry. Overall energy levels are better with these diet changes.  Nutrition Diagnosis   ObeseI = 30-34.9related to excessive energy intake as evidenced by a BMI 30.06 kg/m2  Nutrition Intervention   Pts individual nutrition plan reviewed with pt.  Benefits of adopting Heart Healthy diet discussed when Medficts reviewed.  Continue client-centered nutrition education by RD, as part of interdisciplinary care.  Goal(s)  Pt to identify food quantities necessary to achieve weight loss of 6-24 lb at graduation from cardiac rehab.   Pt to build a healthy plate including vegetables, fruits, whole grains, and low-fat dairy products in a heart healthy meal plan.  Pt to reduce carb intake to <240 per day (~60 per meal, 30 per snack)  Plan: Will provide client-centered nutrition education as part of interdisciplinary care  Monitor and evaluate progress toward nutrition goal with team.   Michaele Offer, MS, RDN, LDN

## 2019-08-25 ENCOUNTER — Encounter: Payer: Self-pay | Admitting: Pulmonary Disease

## 2019-08-25 ENCOUNTER — Ambulatory Visit (INDEPENDENT_AMBULATORY_CARE_PROVIDER_SITE_OTHER): Payer: Medicare Other

## 2019-08-25 ENCOUNTER — Ambulatory Visit: Payer: Medicare Other | Admitting: Pulmonary Disease

## 2019-08-25 VITALS — BP 122/78 | HR 66 | Temp 97.6°F | Ht 66.0 in | Wt 185.0 lb

## 2019-08-25 DIAGNOSIS — Z79899 Other long term (current) drug therapy: Secondary | ICD-10-CM | POA: Insufficient documentation

## 2019-08-25 DIAGNOSIS — R0602 Shortness of breath: Secondary | ICD-10-CM

## 2019-08-25 DIAGNOSIS — J439 Emphysema, unspecified: Secondary | ICD-10-CM | POA: Diagnosis not present

## 2019-08-25 LAB — COMPREHENSIVE METABOLIC PANEL
ALT: 29 U/L (ref 0–35)
AST: 27 U/L (ref 0–37)
Albumin: 4.2 g/dL (ref 3.5–5.2)
Alkaline Phosphatase: 100 U/L (ref 39–117)
BUN: 19 mg/dL (ref 6–23)
CO2: 28 mEq/L (ref 19–32)
Calcium: 9.8 mg/dL (ref 8.4–10.5)
Chloride: 104 mEq/L (ref 96–112)
Creatinine, Ser: 1.29 mg/dL — ABNORMAL HIGH (ref 0.40–1.20)
GFR: 41.29 mL/min — ABNORMAL LOW (ref >60.00)
Glucose, Bld: 104 mg/dL — ABNORMAL HIGH (ref 70–99)
Potassium: 4.3 mEq/L (ref 3.5–5.1)
Sodium: 140 mEq/L (ref 135–145)
Total Bilirubin: 0.4 mg/dL (ref 0.2–1.2)
Total Protein: 7.3 g/dL (ref 6.0–8.3)

## 2019-08-25 LAB — CBC WITH DIFFERENTIAL/PLATELET
Basophils Absolute: 0.1 10*3/uL (ref 0.0–0.1)
Basophils Relative: 1.1 % (ref 0.0–3.0)
Eosinophils Absolute: 0.2 10*3/uL (ref 0.0–0.7)
Eosinophils Relative: 2.5 % (ref 0.0–5.0)
HCT: 39.5 % (ref 36.0–46.0)
Hemoglobin: 13.1 g/dL (ref 12.0–15.0)
Lymphocytes Relative: 19.1 % (ref 12.0–46.0)
Lymphs Abs: 1.3 10*3/uL (ref 0.7–4.0)
MCHC: 33.2 g/dL (ref 30.0–36.0)
MCV: 92.3 fl (ref 78.0–100.0)
Monocytes Absolute: 0.6 10*3/uL (ref 0.1–1.0)
Monocytes Relative: 8.2 % (ref 3.0–12.0)
Neutro Abs: 4.8 10*3/uL (ref 1.4–7.7)
Neutrophils Relative %: 69.1 % (ref 43.0–77.0)
Platelets: 210 10*3/uL (ref 150.0–400.0)
RBC: 4.27 Mil/uL (ref 3.87–5.11)
RDW: 14 % (ref 11.5–15.5)
WBC: 7 10*3/uL (ref 4.0–10.5)

## 2019-08-25 LAB — BRAIN NATRIURETIC PEPTIDE: Pro B Natriuretic peptide (BNP): 35 pg/mL (ref 0.0–100.0)

## 2019-08-25 LAB — TSH: TSH: 0.04 u[IU]/mL — ABNORMAL LOW (ref 0.35–4.50)

## 2019-08-25 MED ORDER — PREDNISONE 10 MG PO TABS: ORAL_TABLET | ORAL | 0 refills | Status: DC

## 2019-08-25 MED ORDER — BREZTRI AEROSPHERE 160-9-4.8 MCG/ACT IN AERO: 2.0000 | INHALATION_SPRAY | Freq: Two times a day (BID) | RESPIRATORY_TRACT | 0 refills | Status: DC

## 2019-08-25 NOTE — Patient Instructions (Addendum)
You were seen today by Lauraine Rinne, NP  for:   1. Pulmonary emphysema, unspecified emphysema type ([place])  - predniSONE (DELTASONE) 10 MG tablet; Take 2 tablets (19m total) daily for the next 5 days. Take in the AM with food.  Dispense: 10 tablet; Refill: 0  Walk today in office  Chest x-ray today  Lab work today  Breztri >>> 2 puffs in the morning right when you wake up, rinse out your mouth after use, 12 hours later 2 puffs, rinse after use >>> Take this daily, no matter what >>> This is not a rescue inhaler   [facility]  Note your daily symptoms > remember "red flags" for COPD:   >>>Increase in cough >>>increase in sputum production >>>increase in shortness of breath or activity  intolerance.   If you notice these symptoms, please call the office to be seen.    2. SOB (shortness of breath)  - CBC with Differential/Platelet; Future - Comp Met (CMET); Future - B Nat Peptide; Future - predniSONE (DELTASONE) 10 MG tablet; Take 2 tablets (242mtotal) daily for the next 5 days. Take in the AM with food.  Dispense: 10 tablet; Refill: 0 - TSH; Future - DG Chest 2 View; Future - ECHOCARDIOGRAM COMPLETE; Future  3. Medication management  - AMB Referral to [facility]   We recommend today:  Orders Placed This Encounter  Procedures  . DG Chest 2 View    Standing Status:   Future    Standing Expiration Date:   12/25/2019    Order Specific Question:   Reason for Exam (SYMPTOM  OR DIAGNOSIS REQUIRED)    Answer:   doe / copd    Order Specific Question:   Preferred imaging location?    Answer:   Internal    Order Specific Question:   Radiology Contrast Protocol - do NOT remove file path    Answer:   _[email or web address]  . CBC with Differential/Platelet    Standing Status:   Future    Standing Expiration Date:   08/24/2020  . Comp Met (CMET)    Standing Status:   Future    Standing Expiration Date:    08/24/2020  . B Nat Peptide    Standing Status:   Future    Standing Expiration Date:   08/24/2020  . TSH    Standing Status:   Future    Standing Expiration Date:   08/24/2020  . AMB Referral to [facility]    Referral Priority:   Routine    Referral Type:   Consultation    Referral Reason:   [facility]    Number of Visits Requested:   1  . ECHOCARDIOGRAM COMPLETE    Standing Status:   Future    Standing Expiration Date:   12/25/2019    Order Specific Question:   Where should this test be performed    [facility]   Order Specific Question:   Does the patient weigh less than or greater than 250 lbs?    Answer:   Patient weighs less than 250 lbs    Order Specific Question:   Perflutren DEFINITY (image enhancing agent) should be administered unless hypersensitivity or allergy exist    Answer:   Administer Perflutren    Order Specific Question:   Reason for exam-Echo    Answer:   Dyspnea  786.09 / R06.00    Order Specific Question:  Release to patient    Answer:   Immediate   Orders Placed This Encounter  Procedures  . DG Chest 2 View  . CBC with Differential/Platelet  . Comp Met (CMET)  . B Nat Peptide  . TSH  . AMB Referral to [place] [facility]  . ECHOCARDIOGRAM COMPLETE   Meds ordered this encounter  Medications  . predniSONE (DELTASONE) 10 MG tablet    Sig: Take 2 tablets (70m total) daily for the next 5 days. Take in the AM with food.    Dispense:  10 tablet    Refill:  0    Follow Up:    Return in about 3 months (around 11/25/2019), or if symptoms worsen or fail to improve, for Follow up with Dr. IValeta Harms   Please do your part to reduce the spread of COVID-19:      Reduce your risk of any infection  and COVID19 by using the similar precautions used for avoiding the common cold or flu:  .Marland KitchenWash your hands often with soap and warm water for at least 20 seconds.  If soap and water are not readily available, use  an alcohol-based hand sanitizer with at least 60% alcohol.  . If coughing or sneezing, cover your mouth and nose by coughing or sneezing into the elbow areas of your shirt or coat, into a tissue or into your sleeve (not your hands). .Langley GaussA MASK when in public  . Avoid shaking hands with others and consider head nods or verbal greetings only. . Avoid touching your eyes, nose, or mouth with unwashed hands.  . Avoid close contact with people who are sick. . Avoid places or events with large numbers of people in one location, like concerts or sporting events. . If you have some symptoms but not all symptoms, continue to monitor at home and seek medical attention if your symptoms worsen. . If you are having a medical emergency, call 911.   [facility]/ e-Visit: [email or web address]        [facility]: [facility] [phone]                  [facility] [phone]    It is flu season:   >>> Best ways to protect herself from the flu: Receive the yearly flu vaccine, practice good hand hygiene washing with soap and also using hand sanitizer when available, eat a nutritious meals, get adequate rest, hydrate appropriately   Please contact the office if your symptoms worsen or you have concerns that you are not improving.   Thank you for choosing [facility] for your healthcare, and for allowing [facility] partner with you on your healthcare journey. I am thankful to be able to provide care to you today.   BWyn QuakerFNP-C

## 2019-08-25 NOTE — Addendum Note (Signed)
Addended by: Suzzanne Cloud E on: 08/25/2019 10:08 AM   Modules accepted: Orders

## 2019-08-25 NOTE — Assessment & Plan Note (Signed)
Plan: [facility] for medication cost Breztri samples provided today

## 2019-08-25 NOTE — Assessment & Plan Note (Signed)
Suspect patient's shortness of breath is multifactorial given the fact that she has severe COPD.  Preserved ejection fraction congestive heart failure based off of 2019 echocardiogram.  She is not on diuretics.  Her lung sounds are clear to auscultation today.  Given her increased rescue inhaler use as well as her self-reported wheezing we can do a short course of steroids while we further evaluate other causes of her dyspnea.  Plan: Lab work today Walk today in office Chest x-ray today Echocardiogram Based off echocardiogram we will proceed forward with sending patient back to cardiology

## 2019-08-25 NOTE — Progress Notes (Signed)
@Patient  ID: April [name], female    DOB: Mar 17, 1955, 64 y.o.   MRN: [id number]  Chief Complaint  Patient presents with  . Follow-up    pt states sob since second vaccine. goes to pulm rehab. pt my need referral for cardiologist    Referring provider: [name], MD  HPI:  64 year old female former smoker followed in our office for COPD  PMH: Anxiety, hypertension, degenerative disc disease, polymyalgia rheumatica, chronic kidney disease, GERD Smoker/ Smoking History: Former smoker.  Quit 2019 Maintenance:  Breztri  Pt of: Dr. Valeta Harms  08/25/2019  - Visit   64 year old female former smoker followed in our office for COPD.  She is followed by Dr. Valeta Harms.  She is presenting today as a follow-up visit.  She is reporting that she has had increased shortness of breath since receiving her second Covid vaccine.  She continues to go to pulmonary rehab.   Patient also reporting today that she is now in the donut hole.  She is struggling with affording her inhalers.  She reports it to be $190 a month for her 1 inhaler.  She cannot afford this.  We will discuss and evaluate this today.  Patient reporting that is very difficult for her to complete pulmonary rehab.  She also has multiple episodes of wheezing.  She is using her rescue inhaler at least daily sometimes up to 3 times a day.  This is a change for her.  We will discuss and review this today.  She denies any cough, congestion, recent antibiotic use.  The nurse at pulmonary rehab has suggested that she be evaluated by cardiology or have an echocardiogram   Questionaires / Pulmonary Flowsheets:   ACT:  No flowsheet data found.  MMRC: mMRC Dyspnea Scale mMRC Score  08/25/2019 3  04/06/2019 0  12/07/2018 1    Epworth:  No flowsheet data found.  Tests:   10/05/2018-CT chest without contrast-multiple small pulmonary nodules within the right lung, largest on prior substudy in the medial right apex is improved and  decreased in size, other pulmonary nodules are stable, these could be followed with a repeat CT in 6 to 12 months, severe centrilobular emphysema  12/07/2018-pulmonary function test-FVC 1.73 (49% predicted), postbronchodilator ratio 47, postbronchodilator FEV1 0.93 (34% predicted), positive bronchodilator response, mid flow reversibility, DLCO 10.76 (49% predicted)  FENO:  No results found for: NITRICOXIDE  PFT: PFT Results Latest Ref Rng & Units 12/07/2018  FVC-Pre L 1.73  FVC-Predicted Pre % 49  FVC-Post L 1.99  FVC-Predicted Post % 56  Pre FEV1/FVC % % 46  Post FEV1/FCV % % 47  FEV1-Pre L 0.80  FEV1-Predicted Pre % 29  FEV1-Post L 0.93  DLCO uncorrected ml/min/mmHg 10.76  DLCO UNC% % 49  DLVA Predicted % 73  TLC L 6.80  TLC % Predicted % 123  RV % Predicted % 222    WALK:  SIX MIN WALK 07/17/2019  2 Minute Oxygen Saturation % 93  2 Minute HR 115  4 Minute Oxygen Saturation % 93  4 Minute HR 122  6 Minute Oxygen Saturation % 91  6 Minute HR 117    Imaging: No results found.  Lab Results:  CBC    Component Value Date/Time   WBC 6.7 06/13/2019 0955   RBC 4.25 06/13/2019 0955   HGB 13.1 06/13/2019 0955   HCT 41.3 06/13/2019 0955   PLT 189 06/13/2019 0955   MCV 97.2 06/13/2019 0955   MCH 30.8 06/13/2019 0955  MCHC 31.7 06/13/2019 0955   RDW 12.8 06/13/2019 0955   LYMPHSABS 1.5 12/09/2017 0945   MONOABS 0.6 12/09/2017 0945   EOSABS 0.1 12/09/2017 0945   BASOSABS 0.1 12/09/2017 0945    BMET    Component Value Date/Time   NA 140 06/13/2019 0955   NA 140 02/03/2019 1427   K 4.1 06/13/2019 0955   CL 106 06/13/2019 0955   CO2 22 06/13/2019 0955   GLUCOSE 98 06/13/2019 0955   BUN 24 (H) 06/13/2019 0955   BUN 15 02/03/2019 1427   CREATININE 1.30 (H) 06/13/2019 0955   CREATININE 1.13 (H) 06/03/2016 1213   CALCIUM 9.4 06/13/2019 0955   GFRNONAA 43 (L) 06/13/2019 0955   GFRNONAA 52 (L) 06/03/2016 1213   GFRAA 50 (L) 06/13/2019 0955   GFRAA 60 06/03/2016  1213    BNP No results found for: BNP  ProBNP No results found for: PROBNP  Specialty Problems      Pulmonary Problems   Asthma    Qualifier: Diagnosis of  By: Damita Dunnings MD, Phillip Heal        COPD (chronic obstructive pulmonary disease) ([place])   SOB (shortness of breath)   CAP (community acquired pneumonia)    L basilar xray findings       Pulmonary nodule    On CT 12/2017      Allergic rhinitis      Allergies  Allergen Reactions  . Sulfonamide Derivatives     As a child.  Throat closed, rash.  . Alendronate Sodium     GI upset  . Dilaudid [Hydromorphone Hcl] Nausea And Vomiting    Immunization History  Administered Date(s) Administered  . Fluad Quad(high Dose 65+) 08/26/2018  . Influenza Split 10/15/2010  . Influenza,inj,Quad PF,6+ Mos 10/05/2012, 10/24/2013, 10/12/2014, 11/12/2015, 10/16/2016, 10/14/2017  . Influenza-Unspecified 08/26/2018  . PFIZER SARS-COV-2 Vaccination 03/05/2019, 04/04/2019  . Pneumococcal Conjugate-13 10/26/2018  . Pneumococcal Polysaccharide-23 01/05/2009  . Tdap 04/06/2011    Past Medical History:  Diagnosis Date  . Anxiety   . Anxiety and depression    related to caring for mother during terminal illness  . Arthritis   . Asthma   . AVN (avascular necrosis of bone) (HCC)    hip and wrist  . Bronchitis    hx of  . COPD (chronic obstructive pulmonary disease) ([place])   . Depression   . Endometriosis   . FH: CAD (coronary artery disease)   . GERD (gastroesophageal reflux disease)    ocassional  . History of blood transfusion    after hip repackment - broke out in hives and started itching  . History of palpitations   . Hypertension   . Hypothyroid   . Osteopenia    forteo through Dr. Estanislado Pandy (started 10/12)  . PMR (polymyalgia rheumatica) (HCC)   . Pneumonia 2019  . SIRS (systemic inflammatory response syndrome) ([place]) 10/2017  . Smoker   . SOB (shortness of breath) on exertion   . Squamous cell carcinoma    facial, 2016   . Temporal arteritis (HCC)    s/p prednisone taper    Tobacco History: Social History   Tobacco Use  Smoking Status Former Smoker  . Packs/day: 0.33  . Years: 34.00  . Pack years: 11.22  . Types: Cigarettes  . Quit date: 10/24/2017  . Years since quitting: 1.8  Smokeless Tobacco Never Used  Tobacco Comment   quit smoking october 2019   Counseling given: Not Answered Comment: quit smoking october 2019   Continue  to not smoke  Outpatient Encounter Medications as of 08/25/2019  Medication Sig  . acetaminophen (TYLENOL) 500 MG tablet Take 1,000 mg by mouth every 6 (six) hours as needed for moderate pain or headache.  . albuterol (PROVENTIL) (2.5 MG/3ML) 0.083% nebulizer solution Take 3 mLs (2.5 mg total) by nebulization every 6 (six) hours as needed for wheezing or shortness of breath.  Marland Kitchen albuterol (VENTOLIN HFA) 108 (90 Base) MCG/ACT inhaler Inhale 2 puffs into the lungs every 6 (six) hours as needed for wheezing or shortness of breath. Okay to dispense proair/Ventolin/albuterol.  . ALPRAZolam (XANAX) 0.5 MG tablet TAKE 1/2 TO 1 TABLET BY  MOUTH TWICE DAILY AS NEEDED FOR ANXIETY (Patient taking differently: Take 0.25-0.5 mg by mouth 2 (two) times daily as needed for anxiety. )  . Budeson-Glycopyrrol-Formoterol (BREZTRI AEROSPHERE) 160-9-4.8 MCG/ACT AERO Inhale 2 puffs into the lungs 2 (two) times daily.  Marland Kitchen buPROPion (WELLBUTRIN XL) 300 MG 24 hr tablet TAKE 1 TABLET BY MOUTH  DAILY (Patient taking differently: Take 300 mg by mouth daily. )  . Calcium 500 MG CHEW Chew 500 mg by mouth 2 (two) times daily.   . Cholecalciferol (DIALYVITE VITAMIN D 5000) 125 MCG (5000 UT) capsule Take 5,000 Units by mouth 2 (two) times daily.  . diclofenac Sodium (VOLTAREN) 1 % GEL Apply 1 application topically daily as needed (pain).  . fluticasone (FLONASE) 50 MCG/ACT nasal spray SPRAY 2 SPRAYS INTO EACH NOSTRIL EVERY DAY (Patient taking differently: Place 2 sprays into both nostrils daily as needed for  allergies. )  . levothyroxine (SYNTHROID) 125 MCG tablet TAKE 1 TABLET BY MOUTH  DAILY EXCEPT TAKE 1 AND 1/2 TABLETS ON SUNDAY (Patient taking differently: Take 125-250 mcg by mouth See admin instructions. Take 125 mcg daily except take 250 mcg on Sunday)  . meclizine (ANTIVERT) 25 MG tablet Take 0.5-1 tablets (12.5-25 mg total) by mouth 3 (three) times daily as needed for dizziness.  . metoprolol succinate (TOPROL-XL) 25 MG 24 hr tablet Take 1 tablet (25 mg total) by mouth daily.  . Multiple Vitamin (MULTIVITAMIN) tablet Take 1 tablet by mouth daily.  Vladimir Faster Glycol-Propyl Glycol (SYSTANE OP) Place 1 drop into both eyes daily as needed (dryness).   Marland Kitchen Spacer/Aero-Holding Chambers (AEROCHAMBER MV) inhaler Use as instructed  . tiZANidine (ZANAFLEX) 4 MG tablet TAKE 1/2 TO 1 TABLET BY  MOUTH EVERY 6 HOURS AS  NEEDED FOR MUSCLE SPASMS (Patient taking differently: Take 2-4 mg by mouth every 6 (six) hours as needed for muscle spasms. )  . valsartan (DIOVAN) 40 MG tablet Take 1 tablet (40 mg total) by mouth daily.  . vitamin C (ASCORBIC ACID) 250 MG tablet Take 500 mg by mouth daily.  . predniSONE (DELTASONE) 10 MG tablet Take 2 tablets (20mg  total) daily for the next 5 days. Take in the AM with food.   No facility-administered encounter medications on file as of 08/25/2019.     Review of Systems  Review of Systems  Constitutional: Positive for fatigue. Negative for activity change and fever.  HENT: Negative for sinus pressure, sinus pain and sore throat.   Respiratory: Positive for shortness of breath and wheezing. Negative for cough.   Cardiovascular: Negative for chest pain, palpitations and leg swelling.  Gastrointestinal: Negative for diarrhea, nausea and vomiting.  Musculoskeletal: Negative for arthralgias.  Neurological: Negative for dizziness.  Psychiatric/Behavioral: Negative for sleep disturbance. The patient is not nervous/anxious.      Physical Exam  BP 122/78 (BP Location:  Left Arm, Cuff  Size: Normal)   Pulse 66   Temp 97.6 F (36.4 C) (Oral)   Ht 5\' 6"  (1.676 m)   Wt 185 lb (83.9 kg)   SpO2 95%   BMI 29.86 kg/m   Wt Readings from Last 5 Encounters:  08/25/19 185 lb (83.9 kg)  08/15/19 184 lb 15.5 oz (83.9 kg)  08/01/19 183 lb 6.8 oz (83.2 kg)  07/17/19 185 lb 3 oz (84 kg)  07/06/19 186 lb 12.8 oz (84.7 kg)    BMI Readings from Last 5 Encounters:  08/25/19 29.86 kg/m  08/15/19 30.31 kg/m  08/01/19 30.06 kg/m  07/17/19 30.35 kg/m  07/06/19 29.70 kg/m     Physical Exam Vitals and nursing note reviewed.  Constitutional:      General: She is not in acute distress.    Appearance: Normal appearance. She is normal weight.  HENT:     Head: Normocephalic and atraumatic.     Right Ear: External ear normal.     Left Ear: External ear normal.     Nose: Nose normal. No congestion.     Mouth/Throat:     Mouth: Mucous membranes are moist.     Pharynx: Oropharynx is clear.  Eyes:     Pupils: Pupils are equal, round, and reactive to light.  Cardiovascular:     Rate and Rhythm: Normal rate and regular rhythm.     Pulses: Normal pulses.     Heart sounds: Normal heart sounds. No murmur heard.   Pulmonary:     Effort: Pulmonary effort is normal. No respiratory distress.     Breath sounds: Normal breath sounds. No decreased air movement. No decreased breath sounds, wheezing or rales.  Musculoskeletal:     Cervical back: Normal range of motion.     Right lower leg: No edema.     Left lower leg: No edema.  Skin:    General: Skin is warm and dry.     Capillary Refill: Capillary refill takes less than 2 seconds.  Neurological:     General: No focal deficit present.     Mental Status: She is alert and oriented to person, place, and time. Mental status is at baseline.     Gait: Gait normal.  Psychiatric:        Mood and Affect: Mood normal.        Behavior: Behavior normal.        Thought Content: Thought content normal.        Judgment:  Judgment normal.       Assessment & Plan:   COPD (chronic obstructive pulmonary disease) ([place]) Plan: Continue Breztri  [facility] to help with medication cost Samples provided today Continue pulmonary rehab Chest x-ray today Lab work today   SOB (shortness of breath) Suspect patient's shortness of breath is multifactorial given the fact that she has severe COPD.  Preserved ejection fraction congestive heart failure based off of 2019 echocardiogram.  She is not on diuretics.  Her lung sounds are clear to auscultation today.  Given her increased rescue inhaler use as well as her self-reported wheezing we can do a short course of steroids while we further evaluate other causes of her dyspnea.  Plan: Lab work today Walk today in office Chest x-ray today Echocardiogram Based off echocardiogram we will proceed forward with sending patient back to cardiology   Medication management Plan: [facility] for medication cost Breztri samples provided today     Return in about  3 months (around 11/25/2019), or if symptoms worsen or fail to improve, for Follow up with Dr. Valeta Harms.   Lauraine Rinne, NP 08/25/2019   This appointment required 34 minutes of patient care (this includes precharting, chart review, review of results, face-to-face care, etc.).

## 2019-08-25 NOTE — Addendum Note (Signed)
Addended by: Satira Sark D on: 08/25/2019 10:12 AM   Modules accepted: Orders

## 2019-08-25 NOTE — Assessment & Plan Note (Signed)
Plan: Continue Breztri  [facility] to help with medication cost Samples provided today Continue pulmonary rehab Chest x-ray today Lab work today

## 2019-08-26 ENCOUNTER — Telehealth: Payer: Self-pay | Admitting: Family Medicine

## 2019-08-26 ENCOUNTER — Other Ambulatory Visit: Payer: Self-pay | Admitting: Family Medicine

## 2019-08-26 DIAGNOSIS — I10 Essential (primary) hypertension: Secondary | ICD-10-CM

## 2019-08-26 DIAGNOSIS — E039 Hypothyroidism, unspecified: Secondary | ICD-10-CM

## 2019-08-26 DIAGNOSIS — M8580 Other specified disorders of bone density and structure, unspecified site: Secondary | ICD-10-CM

## 2019-08-26 NOTE — Telephone Encounter (Signed)
Please check with patient and verify current levothyroxine mcg dose, and specifically check how many pills she is taking in 1 week time. TSH is low so she needs less replacement and we likely need to alter her dose.  If we do alter her dose she will need follow-up lab check in about 2 months. I put in the follow-up order in the meantime. Thanks.

## 2019-08-28 ENCOUNTER — Telehealth: Payer: Self-pay | Admitting: Family Medicine

## 2019-08-28 MED ORDER — LEVOTHYROXINE SODIUM 125 MCG PO TABS: ORAL_TABLET | ORAL | Status: DC

## 2019-08-28 NOTE — Telephone Encounter (Signed)
Would use her current tabs but change to 1 pill a day except for 1.5 tabs on Sundays, 7.5 tabs per week.  Would recheck TSh in about 2 months. Thanks.

## 2019-08-28 NOTE — Addendum Note (Signed)
Addended by: [name] on: 08/28/2019 01:50 PM   Modules accepted: Orders

## 2019-08-28 NOTE — Telephone Encounter (Signed)
Patient is currently taking Levothyroxine 125 mcg every day and 2 tablets on Sundays for a total of 8 tablets per week.

## 2019-08-28 NOTE — Progress Notes (Signed)
  Chronic Care Management   Outreach Note  08/28/2019 Name: [name] MRN: [id number] DOB: 05-17-1955  Referred by: [name], MD Reason for referral : No chief complaint on file.   An unsuccessful telephone outreach was attempted today. The patient was referred to the pharmacist for assistance with care management and care coordination.   Follow Up Plan:   Earney [place] [facility]

## 2019-08-28 NOTE — Telephone Encounter (Signed)
Patient advised.  Lab appt scheduled.  

## 2019-08-29 ENCOUNTER — Telehealth: Payer: Self-pay | Admitting: Family Medicine

## 2019-08-29 ENCOUNTER — Other Ambulatory Visit: Payer: Self-pay

## 2019-08-29 ENCOUNTER — Encounter (HOSPITAL_COMMUNITY)
Admission: RE | Admit: 2019-08-29 | Discharge: 2019-08-29 | Disposition: A | Discharge status: Home / Self Care | Payer: Medicare Other | Source: Ambulatory Visit | Attending: Pulmonary Disease | Admitting: Pulmonary Disease

## 2019-08-29 VITALS — Wt 185.4 lb

## 2019-08-29 DIAGNOSIS — J45909 Unspecified asthma, uncomplicated: Secondary | ICD-10-CM | POA: Diagnosis not present

## 2019-08-29 NOTE — Progress Notes (Signed)
Daily Session Note  Patient Details  Name: April [name] MRN: [id number] Date of Birth: 1955/08/25 Referring Provider:     Pulmonary Rehab Walk Test from 07/17/2019 in [place]  Referring Provider [name]      Encounter Date: 08/29/2019  Check In:  Session Check In - 08/29/19 1113      Check-In   Supervising physician immediately available to respond to emergencies [facility] immediately available    Physician(s) Dr. Sherral Hammers    Location [facility]    Staff Present Maurice Small, RN, Bjorn Loser, MS, CEP, Exercise Physiologist;Lisa Jani Gravel, MS, ACSM-CEP, Exercise Physiologist    Virtual Visit No    Medication changes reported     No    Fall or balance concerns reported    No    Tobacco Cessation No Change    Warm-up and Cool-down Performed on first and last piece of equipment    Resistance Training Performed Yes    VAD Patient? No    PAD/SET Patient? No      Pain Assessment   Currently in Pain? No/denies    Pain Score 0-No pain    Multiple Pain Sites No           Capillary Blood Glucose: No results found for this or any previous visit (from the past 24 hour(s)).   Exercise Prescription Changes - 08/29/19 1200      Response to Exercise   Blood Pressure (Admit) 130/84    Blood Pressure (Exercise) 130/78    Blood Pressure (Exit) 130/70    Heart Rate (Admit) 92 bpm    Heart Rate (Exercise) 106 bpm    Heart Rate (Exit) 104 bpm    Oxygen Saturation (Admit) 96 %    Oxygen Saturation (Exercise) 95 %    Oxygen Saturation (Exit) 97 %    Rating of Perceived Exertion (Exercise) 13    Perceived Dyspnea (Exercise) 2.5    Duration Continue with 30 min of aerobic exercise without signs/symptoms of physical distress.    Intensity THRR unchanged      Progression   Progression Continue to progress workloads to maintain intensity without signs/symptoms of physical distress.     Average METs 1.9      Resistance Training   Training Prescription Yes    Weight Orange bands    Reps 10-15    Time 10 Minutes      NuStep   Level 4    Minutes 15    METs 1.9      Track   Laps 7    Minutes 15           Social History   Tobacco Use  Smoking Status Former Smoker  . Packs/day: 0.33  . Years: 34.00  . Pack years: 11.22  . Types: Cigarettes  . Quit date: 10/24/2017  . Years since quitting: 1.8  Smokeless Tobacco Never Used  Tobacco Comment   quit smoking october 2019    Goals Met:  Proper associated with RPD/PD & O2 Sat Independence with exercise equipment Exercise tolerated well No report of cardiac concerns or symptoms Strength training completed today  Goals Unmet:  Not Applicable  Comments: Service time is from 1055 to 1140    Dr. Fransico Him is Medical Director for Cardiac Rehab at [facility].

## 2019-08-29 NOTE — Patient Outreach (Signed)
Referral from Tomasa Rand, RN for Pharmacy to [email or web address] for Medication Management.

## 2019-08-29 NOTE — Telephone Encounter (Signed)
LVM for pt to rtn my call to R/S appt on 09/08/19 with NHA

## 2019-08-29 NOTE — Progress Notes (Signed)
Nutrition Note - Follow Up Spoke with pt during exercise. She kept a food log for 2 weeks. We reviewed it today.  She has started trying to follow the plate method. Per food log, she is incorporating fruits and veggies to most meals and snacks. She often chooses nuts or peanut butter to add to a snack. She avoided sugary beverages except 1 day. Pt wanted to lose weight.  Recent TSH labs 0.04.  She has been ordering groceries to be delivered instead of grocery shopping. This is saving a lot of energy. She has more energy than she did previously. She feels motivated to continue.  Will continue to monitor pt during pulmonary rehab.   Nutrition Diagnosis   ObeseI = 30-34.9related to excessive energy intake as evidenced by a BMI 30.06 kg/m2  Nutrition Intervention   Pt's individual nutrition plan reviewed with pt.  Benefits of adopting Heart Healthy diet discussed when Medficts reviewed.  Continue client-centered nutrition education by RD, as part of interdisciplinary care.  Goal(s)  Pt to identify food quantities necessary to achieve weight loss of 6-24 lb at graduation from cardiac rehab.   Pt to build a healthy plate including vegetables, fruits, whole grains, and low-fat dairy products in a heart healthy meal plan.  Pt to reduce carb intake to <240 per day (~60 per meal, 30 per snack)  Plan: Will provide client-centered nutrition education as part of interdisciplinary care  Monitor and evaluate progress toward nutrition goal with team.   Michaele Offer, MS, RDN, LDN

## 2019-08-29 NOTE — Patient Outreach (Signed)
[facility]) Care Management  08/29/2019  April [name] 05-01-1955 [id number]   New referral from MD office for assistance needed with medication cost.  Placed call to patient who answered and reports she is doing well. Reports she has been doing well until after 2nd covid  Vaccine. After vaccine patient reports that she has had worsening shortness of breath.  Reports she is going to outpatient pulmonary rehab.  Reports she is in the doughnut hole with her medications. Reports 1 inhaler is 200.00 out of pocket.  Reports she has a nebulizer and she is willing to use it, if that would help with medication cost.   Patient denies any other concerns today.   PLAN: will place referral to [place] team.  Will close to nursing. Patient remains pending awaiting pharmacy action.  Tomasa Rand, RN, BSN, CEN [facility] [employer] [phone]

## 2019-08-29 NOTE — Progress Notes (Signed)
  Chronic Care Management   Note  08/29/2019 Name: April [name] MRN: [id number] DOB: 11-Feb-1955  [name] is a 64 y.o. year old female who is a primary care patient of [name], MD. I reached out to [name] by phone today in response to a referral sent by [name]'s PCP, [name], MD.   [name] was given information about Chronic Care Management services today including:  1. CCM service includes personalized support from designated clinical staff supervised by her physician, including individualized plan of care and coordination with other care providers 2. 24/7 contact phone numbers for assistance for urgent and routine care needs. 3. Service will only be billed when office clinical staff spend 20 minutes or more in a month to coordinate care. 4. Only one practitioner may furnish and bill the service in a calendar month. 5. The patient may stop CCM services at any time (effective at the end of the month) by phone call to the office staff.   Patient agreed to services and verbal consent obtained.   Follow up plan:  Earney [place] Upstream Scheduler

## 2019-08-30 ENCOUNTER — Telehealth: Payer: Self-pay | Admitting: Pulmonary Disease

## 2019-08-30 MED ORDER — BREZTRI AEROSPHERE 160-9-4.8 MCG/ACT IN AERO: 2.0000 | INHALATION_SPRAY | Freq: Two times a day (BID) | RESPIRATORY_TRACT | 5 refills | Status: DC

## 2019-08-30 NOTE — Telephone Encounter (Signed)
Spoke with pt and she wanted Prednisone RX transferred to [facility] on [address]. I spoke with pharmacy and they will call [facility] to get rx transferred. Breztri refill sent to [facility] for 3 mth supply per pt request. Nothing further needed.

## 2019-08-31 ENCOUNTER — Encounter (HOSPITAL_COMMUNITY): Payer: Medicare Other

## 2019-09-01 DIAGNOSIS — S42202A Unspecified fracture of upper end of left humerus, initial encounter for closed fracture: Secondary | ICD-10-CM | POA: Diagnosis not present

## 2019-09-01 DIAGNOSIS — M25512 Pain in left shoulder: Secondary | ICD-10-CM | POA: Diagnosis not present

## 2019-09-01 DIAGNOSIS — M25562 Pain in left knee: Secondary | ICD-10-CM | POA: Diagnosis not present

## 2019-09-04 ENCOUNTER — Other Ambulatory Visit: Payer: Self-pay | Admitting: Physician Assistant

## 2019-09-04 ENCOUNTER — Other Ambulatory Visit: Payer: Self-pay

## 2019-09-04 ENCOUNTER — Ambulatory Visit
Admission: RE | Admit: 2019-09-04 | Discharge: 2019-09-04 | Disposition: A | Discharge status: Home / Self Care | Payer: Medicare Other | Source: Ambulatory Visit | Attending: Physician Assistant | Admitting: Physician Assistant

## 2019-09-04 DIAGNOSIS — M25512 Pain in left shoulder: Secondary | ICD-10-CM

## 2019-09-04 DIAGNOSIS — S42252A Displaced fracture of greater tuberosity of left humerus, initial encounter for closed fracture: Secondary | ICD-10-CM | POA: Diagnosis not present

## 2019-09-04 DIAGNOSIS — M25412 Effusion, left shoulder: Secondary | ICD-10-CM | POA: Diagnosis not present

## 2019-09-04 DIAGNOSIS — M7989 Other specified soft tissue disorders: Secondary | ICD-10-CM | POA: Diagnosis not present

## 2019-09-04 DIAGNOSIS — I7090 Unspecified atherosclerosis: Secondary | ICD-10-CM | POA: Diagnosis not present

## 2019-09-05 ENCOUNTER — Encounter (HOSPITAL_COMMUNITY): Payer: Medicare Other

## 2019-09-05 DIAGNOSIS — S42252A Displaced fracture of greater tuberosity of left humerus, initial encounter for closed fracture: Secondary | ICD-10-CM | POA: Diagnosis not present

## 2019-09-07 ENCOUNTER — Encounter (HOSPITAL_COMMUNITY): Payer: Medicare Other

## 2019-09-08 ENCOUNTER — Ambulatory Visit: Payer: Medicare Other

## 2019-09-08 ENCOUNTER — Other Ambulatory Visit: Payer: Medicare Other

## 2019-09-12 ENCOUNTER — Ambulatory Visit: Payer: Medicare Other

## 2019-09-12 ENCOUNTER — Encounter (HOSPITAL_COMMUNITY): Payer: Medicare Other

## 2019-09-14 ENCOUNTER — Encounter (HOSPITAL_COMMUNITY): Payer: Medicare Other

## 2019-09-14 ENCOUNTER — Ambulatory Visit: Payer: Medicare Other | Admitting: Surgical

## 2019-09-15 ENCOUNTER — Encounter: Payer: Medicare Other | Admitting: Family Medicine

## 2019-09-18 ENCOUNTER — Other Ambulatory Visit: Payer: Medicare Other

## 2019-09-19 ENCOUNTER — Encounter (HOSPITAL_COMMUNITY): Payer: Medicare Other

## 2019-09-19 DIAGNOSIS — S42252A Displaced fracture of greater tuberosity of left humerus, initial encounter for closed fracture: Secondary | ICD-10-CM | POA: Diagnosis not present

## 2019-09-21 ENCOUNTER — Encounter (HOSPITAL_COMMUNITY): Payer: Medicare Other

## 2019-09-28 ENCOUNTER — Encounter: Payer: Self-pay | Admitting: Family Medicine

## 2019-09-29 ENCOUNTER — Other Ambulatory Visit: Payer: Self-pay | Admitting: Family Medicine

## 2019-10-02 DIAGNOSIS — M25512 Pain in left shoulder: Secondary | ICD-10-CM | POA: Diagnosis not present

## 2019-10-02 DIAGNOSIS — S42252A Displaced fracture of greater tuberosity of left humerus, initial encounter for closed fracture: Secondary | ICD-10-CM | POA: Diagnosis not present

## 2019-10-06 DIAGNOSIS — M25512 Pain in left shoulder: Secondary | ICD-10-CM | POA: Diagnosis not present

## 2019-10-06 DIAGNOSIS — S42252A Displaced fracture of greater tuberosity of left humerus, initial encounter for closed fracture: Secondary | ICD-10-CM | POA: Diagnosis not present

## 2019-10-09 ENCOUNTER — Telehealth: Payer: Self-pay | Admitting: Pulmonary Disease

## 2019-10-09 ENCOUNTER — Telehealth: Payer: Self-pay

## 2019-10-09 ENCOUNTER — Ambulatory Visit: Payer: Medicare Other

## 2019-10-09 NOTE — Telephone Encounter (Signed)
Tried calling the pt back and there was no answer, VM was not set up yet, will call back

## 2019-10-09 NOTE — Chronic Care Management (AMB) (Signed)
Unsuccessful outreach to patient for initial CCM phone visit with pharmacist. Unable to leave voice mail due to mailbox full.   Sherre Poot, PharmD, [facility] Clinical Pharmacist [facility] [phone] (office) [phone] (mobile)

## 2019-10-09 NOTE — Chronic Care Management (AMB) (Unsigned)
Chronic Care Management Pharmacy  Name: April [name]  MRN: [id number] DOB: [birth year]  Chief Complaint/ HPI  April [name],  64 y.o. , female presents for their Initial CCM visit with the clinical pharmacist via telephone due to COVID-19 Pandemic.  PCP : [name], MD  Their chronic conditions include: hypertension, aortic atherosclerosis, COPD, asthma, allergic rhinitis, GERD, hypothyroidism, osteoarthritis, osteopenia, anxiety, depression.   Office Visits:*** 06/01/2019: PCP - preop approval for upcoming surgery..  Consult Visit:*** 08/25/2019: Pulmonology - continue Breztri and work on affordability. 07/2019 and 08/2019: pulmonary rehab.  07/26/2019: plastic surgery - post-op visit.  07/12/2019: plastic surgery - post-op visit.  07/06/2019: Rheumatology - drug holiday from [facility]. Repeat dexa next year.  06/21/2019: plastic surgery - 1 week postop.  06/13/2019 - surgical visit for breast reduciton.  06/01/2019: plastic surgery - preop appointment Zofran and Norco prescriptions given.  05/10/2019:  plastic surgery - consultation for breast reduction.  Medications: Outpatient Encounter Medications as of 10/09/2019  Medication Sig  . acetaminophen (TYLENOL) 500 MG tablet Take 1,000 mg by mouth every 6 (six) hours as needed for moderate pain or headache.  . albuterol (PROVENTIL) (2.5 MG/3ML) 0.083% nebulizer solution Take 3 mLs (2.5 mg total) by nebulization every 6 (six) hours as needed for wheezing or shortness of breath.  Marland Kitchen albuterol (VENTOLIN HFA) 108 (90 Base) MCG/ACT inhaler Inhale 2 puffs into the lungs every 6 (six) hours as needed for wheezing or shortness of breath. Okay to dispense proair/Ventolin/albuterol.  . ALPRAZolam (XANAX) 0.5 MG tablet TAKE 1/2 TO 1 TABLET BY  MOUTH TWICE DAILY AS NEEDED FOR ANXIETY (Patient taking differently: Take 0.25-0.5 mg by mouth 2 (two) times daily as needed for anxiety. )  . Budeson-Glycopyrrol-Formoterol (BREZTRI  AEROSPHERE) 160-9-4.8 MCG/ACT AERO Inhale 2 puffs into the lungs every 12 (twelve) hours.  . Budeson-Glycopyrrol-Formoterol (BREZTRI AEROSPHERE) 160-9-4.8 MCG/ACT AERO Inhale 2 puffs into the lungs 2 (two) times daily.  Marland Kitchen buPROPion (WELLBUTRIN XL) 300 MG 24 hr tablet TAKE 1 TABLET BY MOUTH  DAILY (Patient taking differently: Take 300 mg by mouth daily. )  . Calcium 500 MG CHEW Chew 500 mg by mouth 2 (two) times daily.   . Cholecalciferol (DIALYVITE VITAMIN D 5000) 125 MCG (5000 UT) capsule Take 5,000 Units by mouth 2 (two) times daily.  . diclofenac Sodium (VOLTAREN) 1 % GEL Apply 1 application topically daily as needed (pain).  . fluticasone (FLONASE) 50 MCG/ACT nasal spray SPRAY 2 SPRAYS INTO EACH NOSTRIL EVERY DAY (Patient taking differently: Place 2 sprays into both nostrils daily as needed for allergies. )  . levothyroxine (SYNTHROID) 125 MCG tablet TAKE 1 TABLET BY MOUTH  DAILY EXCEPT TAKE 1 AND 1/2 TABLETS BY MOUTH ON SUNDAY  . meclizine (ANTIVERT) 25 MG tablet Take 0.5-1 tablets (12.5-25 mg total) by mouth 3 (three) times daily as needed for dizziness.  . metoprolol succinate (TOPROL-XL) 25 MG 24 hr tablet TAKE 1 TABLET BY MOUTH  DAILY  . Multiple Vitamin (MULTIVITAMIN) tablet Take 1 tablet by mouth daily.  Vladimir Faster Glycol-Propyl Glycol (SYSTANE OP) Place 1 drop into both eyes daily as needed (dryness).   . predniSONE (DELTASONE) 10 MG tablet Take 2 tablets (80m total) daily for the next 5 days. Take in the AM with food.  .Marland KitchenSpacer/Aero-Holding Chambers (AEROCHAMBER MV) inhaler Use as instructed  . tiZANidine (ZANAFLEX) 4 MG tablet TAKE 1/2 TO 1 TABLET BY  MOUTH EVERY 6 HOURS AS  NEEDED FOR MUSCLE SPASMS (Patient taking differently: Take 2-4  mg by mouth every 6 (six) hours as needed for muscle spasms. )  . valsartan (DIOVAN) 40 MG tablet Take 1 tablet (40 mg total) by mouth daily.  . vitamin C (ASCORBIC ACID) 250 MG tablet Take 500 mg by mouth daily.   No facility-administered encounter  medications on file as of 10/09/2019.   Allergies  Allergen Reactions  . Sulfonamide Derivatives     As a child.  Throat closed, rash.  . Alendronate Sodium     GI upset  . Dilaudid [Hydromorphone Hcl] Nausea And Vomiting   SDOH Screenings   Alcohol Screen:   . Last Alcohol Screening Score (AUDIT): Not on file  Depression (PHQ2-9): Low Risk   . PHQ-2 Score: 0  Financial Resource Strain:   . Difficulty of Paying Living Expenses: Not on file  Food Insecurity:   . Worried About Charity fundraiser in the Last Year: Not on file  . Ran Out of Food in the Last Year: Not on file  Housing:   . Last Housing Risk Score: Not on file  Physical Activity:   . Days of Exercise per Week: Not on file  . Minutes of Exercise per Session: Not on file  Social Connections:   . Frequency of Communication with Friends and Family: Not on file  . Frequency of Social Gatherings with Friends and Family: Not on file  . Attends Religious Services: Not on file  . Active Member of Clubs or Organizations: Not on file  . Attends Archivist Meetings: Not on file  . Marital Status: Not on file  Stress:   . Feeling of Stress : Not on file  Tobacco Use: Medium Risk  . Smoking Tobacco Use: Former Smoker  . Smokeless Tobacco Use: Never Used  Transportation Needs:   . Film/video editor (Medical): Not on file  . Lack of Transportation (Non-Medical): Not on file     Current Diagnosis/Assessment:  Goals Addressed   None     COPD / Asthma / Tobacco   Last spirometry score: ***  Gold Grade: {CHL HP Upstream Pharm COPD Gold RCBUL:8453646803} Current COPD Classification:  {CHL HP Upstream Pharm COPD Classification:5207029901}  Eosinophil count:   Lab Results  Component Value Date/Time   EOSPCT 2.5 08/25/2019 08:15 AM  %                               Eos (Absolute):  Lab Results  Component Value Date/Time   EOSABS 0.2 08/25/2019 08:15 AM    Tobacco Status:  Social History    Tobacco Use  Smoking Status Former Smoker  . Packs/day: 0.33  . Years: 34.00  . Pack years: 11.22  . Types: Cigarettes  . Quit date: 10/24/2017  . Years since quitting: 1.9  Smokeless Tobacco Never Used  Tobacco Comment   quit smoking october 2019    Patient has failed these meds in past: advair, Spiriva,  Patient is currently {CHL Controlled/Uncontrolled:[phone]} on the following medications: ***  Albuterol 0.083% nebulizer solution q6 hours prn wheezing/shortness of breath  Albuterol inhaler 2 puffs q6 hours prn wheezing/shortness of breath  Breztri inhaler 2 puffs q12 hours prn shortness of breath  Flonase nasal spray 2 sprays into each nostril daily Using maintenance inhaler regularly? {yes/no:20286} Frequency of rescue inhaler use:  {CHL HP Upstream Pharm Inhaler OZYY:4825003704}  We discussed:  {CHL HP Upstream Pharmacy discussion:[phone]}  Plan  Continue {CHL HP Upstream Pharmacy UGQBV:6945038882}  and  Hypertension   BP today is:  {CHL HP UPSTREAM Pharmacist BP ranges:[phone]}  Office blood pressures are  BP Readings from Last 3 Encounters:  08/25/19 122/78  07/26/19 (!) 108/57  07/17/19 122/70    Patient has failed these meds in the past: amlodipine Patient is currently controlled/uncontrolled*** on the following medications: ***  Metoprolol succinate 25 mg daily  Valsartan 40 mg daily Patient checks BP at home {CHL HP BP Monitoring Frequency:[phone]}  Patient home BP readings are ranging: ***  We discussed {CHL HP Upstream Pharmacy discussion:4451307847}  Plan  Continue {CHL HP Upstream Pharmacy Plans:[phone]}   Hyperlipidemia   LDL goal < ***  Lipid Panel     Component Value Date/Time   CHOL 198 08/23/2018 0838   TRIG 68.0 08/23/2018 0838   HDL 62.60 08/23/2018 0838   LDLCALC 122 (H) 08/23/2018 0838    Hepatic Function Latest Ref Rng & Units 08/25/2019 08/23/2018 10/27/2017  Total Protein 6.0 - 8.3 g/dL 7.3 7.5 7.3   Albumin 3.5 - 5.2 g/dL 4.2 4.4 3.1(L)  AST 0 - 37 U/L 27 16 59(H)  ALT 0 - 35 U/L 29 15 50(H)  Alk Phosphatase 39 - 117 U/L 100 95 98  Total Bilirubin 0.2 - 1.2 mg/dL 0.4 0.3 0.6     The 10-year ASCVD risk score Mikey Bussing DC Jr., et al., 2013) is: 7.2%   Values used to calculate the score:     Age: 60 years     Sex: Female     Is Non-Hispanic African American: No     Diabetic: No     Tobacco smoker: No     Systolic Blood Pressure: 809 mmHg     Is BP treated: Yes     HDL Cholesterol: 62.6 mg/dL     Total Cholesterol: 198 mg/dL   Patient has failed these meds in past: *** Patient is currently {CHL Controlled/Uncontrolled:301-326-3135} on the following medications:  . ***  We discussed:  {CHL HP Upstream Pharmacy discussion:4451307847}  Plan  Continue {CHL HP Upstream Pharmacy Plans:[phone]}  Hypothyroidism   Lab Results  Component Value Date/Time   TSH 0.04 (L) 08/25/2019 08:15 AM   TSH 0.51 10/26/2018 10:03 AM   FREET4 1.01 06/28/2017 04:38 PM   FREET4 1.41 03/30/2011 08:44 AM    Patient has failed these meds in past: *** Patient is currently {CHL Controlled/Uncontrolled:301-326-3135} on the following medications:  . Levothyroxine 125 mcg daily and 1.5 tablets on Sunday***  We discussed:  {CHL HP Upstream Pharmacy discussion:4451307847}  Plan  Continue {CHL HP Upstream Pharmacy Plans:[phone]}   Osteoarthritis/Degenerative Disc Disease   Patient has failed these meds in past: *** Patient is currently {CHL Controlled/Uncontrolled:301-326-3135} on the following medications:  . aceteaminophen 1000 mg q6h prn . Tizanidine 4 mg 1/2-1 tablet q6h prn muscle spasms . Voltaren gel 1% topically daily prn   We discussed:  ***  Plan  Continue {CHL HP Upstream Pharmacy XIPJA:2505397673}   Anxiety/Depression   Patient has failed these meds in past: *** Patient is currently {CHL Controlled/Uncontrolled:301-326-3135} on the following medications:  . Alprazolam 0.5 mg -  1/2-1 tablet bid prn anxiety . Bupropion XL 300 mg daily   We discussed:  ***  Plan  Continue {CHL HP Upstream Pharmacy Plans:[phone]}   Osteopenia / Osteoporosis   Last DEXA Scan: 11/14/2018  T-Score lumbar spine: -2.1  T-Score forearm radius: -1.2    VITD  Date Value Ref Range Status  08/23/2018 72.36 30.00 - 100.00 ng/mL Final  Patient is not a candidate for pharmacologic treatment  Patient has failed these meds in past: Boniva Patient is currently controlled on the following medications:  . Calcium 500 mg bid  . Vitamin D 5000 units bid   We discussed:  Recommend [phone] units of vitamin D daily. Recommend 1200 mg of calcium daily from dietary and supplemental sources. Recommend weight-bearing and muscle strengthening exercises for building and maintaining bone density.  Plan  Continue current medications   Health Maintenance   Patient is currently {CHL Controlled/Uncontrolled:[phone]} on the following medications:  . ***meclizine 25 mg 1/2-1 tablet tid prn dizziness . Multiple vitamin daily . Vitamin C 250 mg daily . systane eye drops 1 drop both eyes daily prn dryness  We discussed:  ***  Plan  Continue {CHL HP Upstream Pharmacy IXBOE:7841282081}   Vaccines   Reviewed and discussed patient's vaccination history.    Immunization History  Administered Date(s) Administered  . Fluad Quad(high Dose 65+) 08/26/2018  . Influenza Split 10/15/2010  . Influenza,inj,Quad PF,6+ Mos 10/05/2012, 10/24/2013, 10/12/2014, 11/12/2015, 10/16/2016, 10/14/2017  . Influenza-Unspecified 08/26/2018  . PFIZER SARS-COV-2 Vaccination 03/05/2019, 04/04/2019  . Pneumococcal Conjugate-13 10/26/2018  . Pneumococcal Polysaccharide-23 01/05/2009  . Tdap 04/06/2011    Plan  Recommended patient receive *** vaccine in *** office.   Medication Management   [facility] medications Uses pill box? {Yes or If no, why not?:20788} Pt  endorses ***% compliance  We discussed: {Pharmacy options:24294}  Plan  {US Pharmacy NGIT:19597}    Follow up: *** month phone visit  ***

## 2019-10-10 DIAGNOSIS — S42252D Displaced fracture of greater tuberosity of left humerus, subsequent encounter for fracture with routine healing: Secondary | ICD-10-CM | POA: Diagnosis not present

## 2019-10-11 DIAGNOSIS — S42252A Displaced fracture of greater tuberosity of left humerus, initial encounter for closed fracture: Secondary | ICD-10-CM | POA: Diagnosis not present

## 2019-10-11 DIAGNOSIS — M25512 Pain in left shoulder: Secondary | ICD-10-CM | POA: Diagnosis not present

## 2019-10-12 NOTE — Telephone Encounter (Signed)
Called and spoke to pt. Pt states she called her insurance and was advised the authorization for her echo, scheduled for 11/10, is only valid until 10/15.  Will forward to PCC's.

## 2019-10-13 ENCOUNTER — Ambulatory Visit: Payer: Medicare Other

## 2019-10-13 ENCOUNTER — Other Ambulatory Visit: Payer: Self-pay

## 2019-10-13 DIAGNOSIS — M25512 Pain in left shoulder: Secondary | ICD-10-CM | POA: Diagnosis not present

## 2019-10-13 DIAGNOSIS — J449 Chronic obstructive pulmonary disease, unspecified: Secondary | ICD-10-CM

## 2019-10-13 DIAGNOSIS — I10 Essential (primary) hypertension: Secondary | ICD-10-CM

## 2019-10-13 DIAGNOSIS — S42252A Displaced fracture of greater tuberosity of left humerus, initial encounter for closed fracture: Secondary | ICD-10-CM | POA: Diagnosis not present

## 2019-10-13 DIAGNOSIS — E039 Hypothyroidism, unspecified: Secondary | ICD-10-CM

## 2019-10-13 NOTE — Patient Instructions (Signed)
Visit Information  Thank you for your time discussing your medications. I look forward to working with you to achieve your health care goals. Below is a summary of what we talked about during our visit.   Goals Addressed              This Visit's Progress   .  Pharmacy Care Plan (pt-stated)        CARE PLAN ENTRY (see longitudinal plan of care for additional care plan information)  Current Barriers:  . Chronic Disease Management support, education, and care coordination needs related to Hypertension, COPD, and Hypothyroidism   Hypertension BP Readings from Last 3 Encounters:  08/25/19 122/78  07/26/19 (!) 108/57  07/17/19 122/70   . Pharmacist Clinical Goal(s): o Over the next 90 days, patient will work with PharmD and providers to maintain BP goal <130/80 . Current regimen:  o Valsartan 40 mg daily o Metoprolol succinate 25 mg daily  . Interventions: o Discussed benefits of 150 minutes of moderate exercise each week. Recommended beginning at 5-10 minutes of walking and work to increase slowly each week as tolerated.  o Reviewed medications and adherence.  o Discussed home blood pressure monitoring.  . Patient self care activities - Over the next 90 days, patient will: o Check BP if symptomatic, document, and provide at future appointments o Ensure daily salt intake < 2300 mg/day  Hypothyroid . Pharmacist Clinical Goal(s) o Over the next 90 days, patient will work with PharmD and providers to improve thyroid levels/symptoms . Current regimen:  o Levothyroxine 125 mcg daily and 0.5 tablets on Sunday . Interventions: o Discussed thyroid symptoms and need for updated labs.  o Reviewed medication dosing and administration.  . Patient self care activities - Over the next 90 days, patient will: o Have thyroid labs drawn at scheduled appointment in November.   COPD . Pharmacist Clinical Goal(s) o Over the next 90 days, patient will work with PharmD and providers to prevent  exacerbations  . Current regimen:  o Breztri 2 puffs twice daily  o Albuterol nebulizer and inhaler as needed for wheezing/shortness of breath . Interventions: o Coordinating patient assistance application process for [employer]. Completed application mailed to patient for income, signature and provider signature. . Patient self care activities - Over the next 90 days, patient will: o Complete application and take to provider for signature.   Medication management . Pharmacist Clinical Goal(s): o Over the next 90 days, patient will work with PharmD and providers to maintain optimal medication adherence . Current pharmacy: [facility]  . Interventions o Comprehensive medication review performed. o Continue current medication management strategy . Patient self care activities - Over the next 90 days, patient will: o Focus on medication adherence by using pill box o Take medications as prescribed o Report any questions or concerns to PharmD and/or provider(s)  Initial goal documentation        Ms. Pellot was given information about Chronic Care Management services today including:  1. CCM service includes personalized support from designated clinical staff supervised by her physician, including individualized plan of care and coordination with other care providers 2. 24/7 contact phone numbers for assistance for urgent and routine care needs. 3. Standard insurance, coinsurance, copays and deductibles apply for chronic care management only during months in which we provide at least 20 minutes of these services. Most insurances cover these services at 100%, however patients may be responsible for any copay, coinsurance and/or deductible if applicable. This  service may help you avoid the need for more expensive face-to-face services. 4. Only one practitioner may furnish and bill the service in a calendar month. 5. The patient may stop CCM services at any time (effective at the  end of the month) by phone call to the office staff.  Patient agreed to services and verbal consent obtained.   Patient verbalizes understanding of instructions provided today.  Telephone follow up appointment with pharmacy team member scheduled for: 04/2020  Sherre Poot, PharmD Clinical Pharmacist [place] [phone] (office) [phone] (mobile)

## 2019-10-13 NOTE — Chronic Care Management (AMB) (Signed)
Chronic Care Management Pharmacy  Name: April [name]  MRN: [id number] DOB: [birth year]  Chief Complaint/ HPI  April [name],  64 y.o. , female presents for their Initial CCM visit with the clinical pharmacist via telephone due to COVID-19 Pandemic.  PCP : [name], MD  Their chronic conditions include: hypertension, aortic atherosclerosis, COPD, asthma, allergic rhinitis, GERD, hypothyroidism, osteoarthritis, osteopenia, anxiety, depression.   Office Visits: 06/01/2019: PCP - preop approval for upcoming surgery..  Consult Visit: 08/25/2019: Pulmonology - continue Breztri and work on affordability. 07/2019 and 08/2019: pulmonary rehab.  07/26/2019: plastic surgery - post-op visit.  07/12/2019: plastic surgery - post-op visit.  07/06/2019: Rheumatology - drug holiday from [place]. Repeat dexa next year.  06/21/2019: plastic surgery - 1 week postop.  06/13/2019 - surgical visit for breast reduciton.  06/01/2019: plastic surgery - preop appointment Zofran and Norco prescriptions given.  05/10/2019:  plastic surgery - consultation for breast reduction.  Medications: Outpatient Encounter Medications as of 10/13/2019  Medication Sig  . acetaminophen (TYLENOL) 500 MG tablet Take 1,000 mg by mouth every 6 (six) hours as needed for moderate pain or headache.  . albuterol (PROVENTIL) (2.5 MG/3ML) 0.083% nebulizer solution Take 3 mLs (2.5 mg total) by nebulization every 6 (six) hours as needed for wheezing or shortness of breath.  Marland Kitchen albuterol (VENTOLIN HFA) 108 (90 Base) MCG/ACT inhaler Inhale 2 puffs into the lungs every 6 (six) hours as needed for wheezing or shortness of breath. Okay to dispense proair/Ventolin/albuterol.  . ALPRAZolam (XANAX) 0.5 MG tablet TAKE 1/2 TO 1 TABLET BY  MOUTH TWICE DAILY AS NEEDED FOR ANXIETY (Patient taking differently: Take 0.25-0.5 mg by mouth 2 (two) times daily as needed for anxiety. )  . Budeson-Glycopyrrol-Formoterol (BREZTRI  AEROSPHERE) 160-9-4.8 MCG/ACT AERO Inhale 2 puffs into the lungs every 12 (twelve) hours.  Marland Kitchen buPROPion (WELLBUTRIN XL) 300 MG 24 hr tablet TAKE 1 TABLET BY MOUTH  DAILY (Patient taking differently: Take 300 mg by mouth daily. )  . Calcium 500 MG CHEW Chew 500 mg by mouth 2 (two) times daily.   . Cholecalciferol (DIALYVITE VITAMIN D 5000) 125 MCG (5000 UT) capsule Take 5,000 Units by mouth 2 (two) times daily.  . cyclobenzaprine (FLEXERIL) 5 MG tablet Take 5 mg by mouth 3 (three) times daily as needed for muscle spasms.  . fluticasone (FLONASE) 50 MCG/ACT nasal spray SPRAY 2 SPRAYS INTO EACH NOSTRIL EVERY DAY (Patient taking differently: Place 2 sprays into both nostrils daily as needed for allergies. )  . Homeopathic Products [facility] RELIEF EX) Apply topically. Applying prn to bicep muscle since shoulder break.  . levothyroxine (SYNTHROID) 125 MCG tablet TAKE 1 TABLET BY MOUTH  DAILY EXCEPT TAKE 1 AND 1/2 TABLETS BY MOUTH ON SUNDAY (Patient taking differently: TAKE 1 TABLET BY MOUTH  DAILY EXCEPT TAKE 1/2 TABLETS BY MOUTH ON SUNDAY)  . meclizine (ANTIVERT) 25 MG tablet Take 0.5-1 tablets (12.5-25 mg total) by mouth 3 (three) times daily as needed for dizziness.  . metoprolol succinate (TOPROL-XL) 25 MG 24 hr tablet TAKE 1 TABLET BY MOUTH  DAILY  . Multiple Vitamin (MULTIVITAMIN) tablet Take 1 tablet by mouth daily.  Vladimir Faster Glycol-Propyl Glycol (SYSTANE OP) Place 1 drop into both eyes daily as needed (dryness).   Marland Kitchen Spacer/Aero-Holding Chambers (AEROCHAMBER MV) inhaler Use as instructed  . tiZANidine (ZANAFLEX) 4 MG tablet TAKE 1/2 TO 1 TABLET BY  MOUTH EVERY 6 HOURS AS  NEEDED FOR MUSCLE SPASMS (Patient taking differently: Take 2-4 mg by  mouth every 6 (six) hours as needed for muscle spasms. )  . valsartan (DIOVAN) 40 MG tablet Take 1 tablet (40 mg total) by mouth daily.  . vitamin C (ASCORBIC ACID) 250 MG tablet Take 500 mg by mouth daily.  . Budeson-Glycopyrrol-Formoterol (BREZTRI  AEROSPHERE) 160-9-4.8 MCG/ACT AERO Inhale 2 puffs into the lungs 2 (two) times daily. (Patient not taking: Reported on 10/13/2019)  . diclofenac Sodium (VOLTAREN) 1 % GEL Apply 1 application topically daily as needed (pain). (Patient not taking: Reported on 10/13/2019)  . predniSONE (DELTASONE) 10 MG tablet Take 2 tablets (22m total) daily for the next 5 days. Take in the AM with food. (Patient not taking: Reported on 10/13/2019)   No facility-administered encounter medications on file as of 10/13/2019.   Allergies  Allergen Reactions  . Sulfonamide Derivatives     As a child.  Throat closed, rash.  . Alendronate Sodium     GI upset  . Dilaudid [Hydromorphone Hcl] Nausea And Vomiting   SDOH Screenings   Alcohol Screen:   . Last Alcohol Screening Score (AUDIT): Not on file  Depression (PHQ2-9): Low Risk   . PHQ-2 Score: 0  Financial Resource Strain:   . Difficulty of Paying Living Expenses: Not on file  Food Insecurity:   . Worried About RCharity fundraiserin the Last Year: Not on file  . Ran Out of Food in the Last Year: Not on file  Housing:   . Last Housing Risk Score: Not on file  Physical Activity:   . Days of Exercise per Week: Not on file  . Minutes of Exercise per Session: Not on file  Social Connections:   . Frequency of Communication with Friends and Family: Not on file  . Frequency of Social Gatherings with Friends and Family: Not on file  . Attends Religious Services: Not on file  . Active Member of Clubs or Organizations: Not on file  . Attends CArchivistMeetings: Not on file  . Marital Status: Not on file  Stress:   . Feeling of Stress : Not on file  Tobacco Use: Medium Risk  . Smoking Tobacco Use: Former Smoker  . Smokeless Tobacco Use: Never Used  Transportation Needs:   . LFilm/video editor(Medical): Not on file  . Lack of Transportation (Non-Medical): Not on file     Current Diagnosis/Assessment:  Goals Addressed              This  Visit's Progress   .  Pharmacy Care Plan (pt-stated)        CARE PLAN ENTRY (see longitudinal plan of care for additional care plan information)  Current Barriers:  . Chronic Disease Management support, education, and care coordination needs related to Hypertension, COPD, and Hypothyroidism   Hypertension BP Readings from Last 3 Encounters:  08/25/19 122/78  07/26/19 (!) 108/57  07/17/19 122/70   . Pharmacist Clinical Goal(s): o Over the next 90 days, patient will work with PharmD and providers to maintain BP goal <130/80 . Current regimen:  o Valsartan 40 mg daily o Metoprolol succinate 25 mg daily  . Interventions: o Discussed benefits of 150 minutes of moderate exercise each week. Recommended beginning at 5-10 minutes of walking and work to increase slowly each week as tolerated.  o Reviewed medications and adherence.  o Discussed home blood pressure monitoring.  . Patient self care activities - Over the next 90 days, patient will: o Check BP if symptomatic, document, and provide at future appointments  o Ensure daily salt intake < 2300 mg/day  Hypothyroid . Pharmacist Clinical Goal(s) o Over the next 90 days, patient will work with PharmD and providers to improve thyroid levels/symptoms . Current regimen:  o Levothyroxine 125 mcg daily and 0.5 tablets on Sunday . Interventions: o Discussed thyroid symptoms and need for updated labs.  o Reviewed medication dosing and administration.  . Patient self care activities - Over the next 90 days, patient will: o Have thyroid labs drawn at scheduled appointment in November.   COPD . Pharmacist Clinical Goal(s) o Over the next 90 days, patient will work with PharmD and providers to prevent exacerbations  . Current regimen:  o Breztri 2 puffs twice daily  o Albuterol nebulizer and inhaler as needed for wheezing/shortness of breath . Interventions: o Coordinating patient assistance application process for [employer]. Completed  application mailed to patient for income, signature and provider signature. . Patient self care activities - Over the next 90 days, patient will: o Complete application and take to provider for signature.   Medication management . Pharmacist Clinical Goal(s): o Over the next 90 days, patient will work with PharmD and providers to maintain optimal medication adherence . Current pharmacy: [facility]  . Interventions o Comprehensive medication review performed. o Continue current medication management strategy . Patient self care activities - Over the next 90 days, patient will: o Focus on medication adherence by using pill box o Take medications as prescribed o Report any questions or concerns to PharmD and/or provider(s)  Initial goal documentation        COPD / Asthma / Tobacco   Eosinophil count:   Lab Results  Component Value Date/Time   EOSPCT 2.5 08/25/2019 08:15 AM  %                               Eos (Absolute):  Lab Results  Component Value Date/Time   EOSABS 0.2 08/25/2019 08:15 AM    Tobacco Status:  Social History   Tobacco Use  Smoking Status Former Smoker  . Packs/day: 0.33  . Years: 34.00  . Pack years: 11.22  . Types: Cigarettes  . Quit date: 10/24/2017  . Years since quitting: 1.9  Smokeless Tobacco Never Used  Tobacco Comment   quit smoking october 2019    Patient has failed these meds in past: advair, Spiriva, allegra Patient is currently controlled on the following medications:   Albuterol 0.083% nebulizer solution q6 hours prn wheezing/shortness of breath  Albuterol inhaler 2 puffs q6 hours prn wheezing/shortness of breath  Breztri inhaler 2 puffs q12 hours prn shortness of breath  Flonase nasal spray 2 sprays into each nostril daily Using maintenance inhaler regularly? Yes Frequency of rescue inhaler use:  daily  We discussed:  proper inhaler technique. Patient states that her breathing limits her exercise. Completed  pulmonary rehab but still feels that she is limited walking due to breathing.   Patient is not currently taking an antihistamine but has used on occasionally in the past during allergy season. Discussed over the counter options for daily use during allergy season to help prevent exacerbation. Uses flonase as needed for Spring allergies.   Plan  Continue current medications  and  Hypertension   BP today is:  <130/80  Office blood pressures are  BP Readings from Last 3 Encounters:  08/25/19 122/78  07/26/19 (!) 108/57  07/17/19 122/70   Vitals with BMI 08/29/2019 08/25/2019 08/15/2019  Height - 5' 6"  -  Weight 185 lbs 7 oz 185 lbs 184 lbs 15 oz  BMI 29.94 55.37 48.2  Systolic - 707 -  Diastolic - 78 -  Pulse - 66 -     Patient has failed these meds in the past: amlodipine Patient is currently controlled on the following medications:   Metoprolol succinate 25 mg daily  Valsartan 40 mg daily Patient checks BP at home infrequently  Patient home BP readings are ranging: 120/70 mmHg  We discussed diet and exercise extensively.   Plan  Continue current medications   Hyperlipidemia   LDL goal < 100  Lipid Panel     Component Value Date/Time   CHOL 198 08/23/2018 0838   TRIG 68.0 08/23/2018 0838   HDL 62.60 08/23/2018 0838   LDLCALC 122 (H) 08/23/2018 0838    Hepatic Function Latest Ref Rng & Units 08/25/2019 08/23/2018 10/27/2017  Total Protein 6.0 - 8.3 g/dL 7.3 7.5 7.3  Albumin 3.5 - 5.2 g/dL 4.2 4.4 3.1(L)  AST 0 - 37 U/L 27 16 59(H)  ALT 0 - 35 U/L 29 15 50(H)  Alk Phosphatase 39 - 117 U/L 100 95 98  Total Bilirubin 0.2 - 1.2 mg/dL 0.4 0.3 0.6     The 10-year ASCVD risk score Mikey Bussing DC Jr., et al., 2013) is: 7.2%   Values used to calculate the score:     Age: 66 years     Sex: Female     Is Non-Hispanic African American: No     Diabetic: No     Tobacco smoker: No     Systolic Blood Pressure: 867 mmHg     Is BP treated: Yes     HDL Cholesterol: 62.6  mg/dL     Total Cholesterol: 198 mg/dL   Patient has failed these meds in past: n/a Patient is currently uncontrolled on the following medications:  . Diet and lifestyle  We discussed:  diet and exercise extensively  Plan  Continue current medications  Hypothyroidism   Lab Results  Component Value Date/Time   TSH 0.04 (L) 08/25/2019 08:15 AM   TSH 0.51 10/26/2018 10:03 AM   FREET4 1.01 06/28/2017 04:38 PM   FREET4 1.41 03/30/2011 08:44 AM    Patient has failed these meds in past: none reported Patient is currently uncontrolled on the following medications:  . Levothyroxine 125 mcg daily and 0.5 tablets on Sunday  We discussed:  Patient states her thyroid is not well controlled right now. She is following up with Dr. Damita Dunnings in November to assess and recheck. Now taking 125 mcg daily and 1/2 tablet on Sunday per Dr. Damita Dunnings. Reports feeling anxious lately. She reports ankle swelling like before when her thyroid was not at goal.   Plan  Continue current medications and have labs rechecked in November to assess thyroid.    Osteoarthritis/Degenerative Disc Disease   Patient has failed these meds in past: oxycodone-acetaminophen Patient is currently controlled on the following medications:  . aceteaminophen 1000 mg q6h prn . Tizanidine 4 mg 1/2-1 tablet q6h prn muscle spasms . Cyclobenzaprine 5 prn muscle spasm . Hydrocodone-acetaminophen prn pain before therapy . Voltaren gel 1% topically daily prn   We discussed: Patient can tolerate cyclobenzaprine without feeling too fatigued. Tizanidine seems to make her too groggy. Patient is recovering for her broken shoulder. She is using hydrocodone-acetaminophen for pain before therapy. Completing therapy exercises at home twice daily and visiting PT twice a week.   Plan  Continue current  medications   Anxiety/Depression   Patient has failed these meds in past: none reported Patient is currently controlled on the following  medications:  . Alprazolam 0.5 mg - 1/2-1 tablet bid prn anxiety . Bupropion XL 300 mg daily   We discussed: Rarely uses alprazolam.  Is pleased with bupropion managing symptoms.   Plan  Continue current medications   Osteopenia / Osteoporosis   Last DEXA Scan: 11/14/2018  T-Score lumbar spine: -2.1  T-Score forearm radius: -1.2    VITD  Date Value Ref Range Status  08/23/2018 72.36 30.00 - 100.00 ng/mL Final     Patient is not a candidate for pharmacologic treatment  Patient has failed these meds in past: Boniva Patient is currently controlled on the following medications:  . Calcium 500 mg bid  . Vitamin D 5000 units bid   We discussed:  Recommend [phone] units of vitamin D daily. Recommend 1200 mg of calcium daily from dietary and supplemental sources. Recommend weight-bearing and muscle strengthening exercises for building and maintaining bone density.   Patient was on long-term prednisone at high doses for temporal arteritis. She is currently on a drug holiday from [place]. Boniva treatment had improved her bone health.   Plan  Continue current medications   Health Maintenance   Patient is currently controlled on the following medications:  Marland Kitchen Meclizine 25 mg 1/2-1 tablet tid prn dizziness . Multiple vitamin daily . Vitamin C 250 mg daily . systane eye drops 1 drop both eyes daily prn dryness . Theraworx topically prn muscle pain  We discussed:  Patient has not had mammogram this year. Had a breast reduction this year. She plans to schedule at the end of this year or early 2022 due to surgeon recommendation.  Plan  Continue current medications   Vaccines   Reviewed and discussed patient's vaccination history.    Immunization History  Administered Date(s) Administered  . Fluad Quad(high Dose 65+) 08/26/2018  . Influenza Split 10/15/2010  . Influenza,inj,Quad PF,6+ Mos 10/05/2012, 10/24/2013, 10/12/2014, 11/12/2015, 10/16/2016, 10/14/2017  .  Influenza-Unspecified 08/26/2018  . PFIZER SARS-COV-2 Vaccination 03/05/2019, 04/04/2019  . Pneumococcal Conjugate-13 10/26/2018  . Pneumococcal Polysaccharide-23 01/05/2009  . Tdap 04/06/2011    Plan  Recommended patient receive annual flu and third COVID vaccine.   Medication Management   [facility] medications Uses pill box? Yes Pt endorses 100% compliance  We discussed: Current pharmacy is preferred with insurance plan and patient is satisfied with pharmacy services  Plan  Continue current medication management strategy    Follow up: 6 month phone visit

## 2019-10-16 NOTE — Telephone Encounter (Signed)
Spoke to [place] for her Echo is valid til 11/24/19 she is aware Joellen [place]

## 2019-10-17 DIAGNOSIS — M25512 Pain in left shoulder: Secondary | ICD-10-CM | POA: Diagnosis not present

## 2019-10-17 DIAGNOSIS — S42252A Displaced fracture of greater tuberosity of left humerus, initial encounter for closed fracture: Secondary | ICD-10-CM | POA: Diagnosis not present

## 2019-10-19 ENCOUNTER — Other Ambulatory Visit: Payer: Self-pay | Admitting: Pulmonary Disease

## 2019-10-19 DIAGNOSIS — S42252A Displaced fracture of greater tuberosity of left humerus, initial encounter for closed fracture: Secondary | ICD-10-CM | POA: Diagnosis not present

## 2019-10-19 DIAGNOSIS — M25512 Pain in left shoulder: Secondary | ICD-10-CM | POA: Diagnosis not present

## 2019-10-19 MED ORDER — BREZTRI AEROSPHERE 160-9-4.8 MCG/ACT IN AERO: 2.0000 | INHALATION_SPRAY | Freq: Two times a day (BID) | RESPIRATORY_TRACT | 3 refills | Status: DC

## 2019-10-24 DIAGNOSIS — S42252A Displaced fracture of greater tuberosity of left humerus, initial encounter for closed fracture: Secondary | ICD-10-CM | POA: Diagnosis not present

## 2019-10-24 DIAGNOSIS — M25512 Pain in left shoulder: Secondary | ICD-10-CM | POA: Diagnosis not present

## 2019-10-27 DIAGNOSIS — S42252A Displaced fracture of greater tuberosity of left humerus, initial encounter for closed fracture: Secondary | ICD-10-CM | POA: Diagnosis not present

## 2019-10-27 DIAGNOSIS — M25512 Pain in left shoulder: Secondary | ICD-10-CM | POA: Diagnosis not present

## 2019-10-29 NOTE — Addendum Note (Signed)
Encounter addended by: Rowe Pavy, RN on: 10/29/2019 5:48 PM  Actions taken: Episode resolved, Flowsheet accepted, Clinical Note Signed

## 2019-10-29 NOTE — Progress Notes (Signed)
Discharge Progress Report  Patient Details  Name: April [name] MRN: [id number] Date of Birth: [birth year] Referring Provider:     Pulmonary Rehab Walk Test from 07/17/2019 in [facility]  Referring Provider [name]       Number of Visits: 11  Reason for Discharge:  Early Exit:  Personal  Smoking History:  Social History   Tobacco Use  Smoking Status Former Smoker  . Packs/day: 0.33  . Years: 34.00  . Pack years: 11.22  . Types: Cigarettes  . Quit date: 10/24/2017  . Years since quitting: 2.0  Smokeless Tobacco Never Used  Tobacco Comment   quit smoking october 2019    Diagnosis:  Uncomplicated asthma, unspecified asthma severity, unspecified whether persistent  ADL UCSD:  Pulmonary Assessment Scores    Row Name 07/17/19 1146         ADL UCSD   ADL Phase Entry     SOB Score total 46       CAT Score   CAT Score 15       mMRC Score   mMRC Score 2            Initial Exercise Prescription:  Initial Exercise Prescription - 07/17/19 1200      Date of Initial Exercise RX and Referring Provider   Date 07/17/19    Referring Provider [name]    Expected Discharge Date 09/21/19      NuStep   Level 2    Minutes 30    METs 1.8      Prescription Details   Frequency (times per week) 2    Duration Progress to 30 minutes of continuous aerobic without signs/symptoms of physical distress      Intensity   THRR 40-80% of Max Heartrate 62-123    Ratings of Perceived Exertion 11-13    Perceived Dyspnea 0-4      Progression   Progression Continue progressive overload as per policy without signs/symptoms or physical distress.      Resistance Training   Training Prescription Yes    Weight Theraband    Reps 10-15           Discharge Exercise Prescription (Final Exercise Prescription Changes):  Exercise Prescription Changes - 08/29/19 1200      Response to Exercise   Blood Pressure (Admit) 130/84     Blood Pressure (Exercise) 130/78    Blood Pressure (Exit) 130/70    Heart Rate (Admit) 92 bpm    Heart Rate (Exercise) 106 bpm    Heart Rate (Exit) 104 bpm    Oxygen Saturation (Admit) 96 %    Oxygen Saturation (Exercise) 95 %    Oxygen Saturation (Exit) 97 %    Rating of Perceived Exertion (Exercise) 13    Perceived Dyspnea (Exercise) 2.5    Duration Continue with 30 min of aerobic exercise without signs/symptoms of physical distress.    Intensity THRR unchanged      Progression   Progression Continue to progress workloads to maintain intensity without signs/symptoms of physical distress.    Average METs 1.9      Resistance Training   Training Prescription Yes    Weight Orange bands    Reps 10-15    Time 10 Minutes      NuStep   Level 4    Minutes 15    METs 1.9      Track   Laps 7    Minutes 15  Functional Capacity:  6 Minute Walk    Row Name 07/17/19 1145         6 Minute Walk   Phase Initial     Distance 1064 feet     Walk Time 6 minutes     # of Rest Breaks 1     MPH 2.05     METS 3.07     RPE 11     Perceived Dyspnea  2     VO2 Peak 10.75     Symptoms Yes (comment)     Comments SOB, took 30 second break. RPD = 2     Resting HR 88 bpm     Resting BP 122/70     Resting Oxygen Saturation  98 %     Exercise Oxygen Saturation  during 6 min walk 91 %     Max Ex. HR 122 bpm     Max Ex. BP 168/94     2 Minute Post BP 140/84       Interval HR   1 Minute HR 109     2 Minute HR 115     3 Minute HR 120     4 Minute HR 122     5 Minute HR 120     6 Minute HR 117     2 Minute Post HR 100     Interval Heart Rate? Yes       Interval Oxygen   Interval Oxygen? Yes     Baseline Oxygen Saturation % 98 %     1 Minute Oxygen Saturation % 95 %     1 Minute Liters of Oxygen 0 L     2 Minute Oxygen Saturation % 93 %     2 Minute Liters of Oxygen 0 L     3 Minute Oxygen Saturation % 92 %     3 Minute Liters of Oxygen 0 L     4 Minute Oxygen  Saturation % 93 %     4 Minute Liters of Oxygen 0 L     5 Minute Oxygen Saturation % 91 %     5 Minute Liters of Oxygen 0 L     6 Minute Oxygen Saturation % 91 %     6 Minute Liters of Oxygen 0 L     2 Minute Post Oxygen Saturation % 98 %     2 Minute Post Liters of Oxygen 0 L            Psychological, QOL, Others - Outcomes: PHQ 2/9: Depression screen [facility] 2/9 07/17/2019 08/26/2018 08/24/2017 08/17/2016  Decreased Interest 0 0 0 0  Down, Depressed, Hopeless 0 0 0 0  PHQ - 2 Score 0 0 0 0  Altered sleeping 0 - - -  Tired, decreased energy 0 - - -  Change in appetite 0 - - -  Feeling bad or failure about yourself  0 - - -  Trouble concentrating 0 - - -  Moving slowly or fidgety/restless 0 - - -  Suicidal thoughts 0 - - -  PHQ-9 Score 0 - - -  Difficult doing work/chores Not difficult at all - - -  Some recent data might be hidden    Quality of Life:   Personal Goals: Goals established at orientation with interventions provided to work toward goal.  Personal Goals and Risk Factors at Admission - 07/17/19 1156      Core Components/Risk Factors/Patient Goals on Admission   Improve  shortness of breath with ADL's Yes    Intervention Provide education, individualized exercise plan and daily activity instruction to help decrease symptoms of SOB with activities of daily living.    Expected Outcomes Short Term: Improve cardiorespiratory fitness to achieve a reduction of symptoms when performing ADLs;Long Term: Be able to perform more ADLs without symptoms or delay the onset of symptoms            Personal Goals Discharge:  Goals and Risk Factor Review    Row Name 07/17/19 1134 07/17/19 1157 08/16/19 1338 10/29/19 1741       Core Components/Risk Factors/Patient Goals Review   Personal Goals Review Improve shortness of breath with ADL's;Develop more efficient breathing techniques such as purse lipped breathing and diaphragmatic breathing and practicing self-pacing with  activity.;Increase knowledge of respiratory medications and ability to use respiratory devices properly. Increase knowledge of respiratory medications and ability to use respiratory devices properly.;Improve shortness of breath with ADL's;Develop more efficient breathing techniques such as purse lipped breathing and diaphragmatic breathing and practicing self-pacing with activity. Increase knowledge of respiratory medications and ability to use respiratory devices properly.;Improve shortness of breath with ADL's;Develop more efficient breathing techniques such as purse lipped breathing and diaphragmatic breathing and practicing self-pacing with activity. Hypertension    Review -- -- Pt continues to struggle with her breathing despite normal O2 saturations.  Pt has upcoming rescheduled appt to talk to her pulmonologist about her shortness of breath.  However pt does report that she cleaned her bathroom and her shower - this is something that she has not been able to do in the past.  Pt needs reminders for breathing techniques such as PLB.  will continue to reinforce usage of these techniques. Felita completd 11 exercise sessions did not return for post assessments.    Expected Outcomes -- -- See Admission Outcomes Unable to determine.           Exercise Goals and Review:  Exercise Goals    Row Name 07/17/19 1243 08/15/19 0840           Exercise Goals   Increase Physical Activity Yes Yes      Intervention Provide advice, education, support and counseling about physical activity/exercise needs.;Develop an individualized exercise prescription for aerobic and resistive training based on initial evaluation findings, risk stratification, comorbidities and participant's personal goals. Provide advice, education, support and counseling about physical activity/exercise needs.;Develop an individualized exercise prescription for aerobic and resistive training based on initial evaluation findings, risk  stratification, comorbidities and participant's personal goals.      Expected Outcomes Short Term: Attend rehab on a regular basis to increase amount of physical activity.;Long Term: Add in home exercise to make exercise part of routine and to increase amount of physical activity.;Long Term: Exercising regularly at least 3-5 days a week. Short Term: Attend rehab on a regular basis to increase amount of physical activity.;Long Term: Add in home exercise to make exercise part of routine and to increase amount of physical activity.;Long Term: Exercising regularly at least 3-5 days a week.      Increase Strength and Stamina Yes Yes      Intervention Provide advice, education, support and counseling about physical activity/exercise needs.;Develop an individualized exercise prescription for aerobic and resistive training based on initial evaluation findings, risk stratification, comorbidities and participant's personal goals. Provide advice, education, support and counseling about physical activity/exercise needs.;Develop an individualized exercise prescription for aerobic and resistive training based on initial evaluation findings, risk stratification, comorbidities and  participant's personal goals.      Expected Outcomes Short Term: Increase workloads from initial exercise prescription for resistance, speed, and METs.;Short Term: Perform resistance training exercises routinely during rehab and add in resistance training at home;Long Term: Improve cardiorespiratory fitness, muscular endurance and strength as measured by increased METs and functional capacity (6MWT) Short Term: Increase workloads from initial exercise prescription for resistance, speed, and METs.;Short Term: Perform resistance training exercises routinely during rehab and add in resistance training at home;Long Term: Improve cardiorespiratory fitness, muscular endurance and strength as measured by increased METs and functional capacity (6MWT)       Able to understand and use rate of perceived exertion (RPE) scale Yes Yes      Intervention Provide education and explanation on how to use RPE scale Provide education and explanation on how to use RPE scale      Expected Outcomes Short Term: Able to use RPE daily in rehab to express subjective intensity level;Long Term:  Able to use RPE to guide intensity level when exercising independently Short Term: Able to use RPE daily in rehab to express subjective intensity level;Long Term:  Able to use RPE to guide intensity level when exercising independently      Able to understand and use Dyspnea scale Yes Yes      Intervention Provide education and explanation on how to use Dyspnea scale Provide education and explanation on how to use Dyspnea scale      Expected Outcomes Short Term: Able to use Dyspnea scale daily in rehab to express subjective sense of shortness of breath during exertion;Long Term: Able to use Dyspnea scale to guide intensity level when exercising independently Short Term: Able to use Dyspnea scale daily in rehab to express subjective sense of shortness of breath during exertion;Long Term: Able to use Dyspnea scale to guide intensity level when exercising independently      Knowledge and understanding of Target Heart Rate Range (THRR) Yes Yes      Intervention Provide education and explanation of THRR including how the numbers were predicted and where they are located for reference Provide education and explanation of THRR including how the numbers were predicted and where they are located for reference      Expected Outcomes Short Term: Able to state/look up THRR;Long Term: Able to use THRR to govern intensity when exercising independently;Short Term: Able to use daily as guideline for intensity in rehab Short Term: Able to state/look up THRR;Long Term: Able to use THRR to govern intensity when exercising independently;Short Term: Able to use daily as guideline for intensity in rehab       Intervention -- Provide education and demonstration on how to check pulse in carotid and radial arteries.;Review the importance of being able to check your own pulse for safety during independent exercise      Expected Outcomes -- Short Term: Able to explain why pulse checking is important during independent exercise;Long Term: Able to check pulse independently and accurately      Understanding of Exercise Prescription Yes Yes      Intervention Provide education, explanation, and written materials on patient's individual exercise prescription Provide education, explanation, and written materials on patient's individual exercise prescription      Expected Outcomes Short Term: Able to explain program exercise prescription;Long Term: Able to explain home exercise prescription to exercise independently Short Term: Able to explain program exercise prescription;Long Term: Able to explain home exercise prescription to exercise independently  Exercise Goals Re-Evaluation:  Exercise Goals Re-Evaluation    [place] Name 08/15/19 0836             Exercise Goal Re-Evaluation   Exercise Goals Review Increase Physical Activity;Increase Strength and Stamina;Able to understand and use rate of perceived exertion (RPE) scale;Able to understand and use Dyspnea scale;Knowledge and understanding of Target Heart Rate Range (THRR);Understanding of Exercise Prescription       Comments Pt has completed 5 exercise sessions in pulmonary rehab and is tolerating exercise well. She gets short of breath while walking the track but her SpO2 does not drop to the point of being put on supplemental oxygen       Expected Outcomes Through exercise at rehab and at home the patient will decrease shortness of breath with daily activities and feel confident in carying out an exercise regimn at home              Nutrition & Weight - Outcomes:  Pre Biometrics - 07/17/19 1126      Pre Biometrics   Height 5' 5.5" (1.664 m)     Weight 84 kg    BMI (Calculated) 30.34    Grip Strength 28 kg            Nutrition:  Nutrition Therapy & Goals - 08/15/19 1436      Nutrition Therapy   Diet General Healthful/Carb modified      Personal Nutrition Goals   Nutrition Goal Pt to identify food quantities necessary to achieve weight loss of 6-24 lb at graduation from pulmonary rehab.    Personal Goal #2 Pt to build a healthy plate including vegetables, fruits, whole grains, and low-fat dairy products in a heart healthy meal plan.    Personal Goal #3 Pt to reduce carb intake to <240 per day (~60 per meal, 30 per snack)      Intervention Plan   Intervention Prescribe, educate and counsel regarding individualized specific dietary modifications aiming towards targeted core components such as weight, hypertension, lipid management, diabetes, heart failure and other comorbidities.;Nutrition handout(s) given to patient.    Expected Outcomes Short Term Goal: A plan has been developed with personal nutrition goals set during dietitian appointment.;Long Term Goal: Adherence to prescribed nutrition plan.           Nutrition Discharge:  Nutrition Assessments - 08/15/19 1438      Rate Your Plate Scores   Pre Score 55           Education Questionnaire Score:  Knowledge Questionnaire Score - 07/17/19 1154      Knowledge Questionnaire Score   Pre Score 7/18           Adalyna Godbee Wilber Oliphant RN, BSN Cardiac and Pulmonary Rehab Nurse Navigator

## 2019-10-30 ENCOUNTER — Other Ambulatory Visit: Payer: Medicare Other

## 2019-10-31 DIAGNOSIS — M25512 Pain in left shoulder: Secondary | ICD-10-CM | POA: Diagnosis not present

## 2019-10-31 DIAGNOSIS — S42252A Displaced fracture of greater tuberosity of left humerus, initial encounter for closed fracture: Secondary | ICD-10-CM | POA: Diagnosis not present

## 2019-11-02 DIAGNOSIS — M25512 Pain in left shoulder: Secondary | ICD-10-CM | POA: Diagnosis not present

## 2019-11-02 DIAGNOSIS — S42252A Displaced fracture of greater tuberosity of left humerus, initial encounter for closed fracture: Secondary | ICD-10-CM | POA: Diagnosis not present

## 2019-11-06 DIAGNOSIS — M25612 Stiffness of left shoulder, not elsewhere classified: Secondary | ICD-10-CM | POA: Diagnosis not present

## 2019-11-06 DIAGNOSIS — S42252S Displaced fracture of greater tuberosity of left humerus, sequela: Secondary | ICD-10-CM | POA: Diagnosis not present

## 2019-11-07 DIAGNOSIS — M25512 Pain in left shoulder: Secondary | ICD-10-CM | POA: Diagnosis not present

## 2019-11-07 DIAGNOSIS — S42252A Displaced fracture of greater tuberosity of left humerus, initial encounter for closed fracture: Secondary | ICD-10-CM | POA: Diagnosis not present

## 2019-11-13 ENCOUNTER — Ambulatory Visit
Admission: RE | Admit: 2019-11-13 | Discharge: 2019-11-13 | Disposition: A | Discharge status: Home / Self Care | Payer: Medicare Other | Source: Ambulatory Visit | Attending: Pulmonary Disease | Admitting: Pulmonary Disease

## 2019-11-13 ENCOUNTER — Other Ambulatory Visit: Payer: Self-pay

## 2019-11-13 DIAGNOSIS — R918 Other nonspecific abnormal finding of lung field: Secondary | ICD-10-CM | POA: Diagnosis not present

## 2019-11-13 DIAGNOSIS — I700 Atherosclerosis of aorta: Secondary | ICD-10-CM | POA: Diagnosis not present

## 2019-11-13 DIAGNOSIS — R911 Solitary pulmonary nodule: Secondary | ICD-10-CM | POA: Diagnosis not present

## 2019-11-13 DIAGNOSIS — J432 Centrilobular emphysema: Secondary | ICD-10-CM | POA: Diagnosis not present

## 2019-11-14 ENCOUNTER — Other Ambulatory Visit: Payer: Self-pay

## 2019-11-14 ENCOUNTER — Ambulatory Visit: Payer: Medicare Other | Admitting: Surgical

## 2019-11-14 ENCOUNTER — Encounter: Payer: Self-pay | Admitting: Surgical

## 2019-11-14 VITALS — BP 129/77 | HR 82 | Temp 98.0°F

## 2019-11-14 DIAGNOSIS — Z9889 Other specified postprocedural states: Secondary | ICD-10-CM

## 2019-11-14 DIAGNOSIS — S42252A Displaced fracture of greater tuberosity of left humerus, initial encounter for closed fracture: Secondary | ICD-10-CM | POA: Diagnosis not present

## 2019-11-14 DIAGNOSIS — M25512 Pain in left shoulder: Secondary | ICD-10-CM | POA: Diagnosis not present

## 2019-11-14 NOTE — Progress Notes (Signed)
Patient is a 64 year old female here for follow-up after bilateral breast reduction with Dr. Claudia Desanctis on 06/13/2019. She reports that she is overall doing well.  She reports that she missed her last appointment because she fell and broke her shoulder and had been doing therapy for that.  She did not require any surgery.  She is very pleased with how her breasts have healed.  She has noticed that they have become a little bit more ptotic after the swelling resolved.  Chaperone present on exam Well-developed well-nourished white female, no acute distress.  Normal pulmonary effort. Bilateral breast incisions intact, well-healed, bilateral breasts are soft.  I did notice a few areas of fat necrosis with palpation.  Bilateral NAC's are viable with good color.  No erythema or swelling noted.  Patient to wear normal bra at this time, avoid bra with underwire. No restrictions Discussed with patient she can schedule a normal routine mammogram at the beginning of the year. We did discuss fat necrosis is likely after breast reduction surgery and she can continue with her normal breast exams.  If she notices any masses that are larger or continue to increase in size to call our office and we can reevaluate.  I recommend she call with any questions or concerns.  There is no sign of infection, seroma, hematoma.

## 2019-11-15 ENCOUNTER — Ambulatory Visit (HOSPITAL_COMMUNITY): Payer: Medicare Other | Attending: Cardiovascular Disease

## 2019-11-15 DIAGNOSIS — R0602 Shortness of breath: Secondary | ICD-10-CM | POA: Diagnosis not present

## 2019-11-15 LAB — ECHOCARDIOGRAM COMPLETE
Area-P 1/2: 6.12 cm2
S' Lateral: 3.8 cm

## 2019-11-20 ENCOUNTER — Other Ambulatory Visit: Payer: Self-pay

## 2019-11-20 ENCOUNTER — Other Ambulatory Visit (INDEPENDENT_AMBULATORY_CARE_PROVIDER_SITE_OTHER): Payer: Medicare Other

## 2019-11-20 DIAGNOSIS — I10 Essential (primary) hypertension: Secondary | ICD-10-CM

## 2019-11-20 DIAGNOSIS — E039 Hypothyroidism, unspecified: Secondary | ICD-10-CM

## 2019-11-20 DIAGNOSIS — M8580 Other specified disorders of bone density and structure, unspecified site: Secondary | ICD-10-CM | POA: Diagnosis not present

## 2019-11-20 LAB — LIPID PANEL
Cholesterol: 181 mg/dL (ref 0–200)
HDL: 60.3 mg/dL (ref >39.00)
LDL Cholesterol: 111 mg/dL — ABNORMAL HIGH (ref 0–99)
NonHDL: 120.72
Total CHOL/HDL Ratio: 3
Triglycerides: 50 mg/dL (ref 0.0–149.0)
VLDL: 10 mg/dL (ref 0.0–40.0)

## 2019-11-20 LAB — TSH: TSH: 1.08 u[IU]/mL (ref 0.35–4.50)

## 2019-11-20 LAB — VITAMIN D 25 HYDROXY (VIT D DEFICIENCY, FRACTURES): VITD: 29.76 ng/mL — ABNORMAL LOW (ref 30.00–100.00)

## 2019-11-21 DIAGNOSIS — S42252A Displaced fracture of greater tuberosity of left humerus, initial encounter for closed fracture: Secondary | ICD-10-CM | POA: Diagnosis not present

## 2019-11-21 DIAGNOSIS — M25512 Pain in left shoulder: Secondary | ICD-10-CM | POA: Diagnosis not present

## 2019-11-22 ENCOUNTER — Other Ambulatory Visit: Payer: Self-pay

## 2019-11-22 ENCOUNTER — Ambulatory Visit (INDEPENDENT_AMBULATORY_CARE_PROVIDER_SITE_OTHER): Payer: Medicare Other

## 2019-11-22 DIAGNOSIS — Z0000 Encounter for general adult medical examination without abnormal findings: Secondary | ICD-10-CM | POA: Diagnosis not present
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
## 2019-11-22 NOTE — Progress Notes (Signed)
PCP notes:  Health Maintenance: Flu- due Pneumococcal 23- due Colonoscopy- due Mammogram- due   Abnormal Screenings: none   Patient concerns: none   Nurse concerns: none   Next PCP appt.: 11/24/2019 @ 2:30 pm

## 2019-11-22 NOTE — Patient Instructions (Signed)
April [name] , Thank you for taking time to come for your Medicare Wellness Visit. I appreciate your ongoing commitment to your health goals. Please review the following plan we discussed and let me know if I can assist you in the future.   Screening recommendations/referrals: Colonoscopy: due, will discuss with provider at physical about setting this up  Mammogram: due, You said you will have this scheduled at the beginning of next year  Bone Density: Up to date, completed 11/14/2018, due 11/2020-2023 Recommended yearly ophthalmology/optometry visit for glaucoma screening and checkup Recommended yearly dental visit for hygiene and checkup  Vaccinations: Influenza vaccine: due, will get at upcoming visit  Pneumococcal vaccine: due, will get at upcoming visit  Tdap vaccine: Up to date, completed 04/06/2011, due 04/2021 Shingles vaccine: due, check with your insurance regarding coverage/cost if interested    Covid-19:Completed series  Advanced directives: Please bring a copy of your POA (Power of [place]) and/or Living Will to your next appointment.   Conditions/risks identified: hypertension  Next appointment: Follow up in one year for your annual wellness visit    Preventive Care 23 Years and Older, Female Preventive care refers to lifestyle choices and visits with your health care provider that can promote health and wellness. What does preventive care include?  A yearly physical exam. This is also called an annual well check.  Dental exams once or twice a year.  Routine eye exams. Ask your health care provider how often you should have your eyes checked.  Personal lifestyle choices, including:  Daily care of your teeth and gums.  Regular physical activity.  Eating a healthy diet.  Avoiding tobacco and drug use.  Limiting alcohol use.  Practicing safe sex.  Taking low-dose aspirin every day.  Taking vitamin and mineral supplements as recommended by your health care  provider. What happens during an annual well check? The services and screenings done by your health care provider during your annual well check will depend on your age, overall health, lifestyle risk factors, and family history of disease. Counseling  Your health care provider may ask you questions about your:  Alcohol use.  Tobacco use.  Drug use.  Emotional well-being.  Home and relationship well-being.  Sexual activity.  Eating habits.  History of falls.  Memory and ability to understand (cognition).  Work and work Statistician.  Reproductive health. Screening  You may have the following tests or measurements:  Height, weight, and BMI.  Blood pressure.  Lipid and cholesterol levels. These may be checked every 5 years, or more frequently if you are over 52 years old.  Skin check.  Lung cancer screening. You may have this screening every year starting at age 17 if you have a 30-pack-year history of smoking and currently smoke or have quit within the past 15 years.  Fecal occult blood test (FOBT) of the stool. You may have this test every year starting at age 9.  Flexible sigmoidoscopy or colonoscopy. You may have a sigmoidoscopy every 5 years or a colonoscopy every 10 years starting at age 80.  Hepatitis C blood test.  Hepatitis B blood test.  Sexually transmitted disease (STD) testing.  Diabetes screening. This is done by checking your blood sugar (glucose) after you have not eaten for a while (fasting). You may have this done every 1-3 years.  Bone density scan. This is done to screen for osteoporosis. You may have this done starting at age 46.  Mammogram. This may be done every 1-2 years. Talk to  your health care provider about how often you should have regular mammograms. Talk with your health care provider about your test results, treatment options, and if necessary, the need for more tests. Vaccines  Your health care provider may recommend certain  vaccines, such as:  Influenza vaccine. This is recommended every year.  Tetanus, diphtheria, and acellular pertussis (Tdap, Td) vaccine. You may need a Td booster every 10 years.  Zoster vaccine. You may need this after age 50.  Pneumococcal 13-valent conjugate (PCV13) vaccine. One dose is recommended after age 63.  Pneumococcal polysaccharide (PPSV23) vaccine. One dose is recommended after age 49. Talk to your health care provider about which screenings and vaccines you need and how often you need them. This information is not intended to replace advice given to you by your health care provider. Make sure you discuss any questions you have with your health care provider. Document Released: 01/18/2015 Document Revised: 09/11/2015 Document Reviewed: 10/23/2014 Elsevier Interactive Patient Education  2017 [employer] Prevention in the Home Falls can cause injuries. They can happen to people of all ages. There are many things you can do to make your home safe and to help prevent falls. What can I do on the outside of my home?  Regularly fix the edges of walkways and driveways and fix any cracks.  Remove anything that might make you trip as you walk through a door, such as a raised step or threshold.  Trim any bushes or trees on the path to your home.  Use bright outdoor lighting.  Clear any walking paths of anything that might make someone trip, such as rocks or tools.  Regularly check to see if handrails are loose or broken. Make sure that both sides of any steps have handrails.  Any raised decks and porches should have guardrails on the edges.  Have any leaves, snow, or ice cleared regularly.  Use sand or salt on walking paths during winter.  Clean up any spills in your garage right away. This includes oil or grease spills. What can I do in the bathroom?  Use night lights.  Install grab bars by the toilet and in the tub and shower. Do not use towel bars as grab  bars.  Use non-skid mats or decals in the tub or shower.  If you need to sit down in the shower, use a plastic, non-slip stool.  Keep the floor dry. Clean up any water that spills on the floor as soon as it happens.  Remove soap buildup in the tub or shower regularly.  Attach bath mats securely with double-sided non-slip rug tape.  Do not have throw rugs and other things on the floor that can make you trip. What can I do in the bedroom?  Use night lights.  Make sure that you have a light by your bed that is easy to reach.  Do not use any sheets or blankets that are too big for your bed. They should not hang down onto the floor.  Have a firm chair that has side arms. You can use this for support while you get dressed.  Do not have throw rugs and other things on the floor that can make you trip. What can I do in the kitchen?  Clean up any spills right away.  Avoid walking on wet floors.  Keep items that you use a lot in easy-to-reach places.  If you need to reach something above you, use a strong step stool that has  a grab bar.  Keep electrical cords out of the way.  Do not use floor polish or wax that makes floors slippery. If you must use wax, use non-skid floor wax.  Do not have throw rugs and other things on the floor that can make you trip. What can I do with my stairs?  Do not leave any items on the stairs.  Make sure that there are handrails on both sides of the stairs and use them. Fix handrails that are broken or loose. Make sure that handrails are as long as the stairways.  Check any carpeting to make sure that it is firmly attached to the stairs. Fix any carpet that is loose or worn.  Avoid having throw rugs at the top or bottom of the stairs. If you do have throw rugs, attach them to the floor with carpet tape.  Make sure that you have a light switch at the top of the stairs and the bottom of the stairs. If you do not have them, ask someone to add them for  you. What else can I do to help prevent falls?  Wear shoes that:  Do not have high heels.  Have rubber bottoms.  Are comfortable and fit you well.  Are closed at the toe. Do not wear sandals.  If you use a stepladder:  Make sure that it is fully opened. Do not climb a closed stepladder.  Make sure that both sides of the stepladder are locked into place.  Ask someone to hold it for you, if possible.  Clearly mark and make sure that you can see:  Any grab bars or handrails.  First and last steps.  Where the edge of each step is.  Use tools that help you move around (mobility aids) if they are needed. These include:  Canes.  Walkers.  Scooters.  Crutches.  Turn on the lights when you go into a dark area. Replace any light bulbs as soon as they burn out.  Set up your furniture so you have a clear path. Avoid moving your furniture around.  If any of your floors are uneven, fix them.  If there are any pets around you, be aware of where they are.  Review your medicines with your doctor. Some medicines can make you feel dizzy. This can increase your chance of falling. Ask your doctor what other things that you can do to help prevent falls. This information is not intended to replace advice given to you by your health care provider. Make sure you discuss any questions you have with your health care provider. Document Released: 10/18/2008 Document Revised: 05/30/2015 Document Reviewed: 01/26/2014 Elsevier Interactive Patient Education  2017 [employer].

## 2019-11-24 ENCOUNTER — Encounter: Payer: Self-pay | Admitting: Family Medicine

## 2019-11-24 ENCOUNTER — Other Ambulatory Visit: Payer: Self-pay

## 2019-11-24 ENCOUNTER — Ambulatory Visit (INDEPENDENT_AMBULATORY_CARE_PROVIDER_SITE_OTHER): Payer: Medicare Other | Admitting: Family Medicine

## 2019-11-24 VITALS — BP 128/78 | HR 60 | Temp 96.9°F | Ht 66.0 in | Wt 186.2 lb

## 2019-11-24 DIAGNOSIS — I428 Other cardiomyopathies: Secondary | ICD-10-CM

## 2019-11-24 DIAGNOSIS — E559 Vitamin D deficiency, unspecified: Secondary | ICD-10-CM | POA: Diagnosis not present

## 2019-11-24 DIAGNOSIS — F341 Dysthymic disorder: Secondary | ICD-10-CM

## 2019-11-24 DIAGNOSIS — Z23 Encounter for immunization: Secondary | ICD-10-CM | POA: Diagnosis not present

## 2019-11-24 DIAGNOSIS — I10 Essential (primary) hypertension: Secondary | ICD-10-CM

## 2019-11-24 DIAGNOSIS — Z7189 Other specified counseling: Secondary | ICD-10-CM | POA: Diagnosis not present

## 2019-11-24 DIAGNOSIS — Z1211 Encounter for screening for malignant neoplasm of colon: Secondary | ICD-10-CM

## 2019-11-24 DIAGNOSIS — J439 Emphysema, unspecified: Secondary | ICD-10-CM

## 2019-11-24 DIAGNOSIS — Z0000 Encounter for general adult medical examination without abnormal findings: Secondary | ICD-10-CM

## 2019-11-24 DIAGNOSIS — E039 Hypothyroidism, unspecified: Secondary | ICD-10-CM

## 2019-11-24 MED ORDER — TIZANIDINE HCL 4 MG PO TABS: 2.0000 mg | ORAL_TABLET | Freq: Four times a day (QID) | ORAL | 3 refills | Status: DC | PRN

## 2019-11-24 MED ORDER — ALPRAZOLAM 0.5 MG PO TABS: 0.2500 mg | ORAL_TABLET | Freq: Two times a day (BID) | ORAL | 2 refills | Status: DC | PRN

## 2019-11-24 MED ORDER — BUPROPION HCL ER (XL) 300 MG PO TB24: 300.0000 mg | ORAL_TABLET | Freq: Every day | ORAL | 3 refills | Status: DC

## 2019-11-24 MED ORDER — LEVOTHYROXINE SODIUM 125 MCG PO TABS: ORAL_TABLET | ORAL | Status: DC

## 2019-11-24 MED ORDER — VALSARTAN 40 MG PO TABS: 40.0000 mg | ORAL_TABLET | Freq: Every day | ORAL | 3 refills | Status: DC

## 2019-11-24 MED ORDER — DIALYVITE VITAMIN D 5000 125 MCG (5000 UT) PO CAPS: ORAL_CAPSULE | ORAL | Status: DC

## 2019-11-24 NOTE — Patient Instructions (Addendum)
Take an extra vitamin D on Sundays.  1 day otherwise.  8 per week.   Take care.  Glad to see you. Update me as needed.   Flu and pneumonia shot today.

## 2019-11-24 NOTE — Progress Notes (Signed)
This visit occurred during the SARS-CoV-2 public health emergency.  Safety protocols were in place, including screening questions prior to the visit, additional usage of staff PPE, and extensive cleaning of exam room while observing appropriate contact time as indicated for disinfecting solutions.  Low vit D d/w pt.  Had been taking 5000 IU a day.  Discussed with patient about increasing her dose slightly and taking an extra pill on Sundays.  She is still in PT for her shoulder.  She is still working on ROM.  She is still making progress.    Flu 2021 Shingles discussed with patient. PNA 2021 Tetanus 2013 covid 2021 Colon cancer screening done colonoscopy 2018.  Referral placed 2021 Breast cancer screening pending for 01/2020.   Bone density test 2020 Advance directive-  [name] son Gery Pray designated if patient were incapacitated.   She has the tendency to roll her L ankle.  No specific injury but just some generalized laxity.  D/w pt about trying otc ASO vs lace up brace.  We talked about ROM exercises at the ankle.    Hypertension:    Using medication without problems or lightheadedness: yes Chest pain with exertion: no Edema: no Short of breath: see anxiety, she has some SOB with activity.  She has pulmonary f/u pending.    Mood d/w pt.  She has had trouble with anxiety.  She is living alone.  Mult stressors d/w pt.   Hypothyroidism.  She had shakiness and ankle swelling on higher dose, resolved with cutting dose down to 1 tab a day.  D/w pt. TSH wnl.    COPD.  Still with some wheeze.  SABA helps.  Compliant with medications.  She is considering housing options.   Meds, vitals, and allergies reviewed.   ROS: Per HPI unless specifically indicated in ROS section   GEN: nad, alert and oriented HEENT: ncat NECK: supple w/o LA CV: rrr PULM: ctab, no inc wob ABD: soft, +bs EXT: no edema SKIN: no acute rash

## 2019-11-26 DIAGNOSIS — Z0000 Encounter for general adult medical examination without abnormal findings: Secondary | ICD-10-CM | POA: Insufficient documentation

## 2019-11-26 NOTE — Assessment & Plan Note (Signed)
She had shakiness and ankle swelling on higher dose, resolved with cutting dose down to 1 tab a day.  D/w pt. TSH wnl.  Continue levothyroxine as is.

## 2019-11-26 NOTE — Assessment & Plan Note (Signed)
Continue 5000 units a day except for taking 2 tablets on Sundays, 8 tablets/week.

## 2019-11-26 NOTE — Assessment & Plan Note (Addendum)
I will update pulmonary and ask for input since she still having wheezing and need for albuterol in spite of using breztri as directed.  No change in medications for now.  Still okay for outpatient follow-up.

## 2019-11-26 NOTE — Assessment & Plan Note (Signed)
  Flu 2021 Shingles discussed with patient. PNA 2021 Tetanus 2013 covid 2021 Colon cancer screening done colonoscopy 2018.  Referral placed 2021 Breast cancer screening pending for 01/2020.   Bone density test 2020 Advance directive-  April [name] son April [name] designated if patient were incapacitated.

## 2019-11-26 NOTE — Assessment & Plan Note (Signed)
She has had trouble with anxiety.  She is living alone.  Mult stressors d/w pt. continue Wellbutrin.  She had some relief with that medication previously.  She is used alprazolam as needed.  No adverse effect on medication previously.  It helped with anxiety.  Continue as needed use.  Update me as needed.

## 2019-11-26 NOTE — Assessment & Plan Note (Signed)
Blood pressures controlled.  Continue metoprolol and valsartan.  Labs discussed with patient.  She agrees.

## 2019-11-26 NOTE — Assessment & Plan Note (Signed)
Advance directive-  [name] son Gery Pray designated if patient were incapacitated.

## 2019-11-28 DIAGNOSIS — S42252A Displaced fracture of greater tuberosity of left humerus, initial encounter for closed fracture: Secondary | ICD-10-CM | POA: Diagnosis not present

## 2019-11-28 DIAGNOSIS — M25512 Pain in left shoulder: Secondary | ICD-10-CM | POA: Diagnosis not present

## 2019-12-06 DIAGNOSIS — M25512 Pain in left shoulder: Secondary | ICD-10-CM | POA: Diagnosis not present

## 2019-12-06 DIAGNOSIS — S42252A Displaced fracture of greater tuberosity of left humerus, initial encounter for closed fracture: Secondary | ICD-10-CM | POA: Diagnosis not present

## 2019-12-12 DIAGNOSIS — M25512 Pain in left shoulder: Secondary | ICD-10-CM | POA: Diagnosis not present

## 2019-12-12 DIAGNOSIS — S42252A Displaced fracture of greater tuberosity of left humerus, initial encounter for closed fracture: Secondary | ICD-10-CM | POA: Diagnosis not present

## 2019-12-15 ENCOUNTER — Telehealth: Payer: Self-pay

## 2019-12-15 NOTE — Chronic Care Management (AMB) (Deleted)
Chronic Care Management Pharmacy Assistant   Name: April [name]  MRN: [id number] DOB: 1955-04-27  Reason for Encounter: Medication Review     PCP : [name], MD  Allergies:   Allergies  Allergen Reactions   Sulfonamide Derivatives     As a child.  Throat closed, rash.   Alendronate Sodium     GI upset   Dilaudid [Hydromorphone Hcl] Nausea And Vomiting    Medications: Outpatient Encounter Medications as of 12/15/2019  Medication Sig   acetaminophen (TYLENOL) 500 MG tablet Take 1,000 mg by mouth every 6 (six) hours as needed for moderate pain or headache.   albuterol (VENTOLIN HFA) 108 (90 Base) MCG/ACT inhaler Inhale 2 puffs into the lungs every 6 (six) hours as needed for wheezing or shortness of breath. Okay to dispense proair/Ventolin/albuterol.   ALPRAZolam (XANAX) 0.5 MG tablet Take 0.5-1 tablets (0.25-0.5 mg total) by mouth 2 (two) times daily as needed. for anxiety   Budeson-Glycopyrrol-Formoterol (BREZTRI AEROSPHERE) 160-9-4.8 MCG/ACT AERO Inhale 2 puffs into the lungs every 12 (twelve) hours.   buPROPion (WELLBUTRIN XL) 300 MG 24 hr tablet Take 1 tablet (300 mg total) by mouth daily.   Calcium 500 MG CHEW Chew 500 mg by mouth 2 (two) times daily.    Cholecalciferol (DIALYVITE VITAMIN D 5000) 125 MCG (5000 UT) capsule 1 tab a day except for 2 on Sundays   fluticasone (FLONASE) 50 MCG/ACT nasal spray SPRAY 2 SPRAYS INTO EACH NOSTRIL EVERY DAY   Homeopathic Products (THERAWORX RELIEF EX) Apply topically. Applying prn to bicep muscle since shoulder break.   levothyroxine (SYNTHROID) 125 MCG tablet TAKE 1 TABLET BY MOUTH  DAILY   meclizine (ANTIVERT) 25 MG tablet Take 0.5-1 tablets (12.5-25 mg total) by mouth 3 (three) times daily as needed for dizziness.   metoprolol succinate (TOPROL-XL) 25 MG 24 hr tablet TAKE 1 TABLET BY MOUTH  DAILY   Multiple Vitamin (MULTIVITAMIN) tablet Take 1 tablet by mouth daily.   Polyethyl Glycol-Propyl  Glycol (SYSTANE OP) Place 1 drop into both eyes daily as needed (dryness).    Spacer/Aero-Holding Chambers (AEROCHAMBER MV) inhaler Use as instructed   tiZANidine (ZANAFLEX) 4 MG tablet Take 0.5-1 tablets (2-4 mg total) by mouth every 6 (six) hours as needed for muscle spasms.   valsartan (DIOVAN) 40 MG tablet Take 1 tablet (40 mg total) by mouth daily.   vitamin C (ASCORBIC ACID) 250 MG tablet Take 500 mg by mouth daily.   No facility-administered encounter medications on file as of 12/15/2019.    Current Diagnosis: Patient Active Problem List   Diagnosis Date Noted   Healthcare maintenance 11/26/2019   Medication management 08/25/2019   Macromastia 05/31/2019   Allergic rhinitis 04/06/2019   Pain in left ankle and joints of left foot 03/30/2019   Medicare welcome exam 08/29/2018   Aortic atherosclerosis ([place]) 12/17/2017   Pulmonary nodule 12/17/2017   Muscle spasm 12/13/2017   GERD (gastroesophageal reflux disease) 12/12/2017   CAP (community acquired pneumonia) 11/24/2017   Left-sided chest wall pain 11/24/2017   CKD (chronic kidney disease), stage III ([place]) 10/28/2017   Dizziness 10/27/2017   Headache 10/27/2017   Creatinine elevation 08/26/2017   SOB (shortness of breath) 08/26/2017   Tachycardia 06/29/2017   HTN (hypertension) 06/23/2017   Polymyalgia rheumatica ([place]) 05/22/2016   History of bilateral hip replacements 05/22/2016   DDD (degenerative disc disease), lumbar 05/22/2016   BPV (benign positional vertigo) 05/20/2016   Colon cancer screening 08/20/2015   Advance care  planning 08/14/2014   Vitamin D deficiency 08/14/2014   COPD (chronic obstructive pulmonary disease) ([place]) 06/04/2014   Avascular necrosis of bone of right hip ([place]) 10/06/2012   Routine general medical examination at a health care facility 04/07/2011   Osteopenia 06/09/2010   AVN (avascular necrosis of bone) ([place]) 05/16/2010   Asthma 03/23/2010    Osteoarthritis 03/23/2010   Hypothyroidism 03/21/2010   ANXIETY DEPRESSION 03/21/2010   Former smoker 03/21/2010   SKIN LESION 03/21/2010   TEMPORAL ARTERITIS 03/21/2010   Patient was contacted regarding PAP for Breztri. She states that she has already had this handled by an office in [place]. Patient was thankful that we reached out to her for this.   Follow-Up:  Patient Assistance Coordination

## 2019-12-15 NOTE — Chronic Care Management (AMB) (Addendum)
Chronic Care Management Pharmacy Assistant   Name: April [name]  MRN: [id number] DOB: Mar 23, 1955  Reason for Encounter: Patient Assistance Application Renewal  PCP : [name], MD  Allergies:   Allergies  Allergen Reactions   Sulfonamide Derivatives     As a child.  Throat closed, rash.   Alendronate Sodium     GI upset   Dilaudid [Hydromorphone Hcl] Nausea And Vomiting    Medications: Outpatient Encounter Medications as of 12/15/2019  Medication Sig   acetaminophen (TYLENOL) 500 MG tablet Take 1,000 mg by mouth every 6 (six) hours as needed for moderate pain or headache.   albuterol (VENTOLIN HFA) 108 (90 Base) MCG/ACT inhaler Inhale 2 puffs into the lungs every 6 (six) hours as needed for wheezing or shortness of breath. Okay to dispense proair/Ventolin/albuterol.   ALPRAZolam (XANAX) 0.5 MG tablet Take 0.5-1 tablets (0.25-0.5 mg total) by mouth 2 (two) times daily as needed. for anxiety   Budeson-Glycopyrrol-Formoterol (BREZTRI AEROSPHERE) 160-9-4.8 MCG/ACT AERO Inhale 2 puffs into the lungs every 12 (twelve) hours.   buPROPion (WELLBUTRIN XL) 300 MG 24 hr tablet Take 1 tablet (300 mg total) by mouth daily.   Calcium 500 MG CHEW Chew 500 mg by mouth 2 (two) times daily.    Cholecalciferol (DIALYVITE VITAMIN D 5000) 125 MCG (5000 UT) capsule 1 tab a day except for 2 on Sundays   fluticasone (FLONASE) 50 MCG/ACT nasal spray SPRAY 2 SPRAYS INTO EACH NOSTRIL EVERY DAY   Homeopathic Products (THERAWORX RELIEF EX) Apply topically. Applying prn to bicep muscle since shoulder break.   levothyroxine (SYNTHROID) 125 MCG tablet TAKE 1 TABLET BY MOUTH  DAILY   meclizine (ANTIVERT) 25 MG tablet Take 0.5-1 tablets (12.5-25 mg total) by mouth 3 (three) times daily as needed for dizziness.   metoprolol succinate (TOPROL-XL) 25 MG 24 hr tablet TAKE 1 TABLET BY MOUTH  DAILY   Multiple Vitamin (MULTIVITAMIN) tablet Take 1 tablet by mouth daily.   Polyethyl Glycol-Propyl Glycol  (SYSTANE OP) Place 1 drop into both eyes daily as needed (dryness).    Spacer/Aero-Holding Chambers (AEROCHAMBER MV) inhaler Use as instructed   tiZANidine (ZANAFLEX) 4 MG tablet Take 0.5-1 tablets (2-4 mg total) by mouth every 6 (six) hours as needed for muscle spasms.   valsartan (DIOVAN) 40 MG tablet Take 1 tablet (40 mg total) by mouth daily.   vitamin C (ASCORBIC ACID) 250 MG tablet Take 500 mg by mouth daily.   No facility-administered encounter medications on file as of 12/15/2019.    Current Diagnosis: Patient Active Problem List   Diagnosis Date Noted   Healthcare maintenance 11/26/2019   Medication management 08/25/2019   Macromastia 05/31/2019   Allergic rhinitis 04/06/2019   Pain in left ankle and joints of left foot 03/30/2019   Medicare welcome exam 08/29/2018   Aortic atherosclerosis ([place]) 12/17/2017   Pulmonary nodule 12/17/2017   Muscle spasm 12/13/2017   GERD (gastroesophageal reflux disease) 12/12/2017   CAP (community acquired pneumonia) 11/24/2017   Left-sided chest wall pain 11/24/2017   CKD (chronic kidney disease), stage III ([place]) 10/28/2017   Dizziness 10/27/2017   Headache 10/27/2017   Creatinine elevation 08/26/2017   SOB (shortness of breath) 08/26/2017   Tachycardia 06/29/2017   HTN (hypertension) 06/23/2017   Polymyalgia rheumatica ([place]) 05/22/2016   History of bilateral hip replacements 05/22/2016   DDD (degenerative disc disease), lumbar 05/22/2016   BPV (benign positional vertigo) 05/20/2016   Colon cancer screening 08/20/2015   Advance care planning  08/14/2014   Vitamin D deficiency 08/14/2014   COPD (chronic obstructive pulmonary disease) ([place]) 06/04/2014   Avascular necrosis of bone of right hip ([place]) 10/06/2012   Routine general medical examination at a health care facility 04/07/2011   Osteopenia 06/09/2010   AVN (avascular necrosis of bone) ([place]) 05/16/2010   Asthma 03/23/2010   Osteoarthritis 03/23/2010   Hypothyroidism 03/21/2010    ANXIETY DEPRESSION 03/21/2010   Former smoker 03/21/2010   SKIN LESION 03/21/2010   TEMPORAL ARTERITIS 03/21/2010   Patient was contacted regarding patient assistance for [facility]. She states that she has already had this taken care of from an [place] office. She is thankful for the call. She denied any medication needs.   Follow-Up:  Patient Assistance Coordination and Pharmacist Review   Debbora Dus, CPP notified  Margaretmary Dys, [place] Pharmacy Assistant [phone]

## 2019-12-15 NOTE — Progress Notes (Deleted)
Chronic Care Management Pharmacy Assistant   Name: Lavone Barrientes  MRN: [id number] DOB: Oct 23, 1955  Reason for Encounter: {Follow-Up Reason:24148}  Patient Questions:  1.  Have you seen any other providers since your last visit? {CHL THN UPSTREAM YES/NO:24149}  2.  Any changes in your medicines or health? {CHL THN UPSTREAM YES/NO:24149}   April [name],  64 y.o. , female presents for their {Initial/Follow-up:3041532} CCM visit with the clinical pharmacist {CHL HP Upstream Pharm visit DGLO:[id number]}.  PCP : [name], MD  Allergies:   Allergies  Allergen Reactions  . Sulfonamide Derivatives     As a child.  Throat closed, rash.  . Alendronate Sodium     GI upset  . Dilaudid [Hydromorphone Hcl] Nausea And Vomiting    Medications: Outpatient Encounter Medications as of 12/15/2019  Medication Sig  . acetaminophen (TYLENOL) 500 MG tablet Take 1,000 mg by mouth every 6 (six) hours as needed for moderate pain or headache.  . albuterol (VENTOLIN HFA) 108 (90 Base) MCG/ACT inhaler Inhale 2 puffs into the lungs every 6 (six) hours as needed for wheezing or shortness of breath. Okay to dispense proair/Ventolin/albuterol.  . ALPRAZolam (XANAX) 0.5 MG tablet Take 0.5-1 tablets (0.25-0.5 mg total) by mouth 2 (two) times daily as needed. for anxiety  . Budeson-Glycopyrrol-Formoterol (BREZTRI AEROSPHERE) 160-9-4.8 MCG/ACT AERO Inhale 2 puffs into the lungs every 12 (twelve) hours.  Marland Kitchen buPROPion (WELLBUTRIN XL) 300 MG 24 hr tablet Take 1 tablet (300 mg total) by mouth daily.  . Calcium 500 MG CHEW Chew 500 mg by mouth 2 (two) times daily.   . Cholecalciferol (DIALYVITE VITAMIN D 5000) 125 MCG (5000 UT) capsule 1 tab a day except for 2 on Sundays  . fluticasone (FLONASE) 50 MCG/ACT nasal spray SPRAY 2 SPRAYS INTO EACH NOSTRIL EVERY DAY  . Homeopathic Products [facility] RELIEF EX) Apply topically. Applying prn to bicep muscle since shoulder break.  . levothyroxine  (SYNTHROID) 125 MCG tablet TAKE 1 TABLET BY MOUTH  DAILY  . meclizine (ANTIVERT) 25 MG tablet Take 0.5-1 tablets (12.5-25 mg total) by mouth 3 (three) times daily as needed for dizziness.  . metoprolol succinate (TOPROL-XL) 25 MG 24 hr tablet TAKE 1 TABLET BY MOUTH  DAILY  . Multiple Vitamin (MULTIVITAMIN) tablet Take 1 tablet by mouth daily.  Vladimir Faster Glycol-Propyl Glycol (SYSTANE OP) Place 1 drop into both eyes daily as needed (dryness).   Marland Kitchen Spacer/Aero-Holding Chambers (AEROCHAMBER MV) inhaler Use as instructed  . tiZANidine (ZANAFLEX) 4 MG tablet Take 0.5-1 tablets (2-4 mg total) by mouth every 6 (six) hours as needed for muscle spasms.  . valsartan (DIOVAN) 40 MG tablet Take 1 tablet (40 mg total) by mouth daily.  . vitamin C (ASCORBIC ACID) 250 MG tablet Take 500 mg by mouth daily.   No facility-administered encounter medications on file as of 12/15/2019.    Current Diagnosis: Patient Active Problem List   Diagnosis Date Noted  . Healthcare maintenance 11/26/2019  . Medication management 08/25/2019  . Macromastia 05/31/2019  . Allergic rhinitis 04/06/2019  . Pain in left ankle and joints of left foot 03/30/2019  . Medicare welcome exam 08/29/2018  . Aortic atherosclerosis ([place]) 12/17/2017  . Pulmonary nodule 12/17/2017  . Muscle spasm 12/13/2017  . GERD (gastroesophageal reflux disease) 12/12/2017  . CAP (community acquired pneumonia) 11/24/2017  . Left-sided chest wall pain 11/24/2017  . CKD (chronic kidney disease), stage III ([place]) 10/28/2017  . Dizziness 10/27/2017  . Headache 10/27/2017  . Creatinine  elevation 08/26/2017  . SOB (shortness of breath) 08/26/2017  . Tachycardia 06/29/2017  . HTN (hypertension) 06/23/2017  . Polymyalgia rheumatica ([place]) 05/22/2016  . History of bilateral hip replacements 05/22/2016  . DDD (degenerative disc disease), lumbar 05/22/2016  . BPV (benign positional vertigo) 05/20/2016  . Colon cancer screening 08/20/2015  . Advance care  planning 08/14/2014  . Vitamin D deficiency 08/14/2014  . COPD (chronic obstructive pulmonary disease) ([place]) 06/04/2014  . Avascular necrosis of bone of right hip ([place]) 10/06/2012  . Routine general medical examination at a health care facility 04/07/2011  . Osteopenia 06/09/2010  . AVN (avascular necrosis of bone) ([place]) 05/16/2010  . Asthma 03/23/2010  . Osteoarthritis 03/23/2010  . Hypothyroidism 03/21/2010  . ANXIETY DEPRESSION 03/21/2010  . Former smoker 03/21/2010  . SKIN LESION 03/21/2010  . TEMPORAL ARTERITIS 03/21/2010    Goals Addressed   None     Follow-Up:  {Upstream CPA Follow-up:24147}

## 2019-12-18 ENCOUNTER — Encounter: Payer: Self-pay | Admitting: Pulmonary Disease

## 2019-12-18 ENCOUNTER — Ambulatory Visit: Payer: Medicare Other | Admitting: Pulmonary Disease

## 2019-12-18 ENCOUNTER — Other Ambulatory Visit: Payer: Self-pay

## 2019-12-18 VITALS — BP 122/84 | HR 90 | Temp 97.9°F | Ht 66.0 in | Wt 187.4 lb

## 2019-12-18 DIAGNOSIS — R911 Solitary pulmonary nodule: Secondary | ICD-10-CM | POA: Diagnosis not present

## 2019-12-18 DIAGNOSIS — I5020 Unspecified systolic (congestive) heart failure: Secondary | ICD-10-CM | POA: Diagnosis not present

## 2019-12-18 DIAGNOSIS — J449 Chronic obstructive pulmonary disease, unspecified: Secondary | ICD-10-CM

## 2019-12-18 DIAGNOSIS — M25512 Pain in left shoulder: Secondary | ICD-10-CM | POA: Diagnosis not present

## 2019-12-18 DIAGNOSIS — J439 Emphysema, unspecified: Secondary | ICD-10-CM | POA: Diagnosis not present

## 2019-12-18 DIAGNOSIS — R918 Other nonspecific abnormal finding of lung field: Secondary | ICD-10-CM | POA: Diagnosis not present

## 2019-12-18 DIAGNOSIS — Z79899 Other long term (current) drug therapy: Secondary | ICD-10-CM

## 2019-12-18 MED ORDER — AZITHROMYCIN 250 MG PO TABS: 250.0000 mg | ORAL_TABLET | ORAL | 0 refills | Status: DC

## 2019-12-18 MED ORDER — ALBUTEROL SULFATE HFA 108 (90 BASE) MCG/ACT IN AERS: 2.0000 | INHALATION_SPRAY | Freq: Four times a day (QID) | RESPIRATORY_TRACT | 11 refills | Status: DC | PRN

## 2019-12-18 NOTE — Patient Instructions (Signed)
Thank you for visiting Dr. Valeta Harms at [facility]. Today we recommend the following:  ECG today in the office today to check QTC  Meds ordered this encounter  Medications  . albuterol (VENTOLIN HFA) 108 (90 Base) MCG/ACT inhaler    Sig: Inhale 2 puffs into the lungs every 6 (six) hours as needed for wheezing or shortness of breath. Okay to dispense proair/Ventolin/albuterol.    Dispense:  18 g    Refill:  11    Fill insurance preferred med.  Marland Kitchen azithromycin (ZITHROMAX) 250 MG tablet    Sig: Take 1 tablet (250 mg total) by mouth every Monday, Wednesday, and Friday.    Dispense:  36 tablet    Refill:  0   Return in about 3 months (around 03/17/2020) for with APP or Dr. Valeta Harms.  Can see Dr. Valeta Harms or [name], NP in 3 months     Please do your part to reduce the spread of COVID-19.
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
## 2019-12-22 DIAGNOSIS — S42252A Displaced fracture of greater tuberosity of left humerus, initial encounter for closed fracture: Secondary | ICD-10-CM | POA: Diagnosis not present

## 2019-12-22 DIAGNOSIS — M25512 Pain in left shoulder: Secondary | ICD-10-CM | POA: Diagnosis not present

## 2019-12-26 DIAGNOSIS — M25512 Pain in left shoulder: Secondary | ICD-10-CM | POA: Diagnosis not present

## 2019-12-26 DIAGNOSIS — S42252A Displaced fracture of greater tuberosity of left humerus, initial encounter for closed fracture: Secondary | ICD-10-CM | POA: Diagnosis not present

## 2019-12-27 ENCOUNTER — Telehealth: Payer: Self-pay

## 2019-12-27 NOTE — Telephone Encounter (Signed)
Pharmacy name: [facility] Drug requested: Breztri CMM?: yes Key: [id number] Covered alternatives: Advair, Symbicort, Spiriva, Incruse Tried and failed: on file Decision: sent to plan- sending to [facility] for follow-up.

## 2020-01-02 NOTE — Telephone Encounter (Signed)
Checked status of PA via [email or web address]-   N/A on December 22 This medication or product is on your plan's list of covered drugs. Prior authorization is not required at this time. If your pharmacy has questions regarding the processing of your prescription, please have them call the [facility] pharmacy help desk at [phone]. **Please note: Formulary lowering, tiering exception, cost reduction and prospective Medicare hospice reviews cannot be requested using this method of submission. Please contact us at [phone] instead.  Will close encounter.

## 2020-01-04 ENCOUNTER — Telehealth: Payer: Self-pay

## 2020-01-04 NOTE — Chronic Care Management (AMB) (Signed)
Chronic Care Management Pharmacy Assistant   Name: April [name]  MRN: [id number] DOB: 04-Sep-1955  Reason for Encounter: Medication Review- adherence review  Reviewed chart and adherence measures. Per [employer], medication adherence for hypertension 90-99% med compliance.    PCP : [name], MD  Allergies:   Allergies  Allergen Reactions  . Sulfonamide Derivatives     As a child.  Throat closed, rash.  . Alendronate Sodium     GI upset  . Dilaudid [Hydromorphone Hcl] Nausea And Vomiting    Medications: Outpatient Encounter Medications as of 01/04/2020  Medication Sig  . acetaminophen (TYLENOL) 500 MG tablet Take 1,000 mg by mouth every 6 (six) hours as needed for moderate pain or headache.  . albuterol (VENTOLIN HFA) 108 (90 Base) MCG/ACT inhaler Inhale 2 puffs into the lungs every 6 (six) hours as needed for wheezing or shortness of breath. Okay to dispense proair/Ventolin/albuterol.  . ALPRAZolam (XANAX) 0.5 MG tablet Take 0.5-1 tablets (0.25-0.5 mg total) by mouth 2 (two) times daily as needed. for anxiety  . azithromycin (ZITHROMAX) 250 MG tablet Take 1 tablet (250 mg total) by mouth every Monday, Wednesday, and Friday.  . Budeson-Glycopyrrol-Formoterol (BREZTRI AEROSPHERE) 160-9-4.8 MCG/ACT AERO Inhale 2 puffs into the lungs every 12 (twelve) hours.  Marland Kitchen buPROPion (WELLBUTRIN XL) 300 MG 24 hr tablet Take 1 tablet (300 mg total) by mouth daily.  . Calcium 500 MG CHEW Chew 500 mg by mouth 2 (two) times daily.   . Cholecalciferol (DIALYVITE VITAMIN D 5000) 125 MCG (5000 UT) capsule 1 tab a day except for 2 on Sundays  . fluticasone (FLONASE) 50 MCG/ACT nasal spray SPRAY 2 SPRAYS INTO EACH NOSTRIL EVERY DAY  . Homeopathic Products [facility] RELIEF EX) Apply topically. Applying prn to bicep muscle since shoulder break.  . levothyroxine (SYNTHROID) 125 MCG tablet TAKE 1 TABLET BY MOUTH  DAILY  . meclizine (ANTIVERT) 25 MG tablet Take 0.5-1 tablets (12.5-25  mg total) by mouth 3 (three) times daily as needed for dizziness.  . metoprolol succinate (TOPROL-XL) 25 MG 24 hr tablet TAKE 1 TABLET BY MOUTH  DAILY  . Multiple Vitamin (MULTIVITAMIN) tablet Take 1 tablet by mouth daily.  Vladimir Faster Glycol-Propyl Glycol (SYSTANE OP) Place 1 drop into both eyes daily as needed (dryness).   Marland Kitchen Spacer/Aero-Holding Chambers (AEROCHAMBER MV) inhaler Use as instructed  . tiZANidine (ZANAFLEX) 4 MG tablet Take 0.5-1 tablets (2-4 mg total) by mouth every 6 (six) hours as needed for muscle spasms.  . valsartan (DIOVAN) 40 MG tablet Take 1 tablet (40 mg total) by mouth daily.  . vitamin C (ASCORBIC ACID) 250 MG tablet Take 500 mg by mouth daily.   No facility-administered encounter medications on file as of 01/04/2020.    Current Diagnosis: Patient Active Problem List   Diagnosis Date Noted  . Healthcare maintenance 11/26/2019  . Medication management 08/25/2019  . Macromastia 05/31/2019  . Allergic rhinitis 04/06/2019  . Pain in left ankle and joints of left foot 03/30/2019  . Medicare welcome exam 08/29/2018  . Aortic atherosclerosis ([place]) 12/17/2017  . Pulmonary nodule 12/17/2017  . Muscle spasm 12/13/2017  . GERD (gastroesophageal reflux disease) 12/12/2017  . CAP (community acquired pneumonia) 11/24/2017  . Left-sided chest wall pain 11/24/2017  . CKD (chronic kidney disease), stage III ([place]) 10/28/2017  . Dizziness 10/27/2017  . Headache 10/27/2017  . Creatinine elevation 08/26/2017  . SOB (shortness of breath) 08/26/2017  . Tachycardia 06/29/2017  . HTN (hypertension) 06/23/2017  .  Polymyalgia rheumatica (HCC) 05/22/2016  . History of bilateral hip replacements 05/22/2016  . DDD (degenerative disc disease), lumbar 05/22/2016  . BPV (benign positional vertigo) 05/20/2016  . Colon cancer screening 08/20/2015  . Advance care planning 08/14/2014  . Vitamin D deficiency 08/14/2014  . COPD (chronic obstructive pulmonary disease) (HCC) 06/04/2014  .  Avascular necrosis of bone of right hip (HCC) 10/06/2012  . Routine general medical examination at a health care facility 04/07/2011  . Osteopenia 06/09/2010  . AVN (avascular necrosis of bone) (HCC) 05/16/2010  . Asthma 03/23/2010  . Osteoarthritis 03/23/2010  . Hypothyroidism 03/21/2010  . ANXIETY DEPRESSION 03/21/2010  . Former smoker 03/21/2010  . SKIN LESION 03/21/2010  . TEMPORAL ARTERITIS 03/21/2010    Follow-Up:  Pharmacist Review   Phil Dopp, CPP notified  Jomarie Longs, [facility] Clinical Pharmacy Assistant [phone]

## 2020-01-12 DIAGNOSIS — S42252A Displaced fracture of greater tuberosity of left humerus, initial encounter for closed fracture: Secondary | ICD-10-CM | POA: Diagnosis not present

## 2020-01-12 DIAGNOSIS — M25512 Pain in left shoulder: Secondary | ICD-10-CM | POA: Diagnosis not present

## 2020-01-16 ENCOUNTER — Other Ambulatory Visit: Payer: Medicare Other

## 2020-01-16 DIAGNOSIS — Z20822 Contact with and (suspected) exposure to covid-19: Secondary | ICD-10-CM | POA: Diagnosis not present

## 2020-01-18 ENCOUNTER — Ambulatory Visit: Payer: Medicare Other | Admitting: Cardiology

## 2020-01-18 LAB — NOVEL CORONAVIRUS, NAA: SARS-CoV-2, NAA: NOT DETECTED

## 2020-01-18 LAB — SARS-COV-2, NAA 2 DAY TAT

## 2020-02-02 DIAGNOSIS — S42252A Displaced fracture of greater tuberosity of left humerus, initial encounter for closed fracture: Secondary | ICD-10-CM | POA: Diagnosis not present

## 2020-02-02 DIAGNOSIS — M25512 Pain in left shoulder: Secondary | ICD-10-CM | POA: Diagnosis not present

## 2020-02-07 ENCOUNTER — Encounter: Payer: Self-pay | Admitting: Cardiology

## 2020-02-07 ENCOUNTER — Ambulatory Visit: Payer: Medicare Other | Admitting: Cardiology

## 2020-02-07 ENCOUNTER — Other Ambulatory Visit: Payer: Self-pay

## 2020-02-07 VITALS — BP 103/56 | HR 96 | Ht 66.0 in | Wt 194.8 lb

## 2020-02-07 DIAGNOSIS — Z7189 Other specified counseling: Secondary | ICD-10-CM

## 2020-02-07 DIAGNOSIS — I428 Other cardiomyopathies: Secondary | ICD-10-CM

## 2020-02-07 DIAGNOSIS — I700 Atherosclerosis of aorta: Secondary | ICD-10-CM | POA: Diagnosis not present

## 2020-02-07 DIAGNOSIS — S42252A Displaced fracture of greater tuberosity of left humerus, initial encounter for closed fracture: Secondary | ICD-10-CM | POA: Diagnosis not present

## 2020-02-07 DIAGNOSIS — Z8249 Family history of ischemic heart disease and other diseases of the circulatory system: Secondary | ICD-10-CM | POA: Diagnosis not present

## 2020-02-07 DIAGNOSIS — M25512 Pain in left shoulder: Secondary | ICD-10-CM | POA: Diagnosis not present

## 2020-02-07 NOTE — Patient Instructions (Signed)

## 2020-02-07 NOTE — Progress Notes (Signed)
Cardiology Office Note:    Date:  02/07/2020   ID:  April [name], DOB 1955-12-31, MRN [id number]  PCP:  [name], MD  Cardiologist:  Buford Dresser, MD (prior Dr. Rockey Situ 2019)  Referring MD: [name], MD   CC: follow up  History of Present Illness:    April [name] is a 65 y.o. female with a hx of COPD gold stage 3, pulmonary nodules, PMR, temporal arteritis who is seen for follow up today. I initially met her 01/2019 as a new consult at the request of Damita Dunnings Elveria Rising, MD for the evaluation and management of abnormal echo, need to establish care with cardiology.  Cardiovascular risk factors: Chronic inflammatory conditions: PMR/temporal arteritis Tobacco use history: quit >1 year Family history: mother had bypass surgery at age 22 (MI prior), kidney disease, colon cancer. Deceased aged 79. Father died of an accident at age 25, had COPD prior. MGF with stroke, died age 20. No other CVD that she knows of, possibly one other aunt/uncle with a stroke. Prior cardiac testing and/or incidental findings on other testing (ie coronary calcium): aortic calcifications seen on noncontrast ct. Cardiac cath 2006 by Dr. Gwenlyn Found, no CAD. Stress test 09/08/2017 without ischemia. Echo 11/20 with reduced EF.  Today: [place] and broke her shoulder in August 2021. Mechanical fall. Has had no syncope but occasionally feels "swimmy headed". Notes especially first thing in the morning, has to change position gradually. Doesn't check BP at home since it has been well controlled. Tolerating valsartan.  We discussed Covid. She is staying safe.  Denies chest pain. No change in shortness of breath at rest or with normal exertion due to her COPD. No PND, orthopnea, LE edema or unexpected weight gain. No syncope or palpitations. No recent issues at all with her heart rhythm.   Past Medical History:  Diagnosis Date  . Anxiety   . Anxiety and depression    related to caring for  mother during terminal illness  . Arthritis   . Asthma   . AVN (avascular necrosis of bone) (HCC)    hip and wrist  . Bronchitis    hx of  . COPD (chronic obstructive pulmonary disease) ([place])   . Depression   . Endometriosis   . FH: CAD (coronary artery disease)   . GERD (gastroesophageal reflux disease)    ocassional  . History of blood transfusion    after hip repackment - broke out in hives and started itching  . History of palpitations   . Hypertension   . Hypothyroid   . Osteopenia    forteo through Dr. Estanislado Pandy (started 10/12)  . PMR (polymyalgia rheumatica) (HCC)   . Pneumonia 2019  . SIRS (systemic inflammatory response syndrome) ([place]) 10/2017  . Smoker   . SOB (shortness of breath) on exertion   . Squamous cell carcinoma    facial, 2016  . Temporal arteritis (HCC)    s/p prednisone taper    Past Surgical History:  Procedure Laterality Date  . ABDOMINAL ADHESION SURGERY    . ABDOMINAL HYSTERECTOMY    . BREAST EXCISIONAL BIOPSY Left   . BREAST REDUCTION SURGERY Bilateral 06/13/2019   Procedure: MAMMARY REDUCTION  (BREAST);  Surgeon: Cindra Presume, MD;  Location: [place];  Service: Plastics;  Laterality: Bilateral;  . BREAST SURGERY Left    benign bx 1990  . CARDIAC CATHETERIZATION  2007   no PCI  . CHOLECYSTECTOMY    . COLONOSCOPY W/ POLYPECTOMY    .  INCONTINENCE SURGERY     2007   . JOINT REPLACEMENT Left 2012   left hip  . TONSILLECTOMY    . TOTAL HIP ARTHROPLASTY Right 10/04/2012   Procedure: TOTAL HIP ARTHROPLASTY;  Surgeon: Garald Balding, MD;  Location: [place];  Service: Orthopedics;  Laterality: Right;  . WRIST SURGERY Left    2012/ left wrist/ bone removed due to necrosis    Current Medications: Current Outpatient Medications on File Prior to Visit  Medication Sig  . acetaminophen (TYLENOL) 500 MG tablet Take 1,000 mg by mouth every 6 (six) hours as needed for moderate pain or headache.  . albuterol (VENTOLIN HFA) 108 (90 Base) MCG/ACT inhaler  Inhale 2 puffs into the lungs every 6 (six) hours as needed for wheezing or shortness of breath. Okay to dispense proair/Ventolin/albuterol.  . ALPRAZolam (XANAX) 0.5 MG tablet Take 0.5-1 tablets (0.25-0.5 mg total) by mouth 2 (two) times daily as needed. for anxiety  . Budeson-Glycopyrrol-Formoterol (BREZTRI AEROSPHERE) 160-9-4.8 MCG/ACT AERO Inhale 2 puffs into the lungs every 12 (twelve) hours.  Marland Kitchen buPROPion (WELLBUTRIN XL) 300 MG 24 hr tablet Take 1 tablet (300 mg total) by mouth daily.  . Calcium 500 MG CHEW Chew 500 mg by mouth 2 (two) times daily.   . Cholecalciferol (DIALYVITE VITAMIN D 5000) 125 MCG (5000 UT) capsule 1 tab a day except for 2 on Sundays  . fluticasone (FLONASE) 50 MCG/ACT nasal spray SPRAY 2 SPRAYS INTO EACH NOSTRIL EVERY DAY  . Homeopathic Products [facility] RELIEF EX) Apply topically. Applying prn to bicep muscle since shoulder break.  . levothyroxine (SYNTHROID) 125 MCG tablet TAKE 1 TABLET BY MOUTH  DAILY  . meclizine (ANTIVERT) 25 MG tablet Take 0.5-1 tablets (12.5-25 mg total) by mouth 3 (three) times daily as needed for dizziness.  . metoprolol succinate (TOPROL-XL) 25 MG 24 hr tablet TAKE 1 TABLET BY MOUTH  DAILY  . Multiple Vitamin (MULTIVITAMIN) tablet Take 1 tablet by mouth daily.  Vladimir Faster Glycol-Propyl Glycol (SYSTANE OP) Place 1 drop into both eyes daily as needed (dryness).   Marland Kitchen Spacer/Aero-Holding Chambers (AEROCHAMBER MV) inhaler Use as instructed  . tiZANidine (ZANAFLEX) 4 MG tablet Take 0.5-1 tablets (2-4 mg total) by mouth every 6 (six) hours as needed for muscle spasms.  . valsartan (DIOVAN) 40 MG tablet Take 1 tablet (40 mg total) by mouth daily.  . vitamin C (ASCORBIC ACID) 250 MG tablet Take 500 mg by mouth daily.   No current facility-administered medications on file prior to visit.     Allergies:   Sulfonamide derivatives, Alendronate sodium, and Dilaudid [hydromorphone hcl]   Social History   Tobacco Use  . Smoking status: Former Smoker     Packs/day: 0.33    Years: 34.00    Pack years: 11.22    Types: Cigarettes    Quit date: 10/24/2017    Years since quitting: 2.2  . Smokeless tobacco: Never Used  . Tobacco comment: quit smoking october 2019  Vaping Use  . Vaping Use: Former  . Devices: tried - quited prior to 2019  Substance Use Topics  . Alcohol use: Yes    Alcohol/week: 0.0 standard drinks    Comment: occasional- rarely  . Drug use: Never    Family History: family history includes Alzheimer's disease in her mother; Arthritis in her brother; Breast cancer in her maternal aunt; Colon cancer in her mother; Colon polyps in her brother; Healthy in her son; Heart disease in her mother; Hypertension in her mother; Kidney failure  in her mother; Suicidality in her brother.  ROS:   Please see the history of present illness.  Additional pertinent ROS otherwise unremarkable.  EKGs/Labs/Other Studies Reviewed:    The following studies were reviewed today: Echo 09/01/2017 - Left ventricle: The cavity size was normal. Systolic function was   mildly to moderately reduced. The estimated ejection fraction was   in the range of 40% to 45%. Diffuse hypokinesis. Doppler   parameters are consistent with abnormal left ventricular   relaxation (grade 1 diastolic dysfunction). - Left atrium: The atrium was normal in size. - Right ventricle: Systolic function was normal. - Pulmonary arteries: Systolic pressure was within the normal   range.  Cardiac cath 2006:  DESCRIPTION OF PROCEDURE:  The patient was brought to the second floor [facility] in a postabsorptive state.  She was  premedicated with p.o. Valium.  Her right groin was prepped and shaved in  the usual sterile fashion.  1% Xylocaine was used for local anesthesia.  A 6  French sheath was inserted into the right femoral artery using standard  Seldinger technique.  A 6 French right and left Judkins diagnostic catheters  as well as 6 French  pigtail catheter, were used for selective coronary  angiography, left ventriculography, and RCA angiography.  Visipaque dye was  used for the entirety of the case.  Retrograde aortic and left ventricular  pull back pressures were recorded.   HEMODYNAMIC DATA:  1.  Aortic systolic pressure 697, diastolic pressure 91.  2.  Left ventricular systolic pressure 948, end diastolic pressure 11.   SELECTIVE CORONARY ANGIOGRAPHY:  1.  Left main normal.  2.  LAD normal.  3.  The left circumflex was dominant normal.  4.  The right coronary artery was nondominant and was normal.   LEFT VENTRICULOGRAPHY:  RAO left ventriculogram was performed using 20 mL of  Visipaque dye at 10 mL per second.  The overall LVEF is greater than 50%  without wall motion abnormalities.   RCA ANGIOGRAPHY:  Performed in the LAO view using 20 mL of Visipaque dye at  20 mL per second, arch vessels were intact, there was no evidence of aortic  dissection.   IMPRESSION:  Ms. Delira has essentially normal coronary arteries, normal LV  function, no evidence of dissection.  I believe her chest pain is  noncardiac, most likely reflux related.  The sheath was removed and pressure  was held to achieve hemostasis.  The patient left the lab in stable  condition.  She will be observed overnight and discharged home in the  morning.  She will follow up with her primary care MD.  EKG:  EKG is personally reviewed.  The ekg ordered today demonstrates NSR with PVC at 96 bpm  Recent Labs: 08/25/2019: ALT 29; BUN 19; Creatinine, Ser 1.29; Hemoglobin 13.1; Platelets 210.0; Potassium 4.3; Pro B Natriuretic peptide (BNP) 35.0; Sodium 140 11/20/2019: TSH 1.08  Recent Lipid Panel    Component Value Date/Time   CHOL 181 11/20/2019 0818   TRIG 50.0 11/20/2019 0818   HDL 60.30 11/20/2019 0818   CHOLHDL 3 11/20/2019 0818   VLDL 10.0 11/20/2019 0818   LDLCALC 111 (H) 11/20/2019 0818    Physical Exam:    VS:  BP (!) 103/56   Pulse  96   Ht 5' 6"  (1.676 m)   Wt 194 lb 12.8 oz (88.4 kg)   SpO2 97%   BMI 31.44 kg/m     Wt Readings  from Last 3 Encounters:  02/07/20 194 lb 12.8 oz (88.4 kg)  12/18/19 187 lb 6 oz (85 kg)  11/24/19 186 lb 3 oz (84.5 kg)    GEN: Well nourished, well developed in no acute distress HEENT: Normal, moist mucous membranes NECK: No JVD CARDIAC: regular rhythm, normal S1 and S2, no rubs or gallops. No murmur. VASCULAR: Radial and DP pulses 2+ bilaterally. No carotid bruits RESPIRATORY:  Clear to auscultation without rales, wheezing or rhonchi  ABDOMEN: Soft, non-tender, non-distended MUSCULOSKELETAL:  Ambulates independently SKIN: Warm and dry, no edema NEUROLOGIC:  Alert and oriented x 3. No focal neuro deficits noted. PSYCHIATRIC:  Normal affect   ASSESSMENT:    1. Nonischemic cardiomyopathy ([place])   2. Aortic atherosclerosis ([place])   3. Family history of heart disease   4. Cardiac risk counseling   5. Counseling on health promotion and disease prevention    PLAN:    Abnormal echo, with reduced EF consistent with nonischemic cardiomyopathy: NYHA class I-II.  -stress test negative for ischemia, remote cath normal -see prior notes, declines further workup at this time -continue metoprolol succinate 25 mg daily, valsartan 40 mg daily. Limited by low blood pressure -clinically appears euvolemic -recheck echo PRN for change in symptoms  Aortic atherosclerosis: incidental on CT scans -continue to discuss aspirin, statin  Family history of heart disease: no premature disease, but multiple members of the family with ASCVD  Cardiac risk counseling and prevention recommendations: -recommend heart healthy/Mediterranean diet, with whole grains, fruits, vegetable, fish, lean meats, nuts, and olive oil. Limit salt. -recommend moderate walking, 3-5 times/week for 30-50 minutes each session. Aim for at least 150 minutes.week. Goal should be pace of 3 miles/hours, or walking 1.5 miles in 30  minutes -recommend avoidance of tobacco products. Avoid excess alcohol. -ASCVD risk score: The 10-year ASCVD risk score Mikey Bussing DC Brooke Bonito., et al., 2013) is: 5%   Values used to calculate the score:     Age: 28 years     Sex: Female     Is Non-Hispanic African American: No     Diabetic: No     Tobacco smoker: No     Systolic Blood Pressure: 102 mmHg     Is BP treated: Yes     HDL Cholesterol: 60.3 mg/dL     Total Cholesterol: 181 mg/dL    Plan for follow up: 1 year or sooner as needed  Buford Dresser, MD, PhD [place]  [facility]    Medication Adjustments/Labs and Tests Ordered: Current medicines are reviewed at length with the patient today.  Concerns regarding medicines are outlined above.  Orders Placed This Encounter  Procedures  . EKG 12-Lead   No orders of the defined types were placed in this encounter.   Patient Instructions  Medication Instructions:  Your Physician recommend you continue on your current medication as directed.    *If you need a refill on your cardiac medications before your next appointment, please call your pharmacy*   Lab Work: None   Testing/Procedures: None   Follow-Up: At [facility], you and your health needs are our priority.  As part of our continuing mission to provide you with exceptional heart care, we have created designated Provider Care Teams.  These Care Teams include your primary Cardiologist (physician) and Advanced Practice Providers (APPs -  Physician Assistants and Nurse Practitioners) who all work together to provide you with the care you need, when you need it.  We recommend signing up for the patient portal  called "MyChart".  Sign up information is provided on this After Visit Summary.  MyChart is used to connect with patients for Virtual Visits (Telemedicine).  Patients are able to view lab/test results, encounter notes, upcoming appointments, etc.  Non-urgent messages can be sent to your provider as well.    To learn more about what you can do with MyChart, go to [email or web address].    Your next appointment:   1 year(s)  The format for your next appointment:   In Person  Provider:   Buford Dresser, MD      Signed, Buford Dresser, MD PhD, [facility] 02/07/2020     [place]

## 2020-02-09 ENCOUNTER — Other Ambulatory Visit: Payer: Self-pay

## 2020-02-09 ENCOUNTER — Ambulatory Visit (AMBULATORY_SURGERY_CENTER): Payer: Self-pay | Admitting: *Deleted

## 2020-02-09 VITALS — Ht 66.0 in | Wt 185.0 lb

## 2020-02-09 DIAGNOSIS — Z86010 Personal history of colonic polyps: Secondary | ICD-10-CM

## 2020-02-09 MED ORDER — PLENVU 140 G PO SOLR: 1.0000 | Freq: Once | ORAL | 0 refills | Status: DC

## 2020-02-09 MED ORDER — PLENVU 140 G PO SOLR: 1.0000 | Freq: Once | ORAL | 0 refills | Status: AC

## 2020-02-09 NOTE — Progress Notes (Signed)
No egg or soy allergy known to patient  No issues with past sedation with any surgeries or procedures No intubation problems in the past  No FH of Malignant Hyperthermia No diet pills per patient No home 02 use per patient  No blood thinners per patient  Pt denies issues with constipation  No A fib or A flutter  EMMI video to pt or via MyChart  COVID 19 guidelines implemented in PV today with Pt and RN  Pt is fully vaccinated  for Covid   Coupon given to pt in PV today , Code to Pharmacy and  NO PA's for preps discussed with pt  In PV today   Due to the COVID-19 pandemic we are asking patients to follow certain guidelines.  Pt aware of COVID protocols and [facility] guidelines  

## 2020-02-13 DIAGNOSIS — S42252A Displaced fracture of greater tuberosity of left humerus, initial encounter for closed fracture: Secondary | ICD-10-CM | POA: Diagnosis not present

## 2020-02-13 DIAGNOSIS — M25512 Pain in left shoulder: Secondary | ICD-10-CM | POA: Diagnosis not present

## 2020-02-19 DIAGNOSIS — M25512 Pain in left shoulder: Secondary | ICD-10-CM | POA: Diagnosis not present

## 2020-02-19 DIAGNOSIS — S42252A Displaced fracture of greater tuberosity of left humerus, initial encounter for closed fracture: Secondary | ICD-10-CM | POA: Diagnosis not present

## 2020-02-20 DIAGNOSIS — M25512 Pain in left shoulder: Secondary | ICD-10-CM | POA: Diagnosis not present

## 2020-02-21 ENCOUNTER — Encounter: Payer: Self-pay | Admitting: Gastroenterology
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
## 2020-02-26 ENCOUNTER — Encounter: Payer: Self-pay | Admitting: Gastroenterology

## 2020-02-26 ENCOUNTER — Ambulatory Visit (AMBULATORY_SURGERY_CENTER): Payer: Medicare Other | Admitting: Gastroenterology

## 2020-02-26 ENCOUNTER — Other Ambulatory Visit: Payer: Self-pay

## 2020-02-26 VITALS — BP 143/75 | HR 104 | Temp 97.3°F | Resp 15 | Ht 66.0 in | Wt 185.0 lb

## 2020-02-26 DIAGNOSIS — K635 Polyp of colon: Secondary | ICD-10-CM | POA: Diagnosis not present

## 2020-02-26 DIAGNOSIS — D124 Benign neoplasm of descending colon: Secondary | ICD-10-CM

## 2020-02-26 DIAGNOSIS — D123 Benign neoplasm of transverse colon: Secondary | ICD-10-CM

## 2020-02-26 DIAGNOSIS — D120 Benign neoplasm of cecum: Secondary | ICD-10-CM

## 2020-02-26 DIAGNOSIS — Z860100 Personal history of colon polyps, unspecified: Secondary | ICD-10-CM

## 2020-02-26 MED ORDER — SODIUM CHLORIDE 0.9 % IV SOLN: 500.0000 mL | Freq: Once | INTRAVENOUS | Status: DC

## 2020-02-26 NOTE — Op Note (Signed)
[place] Patient Name: Syrina Wake Procedure Date: 02/26/2020 1:32 PM MRN: [id number] Endoscopist: Mallie Mussel L. Loletha Carrow , MD Age: 65 Referring MD:  Date of Birth: 11-29-1955 Gender: Female Account #: [id number] Procedure:                Colonoscopy Indications:              High risk colon cancer surveillance: Personal                            history of sessile serrated colon polyp (10 mm or                            greater in size) Medicines:                Monitored Anesthesia Care Procedure:                Pre-Anesthesia Assessment:                           - Prior to the procedure, a History and Physical                            was performed, and patient medications and                            allergies were reviewed. The patient's tolerance of                            previous anesthesia was also reviewed. The risks                            and benefits of the procedure and the sedation                            options and risks were discussed with the patient.                            All questions were answered, and informed consent                            was obtained. Prior Anticoagulants: The patient has                            taken no previous anticoagulant or antiplatelet                            agents. ASA Grade Assessment: III - A patient with                            severe systemic disease. After reviewing the risks                            and benefits, the patient was deemed in  satisfactory condition to undergo the procedure.                           After obtaining informed consent, the colonoscope                            was passed under direct vision. Throughout the                            procedure, the patient's blood pressure, pulse, and                            oxygen saturations were monitored continuously. The                            Olympus CF-HQ190L [id number]) Colonoscope was                             introduced through the anus and advanced to the the                            cecum, identified by appendiceal orifice and                            ileocecal valve. The colonoscopy was performed with                            difficulty due to multiple diverticula in the                            colon, a redundant colon and a tortuous colon. The                            procedure was also performed with the patient                            supine due to pain from a recent left shoulder                            fracture. Successful completion of the procedure                            was aided by using manual pressure. The patient                            tolerated the procedure well. The quality of the                            bowel preparation was good (lavage needed, more so                            left colon due to diverticulosis). The ileocecal  valve, appendiceal orifice, and rectum were                            photographed. Scope In: 1:46:33 PM Scope Out: 2:41:49 PM Scope Withdrawal Time: 0 hours 47 minutes 34 seconds  Total Procedure Duration: 0 hours 55 minutes 16 seconds  Findings:                 The perianal and digital rectal examinations were                            normal.                           A diminutive polyp was found in the cecum. The                            polyp was sessile. The polyp was removed with a                            cold snare. Resection and retrieval were complete.                           A 20 mm polyp was found in the transverse colon.                            The polyp was multi-lobulated and semi-sessile. The                            polyp was removed with a piecemeal technique using                            a cold snare. Resection and retrieval were                            complete. To prevent bleeding post-intervention,                            one  hemostatic clip was successfully placed (MR                            conditional). Area was tattooed with an injection                            of 0.5 mL of Spot (carbon black).                           Many diverticula were found in the left colon.                           A 35 mm polyp was found in the descending colon.                            The polyp was semi-sessile. The polyp was removed  with a piecemeal technique using a cold snare.                            Resection and retrieval were complete. To prevent                            bleeding after the polypectomy, two hemostatic                            clips were successfully placed (MR conditional).                           The exam was otherwise without abnormality on                            direct and retroflexion views. Complications:            No immediate complications. Estimated Blood Loss:     Estimated blood loss was minimal. Impression:               - One diminutive polyp in the cecum, removed with a                            cold snare. Resected and retrieved.                           - One 20 mm polyp in the transverse colon, removed                            piecemeal using a cold snare. Resected and                            retrieved. Clip (MR conditional) was placed.                            Tattooed.                           - Diverticulosis in the left colon.                           - One 35 mm polyp in the descending colon, removed                            piecemeal using a cold snare. Resected and                            retrieved. Clips (MR conditional) were placed.                           - The examination was otherwise normal on direct                            and retroflexion views. Recommendation:           - Patient has  a contact number available for                            emergencies. The signs and symptoms of potential                             delayed complications were discussed with the                            patient. Return to normal activities tomorrow.                            Written discharge instructions were provided to the                            patient.                           - Resume previous diet.                           - Continue present medications.                           - Await pathology results.                           - Repeat colonoscopy in 6 months for surveillance. Deral Schellenberg L. Loletha Carrow, MD 02/26/2020 2:53:00 PM This report has been signed electronically.

## 2020-02-26 NOTE — Progress Notes (Signed)
Report given to PACU, vss 

## 2020-02-26 NOTE — Progress Notes (Signed)
Vtials by NS

## 2020-02-26 NOTE — Patient Instructions (Signed)
Impression/Recommendations:  Polyp and diverticulosis handouts given to patient.  Resume previous diet. Continue present medications. Await pathology results.  Repeat colonoscopy in 6 months for surveillance.  YOU HAD AN ENDOSCOPIC PROCEDURE TODAY AT [facility]:   Refer to the procedure report that was given to you for any specific questions about what was found during the examination.  If the procedure report does not answer your questions, please call your gastroenterologist to clarify.  If you requested that your care partner not be given the details of your procedure findings, then the procedure report has been included in a sealed envelope for you to review at your convenience later.  YOU SHOULD EXPECT: Some feelings of bloating in the abdomen. Passage of more gas than usual.  Walking can help get rid of the air that was put into your GI tract during the procedure and reduce the bloating. If you had a lower endoscopy (such as a colonoscopy or flexible sigmoidoscopy) you may notice spotting of blood in your stool or on the toilet paper. If you underwent a bowel prep for your procedure, you may not have a normal bowel movement for a few days.  Please Note:  You might notice some irritation and congestion in your nose or some drainage.  This is from the oxygen used during your procedure.  There is no need for concern and it should clear up in a day or so.  SYMPTOMS TO REPORT IMMEDIATELY:   Following lower endoscopy (colonoscopy or flexible sigmoidoscopy):  Excessive amounts of blood in the stool  Significant tenderness or worsening of abdominal pains  Swelling of the abdomen that is new, acute  Fever of 100F or higher For urgent or emergent issues, a gastroenterologist can be reached at any hour by calling [phone]. Do not use MyChart messaging for urgent concerns.    DIET:  We do recommend a small meal at first, but then you may proceed to your regular diet.   Drink plenty of fluids but you should avoid alcoholic beverages for 24 hours.  ACTIVITY:  You should plan to take it easy for the rest of today and you should NOT DRIVE or use heavy machinery until tomorrow (because of the sedation medicines used during the test).    FOLLOW UP: Our staff will call the number listed on your records 48-72 hours following your procedure to check on you and address any questions or concerns that you may have regarding the information given to you following your procedure. If we do not reach you, we will leave a message.  We will attempt to reach you two times.  During this call, we will ask if you have developed any symptoms of COVID 19. If you develop any symptoms (ie: fever, flu-like symptoms, shortness of breath, cough etc.) before then, please call [phone].  If you test positive for Covid 19 in the 2 weeks post procedure, please call and report this information to [place].    If any biopsies were taken you will be contacted by phone or by letter within the next 1-3 weeks.  Please call us at [phone] if you have not heard about the biopsies in 3 weeks.    SIGNATURES/CONFIDENTIALITY: You and/or your care partner have signed paperwork which will be entered into your electronic medical record.  These signatures attest to the fact that that the information above on your After Visit Summary has been reviewed and is understood.  Full responsibility of the confidentiality of this  discharge information lies with you and/or your care-partner. 

## 2020-02-26 NOTE — Progress Notes (Signed)
Called to room to assist during endoscopic procedure.  Patient ID and intended procedure confirmed with present staff. Received instructions for my participation in the procedure from the performing physician.  

## 2020-02-26 NOTE — Progress Notes (Signed)
1345 HR > 100 with esmolol 25 mg given IV, MD updated. Case to be done in supine position due to  Patient having broke left shoulder in past and still very sore. Feb vss

## 2020-02-28 ENCOUNTER — Telehealth: Payer: Self-pay

## 2020-02-28 NOTE — Telephone Encounter (Signed)
  Follow up Call-  Call back number 02/26/2020  Post procedure Call Back phone  # [phone]  Permission to leave phone message Yes  Some recent data might be hidden     Patient questions:  Do you have a fever, pain , or abdominal swelling? No. Pain Score  0 *  Have you tolerated food without any problems? Yes.    Have you been able to return to your normal activities? Yes.    Do you have any questions about your discharge instructions: Diet   No. Medications  No. Follow up visit  No.  Do you have questions or concerns about your Care? No.  Actions: * If pain score is 4 or above: 1. No action needed, pain <4.Have you developed a fever since your procedure? no  2.   Have you had an respiratory symptoms (SOB or cough) since your procedure? no  3.   Have you tested positive for COVID 19 since your procedure no  4.   Have you had any family members/close contacts diagnosed with the COVID 19 since your procedure?  no   If yes to any of these questions please route to Joylene John, RN and Joella Prince, RN

## 2020-03-01 ENCOUNTER — Encounter: Payer: Medicare Other | Admitting: Gastroenterology

## 2020-03-06 ENCOUNTER — Encounter: Payer: Self-pay | Admitting: Gastroenterology

## 2020-03-07 ENCOUNTER — Encounter: Payer: Self-pay | Admitting: Rheumatology

## 2020-03-07 ENCOUNTER — Ambulatory Visit: Payer: Medicare Other | Admitting: Rheumatology

## 2020-03-07 ENCOUNTER — Other Ambulatory Visit: Payer: Self-pay

## 2020-03-07 VITALS — BP 159/89 | HR 90 | Ht 66.0 in | Wt 188.4 lb

## 2020-03-07 DIAGNOSIS — Z8639 Personal history of other endocrine, nutritional and metabolic disease: Secondary | ICD-10-CM

## 2020-03-07 DIAGNOSIS — Z96643 Presence of artificial hip joint, bilateral: Secondary | ICD-10-CM

## 2020-03-07 DIAGNOSIS — M316 Other giant cell arteritis: Secondary | ICD-10-CM

## 2020-03-07 DIAGNOSIS — Z8709 Personal history of other diseases of the respiratory system: Secondary | ICD-10-CM | POA: Diagnosis not present

## 2020-03-07 DIAGNOSIS — M8589 Other specified disorders of bone density and structure, multiple sites: Secondary | ICD-10-CM | POA: Diagnosis not present

## 2020-03-07 DIAGNOSIS — Z8659 Personal history of other mental and behavioral disorders: Secondary | ICD-10-CM

## 2020-03-07 DIAGNOSIS — M353 Polymyalgia rheumatica: Secondary | ICD-10-CM

## 2020-03-07 DIAGNOSIS — Z87891 Personal history of nicotine dependence: Secondary | ICD-10-CM

## 2020-03-07 DIAGNOSIS — R2689 Other abnormalities of gait and mobility: Secondary | ICD-10-CM

## 2020-03-07 DIAGNOSIS — F17200 Nicotine dependence, unspecified, uncomplicated: Secondary | ICD-10-CM

## 2020-03-07 DIAGNOSIS — M5136 Other intervertebral disc degeneration, lumbar region: Secondary | ICD-10-CM

## 2020-03-07 DIAGNOSIS — Z8781 Personal history of (healed) traumatic fracture: Secondary | ICD-10-CM | POA: Diagnosis not present

## 2020-03-07 DIAGNOSIS — M51369 Other intervertebral disc degeneration, lumbar region without mention of lumbar back pain or lower extremity pain: Secondary | ICD-10-CM

## 2020-03-19 ENCOUNTER — Ambulatory Visit: Payer: Medicare Other | Admitting: Pulmonary Disease

## 2020-03-20 ENCOUNTER — Encounter: Payer: Self-pay | Admitting: Pulmonary Disease

## 2020-03-20 ENCOUNTER — Other Ambulatory Visit: Payer: Self-pay

## 2020-03-20 ENCOUNTER — Ambulatory Visit: Payer: Medicare Other | Admitting: Pulmonary Disease

## 2020-03-20 VITALS — BP 128/72 | HR 90 | Temp 97.4°F | Ht 66.0 in | Wt 182.4 lb

## 2020-03-20 DIAGNOSIS — I428 Other cardiomyopathies: Secondary | ICD-10-CM

## 2020-03-20 DIAGNOSIS — J449 Chronic obstructive pulmonary disease, unspecified: Secondary | ICD-10-CM | POA: Diagnosis not present

## 2020-03-20 DIAGNOSIS — Z87891 Personal history of nicotine dependence: Secondary | ICD-10-CM | POA: Diagnosis not present

## 2020-03-20 DIAGNOSIS — J432 Centrilobular emphysema: Secondary | ICD-10-CM | POA: Diagnosis not present

## 2020-03-20 DIAGNOSIS — R911 Solitary pulmonary nodule: Secondary | ICD-10-CM

## 2020-03-20 DIAGNOSIS — I5022 Chronic systolic (congestive) heart failure: Secondary | ICD-10-CM

## 2020-03-20 DIAGNOSIS — Z79899 Other long term (current) drug therapy: Secondary | ICD-10-CM

## 2020-03-20 MED ORDER — ALBUTEROL SULFATE HFA 108 (90 BASE) MCG/ACT IN AERS: 2.0000 | INHALATION_SPRAY | Freq: Four times a day (QID) | RESPIRATORY_TRACT | 11 refills | Status: DC | PRN

## 2020-03-20 MED ORDER — AZITHROMYCIN 250 MG PO TABS: 250.0000 mg | ORAL_TABLET | ORAL | 3 refills | Status: AC
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
## 2020-04-02 DIAGNOSIS — M25512 Pain in left shoulder: Secondary | ICD-10-CM | POA: Diagnosis not present

## 2020-04-08 ENCOUNTER — Telehealth: Payer: Medicare Other

## 2020-04-08 ENCOUNTER — Telehealth: Payer: Self-pay

## 2020-04-08 NOTE — Progress Notes (Deleted)
Chronic Care Management Pharmacy Note  04/08/2020 Name:  April [name] MRN:  [id number] DOB:  Aug 16, 1955  Subjective: April [name] is an 64 y.o. year old female who is a primary patient of Damita Dunnings, Elveria Rising, MD.  The CCM team was consulted for assistance with disease management and care coordination needs.    Engaged with patient by telephone for follow up visit in response to provider referral for pharmacy case management and/or care coordination services.   Consent to Services:  The patient was given information about Chronic Care Management services, agreed to services, and gave verbal consent prior to initiation of services.  Please see initial visit note for detailed documentation.   Patient Care Team: [name], MD as PCP - General (Family Medicine) Buford Dresser, MD as PCP - Cardiology (Cardiology) Bo Merino, MD as Consulting Physician (Rheumatology) Burnice Logan, [facility] as Pharmacist (Pharmacist)  Initial CCM 10/13/19  Recent office visits: 11/24/19 - PCP - Increase Vitamin D3 5000 IU daily with one extra pill on Sundays. Doing well on current dose levothyroxine.   Recent consult visits: 03/20/20 - Pulmonology - Follow-up regarding COPD after initiation of azithromycin Monday Wednesday Friday.  Patient doing much better from a respiratory standpoint.  She does feel like she has improved.  She is using her albuterol less frequently. Still using it approximately 3-5 times per week.  Breztri daily, refills as needed from drug company. Albuterol as needed  Azithromycin MWF continued and new refill  03/07/20 - Rheumatology - Temporal arteritis - denies any headaches, osteopenia - DEXA 11/20, T score -2.1 taken off Boniva, continue vitamin D supplement. Polymyalgia rheumatica - in remission. No med changes, RTC 10 months 02/07/20 - Cardiology - Establish care, Abnormal echo, with reduced EF consistent with nonischemic cardiomyopathy, NYHA class I-II.  Continue metoprolol succinate 25 mg daily, valsartan 40 mg daily. Limited by low blood pressure. Aortic atherosclerosis: incidental on CT scan, continue to discuss aspirin, statin. RTC 1 year or sooner as needed.   Hospital visits: None in previous 6 months  Objective:  Lab Results  Component Value Date   CREATININE 1.29 (H) 08/25/2019   BUN 19 08/25/2019   GFR 41.29 (L) 08/25/2019   GFRNONAA 43 (L) 06/13/2019   GFRAA 50 (L) 06/13/2019   NA 140 08/25/2019   K 4.3 08/25/2019   CALCIUM 9.8 08/25/2019   CO2 28 08/25/2019   GLUCOSE 104 (H) 08/25/2019    Lab Results  Component Value Date/Time   GFR 41.29 (L) 08/25/2019 08:15 AM   GFR 45.02 (L) 08/23/2018 08:38 AM    Lab Results  Component Value Date   CHOL 181 11/20/2019   HDL 60.30 11/20/2019   LDLCALC 111 (H) 11/20/2019   TRIG 50.0 11/20/2019   CHOLHDL 3 11/20/2019    Hepatic Function Latest Ref Rng & Units 08/25/2019 08/23/2018 10/27/2017  Total Protein 6.0 - 8.3 g/dL 7.3 7.5 7.3  Albumin 3.5 - 5.2 g/dL 4.2 4.4 3.1(L)  AST 0 - 37 U/L 27 16 59(H)  ALT 0 - 35 U/L 29 15 50(H)  Alk Phosphatase 39 - 117 U/L 100 95 98  Total Bilirubin 0.2 - 1.2 mg/dL 0.4 0.3 0.6    Lab Results  Component Value Date/Time   TSH 1.08 11/20/2019 08:18 AM   TSH 0.04 (L) 08/25/2019 08:15 AM   FREET4 1.01 06/28/2017 04:38 PM   FREET4 1.41 03/30/2011 08:44 AM    CBC Latest Ref Rng & Units 08/25/2019 06/13/2019 12/09/2017  WBC 4.0 -  10.5 K/uL 7.0 6.7 6.6  Hemoglobin 12.0 - 15.0 g/dL 13.1 13.1 12.9  Hematocrit 36.0 - 46.0 % 39.5 41.3 38.8  Platelets 150.0 - 400.0 K/uL 210.0 189 223.0    Lab Results  Component Value Date/Time   VD25OH 29.76 (L) 11/20/2019 08:18 AM   VD25OH 72.36 08/23/2018 08:38 AM    Clinical ASCVD: No  The 10-year ASCVD risk score Mikey Bussing DC Jr., et al., 2013) is: 8.6%   Values used to calculate the score:     Age: 32 years     Sex: Female     Is Non-Hispanic African American: No     Diabetic: No     Tobacco smoker:  No     Systolic Blood Pressure: 419 mmHg     Is BP treated: Yes     HDL Cholesterol: 60.3 mg/dL     Total Cholesterol: 181 mg/dL    Depression screen [facility] 2/9 11/22/2019 07/17/2019 08/26/2018  Decreased Interest 0 0 0  Down, Depressed, Hopeless 0 0 0  PHQ - 2 Score 0 0 0  Altered sleeping 0 0 -  Tired, decreased energy 0 0 -  Change in appetite 0 0 -  Feeling bad or failure about yourself  0 0 -  Trouble concentrating 0 0 -  Moving slowly or fidgety/restless 0 0 -  Suicidal thoughts 0 0 -  PHQ-9 Score 0 0 -  Difficult doing work/chores Not difficult at all Not difficult at all -  Some recent data might be hidden     Social History   Tobacco Use  Smoking Status Former Smoker  . Packs/day: 0.33  . Years: 34.00  . Pack years: 11.22  . Types: Cigarettes  . Quit date: 10/24/2017  . Years since quitting: 2.4  Smokeless Tobacco Never Used  Tobacco Comment   quit smoking october 2019   BP Readings from Last 3 Encounters:  03/20/20 128/72  03/07/20 (!) 159/89  02/26/20 (!) 143/75   Pulse Readings from Last 3 Encounters:  03/20/20 90  03/07/20 90  02/26/20 (!) 104   Wt Readings from Last 3 Encounters:  03/20/20 182 lb 6 oz (82.7 kg)  03/07/20 188 lb 6.4 oz (85.5 kg)  02/26/20 185 lb (83.9 kg)   BMI Readings from Last 3 Encounters:  03/20/20 29.44 kg/m  03/07/20 30.41 kg/m  02/26/20 29.86 kg/m    Assessment/Interventions: Review of patient past medical history, allergies, medications, health status, including review of consultants reports, laboratory and other test data, was performed as part of comprehensive evaluation and provision of chronic care management services.   SDOH:  (Social Determinants of Health) assessments and interventions performed: Yes  SDOH Screenings   Alcohol Screen: Low Risk   . Last Alcohol Screening Score (AUDIT): 1  Depression (PHQ2-9): Low Risk   . PHQ-2 Score: 0  Financial Resource Strain: Low Risk   . Difficulty of Paying Living  Expenses: Not hard at all  Food Insecurity: No Food Insecurity  . Worried About Charity fundraiser in the Last Year: Never true  . Ran Out of Food in the Last Year: Never true  Housing: Low Risk   . Last Housing Risk Score: 0  Physical Activity: Inactive  . Days of Exercise per Week: 0 days  . Minutes of Exercise per Session: 0 min  Social Connections: Not on file  Stress: No Stress Concern Present  . Feeling of Stress : Not at all  Tobacco Use: Medium Risk  . Smoking Tobacco  Use: Former Smoker  . Smokeless Tobacco Use: Never Used  Transportation Needs: No Transportation Needs  . Lack of Transportation (Medical): No  . Lack of Transportation (Non-Medical): No    CCM Care Plan  Allergies  Allergen Reactions  . Sulfonamide Derivatives     As a child.  Throat closed, rash.  . Alendronate Sodium     GI upset  . Dilaudid [Hydromorphone Hcl] Nausea And Vomiting    Medications Reviewed Today    Reviewed by Elie Confer, CMA (Certified Medical Assistant) on 03/20/20 at 1333  Med List Status: <None>  Medication Order Taking? Sig Documenting Provider Last Dose Status Informant  acetaminophen (TYLENOL) 500 MG tablet [id number] Yes Take 1,000 mg by mouth every 6 (six) hours as needed for moderate pain or headache. [provider] Taking Active Self  albuterol (VENTOLIN HFA) 108 (90 Base) MCG/ACT inhaler [id number] Yes Inhale 2 puffs into the lungs every 6 (six) hours as needed for wheezing or shortness of breath. Okay to dispense proair/Ventolin/albuterol. Garner Nash, DO Taking Active   ALPRAZolam Duanne Moron) 0.5 MG tablet [id number] Yes Take 0.5-1 tablets (0.25-0.5 mg total) by mouth 2 (two) times daily as needed. for anxiety [name], MD Taking Active   Budeson-Glycopyrrol-Formoterol [facility] AEROSPHERE) 160-9-4.8 MCG/ACT Hollie Salk [id number] Yes Inhale 2 puffs into the lungs every 12 (twelve) hours. Lauraine Rinne, NP Taking Active   buPROPion (WELLBUTRIN XL) 300 MG 24 hr  tablet [id number] Yes Take 1 tablet (300 mg total) by mouth daily. [name], MD Taking Active   Calcium 500 MG CHEW [id number] Yes Chew 500 mg by mouth 2 (two) times daily.  [provider] Taking Active Self  Cholecalciferol (DIALYVITE VITAMIN D 5000) 125 MCG (5000 UT) capsule [id number] Yes 1 tab a day except for 2 on Sundays [name], MD Taking Active   Homeopathic Products ([place]) [id number] Yes Apply topically. Applying prn to bicep muscle since shoulder break. [provider] Taking Active   levothyroxine (SYNTHROID) 125 MCG tablet [id number] Yes TAKE 1 TABLET BY MOUTH  DAILY [name], MD Taking Active   metoprolol succinate (TOPROL-XL) 25 MG 24 hr tablet [id number] Yes TAKE 1 TABLET BY MOUTH  DAILY [name], MD Taking Active   Polyethyl Glycol-Propyl Glycol [facility] OP) [id number] Yes Place 1 drop into both eyes daily as needed (dryness).  [provider] Taking Active Self  Spacer/Aero-Holding Chambers (AEROCHAMBER MV) inhaler [id number] Yes Use as instructed [name], NP Taking Active Self  tiZANidine (ZANAFLEX) 4 MG tablet [id number] Yes Take 0.5-1 tablets (2-4 mg total) by mouth every 6 (six) hours as needed for muscle spasms. [name], MD Taking Active   valsartan (DIOVAN) 40 MG tablet [id number] Yes Take 1 tablet (40 mg total) by mouth daily. [name], MD Taking Active   vitamin C (ASCORBIC ACID) 250 MG tablet [id number] Yes Take 500 mg by mouth daily. [provider] Taking Active Self          Patient Active Problem List   Diagnosis Date Noted  . Healthcare maintenance 11/26/2019  . Medication management 08/25/2019  . Macromastia 05/31/2019  . Allergic rhinitis 04/06/2019  . Pain in left ankle and joints of left foot 03/30/2019  . Medicare welcome exam 08/29/2018  . Aortic atherosclerosis ([place]) 12/17/2017  . Pulmonary nodule 12/17/2017  . Muscle spasm 12/13/2017  . GERD  (gastroesophageal reflux disease) 12/12/2017  . CAP (community acquired pneumonia) 11/24/2017  . Left-sided  chest wall pain 11/24/2017  . CKD (chronic kidney disease), stage III ([place]) 10/28/2017  . Dizziness 10/27/2017  . Headache 10/27/2017  . Creatinine elevation 08/26/2017  . SOB (shortness of breath) 08/26/2017  . Tachycardia 06/29/2017  . HTN (hypertension) 06/23/2017  . Polymyalgia rheumatica ([place]) 05/22/2016  . History of bilateral hip replacements 05/22/2016  . DDD (degenerative disc disease), lumbar 05/22/2016  . BPV (benign positional vertigo) 05/20/2016  . Colon cancer screening 08/20/2015  . Advance care planning 08/14/2014  . Vitamin D deficiency 08/14/2014  . COPD (chronic obstructive pulmonary disease) ([place]) 06/04/2014  . Avascular necrosis of bone of right hip ([place]) 10/06/2012  . Routine general medical examination at a health care facility 04/07/2011  . Osteopenia 06/09/2010  . AVN (avascular necrosis of bone) ([place]) 05/16/2010  . Asthma 03/23/2010  . Osteoarthritis 03/23/2010  . Hypothyroidism 03/21/2010  . ANXIETY DEPRESSION 03/21/2010  . Former smoker 03/21/2010  . SKIN LESION 03/21/2010  . TEMPORAL ARTERITIS 03/21/2010    Immunization History  Administered Date(s) Administered  . Fluad Quad(high Dose 65+) 08/26/2018, 11/24/2019  . Influenza Split 10/15/2010  . Influenza,inj,Quad PF,6+ Mos 10/05/2012, 10/24/2013, 10/12/2014, 11/12/2015, 10/16/2016, 10/14/2017  . Influenza-Unspecified 08/26/2018  . PFIZER(Purple Top)SARS-COV-2 Vaccination 03/05/2019, 04/04/2019, 10/27/2019  . Pneumococcal Conjugate-13 10/26/2018  . Pneumococcal Polysaccharide-23 01/05/2009, 11/24/2019  . Tdap 04/06/2011    Conditions to be addressed/monitored:  Hypertension and COPD  There are no care plans that you recently modified to display for this patient.   Current Barriers:  . {pharmacybarriers:24917} . ***  Pharmacist Clinical Goal(s):  Marland Kitchen Patient will  {PHARMACYGOALCHOICES:24921} through collaboration with PharmD and provider.  . ***  Interventions: . 1:1 collaboration with [name], MD regarding development and update of comprehensive plan of care as evidenced by provider attestation and co-signature . Inter-disciplinary care team collaboration (see longitudinal plan of care) . Comprehensive medication review performed; medication list updated in electronic medical record  Hypertension (BP goal {CHL HP UPSTREAM Pharmacist BP ranges:[phone]}) -{US controlled/uncontrolled:25276} -Current treatment: . Metoprolol succinate 25 mg - 1 tablet daily . Valsartan 40 mg - 1 tablet daily -Medications previously tried: ***  -Current home readings: *** -Current dietary habits: *** -Current exercise habits: *** -{ACTIONS;DENIES/REPORTS:[id number]::"Denies"} hypotensive/hypertensive symptoms -Educated on {CCM BP Counseling:25124} -Counseled to monitor BP at home ***, document, and provide log at future appointments -{CCMPHARMDINTERVENTION:25122}  Aortic Atherosclerosis: (LDL goal < 70) -{US controlled/uncontrolled:25276} -Current treatment: . None -Medications previously tried: ***  -Current dietary patterns: *** -Current exercise habits: *** -Educated on {CCM HLD Counseling:25126} -{CCMPHARMDINTERVENTION:25122}  COPD (Goal: control symptoms and prevent exacerbations) -{US controlled/uncontrolled:25276} -Current treatment  . Breztri (PAP) - 2 puffs every 12 hours  . Albuterol - 2 puffs every 6 hours as needed . Azithromycin 250 mg - 1 tablet on Mon, Wed, and Friday -Medications previously tried: ***  -Gold Grade: {CHL HP Upstream Pharm COPD Gold OHYWV:[id number]} -Current COPD Classification:  {CHL HP Upstream Pharm COPD Classification:[phone]} -MMRC/CAT score: *** -Pulmonary function testing: *** -Exacerbations requiring treatment in last 6 months: *** -Patient {Actions; denies-reports:120008} consistent use of maintenance  inhaler -Frequency of rescue inhaler use: *** -Counseled on {CCMINHALERCOUNSELING:25121} -{CCMPHARMDINTERVENTION:25122}  Depression/Anxiety (Goal: ***) -{US controlled/uncontrolled:25276} -Current treatment: . Bupropion 300 mg - 1 tablet daily  . Alprazolam 0.5 mg - 1/2-1 tablet twice daily as needed -Medications previously tried/failed: *** -PHQ9: *** -GAD7: *** -Connected with *** for mental health support -Educated on {CCM mental health counseling:25127} -{CCMPHARMDINTERVENTION:25122}  *** (Goal: ***) -{US controlled/uncontrolled:25276} -Current treatment  . *** -Medications previously tried: ***  -{  CCMPHARMDINTERVENTION:25122}   Patient Goals/Self-Care Activities . Patient will:  - {pharmacypatientgoals:24919}  Follow Up Plan: {CM FOLLOW UP LFYB:01751}   Medication Assistance: {MEDASSISTANCEINFO:25044}  Patient's preferred pharmacy is:  [facility], [address], [address] [place], [place] [address] Phone: [phone] Fax: [id number] [address], [place] [address] Phone: [phone] Fax: [phone]  Uses pill box? {Yes or If no, why not?:20788} Pt endorses ***% compliance  We discussed: {Pharmacy options:24294} Patient decided to: {US Pharmacy NLZJ:67341}  Care Plan and Follow Up Patient Decision:  {FOLLOWUP:24991}  Plan: {CM FOLLOW UP PFXT:02409}  Debbora Dus, PharmD Clinical Pharmacist [facility] [phone]

## 2020-04-18 ENCOUNTER — Other Ambulatory Visit: Payer: Self-pay | Admitting: Family Medicine

## 2020-04-18 DIAGNOSIS — Z1231 Encounter for screening mammogram for malignant neoplasm of breast: Secondary | ICD-10-CM

## 2020-04-20 ENCOUNTER — Other Ambulatory Visit: Payer: Self-pay

## 2020-04-20 ENCOUNTER — Ambulatory Visit
Admission: RE | Admit: 2020-04-20 | Discharge: 2020-04-20 | Disposition: A | Discharge status: Home / Self Care | Payer: Medicare Other | Source: Ambulatory Visit | Attending: Family Medicine | Admitting: Family Medicine

## 2020-04-20 DIAGNOSIS — Z1231 Encounter for screening mammogram for malignant neoplasm of breast: Secondary | ICD-10-CM

## 2020-04-26 NOTE — Telephone Encounter (Signed)
Chronic Care Management Pharmacy Note  04/26/2020 Name:  April [name] MRN:  [id number] DOB:  August 02, 1955  Subjective: April [name] is an 65 y.o. year old female who is a primary patient of April [name], Elveria Rising, MD.  The CCM team was consulted for assistance with disease management and care coordination needs.    Attempted to reach patient by telephone for follow up visit in response to provider referral for pharmacy case management and/or care coordination services. Unable to reach patient for follow up visit, left VM. Chart reviewed prior to visit.  Consent to Services:  The patient was given information about Chronic Care Management services, agreed to services, and gave verbal consent prior to initiation of services.  Please see initial visit note for detailed documentation.   Patient Care Team: [name], MD as PCP - General (Family Medicine) Buford Dresser, MD as PCP - Cardiology (Cardiology) Bo Merino, MD as Consulting Physician (Rheumatology) Burnice Logan, [facility] as Pharmacist (Pharmacist)  Initial CCM 10/13/19  Recent office visits: 11/24/19 - PCP - Increase Vitamin D3 5000 IU daily with one extra pill on Sundays. Doing well on current dose levothyroxine.   Recent consult visits: 03/20/20 - Pulmonology - Follow-up regarding COPD after initiation of azithromycin Monday Wednesday Friday.  Patient doing much better from a respiratory standpoint.  She does feel like she has improved.  She is using her albuterol less frequently. Still using it approximately 3-5 times per week.  Breztri daily, refills as needed from drug company. Albuterol as needed  Azithromycin MWF continued and new refill  03/07/20 - Rheumatology - Temporal arteritis - denies any headaches, osteopenia - DEXA 11/20, T score -2.1 taken off Boniva, continue vitamin D supplement. Polymyalgia rheumatica - in remission. No med changes, RTC 10 months 02/07/20 - Cardiology - Establish care,  Abnormal echo, with reduced EF consistent with nonischemic cardiomyopathy, NYHA class I-II. Continue metoprolol succinate 25 mg daily, valsartan 40 mg daily. Limited by low blood pressure. Aortic atherosclerosis: incidental on CT scan, continue to discuss aspirin, statin. RTC 1 year or sooner as needed.   Hospital visits: None in previous 6 months  Objective:  Lab Results  Component Value Date   CREATININE 1.29 (H) 08/25/2019   BUN 19 08/25/2019   GFR 41.29 (L) 08/25/2019   GFRNONAA 43 (L) 06/13/2019   GFRAA 50 (L) 06/13/2019   NA 140 08/25/2019   K 4.3 08/25/2019   CALCIUM 9.8 08/25/2019   CO2 28 08/25/2019   GLUCOSE 104 (H) 08/25/2019    Lab Results  Component Value Date/Time   GFR 41.29 (L) 08/25/2019 08:15 AM   GFR 45.02 (L) 08/23/2018 08:38 AM    Lab Results  Component Value Date   CHOL 181 11/20/2019   HDL 60.30 11/20/2019   LDLCALC 111 (H) 11/20/2019   TRIG 50.0 11/20/2019   CHOLHDL 3 11/20/2019    Hepatic Function Latest Ref Rng & Units 08/25/2019 08/23/2018 10/27/2017  Total Protein 6.0 - 8.3 g/dL 7.3 7.5 7.3  Albumin 3.5 - 5.2 g/dL 4.2 4.4 3.1(L)  AST 0 - 37 U/L 27 16 59(H)  ALT 0 - 35 U/L 29 15 50(H)  Alk Phosphatase 39 - 117 U/L 100 95 98  Total Bilirubin 0.2 - 1.2 mg/dL 0.4 0.3 0.6    Lab Results  Component Value Date/Time   TSH 1.08 11/20/2019 08:18 AM   TSH 0.04 (L) 08/25/2019 08:15 AM   FREET4 1.01 06/28/2017 04:38 PM   FREET4 1.41 03/30/2011 08:44 AM  CBC Latest Ref Rng & Units 08/25/2019 06/13/2019 12/09/2017  WBC 4.0 - 10.5 K/uL 7.0 6.7 6.6  Hemoglobin 12.0 - 15.0 g/dL 13.1 13.1 12.9  Hematocrit 36.0 - 46.0 % 39.5 41.3 38.8  Platelets 150.0 - 400.0 K/uL 210.0 189 223.0    Lab Results  Component Value Date/Time   VD25OH 29.76 (L) 11/20/2019 08:18 AM   VD25OH 72.36 08/23/2018 08:38 AM    Clinical ASCVD: No  The 10-year ASCVD risk score Mikey Bussing DC Jr., et al., 2013) is: 8.6%   Values used to calculate the score:     Age: 89 years     Sex:  Female     Is Non-Hispanic African American: No     Diabetic: No     Tobacco smoker: No     Systolic Blood Pressure: 568 mmHg     Is BP treated: Yes     HDL Cholesterol: 60.3 mg/dL     Total Cholesterol: 181 mg/dL    Depression screen [facility] 2/9 11/22/2019 07/17/2019 08/26/2018  Decreased Interest 0 0 0  Down, Depressed, Hopeless 0 0 0  PHQ - 2 Score 0 0 0  Altered sleeping 0 0 -  Tired, decreased energy 0 0 -  Change in appetite 0 0 -  Feeling bad or failure about yourself  0 0 -  Trouble concentrating 0 0 -  Moving slowly or fidgety/restless 0 0 -  Suicidal thoughts 0 0 -  PHQ-9 Score 0 0 -  Difficult doing work/chores Not difficult at all Not difficult at all -  Some recent data might be hidden     Social History   Tobacco Use  Smoking Status Former Smoker  . Packs/day: 0.33  . Years: 34.00  . Pack years: 11.22  . Types: Cigarettes  . Quit date: 10/24/2017  . Years since quitting: 2.5  Smokeless Tobacco Never Used  Tobacco Comment   quit smoking october 2019   BP Readings from Last 3 Encounters:  03/20/20 128/72  03/07/20 (!) 159/89  02/26/20 (!) 143/75   Pulse Readings from Last 3 Encounters:  03/20/20 90  03/07/20 90  02/26/20 (!) 104   Wt Readings from Last 3 Encounters:  03/20/20 182 lb 6 oz (82.7 kg)  03/07/20 188 lb 6.4 oz (85.5 kg)  02/26/20 185 lb (83.9 kg)   BMI Readings from Last 3 Encounters:  03/20/20 29.44 kg/m  03/07/20 30.41 kg/m  02/26/20 29.86 kg/m    Assessment/Interventions: Review of patient past medical history, allergies, medications, health status, including review of consultants reports, laboratory and other test data, was performed as part of comprehensive evaluation and provision of chronic care management services.   SDOH:  (Social Determinants of Health) assessments and interventions performed: No  SDOH Screenings   Alcohol Screen: Low Risk   . Last Alcohol Screening Score (AUDIT): 1  Depression (PHQ2-9): Low Risk   .  PHQ-2 Score: 0  Financial Resource Strain: Low Risk   . Difficulty of Paying Living Expenses: Not hard at all  Food Insecurity: No Food Insecurity  . Worried About Charity fundraiser in the Last Year: Never true  . Ran Out of Food in the Last Year: Never true  Housing: Low Risk   . Last Housing Risk Score: 0  Physical Activity: Inactive  . Days of Exercise per Week: 0 days  . Minutes of Exercise per Session: 0 min  Social Connections: Not on file  Stress: No Stress Concern Present  . Feeling of Stress :  Not at all  Tobacco Use: Medium Risk  . Smoking Tobacco Use: Former Smoker  . Smokeless Tobacco Use: Never Used  Transportation Needs: No Transportation Needs  . Lack of Transportation (Medical): No  . Lack of Transportation (Non-Medical): No    CCM Care Plan  Allergies  Allergen Reactions  . Sulfonamide Derivatives     As a child.  Throat closed, rash.  . Alendronate Sodium     GI upset  . Dilaudid [Hydromorphone Hcl] Nausea And Vomiting    Medications Reviewed Today    Reviewed by Elie Confer, CMA (Certified Medical Assistant) on 03/20/20 at 1333  Med List Status: <None>  Medication Order Taking? Sig Documenting Provider Last Dose Status Informant  acetaminophen (TYLENOL) 500 MG tablet [id number] Yes Take 1,000 mg by mouth every 6 (six) hours as needed for moderate pain or headache. [provider] Taking Active Self  albuterol (VENTOLIN HFA) 108 (90 Base) MCG/ACT inhaler [id number] Yes Inhale 2 puffs into the lungs every 6 (six) hours as needed for wheezing or shortness of breath. Okay to dispense proair/Ventolin/albuterol. Garner Nash, DO Taking Active   ALPRAZolam Duanne Moron) 0.5 MG tablet [id number] Yes Take 0.5-1 tablets (0.25-0.5 mg total) by mouth 2 (two) times daily as needed. for anxiety [name], MD Taking Active   Budeson-Glycopyrrol-Formoterol [facility] AEROSPHERE) 160-9-4.8 MCG/ACT Hollie Salk [id number] Yes Inhale 2 puffs into the lungs every 12  (twelve) hours. Lauraine Rinne, NP Taking Active   buPROPion (WELLBUTRIN XL) 300 MG 24 hr tablet [id number] Yes Take 1 tablet (300 mg total) by mouth daily. [name], MD Taking Active   Calcium 500 MG CHEW [id number] Yes Chew 500 mg by mouth 2 (two) times daily.  [provider] Taking Active Self  Cholecalciferol (DIALYVITE VITAMIN D 5000) 125 MCG (5000 UT) capsule [id number] Yes 1 tab a day except for 2 on Sundays [name], MD Taking Active   Homeopathic Products ([place]) [id number] Yes Apply topically. Applying prn to bicep muscle since shoulder break. [provider] Taking Active   levothyroxine (SYNTHROID) 125 MCG tablet [id number] Yes TAKE 1 TABLET BY MOUTH  DAILY [name], MD Taking Active   metoprolol succinate (TOPROL-XL) 25 MG 24 hr tablet [id number] Yes TAKE 1 TABLET BY MOUTH  DAILY [name], MD Taking Active   Polyethyl Glycol-Propyl Glycol [facility] OP) [id number] Yes Place 1 drop into both eyes daily as needed (dryness).  [provider] Taking Active Self  Spacer/Aero-Holding Chambers (AEROCHAMBER MV) inhaler [id number] Yes Use as instructed [name], NP Taking Active Self  tiZANidine (ZANAFLEX) 4 MG tablet [id number] Yes Take 0.5-1 tablets (2-4 mg total) by mouth every 6 (six) hours as needed for muscle spasms. [name], MD Taking Active   valsartan (DIOVAN) 40 MG tablet [id number] Yes Take 1 tablet (40 mg total) by mouth daily. [name], MD Taking Active   vitamin C (ASCORBIC ACID) 250 MG tablet [id number] Yes Take 500 mg by mouth daily. [provider] Taking Active Self          Patient Active Problem List   Diagnosis Date Noted  . Healthcare maintenance 11/26/2019  . Medication management 08/25/2019  . Macromastia 05/31/2019  . Allergic rhinitis 04/06/2019  . Pain in left ankle and joints of left foot 03/30/2019  . Medicare welcome exam 08/29/2018  . Aortic atherosclerosis ([place])  12/17/2017  . Pulmonary nodule 12/17/2017  . Muscle spasm 12/13/2017  . GERD (gastroesophageal reflux  disease) 12/12/2017  . CAP (community acquired pneumonia) 11/24/2017  . Left-sided chest wall pain 11/24/2017  . CKD (chronic kidney disease), stage III ([place]) 10/28/2017  . Dizziness 10/27/2017  . Headache 10/27/2017  . Creatinine elevation 08/26/2017  . SOB (shortness of breath) 08/26/2017  . Tachycardia 06/29/2017  . HTN (hypertension) 06/23/2017  . Polymyalgia rheumatica ([place]) 05/22/2016  . History of bilateral hip replacements 05/22/2016  . DDD (degenerative disc disease), lumbar 05/22/2016  . BPV (benign positional vertigo) 05/20/2016  . Colon cancer screening 08/20/2015  . Advance care planning 08/14/2014  . Vitamin D deficiency 08/14/2014  . COPD (chronic obstructive pulmonary disease) ([place]) 06/04/2014  . Avascular necrosis of bone of right hip ([place]) 10/06/2012  . Routine general medical examination at a health care facility 04/07/2011  . Osteopenia 06/09/2010  . AVN (avascular necrosis of bone) ([place]) 05/16/2010  . Asthma 03/23/2010  . Osteoarthritis 03/23/2010  . Hypothyroidism 03/21/2010  . ANXIETY DEPRESSION 03/21/2010  . Former smoker 03/21/2010  . SKIN LESION 03/21/2010  . TEMPORAL ARTERITIS 03/21/2010    Immunization History  Administered Date(s) Administered  . Fluad Quad(high Dose 65+) 08/26/2018, 11/24/2019  . Influenza Split 10/15/2010  . Influenza,inj,Quad PF,6+ Mos 10/05/2012, 10/24/2013, 10/12/2014, 11/12/2015, 10/16/2016, 10/14/2017  . Influenza-Unspecified 08/26/2018  . PFIZER(Purple Top)SARS-COV-2 Vaccination 03/05/2019, 04/04/2019, 10/27/2019  . Pneumococcal Conjugate-13 10/26/2018  . Pneumococcal Polysaccharide-23 01/05/2009, 11/24/2019  . Tdap 04/06/2011    Conditions to be addressed/monitored:  Hypertension and COPD  Hypertension (BP goal <130/80) -Current treatment: . Metoprolol succinate 25 mg - 1 tablet daily . Valsartan 40 mg - 1  tablet daily Patient has failed these meds in the past: amlodipine   COPD (Goal: control symptoms and prevent exacerbations) -Current treatment  . Breztri (PAP) - 2 puffs every 12 hours  . Albuterol - 2 puffs every 6 hours as needed . Azithromycin 250 mg - 1 tablet on Mon, Wed, and Friday -Medications previously tried: Advair, Spiriva, Allegra  Plan: -CMA call for BP log and COPD disease state review. -Reschedule CCM appointment for May 2022.  Patient's preferred pharmacy is:  [facility], [address], [address] [place], [address] [place] [address] Phone: [phone] Fax: [place] #[id number] [place], [place] [address] [place] [address] Phone: [phone] Fax: [phone]   Debbora Dus, PharmD Clinical Pharmacist [facility] at [facility] [phone]

## 2020-04-29 ENCOUNTER — Telehealth: Payer: Self-pay

## 2020-04-29 NOTE — Chronic Care Management (AMB) (Addendum)
Chronic Care Management Pharmacy Assistant   Name: April [name]  MRN: [id number] DOB: 01/03/1956  Reason for Encounter: Disease State COPD and Hypertention   Recent office visits:  11/24/19 - PCP - Increase Vitamin D3 5000 IU daily with one extra pill on Sundays. Doing well on current dose levothyroxine.   Recent consult visits:  03/20/20 - Pulmonology - Follow-up regarding COPD after initiation of azithromycin Monday Wednesday Friday.  Patient doing much better from a respiratory standpoint.  She does feel like she has improved.  She is using her albuterol less frequently. Still using it approximately 3-5 times per week.  Breztri daily, refills as needed from drug company. Albuterol as needed  Azithromycin MWF continued and new refill  03/07/20 - Rheumatology - Temporal arteritis - denies any headaches, osteopenia - DEXA 11/20, T score -2.1 taken off Boniva, continue vitamin D supplement. Polymyalgia rheumatica - in remission. No med changes, RTC 10 months 02/07/20 - Cardiology - Establish care, Abnormal echo, with reduced EF consistent with nonischemic cardiomyopathy, NYHA class I-II. Continue metoprolol succinate 25 mg daily, valsartan 40 mg daily. Limited by low blood pressure. Aortic atherosclerosis: incidental on CT scan, continue to discuss aspirin, statin. RTC 1 year or sooner as needed.   Hospital visits:  None in previous 6 months  Medications: Outpatient Encounter Medications as of 04/29/2020  Medication Sig   acetaminophen (TYLENOL) 500 MG tablet Take 1,000 mg by mouth every 6 (six) hours as needed for moderate pain or headache.   albuterol (VENTOLIN HFA) 108 (90 Base) MCG/ACT inhaler Inhale 2 puffs into the lungs every 6 (six) hours as needed for wheezing or shortness of breath. Okay to dispense proair/Ventolin/albuterol.   ALPRAZolam (XANAX) 0.5 MG tablet Take 0.5-1 tablets (0.25-0.5 mg total) by mouth 2 (two) times daily as needed. for anxiety   azithromycin  (ZITHROMAX) 250 MG tablet Take 1 tablet (250 mg total) by mouth every Monday, Wednesday, and Friday.   Budeson-Glycopyrrol-Formoterol (BREZTRI AEROSPHERE) 160-9-4.8 MCG/ACT AERO Inhale 2 puffs into the lungs every 12 (twelve) hours.   buPROPion (WELLBUTRIN XL) 300 MG 24 hr tablet Take 1 tablet (300 mg total) by mouth daily.   Calcium 500 MG CHEW Chew 500 mg by mouth 2 (two) times daily.    Cholecalciferol (DIALYVITE VITAMIN D 5000) 125 MCG (5000 UT) capsule 1 tab a day except for 2 on Sundays   Homeopathic Products ([place]) Apply topically. Applying prn to bicep muscle since shoulder break.   levothyroxine (SYNTHROID) 125 MCG tablet TAKE 1 TABLET BY MOUTH  DAILY   metoprolol succinate (TOPROL-XL) 25 MG 24 hr tablet TAKE 1 TABLET BY MOUTH  DAILY   Polyethyl Glycol-Propyl Glycol (SYSTANE OP) Place 1 drop into both eyes daily as needed (dryness).    Spacer/Aero-Holding Chambers (AEROCHAMBER MV) inhaler Use as instructed   tiZANidine (ZANAFLEX) 4 MG tablet Take 0.5-1 tablets (2-4 mg total) by mouth every 6 (six) hours as needed for muscle spasms.   valsartan (DIOVAN) 40 MG tablet Take 1 tablet (40 mg total) by mouth daily.   vitamin C (ASCORBIC ACID) 250 MG tablet Take 500 mg by mouth daily.   No facility-administered encounter medications on file as of 04/29/2020.    Recent Office Vitals: BP Readings from Last 3 Encounters:  03/20/20 128/72  03/07/20 (!) 159/89  02/26/20 (!) 143/75   Pulse Readings from Last 3 Encounters:  03/20/20 90  03/07/20 90  02/26/20 (!) 104    Wt Readings from Last 3 Encounters:  03/20/20 182 lb 6 oz (82.7 kg)  03/07/20 188 lb 6.4 oz (85.5 kg)  02/26/20 185 lb (83.9 kg)     Kidney Function Lab Results  Component Value Date/Time   CREATININE 1.29 (H) 08/25/2019 08:15 AM   CREATININE 1.30 (H) 06/13/2019 09:55 AM   CREATININE 1.13 (H) 06/03/2016 12:13 PM   GFR 41.29 (L) 08/25/2019 08:15 AM   GFRNONAA 43 (L) 06/13/2019 09:55 AM   GFRNONAA 52 (L)  06/03/2016 12:13 PM   GFRAA 50 (L) 06/13/2019 09:55 AM   GFRAA 60 06/03/2016 12:13 PM    BMP Latest Ref Rng & Units 08/25/2019 06/13/2019 02/03/2019  Glucose 70 - 99 mg/dL 104(H) 98 145(H)  BUN 6 - 23 mg/dL 19 24(H) 15  Creatinine 0.40 - 1.20 mg/dL 1.29(H) 1.30(H) 1.10(H)  BUN/Creat Ratio 12 - 28 - - 14  Sodium 135 - 145 mEq/L 140 140 140  Potassium 3.5 - 5.1 mEq/L 4.3 4.1 4.5  Chloride 96 - 112 mEq/L 104 106 104  CO2 19 - 32 mEq/L 28 22 22   Calcium 8.4 - 10.5 mg/dL 9.8 9.4 8.8   Hypertension  Current antihypertensive regimen:  Metoprolol succinate 25 mg- 1 tablet daily Valsartan 40 mg- 1 tablet daily  Patient verbally confirms she is taking the above medications as directed. Yes  How often are you checking your Blood Pressure? weekly  she checks her blood pressure in the morning before taking her medication.  Current home BP readings:   DATE:             BP               PULSE  -  138/82  -  -  143/84  -   Wrist or arm cuff: arm Caffeine intake: Limits Salt intake: Limits  What recent interventions/DTPs have been made by any provider to improve Blood Pressure control since last CPP Visit: No recent changes or interventions  Any recent hospitalizations or ED visits since last visit with CPP? No  What diet changes have been made to improve Blood Pressure Control?  No recent changes  What exercise is being done to improve your Blood Pressure Control?  States she is active  Adherence Review: Is the patient currently on ACE/ARB medication? Yes Does the patient have >5 day gap between last estimated fill dates? No gaps in adherence   Star Rating Drugs Medication:  Fill Date: Day Supply Valsartan 40 mg 12/29/19 90  COPD  Current COPD regimen:  Breztri (PAP) - 2 puffs every 12 hours  Albuterol - 2 puffs every 6 hours as needed Azithromycin 250 mg - 1 tablet on Mon, Wed, and Friday Albuterol inhaler 2 puffs q6 hours prn wheezing/shortness of breath   Any recent  hospitalizations or ED visits since last visit with CPP? No  denies COPD symptoms, including:  Increased shortness of breath , Rescue medicine is not helping, Shortness of breath at rest, Symptoms worse with exercise, Symptoms worse at night and Wheezing   What recent interventions/DTPs have been made by any provider to improve breathing since last visit: started on azithromycin Monday Wednesday Friday   Have you had exacerbation/flare-up since last visit? No  What do you do when you are short of breath?  Rest  Current tobacco use? No  Respiratory Devices/Equipment Do you have a nebulizer? Yes Do you use a Peak Flow Meter? No Do you use a maintenance inhaler? Yes How often do you forget to use your daily inhaler? Rarely Do  you use a rescue inhaler? Yes How often do you use your rescue inhaler?  1-2x per week Do you use a spacer with your inhaler? No   Follow-Up:  Pharmacist Review  Debbora Dus, CPP notified  Margaretmary Dys, [place] Assistant [phone]  I have reviewed the care management and care coordination activities outlined in this encounter and I am certifying that I agree with the content of this note. No further action required.  Debbora Dus, PharmD Clinical Pharmacist [facility] at [facility] [phone]

## 2020-04-30 DIAGNOSIS — M25512 Pain in left shoulder: Secondary | ICD-10-CM | POA: Diagnosis not present

## 2020-04-30 DIAGNOSIS — M87029 Idiopathic aseptic necrosis of unspecified humerus: Secondary | ICD-10-CM | POA: Diagnosis not present

## 2020-04-30 DIAGNOSIS — S42252P Displaced fracture of greater tuberosity of left humerus, subsequent encounter for fracture with malunion: Secondary | ICD-10-CM | POA: Diagnosis not present

## 2020-05-01 ENCOUNTER — Other Ambulatory Visit: Payer: Self-pay | Admitting: Orthopedic Surgery

## 2020-05-01 DIAGNOSIS — S42252P Displaced fracture of greater tuberosity of left humerus, subsequent encounter for fracture with malunion: Secondary | ICD-10-CM

## 2020-05-06 ENCOUNTER — Telehealth: Payer: Self-pay | Admitting: Pulmonary Disease

## 2020-05-09 NOTE — Telephone Encounter (Signed)
  1) RISK FOR PROLONGED MECHANICAL VENTILAION - > 48h  1A) Arozullah - Prolonged mech ventilation risk Arozullah Postperative Pulmonary Risk Score - for mech ventilation dependence >48h [employer], Ann Surg 2000, major non-cardiac surgery) Comment Score  Type of surgery - abd ao aneurysm (27), thoracic (21), neurosurgery / upper abdominal / vascular (21), neck (11) 0 0  Emergency Surgery - (11) 0 0  ALbumin < 3 or poor nutritional state - (9) 0 0  BUN > 30 -  (8) 0 0  Partial or completely dependent functional status - (7) 0 0  COPD -  (6) mild 6  Age - 60 to 32 (4), > 70  (6)  4  TOTAL    Risk Stratifcation scores  - < 10 (0.5%), 11-19 (1.8%), 20-27 (4.2%), 28-40 (10.1%), >40 (26.6%)  Low Risk      1B) GUPTA - Prolonged Mech Vent Risk Score source Risk  Guptal post op prolonged mech ventilation > 48h or reintubation < 30 days - ACS 2007-2008 dataset - http://lewis-perez.info/  0.3%, low risk     2) RISK FOR POST OP PNEUMONIA Score source Risk  [facility] Safe - Post Op Pnemounia risk  [email or web address] 0.1% low risk      Based on the risk stratification above.  I believe she is low risk for perioperative pulmonary complications.  She should be fine with having a orthopedic shoulder surgery.  Garner Nash, DO [facility] 05/09/2020 6:03 PM

## 2020-05-10 NOTE — Telephone Encounter (Signed)
Faxed risk assessment and last ov to [employer] at [phone]

## 2020-05-13 ENCOUNTER — Other Ambulatory Visit: Payer: Self-pay

## 2020-05-13 ENCOUNTER — Ambulatory Visit
Admission: RE | Admit: 2020-05-13 | Discharge: 2020-05-13 | Disposition: A | Discharge status: Home / Self Care | Payer: Medicare Other | Source: Ambulatory Visit | Attending: Orthopedic Surgery | Admitting: Orthopedic Surgery

## 2020-05-13 DIAGNOSIS — M25412 Effusion, left shoulder: Secondary | ICD-10-CM | POA: Diagnosis not present

## 2020-05-13 DIAGNOSIS — M19012 Primary osteoarthritis, left shoulder: Secondary | ICD-10-CM | POA: Diagnosis not present

## 2020-05-13 DIAGNOSIS — S42252P Displaced fracture of greater tuberosity of left humerus, subsequent encounter for fracture with malunion: Secondary | ICD-10-CM

## 2020-05-13 DIAGNOSIS — S42202A Unspecified fracture of upper end of left humerus, initial encounter for closed fracture: Secondary | ICD-10-CM | POA: Diagnosis not present

## 2020-05-23 NOTE — Progress Notes (Signed)
Surgical Instructions    Your procedure is scheduled on 05/28/20.  Report to [facility] Main Entrance "A" at 12:00 A.M., then check in with the Admitting office.  Call this number if you have problems the morning of surgery:  [phone]   If you have any questions prior to your surgery date call [phone]: Open Monday-Friday 8am-4pm    Remember:  Do not eat after midnight the night before your surgery  You may drink clear liquids until 11:00 the morning of your surgery.   Clear liquids allowed are: Water, Non-Citrus Juices (without pulp), Carbonated Beverages, Clear Tea, Black Coffee Only, and Gatorade  Please complete your PRE-SURGERY ENSURE that was provided to you by 11:00 the morning of surgery.  Please, if able, drink it in one setting. DO NOT SIP.    Take these medicines the morning of surgery with A SIP OF WATER: Budeson-Glycopyrrol-Formoterol (BREZTRI AEROSPHERE)  buPROPion (WELLBUTRIN XL)  levothyroxine (SYNTHROID) metoprolol succinate (TOPROL-XL) Polyethyl Glycol-Propyl Glycol (SYSTANE OP)  As Needed: acetaminophen (TYLENOL) albuterol (VENTOLIN HFA)  ALPRAZolam (XANAX) fluticasone (FLONASE)  meclizine (ANTIVERT)  tiZANidine (ZANAFLEX)   As of today, STOP taking any Aspirin (unless otherwise instructed by your surgeon) Aleve, Naproxen, Ibuprofen, Motrin, Advil, Goody's, BC's, all herbal medications, fish oil, and all vitamins.                     Do not wear jewelry, make up, or nail polish            Do not wear lotions, powders, perfumes or deodorant.            Do not shave 48 hours prior to surgery.              Do not bring valuables to the hospital.            [facility] is not responsible for any belongings or valuables.  Do NOT Smoke (Tobacco/Vaping) or drink Alcohol 24 hours prior to your procedure If you use a CPAP at night, you may bring all equipment for your overnight stay.   Contacts, glasses, dentures or bridgework may not be worn into  surgery, please bring cases for these belongings   For patients admitted to the hospital, discharge time will be determined by your treatment team.   Patients discharged the day of surgery will not be allowed to drive home, and someone needs to stay with them for 24 hours.    Special instructions:    Oral Hygiene is also important to reduce your risk of infection.  Remember - BRUSH YOUR TEETH THE MORNING OF SURGERY WITH YOUR REGULAR TOOTHPASTE                                   - Preparing for Total Shoulder Arthroplasty   Before surgery, you can play an important role. Because skin is not sterile, your skin needs to be as free of germs as possible. You can reduce the number of germs on your skin by using the following products. . Benzoyl Peroxide Gel o Reduces the number of germs present on the skin o Applied twice a day to shoulder area starting two days before surgery   . Chlorhexidine Gluconate (CHG) Soap o An antiseptic cleaner that kills germs and bonds with the skin to continue killing germs even after washing o Used for showering the night before surgery and morning of surgery    ==================================================================  Please follow these instructions carefully:  BENZOYL PEROXIDE 5% GEL  Please do not use if you have an allergy to benzoyl peroxide.   If your skin becomes reddened/irritated stop using the benzoyl peroxide.  Starting two days before surgery, apply as follows: 1. Apply benzoyl peroxide in the morning and at night. Apply after taking a shower. If you are not taking a shower clean entire shoulder front, back, and side along with the armpit with a clean wet washcloth.  2. Place a quarter-sized dollop on your shoulder and rub in thoroughly, making sure to cover the front, back, and side of your shoulder, along with the armpit.   2 days before ____ AM   ____ PM              1 day before ____ AM   ____ PM                             3.  Do this twice a day for two days.  (Last application is the night before surgery, AFTER using the CHG soap as described below).  4. Do NOT apply benzoyl peroxide gel on the day of surgery.  CHLORHEXIDINE GLUCONATE (CHG) SOAP  Please do not use if you have an allergy to CHG or antibacterial soaps. If your skin becomes reddened/irritated stop using the CHG.   Do not shave (including legs and underarms) for at least 48 hours prior to first CHG shower. It is OK to shave your face.  Starting the night before surgery, use CHG soap as follows:  1. Shower the NIGHT BEFORE SURGERY and MORNING OF SURGERY with CHG.  2. If you choose to wash your hair, wash your hair first as usual with your normal shampoo.  3. After shampooing, rinse your hair and body thoroughly to remove the shampoo.  4. Use CHG as you would any other liquid soap.  You can apply CHG directly to the skin and wash gently with a scrungie or a clean washcloth.  5. Apply the CHG soap to your body ONLY FROM THE NECK DOWN.  Do not use on open wounds or open sores.  Avoid contact with your eyes, ears, mouth, and genitals (private parts).  Wash face and genitals (private parts) with your normal soap.  6. Wash thoroughly, paying special attention to the area where your surgery will be performed.  7. Thoroughly rinse your body with warm water from the neck down.  8. DO NOT shower/wash with your normal soap after using and rinsing off the CHG soap.   9. Pat yourself dry with a CLEAN TOWEL.   10.  Apply benzoyl peroxide.   11. Wear CLEAN PAJAMAS to bed the night before surgery; wear comfortable clothes the morning of surgery.  12. Place CLEAN SHEETS on your bed the night of your first shower and DO NOT SLEEP WITH PETS.  Day of Surgery: Shower as above Do not apply any deodorants/lotions.  Please wear clean clothes to the hospital/surgery center.   Remember to brush your teeth WITH YOUR REGULAR TOOTHPASTE.        [place]- Preparing For Surgery  Before surgery, you can play an important role. Because skin is not sterile, your skin needs to be as free of germs as possible. You can reduce the number of germs on your skin by washing with CHG (chlorahexidine gluconate) Soap before surgery.  CHG is an antiseptic cleaner which kills germs and  bonds with the skin to continue killing germs even after washing.     Please do not use if you have an allergy to CHG or antibacterial soaps. If your skin becomes reddened/irritated stop using the CHG.  Do not shave (including legs and underarms) for at least 48 hours prior to first CHG shower. It is OK to shave your face.  Please follow these instructions carefully.    1.  Shower the NIGHT BEFORE SURGERY and the MORNING OF SURGERY with CHG Soap.   If you chose to wash your hair, wash your hair first as usual with your normal shampoo. After you shampoo, rinse your hair and body thoroughly to remove the shampoo.  Then [employer] and genitals (private parts) with your normal soap and rinse thoroughly to remove soap.  2. After that Use CHG Soap as you would any other liquid soap. You can apply CHG directly to the skin and wash gently with a scrungie or a clean washcloth.   3. Apply the CHG Soap to your body ONLY FROM THE NECK DOWN.  Do not use on open wounds or open sores. Avoid contact with your eyes, ears, mouth and genitals (private parts). Wash Face and genitals (private parts)  with your normal soap.   4. Wash thoroughly, paying special attention to the area where your surgery will be performed.  5. Thoroughly rinse your body with warm water from the neck down.  6. DO NOT shower/wash with your normal soap after using and rinsing off the CHG Soap.  7. Pat yourself dry with a CLEAN TOWEL.  8. Wear CLEAN PAJAMAS to bed the night before surgery  9. Place CLEAN SHEETS on your bed the night before your surgery  10. DO NOT SLEEP WITH PETS.   Day of  Surgery: Take a shower with CHG soap. Wear Clean/Comfortable clothing the morning of surgery Do not apply any deodorants/lotions.   Remember to brush your teeth WITH YOUR REGULAR TOOTHPASTE.   Please read over the following fact sheets that you were given.

## 2020-05-24 ENCOUNTER — Encounter (HOSPITAL_COMMUNITY): Payer: Self-pay

## 2020-05-24 ENCOUNTER — Other Ambulatory Visit: Payer: Self-pay

## 2020-05-24 ENCOUNTER — Encounter (HOSPITAL_COMMUNITY)
Admission: RE | Admit: 2020-05-24 | Discharge: 2020-05-24 | Disposition: A | Discharge status: Home / Self Care | Payer: Medicare Other | Source: Ambulatory Visit | Attending: Orthopedic Surgery | Admitting: Orthopedic Surgery

## 2020-05-24 DIAGNOSIS — J449 Chronic obstructive pulmonary disease, unspecified: Secondary | ICD-10-CM | POA: Insufficient documentation

## 2020-05-24 DIAGNOSIS — M353 Polymyalgia rheumatica: Secondary | ICD-10-CM | POA: Diagnosis not present

## 2020-05-24 DIAGNOSIS — R002 Palpitations: Secondary | ICD-10-CM | POA: Diagnosis not present

## 2020-05-24 DIAGNOSIS — Z87891 Personal history of nicotine dependence: Secondary | ICD-10-CM | POA: Insufficient documentation

## 2020-05-24 DIAGNOSIS — Z01812 Encounter for preprocedural laboratory examination: Secondary | ICD-10-CM | POA: Insufficient documentation

## 2020-05-24 DIAGNOSIS — Z79899 Other long term (current) drug therapy: Secondary | ICD-10-CM | POA: Diagnosis not present

## 2020-05-24 DIAGNOSIS — I10 Essential (primary) hypertension: Secondary | ICD-10-CM | POA: Insufficient documentation

## 2020-05-24 DIAGNOSIS — I428 Other cardiomyopathies: Secondary | ICD-10-CM | POA: Diagnosis not present

## 2020-05-24 DIAGNOSIS — I5022 Chronic systolic (congestive) heart failure: Secondary | ICD-10-CM | POA: Insufficient documentation

## 2020-05-24 DIAGNOSIS — Z79890 Hormone replacement therapy (postmenopausal): Secondary | ICD-10-CM | POA: Diagnosis not present

## 2020-05-24 DIAGNOSIS — K219 Gastro-esophageal reflux disease without esophagitis: Secondary | ICD-10-CM | POA: Diagnosis not present

## 2020-05-24 DIAGNOSIS — Z96643 Presence of artificial hip joint, bilateral: Secondary | ICD-10-CM | POA: Insufficient documentation

## 2020-05-24 DIAGNOSIS — Z7951 Long term (current) use of inhaled steroids: Secondary | ICD-10-CM | POA: Diagnosis not present

## 2020-05-24 DIAGNOSIS — Z20822 Contact with and (suspected) exposure to covid-19: Secondary | ICD-10-CM | POA: Insufficient documentation

## 2020-05-24 DIAGNOSIS — M19012 Primary osteoarthritis, left shoulder: Secondary | ICD-10-CM | POA: Diagnosis not present

## 2020-05-24 DIAGNOSIS — E039 Hypothyroidism, unspecified: Secondary | ICD-10-CM | POA: Diagnosis not present

## 2020-05-24 LAB — BASIC METABOLIC PANEL
Anion gap: 10 (ref 5–15)
BUN: 18 mg/dL (ref 8–23)
CO2: 23 mmol/L (ref 22–32)
Calcium: 9.3 mg/dL (ref 8.9–10.3)
Chloride: 102 mmol/L (ref 98–111)
Creatinine, Ser: 1.42 mg/dL — ABNORMAL HIGH (ref 0.44–1.00)
GFR, Estimated: 41 mL/min — ABNORMAL LOW (ref >60)
Glucose, Bld: 141 mg/dL — ABNORMAL HIGH (ref 70–99)
Potassium: 3.9 mmol/L (ref 3.5–5.1)
Sodium: 135 mmol/L (ref 135–145)

## 2020-05-24 LAB — CBC
HCT: 40.5 % (ref 36.0–46.0)
Hemoglobin: 13.1 g/dL (ref 12.0–15.0)
MCH: 31.3 pg (ref 26.0–34.0)
MCHC: 32.3 g/dL (ref 30.0–36.0)
MCV: 96.7 fL (ref 80.0–100.0)
Platelets: 203 10*3/uL (ref 150–400)
RBC: 4.19 MIL/uL (ref 3.87–5.11)
RDW: 13.7 % (ref 11.5–15.5)
WBC: 6.7 10*3/uL (ref 4.0–10.5)
nRBC: 0 % (ref 0.0–0.2)

## 2020-05-24 LAB — SURGICAL PCR SCREEN
MRSA, PCR: NEGATIVE
Staphylococcus aureus: NEGATIVE

## 2020-05-24 LAB — SARS CORONAVIRUS 2 (TAT 6-24 HRS): SARS Coronavirus 2: NEGATIVE

## 2020-05-24 NOTE — Progress Notes (Signed)
PCP: Elsie Stain, MD Cardiologist: Buford Dresser, MD  EKG: 03/20/20 CXR: 08/25/19 ECHO: 11/15/19 Stress Test: 2018?Marland Kitchen  Requested from [facility] . Cardiac Cath: 2007 -no intervention  Fasting Blood Sugar- na Checks Blood Sugar__na_ times a day  ASA/Blood Thinners: No  OSA/CPAP: No  Covid test 05/24/20 in PAT  Anesthesia Review:  Yes, CAD listed under other medical history in Epic.  Requested nuclear stress test from 2018 approx  Patient denies shortness of breath, fever, cough, and chest pain at PAT appointment.  Patient verbalized understanding of instructions provided today at the PAT appointment.  Patient asked to review instructions at home and day of surgery.

## 2020-05-27 ENCOUNTER — Encounter (HOSPITAL_COMMUNITY): Payer: Self-pay

## 2020-05-27 NOTE — Progress Notes (Signed)
Anesthesia Chart Review:  Case: [id number] Date/Time: 05/28/20 1345   Procedure: REVERSE SHOULDER ARTHROPLASTY (Left Shoulder) - 2.5 hrs   Anesthesia type: Choice   Pre-op diagnosis: Left shoulder arthritis   Location: [facility] OR ROOM 02 / [place] OR   Surgeons: Nicholes Stairs, MD      DISCUSSION: Patient is a 65 year old female scheduled for the above procedure.  History includes former smoker, COPD (on azithromycin MWF to reduce exacerbations), asthma, exertional dyspnea, SIRS (10/2017, + Legionella), HTN, non-ischemic cardiomyopathy, chronic systolic CHF, palpitations, GERD, hypothyroidism, polymyalgia rheumatica, temporal arteritis (2009, s/p prednisone), skin cancer (SCC face, 2016), osteoporosis, mammary reduction (06/13/19), THR (left 06/17/10; right 09/3012).   Preoperative pulmonology input by Dr. Valeta Harms on 05/09/20:  1) RISK FOR PROLONGED MECHANICAL VENTILAION - > 48h  1A) Arozullah - Prolonged mech ventilation risk Arozullah Postperative Pulmonary Risk Score - for mech ventilation dependence >48h [employer], [place] [facility] 2000, major non-cardiac surgery) Comment Score  Type of surgery - abd ao aneurysm (27), thoracic (21), neurosurgery / upper abdominal / vascular (21), neck (11) 0 0  Emergency Surgery - (11) 0 0  ALbumin < 3 or poor nutritional state - (9) 0 0  BUN > 30 -  (8) 0 0  Partial or completely dependent functional status - (7) 0 0  COPD -  (6) mild 6  Age - 60 to 96 (4), > 70  (6)  4  TOTAL    Risk Stratifcation scores  - < 10 (0.5%), 11-19 (1.8%), 20-27 (4.2%), 28-40 (10.1%), >40 (26.6%)  Low Risk                 1B) GUPTA - Prolonged Mech Vent Risk Score source Risk  Guptal post op prolonged mech ventilation > 48h or reintubation < 30 days - ACS 2007-2008 dataset - http://lewis-perez.info/  0.3%, low risk     2) RISK FOR POST OP PNEUMONIA Score source Risk  Lyndel Safe - Post Op Pnemounia risk   [email or web address] 0.1% low risk      Based on the risk stratification above.  I believe she is low risk for perioperative pulmonary complications.  She should be fine with having a orthopedic shoulder surgery.    Last cardiology visit with Dr. Harrell Gave was on 02/27/20. Normal coronaries in 2006. Negative stress test in 2019. Patient has opted for medical management of non-ischemic cardiomyopathy (versus additional testing to "tease apart" specific etiology). She was euvolemic on exams. Plan for as needed echo should patient exhibit any change in symptoms. For now, continue b-blocker and ARB (titration limited by low BP). EF 55-60% 11/2019 (up from 40-45% 08/2017). One year follow-up planned.   Pfizer COVID-19 vaccine #3 10/27/19. 05/24/20 presurgical COVID-19 test negative.  Anesthesia team to evaluate on the day of surgery.   VS: BP (!) 138/97   Pulse (!) 107   Temp 37 C (Oral)   Resp 18   Ht 5\' 6"  (1.676 m)   Wt 83.2 kg   SpO2 97%   BMI 29.60 kg/m    PROVIDERS: [name], MD is PCP Buford Dresser, MD is cardiologist [name], DO is pulmonologist Bo Merino, MD is rheumatologist   LABS: Labs reviewed: Acceptable for surgery. Cr 1.42, up from 1.29 on 08/25/19. (all labs ordered are listed, but only abnormal results are displayed)  Labs Reviewed  BASIC METABOLIC PANEL - Abnormal; Notable for the following components:      Result Value   Glucose, Bld 141 (*)  Creatinine, Ser 1.42 (*)    GFR, Estimated 41 (*)    All other components within normal limits  SURGICAL PCR SCREEN  SARS CORONAVIRUS 2 (TAT 6-24 HRS)  CBC    PFTs 12/07/18: FVC 1.73 (49%), post 1.99 (56%). FEV1 0.80 (29%), post 0.93 (34%). FEV1/FVC 46% (59%), post 47% (60%). DLCO unc 10.76 (49%). INTERPRETATION: Technique: Good technique. SPIROMETRY: Severe obstructive defect, there is a significant response to bronchodilators DLCO: Moderately  reduced LUNG VOLUMES: Severe airtrapping an hyperinflation present    IMAGES: CT Left Shoulder 05/13/20: IMPRESSION: 1. Healed mildly displaced fracture of the proximal humerus. 2. New avascular necrosis of the humeral head with fragmentation and subchondral collapse. 3. Progressive secondary glenohumeral osteoarthritis with several new small intra-articular bodies. 4. Increasing small glenohumeral joint effusion. 5. Emphysema (ICD10-J43.9).  CT Chest 11/13/19: IMPRESSION: 1. Stable right upper lobe pulmonary nodules, largest measuring up to 8 mm. These are unchanged compared to 12/16/2017. 2. Additional smaller previously described right upper lobe nodules have resolved. 3. Background of advanced centrilobular emphysema. 4. Interval healing of previously seen proximal left humeral fracture. - Aortic Atherosclerosis (ICD10-I70.0) and Emphysema (ICD10-J43.9).   EKG: 03/20/20: SR   CV: Echo 11/15/19: IMPRESSIONS  1. Left ventricular ejection fraction, by estimation, is 55 to 60%. The  left ventricle has normal function. The left ventricle has no regional  wall motion abnormalities. The left ventricular internal cavity size was  mildly dilated. Left ventricular  diastolic parameters were normal.  2. Right ventricular systolic function is normal. The right ventricular  size is normal.  3. The mitral valve is normal in structure. No evidence of mitral valve  regurgitation. No evidence of mitral stenosis.  4. The aortic valve is normal in structure. Aortic valve regurgitation is  not visualized. No aortic stenosis is present.  5. The inferior vena cava is normal in size with greater than 50%  respiratory variability, suggesting right atrial pressure of 3 mmHg.  - Comparison 09/01/17: EF 40-45%, diffuse LV hypokinesis, grade 1 DD, normal RV systolic function, normal PA systolic pressure   Nuclear stress test 09/07/17: Impression:  Pharmacological myocardial perfusion  imaging study with no significant  ischemia Normal wall motion, EF estimated at 58% No EKG changes concerning for ischemia at peak stress or in recovery. Low risk scan   Cardiac cath 03/11/04 Gwenlyn Found, Roderic Palau, MD):  1.  Left main normal.  2.  LAD normal.  3.  The left circumflex was dominant normal.  4.  The right coronary artery was nondominant and was normal. LEFT VENTRICULOGRAPHY:  Overall LVEF is greater than 50%  without wall motion abnormalities.  IMPRESSION:  Ms. Reffitt has essentially normal coronary arteries, normal LV  function, no evidence of dissection.  I believe her chest pain is  noncardiac, most likely reflux related.   Past Medical History:  Diagnosis Date  . Anxiety   . Anxiety and depression    related to caring for mother during terminal illness  . Arthritis   . Asthma   . AVN (avascular necrosis of bone) (HCC)    hip and wrist  . Bronchitis    hx of  . COPD (chronic obstructive pulmonary disease) ([place])   . Depression   . Endometriosis   . FH: CAD (coronary artery disease)   . GERD (gastroesophageal reflux disease)    ocassional  . History of blood transfusion    after hip repackment - broke out in hives and started itching  . History of palpitations   .  Hypertension   . Hypothyroid   . Non-ischemic cardiomyopathy ([place]) 2019  . Osteopenia    forteo through Dr. Estanislado Pandy (started 10/12)  . Osteoporosis   . PMR (polymyalgia rheumatica) (HCC)   . Pneumonia 2019  . SIRS (systemic inflammatory response syndrome) ([place]) 10/2017  . Smoker   . SOB (shortness of breath) on exertion   . Squamous cell carcinoma    facial, 2016  . Temporal arteritis (HCC)    s/p prednisone taper    Past Surgical History:  Procedure Laterality Date  . ABDOMINAL ADHESION SURGERY    . ABDOMINAL HYSTERECTOMY    . BREAST EXCISIONAL BIOPSY Left   . BREAST REDUCTION SURGERY Bilateral 06/13/2019   Procedure: MAMMARY REDUCTION  (BREAST);  Surgeon: Cindra Presume, MD;   Location: [place];  Service: Plastics;  Laterality: Bilateral;  . BREAST SURGERY Left    benign bx 1990  . CARDIAC CATHETERIZATION  2007   no PCI  . CHOLECYSTECTOMY    . COLONOSCOPY W/ POLYPECTOMY    . INCONTINENCE SURGERY     2007   . JOINT REPLACEMENT Left 2012   left hip  . REDUCTION MAMMAPLASTY     2021  . TONSILLECTOMY    . TOTAL HIP ARTHROPLASTY Right 10/04/2012   Procedure: TOTAL HIP ARTHROPLASTY;  Surgeon: Garald Balding, MD;  Location: [place];  Service: Orthopedics;  Laterality: Right;  . WRIST SURGERY Left    2012/ left wrist/ bone removed due to necrosis    MEDICATIONS: . acetaminophen (TYLENOL) 500 MG tablet  . albuterol (VENTOLIN HFA) 108 (90 Base) MCG/ACT inhaler  . ALPRAZolam (XANAX) 0.5 MG tablet  . azithromycin (ZITHROMAX) 250 MG tablet  . Budeson-Glycopyrrol-Formoterol (BREZTRI AEROSPHERE) 160-9-4.8 MCG/ACT AERO  . buPROPion (WELLBUTRIN XL) 300 MG 24 hr tablet  . Calcium 500 MG CHEW  . Cholecalciferol (DIALYVITE VITAMIN D 5000) 125 MCG (5000 UT) capsule  . diclofenac Sodium (VOLTAREN) 1 % GEL  . fluticasone (FLONASE) 50 MCG/ACT nasal spray  . Homeopathic Products ([place])  . levothyroxine (SYNTHROID) 125 MCG tablet  . meclizine (ANTIVERT) 25 MG tablet  . metoprolol succinate (TOPROL-XL) 25 MG 24 hr tablet  . Multiple Vitamins-Minerals (MULTIVITAMIN WITH MINERALS) tablet  . Polyethyl Glycol-Propyl Glycol (SYSTANE OP)  . Spacer/Aero-Holding Chambers (AEROCHAMBER MV) inhaler  . tiZANidine (ZANAFLEX) 4 MG tablet  . valsartan (DIOVAN) 40 MG tablet  . vitamin C (ASCORBIC ACID) 250 MG tablet   No current facility-administered medications for this encounter.  Takes azithromycin Q MWF for COPD management (to reduce exacerbations).    Myra Gianotti, PA-C Surgical Short Stay/Anesthesiology [facility] Phone [phone] [place] Phone [phone] 05/27/2020 11:43 AM

## 2020-05-27 NOTE — Anesthesia Preprocedure Evaluation (Addendum)
Anesthesia Evaluation  Patient identified by MRN, date of birth, ID band Patient awake    Reviewed: Allergy & Precautions, NPO status , Patient's Chart, lab work & pertinent test results, reviewed documented beta blocker date and time   History of Anesthesia Complications Negative for: history of anesthetic complications  Airway Mallampati: II  TM Distance: >3 FB Neck ROM: Full    Dental no notable dental hx.    Pulmonary asthma , COPD,  COPD inhaler, former smoker,    Pulmonary exam normal        Cardiovascular hypertension, Pt. on medications and Pt. on home beta blockers Normal cardiovascular exam     Neuro/Psych Anxiety Depression negative neurological ROS     GI/Hepatic Neg liver ROS, GERD  Controlled,  Endo/Other  Hypothyroidism   Renal/GU Renal InsufficiencyRenal disease  negative genitourinary   Musculoskeletal  (+) Arthritis ,   Abdominal   Peds  Hematology negative hematology ROS (+)   Anesthesia Other Findings Day of surgery medications reviewed with patient.  Reproductive/Obstetrics negative OB ROS                           Anesthesia Physical Anesthesia Plan  ASA: II  Anesthesia Plan: General   Post-op Pain Management: GA combined w/ Regional for post-op pain   Induction: Intravenous  PONV Risk Score and Plan: 3 and Treatment may vary due to age or medical condition, Ondansetron, Dexamethasone and Midazolam  Airway Management Planned: Oral ETT  Additional Equipment: None  Intra-op Plan:   Post-operative Plan: Extubation in OR  Informed Consent: I have reviewed the patients History and Physical, chart, labs and discussed the procedure including the risks, benefits and alternatives for the proposed anesthesia with the patient or authorized representative who has indicated his/her understanding and acceptance.     Dental advisory given  Plan Discussed with:  CRNA  Anesthesia Plan Comments: (PAT note written 05/27/2020 by Myra Gianotti, PA-C. )      Anesthesia Quick Evaluation

## 2020-05-28 ENCOUNTER — Ambulatory Visit (HOSPITAL_COMMUNITY)
Admission: RE | Admit: 2020-05-28 | Discharge: 2020-05-28 | Disposition: A | Discharge status: Home / Self Care | Payer: Medicare Other | Attending: Orthopedic Surgery | Admitting: Orthopedic Surgery

## 2020-05-28 ENCOUNTER — Ambulatory Visit (HOSPITAL_COMMUNITY): Payer: Medicare Other

## 2020-05-28 ENCOUNTER — Ambulatory Visit (HOSPITAL_COMMUNITY): Payer: Medicare Other | Admitting: Certified Registered Nurse Anesthetist

## 2020-05-28 ENCOUNTER — Other Ambulatory Visit: Payer: Self-pay

## 2020-05-28 ENCOUNTER — Encounter (HOSPITAL_COMMUNITY)
Admission: RE | Disposition: A | Discharge status: Home / Self Care | Payer: Self-pay | Source: Home / Self Care | Attending: Orthopedic Surgery

## 2020-05-28 ENCOUNTER — Encounter (HOSPITAL_COMMUNITY): Payer: Self-pay | Admitting: Orthopedic Surgery

## 2020-05-28 ENCOUNTER — Ambulatory Visit (HOSPITAL_COMMUNITY): Payer: Medicare Other | Admitting: Vascular Surgery

## 2020-05-28 DIAGNOSIS — Z79899 Other long term (current) drug therapy: Secondary | ICD-10-CM | POA: Diagnosis not present

## 2020-05-28 DIAGNOSIS — Z888 Allergy status to other drugs, medicaments and biological substances status: Secondary | ICD-10-CM | POA: Insufficient documentation

## 2020-05-28 DIAGNOSIS — Z882 Allergy status to sulfonamides status: Secondary | ICD-10-CM | POA: Diagnosis not present

## 2020-05-28 DIAGNOSIS — Z885 Allergy status to narcotic agent status: Secondary | ICD-10-CM | POA: Insufficient documentation

## 2020-05-28 DIAGNOSIS — S42212P Unspecified displaced fracture of surgical neck of left humerus, subsequent encounter for fracture with malunion: Secondary | ICD-10-CM | POA: Diagnosis not present

## 2020-05-28 DIAGNOSIS — M87212 Osteonecrosis due to previous trauma, left shoulder: Secondary | ICD-10-CM | POA: Diagnosis not present

## 2020-05-28 DIAGNOSIS — Z471 Aftercare following joint replacement surgery: Secondary | ICD-10-CM | POA: Diagnosis not present

## 2020-05-28 DIAGNOSIS — R600 Localized edema: Secondary | ICD-10-CM | POA: Diagnosis not present

## 2020-05-28 DIAGNOSIS — Z7951 Long term (current) use of inhaled steroids: Secondary | ICD-10-CM | POA: Diagnosis not present

## 2020-05-28 DIAGNOSIS — S42252P Displaced fracture of greater tuberosity of left humerus, subsequent encounter for fracture with malunion: Secondary | ICD-10-CM | POA: Diagnosis not present

## 2020-05-28 DIAGNOSIS — M19112 Post-traumatic osteoarthritis, left shoulder: Secondary | ICD-10-CM | POA: Diagnosis not present

## 2020-05-28 DIAGNOSIS — M19012 Primary osteoarthritis, left shoulder: Secondary | ICD-10-CM | POA: Diagnosis not present

## 2020-05-28 DIAGNOSIS — M87022 Idiopathic aseptic necrosis of left humerus: Secondary | ICD-10-CM | POA: Diagnosis not present

## 2020-05-28 DIAGNOSIS — I10 Essential (primary) hypertension: Secondary | ICD-10-CM | POA: Diagnosis not present

## 2020-05-28 DIAGNOSIS — Z79890 Hormone replacement therapy (postmenopausal): Secondary | ICD-10-CM | POA: Insufficient documentation

## 2020-05-28 DIAGNOSIS — J449 Chronic obstructive pulmonary disease, unspecified: Secondary | ICD-10-CM | POA: Diagnosis not present

## 2020-05-28 DIAGNOSIS — X58XXXD Exposure to other specified factors, subsequent encounter: Secondary | ICD-10-CM | POA: Insufficient documentation

## 2020-05-28 DIAGNOSIS — Z87891 Personal history of nicotine dependence: Secondary | ICD-10-CM | POA: Insufficient documentation

## 2020-05-28 DIAGNOSIS — Z96612 Presence of left artificial shoulder joint: Secondary | ICD-10-CM

## 2020-05-28 DIAGNOSIS — G8918 Other acute postprocedural pain: Secondary | ICD-10-CM | POA: Diagnosis not present

## 2020-05-28 DIAGNOSIS — Z96642 Presence of left artificial hip joint: Secondary | ICD-10-CM | POA: Diagnosis not present

## 2020-05-28 HISTORY — PX: REVERSE SHOULDER ARTHROPLASTY: SHX5054

## 2020-05-28 SURGERY — ARTHROPLASTY, SHOULDER, TOTAL, REVERSE
Anesthesia: General | Site: Shoulder | Laterality: Left

## 2020-05-28 MED ORDER — ROCURONIUM BROMIDE 10 MG/ML (PF) SYRINGE
PREFILLED_SYRINGE | INTRAVENOUS | Status: DC | PRN
  Administered 2020-05-28: 30 mg via INTRAVENOUS

## 2020-05-28 MED ORDER — LACTATED RINGERS IV SOLN: INTRAVENOUS | Status: DC

## 2020-05-28 MED ORDER — SUGAMMADEX SODIUM 200 MG/2ML IV SOLN
INTRAVENOUS | Status: DC | PRN
  Administered 2020-05-28: 200 mg via INTRAVENOUS

## 2020-05-28 MED ORDER — ONDANSETRON 4 MG PO TBDP: 4.0000 mg | ORAL_TABLET | Freq: Three times a day (TID) | ORAL | 0 refills | Status: DC | PRN

## 2020-05-28 MED ORDER — FENTANYL CITRATE (PF) 100 MCG/2ML IJ SOLN: 25.0000 ug | INTRAMUSCULAR | Status: DC | PRN

## 2020-05-28 MED ORDER — VANCOMYCIN HCL 1 G IV SOLR
INTRAVENOUS | Status: DC | PRN
  Administered 2020-05-28: 1000 mg via TOPICAL

## 2020-05-28 MED ORDER — TRANEXAMIC ACID-NACL 1000-0.7 MG/100ML-% IV SOLN
1000.0000 mg | INTRAVENOUS | Status: AC
Start: 2020-05-28 — End: 2020-05-28
  Administered 2020-05-28: 1000 mg via INTRAVENOUS
  Filled 2020-05-28: qty 100

## 2020-05-28 MED ORDER — PROPOFOL 10 MG/ML IV BOLUS
INTRAVENOUS | Status: DC | PRN
  Administered 2020-05-28: 150 mg via INTRAVENOUS

## 2020-05-28 MED ORDER — PROMETHAZINE HCL 25 MG/ML IJ SOLN: 6.2500 mg | INTRAMUSCULAR | Status: DC | PRN

## 2020-05-28 MED ORDER — FENTANYL CITRATE (PF) 100 MCG/2ML IJ SOLN
INTRAMUSCULAR | Status: AC
  Filled 2020-05-28: qty 2

## 2020-05-28 MED ORDER — ACETAMINOPHEN 10 MG/ML IV SOLN: 1000.0000 mg | Freq: Once | INTRAVENOUS | Status: DC

## 2020-05-28 MED ORDER — FENTANYL CITRATE (PF) 250 MCG/5ML IJ SOLN
INTRAMUSCULAR | Status: AC
  Filled 2020-05-28: qty 5

## 2020-05-28 MED ORDER — BUPIVACAINE-EPINEPHRINE (PF) 0.25% -1:200000 IJ SOLN: INTRAMUSCULAR | Status: DC | PRN

## 2020-05-28 MED ORDER — OXYCODONE HCL 5 MG PO TABS: 5.0000 mg | ORAL_TABLET | Freq: Once | ORAL | Status: DC | PRN

## 2020-05-28 MED ORDER — CHLORHEXIDINE GLUCONATE 0.12 % MT SOLN
15.0000 mL | Freq: Once | OROMUCOSAL | Status: AC
  Administered 2020-05-28: 15 mL via OROMUCOSAL
  Filled 2020-05-28: qty 15

## 2020-05-28 MED ORDER — BUPIVACAINE-EPINEPHRINE (PF) 0.25% -1:200000 IJ SOLN
INTRAMUSCULAR | Status: AC
  Filled 2020-05-28: qty 30

## 2020-05-28 MED ORDER — PHENYLEPHRINE HCL-NACL 10-0.9 MG/250ML-% IV SOLN
INTRAVENOUS | Status: DC | PRN
  Administered 2020-05-28: 40 ug/min via INTRAVENOUS

## 2020-05-28 MED ORDER — ACETAMINOPHEN 500 MG PO TABS
1000.0000 mg | ORAL_TABLET | Freq: Once | ORAL | Status: DC
  Filled 2020-05-28: qty 2

## 2020-05-28 MED ORDER — FENTANYL CITRATE (PF) 100 MCG/2ML IJ SOLN
INTRAMUSCULAR | Status: DC | PRN
  Administered 2020-05-28: 50 ug via INTRAVENOUS

## 2020-05-28 MED ORDER — MIDAZOLAM HCL 2 MG/2ML IJ SOLN
INTRAMUSCULAR | Status: AC
  Administered 2020-05-28: 2 mg via INTRAVENOUS
  Filled 2020-05-28: qty 2

## 2020-05-28 MED ORDER — ROPIVACAINE HCL 7.5 MG/ML IJ SOLN
INTRAMUSCULAR | Status: DC | PRN
  Administered 2020-05-28: 20 mL via PERINEURAL

## 2020-05-28 MED ORDER — MIDAZOLAM HCL 2 MG/2ML IJ SOLN
2.0000 mg | Freq: Once | INTRAMUSCULAR | Status: AC
  Filled 2020-05-28: qty 2

## 2020-05-28 MED ORDER — OXYCODONE HCL 5 MG/5ML PO SOLN: 5.0000 mg | Freq: Once | ORAL | Status: DC | PRN

## 2020-05-28 MED ORDER — ORAL CARE MOUTH RINSE: 15.0000 mL | Freq: Once | OROMUCOSAL | Status: AC

## 2020-05-28 MED ORDER — CEFAZOLIN SODIUM-DEXTROSE 2-4 GM/100ML-% IV SOLN
2.0000 g | INTRAVENOUS | Status: AC
  Administered 2020-05-28: 2 g via INTRAVENOUS
  Filled 2020-05-28: qty 100

## 2020-05-28 MED ORDER — OXYCODONE HCL 5 MG PO TABS: 5.0000 mg | ORAL_TABLET | Freq: Four times a day (QID) | ORAL | 0 refills | Status: DC | PRN

## 2020-05-28 MED ORDER — 0.9 % SODIUM CHLORIDE (POUR BTL) OPTIME
TOPICAL | Status: DC | PRN
  Administered 2020-05-28: 1000 mL

## 2020-05-28 MED ORDER — ONDANSETRON HCL 4 MG/2ML IJ SOLN
INTRAMUSCULAR | Status: DC | PRN
  Administered 2020-05-28: 4 mg via INTRAVENOUS

## 2020-05-28 MED ORDER — LIDOCAINE 2% (20 MG/ML) 5 ML SYRINGE
INTRAMUSCULAR | Status: DC | PRN
  Administered 2020-05-28: 40 mg via INTRAVENOUS

## 2020-05-28 MED ORDER — VANCOMYCIN HCL 1000 MG IV SOLR
INTRAVENOUS | Status: AC
  Filled 2020-05-28: qty 1000

## 2020-05-28 MED ORDER — DEXAMETHASONE SODIUM PHOSPHATE 10 MG/ML IJ SOLN
INTRAMUSCULAR | Status: DC | PRN
  Administered 2020-05-28: 10 mg via INTRAVENOUS

## 2020-05-28 SURGICAL SUPPLY — 69 items
BASEPLATE AUG FULL 24X10X2 LAT (Plate) ×2 IMPLANT
BIT DRILL 5/64X5 DISP (BIT) ×2 IMPLANT
BIT DRILL FLUTED 3.0 STRL (BIT) ×2 IMPLANT
BLADE SAG 18X100X1.27 (BLADE) ×2 IMPLANT
CALIBRATOR GLENOID VIP 5-D (SYSTAGENIX WOUND MANAGEMENT) ×2 IMPLANT
COVER SURGICAL LIGHT HANDLE (MISCELLANEOUS) ×2 IMPLANT
COVER WAND RF STERILE (DRAPES) IMPLANT
CUP SUT UNIV REVERS 36 NEUTRAL (Cup) ×2 IMPLANT
DRAPE IMP U-DRAPE 54X76 (DRAPES) IMPLANT
DRAPE INCISE IOBAN 66X45 STRL (DRAPES) ×2 IMPLANT
DRAPE ORTHO SPLIT 77X108 STRL (DRAPES)
DRAPE SURG 17X23 STRL (DRAPES) ×2 IMPLANT
DRAPE SURG ORHT 6 SPLT 77X108 (DRAPES) IMPLANT
DRAPE U-SHAPE 47X51 STRL (DRAPES) ×2 IMPLANT
DRESSING AQUACEL AG SP 3.5X6 (GAUZE/BANDAGES/DRESSINGS) IMPLANT
DRSG AQUACEL AG ADV 3.5X10 (GAUZE/BANDAGES/DRESSINGS) ×2 IMPLANT
DRSG AQUACEL AG SP 3.5X6 (GAUZE/BANDAGES/DRESSINGS)
DURAPREP 26ML APPLICATOR (WOUND CARE) ×2 IMPLANT
ELECT BLADE 4.0 EZ CLEAN MEGAD (MISCELLANEOUS) ×2
ELECT REM PT RETURN 9FT ADLT (ELECTROSURGICAL) ×2
ELECTRODE BLDE 4.0 EZ CLN MEGD (MISCELLANEOUS) ×1 IMPLANT
ELECTRODE REM PT RTRN 9FT ADLT (ELECTROSURGICAL) ×1 IMPLANT
FACESHIELD WRAPAROUND (MASK) ×2 IMPLANT
GLENOSPHERE 33+4 LAT/24 UNI RV (Joint) ×2 IMPLANT
GLOVE BIO SURGEON STRL SZ7.5 (GLOVE) ×8 IMPLANT
GLOVE SRG 8 PF TXTR STRL LF DI (GLOVE) ×2 IMPLANT
GLOVE SURG UNDER POLY LF SZ8 (GLOVE) ×4
GOWN STRL REUS W/ TWL LRG LVL3 (GOWN DISPOSABLE) ×1 IMPLANT
GOWN STRL REUS W/ TWL XL LVL3 (GOWN DISPOSABLE) ×2 IMPLANT
GOWN STRL REUS W/TWL LRG LVL3 (GOWN DISPOSABLE) ×2
GOWN STRL REUS W/TWL XL LVL3 (GOWN DISPOSABLE) ×4
INSERT HUMERAL 36 +3/33 COMBO (Joint) ×2 IMPLANT
KIT BASIN OR (CUSTOM PROCEDURE TRAY) ×2 IMPLANT
KIT TURNOVER KIT B (KITS) ×2 IMPLANT
MANIFOLD NEPTUNE II (INSTRUMENTS) ×2 IMPLANT
NEEDLE 1/2 CIR MAYO (NEEDLE) ×2 IMPLANT
NEEDLE HYPO 25GX1X1/2 BEV (NEEDLE) IMPLANT
NS IRRIG 1000ML POUR BTL (IV SOLUTION) ×2 IMPLANT
PACK SHOULDER (CUSTOM PROCEDURE TRAY) ×2 IMPLANT
PAD ARMBOARD 7.5X6 YLW CONV (MISCELLANEOUS) ×4 IMPLANT
PIN NITINOL TARGETER 2.8 (PIN) ×2 IMPLANT
PIN SET MODULAR GLENOID SYSTEM (PIN) ×2 IMPLANT
POST MODULAR 25 (Post) ×2 IMPLANT
POST MODULAR MGS BASEPLATE 25 (Post) ×1 IMPLANT
REAMER ANGLED HEAD SMALL (DRILL) ×2 IMPLANT
RESTRAINT HEAD UNIVERSAL NS (MISCELLANEOUS) ×2 IMPLANT
SCREW PERI LOCK 5.5X16 (Screw) ×4 IMPLANT
SCREW PERIPHERAL 4.5X24 N/L (Screw) ×2 IMPLANT
SCREW PERIPHERAL 5.5X28 LOCK (Screw) ×2 IMPLANT
SLING ARM IMMOBILIZER LRG (SOFTGOODS) ×2 IMPLANT
SLING ARM IMMOBILIZER MED (SOFTGOODS) IMPLANT
SPONGE LAP 18X18 RF (DISPOSABLE) IMPLANT
SPONGE LAP 4X18 RFD (DISPOSABLE) IMPLANT
STEM HUMERAL UNI REVERS SZ6 (Stem) ×2 IMPLANT
STRIP CLOSURE SKIN 1/2X4 (GAUZE/BANDAGES/DRESSINGS) ×2 IMPLANT
SUCTION FRAZIER HANDLE 10FR (MISCELLANEOUS) ×2
SUCTION TUBE FRAZIER 10FR DISP (MISCELLANEOUS) ×1 IMPLANT
SUT FIBERWIRE #2 38 T-5 BLUE (SUTURE) ×4
SUT MNCRL AB 4-0 PS2 18 (SUTURE) IMPLANT
SUT VIC AB 1 CT1 27 (SUTURE)
SUT VIC AB 1 CT1 27XBRD ANBCTR (SUTURE) IMPLANT
SUT VIC AB 2-0 CT1 27 (SUTURE) ×2
SUT VIC AB 2-0 CT1 TAPERPNT 27 (SUTURE) ×1 IMPLANT
SUTURE FIBERWR #2 38 T-5 BLUE (SUTURE) ×2 IMPLANT
SYR CONTROL 10ML LL (SYRINGE) IMPLANT
TOWEL GREEN STERILE (TOWEL DISPOSABLE) ×2 IMPLANT
TOWEL GREEN STERILE FF (TOWEL DISPOSABLE) ×2 IMPLANT
WATER STERILE IRR 1000ML POUR (IV SOLUTION) ×2 IMPLANT
YANKAUER SUCT BULB TIP NO VENT (SUCTIONS) ×2 IMPLANT

## 2020-05-28 NOTE — Progress Notes (Signed)
Dr. Daiva Huge made aware that patient's last dose of Metoprolol 25 mg was at 2200 on 05/27/20. No further instructions received.

## 2020-05-28 NOTE — Brief Op Note (Signed)
05/28/2020  3:50 PM  PATIENT:  April [name]  65 y.o. female  PRE-OPERATIVE DIAGNOSIS:  1.  Left shoulder proximal humerus malunion 2.  Left shoulder posttraumatic glenohumeral arthritis, with humeral head avascular necrosis  POST-OPERATIVE DIAGNOSIS: same  PROCEDURE:  Procedure(s) with comments: REVERSE SHOULDER ARTHROPLASTY (Left) - 2.5 hrs  SURGEON:  Surgeon(s) and Role:    * Evan Osburn, Elly Modena, MD - Primary  PHYSICIAN ASSISTANT: Jonelle Sidle, PA-C   ANESTHESIA:   regional and general  EBL:  200 mL   BLOOD ADMINISTERED:none  DRAINS: none   LOCAL MEDICATIONS USED:  MARCAINE     SPECIMEN:  No Specimen  DISPOSITION OF SPECIMEN:  N/A  COUNTS:  YES  TOURNIQUET:  * No tourniquets in log *  DICTATION: .Note written in EPIC  PLAN OF CARE: Discharge to home after PACU  PATIENT DISPOSITION:  PACU - hemodynamically stable.   Delay start of Pharmacological VTE agent (>24hrs) due to surgical blood loss or risk of bleeding: not applicable

## 2020-05-28 NOTE — Discharge Instructions (Signed)
Orthopedic discharge instructions:  -Maintain sling around-the-clock unless doing activities of daily living or daily range of motion exercises of the hand, elbow, and wrist.  No lifting or 1 pound with the left arm.  -Apply ice liberally throughout the day to the left shoulder at 30 minutes/h as able.  -for mild to moderate pain use Tylenol and or Advil as needed around-the-clock, as well as breakthrough pain oxycodone as directed.  -Maintain postoperative bandage until your follow-up appointment.  You may shower with this bandage in place.  Do not submerge underwater.  -Return to see Dr. Stann Mainland in 2 weeks postoperatively.

## 2020-05-28 NOTE — Anesthesia Procedure Notes (Signed)
Anesthesia Regional Block: Interscalene brachial plexus block   Pre-Anesthetic Checklist: ,, timeout performed, Correct Patient, Correct Site, Correct Laterality, Correct Procedure, Correct Position, site marked, Risks and benefits discussed,  Surgical consent,  Pre-op evaluation,  At surgeon's request and post-op pain management  Laterality: Left  Prep: chloraprep       Needles:  Injection technique: Single-shot  Needle Type: Echogenic Stimulator Needle     Needle Length: 9cm  Needle Gauge: 21     Additional Needles:   Procedures:,,,, ultrasound used (permanent image in chart),,,,  Narrative:  Start time: 05/28/2020 1:45 PM End time: 05/28/2020 1:55 PM Injection made incrementally with aspirations every 5 mL.  Performed by: Personally  Anesthesiologist: Murvin Natal, MD  Additional Notes: Functioning IV was confirmed and monitors were applied.  A timeout was performed. Sterile prep, hand hygiene and sterile gloves were used. A 34mm 21ga Arrow echogenic stimulator needle was used. Negative aspiration and negative test dose prior to incremental administration of local anesthetic. The patient tolerated the procedure well.  Ultrasound guidance: relevent anatomy identified, needle position confirmed, local anesthetic spread visualized around nerve(s), vascular puncture avoided.  Image printed for medical record.

## 2020-05-28 NOTE — Transfer of Care (Signed)
Immediate Anesthesia Transfer of Care Note  Patient: April [name]  Procedure(s) Performed: REVERSE SHOULDER ARTHROPLASTY (Left Shoulder)  Patient Location: PACU  Anesthesia Type:GA combined with regional for post-op pain  Level of Consciousness: awake, alert  and oriented  Airway & Oxygen Therapy: Patient Spontanous Breathing and Patient connected to face mask oxygen  Post-op Assessment: Report given to RN and Post -op Vital signs reviewed and stable  Post vital signs: Reviewed and stable  Last Vitals:  Vitals Value Taken Time  BP 145/86 05/28/20 1604  Temp    Pulse 88 05/28/20 1606  Resp 16 05/28/20 1606  SpO2 100 % 05/28/20 1606  Vitals shown include unvalidated device data.  Last Pain:  Vitals:   05/28/20 1224  TempSrc:   PainSc: 0-No pain      Patients Stated Pain Goal: 5 (06/77/03 4035)  Complications: No complications documented.

## 2020-05-28 NOTE — Op Note (Signed)
05/28/2020  3:55 PM  PATIENT:  April [name]    PRE-OPERATIVE DIAGNOSIS:   1.  Left shoulder posttraumatic arthrosis with avascular necrosis 2.  Left shoulder proximal humerus malunion  POST-OPERATIVE DIAGNOSIS:  Same  PROCEDURE:  REVERSE SHOULDER ARTHROPLASTY  SURGEON:  Nicholes Stairs, MD  ASSISTANT: Jonelle Sidle, PA-C  Assistant attestation: A Mcclung was present for the entire procedure.  For prepping and draping and positioning, as well as deep retractor placement and maintenance as well as implantation of final implants.  Also critical for layered closure and application of sling.  ANESTHESIA:   General  ESTIMATED BLOOD LOSS: 200 cc  PREOPERATIVE INDICATIONS:  April [name] is a  65 y.o. female with a diagnosis of Left posttraumatic shoulder arthritis who failed conservative measures and elected for surgical management.    The risks benefits and alternatives were discussed with the patient preoperatively including but not limited to the risks of infection, bleeding, nerve injury, cardiopulmonary complications, the need for revision surgery, dislocation, brachial plexus palsy, incomplete relief of pain, among others, and the patient was willing to proceed.  OPERATIVE IMPLANTS:  Arthrex size 6 reverse stem Full augment 10 degree baseplate with a 25 mm post, and 2 mm lateralized 33 mm +4 lateralized glenosphere Standard humeral tray with a +9 polyethylene A single nonlocking cortical screw with a glenoid baseplate with 3 peripheral locking screws.  OPERATIVE FINDINGS:  Significant varus collapse of the humeral head through the surgical neck fracture that had gone on to a malunion.  Moderate collapse of the head consistent with avascular necrosis.  Significant internal rotation contracture and scarring of the subscapularis.  Otherwise intact rotator cuff x4.  Biceps tendon was noted to be spontaneously ruptured at the level of the surgical  neck.  OPERATIVE PROCEDURE: The patient was brought to the operating room and placed in the supine position. General anesthesia was administered. IV antibiotics were given. A Foley was not placed. Time out was performed. The upper extremity was prepped and draped in usual sterile fashion. The patient was in a beachchair position. Deltopectoral approach was carried out.  Cephalic vein was maintained and retracted medially throughout the procedure.  The subscapularis was released off of the bone.   I then performed circumferential releases of the humerus, and then dislocated the head, and then reamed with the reamer to the above named size.  I then applied the jig, and cut the humeral head in 30 of retroversion, and then turned my attention to the glenoid.  Deep retractors were placed, and I resected the labrum, and then placed a guidepin into the center position on the glenoid, with slight inferior inclination.  This was done utilizing the VIP patient specific guide.  I then reamed over the guidepin, and this created a small metaphyseal cancellus blush inferiorly, removing just the cartilage to the subchondral bone superiorly. The base plate was selected and impacted place, with the 10 degree full augment oriented at about the 11 o'clock position.  A nonlocking screw was placed inferiorly with good bite.  We then placed 3 peripheral locking screws superiorly, anteriorly and posteriorly.    I then turned my attention to the glenosphere, and impacted this into place, placing slight inferior offset (set on B).   The glenoid sphere was completely seated, and had engagement of the [facility] taper. I then turned my attention back to the humerus.  Set screw was placed in glenosphere.  I sequentially broached, and then trialed, and was found to  restore soft tissue tension, and it had 2 finger tightness. Therefore the above named components were selected. The shoulder felt stable throughout functional motion.     I then impacted the real prosthesis into place, as well as the real humeral tray, and reduced the shoulder. The shoulder had excellent motion, and was stable, and I irrigated the wounds copiously.   The subscapularis tendon was not repaired due to internal rotation contracture of the humerus preoperatively as well as concern for overtensioning with the lateralized glenosphere.   I then irrigated the shoulder copiously once more, repaired the deltopectoral interval with # 1 Vicryl, followed by subcutaneous Vicryl, then monocryl for the skin,  with Steri-Strips and sterile gauze for the skin. The patient was awakened and returned back in stable and satisfactory condition. There no complications and they tolerated the procedure well.  All counts were correct x2.  Disposition: April [name] will be in her sling for approximately 2 weeks.  She is okay to begin immediate activities of daily living with the left arm as able.  Otherwise no lifting with the left arm.  She can do immediate hand, elbow, and wrist range of motion as tolerated.  No lifting above shoulder height left shoulder.  To be discharged home from PACU with follow-up with me in 2 weeks with wound check and two-view x-ray views of the left shoulder.

## 2020-05-28 NOTE — Anesthesia Postprocedure Evaluation (Signed)
Anesthesia Post Note  Patient: April [name]  Procedure(s) Performed: REVERSE SHOULDER ARTHROPLASTY (Left Shoulder)     Patient location during evaluation: PACU Anesthesia Type: General and Regional Level of consciousness: awake Pain management: pain level controlled Vital Signs Assessment: post-procedure vital signs reviewed and stable Respiratory status: spontaneous breathing, nonlabored ventilation, respiratory function stable and patient connected to nasal cannula oxygen Cardiovascular status: blood pressure returned to baseline and stable Postop Assessment: no apparent nausea or vomiting Anesthetic complications: no   No complications documented.  Last Vitals:  Vitals:   05/28/20 1620 05/28/20 1635  BP: (!) 150/88 (!) 143/78  Pulse: 90 90  Resp: 15 16  Temp:    SpO2: 97% 94%    Last Pain:  Vitals:   05/28/20 1635  TempSrc:   PainSc: 0-No pain                 Loreda Silverio P Malorie Bigford

## 2020-05-28 NOTE — Anesthesia Procedure Notes (Signed)
Procedure Name: Intubation Date/Time: 05/28/2020 2:14 PM Performed by: Genelle Bal, CRNA Pre-anesthesia Checklist: Patient identified, Emergency Drugs available, Suction available and Patient being monitored Patient Re-evaluated:Patient Re-evaluated prior to induction Oxygen Delivery Method: Circle system utilized Preoxygenation: Pre-oxygenation with 100% oxygen Induction Type: IV induction Ventilation: Mask ventilation without difficulty Laryngoscope Size: Miller and 2 Grade View: Grade I Tube type: Oral Tube size: 7.0 mm Number of attempts: 1 Airway Equipment and Method: Stylet and Oral airway Placement Confirmation: ETT inserted through vocal cords under direct vision,  positive ETCO2 and breath sounds checked- equal and bilateral Secured at: 21 cm Tube secured with: Tape Dental Injury: Teeth and Oropharynx as per pre-operative assessment
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
## 2020-05-30 ENCOUNTER — Encounter (HOSPITAL_COMMUNITY): Payer: Self-pay | Admitting: Orthopedic Surgery

## 2020-06-07 ENCOUNTER — Other Ambulatory Visit: Payer: Self-pay | Admitting: Family Medicine

## 2020-06-07 NOTE — Telephone Encounter (Signed)
Refill request for Alprazolam 0.5 mg tablets  LOV - 11/24/19 Next OV - not scheduled Last refill - 11/24/19 #60/2

## 2020-06-09 NOTE — Telephone Encounter (Signed)
Sent. Thanks.   

## 2020-06-11 DIAGNOSIS — M25512 Pain in left shoulder: Secondary | ICD-10-CM | POA: Diagnosis not present

## 2020-06-11 DIAGNOSIS — Z4789 Encounter for other orthopedic aftercare: Secondary | ICD-10-CM | POA: Diagnosis not present

## 2020-06-14 DIAGNOSIS — M25512 Pain in left shoulder: Secondary | ICD-10-CM | POA: Diagnosis not present

## 2020-06-20 ENCOUNTER — Telehealth: Payer: Medicare Other

## 2020-06-20 ENCOUNTER — Telehealth: Payer: Self-pay

## 2020-06-20 DIAGNOSIS — M25512 Pain in left shoulder: Secondary | ICD-10-CM | POA: Diagnosis not present

## 2020-06-20 NOTE — Chronic Care Management (AMB) (Addendum)
Chronic Care Management Pharmacy Assistant   Name: Ivylynn Hoppes  MRN: [id number] DOB: 1955-07-03  Reason for Encounter: Adherence  Recent office visits:  None since last CCM contact  Recent consult visits:  03/20/20 - Pulmonology - Start azithromycin 250mg  1 tablet every Monday, Wednesday, and Friday  Hospital visits:  05/28/2020 - [facility] surgery left shoulder  Medications: Outpatient Encounter Medications as of 06/20/2020  Medication Sig Note   acetaminophen (TYLENOL) 500 MG tablet Take 1,000 mg by mouth every 6 (six) hours as needed for moderate pain or headache.    albuterol (VENTOLIN HFA) 108 (90 Base) MCG/ACT inhaler Inhale 2 puffs into the lungs every 6 (six) hours as needed for wheezing or shortness of breath. Okay to dispense proair/Ventolin/albuterol.    ALPRAZolam (XANAX) 0.5 MG tablet TAKE 1/2 TO 1 TABLET BY  MOUTH TWICE DAILY AS NEEDED FOR ANXIETY    Budeson-Glycopyrrol-Formoterol (BREZTRI AEROSPHERE) 160-9-4.8 MCG/ACT AERO Inhale 2 puffs into the lungs every 12 (twelve) hours.    buPROPion (WELLBUTRIN XL) 300 MG 24 hr tablet Take 1 tablet (300 mg total) by mouth daily.    Calcium 500 MG CHEW Chew 500 mg by mouth 2 (two) times daily.     Cholecalciferol (DIALYVITE VITAMIN D 5000) 125 MCG (5000 UT) capsule 1 tab a day except for 2 on Sundays (Patient taking differently: Take 5,000-10,000 Units by mouth See admin instructions. 5000 units a day except for 10,000 units on Sundays)    diclofenac Sodium (VOLTAREN) 1 % GEL Apply 1 application topically daily as needed (pain).    fluticasone (FLONASE) 50 MCG/ACT nasal spray Place 2 sprays into both nostrils daily as needed for allergies or rhinitis.    Homeopathic Products (THERAWORX RELIEF EX) Apply 1 application topically daily as needed (pain). Applying prn to bicep muscle since shoulder break.    levothyroxine (SYNTHROID) 125 MCG tablet TAKE 1 TABLET BY MOUTH  DAILY (Patient taking differently: Take  125-187.5 mcg by mouth See admin instructions. 125 mcg daily except on Sunday 187.5 mcg)    meclizine (ANTIVERT) 25 MG tablet Take 12.5-25 mg by mouth 3 (three) times daily as needed for dizziness.    metoprolol succinate (TOPROL-XL) 25 MG 24 hr tablet TAKE 1 TABLET BY MOUTH  DAILY (Patient taking differently: Take 25 mg by mouth daily.)    Multiple Vitamins-Minerals (MULTIVITAMIN WITH MINERALS) tablet Take 1 tablet by mouth daily.    ondansetron (ZOFRAN ODT) 4 MG disintegrating tablet Take 1 tablet (4 mg total) by mouth every 8 (eight) hours as needed for nausea or vomiting.    oxyCODONE (ROXICODONE) 5 MG immediate release tablet Take 1 tablet (5 mg total) by mouth every 6 (six) hours as needed for severe pain.    Polyethyl Glycol-Propyl Glycol (SYSTANE OP) Place 1 drop into both eyes every morning.    Spacer/Aero-Holding Chambers (AEROCHAMBER MV) inhaler Use as instructed    tiZANidine (ZANAFLEX) 4 MG tablet Take 0.5-1 tablets (2-4 mg total) by mouth every 6 (six) hours as needed for muscle spasms.    valsartan (DIOVAN) 40 MG tablet Take 1 tablet (40 mg total) by mouth daily.    vitamin C (ASCORBIC ACID) 250 MG tablet Take 500 mg by mouth daily. 05/17/2020: Chewables   No facility-administered encounter medications on file as of 06/20/2020.    [place] for general disease state and medication adherence call.   Patient is not > 5 days past due for refill on the following medications per chart  history: The patient reports she get valsartan 40mg  through mail order [facility]   [facility] Medications: Medication Name/mg Last Fill Days Supply Valsartan 40mg   12/29/2019 90  What concerns do you have about your medications?none identified  The patient denies side effects with her medications.   How often do you forget or accidentally miss a dose? Never  Do you use a pillbox? Yes the patient reports she uses a 3 part organizer to fill with medications and takes at bedtime  Are  you having any problems getting your medications from your pharmacy? No the patient reports she uses [facility] and always puts order in early so she does not run out   Has the cost of your medications been a concern? No  Since last visit with CPP, no interventions have been made.  The patient has not had an ED visit since last contact.   The patient denies problems with their health. The patient reports she had left shoulder reverse total shoulder replacement in May 2022 and is recovering well and doing physical therapy  she denies  concerns or questions for Debbora Dus, Pharm. D at this time.   Counseled patient on: [place] job taking medications. The patient is not missing any doses of her medication and has medications organized to take at bedtime. She is using a combination of Breztri and azithrymicin over the past 3 weeks due to her breathing and this extreme heat and she has albuterol inhaler for emergencies. I reminded the patient she can access the CCM team for any cost, medication, or pharmacy concerns  No appointments scheduled within the next 30 days.  Debbora Dus, CPP notified  Avel Sensor, [place] Assistant [phone]  I have reviewed the care management and care coordination activities outlined in this encounter and I am certifying that I agree with the content of this note. No further action required.  Debbora Dus, PharmD Clinical Pharmacist [facility] [phone]

## 2020-06-24 DIAGNOSIS — M25512 Pain in left shoulder: Secondary | ICD-10-CM | POA: Diagnosis not present

## 2020-06-27 DIAGNOSIS — M25512 Pain in left shoulder: Secondary | ICD-10-CM | POA: Diagnosis not present

## 2020-07-09 DIAGNOSIS — Z4789 Encounter for other orthopedic aftercare: Secondary | ICD-10-CM | POA: Diagnosis not present

## 2020-07-11 DIAGNOSIS — M25512 Pain in left shoulder: Secondary | ICD-10-CM | POA: Diagnosis not present

## 2020-07-15 ENCOUNTER — Telehealth: Payer: Self-pay | Admitting: Pulmonary Disease

## 2020-07-16 DIAGNOSIS — M25512 Pain in left shoulder: Secondary | ICD-10-CM | POA: Diagnosis not present

## 2020-07-16 NOTE — Telephone Encounter (Signed)
Nothing noted in message. Will close encounter.  

## 2020-07-17 ENCOUNTER — Encounter: Payer: Self-pay | Admitting: Family Medicine

## 2020-07-18 ENCOUNTER — Other Ambulatory Visit: Payer: Self-pay

## 2020-07-18 MED ORDER — ALBUTEROL SULFATE HFA 108 (90 BASE) MCG/ACT IN AERS: 2.0000 | INHALATION_SPRAY | Freq: Four times a day (QID) | RESPIRATORY_TRACT | 11 refills | Status: DC | PRN

## 2020-07-18 MED ORDER — AZITHROMYCIN 250 MG PO TABS: ORAL_TABLET | ORAL | 6 refills | Status: DC

## 2020-07-18 NOTE — Telephone Encounter (Signed)
Refill approved.

## 2020-07-18 NOTE — Telephone Encounter (Signed)
Refill request for Albuterol and Azithromycin.  Last seen 03/20/2020.

## 2020-07-22 ENCOUNTER — Telehealth: Payer: Self-pay | Admitting: Pulmonary Disease

## 2020-07-22 DIAGNOSIS — M25512 Pain in left shoulder: Secondary | ICD-10-CM | POA: Diagnosis not present

## 2020-07-22 NOTE — Telephone Encounter (Signed)
Called and spoke with Patient.  Patient stated Dr.Icard referred her to Dr. Al Pimple.  Patient stated Dr. Harrell Gave is moving offices across town and Patient would like to stay at the [facility] area, but does not know what cardiologist to see.  Patient is requesting a new cardiology referral from Dr. Valeta Harms.  Message routed to Dr. Valeta Harms to advise

## 2020-07-23 DIAGNOSIS — H25043 Posterior subcapsular polar age-related cataract, bilateral: Secondary | ICD-10-CM | POA: Diagnosis not present

## 2020-07-23 DIAGNOSIS — H18413 Arcus senilis, bilateral: Secondary | ICD-10-CM | POA: Diagnosis not present

## 2020-07-23 DIAGNOSIS — H25013 Cortical age-related cataract, bilateral: Secondary | ICD-10-CM | POA: Diagnosis not present

## 2020-07-23 DIAGNOSIS — H2511 Age-related nuclear cataract, right eye: Secondary | ICD-10-CM | POA: Diagnosis not present

## 2020-07-23 DIAGNOSIS — H2513 Age-related nuclear cataract, bilateral: Secondary | ICD-10-CM | POA: Diagnosis not present

## 2020-07-24 ENCOUNTER — Other Ambulatory Visit: Payer: Self-pay | Admitting: Family Medicine

## 2020-07-24 MED ORDER — ALPRAZOLAM 0.5 MG PO TABS: 0.2500 mg | ORAL_TABLET | Freq: Two times a day (BID) | ORAL | 2 refills | Status: DC | PRN

## 2020-07-24 MED ORDER — BUPROPION HCL ER (XL) 300 MG PO TB24: 300.0000 mg | ORAL_TABLET | Freq: Every day | ORAL | 1 refills | Status: DC

## 2020-07-24 MED ORDER — MECLIZINE HCL 25 MG PO TABS: 12.5000 mg | ORAL_TABLET | Freq: Three times a day (TID) | ORAL | 1 refills | Status: DC | PRN

## 2020-07-24 MED ORDER — TIZANIDINE HCL 4 MG PO TABS: 2.0000 mg | ORAL_TABLET | Freq: Four times a day (QID) | ORAL | 1 refills | Status: DC | PRN

## 2020-07-24 MED ORDER — LEVOTHYROXINE SODIUM 125 MCG PO TABS: ORAL_TABLET | ORAL | 1 refills | Status: DC

## 2020-07-24 NOTE — Progress Notes (Signed)
Rxs printed.  See hard copy.

## 2020-07-30 NOTE — Telephone Encounter (Signed)
Lmam for pt with [facility]'s phone number

## 2020-07-30 NOTE — Telephone Encounter (Signed)
PCCs are you familiar with cardiologists in the [facility] area? Per pt.

## 2020-08-06 DIAGNOSIS — M25512 Pain in left shoulder: Secondary | ICD-10-CM | POA: Diagnosis not present

## 2020-08-07 ENCOUNTER — Telehealth: Payer: Self-pay | Admitting: Pulmonary Disease

## 2020-08-07 NOTE — Telephone Encounter (Signed)
Spoke to pt she is going to stay with with her cardiologist she already has Dr Jasmine Awe

## 2020-08-07 NOTE — Telephone Encounter (Signed)
Lmtcb follow up on this cardiology appt April [name]

## 2020-08-07 NOTE — Telephone Encounter (Signed)
Spoke to pt she is going to keep her cardiologist she has now [place] [place]

## 2020-08-08 ENCOUNTER — Other Ambulatory Visit: Payer: Self-pay | Admitting: Family Medicine

## 2020-08-12 ENCOUNTER — Other Ambulatory Visit: Payer: Self-pay | Admitting: Family Medicine

## 2020-08-13 DIAGNOSIS — M25512 Pain in left shoulder: Secondary | ICD-10-CM | POA: Diagnosis not present

## 2020-08-20 DIAGNOSIS — Z96612 Presence of left artificial shoulder joint: Secondary | ICD-10-CM | POA: Diagnosis not present

## 2020-09-03 ENCOUNTER — Telehealth: Payer: Self-pay | Admitting: Family Medicine

## 2020-09-03 DIAGNOSIS — Z780 Asymptomatic menopausal state: Secondary | ICD-10-CM

## 2020-09-03 NOTE — Telephone Encounter (Signed)
Last Dexa was done 11/14/2018

## 2020-09-03 NOTE — Telephone Encounter (Signed)
Pt wanted to know if it was time for her to have a Dexa

## 2020-09-04 NOTE — Addendum Note (Signed)
Addended by: [name] on: 09/04/2020 12:40 PM   Modules accepted: Orders

## 2020-09-04 NOTE — Telephone Encounter (Signed)
Called patient and notified to have repeat dexa scan done after 11/13/20. Patient will call and get appt scheduled.

## 2020-09-04 NOTE — Telephone Encounter (Signed)
Reasonable to recheck after 11/13/20.  I put in the f/u order.  Thanks.

## 2020-09-20 DIAGNOSIS — H2512 Age-related nuclear cataract, left eye: Secondary | ICD-10-CM | POA: Diagnosis not present

## 2020-09-20 DIAGNOSIS — H2511 Age-related nuclear cataract, right eye: Secondary | ICD-10-CM | POA: Diagnosis not present

## 2020-09-26 ENCOUNTER — Telehealth: Payer: Self-pay

## 2020-09-26 ENCOUNTER — Other Ambulatory Visit: Payer: Self-pay

## 2020-09-26 ENCOUNTER — Emergency Department (HOSPITAL_COMMUNITY)
Admission: EM | Admit: 2020-09-26 | Discharge: 2020-09-27 | Disposition: A | Discharge status: Home / Self Care | Payer: Medicare Other | Attending: Emergency Medicine | Admitting: Emergency Medicine

## 2020-09-26 ENCOUNTER — Encounter (HOSPITAL_COMMUNITY): Payer: Self-pay | Admitting: Emergency Medicine

## 2020-09-26 DIAGNOSIS — Z79899 Other long term (current) drug therapy: Secondary | ICD-10-CM | POA: Insufficient documentation

## 2020-09-26 DIAGNOSIS — R519 Headache, unspecified: Secondary | ICD-10-CM | POA: Diagnosis not present

## 2020-09-26 DIAGNOSIS — E039 Hypothyroidism, unspecified: Secondary | ICD-10-CM | POA: Diagnosis not present

## 2020-09-26 DIAGNOSIS — Z87891 Personal history of nicotine dependence: Secondary | ICD-10-CM | POA: Diagnosis not present

## 2020-09-26 DIAGNOSIS — Z20822 Contact with and (suspected) exposure to covid-19: Secondary | ICD-10-CM | POA: Diagnosis not present

## 2020-09-26 DIAGNOSIS — I10 Essential (primary) hypertension: Secondary | ICD-10-CM

## 2020-09-26 DIAGNOSIS — J45909 Unspecified asthma, uncomplicated: Secondary | ICD-10-CM | POA: Insufficient documentation

## 2020-09-26 DIAGNOSIS — J449 Chronic obstructive pulmonary disease, unspecified: Secondary | ICD-10-CM | POA: Insufficient documentation

## 2020-09-26 DIAGNOSIS — Z96641 Presence of right artificial hip joint: Secondary | ICD-10-CM | POA: Insufficient documentation

## 2020-09-26 DIAGNOSIS — J439 Emphysema, unspecified: Secondary | ICD-10-CM | POA: Diagnosis not present

## 2020-09-26 DIAGNOSIS — Z96642 Presence of left artificial hip joint: Secondary | ICD-10-CM | POA: Diagnosis not present

## 2020-09-26 LAB — COMPREHENSIVE METABOLIC PANEL
ALT: 31 U/L (ref 0–44)
AST: 25 U/L (ref 15–41)
Albumin: 4 g/dL (ref 3.5–5.0)
Alkaline Phosphatase: 90 U/L (ref 38–126)
Anion gap: 9 (ref 5–15)
BUN: 12 mg/dL (ref 8–23)
CO2: 26 mmol/L (ref 22–32)
Calcium: 9.4 mg/dL (ref 8.9–10.3)
Chloride: 103 mmol/L (ref 98–111)
Creatinine, Ser: 1.15 mg/dL — ABNORMAL HIGH (ref 0.44–1.00)
GFR, Estimated: 52 mL/min — ABNORMAL LOW (ref >60)
Glucose, Bld: 125 mg/dL — ABNORMAL HIGH (ref 70–99)
Potassium: 3.7 mmol/L (ref 3.5–5.1)
Sodium: 138 mmol/L (ref 135–145)
Total Bilirubin: 1 mg/dL (ref 0.3–1.2)
Total Protein: 7.3 g/dL (ref 6.5–8.1)

## 2020-09-26 LAB — URINALYSIS, ROUTINE W REFLEX MICROSCOPIC
Bilirubin Urine: NEGATIVE
Glucose, UA: NEGATIVE mg/dL
Hgb urine dipstick: NEGATIVE
Ketones, ur: NEGATIVE mg/dL
Leukocytes,Ua: NEGATIVE
Nitrite: NEGATIVE
Protein, ur: NEGATIVE mg/dL
Specific Gravity, Urine: 1.003 — ABNORMAL LOW (ref 1.005–1.030)
pH: 6 (ref 5.0–8.0)

## 2020-09-26 LAB — CBC WITH DIFFERENTIAL/PLATELET
Abs Immature Granulocytes: 0.03 10*3/uL (ref 0.00–0.07)
Basophils Absolute: 0 10*3/uL (ref 0.0–0.1)
Basophils Relative: 1 %
Eosinophils Absolute: 0.1 10*3/uL (ref 0.0–0.5)
Eosinophils Relative: 2 %
HCT: 41.6 % (ref 36.0–46.0)
Hemoglobin: 13.8 g/dL (ref 12.0–15.0)
Immature Granulocytes: 1 %
Lymphocytes Relative: 20 %
Lymphs Abs: 1.3 10*3/uL (ref 0.7–4.0)
MCH: 31.9 pg (ref 26.0–34.0)
MCHC: 33.2 g/dL (ref 30.0–36.0)
MCV: 96.1 fL (ref 80.0–100.0)
Monocytes Absolute: 0.4 10*3/uL (ref 0.1–1.0)
Monocytes Relative: 6 %
Neutro Abs: 4.7 10*3/uL (ref 1.7–7.7)
Neutrophils Relative %: 70 %
Platelets: 214 10*3/uL (ref 150–400)
RBC: 4.33 MIL/uL (ref 3.87–5.11)
RDW: 13.2 % (ref 11.5–15.5)
WBC: 6.6 10*3/uL (ref 4.0–10.5)
nRBC: 0 % (ref 0.0–0.2)

## 2020-09-26 NOTE — ED Provider Notes (Signed)
Emergency Medicine Provider Triage Evaluation Note  April [name] , a 65 y.o. female  was evaluated in triage.  Pt complains of hypertension.  Patient has history of same, however states that this morning her blood pressure was in the 103P systolic and that is new for her.  She takes metoprolol and valsartan for same she took 2 doses this morning without normalizing of her blood pressure.  Discussed with her PCP who sent her here.  States she has been having some associated headaches and lightheadedness.  Review of Systems  Positive: Headache, lightheadedness Negative: Fevers, chills  Physical Exam  BP (!) 184/86   Pulse 93   Temp 98.2 F (36.8 C) (Oral)   Resp 20   SpO2 96%  Gen:   Awake, no distress   Resp:  Normal effort  MSK:   Moves extremities without difficulty    Medical Decision Making  Medically screening exam initiated at 7:04 PM.  Appropriate orders placed.  April [name] was informed that the remainder of the evaluation will be completed by another provider, this initial triage assessment does not replace that evaluation, and the importance of remaining in the ED until their evaluation is complete.     April [name] 09/26/20 1906    Lennice Sites, DO 09/26/20 1950

## 2020-09-26 NOTE — Telephone Encounter (Signed)
[facility] - Client TELEPHONE ADVICE RECORD [facility] Patient Name: April [name] Gender: Female DOB: 09-18-1955 Age: 65 Y 62 M 14 D Return Phone Number: [phone] (Primary) Address: City/ State/ Zip: [place] [address] Client [facility] - Client Client Site [facility] - Day Physician Renford Dills - MD Contact Type Call Who Is Calling Patient / Member / Family / Caregiver Call Type Triage / Clinical Relationship To Patient Self Return Phone Number [phone] (Primary) Chief Complaint Heart palpitations or irregular heartbeat Reason for Call Symptomatic / Request for [place] has high blood pressure and high heart rate. Translation No Nurse Assessment Nurse: Fredderick Phenix, RN, Lelan Pons Date/Time Eilene Ghazi Time): 09/26/2020 2:11:58 PM Confirm and document reason for call. If symptomatic, describe symptoms. ---Caller states she had HTN this morning. 178/114 and pulse 111. She took an additional dose of Metropolol 25 mg and Valsartan 40 mg, above the daily prescribed dose. That brought her down 160/95 pulse 92. She is feeling lightheaded. Does the patient have any new or worsening symptoms? ---Yes Will a triage be completed? ---Yes Related visit to physician within the last 2 weeks? ---No Does the PT have any chronic conditions? (i.e. diabetes, asthma, this includes High risk factors for pregnancy, etc.) ---Yes List chronic conditions. ---Cataract surgery on right eye last week. HTN Is this a behavioral health or substance abuse call? ---No Guidelines Guideline Title Affirmed Question Affirmed Notes Nurse Date/Time (Eastern Time) Blood Pressure - High [2] Systolic BP >= 035 OR Diastolic >= 597 AND [4] cardiac or neurologic symptoms (e.g., chest pain, difficulty breathing, unsteady gait, blurred vision) Fredderick Phenix, RN, Lelan Pons 09/26/2020 2:17:04 PM PLEASE  NOTE: All timestamps contained within this report are represented as [place] Standard Time. CONFIDENTIALTY NOTICE: This fax transmission is intended only for the addressee. It contains information that is legally privileged, confidential or otherwise protected from use or disclosure. If you are not the intended recipient, you are strictly prohibited from reviewing, disclosing, copying using or disseminating any of this information or taking any action in reliance on or regarding this information. If you have received this fax in error, please notify us immediately by telephone so that we can arrange for its return to [place]. Phone: [phone], Toll-Free: [phone], Fax: [phone] Page: 2 of 2 Call Id: [id number] [place]. Time Eilene Ghazi Time) Disposition Final User 09/26/2020 2:20:16 PM Go to ED Now Yes Fredderick Phenix, RN, [place] Disagree/Comply Comply Caller Understands Yes PreDisposition Did not know what to do Care Advice Given Per Guideline GO TO ED NOW: * You need to be seen in the Emergency Department. * Leave now. Drive carefully. CARE ADVICE given per High Blood Pressure (Adult) guideline. Referrals GO TO FACILITY UNDECIDED

## 2020-09-26 NOTE — Telephone Encounter (Signed)
Noted.  Please check on patient tomorrow.  Thanks.

## 2020-09-26 NOTE — ED Triage Notes (Signed)
Patient coming from home, complaint of hypertension. Patient states PCP sent her here, as BP has stayed high even on medication.

## 2020-09-26 NOTE — Telephone Encounter (Signed)
I spoke with pt and she decided not to go to ED but pt is being signed in now at [facility]. Sending note to Dr Damita Dunnings and Janett Billow CMA. Will also send teams to [facility].

## 2020-09-27 ENCOUNTER — Emergency Department (HOSPITAL_COMMUNITY): Payer: Medicare Other

## 2020-09-27 ENCOUNTER — Encounter (HOSPITAL_COMMUNITY): Payer: Self-pay | Admitting: Emergency Medicine

## 2020-09-27 ENCOUNTER — Telehealth: Payer: Self-pay | Admitting: Family Medicine

## 2020-09-27 DIAGNOSIS — R519 Headache, unspecified: Secondary | ICD-10-CM | POA: Diagnosis not present

## 2020-09-27 DIAGNOSIS — I10 Essential (primary) hypertension: Secondary | ICD-10-CM | POA: Diagnosis not present

## 2020-09-27 DIAGNOSIS — Z20822 Contact with and (suspected) exposure to covid-19: Secondary | ICD-10-CM | POA: Diagnosis not present

## 2020-09-27 DIAGNOSIS — J439 Emphysema, unspecified: Secondary | ICD-10-CM | POA: Diagnosis not present

## 2020-09-27 LAB — SARS CORONAVIRUS 2 (TAT 6-24 HRS): SARS Coronavirus 2: NEGATIVE

## 2020-09-27 MED ORDER — ACETAMINOPHEN 500 MG PO TABS
1000.0000 mg | ORAL_TABLET | Freq: Once | ORAL | Status: AC
  Administered 2020-09-27: 1000 mg via ORAL
  Filled 2020-09-27: qty 2

## 2020-09-27 MED ORDER — SODIUM CHLORIDE 0.9 % IV BOLUS
500.0000 mL | Freq: Once | INTRAVENOUS | Status: AC
  Administered 2020-09-27: 500 mL via INTRAVENOUS

## 2020-09-27 NOTE — Telephone Encounter (Signed)
Please get update on patient/BP either today or Monday.  Thanks.

## 2020-09-27 NOTE — ED Provider Notes (Signed)
[facility] EMERGENCY DEPARTMENT Provider Note   CSN: [id number] Arrival date & time: 09/26/20  1659     History Chief Complaint  Patient presents with   Hypertension    April [name] is a 65 y.o. female.  The history is provided by the patient.  Hypertension This is a chronic problem. The current episode started more than 1 week ago. The problem occurs constantly. The problem has been gradually worsening. Pertinent negatives include no chest pain, no abdominal pain, no headaches and no shortness of breath. Nothing aggravates the symptoms. Nothing relieves the symptoms. She has tried nothing for the symptoms. The treatment provided no relief.  Reportedly sent in by PMD for BP elevation.  States she doesn't feel fell but can't elaborate.  No CP, no SOb, no n/v/d.  No HA.  No weakness.  No changes in vision or speech.      Past Medical History:  Diagnosis Date   Anxiety    Anxiety and depression    related to caring for mother during terminal illness   Arthritis    Asthma    AVN (avascular necrosis of bone) ([place])    hip and wrist   Bronchitis    hx of   COPD (chronic obstructive pulmonary disease) ([place])    Depression    Endometriosis    FH: CAD (coronary artery disease)    GERD (gastroesophageal reflux disease)    ocassional   History of blood transfusion    after hip repackment - broke out in hives and started itching   History of palpitations    Hypertension    Hypothyroid    Non-ischemic cardiomyopathy ([place]) 2019   Osteopenia    forteo through Dr. Estanislado Pandy (started 10/12)   Osteoporosis    PMR (polymyalgia rheumatica) ([place])    Pneumonia 2019   SIRS (systemic inflammatory response syndrome) ([place]) 10/2017   Smoker    SOB (shortness of breath) on exertion    Squamous cell carcinoma    facial, 2016   Temporal arteritis ([place])    s/p prednisone taper    Patient Active Problem List   Diagnosis Date Noted   Healthcare maintenance  11/26/2019   Medication management 08/25/2019   Macromastia 05/31/2019   Allergic rhinitis 04/06/2019   Pain in left ankle and joints of left foot 03/30/2019   Medicare welcome exam 08/29/2018   Aortic atherosclerosis ([place]) 12/17/2017   Pulmonary nodule 12/17/2017   Muscle spasm 12/13/2017   GERD (gastroesophageal reflux disease) 12/12/2017   CAP (community acquired pneumonia) 11/24/2017   Left-sided chest wall pain 11/24/2017   CKD (chronic kidney disease), stage III ([place]) 10/28/2017   Dizziness 10/27/2017   Headache 10/27/2017   Creatinine elevation 08/26/2017   SOB (shortness of breath) 08/26/2017   Tachycardia 06/29/2017   HTN (hypertension) 06/23/2017   Polymyalgia rheumatica ([place]) 05/22/2016   History of bilateral hip replacements 05/22/2016   DDD (degenerative disc disease), lumbar 05/22/2016   BPV (benign positional vertigo) 05/20/2016   Colon cancer screening 08/20/2015   Advance care planning 08/14/2014   Vitamin D deficiency 08/14/2014   COPD (chronic obstructive pulmonary disease) ([place]) 06/04/2014   Avascular necrosis of bone of right hip ([place]) 10/06/2012   Routine general medical examination at a health care facility 04/07/2011   Osteopenia 06/09/2010   AVN (avascular necrosis of bone) ([place]) 05/16/2010   Asthma 03/23/2010   Osteoarthritis 03/23/2010   Hypothyroidism 03/21/2010   ANXIETY DEPRESSION 03/21/2010   Former smoker 03/21/2010  SKIN LESION 03/21/2010   TEMPORAL ARTERITIS 03/21/2010    Past Surgical History:  Procedure Laterality Date   ABDOMINAL ADHESION SURGERY     ABDOMINAL HYSTERECTOMY     BREAST EXCISIONAL BIOPSY Left    BREAST REDUCTION SURGERY Bilateral 06/13/2019   Procedure: MAMMARY REDUCTION  (BREAST);  Surgeon: Cindra Presume, MD;  Location: [facility];  Service: Plastics;  Laterality: Bilateral;   BREAST SURGERY Left    benign bx 1990   CARDIAC CATHETERIZATION  2007   no PCI   CHOLECYSTECTOMY     COLONOSCOPY W/ POLYPECTOMY      INCONTINENCE SURGERY     2007    JOINT REPLACEMENT Left 2012   left hip   REDUCTION MAMMAPLASTY     2021   REVERSE SHOULDER ARTHROPLASTY Left 05/28/2020   Procedure: REVERSE SHOULDER ARTHROPLASTY;  Surgeon: Nicholes Stairs, MD;  Location: [facility];  Service: Orthopedics;  Laterality: Left;  2.5 hrs   TONSILLECTOMY     TOTAL HIP ARTHROPLASTY Right 10/04/2012   Procedure: TOTAL HIP ARTHROPLASTY;  Surgeon: Garald Balding, MD;  Location: [facility];  Service: Orthopedics;  Laterality: Right;   WRIST SURGERY Left    2012/ left wrist/ bone removed due to necrosis     OB History   No obstetric history on file.     Family History  Problem Relation Age of Onset   Heart disease Mother    Hypertension Mother    Alzheimer's disease Mother    Colon cancer Mother    Kidney failure Mother    Arthritis Brother    Suicidality Brother    Colon polyps Brother    Breast cancer Maternal Aunt    Healthy Son    Esophageal cancer Neg Hx    Stomach cancer Neg Hx    Rectal cancer Neg Hx     Social History   Tobacco Use   Smoking status: Former    Packs/day: 0.33    Years: 34.00    Pack years: 11.22    Types: Cigarettes    Quit date: 10/24/2017    Years since quitting: 2.9   Smokeless tobacco: Never   Tobacco comments:    quit smoking october 2019  Vaping Use   Vaping Use: Former   Devices: tried - quited prior to 2019  Substance Use Topics   Alcohol use: Yes    Alcohol/week: 0.0 standard drinks    Comment: occasional- rarely   Drug use: Never    Home Medications Prior to Admission medications   Medication Sig Start Date End Date Taking? Authorizing Provider  acetaminophen (TYLENOL) 500 MG tablet Take 1,000 mg by mouth every 6 (six) hours as needed for moderate pain or headache.    [provider]  albuterol (VENTOLIN HFA) 108 (90 Base) MCG/ACT inhaler Inhale 2 puffs into the lungs every 6 (six) hours as needed for wheezing or shortness of breath. Okay to dispense  proair/Ventolin/albuterol. 07/18/20   Icard, Octavio Graves, DO  ALPRAZolam Duanne Moron) 0.5 MG tablet Take 0.5-1 tablets (0.25-0.5 mg total) by mouth 2 (two) times daily as needed. for anxiety 07/24/20   [name], MD  azithromycin [facility]) 250 MG tablet Take 1 tablet Monday, Wednesday and friday 07/18/20   Icard, Octavio Graves, DO  Budeson-Glycopyrrol-Formoterol (BREZTRI AEROSPHERE) 160-9-4.8 MCG/ACT AERO Inhale 2 puffs into the lungs every 12 (twelve) hours. 10/19/19   Lauraine Rinne, NP  buPROPion (WELLBUTRIN XL) 300 MG 24 hr tablet Take 1 tablet (300 mg total) by  mouth daily. 07/24/20   [name], MD  Calcium 500 MG CHEW Chew 500 mg by mouth 2 (two) times daily.     [provider]  Cholecalciferol (DIALYVITE VITAMIN D 5000) 125 MCG (5000 UT) capsule 1 tab a day except for 2 on Sundays Patient taking differently: Take 5,000-10,000 Units by mouth See admin instructions. 5000 units a day except for 10,000 units on Sundays 11/24/19   [name], MD  diclofenac Sodium (VOLTAREN) 1 % GEL Apply 1 application topically daily as needed (pain).    [provider]  fluticasone (FLONASE) 50 MCG/ACT nasal spray Place 2 sprays into both nostrils daily as needed for allergies or rhinitis.    [provider]  Homeopathic Products ([place] EX) Apply 1 application topically daily as needed (pain). Applying prn to bicep muscle since shoulder break.    [provider]  levothyroxine (SYNTHROID) 125 MCG tablet TAKE 1 TABLET BY MOUTH  DAILY EXCEPT TAKE 1 AND 1/2 TABLETS BY MOUTH ON SUNDAY 08/08/20   [name], MD  meclizine (ANTIVERT) 25 MG tablet Take 0.5-1 tablets (12.5-25 mg total) by mouth 3 (three) times daily as needed for dizziness. 07/24/20   [name], MD  metoprolol succinate (TOPROL-XL) 25 MG 24 hr tablet TAKE 1 TABLET BY MOUTH  DAILY 08/08/20   [name], MD  Multiple Vitamins-Minerals (MULTIVITAMIN WITH MINERALS) tablet Take 1 tablet by  mouth daily.    [provider]  ondansetron (ZOFRAN ODT) 4 MG disintegrating tablet Take 1 tablet (4 mg total) by mouth every 8 (eight) hours as needed for nausea or vomiting. 05/28/20   Nicholes Stairs, MD  oxyCODONE (ROXICODONE) 5 MG immediate release tablet Take 1 tablet (5 mg total) by mouth every 6 (six) hours as needed for severe pain. 05/28/20 05/28/21  Nicholes Stairs, MD  Polyethyl Glycol-Propyl Glycol (SYSTANE OP) Place 1 drop into both eyes every morning.    [provider]  Spacer/Aero-Holding Chambers (AEROCHAMBER MV) inhaler Use as instructed 02/06/19   [name], NP  tiZANidine (ZANAFLEX) 4 MG tablet TAKE 1/2 TO 1 TABLET BY  MOUTH EVERY 6 HOURS AS  NEEDED FOR MUSCLE SPASMS 08/08/20   [name], MD  valsartan (DIOVAN) 40 MG tablet Take 1 tablet (40 mg total) by mouth daily. 11/24/19   [name], MD  vitamin C (ASCORBIC ACID) 250 MG tablet Take 500 mg by mouth daily.    [provider]    Allergies    Sulfonamide derivatives, Alendronate sodium, Dilaudid [hydromorphone hcl], and Wound dressing adhesive  Review of Systems   Review of Systems  Constitutional:  Negative for fever.  HENT:  Negative for facial swelling.   Eyes:  Negative for redness.  Respiratory:  Negative for shortness of breath, wheezing and stridor.   Cardiovascular:  Negative for chest pain.  Gastrointestinal:  Negative for abdominal pain and vomiting.  Genitourinary:  Negative for difficulty urinating.  Musculoskeletal:  Negative for neck stiffness.  Skin:  Negative for color change and rash.  Neurological:  Negative for facial asymmetry and headaches.  Psychiatric/Behavioral:  Negative for agitation.   All other systems reviewed and are negative.  Physical Exam Updated Vital Signs BP (!) 138/97   Pulse 98   Temp 98.1 F (36.7 C) (Oral)   Resp 16   SpO2 96%   Physical Exam Vitals and nursing note reviewed. Exam conducted with a chaperone  present.  Constitutional:  General: She is not in acute distress.    Appearance: Normal appearance. She is not diaphoretic.  HENT:     Head: Normocephalic and atraumatic.     Nose: Nose normal.     Mouth/Throat:     Mouth: Mucous membranes are moist.  Eyes:     Extraocular Movements: Extraocular movements intact.     Conjunctiva/sclera: Conjunctivae normal.     Pupils: Pupils are equal, round, and reactive to light.  Cardiovascular:     Rate and Rhythm: Normal rate and regular rhythm.     Pulses: Normal pulses.     Heart sounds: Normal heart sounds.  Pulmonary:     Effort: Pulmonary effort is normal.     Breath sounds: Normal breath sounds.  Abdominal:     General: Abdomen is flat. Bowel sounds are normal.     Palpations: Abdomen is soft.     Tenderness: There is no abdominal tenderness. There is no guarding.  Musculoskeletal:        General: Normal range of motion.     Cervical back: Normal range of motion and neck supple.  Skin:    General: Skin is warm and dry.     Capillary Refill: Capillary refill takes less than 2 seconds.  Neurological:     General: No focal deficit present.     Mental Status: She is alert and oriented to person, place, and time.     Cranial Nerves: No cranial nerve deficit.     Deep Tendon Reflexes: Reflexes normal.  Psychiatric:        Mood and Affect: Mood normal.        Behavior: Behavior normal.        Thought Content: Thought content normal.    ED Results / Procedures / Treatments   Labs (all labs ordered are listed, but only abnormal results are displayed) Results for orders placed or performed during the hospital encounter of 09/26/20  CBC with Differential  Result Value Ref Range   WBC 6.6 4.0 - 10.5 K/uL   RBC 4.33 3.87 - 5.11 MIL/uL   Hemoglobin 13.8 12.0 - 15.0 g/dL   HCT 41.6 36.0 - 46.0 %   MCV 96.1 80.0 - 100.0 fL   MCH 31.9 26.0 - 34.0 pg   MCHC 33.2 30.0 - 36.0 g/dL   RDW 13.2 11.5 - 15.5 %   Platelets 214 150 - 400  K/uL   nRBC 0.0 0.0 - 0.2 %   Neutrophils Relative % 70 %   Neutro Abs 4.7 1.7 - 7.7 K/uL   Lymphocytes Relative 20 %   Lymphs Abs 1.3 0.7 - 4.0 K/uL   Monocytes Relative 6 %   Monocytes Absolute 0.4 0.1 - 1.0 K/uL   Eosinophils Relative 2 %   Eosinophils Absolute 0.1 0.0 - 0.5 K/uL   Basophils Relative 1 %   Basophils Absolute 0.0 0.0 - 0.1 K/uL   Immature Granulocytes 1 %   Abs Immature Granulocytes 0.03 0.00 - 0.07 K/uL  Comprehensive metabolic panel  Result Value Ref Range   Sodium 138 135 - 145 mmol/L   Potassium 3.7 3.5 - 5.1 mmol/L   Chloride 103 98 - 111 mmol/L   CO2 26 22 - 32 mmol/L   Glucose, Bld 125 (H) 70 - 99 mg/dL   BUN 12 8 - 23 mg/dL   Creatinine, Ser 1.15 (H) 0.44 - 1.00 mg/dL   Calcium 9.4 8.9 - 10.3 mg/dL   Total Protein 7.3 6.5 - 8.1  g/dL   Albumin 4.0 3.5 - 5.0 g/dL   AST 25 15 - 41 U/L   ALT 31 0 - 44 U/L   Alkaline Phosphatase 90 38 - 126 U/L   Total Bilirubin 1.0 0.3 - 1.2 mg/dL   GFR, Estimated 52 (L) >60 mL/min   Anion gap 9 5 - 15  Urinalysis, Routine w reflex microscopic Urine, Clean Catch  Result Value Ref Range   Color, Urine YELLOW YELLOW   APPearance CLEAR CLEAR   Specific Gravity, Urine 1.003 (L) 1.005 - 1.030   pH 6.0 5.0 - 8.0   Glucose, UA NEGATIVE NEGATIVE mg/dL   Hgb urine dipstick NEGATIVE NEGATIVE   Bilirubin Urine NEGATIVE NEGATIVE   Ketones, ur NEGATIVE NEGATIVE mg/dL   Protein, ur NEGATIVE NEGATIVE mg/dL   Nitrite NEGATIVE NEGATIVE   Leukocytes,Ua NEGATIVE NEGATIVE   CT HEAD WO CONTRAST (5MM)  Result Date: 09/27/2020 CLINICAL DATA:  Facial trauma, headaches, lightheaded EXAM: CT HEAD WITHOUT CONTRAST TECHNIQUE: Contiguous axial images were obtained from the base of the skull through the vertex without intravenous contrast. COMPARISON:  08/06/2007 FINDINGS: Brain: No evidence of acute infarction, hemorrhage, cerebral edema, mass, mass effect, or midline shift. Ventricles and sulci are normal for age. No extra-axial fluid  collection. Vascular: No hyperdense vessel or unexpected calcification. Skull: Normal. Negative for fracture or focal lesion. Sinuses/Orbits: No acute finding. Status post right lens replacement. Other: The mastoid air cells are well aerated. The soft tissues are unremarkable. IMPRESSION: No acute intracranial process. Electronically Signed   By: Merilyn Baba M.D.   On: 09/27/2020 01:51   DG Chest Portable 1 View  Result Date: 09/27/2020 CLINICAL DATA:  Hypertension. EXAM: PORTABLE CHEST 1 VIEW COMPARISON:  Chest radiograph dated 08/25/2019 and CT dated 11/13/2019. FINDINGS: Background of emphysema. No focal consolidation, pleural effusion or pneumothorax. The cardiac silhouette is within limits. No acute osseous pathology. Left shoulder arthroplasty. IMPRESSION: No active cardiopulmonary disease. Electronically Signed   By: Anner Crete M.D.   On: 09/27/2020 01:51     Radiology CT HEAD WO CONTRAST (5MM)  Result Date: 09/27/2020 CLINICAL DATA:  Facial trauma, headaches, lightheaded EXAM: CT HEAD WITHOUT CONTRAST TECHNIQUE: Contiguous axial images were obtained from the base of the skull through the vertex without intravenous contrast. COMPARISON:  08/06/2007 FINDINGS: Brain: No evidence of acute infarction, hemorrhage, cerebral edema, mass, mass effect, or midline shift. Ventricles and sulci are normal for age. No extra-axial fluid collection. Vascular: No hyperdense vessel or unexpected calcification. Skull: Normal. Negative for fracture or focal lesion. Sinuses/Orbits: No acute finding. Status post right lens replacement. Other: The mastoid air cells are well aerated. The soft tissues are unremarkable. IMPRESSION: No acute intracranial process. Electronically Signed   By: Merilyn Baba M.D.   On: 09/27/2020 01:51   DG Chest Portable 1 View  Result Date: 09/27/2020 CLINICAL DATA:  Hypertension. EXAM: PORTABLE CHEST 1 VIEW COMPARISON:  Chest radiograph dated 08/25/2019 and CT dated 11/13/2019.  FINDINGS: Background of emphysema. No focal consolidation, pleural effusion or pneumothorax. The cardiac silhouette is within limits. No acute osseous pathology. Left shoulder arthroplasty. IMPRESSION: No active cardiopulmonary disease. Electronically Signed   By: Anner Crete M.D.   On: 09/27/2020 01:51    Procedures Procedures   Medications Ordered in ED Medications  acetaminophen (TYLENOL) tablet 1,000 mg (1,000 mg Oral Given 09/27/20 0150)  sodium chloride 0.9 % bolus 500 mL (500 mLs Intravenous New Bag/Given 09/27/20 0150)    ED Course  I have reviewed the triage  vital signs and the nursing notes.  Pertinent labs & imaging results that were available during my care of the patient were reviewed by me and considered in my medical decision making (see chart for details).   Exam labs and imaging are benign and reassuring.  No signs of stroke.  COVID is pending, please check mychart.    April [name] was evaluated in Emergency Department on 09/27/2020 for the symptoms described in the history of present illness. She was evaluated in the context of the global COVID-19 pandemic, which necessitated consideration that the patient might be at risk for infection with the SARS-CoV-2 virus that causes COVID-19. Institutional protocols and algorithms that pertain to the evaluation of patients at risk for COVID-19 are in a state of rapid change based on information released by regulatory bodies including the CDC and federal and state organizations. These policies and algorithms were followed during the patient's care in the ED.  Final Clinical Impression(s) / ED Diagnoses Final diagnoses:  Primary hypertension  Person under investigation for COVID-19   Return for intractable cough, coughing up blood, fevers > 100.4 unrelieved by medication, shortness of breath, intractable vomiting, chest pain, shortness of breath, weakness, numbness, changes in speech, facial asymmetry, abdominal pain,  passing out, Inability to tolerate liquids or food, cough, altered mental status or any concerns. No signs of systemic illness or infection. The patient is nontoxic-appearing on exam and vital signs are within normal limits. I have reviewed the triage vital signs and the nursing notes. Pertinent labs & imaging results that were available during my care of the patient were reviewed by me and considered in my medical decision making (see chart for details). After history, exam, and medical workup I feel the patient has been appropriately medically screened and is safe for discharge home. Pertinent diagnoses were discussed with the patient. Patient was given return precautions. Rx / DC Orders ED Discharge Orders     None        Calandria Mullings, MD 09/27/20 0093

## 2020-09-30 ENCOUNTER — Other Ambulatory Visit: Payer: Self-pay

## 2020-09-30 ENCOUNTER — Ambulatory Visit (INDEPENDENT_AMBULATORY_CARE_PROVIDER_SITE_OTHER): Payer: Medicare Other | Admitting: Nurse Practitioner

## 2020-09-30 VITALS — BP 140/88 | HR 80 | Temp 97.7°F | Resp 14 | Ht 66.0 in | Wt 185.6 lb

## 2020-09-30 DIAGNOSIS — I10 Essential (primary) hypertension: Secondary | ICD-10-CM | POA: Diagnosis not present

## 2020-09-30 DIAGNOSIS — Z23 Encounter for immunization: Secondary | ICD-10-CM | POA: Insufficient documentation

## 2020-09-30 DIAGNOSIS — Z09 Encounter for follow-up examination after completed treatment for conditions other than malignant neoplasm: Secondary | ICD-10-CM | POA: Diagnosis not present

## 2020-09-30 NOTE — Assessment & Plan Note (Signed)
Was seen in the emergency department.  I did review radiology report of CT scan of head and labs inclusive of COVID swab.  Patient to keep appointment as scheduled with PCP for physical take blood pressures at home and report abnormal status.
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
## 2020-09-30 NOTE — Patient Instructions (Signed)
Nice to see you today. Keep taking medications as prescribed Continue taking blood pressure daily for the next week Follow up as scheduled, sooner if needed

## 2020-09-30 NOTE — Assessment & Plan Note (Signed)
Currently maintained on valsartan and metoprolol.  Taking medication as prescribed.  She does check her blood pressure as of late several times a day.  Since her emergency department follow-up she has been asymptomatic and had blood pressures within normal limits.  Did discuss over the next week to check her blood pressure once daily.  And if still within normal limit she can continue doing what her and Dr. Damita Dunnings to discuss.  I did recommend checking blood pressure 3 times a week.  Continue taking valsartan and metoprolol as prescribed.

## 2020-09-30 NOTE — Telephone Encounter (Signed)
LMTCB for BP readings

## 2020-09-30 NOTE — Telephone Encounter (Signed)
Pt returned Jessica's call. I advised her Janett Billow is gone for the day but she was calling for a BP update. She stated she was seen this morning by [facility] and BP was okay then. But can call back if any other information is needed.

## 2020-10-01 NOTE — Telephone Encounter (Signed)
Noted. Thanks.

## 2020-10-01 NOTE — Telephone Encounter (Signed)
I did not realize patient was seeing Romilda Garret, NP yesterday in office when I called her. Her BP at visit was 140/88; see OV note.

## 2020-10-03 NOTE — Telephone Encounter (Signed)
See 09/27/20 phone note and pt seen 09/30/20.

## 2020-10-04 ENCOUNTER — Encounter: Payer: Self-pay | Admitting: Gastroenterology

## 2020-10-04 DIAGNOSIS — H2512 Age-related nuclear cataract, left eye: Secondary | ICD-10-CM | POA: Diagnosis not present

## 2020-10-10 ENCOUNTER — Other Ambulatory Visit: Payer: Self-pay | Admitting: Family Medicine

## 2020-10-10 DIAGNOSIS — I428 Other cardiomyopathies: Secondary | ICD-10-CM

## 2020-10-24 ENCOUNTER — Telehealth: Payer: Self-pay | Admitting: Pulmonary Disease

## 2020-10-28 ENCOUNTER — Telehealth: Payer: Self-pay

## 2020-10-28 NOTE — Telephone Encounter (Signed)
Received the following message from patient:   "Reviewing messages it showed your office sent me a letter to schedule CT appt in August.  I never received  letter but I called your office mid August & was told it was too early & calendars weren't booking for November.  I called back mid October (last week) & am waiting to hear back from your office.  I had to go to ER at [facility] on 09/26/2020.  They did a chest CT scan.  Results are shown in My Chart.  I hope that is sufficient & I'll only need your office to book an appointment with you for followup in November.   If your office will let me know I will be very appreciative.  Thank you.   Sincerely,  April [name]"  Reviewed her chart, it appears that she had a CXR done back in September.   She is wanting to know if she still needs to have the CT scan as she has called several times and no one has yet to get this scheduled for her.

## 2020-10-28 NOTE — Progress Notes (Signed)
    Chronic Care Management Pharmacy Assistant   Name: Rodnesha Elie  MRN: [id number] DOB: September 11, 1955  Reason for Encounter: CCM (Reminder Call)   Medications: Outpatient Encounter Medications as of 10/28/2020  Medication Sig Note   acetaminophen (TYLENOL) 500 MG tablet Take 1,000 mg by mouth every 6 (six) hours as needed for moderate pain or headache.    albuterol (VENTOLIN HFA) 108 (90 Base) MCG/ACT inhaler Inhale 2 puffs into the lungs every 6 (six) hours as needed for wheezing or shortness of breath. Okay to dispense proair/Ventolin/albuterol.    ALPRAZolam (XANAX) 0.5 MG tablet Take 0.5-1 tablets (0.25-0.5 mg total) by mouth 2 (two) times daily as needed. for anxiety    Budeson-Glycopyrrol-Formoterol (BREZTRI AEROSPHERE) 160-9-4.8 MCG/ACT AERO Inhale 2 puffs into the lungs every 12 (twelve) hours.    buPROPion (WELLBUTRIN XL) 300 MG 24 hr tablet TAKE 1 TABLET BY MOUTH  DAILY    Calcium 500 MG CHEW Chew 500 mg by mouth 2 (two) times daily.     Cholecalciferol (DIALYVITE VITAMIN D 5000) 125 MCG (5000 UT) capsule 1 tab a day except for 2 on Sundays (Patient taking differently: Take 5,000-10,000 Units by mouth See admin instructions. 5000 units a day except for 10,000 units on Sundays)    Difluprednate 0.05 % EMUL Place 1 drop into the left eye 4 (four) times daily.    fluticasone (FLONASE) 50 MCG/ACT nasal spray Place 2 sprays into both nostrils daily as needed for allergies or rhinitis.    gatifloxacin (ZYMAXID) 0.5 % SOLN Place 1 drop into the left eye 4 (four) times daily.    levothyroxine (SYNTHROID) 125 MCG tablet TAKE 1 TABLET BY MOUTH  DAILY EXCEPT TAKE 1 AND 1/2 TABLETS BY MOUTH ON SUNDAY    meclizine (ANTIVERT) 25 MG tablet Take 0.5-1 tablets (12.5-25 mg total) by mouth 3 (three) times daily as needed for dizziness.    metoprolol succinate (TOPROL-XL) 25 MG 24 hr tablet TAKE 1 TABLET BY MOUTH  DAILY    Multiple Vitamins-Minerals (MULTIVITAMIN WITH MINERALS) tablet Take 1  tablet by mouth daily.    Polyethyl Glycol-Propyl Glycol (SYSTANE OP) Place 1 drop into both eyes every morning.    PROLENSA 0.07 % SOLN Place 1 drop into the left eye at bedtime.    Spacer/Aero-Holding Chambers (AEROCHAMBER MV) inhaler Use as instructed    tiZANidine (ZANAFLEX) 4 MG tablet TAKE 1/2 TO 1 TABLET BY  MOUTH EVERY 6 HOURS AS  NEEDED FOR MUSCLE SPASMS    valsartan (DIOVAN) 40 MG tablet TAKE 1 TABLET BY MOUTH  DAILY    vitamin C (ASCORBIC ACID) 250 MG tablet Take 500 mg by mouth daily. 05/17/2020: Chewables   No facility-administered encounter medications on file as of 10/28/2020.   Samirah Scarpati was contacted to remind her of her upcoming telephone visit with Debbora Dus on 10/31 at 3:00 PM. Patient was reminded to have all medications, supplements and any blood glucose and blood pressure readings available for review at appointment.  Star Rating Drugs: Medication:  Last Fill: Day Supply Valsartan 40mg  08/09/2020 [place], CPP notified  Marijean Niemann, [place] [phone]  Time Spent: 10 Minutes

## 2020-10-31 NOTE — Telephone Encounter (Signed)
Called pt to schedule recall colonoscopy. Pt has been scheduled for a virtual pre-visit on Wednesday, 11/20/20 at 3 PM. Pt's colonoscopy is scheduled in the [facility] on Wednesday, 12/04/20 at 3:30 PM. Pt is aware that she will need to arrive on the 4th floor with a care partner by 2:30 PM. Pt verbalized understanding and had no concerns at the end of the call.

## 2020-11-04 ENCOUNTER — Telehealth: Payer: Self-pay

## 2020-11-04 ENCOUNTER — Ambulatory Visit (INDEPENDENT_AMBULATORY_CARE_PROVIDER_SITE_OTHER): Payer: Medicare Other

## 2020-11-04 ENCOUNTER — Telehealth (INDEPENDENT_AMBULATORY_CARE_PROVIDER_SITE_OTHER): Payer: Medicare Other | Admitting: Family Medicine

## 2020-11-04 ENCOUNTER — Encounter: Payer: Self-pay | Admitting: Family Medicine

## 2020-11-04 ENCOUNTER — Other Ambulatory Visit: Payer: Self-pay

## 2020-11-04 VITALS — BP 133/87 | HR 93

## 2020-11-04 VITALS — BP 163/104 | HR 102 | Temp 98.3°F | Ht 66.0 in

## 2020-11-04 DIAGNOSIS — J439 Emphysema, unspecified: Secondary | ICD-10-CM | POA: Diagnosis not present

## 2020-11-04 DIAGNOSIS — I10 Essential (primary) hypertension: Secondary | ICD-10-CM

## 2020-11-04 DIAGNOSIS — J441 Chronic obstructive pulmonary disease with (acute) exacerbation: Secondary | ICD-10-CM

## 2020-11-04 DIAGNOSIS — J208 Acute bronchitis due to other specified organisms: Secondary | ICD-10-CM

## 2020-11-04 MED ORDER — DOXYCYCLINE HYCLATE 100 MG PO TABS: 100.0000 mg | ORAL_TABLET | Freq: Two times a day (BID) | ORAL | 0 refills | Status: DC

## 2020-11-04 MED ORDER — PREDNISONE 20 MG PO TABS: ORAL_TABLET | ORAL | 0 refills | Status: DC

## 2020-11-04 NOTE — Telephone Encounter (Signed)
Working on November CTs.  This is one I am working on.

## 2020-11-04 NOTE — Telephone Encounter (Signed)
Forwarding to pulmonologist - Pt needs refill on Breztri to patient assistance program ([employer]). Please fax.  -- Fax #: [phone] -- Please include PEP ID [id number] on cover letter -- this is [name] patient identifier for [place], PharmD Clinical Pharmacist [facility] at [facility] [phone]

## 2020-11-04 NOTE — Patient Instructions (Signed)
Dear [name],  Below is a summary of the goals we discussed during our follow up appointment on November 04, 2020. Please contact me anytime with questions or concerns.   Visit Information  Patient Care Plan: CCM Pharmacy Care Plan     Problem Identified: CHL AMB "PATIENT-SPECIFIC PROBLEM"      Long-Range Goal: Disease Management   Start Date: 11/04/2020  Priority: High  Note:   Current Barriers:  Needs Breztri refill through [employer]   Pharmacist Clinical Goal(s):  Patient will contact provider office for questions/concerns as evidenced notation of same in electronic health record through collaboration with PharmD and provider.   Interventions: 1:1 collaboration with [name], MD regarding development and update of comprehensive plan of care as evidenced by provider attestation and co-signature Inter-disciplinary care team collaboration (see longitudinal plan of care) Comprehensive medication review performed; medication list updated in electronic medical record  Hypertension (BP goal <140/90) -Controlled - per average home reading within goal -Current treatment: Metoprolol succinate 25 mg - 1 tablet daily Valsartan 40 mg - 1 tablet daily Patient has failed these meds in the past: amlodipine -Medications previously tried: none reported  -Current home readings: BP was elevated this morning at home, this was before medications - now her BP Evening readings: - 9/27 - 150/84  - 9/28 - 124/70 - 9/29 - 135/80 - 9/30 - 140/85 - 10/1 - 130/80 - 10/2 - 140/70 - 10/3 - 120/76 - 10/4 - 136/84 - 10/5 - 117/76 -10/10 - 140/90 -10/13 - 164/84 -10/16 - 120/80 -10/19 - 124/95 -10/20 - 150/84  Morning readings: -10/23 - 117/76 -10/26 - 130/84 -10/30 - 136/87 -10/31 - 163/104 (this morning before meds), 133/87, 93 this afternoon after rechecking  -Current dietary habits: limits caffeine/salt -Current exercise habits: not discussed today -Denies  hypotensive/hypertensive symptoms -Educated on BP goals and benefits of medications for prevention of heart attack, stroke and kidney damage; Proper BP monitoring technique; -Counseled to monitor BP at home weekly for next 4 weeks, document, and provide log at future appointments -We discussed appropriate monitoring technique.  -Recommended to continue current medication; She spoke with Dr. Lorelei Pont today, she plans to take one BP med at night, one in the morning. She will check BP once a week and bring log to PCP visit 11/25/20. Then we may cut back to checking monthly/PRN.  COPD (Goal: control symptoms and prevent exacerbations) -Followed by pulmonology, last visit 03/2020 -She currently has started doxycycline and prednisone 5 day course started today for acute bronchitis per Dr. Lorelei Pont, family medicine. She will call pulmonology if symptoms worsen. -Breztri through PAP, has 1 month on hand and needs a refill. Will will call [employer] for her. -Current treatment  Breztri (PAP) - 2 puffs every 12 hours  Albuterol - 2 puffs every 6 hours as needed Azithromycin 250 mg - 1 tablet on Mon, Wed, and Friday -Medications previously tried: Advair, Spiriva, Allegra -Gold Grade: Gold 4 (FEV1<40%) -Pulmonary function testing: 12/2018 FEV1 34% predicted -Exacerbations requiring treatment in last 6 months: none  -Patient reports consistent use of maintenance inhaler -Frequency of rescue inhaler use: 3-5 times/week- she has increased albuterol lately, she is not sure if this is related to allergies/fall season. Encouraged her to schedule follow up with pulmonology to discuss. -Recommended to continue current medication; I will update patient by text message on Breztri refill.   Patient Goals/Self-Care Activities Patient will:  - check blood pressure weekly, document, and provide at future  appointments  Follow Up Plan: The care management team will reach out to the patient again over the next 90 days.        Patient verbalizes understanding of instructions provided today and agrees to view in [place].   Debbora Dus, PharmD Clinical Pharmacist [facility] [facility] at [facility] [phone]

## 2020-11-04 NOTE — Progress Notes (Signed)
Baeleigh Devincent T. Brenn Gatton, MD Primary Care and Sports Medicine [facility] at [facility] [place], [address] Phone: [phone]  FAX: [phone]  April [name] - 65 y.o. female  MRN [id number]  Date of Birth: 04-22-1955  Visit Date: 11/04/2020  PCP: [name], MD  Referred by: [name], MD  Virtual Visit via Video Note:  I connected with  [name] on 11/04/2020  9:40 AM EDT by a video enabled telemedicine application and verified that I am speaking with the correct person using two identifiers.   Location patient: home computer, tablet, or smartphone Location provider: work or home office Consent: Verbal consent directly obtained from [employer]. Persons participating in the virtual visit: patient, provider  I discussed the limitations of evaluation and management by telemedicine and the availability of in person appointments. The patient expressed understanding and agreed to proceed.  Chief Complaint  Patient presents with   Nasal Congestion    Negative Rapid Covid test x 3-Symptoms started Saturday    Watery Eyes   Headache    Behind Right Eye   Cough   Scratchy Throat    History of Present Illness:  Feels terrible and looks terrible.  Runny nose and eyes since Saturday. This am and her throat is bad with some R pain behind her R eye.   Coughed up some yellow and green this morning. No aching.   Took 3 covid tests - sat, Sunday, and Monday.  SOB some this AM. She does have COPD at baseline She did use some albuterol, and this did seem to help.  Stomach has been queezy.  BP Readings from Last 3 Encounters:  11/04/20 (!) 163/104  09/30/20 140/88  09/27/20 (!) 153/89  - split BP meds.     Review of Systems as above: See pertinent positives and pertinent negatives per HPI No acute distress verbally   Observations/Objective/Exam:  An attempt was made to discern  vital signs over the phone and per patient if applicable and possible.   General:    Alert, Oriented, appears well and in no acute distress  Pulmonary:     On inspection no signs of respiratory distress.  Psych / Neurological:     Pleasant and cooperative.  Assessment and Plan:    ICD-10-CM   1. Acute bronchitis due to other specified organisms  J20.8     2. COPD exacerbation ([place])  J44.1      Cannot exclude COVID-19.  Check additional COVID test on Tuesday and Wednesday.  Antivirals if positive.  For now prednisone and albuterol with doxycycline.  Continue supportive care.  I discussed the assessment and treatment plan with the patient. The patient was provided an opportunity to ask questions and all were answered. The patient agreed with the plan and demonstrated an understanding of the instructions.   The patient was advised to call back or seek an in-person evaluation if the symptoms worsen or if the condition fails to improve as anticipated.  Follow-up: prn unless noted otherwise below No follow-ups on file.  Meds ordered this encounter  Medications   doxycycline (VIBRA-TABS) 100 MG tablet    Sig: Take 1 tablet (100 mg total) by mouth 2 (two) times daily.    Dispense:  20 tablet    Refill:  0   predniSONE (DELTASONE) 20 MG tablet    Sig: 2 tabs po daily for 5 days, then 1 tab po daily for  5 days    Dispense:  15 tablet    Refill:  0   No orders of the defined types were placed in this encounter.   Signed,  Maud Deed. Phelix Fudala, MD

## 2020-11-04 NOTE — Progress Notes (Signed)
Chronic Care Management Pharmacy Note  11/04/2020 Name:  April [name] MRN:  [id number] DOB:  [birth year]  Subjective: April [name] is an 65 y.o. year old female who is a primary patient of Damita Dunnings, Elveria Rising, MD.  The CCM team was consulted for assistance with disease management and care coordination needs.    Engaged with patient by telephone by telephone for follow up visit in response to provider referral for pharmacy case management and/or care coordination services.   Consent to Services:  The patient was given information about Chronic Care Management services, agreed to services, and gave verbal consent prior to initiation of services.  Please see initial visit note for detailed documentation.   Patient Care Team: [name], MD as PCP - General (Family Medicine) Buford Dresser, MD as PCP - Cardiology (Cardiology) Bo Merino, MD as Consulting Physician (Rheumatology) Burnice Logan, [facility] (Inactive) as Pharmacist (Pharmacist)  Recent office visits: 09/30/20 [facility] discharge follow up - HTN, States she woke up that day with dizziness. She checked her blood pressure and it was high. Was informed to go to ED. Went and was evaluated and discharged with no medication changes. She has been checking her blood pressure several times daily and all have been WNL since the ED encounter.  Recent consult visits: 03/20/20 - Pulmonology - Follow up since patient started azithromycin 256m 1 tablet every Monday, Wednesday, and Friday. She is doing well. Continue current therapy.   Hospital visits:  09/26/2020 - ED visit - HTN, follow up with PCP [facility]/28/2020 [facility]  Objective:  Lab Results  Component Value Date   CREATININE 1.15 (H) 09/26/2020   BUN 12 09/26/2020   GFR 41.29 (L) 08/25/2019   GFRNONAA 52 (L) 09/26/2020   GFRAA 50 (L) 06/13/2019   NA 138 09/26/2020   K 3.7 09/26/2020   CALCIUM 9.4 09/26/2020    CO2 26 09/26/2020   GLUCOSE 125 (H) 09/26/2020    Lab Results  Component Value Date/Time   GFR 41.29 (L) 08/25/2019 08:15 AM   GFR 45.02 (L) 08/23/2018 08:38 AM    Lab Results  Component Value Date   CHOL 181 11/20/2019   HDL 60.30 11/20/2019   LDLCALC 111 (H) 11/20/2019   TRIG 50.0 11/20/2019   CHOLHDL 3 11/20/2019    Hepatic Function Latest Ref Rng & Units 09/26/2020 08/25/2019 08/23/2018  Total Protein 6.5 - 8.1 g/dL 7.3 7.3 7.5  Albumin 3.5 - 5.0 g/dL 4.0 4.2 4.4  AST 15 - 41 U/L 25 27 16   ALT 0 - 44 U/L 31 29 15   Alk Phosphatase 38 - 126 U/L 90 100 95  Total Bilirubin 0.3 - 1.2 mg/dL 1.0 0.4 0.3    Lab Results  Component Value Date/Time   TSH 1.08 11/20/2019 08:18 AM   TSH 0.04 (L) 08/25/2019 08:15 AM   FREET4 1.01 06/28/2017 04:38 PM   FREET4 1.41 03/30/2011 08:44 AM    CBC Latest Ref Rng & Units 09/26/2020 05/24/2020 08/25/2019  WBC 4.0 - 10.5 K/uL 6.6 6.7 7.0  Hemoglobin 12.0 - 15.0 g/dL 13.8 13.1 13.1  Hematocrit 36.0 - 46.0 % 41.6 40.5 39.5  Platelets 150 - 400 K/uL 214 203 210.0    Lab Results  Component Value Date/Time   VD25OH 29.76 (L) 11/20/2019 08:18 AM   VD25OH 72.36 08/23/2018 08:38 AM    Clinical ASCVD: No  The 10-year ASCVD risk score (Arnett DK, et al., 2019) is: 9.2%  Values used to calculate the score:     Age: 1 years     Sex: Female     Is Non-Hispanic African American: No     Diabetic: No     Tobacco smoker: No     Systolic Blood Pressure: 263 mmHg     Is BP treated: Yes     HDL Cholesterol: 60.3 mg/dL     Total Cholesterol: 181 mg/dL    Depression screen [facility] 2/9 11/22/2019 07/17/2019 08/26/2018  Decreased Interest 0 0 0  Down, Depressed, Hopeless 0 0 0  PHQ - 2 Score 0 0 0  Altered sleeping 0 0 -  Tired, decreased energy 0 0 -  Change in appetite 0 0 -  Feeling bad or failure about yourself  0 0 -  Trouble concentrating 0 0 -  Moving slowly or fidgety/restless 0 0 -  Suicidal thoughts 0 0 -  PHQ-9 Score 0 0 -  Difficult  doing work/chores Not difficult at all Not difficult at all -  Some recent data might be hidden     Social History   Tobacco Use  Smoking Status Former   Packs/day: 0.33   Years: 34.00   Pack years: 11.22   Types: Cigarettes   Quit date: 10/24/2017   Years since quitting: 3.0  Smokeless Tobacco Never  Tobacco Comments   quit smoking october 2019   BP Readings from Last 3 Encounters:  11/04/20 133/87  11/04/20 (!) 163/104  09/30/20 140/88   Pulse Readings from Last 3 Encounters:  11/04/20 93  11/04/20 (!) 102  09/30/20 80   Wt Readings from Last 3 Encounters:  09/30/20 185 lb 9 oz (84.2 kg)  05/28/20 182 lb (82.6 kg)  05/24/20 183 lb 6 oz (83.2 kg)   BMI Readings from Last 3 Encounters:  11/04/20 29.95 kg/m  09/30/20 29.95 kg/m  05/28/20 29.38 kg/m    Assessment/Interventions: Review of patient past medical history, allergies, medications, health status, including review of consultants reports, laboratory and other test data, was performed as part of comprehensive evaluation and provision of chronic care management services.   SDOH:  (Social Determinants of Health) assessments and interventions performed: Yes  SDOH Screenings   Alcohol Screen: Low Risk    Last Alcohol Screening Score (AUDIT): 1  Depression (PHQ2-9): Low Risk    PHQ-2 Score: 0  Financial Resource Strain: Low Risk    Difficulty of Paying Living Expenses: Not very hard  Food Insecurity: No Food Insecurity   Worried About Charity fundraiser in the Last Year: Never true   Ran Out of Food in the Last Year: Never true  Housing: Low Risk    Last Housing Risk Score: 0  Physical Activity: Inactive   Days of Exercise per Week: 0 days   Minutes of Exercise per Session: 0 min  Social Connections: Not on file  Stress: No Stress Concern Present   Feeling of Stress : Not at all  Tobacco Use: Medium Risk   Smoking Tobacco Use: Former   Smokeless Tobacco Use: Never   Passive Exposure: Not on file   Transportation Needs: No Transportation Needs   Lack of Transportation (Medical): No   Lack of Transportation (Non-Medical): No    CCM Care Plan  Allergies  Allergen Reactions   Sulfonamide Derivatives     As a child.  Throat closed, rash.   Alendronate Sodium     GI upset   Dilaudid [Hydromorphone Hcl] Nausea And Vomiting   Wound  Dressing Adhesive     Redness, adhesive tapes. Needs PAPER TAPE.    Medications Reviewed Today     Reviewed by Elie Confer, CMA (Certified Medical Assistant) on 03/20/20 at 1333  Med List Status: <None>   Medication Order Taking? Sig Documenting Provider Last Dose Status Informant  acetaminophen (TYLENOL) 500 MG tablet [id number] Yes Take 1,000 mg by mouth every 6 (six) hours as needed for moderate pain or headache. [provider] Taking Active Self  albuterol (VENTOLIN HFA) 108 (90 Base) MCG/ACT inhaler [id number] Yes Inhale 2 puffs into the lungs every 6 (six) hours as needed for wheezing or shortness of breath. Okay to dispense proair/Ventolin/albuterol. Garner Nash, DO Taking Active   ALPRAZolam Duanne Moron) 0.5 MG tablet [id number] Yes Take 0.5-1 tablets (0.25-0.5 mg total) by mouth 2 (two) times daily as needed. for anxiety [name], MD Taking Active   Budeson-Glycopyrrol-Formoterol [facility] AEROSPHERE) 160-9-4.8 MCG/ACT Hollie Salk [id number] Yes Inhale 2 puffs into the lungs every 12 (twelve) hours. Lauraine Rinne, NP Taking Active   buPROPion (WELLBUTRIN XL) 300 MG 24 hr tablet [id number] Yes Take 1 tablet (300 mg total) by mouth daily. [name], MD Taking Active   Calcium 500 MG CHEW [id number] Yes Chew 500 mg by mouth 2 (two) times daily.  [provider] Taking Active Self  Cholecalciferol (DIALYVITE VITAMIN D 5000) 125 MCG (5000 UT) capsule [id number] Yes 1 tab a day except for 2 on Sundays [name], MD Taking Active   Homeopathic Products ([place]) [id number] Yes Apply topically. Applying prn to bicep  muscle since shoulder break. [provider] Taking Active   levothyroxine (SYNTHROID) 125 MCG tablet [id number] Yes TAKE 1 TABLET BY MOUTH  DAILY [name], MD Taking Active   metoprolol succinate (TOPROL-XL) 25 MG 24 hr tablet [id number] Yes TAKE 1 TABLET BY MOUTH  DAILY [name], MD Taking Active   Polyethyl Glycol-Propyl Glycol [facility]) [id number] Yes Place 1 drop into both eyes daily as needed (dryness).  [provider] Taking Active Self  Spacer/Aero-Holding Chambers (AEROCHAMBER MV) inhaler [id number] Yes Use as instructed [name], NP Taking Active Self  tiZANidine (ZANAFLEX) 4 MG tablet [id number] Yes Take 0.5-1 tablets (2-4 mg total) by mouth every 6 (six) hours as needed for muscle spasms. [name], MD Taking Active   valsartan (DIOVAN) 40 MG tablet [id number] Yes Take 1 tablet (40 mg total) by mouth daily. [name], MD Taking Active   vitamin C (ASCORBIC ACID) 250 MG tablet [id number] Yes Take 500 mg by mouth daily. [provider] Taking Active Self            Patient Active Problem List   Diagnosis Date Noted   Need for immunization against influenza 09/30/2020   Hospital discharge follow-up 09/30/2020   Healthcare maintenance 11/26/2019   Medication management 08/25/2019   Macromastia 05/31/2019   Allergic rhinitis 04/06/2019   Pain in left ankle and joints of left foot 03/30/2019   Medicare welcome exam 08/29/2018   Aortic atherosclerosis ([place]) 12/17/2017   Pulmonary nodule 12/17/2017   Muscle spasm 12/13/2017   GERD (gastroesophageal reflux disease) 12/12/2017   CAP (community acquired pneumonia) 11/24/2017   Left-sided chest wall pain 11/24/2017   CKD (chronic kidney disease), stage III ([place]) 10/28/2017   Dizziness 10/27/2017   Headache 10/27/2017   Creatinine elevation 08/26/2017   SOB (shortness of breath) 08/26/2017   Tachycardia 06/29/2017   HTN (hypertension) 06/23/2017  Polymyalgia  rheumatica ([place]) 05/22/2016   History of bilateral hip replacements 05/22/2016   DDD (degenerative disc disease), lumbar 05/22/2016   BPV (benign positional vertigo) 05/20/2016   Colon cancer screening 08/20/2015   Advance care planning 08/14/2014   Vitamin D deficiency 08/14/2014   COPD (chronic obstructive pulmonary disease) ([place]) 06/04/2014   Avascular necrosis of bone of right hip ([place]) 10/06/2012   Routine general medical examination at a health care facility 04/07/2011   Osteopenia 06/09/2010   AVN (avascular necrosis of bone) ([place]) 05/16/2010   Asthma 03/23/2010   Osteoarthritis 03/23/2010   Hypothyroidism 03/21/2010   ANXIETY DEPRESSION 03/21/2010   Former smoker 03/21/2010   SKIN LESION 03/21/2010   TEMPORAL ARTERITIS 03/21/2010    Immunization History  Administered Date(s) Administered   Fluad Quad(high Dose 65+) 08/26/2018, 11/24/2019, 09/30/2020   Influenza Split 10/15/2010   Influenza,inj,Quad PF,6+ Mos 10/05/2012, 10/24/2013, 10/12/2014, 11/12/2015, 10/16/2016, 10/14/2017   Influenza-Unspecified 08/26/2018   PFIZER(Purple Top)SARS-COV-2 Vaccination 03/05/2019, 04/04/2019, 10/27/2019   Pneumococcal Conjugate-13 10/26/2018   Pneumococcal Polysaccharide-23 01/05/2009, 11/24/2019   Tdap 04/06/2011    Conditions to be addressed/monitored:  Hypertension and COPD  Patient Care Plan: CCM Pharmacy Care Plan     Problem Identified: CHL AMB "PATIENT-SPECIFIC PROBLEM"      Long-Range Goal: Disease Management   Start Date: 11/04/2020  Priority: High  Note:   Current Barriers:  Needs Breztri refill through [facility]   Pharmacist Clinical Goal(s):  Patient will contact provider office for questions/concerns as evidenced notation of same in electronic health record through collaboration with PharmD and provider.   Interventions: 1:1 collaboration with [name], MD regarding development and update of comprehensive plan of care as evidenced by provider  attestation and co-signature Inter-disciplinary care team collaboration (see longitudinal plan of care) Comprehensive medication review performed; medication list updated in electronic medical record  Hypertension (BP goal <140/90) -Controlled - per average home reading within goal -Current treatment: Metoprolol succinate 25 mg - 1 tablet daily Valsartan 40 mg - 1 tablet daily Patient has failed these meds in the past: amlodipine -Medications previously tried: none reported  -Current home readings: BP was elevated this morning at home, this was before medications - now her BP Evening readings: - 9/27 - 150/84  - 9/28 - 124/70 - 9/29 - 135/80 - 9/30 - 140/85 - 10/1 - 130/80 - 10/2 - 140/70 - 10/3 - 120/76 - 10/4 - 136/84 - 10/5 - 117/76 -10/10 - 140/90 -10/13 - 164/84 -10/16 - 120/80 -10/19 - 124/95 -10/20 - 150/84  Morning readings: -10/23 - 117/76 -10/26 - 130/84 -10/30 - 136/87 -10/31 - 163/104 (this morning before meds), 133/87, 93 this afternoon after rechecking  -Current dietary habits: limits caffeine/salt -Current exercise habits: not discussed today -Denies hypotensive/hypertensive symptoms -Educated on BP goals and benefits of medications for prevention of heart attack, stroke and kidney damage; Proper BP monitoring technique; -Counseled to monitor BP at home weekly for next 4 weeks, document, and provide log at future appointments -We discussed appropriate monitoring technique.  -Recommended to continue current medication; She spoke with Dr. Lorelei Pont today, she plans to take one BP med at night, one in the morning. She will check BP once a week and bring log to PCP visit 11/25/20. Then we may cut back to checking monthly/PRN.  COPD (Goal: control symptoms and prevent exacerbations) -Followed by pulmonology, last visit 03/2020 -She currently has started doxycycline and prednisone 5 day course started today for acute bronchitis per Dr. Lorelei Pont, family  medicine. She  will call pulmonology if symptoms worsen. -Breztri through PAP, has 1 month on hand and needs a refill. Will will call [facility] for her. -Current treatment  Breztri (PAP) - 2 puffs every 12 hours  Albuterol - 2 puffs every 6 hours as needed Azithromycin 250 mg - 1 tablet on Mon, Wed, and Friday -Medications previously tried: Advair, Spiriva, Allegra -Gold Grade: Gold 4 (FEV1<40%) -Pulmonary function testing: 12/2018 FEV1 34% predicted -Exacerbations requiring treatment in last 6 months: none  -Patient reports consistent use of maintenance inhaler -Frequency of rescue inhaler use: 3-5 times/week- she has increased albuterol lately, she is not sure if this is related to allergies/fall season. Encouraged her to schedule follow up with pulmonology to discuss. -Recommended to continue current medication; I will update patient by text message on Breztri refill.   Patient Goals/Self-Care Activities Patient will:  - check blood pressure weekly, document, and provide at future appointments  Follow Up Plan: The care management team will reach out to the patient again over the next 90 days.       Patient's preferred pharmacy is:  [employer]  ([place]) - [place], [place] [facility] [place] [address] [place] [address] Phone: [phone] Fax: [place] [address] [place] AT [facility] [place] [address] Phone: [phone] Fax: [phone]  [facility] ([facility]) - [place], [place] [address] Phone: [phone] Fax: [phone]  - Patient uses mail order for all medications   Pill box? Yes, Yes the patient reports she uses a 3 part organizer to fill with medications and takes at bedtime   Debbora Dus, PharmD Clinical Pharmacist [facility] at [facility] [place] [phone]

## 2020-11-05 ENCOUNTER — Other Ambulatory Visit: Payer: Self-pay | Admitting: *Deleted

## 2020-11-05 ENCOUNTER — Other Ambulatory Visit: Payer: Self-pay | Admitting: Family Medicine

## 2020-11-05 DIAGNOSIS — Z87891 Personal history of nicotine dependence: Secondary | ICD-10-CM

## 2020-11-05 DIAGNOSIS — U071 COVID-19: Secondary | ICD-10-CM

## 2020-11-05 MED ORDER — MOLNUPIRAVIR EUA 200MG CAPSULE: 4.0000 | ORAL_CAPSULE | Freq: Two times a day (BID) | ORAL | 0 refills | Status: AC

## 2020-11-05 NOTE — Addendum Note (Signed)
Addended by: Carter Kitten on: 11/05/2020 12:55 PM   Modules accepted: Orders

## 2020-11-05 NOTE — Telephone Encounter (Signed)
Left message for Ms. Sabet to continue prednisone for COPD support/lungs per Dr. Lorelei Pont.

## 2020-11-05 NOTE — Telephone Encounter (Signed)
Left detailed message on pt's VM & mailed appt info to pt.  Nothing further needed at this time.

## 2020-11-05 NOTE — Telephone Encounter (Signed)
With covid, you use anti-virals rather than antibiotics.  She can stop her doxycycle.  Can you help me send this in?  There are a lot of pharmacies listed, and I am not sure which one to send it to.

## 2020-11-05 NOTE — Telephone Encounter (Signed)
Pt called stating that she had a video visit with Dr Lorelei Pont. Pt states that Dr Lorelei Pont said if she take a covid test and it came back positive that he would call her an antibiotic. Pt states that she took a covid test and it came back positive and would like a antibiotic called in.

## 2020-11-05 NOTE — Telephone Encounter (Signed)
Ms. Stephani notified as instructed by telephone.  Anti-Viral prescription sent to [facility] on [address].  Patient will stop the doxycycline.  Does she continue prednisone?  Please advise.

## 2020-11-06 MED ORDER — BREZTRI AEROSPHERE 160-9-4.8 MCG/ACT IN AERO: 2.0000 | INHALATION_SPRAY | Freq: Two times a day (BID) | RESPIRATORY_TRACT | 3 refills | Status: DC

## 2020-11-06 NOTE — Telephone Encounter (Signed)
Unable to print from my current computer. Will see if I can print from a computer downstairs.

## 2020-11-08 NOTE — Progress Notes (Signed)
11/08/2020 - Spoke with [facility]. They have not received a prescription for refills. Please fax to [phone]. Please add PEP ID # [id number] to fax cover sheet.  Time Spent: [place], CPP notified  Marijean Niemann, [place] Pharmacy Assistant [phone]

## 2020-11-10 ENCOUNTER — Other Ambulatory Visit: Payer: Self-pay | Admitting: Family Medicine

## 2020-11-10 DIAGNOSIS — I10 Essential (primary) hypertension: Secondary | ICD-10-CM

## 2020-11-10 DIAGNOSIS — E559 Vitamin D deficiency, unspecified: Secondary | ICD-10-CM

## 2020-11-10 DIAGNOSIS — R739 Hyperglycemia, unspecified: Secondary | ICD-10-CM

## 2020-11-15 NOTE — Progress Notes (Signed)
11/15/2020 - Spoke with [facility]; they did not receive the refill request for Breztri. I gave a verbal order to refill (per [place]). [facility] will call patient to coordinate delivery. I called patient and informed her via message that the refill had been started and [facility] would be contacting her. I advised that if she did not speak with them she could also call [phone] as they need to speak to her to coordinate delivery.  Time Spent:  [place], CPP notified  Marijean Niemann, [place] Assistant [phone]

## 2020-11-18 ENCOUNTER — Other Ambulatory Visit: Payer: Medicare Other

## 2020-11-20 ENCOUNTER — Encounter: Payer: Self-pay | Admitting: Gastroenterology

## 2020-11-20 ENCOUNTER — Other Ambulatory Visit: Payer: Self-pay

## 2020-11-20 ENCOUNTER — Ambulatory Visit (AMBULATORY_SURGERY_CENTER): Payer: Self-pay

## 2020-11-20 VITALS — Ht 66.0 in | Wt 183.0 lb

## 2020-11-20 DIAGNOSIS — Z86010 Personal history of colonic polyps: Secondary | ICD-10-CM

## 2020-11-20 MED ORDER — PLENVU 140 G PO SOLR: 1.0000 | Freq: Once | ORAL | 0 refills | Status: AC

## 2020-11-20 NOTE — Progress Notes (Signed)
Denies allergies to eggs or soy products. Denies complication of anesthesia or sedation. Denies use of weight loss medication. Denies use of O2.   Emmi instructions given for colonoscopy.  

## 2020-11-21 ENCOUNTER — Ambulatory Visit: Payer: Medicare Other | Admitting: Pulmonary Disease

## 2020-11-21 ENCOUNTER — Telehealth: Payer: Self-pay

## 2020-11-21 ENCOUNTER — Other Ambulatory Visit (INDEPENDENT_AMBULATORY_CARE_PROVIDER_SITE_OTHER): Payer: Medicare Other

## 2020-11-21 DIAGNOSIS — I10 Essential (primary) hypertension: Secondary | ICD-10-CM

## 2020-11-21 DIAGNOSIS — E559 Vitamin D deficiency, unspecified: Secondary | ICD-10-CM

## 2020-11-21 DIAGNOSIS — R739 Hyperglycemia, unspecified: Secondary | ICD-10-CM | POA: Diagnosis not present

## 2020-11-21 LAB — VITAMIN D 25 HYDROXY (VIT D DEFICIENCY, FRACTURES): VITD: 68.99 ng/mL (ref 30.00–100.00)

## 2020-11-21 LAB — LIPID PANEL
Cholesterol: 201 mg/dL — ABNORMAL HIGH (ref 0–200)
HDL: 60.1 mg/dL (ref >39.00)
LDL Cholesterol: 128 mg/dL — ABNORMAL HIGH (ref 0–99)
NonHDL: 141.09
Total CHOL/HDL Ratio: 3
Triglycerides: 65 mg/dL (ref 0.0–149.0)
VLDL: 13 mg/dL (ref 0.0–40.0)

## 2020-11-21 LAB — HEMOGLOBIN A1C: Hgb A1c MFr Bld: 6.1 % (ref 4.6–6.5)

## 2020-11-21 LAB — TSH: TSH: 1.53 u[IU]/mL (ref 0.35–5.50)

## 2020-11-21 NOTE — Telephone Encounter (Signed)
[facility] Night - Client Nonclinical Telephone Record  [facility] Client [facility] Night - Client Client Site [facility] - Night Provider Renford Dills - MD Contact Type Call Who Is Calling Patient / Member / Family / Caregiver Caller Name Charlena Haub Caller Phone Number [phone] Patient Name April [name] Patient DOB 1955/11/06 Call Type Message Only Information Provided Reason for Call Request for General Office Information Initial Comment Caller is calling in regards to a shipment from breztri for 3 month supply. She said her address is correct. Please call back the patient. Additional Comment Provided office hours. Disp. Time Disposition Final User 11/20/2020 6:39:25 PM General Information Provided Yes Rhea Belton Call Closed By: Rhea Belton Transaction Date/Time: 11/20/2020 6:34:54 PM (ET

## 2020-11-21 NOTE — Telephone Encounter (Signed)
Spoke with patient and states she needed to speak with Sharyn Lull about her medication. Can you please call patient.

## 2020-11-21 NOTE — Telephone Encounter (Signed)
Spoke with patient and answered pt questions regarding Breztri refill. She needs to call [facility] to confirm delivery. Provided my number for follow up concerns.  Debbora Dus, PharmD Clinical Pharmacist [facility] at [facility] [phone]

## 2020-11-21 NOTE — Telephone Encounter (Addendum)
Patient called for update on Breztri refill. Contacted [facility]. After 1 hour on hold, representative confirmed medication would be shipped and arrive no later than Monday 11/25/20. PEP ID is #[id number].   Debbora Dus, PharmD Clinical Pharmacist [facility] at [facility] [phone]

## 2020-11-25 ENCOUNTER — Encounter: Payer: Self-pay | Admitting: Family Medicine

## 2020-11-25 ENCOUNTER — Other Ambulatory Visit: Payer: Self-pay

## 2020-11-25 ENCOUNTER — Ambulatory Visit (INDEPENDENT_AMBULATORY_CARE_PROVIDER_SITE_OTHER): Payer: Medicare Other | Admitting: Family Medicine

## 2020-11-25 VITALS — BP 120/84 | HR 102 | Temp 98.0°F | Ht 66.0 in | Wt 186.0 lb

## 2020-11-25 DIAGNOSIS — J439 Emphysema, unspecified: Secondary | ICD-10-CM

## 2020-11-25 DIAGNOSIS — F341 Dysthymic disorder: Secondary | ICD-10-CM

## 2020-11-25 DIAGNOSIS — Z0000 Encounter for general adult medical examination without abnormal findings: Secondary | ICD-10-CM

## 2020-11-25 DIAGNOSIS — I10 Essential (primary) hypertension: Secondary | ICD-10-CM | POA: Diagnosis not present

## 2020-11-25 DIAGNOSIS — Z7189 Other specified counseling: Secondary | ICD-10-CM

## 2020-11-25 DIAGNOSIS — E039 Hypothyroidism, unspecified: Secondary | ICD-10-CM

## 2020-11-25 DIAGNOSIS — R911 Solitary pulmonary nodule: Secondary | ICD-10-CM

## 2020-11-25 MED ORDER — ALPRAZOLAM 0.5 MG PO TABS: 0.2500 mg | ORAL_TABLET | Freq: Two times a day (BID) | ORAL | 3 refills | Status: DC | PRN

## 2020-11-25 NOTE — Patient Instructions (Addendum)
Try a white noise machine to mask the noise.   Take care.  Glad to see you.  Update me as needed.

## 2020-11-25 NOTE — Progress Notes (Signed)
This visit occurred during the SARS-CoV-2 public health emergency.  Safety protocols were in place, including screening questions prior to the visit, additional usage of staff PPE, and extensive cleaning of exam room while observing appropriate contact time as indicated for disinfecting solutions.  I have personally reviewed the Medicare Annual Wellness questionnaire and have noted 1. The patient's medical and social history 2. Their use of alcohol, tobacco or illicit drugs 3. Their current medications and supplements 4. The patient's functional ability including ADL's, fall risks, home safety risks and hearing or visual             impairment. 5. Diet and physical activities 6. Evidence for depression or mood disorders  The patients weight, height, BMI have been recorded in the chart and visual acuity is per eye clinic.  I have made referrals, counseling and provided education to the patient based review of the above and I have provided the pt with a written personalized care plan for preventive services.  Provider list updated- see scanned forms.  Routine anticipatory guidance given to patient.  See health maintenance. The possibility exists that previously documented standard health maintenance information may have been brought forward from a previous encounter into this note.  If needed, that same information has been updated to reflect the current situation based on today's encounter.    Flu 2022 Shingles discussed with patient.  First dose of Shingrix done 2022 PNA up-to-date Tetanus 2013 COVID-vaccine previously done Colonoscopy 2022 Breast cancer screening 2022 Bone density test pending for 2023 Advance directive-Kim Seaver designated first then Gery Pray if needed, if patient were incapacitated. Cognitive function addressed- see scanned forms- and if abnormal then additional documentation follows.   In addition to [facility]ness, follow up visit for the below  conditions:  Mood d/w pt.  Stressors d/w pt.  She is dealing with her neighbors, who have been playing music exceedingly loudly, waking her up at 2 in the morning, etc.  She had to call the cops in the meantime.  Still on wellbutrin at baseline, taking xanax prn.  She is trying to put up with the situation.  We talked about trying to get a masking device/white noise machine.    Hypothyroidism.  TSH wnl.  Still on replacement.  Labs d/w pt.  No neck mass.    She is still having trouble with L ankle rolling and has been using an ASO.  We talked about PT, she can consider that.  Fall cautions d/w pt. she will update me if she wants to go.  Chest CT re: lung nodules pending.  Discussed.  Hypertension:    Using medication without problems or lightheadedness: yes Chest pain with exertion:no Edema: occ, at the end of the day.   Short of breath:see below.  COPD.  She quit smoking 3 years ago.  I thanked her for her effort.  Sx worse since having covid.  D/w pt.  Compliant with meds.    PMH and SH reviewed  Meds, vitals, and allergies reviewed.   ROS: Per HPI.  Unless specifically indicated otherwise in HPI, the patient denies:  General: fever. Eyes: acute vision changes ENT: sore throat Cardiovascular: chest pain Respiratory: SOB GI: vomiting GU: dysuria Musculoskeletal: acute back pain Derm: acute rash Neuro: acute motor dysfunction Psych: worsening mood Endocrine: polydipsia Heme: bleeding Allergy: hayfever  GEN: nad, alert and oriented HEENT: ncat NECK: supple w/o LA CV: rrr. PULM: ctab, no inc wob ABD: soft, +bs EXT: no edema SKIN: no acute  rash

## 2020-11-26 ENCOUNTER — Ambulatory Visit: Payer: Medicare Other

## 2020-11-27 NOTE — Progress Notes (Signed)
Called patient to confirm shipment of Beztri. Patient received it yesterday 01/27/2020. Patient did not get full shipment. She only got two Beztri of three she normally receives. Advised patient I would call [employer] to find out why she was sent only two. Spoke with [employer]. They only sent two instead of three to finish out the 2022 PAP. I enrolled patient in auto-refill per patient request. Patient will receive her next shipment in January. Called patient back to inform her. No answer; left message with details.   Debbora Dus, CPP notified  Marijean Niemann, [place] Clinical Pharmacy Assistant [phone]  Time Spent:  10 Minutes

## 2020-12-01 NOTE — Assessment & Plan Note (Signed)
  Flu 2022 Shingles discussed with patient.  First dose of Shingrix done 2022 PNA up-to-date Tetanus 2013 COVID-vaccine previously done Colonoscopy 2022 Breast cancer screening 2022 Bone density test pending for 2023 Advance directive-Kim Seaver designated first then Gery Pray if needed, if patient were incapacitated. Cognitive function addressed- see scanned forms- and if abnormal then additional documentation follows.

## 2020-12-01 NOTE — Assessment & Plan Note (Signed)
Advance directive-April [name] designated first then April [name] if needed, if patient were incapacitated. 

## 2020-12-01 NOTE — Assessment & Plan Note (Signed)
Follow-up CT pending.

## 2020-12-01 NOTE — Assessment & Plan Note (Signed)
She quit smoking 3 years ago.  I thanked her for her effort.  Sx worse since having covid, meaning she had improved until she had COVID.  D/w pt.  Compliant with meds.  Would continue Breztri with as needed albuterol.

## 2020-12-01 NOTE — Assessment & Plan Note (Signed)
Labs discussed with patient.  Continue metoprolol and valsartan.

## 2020-12-01 NOTE — Assessment & Plan Note (Signed)
Continue levothyroxine 

## 2020-12-01 NOTE — Assessment & Plan Note (Signed)
Mood d/w pt.  Stressors d/w pt.  She is dealing with her neighbors, who have been playing music exceedingly loudly, waking her up at 2 in the morning, etc.  She had to call the cops in the meantime.  Still on wellbutrin at baseline, taking xanax prn.  She is trying to put up with the situation.  We talked about trying to get a masking device/white noise machine.  No adverse effect on medications.  Would continue Wellbutrin at baseline with Xanax as needed.

## 2020-12-02 ENCOUNTER — Encounter: Payer: Self-pay | Admitting: Gastroenterology

## 2020-12-04 ENCOUNTER — Other Ambulatory Visit: Payer: Self-pay

## 2020-12-04 ENCOUNTER — Encounter: Payer: Self-pay | Admitting: Gastroenterology

## 2020-12-04 ENCOUNTER — Ambulatory Visit (AMBULATORY_SURGERY_CENTER): Payer: Medicare Other | Admitting: Gastroenterology

## 2020-12-04 VITALS — BP 130/68 | HR 80 | Temp 97.1°F | Resp 16 | Ht 66.0 in | Wt 183.0 lb

## 2020-12-04 DIAGNOSIS — J449 Chronic obstructive pulmonary disease, unspecified: Secondary | ICD-10-CM | POA: Diagnosis not present

## 2020-12-04 DIAGNOSIS — Z86010 Personal history of colonic polyps: Secondary | ICD-10-CM

## 2020-12-04 DIAGNOSIS — K635 Polyp of colon: Secondary | ICD-10-CM

## 2020-12-04 DIAGNOSIS — K219 Gastro-esophageal reflux disease without esophagitis: Secondary | ICD-10-CM | POA: Diagnosis not present

## 2020-12-04 DIAGNOSIS — J45909 Unspecified asthma, uncomplicated: Secondary | ICD-10-CM | POA: Diagnosis not present

## 2020-12-04 DIAGNOSIS — D124 Benign neoplasm of descending colon: Secondary | ICD-10-CM

## 2020-12-04 DIAGNOSIS — D175 Benign lipomatous neoplasm of intra-abdominal organs: Secondary | ICD-10-CM | POA: Diagnosis not present

## 2020-12-04 DIAGNOSIS — D123 Benign neoplasm of transverse colon: Secondary | ICD-10-CM

## 2020-12-04 MED ORDER — SODIUM CHLORIDE 0.9 % IV SOLN: 500.0000 mL | Freq: Once | INTRAVENOUS | Status: DC

## 2020-12-04 NOTE — Progress Notes (Signed)
To PACU, VSS. Report to [email or web address] 

## 2020-12-04 NOTE — Op Note (Signed)
[facility] Patient Name: April [name] Procedure Date: 12/04/2020 2:54 PM MRN: [id number] Endoscopist: Mallie Mussel L. Loletha Carrow , MD Age: 65 Referring MD:  Date of Birth: 11-Sep-1955 Gender: Female Account #: [id number] Procedure:                Colonoscopy Indications:              High risk colon cancer surveillance: Personal                            history of sessile serrated colon polyp (10 mm or                            greater in size)                           Feb 2022 Diminutive cecal TA, large TC and DC SSPs                            removed cold piecemeal                           Sept 2018 62mm AC SSP Medicines:                Monitored Anesthesia Care Procedure:                Pre-Anesthesia Assessment:                           - Prior to the procedure, a History and Physical                            was performed, and patient medications and                            allergies were reviewed. The patient's tolerance of                            previous anesthesia was also reviewed. The risks                            and benefits of the procedure and the sedation                            options and risks were discussed with the patient.                            All questions were answered, and informed consent                            was obtained. Prior Anticoagulants: The patient has                            taken no previous anticoagulant or antiplatelet  agents. ASA Grade Assessment: III - A patient with                            severe systemic disease. After reviewing the risks                            and benefits, the patient was deemed in                            satisfactory condition to undergo the procedure.                           After obtaining informed consent, the colonoscope                            was passed under direct vision. Throughout the                            procedure, the patient's  blood pressure, pulse, and                            oxygen saturations were monitored continuously. The                            PCF-HQ190L Colonoscope was introduced through the                            anus and advanced to the the cecum, identified by                            appendiceal orifice and ileocecal valve. The                            colonoscopy was somewhat difficult due to multiple                            diverticula in the colon and a tortuous colon. The                            patient tolerated the procedure well. The quality                            of the bowel preparation was good with additional                            lavage, esp. of left colon. The ileocecal valve,                            appendiceal orifice, and rectum were photographed.                            The bowel preparation used was Plenvu. Scope In: 3:26:19 PM Scope Out: 4:16:27 PM Scope Withdrawal Time: 0 hours 45  minutes 21 seconds  Total Procedure Duration: 0 hours 50 minutes 8 seconds  Findings:                 The perianal and digital rectal examinations were                            normal.                           Two semi-sessile polyps were found in the hepatic                            flexure. The polyps were 2 to 8 mm in size. These                            polyps were removed with a cold snare. Resection                            and retrieval were complete. (Jar 1)                           A 8 mm polyp was found in the transverse colon. The                            polyp was semi-sessile and at the site of prior                            polypectomy (adjacent tattoo, mucosal fibrosis                            seen). The polyp was removed with a hot snare.                            Resection and retrieval were complete. (Jar 2)                           A 18 mm polyp was found in the distal transverse                            colon. The polyp was  semi-sessile. The polyp was                            removed piecemeal with a hot and cold snare.                            Resection and retrieval were complete. (Jar 3)                           A 10 mm polyp was found in the distal transverse                            colon. The polyp was semi-sessile. The polyp was  removed with a cold snare. Resection and retrieval                            were complete. (Jar 3)                           A 20 mm polyp was found in the descending colon.                            The polyp was semi-sessile. The polyp was removed                            with a piecemeal technique using a hot snare.                            Resection and retrieval were complete. (Jar 4)                           A 10 mm polyp was found in the descending colon.                            The polyp was semi-sessile. The polyp was removed                            with a cold snare. Resection and retrieval were                            complete. (Jar 4)                           A 8 mm polyp was found in the descending colon. The                            polyp was semi-sessile and at the site of prior                            polypectomy (adjacent tattoo, mucosal fibrosis                            seen). The polyp was removed with a hot snare.                            Resection and retrieval were complete.                           Multiple diverticula were found in the left colon.                           The exam was otherwise without abnormality on                            direct and retroflexion views. Complications:            No immediate  complications. Estimated Blood Loss:     Estimated blood loss was minimal. Impression:               - Two 2 to 8 mm polyps at the hepatic flexure,                            removed with a cold snare. Resected and retrieved.                           - One 8 mm polyp in the  transverse colon, removed                            with a hot snare. Resected and retrieved.                           - One 18 mm polyp in the distal transverse colon,                            removed with a hot snare. Resected and retrieved.                           - One 10 mm polyp in the distal transverse colon,                            removed with a cold snare. Resected and retrieved.                           - One 20 mm polyp in the descending colon, removed                            piecemeal using a hot snare. Resected and retrieved.                           - One 10 mm polyp in the descending colon, removed                            with a cold snare. Resected and retrieved.                           - One 8 mm polyp in the descending colon, removed                            with a hot snare. Resected and retrieved.                           - Diverticulosis in the left colon.                           - The examination was otherwise normal on direct                            and retroflexion views. Recommendation:           -  Patient has a contact number available for                            emergencies. The signs and symptoms of potential                            delayed complications were discussed with the                            patient. Return to normal activities tomorrow.                            Written discharge instructions were provided to the                            patient.                           - Resume previous diet.                           - Continue present medications.                           - Await pathology results.                           - Repeat colonoscopy in 6 months for surveillance.                            (double length scope slot and split dose golytely                            prep) Graydon Fofana L. Loletha Carrow, MD 12/04/2020 4:29:33 PM This report has been signed electronically.

## 2020-12-04 NOTE — Patient Instructions (Signed)
8 polyps removed- please read handouts about polyps and diverticulosis  Continue your normal medications  Next colonoscopy in 6 months   YOU HAD AN ENDOSCOPIC PROCEDURE TODAY AT [facility]:   Refer to the procedure report that was given to you for any specific questions about what was found during the examination.  If the procedure report does not answer your questions, please call your gastroenterologist to clarify.  If you requested that your care partner not be given the details of your procedure findings, then the procedure report has been included in a sealed envelope for you to review at your convenience later.  YOU SHOULD EXPECT: Some feelings of bloating in the abdomen. Passage of more gas than usual.  Walking can help get rid of the air that was put into your GI tract during the procedure and reduce the bloating. If you had a lower endoscopy (such as a colonoscopy or flexible sigmoidoscopy) you may notice spotting of blood in your stool or on the toilet paper. If you underwent a bowel prep for your procedure, you may not have a normal bowel movement for a few days.  Please Note:  You might notice some irritation and congestion in your nose or some drainage.  This is from the oxygen used during your procedure.  There is no need for concern and it should clear up in a day or so.  SYMPTOMS TO REPORT IMMEDIATELY:  Following lower endoscopy (colonoscopy or flexible sigmoidoscopy):  Excessive amounts of blood in the stool  Significant tenderness or worsening of abdominal pains  Swelling of the abdomen that is new, acute  Fever of 100F or higher  For urgent or emergent issues, a gastroenterologist can be reached at any hour by calling [phone]. Do not use MyChart messaging for urgent concerns.    DIET:  We do recommend a small meal at first, but then you may proceed to your regular diet.  Drink plenty of fluids but you should avoid alcoholic beverages for 24  hours.  ACTIVITY:  You should plan to take it easy for the rest of today and you should NOT DRIVE or use heavy machinery until tomorrow (because of the sedation medicines used during the test).    FOLLOW UP: Our staff will call the number listed on your records 48-72 hours following your procedure to check on you and address any questions or concerns that you may have regarding the information given to you following your procedure. If we do not reach you, we will leave a message.  We will attempt to reach you two times.  During this call, we will ask if you have developed any symptoms of COVID 19. If you develop any symptoms (ie: fever, flu-like symptoms, shortness of breath, cough etc.) before then, please call [phone].  If you test positive for Covid 19 in the 2 weeks post procedure, please call and report this information to [place].    If any biopsies were taken you will be contacted by phone or by letter within the next 1-3 weeks.  Please call us at [phone] if you have not heard about the biopsies in 3 weeks.    SIGNATURES/CONFIDENTIALITY: You and/or your care partner have signed paperwork which will be entered into your electronic medical record.  These signatures attest to the fact that that the information above on your After Visit Summary has been reviewed and is understood.  Full responsibility of the confidentiality of this discharge information lies with you  and/or your care-partner.

## 2020-12-04 NOTE — Progress Notes (Signed)
Pt's states no medical or surgical changes since previsit or office visit. 

## 2020-12-04 NOTE — Progress Notes (Signed)
History and Physical:  This patient presents for endoscopic testing for: Encounter Diagnosis  Name Primary?   Personal history of colonic polyps Yes    Large SSPs and a small TA Feb '22  ROS: Patient denies chest pain or cough   Past Medical History: Past Medical History:  Diagnosis Date   Allergy    Anemia    Anxiety    Anxiety and depression    related to caring for mother during terminal illness   Arthritis    Asthma    AVN (avascular necrosis of bone) ([place])    hip and wrist   Bronchitis    hx of   Cataract    Chronic kidney disease    COPD (chronic obstructive pulmonary disease) ([place])    Depression    Emphysema of lung ([place])    Endometriosis    FH: CAD (coronary artery disease)    GERD (gastroesophageal reflux disease)    ocassional   History of blood transfusion    after hip repackment - broke out in hives and started itching   History of palpitations    Hypertension    Hypothyroid    Non-ischemic cardiomyopathy ([place]) 2019   Osteopenia    forteo through Dr. Estanislado Pandy (started 10/12)   PMR (polymyalgia rheumatica) ([place])    SIRS (systemic inflammatory response syndrome) ([place]) 10/2017   Smoker    SOB (shortness of breath) on exertion    Squamous cell carcinoma    facial, 2016   Temporal arteritis ([place])    s/p prednisone taper     Past Surgical History: Past Surgical History:  Procedure Laterality Date   ABDOMINAL ADHESION SURGERY     ABDOMINAL HYSTERECTOMY     BREAST EXCISIONAL BIOPSY Left    BREAST REDUCTION SURGERY Bilateral 06/13/2019   Procedure: MAMMARY REDUCTION  (BREAST);  Surgeon: Cindra Presume, MD;  Location: [facility];  Service: Plastics;  Laterality: Bilateral;   BREAST SURGERY Left    benign bx 1990   CARDIAC CATHETERIZATION  2007   no PCI   CHOLECYSTECTOMY     COLONOSCOPY W/ POLYPECTOMY     INCONTINENCE SURGERY     2007    JOINT REPLACEMENT Left 2012   left hip   REDUCTION MAMMAPLASTY     2021   REVERSE SHOULDER ARTHROPLASTY Left  05/28/2020   Procedure: REVERSE SHOULDER ARTHROPLASTY;  Surgeon: Nicholes Stairs, MD;  Location: [facility];  Service: Orthopedics;  Laterality: Left;  2.5 hrs   TONSILLECTOMY     TOTAL HIP ARTHROPLASTY Right 10/04/2012   Procedure: TOTAL HIP ARTHROPLASTY;  Surgeon: Garald Balding, MD;  Location: [facility];  Service: Orthopedics;  Laterality: Right;   WRIST SURGERY Left    2012/ left wrist/ bone removed due to necrosis    Allergies: Allergies  Allergen Reactions   Sulfonamide Derivatives     As a child.  Throat closed, rash.   Alendronate Sodium     GI upset   Dilaudid [Hydromorphone Hcl] Nausea And Vomiting   Wound Dressing Adhesive     Redness, adhesive tapes. Needs PAPER TAPE.    Outpatient Meds: Current Outpatient Medications  Medication Sig Dispense Refill   albuterol (VENTOLIN HFA) 108 (90 Base) MCG/ACT inhaler Inhale 2 puffs into the lungs every 6 (six) hours as needed for wheezing or shortness of breath. Okay to dispense proair/Ventolin/albuterol. 18 g 11   ALPRAZolam (XANAX) 0.5 MG tablet Take 0.5-1 tablets (0.25-0.5 mg total) by mouth 2 (two) times daily as needed.  for anxiety 60 tablet 3   Budeson-Glycopyrrol-Formoterol (BREZTRI AEROSPHERE) 160-9-4.8 MCG/ACT AERO Inhale 2 puffs into the lungs every 12 (twelve) hours. 32.1 g 3   buPROPion (WELLBUTRIN XL) 300 MG 24 hr tablet TAKE 1 TABLET BY MOUTH  DAILY 90 tablet 3   Calcium 500 MG CHEW Chew 500 mg by mouth 2 (two) times daily.      Cholecalciferol (DIALYVITE VITAMIN D 5000) 125 MCG (5000 UT) capsule 1 tab a day except for 2 on Sundays (Patient taking differently: Take 5,000-10,000 Units by mouth See admin instructions. 5000 units a day except for 10,000 units on Sundays)     fluticasone (FLONASE) 50 MCG/ACT nasal spray Place 2 sprays into both nostrils daily as needed for allergies or rhinitis.     levothyroxine (SYNTHROID) 125 MCG tablet TAKE 1 TABLET BY MOUTH  DAILY EXCEPT TAKE 1 AND 1/2 TABLETS BY MOUTH ON SUNDAY 98 tablet  3   metoprolol succinate (TOPROL-XL) 25 MG 24 hr tablet TAKE 1 TABLET BY MOUTH  DAILY 90 tablet 3   Spacer/Aero-Holding Chambers (AEROCHAMBER MV) inhaler Use as instructed 1 each 0   tiZANidine (ZANAFLEX) 4 MG tablet TAKE 1/2 TO 1 TABLET BY  MOUTH EVERY 6 HOURS AS  NEEDED FOR MUSCLE SPASMS 90 tablet 0   valsartan (DIOVAN) 40 MG tablet TAKE 1 TABLET BY MOUTH  DAILY 90 tablet 3   vitamin C (ASCORBIC ACID) 250 MG tablet Take 500 mg by mouth daily.     acetaminophen (TYLENOL) 500 MG tablet Take 1,000 mg by mouth every 6 (six) hours as needed for moderate pain or headache.     meclizine (ANTIVERT) 25 MG tablet Take 0.5-1 tablets (12.5-25 mg total) by mouth 3 (three) times daily as needed for dizziness. 90 tablet 1   Multiple Vitamins-Minerals (MULTIVITAMIN WITH MINERALS) tablet Take 1 tablet by mouth daily.     Polyethyl Glycol-Propyl Glycol (SYSTANE OP) Place 1 drop into both eyes every morning.     Current Facility-Administered Medications  Medication Dose Route Frequency Provider Last Rate Last Admin   0.9 %  sodium chloride infusion  500 mL Intravenous Once Nelida Meuse III, MD          ___________________________________________________________________ Objective   Exam:  BP (!) 141/97   Pulse 100   Temp (!) 97.1 F (36.2 C)   Ht 5\' 6"  (1.676 m)   Wt 183 lb (83 kg)   SpO2 95%   BMI 29.54 kg/m   CV: RRR without murmur, S1/S2 Resp: clear to auscultation bilaterally, normal RR and effort noted GI: soft, no tenderness, with active bowel sounds.   Assessment: Encounter Diagnosis  Name Primary?   Personal history of colonic polyps Yes     Plan: Colonoscopy  The benefits and risks of the planned procedure were described in detail with the patient or (when appropriate) their health care proxy.  Risks were outlined as including, but not limited to, bleeding, infection, perforation, adverse medication reaction leading to cardiac or pulmonary decompensation, pancreatitis (if  ERCP).  The limitation of incomplete mucosal visualization was also discussed.  No guarantees or warranties were given.    The patient is appropriate for an endoscopic procedure in the ambulatory setting.   - Wilfrid Lund, MD

## 2020-12-04 NOTE — Progress Notes (Signed)
VS taken by DT 

## 2020-12-04 NOTE — Progress Notes (Signed)
Called to room to assist during endoscopic procedure.  Patient ID and intended procedure confirmed with present staff. Received instructions for my participation in the procedure from the performing physician.  

## 2020-12-06 ENCOUNTER — Telehealth: Payer: Self-pay | Admitting: *Deleted

## 2020-12-06 NOTE — Telephone Encounter (Signed)
  Follow up Call-  Call back number 12/04/2020 02/26/2020  Post procedure Call Back phone  # [phone]  Permission to leave phone message Yes Yes  Some recent data might be hidden     Patient questions:  Do you have a fever, pain , or abdominal swelling? No. Pain Score  0 *  Have you tolerated food without any problems? Yes.    Have you been able to return to your normal activities? Yes.    Do you have any questions about your discharge instructions: Diet   No. Medications  No. Follow up visit  No.  Do you have questions or concerns about your Care? No.  Actions: * If pain score is 4 or above: No action needed, pain <4.  Have you developed a fever since your procedure? no  2.   Have you had an respiratory symptoms (SOB or cough) since your procedure? no  3.   Have you tested positive for COVID 19 since your procedure no  4.   Have you had any family members/close contacts diagnosed with the COVID 19 since your procedure?  no   If yes to any of these questions please route to Joylene John, RN and Joella Prince, RN

## 2020-12-09 ENCOUNTER — Telehealth: Payer: Self-pay

## 2020-12-09 NOTE — Progress Notes (Addendum)
Chronic Care Management Pharmacy Assistant   Name: April [name]  MRN: [id number] DOB: 1955/08/05  Reason for Encounter: CCM (Hypertension Disease State)   Recent office visits:  11/25/2020 - Elsie Stain, MD - Patient presented for [facility] Annual Wellness visit. No medication changes.   Recent consult visits:  12/04/2020 - Gastroenterology - Patient presented for Endoscopic testing due to personal history of colonic polyps.   Hospital visits:  None sine last CCM contact  Medications: Outpatient Encounter Medications as of 12/09/2020  Medication Sig   acetaminophen (TYLENOL) 500 MG tablet Take 1,000 mg by mouth every 6 (six) hours as needed for moderate pain or headache.   albuterol (VENTOLIN HFA) 108 (90 Base) MCG/ACT inhaler Inhale 2 puffs into the lungs every 6 (six) hours as needed for wheezing or shortness of breath. Okay to dispense proair/Ventolin/albuterol.   ALPRAZolam (XANAX) 0.5 MG tablet Take 0.5-1 tablets (0.25-0.5 mg total) by mouth 2 (two) times daily as needed. for anxiety   Budeson-Glycopyrrol-Formoterol (BREZTRI AEROSPHERE) 160-9-4.8 MCG/ACT AERO Inhale 2 puffs into the lungs every 12 (twelve) hours.   buPROPion (WELLBUTRIN XL) 300 MG 24 hr tablet TAKE 1 TABLET BY MOUTH  DAILY   Calcium 500 MG CHEW Chew 500 mg by mouth 2 (two) times daily.    Cholecalciferol (DIALYVITE VITAMIN D 5000) 125 MCG (5000 UT) capsule 1 tab a day except for 2 on Sundays (Patient taking differently: Take 5,000-10,000 Units by mouth See admin instructions. 5000 units a day except for 10,000 units on Sundays)   fluticasone (FLONASE) 50 MCG/ACT nasal spray Place 2 sprays into both nostrils daily as needed for allergies or rhinitis.   levothyroxine (SYNTHROID) 125 MCG tablet TAKE 1 TABLET BY MOUTH  DAILY EXCEPT TAKE 1 AND 1/2 TABLETS BY MOUTH ON SUNDAY   meclizine (ANTIVERT) 25 MG tablet Take 0.5-1 tablets (12.5-25 mg total) by mouth 3 (three) times daily as needed for dizziness.    metoprolol succinate (TOPROL-XL) 25 MG 24 hr tablet TAKE 1 TABLET BY MOUTH  DAILY   Multiple Vitamins-Minerals (MULTIVITAMIN WITH MINERALS) tablet Take 1 tablet by mouth daily.   Polyethyl Glycol-Propyl Glycol (SYSTANE OP) Place 1 drop into both eyes every morning.   Spacer/Aero-Holding Chambers (AEROCHAMBER MV) inhaler Use as instructed   tiZANidine (ZANAFLEX) 4 MG tablet TAKE 1/2 TO 1 TABLET BY  MOUTH EVERY 6 HOURS AS  NEEDED FOR MUSCLE SPASMS   valsartan (DIOVAN) 40 MG tablet TAKE 1 TABLET BY MOUTH  DAILY   vitamin C (ASCORBIC ACID) 250 MG tablet Take 500 mg by mouth daily.   No facility-administered encounter medications on file as of 12/09/2020.   Recent Office Vitals: BP Readings from Last 3 Encounters:  12/04/20 130/68  11/25/20 120/84  11/04/20 133/87   Pulse Readings from Last 3 Encounters:  12/04/20 80  11/25/20 (!) 102  11/04/20 93    Wt Readings from Last 3 Encounters:  12/04/20 183 lb (83 kg)  11/25/20 186 lb (84.4 kg)  11/20/20 183 lb (83 kg)    Kidney Function Lab Results  Component Value Date/Time   CREATININE 1.15 (H) 09/26/2020 07:41 PM   CREATININE 1.42 (H) 05/24/2020 02:10 PM   CREATININE 1.13 (H) 06/03/2016 12:13 PM   GFR 41.29 (L) 08/25/2019 08:15 AM   GFRNONAA 52 (L) 09/26/2020 07:41 PM   GFRNONAA 52 (L) 06/03/2016 12:13 PM   GFRAA 50 (L) 06/13/2019 09:55 AM   GFRAA 60 06/03/2016 12:13 PM   BMP Latest Ref Rng & Units 09/26/2020 05/24/2020  08/25/2019  Glucose 70 - 99 mg/dL 125(H) 141(H) 104(H)  BUN 8 - 23 mg/dL 12 18 19   Creatinine 0.44 - 1.00 mg/dL 1.15(H) 1.42(H) 1.29(H)  BUN/Creat Ratio 12 - 28 - - -  Sodium 135 - 145 mmol/L 138 135 140  Potassium 3.5 - 5.1 mmol/L 3.7 3.9 4.3  Chloride 98 - 111 mmol/L 103 102 104  CO2 22 - 32 mmol/L 26 23 28   Calcium 8.9 - 10.3 mg/dL 9.4 9.3 9.8   Contacted patient on 12/12/2020 to discuss hypertension disease state  Current antihypertensive regimen:  Metoprolol succinate 25 mg - 1 tablet daily at  night Valsartan 40 mg - 1 tablet daily in morning  Patient verbally confirms she is taking the above medications as directed. Yes  How often are you checking your Blood Pressure? 1-2x per week 12/12/20 - 147/84 Pulse 82  I asked patient to take her blood pressure daily and record it. Advised I will call back on 12/20/20 in the afternoon to collect her readings.   she checks her blood pressure in the morning before taking her medication (Valsartan)  Wrist or arm cuff: Arm Caffeine intake: Drinks decaf coffee every morning 1-2 cups - Sweet tea on occasion Salt intake: Patient states she has been watching her salt intake closer than before.  OTC medications including pseudoephedrine or NSAIDs? Tylenol - 2 tablets 650 mg - Takes every day at night.  Any readings above 180/120? Yes - [employer] three weeks ago If yes any symptoms of hypertensive emergency? chest pain, neck pain and dizzy.  What recent interventions/DTPs have been made by any provider to improve Blood Pressure control since last CPP Visit: No recent interventions; Continue metoprolol and valsartan.   Any recent hospitalizations or ED visits since last visit with CPP? No  What diet changes have been made to improve Blood Pressure Control?  Patient has been following a heart healthy diet when she is cooking.   What exercise is being done to improve your Blood Pressure Control?  Patient is trying to get back in to Yoga (chair).   Adherence Review: Is the patient currently on ACE/ARB medication? Yes Does the patient have >5 day gap between last estimated fill dates? No  Star Rating Drugs:  Medication:  Last Fill: Day Supply Valsartan 40 mg 10/28/2020 90 Verified with [facility].   Care Gaps: Annual wellness visit in last year? Yes 11/25/2020 Most Recent BP reading: 130/68 on 12/04/2020   Upcoming appts: Pulmonology - Video Visit on 12/11/2020  Debbora Dus, CPP notified  Marijean Niemann, [place] [phone]  Time Spent:  40 min  I have reviewed the care management and care coordination activities outlined in this encounter and I am certifying that I agree with the content of this note. No further action required.  Debbora Dus, PharmD Clinical Pharmacist [facility] at [facility] [phone]

## 2020-12-11 ENCOUNTER — Other Ambulatory Visit: Payer: Self-pay

## 2020-12-11 ENCOUNTER — Ambulatory Visit
Admission: RE | Admit: 2020-12-11 | Discharge: 2020-12-11 | Disposition: A | Discharge status: Home / Self Care | Payer: Medicare Other | Source: Ambulatory Visit | Attending: Acute Care | Admitting: Acute Care

## 2020-12-11 ENCOUNTER — Telehealth: Payer: Medicare Other | Admitting: Acute Care

## 2020-12-11 ENCOUNTER — Encounter: Payer: Self-pay | Admitting: Acute Care

## 2020-12-11 DIAGNOSIS — Z87891 Personal history of nicotine dependence: Secondary | ICD-10-CM

## 2020-12-11 DIAGNOSIS — I2510 Atherosclerotic heart disease of native coronary artery without angina pectoris: Secondary | ICD-10-CM | POA: Diagnosis not present

## 2020-12-11 DIAGNOSIS — M47814 Spondylosis without myelopathy or radiculopathy, thoracic region: Secondary | ICD-10-CM | POA: Diagnosis not present

## 2020-12-11 DIAGNOSIS — J432 Centrilobular emphysema: Secondary | ICD-10-CM | POA: Diagnosis not present

## 2020-12-11 NOTE — Patient Instructions (Signed)
Thank you for participating in the [facility]. °It was our pleasure to meet you today. °We will call you with the results of your scan within the next few days. °Your scan will be assigned a Lung RADS category score by the physicians reading the scans.  °This Lung RADS score determines follow up scanning.  °See below for description of categories, and follow up screening recommendations. °We will be in touch to schedule your follow up screening annually or based on recommendations of our providers. °We will fax a copy of your scan results to your Primary Care Physician, or the physician who referred you to the program, to ensure they have the results. °Please call the office if you have any questions or concerns regarding your scanning experience or results.  °Our office number is [phone]. °Please speak with Denise Phelps, RN. She is our Lung Cancer Screening RN. °If she is unavailable when you call, please have the office staff send her a message. She will return your call at her earliest convenience. °Remember, if your scan is normal, we will scan you annually as long as you continue to meet the criteria for the program. (Age 55-77, Current smoker or smoker who has quit within the last 15 years). °If you are a smoker, remember, quitting is the single most powerful action that you can take to decrease your risk of lung cancer and other pulmonary, breathing related problems. °We know quitting is hard, and we are here to help.  °Please let us know if there is anything we can do to help you meet your goal of quitting. °If you are a former smoker, congratulations. We are proud of you! Remain smoke free! °Remember you can refer friends or family members through the number above.  °We will screen them to make sure they meet criteria for the program. °Thank you for helping us take better care of you by participating in Lung Screening. ° °You can receive free nicotine replacement therapy  ( patches, gum or mints) by calling 1-800-QUIT NOW. Please call so we can get you on the path to becoming  a non-smoker. I know it is hard, but you can do this! ° °Lung RADS Categories: ° °Lung RADS 1: no nodules or definitely non-concerning nodules.  °Recommendation is for a repeat annual scan in 12 months. ° °Lung RADS 2:  nodules that are non-concerning in appearance and behavior with a very low likelihood of becoming an active cancer. °Recommendation is for a repeat annual scan in 12 months. ° °Lung RADS 3: nodules that are probably non-concerning , includes nodules with a low likelihood of becoming an active cancer.  Recommendation is for a 6-month repeat screening scan. Often noted after an upper respiratory illness. We will be in touch to make sure you have no questions, and to schedule your 6-month scan. ° °Lung RADS 4 A: nodules with concerning findings, recommendation is most often for a follow up scan in 3 months or additional testing based on our provider's assessment of the scan. We will be in touch to make sure you have no questions and to schedule the recommended 3 month follow up scan. ° °Lung RADS 4 B:  indicates findings that are concerning. We will be in touch with you to schedule additional diagnostic testing based on our provider's  assessment of the scan. ° °Hypnosis for smoking cessation  °[employer] °[phone] ° °Acupuncture for smoking cessation  °[facility] °[phone]  °

## 2020-12-11 NOTE — Progress Notes (Signed)
Virtual Visit via Video Note  I connected with April [name] on 12/11/20 at 10:00 AM EST by a video enabled telemedicine application and verified that I am speaking with the correct person using two identifiers.  Location: Patient: At home Provider: [place], [place], [place], [address]    I discussed the limitations of evaluation and management by telemedicine and the availability of in person appointments. The patient expressed understanding and agreed to proceed.   Shared Decision Making Visit Lung Cancer Screening Program [phone])   Eligibility: Age 65 y.o. Pack Years Smoking History Calculation 33 pack years (# packs/per year x # years smoked) Recent History of coughing up blood  no Unexplained weight loss? no ( >Than 15 pounds within the last 6 months ) Prior History Lung / other cancer no (Diagnosis within the last 5 years already requiring surveillance chest CT Scans). Smoking Status Former Smoker Former Smokers: Years since quit:  NA  Quit Date: NA  Visit Components: Discussion included one or more decision making aids. yes Discussion included risk/benefits of screening. yes Discussion included potential follow up diagnostic testing for abnormal scans. yes Discussion included meaning and risk of over diagnosis. yes Discussion included meaning and risk of False Positives. yes Discussion included meaning of total radiation exposure. yes  Counseling Included: Importance of adherence to annual lung cancer LDCT screening. yes Impact of comorbidities on ability to participate in the program. yes Ability and willingness to under diagnostic treatment. yes  Smoking Cessation Counseling: Current Smokers:  Discussed importance of smoking cessation. yes Information about tobacco cessation classes and interventions provided to patient. yes Patient provided with "ticket" for LDCT Scan. yes Symptomatic Patient. no  Counseling Diagnosis Code: Tobacco Use  Z72.0 Asymptomatic Patient yes  Counseling (Intermediate counseling: > three minutes counseling) [id number] Former Smokers:  Discussed the importance of maintaining cigarette abstinence. yes Diagnosis Code: Personal History of Nicotine Dependence. Q67.619 Information about tobacco cessation classes and interventions provided to patient. Yes Patient provided with "ticket" for LDCT Scan. yes Written Order for Lung Cancer Screening with LDCT placed in Epic. Yes (CT Chest Lung Cancer Screening Low Dose W/O CM) [id number] Z12.2-Screening of respiratory organs Z87.891-Personal history of nicotine dependence  I spent 25 minutes of face to face time/virtual visit time  with April [name] discussing the risks and benefits of lung cancer screening. We took the time to pause the power point at intervals to allow for questions to be asked and answered to ensure understanding. We discussed that she had taken the single most powerful action possible to decrease her risk of developing lung cancer when she quit smoking. I counseled her to remain smoke free, and to contact me if she ever had the desire to smoke again so that I can provide resources and tools to help support the effort to remain smoke free. We discussed the time and location of the scan, and that either  Doroteo Glassman RN, Joella Prince, RN or I  or I will call / send a letter with the results within  24-72 hours of receiving them. She has the office contact information in the event she needs to speak with me,  She verbalized understanding of all of the above and had no further questions upon leaving the office.     I explained to the patient that there has been a high incidence of coronary artery disease noted on these exams. I explained that this is a non-gated exam therefore degree or severity cannot be determined. This  patient is not on statin therapy. I have asked the patient to follow-up with their PCP regarding any incidental finding of coronary artery  disease and management with diet or medication as they feel is clinically indicated. The patient verbalized understanding of the above and had no further questions.     Magdalen Spatz, NP 12/11/2020

## 2020-12-12 ENCOUNTER — Encounter: Payer: Self-pay | Admitting: Gastroenterology

## 2020-12-20 ENCOUNTER — Other Ambulatory Visit: Payer: Self-pay

## 2020-12-20 ENCOUNTER — Encounter: Payer: Self-pay | Admitting: Pulmonary Disease

## 2020-12-20 ENCOUNTER — Ambulatory Visit: Payer: Medicare Other | Admitting: Pulmonary Disease

## 2020-12-20 VITALS — BP 130/80 | HR 79 | Temp 97.7°F | Ht 66.0 in | Wt 180.6 lb

## 2020-12-20 DIAGNOSIS — I5020 Unspecified systolic (congestive) heart failure: Secondary | ICD-10-CM | POA: Diagnosis not present

## 2020-12-20 DIAGNOSIS — R0609 Other forms of dyspnea: Secondary | ICD-10-CM | POA: Diagnosis not present

## 2020-12-20 DIAGNOSIS — Z87891 Personal history of nicotine dependence: Secondary | ICD-10-CM | POA: Diagnosis not present

## 2020-12-20 DIAGNOSIS — J449 Chronic obstructive pulmonary disease, unspecified: Secondary | ICD-10-CM | POA: Diagnosis not present

## 2020-12-20 MED ORDER — AZITHROMYCIN 250 MG PO TABS: 250.0000 mg | ORAL_TABLET | ORAL | 2 refills | Status: AC

## 2020-12-20 MED ORDER — ALBUTEROL SULFATE (2.5 MG/3ML) 0.083% IN NEBU: 2.5000 mg | INHALATION_SOLUTION | Freq: Four times a day (QID) | RESPIRATORY_TRACT | 12 refills | Status: DC | PRN
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
## 2020-12-20 NOTE — Progress Notes (Signed)
Called patient to get her blood pressure readings.  Date  Time  Blood Pressure   Pulse 12/16  6:10 am 132/81    86  12/15  8:48 am 163/104   102   6:58 pm 121/63    75  12/14  7:04 am 148/84    82   1:10 pm 151/92    85   Patient states she had a headache this day.  12/13  12:58 am 100/58    91  12/12  ------------ 121/78    97  12/11  ------------ 121/91    98  12/10  ------------ 129/81    72  12/09  ----------- 138/76    [place], CPP notified  Marijean Niemann, [place] Clinical Pharmacy Assistant [phone]  Time Spent:  15 Minutes

## 2020-12-20 NOTE — Patient Instructions (Addendum)
Thank you for visiting Dr. Valeta Harms at [facility]. Today we recommend the following:   Meds ordered this encounter  Medications   albuterol (PROVENTIL) (2.5 MG/3ML) 0.083% nebulizer solution    Sig: Take 3 mLs (2.5 mg total) by nebulization every 6 (six) hours as needed for wheezing or shortness of breath.    Dispense:  75 mL    Refill:  12   azithromycin (ZITHROMAX) 250 MG tablet    Sig: Take 1 tablet (250 mg total) by mouth every Monday, Wednesday, and Friday.    Dispense:  36 tablet    Refill:  2   Return in about 6 months (around 06/20/2021) for with APP or Dr. Valeta Harms.    Please do your part to reduce the spread of COVID-19.

## 2020-12-20 NOTE — Telephone Encounter (Signed)
Home BP controlled.  Debbora Dus, PharmD Clinical Pharmacist Practitioner [facility] at [facility] [phone]

## 2020-12-25 ENCOUNTER — Telehealth: Payer: Self-pay | Admitting: Family Medicine

## 2020-12-25 ENCOUNTER — Other Ambulatory Visit: Payer: Self-pay | Admitting: Acute Care

## 2020-12-25 DIAGNOSIS — Z87891 Personal history of nicotine dependence: Secondary | ICD-10-CM

## 2020-12-25 NOTE — Telephone Encounter (Signed)
Please call pt.  Benign findings on chest CT but she has incidental coronary artery calcifications noted.  Treatment is usually starting a statin.  Please let me know if she is willing to give that a try.  Please make sure to ask if she is having chest pain.  If she is having chest pain then we need to get her set up with cardiology.  Thanks.

## 2020-12-26 NOTE — Telephone Encounter (Signed)
Pt notified as instructed and pt voiced understanding. Pt wanted simple explanation of what coronary artery calcifications were and I explained to pt and she voiced understanding. Pt does not want to try a statin because her x husband tried taking statin and had a lot of problems including breaking out with rash on arms that left scars. Pt said she does have CP that starts in mid chest as a dull pain that can get sharpe. Pt said the CP seems to happen and is worse when she becomes anxious. Pt said recently she has been having a lot of problems with her neighbors and that has increased her anxiety. Pt said she already has a 1 yr FU with Dr Quay Burow on 02/07/21 and will that be soon enough to see cardiologist. Pt is requesting cb after Dr Damita Dunnings reviews this note. Pt also wants Dr Damita Dunnings to contact Dr Gwenlyn Found prior to pt appt on 02/07/21 so that the card will be aware of pts needs in regards to CP.  Sending note to Dr Damita Dunnings.

## 2020-12-27 NOTE — Telephone Encounter (Addendum)
Lorretta Harp, MD  You 31 minutes ago (10:12 AM)   Phillip Heal, it looks like Dr. Al Pimple, my partner, is her primary cardiologist.  If she has calcium on her chest CT but not having chest pain but I would wait for her scheduled appointment with Dr. Harrell Gave unless she for some reason prefers to see me.  Always happy to help.   Jonathan    =================== Noted. Thanks.  Routed to Dr. Harrell Gave as FYI in the meantime.

## 2020-12-27 NOTE — Telephone Encounter (Addendum)
I routed this to Dr. Gwenlyn Found as Juluis Rainier.  If she has exertional chest pain or if the chest pain is overall getting worse, then she needs eval for that and I wouldn't wait on the appointment with Dr. Gwenlyn Found.  It sound like her husband had atypical issues with statin use and I wouldn't expect her to have the same troubles.  I'll defer to her and cardiology.  I thank all involved.

## 2020-12-27 NOTE — Telephone Encounter (Signed)
Discussed everything below with patient. Chest pain does not happen often and did advised patient to seek urgent eval if becomes worse over the holiday weekend. Patient thinks this has been due to stress as she has been under a lot of stress lately.
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
## 2021-01-14 ENCOUNTER — Other Ambulatory Visit: Payer: Self-pay

## 2021-01-14 ENCOUNTER — Ambulatory Visit: Payer: Medicare Other | Admitting: Rheumatology

## 2021-01-14 ENCOUNTER — Encounter: Payer: Self-pay | Admitting: Rheumatology

## 2021-01-14 VITALS — BP 157/92 | HR 105 | Resp 16 | Ht 66.0 in | Wt 181.0 lb

## 2021-01-14 DIAGNOSIS — Z8781 Personal history of (healed) traumatic fracture: Secondary | ICD-10-CM

## 2021-01-14 DIAGNOSIS — Z8709 Personal history of other diseases of the respiratory system: Secondary | ICD-10-CM | POA: Diagnosis not present

## 2021-01-14 DIAGNOSIS — M5136 Other intervertebral disc degeneration, lumbar region: Secondary | ICD-10-CM

## 2021-01-14 DIAGNOSIS — Z8639 Personal history of other endocrine, nutritional and metabolic disease: Secondary | ICD-10-CM

## 2021-01-14 DIAGNOSIS — Z87891 Personal history of nicotine dependence: Secondary | ICD-10-CM | POA: Diagnosis not present

## 2021-01-14 DIAGNOSIS — F419 Anxiety disorder, unspecified: Secondary | ICD-10-CM

## 2021-01-14 DIAGNOSIS — Z96612 Presence of left artificial shoulder joint: Secondary | ICD-10-CM

## 2021-01-14 DIAGNOSIS — I10 Essential (primary) hypertension: Secondary | ICD-10-CM | POA: Diagnosis not present

## 2021-01-14 DIAGNOSIS — F32A Depression, unspecified: Secondary | ICD-10-CM

## 2021-01-14 DIAGNOSIS — U071 COVID-19: Secondary | ICD-10-CM

## 2021-01-14 DIAGNOSIS — M810 Age-related osteoporosis without current pathological fracture: Secondary | ICD-10-CM

## 2021-01-14 DIAGNOSIS — Z96643 Presence of artificial hip joint, bilateral: Secondary | ICD-10-CM | POA: Diagnosis not present

## 2021-01-14 DIAGNOSIS — M353 Polymyalgia rheumatica: Secondary | ICD-10-CM | POA: Diagnosis not present

## 2021-01-14 DIAGNOSIS — R2689 Other abnormalities of gait and mobility: Secondary | ICD-10-CM

## 2021-01-14 DIAGNOSIS — M316 Other giant cell arteritis: Secondary | ICD-10-CM

## 2021-01-15 ENCOUNTER — Telehealth: Payer: Self-pay | Admitting: Family Medicine

## 2021-01-15 ENCOUNTER — Telehealth (HOSPITAL_BASED_OUTPATIENT_CLINIC_OR_DEPARTMENT_OTHER): Payer: Self-pay

## 2021-01-15 ENCOUNTER — Encounter (HOSPITAL_BASED_OUTPATIENT_CLINIC_OR_DEPARTMENT_OTHER): Payer: Self-pay

## 2021-01-15 NOTE — Telephone Encounter (Signed)
Called patient to follow up on her mychart message.   Was able to get patient rescheduled for January 27th at 0840  with Dr. Harrell Gave. Patient states she is not having any active chest pain, but endorses having some occasionally  that comes with stress.

## 2021-01-15 NOTE — Telephone Encounter (Signed)
Please make sure it printed and I'll sign it.  Thanks.

## 2021-01-15 NOTE — Telephone Encounter (Signed)
Letter done. Thanks.
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
## 2021-01-16 NOTE — Telephone Encounter (Signed)
Spoke with patient and notified her letter was done. She is going to print letter from [place]. Advised if she has any trouble getting it printed to let me know and we can print it off for her.

## 2021-01-21 ENCOUNTER — Other Ambulatory Visit: Payer: Self-pay

## 2021-01-21 ENCOUNTER — Telehealth: Payer: Self-pay | Admitting: Pulmonary Disease

## 2021-01-21 MED ORDER — BREZTRI AEROSPHERE 160-9-4.8 MCG/ACT IN AERO: 2.0000 | INHALATION_SPRAY | Freq: Two times a day (BID) | RESPIRATORY_TRACT | 0 refills | Status: DC

## 2021-01-21 NOTE — Telephone Encounter (Signed)
Patient Assistance Program and/or prescriptions refills for inhalers and non-spec meds are not handled by Specialty Pharmacy team. CMA be advised.

## 2021-01-21 NOTE — Telephone Encounter (Signed)
Was placed on extended hold with [employer]. Will attempt to contact them at later time. Updated pt who states she only has a few days left on current Breztri. Placed samples up front for her pick up. Pt stated understanding.

## 2021-01-31 ENCOUNTER — Ambulatory Visit (HOSPITAL_BASED_OUTPATIENT_CLINIC_OR_DEPARTMENT_OTHER): Payer: Medicare Other | Admitting: Cardiology

## 2021-01-31 ENCOUNTER — Encounter (HOSPITAL_BASED_OUTPATIENT_CLINIC_OR_DEPARTMENT_OTHER): Payer: Self-pay | Admitting: Cardiology

## 2021-01-31 ENCOUNTER — Other Ambulatory Visit: Payer: Self-pay

## 2021-01-31 VITALS — BP 132/82 | HR 80 | Ht 67.0 in | Wt 180.1 lb

## 2021-01-31 DIAGNOSIS — R072 Precordial pain: Secondary | ICD-10-CM

## 2021-01-31 DIAGNOSIS — Z7189 Other specified counseling: Secondary | ICD-10-CM

## 2021-01-31 DIAGNOSIS — I700 Atherosclerosis of aorta: Secondary | ICD-10-CM | POA: Diagnosis not present

## 2021-01-31 DIAGNOSIS — Z8249 Family history of ischemic heart disease and other diseases of the circulatory system: Secondary | ICD-10-CM | POA: Diagnosis not present

## 2021-01-31 DIAGNOSIS — Z01812 Encounter for preprocedural laboratory examination: Secondary | ICD-10-CM | POA: Diagnosis not present

## 2021-01-31 DIAGNOSIS — I428 Other cardiomyopathies: Secondary | ICD-10-CM

## 2021-01-31 MED ORDER — METOPROLOL TARTRATE 50 MG PO TABS: ORAL_TABLET | ORAL | 0 refills | Status: DC

## 2021-01-31 NOTE — Progress Notes (Signed)
Cardiology Office Note:    Date:  01/31/2021   ID:  April [name], DOB 05/09/1955, MRN [id number]  PCP:  [name], MD  Cardiologist:  Buford Dresser, MD (prior Dr. Rockey Situ 2019)  Referring MD: [name], MD   CC: follow up  History of Present Illness:    April [name] is a 66 y.o. female with a hx of COPD gold stage 3, pulmonary nodules, PMR, temporal arteritis who is seen for follow up today. I initially met her 01/2019 as a new consult at the request of Damita Dunnings Elveria Rising, MD for the evaluation and management of abnormal echo, need to establish care with cardiology.  Cardiovascular risk factors: Chronic inflammatory conditions: PMR/temporal arteritis Tobacco use history: former Family history: mother had bypass surgery at age 13 (MI prior), kidney disease, colon cancer. Deceased aged 70. Father died of an accident at age 53, had COPD prior. MGF with stroke, died age 10. No other CVD that she knows of, possibly one other aunt/uncle with a stroke. Prior cardiac testing and/or incidental findings on other testing (ie coronary calcium): aortic calcifications seen on noncontrast ct. Cardiac cath 2006 by Dr. Gwenlyn Found, no CAD. Stress test 09/08/2017 without ischemia. Echo 11/20 with reduced EF.  Today: Had Covid in the fall, had a tough case but didn't need to be admitted to the hospital.  Has had falls, no loss of consciousness, feels that her balance is office. Has had some injuries recently but no broken bones.  Had the lung cancer CT 12/11/20, noted aortic atherosclerosis and coronary artery calcium. Has chest pain, sharp, stabbing, low sternum, self limited (at most 30 minutes). No radiation. No clear triggers other than it is after exertion. No clear alleviating factors. No associated symptoms. I personally reviewed her images. I cannot see clear foci of coronary calcification.  Has had labile blood pressure at home, as high as 160/90s. No low numbers.    Denies PND, orthopnea, LE edema or unexpected weight gain. No syncope or palpitations.   Past Medical History:  Diagnosis Date   Allergy    Anemia    Anxiety    Anxiety and depression    related to caring for mother during terminal illness   Arthritis    Asthma    AVN (avascular necrosis of bone) (HCC)    hip and wrist   Bronchitis    hx of   Cataract    Chronic kidney disease    COPD (chronic obstructive pulmonary disease) (HCC)    Depression    Emphysema of lung ([place])    Endometriosis    FH: CAD (coronary artery disease)    GERD (gastroesophageal reflux disease)    ocassional   History of blood transfusion    after hip repackment - broke out in hives and started itching   History of palpitations    Hypertension    Hypothyroid    Non-ischemic cardiomyopathy ([place]) 2019   Osteopenia    forteo through Dr. Estanislado Pandy (started 10/12)   PMR (polymyalgia rheumatica) ([place])    SIRS (systemic inflammatory response syndrome) ([place]) 10/2017   Smoker    SOB (shortness of breath) on exertion    Squamous cell carcinoma    facial, 2016   Temporal arteritis (HCC)    s/p prednisone taper    Past Surgical History:  Procedure Laterality Date   ABDOMINAL ADHESION SURGERY     ABDOMINAL HYSTERECTOMY     BREAST EXCISIONAL BIOPSY Left    BREAST  REDUCTION SURGERY Bilateral 06/13/2019   Procedure: MAMMARY REDUCTION  (BREAST);  Surgeon: Cindra Presume, MD;  Location: [facility];  Service: Plastics;  Laterality: Bilateral;   BREAST SURGERY Left    benign bx 1990   CARDIAC CATHETERIZATION  2007   no PCI   CATARACT EXTRACTION Bilateral    CHOLECYSTECTOMY     COLONOSCOPY W/ POLYPECTOMY     INCONTINENCE SURGERY     2007    JOINT REPLACEMENT Left 2012   left hip   REDUCTION MAMMAPLASTY     2021   REVERSE SHOULDER ARTHROPLASTY Left 05/28/2020   Procedure: REVERSE SHOULDER ARTHROPLASTY;  Surgeon: Nicholes Stairs, MD;  Location: [facility];  Service: Orthopedics;  Laterality: Left;  2.5 hrs    TONSILLECTOMY     TOTAL HIP ARTHROPLASTY Right 10/04/2012   Procedure: TOTAL HIP ARTHROPLASTY;  Surgeon: Garald Balding, MD;  Location: [facility];  Service: Orthopedics;  Laterality: Right;   TOTAL SHOULDER REPLACEMENT Left    WRIST SURGERY Left    2012/ left wrist/ bone removed due to necrosis    Current Medications: Current Outpatient Medications on File Prior to Visit  Medication Sig   acetaminophen (TYLENOL) 500 MG tablet Take 1,000 mg by mouth every 6 (six) hours as needed for moderate pain or headache.   albuterol (PROVENTIL) (2.5 MG/3ML) 0.083% nebulizer solution Take 3 mLs (2.5 mg total) by nebulization every 6 (six) hours as needed for wheezing or shortness of breath.   albuterol (VENTOLIN HFA) 108 (90 Base) MCG/ACT inhaler Inhale 2 puffs into the lungs every 6 (six) hours as needed for wheezing or shortness of breath. Okay to dispense proair/Ventolin/albuterol.   ALPRAZolam (XANAX) 0.5 MG tablet Take 0.5-1 tablets (0.25-0.5 mg total) by mouth 2 (two) times daily as needed. for anxiety   azithromycin (ZITHROMAX) 250 MG tablet Take 1 tablet (250 mg total) by mouth every Monday, Wednesday, and Friday.   Budeson-Glycopyrrol-Formoterol (BREZTRI AEROSPHERE) 160-9-4.8 MCG/ACT AERO Inhale 2 puffs into the lungs every 12 (twelve) hours.   Budeson-Glycopyrrol-Formoterol (BREZTRI AEROSPHERE) 160-9-4.8 MCG/ACT AERO Inhale 2 puffs into the lungs in the morning and at bedtime.   buPROPion (WELLBUTRIN XL) 300 MG 24 hr tablet TAKE 1 TABLET BY MOUTH  DAILY   Calcium 500 MG CHEW Chew 500 mg by mouth 2 (two) times daily.    Cholecalciferol (DIALYVITE VITAMIN D 5000) 125 MCG (5000 UT) capsule 1 tab a day except for 2 on Sundays (Patient taking differently: Take 5,000-10,000 Units by mouth See admin instructions. 5000 units a day except for 10,000 units on Sundays)   fluticasone (FLONASE) 50 MCG/ACT nasal spray Place 2 sprays into both nostrils daily as needed for allergies or rhinitis.   levothyroxine  (SYNTHROID) 125 MCG tablet TAKE 1 TABLET BY MOUTH  DAILY EXCEPT TAKE 1 AND 1/2 TABLETS BY MOUTH ON SUNDAY   meclizine (ANTIVERT) 25 MG tablet Take 0.5-1 tablets (12.5-25 mg total) by mouth 3 (three) times daily as needed for dizziness.   metoprolol succinate (TOPROL-XL) 25 MG 24 hr tablet TAKE 1 TABLET BY MOUTH  DAILY   Polyethyl Glycol-Propyl Glycol (SYSTANE OP) Place 1 drop into both eyes every morning.   Spacer/Aero-Holding Chambers (AEROCHAMBER MV) inhaler Use as instructed   tiZANidine (ZANAFLEX) 4 MG tablet TAKE 1/2 TO 1 TABLET BY  MOUTH EVERY 6 HOURS AS  NEEDED FOR MUSCLE SPASMS   UNABLE TO FIND Take 2 each by mouth at bedtime. Med Name: Hemp gummies   valsartan (DIOVAN) 40 MG tablet TAKE  1 TABLET BY MOUTH  DAILY   vitamin C (ASCORBIC ACID) 250 MG tablet Take 500 mg by mouth daily.   No current facility-administered medications on file prior to visit.     Allergies:   Sulfonamide derivatives, Alendronate sodium, Dilaudid [hydromorphone hcl], and Wound dressing adhesive   Social History   Tobacco Use   Smoking status: Former    Packs/day: 0.33    Years: 34.00    Pack years: 11.22    Types: Cigarettes    Quit date: 10/24/2017    Years since quitting: 3.2   Smokeless tobacco: Never   Tobacco comments:    quit smoking october 2019  Vaping Use   Vaping Use: Former   Devices: tried - quited prior to 2019  Substance Use Topics   Alcohol use: Yes    Alcohol/week: 0.0 standard drinks    Comment: occasional- rarely   Drug use: Never    Family History: family history includes Alzheimer's disease in her mother; Arthritis in her brother; Breast cancer in her maternal aunt; Colon cancer in her mother; Colon polyps in her brother; Healthy in her son; Heart disease in her mother; Hypertension in her mother; Kidney failure in her mother; Suicidality in her brother. There is no history of Esophageal cancer, Stomach cancer, or Rectal cancer.  ROS:   Please see the history of present  illness.  Additional pertinent ROS otherwise unremarkable.  EKGs/Labs/Other Studies Reviewed:    The following studies were reviewed today: Echo 09/01/2017 - Left ventricle: The cavity size was normal. Systolic function was   mildly to moderately reduced. The estimated ejection fraction was   in the range of 40% to 45%. Diffuse hypokinesis. Doppler   parameters are consistent with abnormal left ventricular   relaxation (grade 1 diastolic dysfunction). - Left atrium: The atrium was normal in size. - Right ventricle: Systolic function was normal. - Pulmonary arteries: Systolic pressure was within the normal   range.  Cardiac cath 2006:  DESCRIPTION OF PROCEDURE:  The patient was brought to the second floor [facility] in a postabsorptive state.  She was  premedicated with p.o. Valium.  Her right groin was prepped and shaved in  the usual sterile fashion.  1% Xylocaine was used for local anesthesia.  A 6  French sheath was inserted into the right femoral artery using standard  Seldinger technique.  A 6 French right and left Judkins diagnostic catheters  as well as 6 French pigtail catheter, were used for selective coronary  angiography, left ventriculography, and RCA angiography.  Visipaque dye was  used for the entirety of the case.  Retrograde aortic and left ventricular  pull back pressures were recorded.    HEMODYNAMIC DATA:  1.  Aortic systolic pressure 332, diastolic pressure 91.  2.  Left ventricular systolic pressure 951, end diastolic pressure 11.    SELECTIVE CORONARY ANGIOGRAPHY:  1.  Left main normal.  2.  LAD normal.  3.  The left circumflex was dominant normal.  4.  The right coronary artery was nondominant and was normal.    LEFT VENTRICULOGRAPHY:  RAO left ventriculogram was performed using 20 mL of  Visipaque dye at 10 mL per second.  The overall LVEF is greater than 50%  without wall motion abnormalities.    RCA ANGIOGRAPHY:   Performed in the LAO view using 20 mL of Visipaque dye at  20 mL per second, arch vessels were intact, there was no evidence of aortic  dissection.    IMPRESSION:  Ms. Varney has essentially normal coronary arteries, normal LV  function, no evidence of dissection.  I believe her chest pain is  noncardiac, most likely reflux related.  The sheath was removed and pressure  was held to achieve hemostasis.  The patient left the lab in stable  condition.  She will be observed overnight and discharged home in the  morning.  She will follow up with her primary care MD.  EKG:  EKG is personally reviewed.   01/31/21: NSR at 80 bpm, nonspecific t wave pattern  Recent Labs: 09/26/2020: ALT 31; BUN 12; Creatinine, Ser 1.15; Hemoglobin 13.8; Platelets 214; Potassium 3.7; Sodium 138 11/21/2020: TSH 1.53  Recent Lipid Panel    Component Value Date/Time   CHOL 201 (H) 11/21/2020 0939   TRIG 65.0 11/21/2020 0939   HDL 60.10 11/21/2020 0939   CHOLHDL 3 11/21/2020 0939   VLDL 13.0 11/21/2020 0939   LDLCALC 128 (H) 11/21/2020 0939    Physical Exam:    VS:  BP 132/82    Pulse 80    Ht 5' 7" (1.702 m)    Wt 180 lb 1.6 oz (81.7 kg)    BMI 28.21 kg/m     Wt Readings from Last 3 Encounters:  01/31/21 180 lb 1.6 oz (81.7 kg)  01/14/21 181 lb (82.1 kg)  12/20/20 180 lb 9.6 oz (81.9 kg)    GEN: Well nourished, well developed in no acute distress HEENT: Normal, moist mucous membranes NECK: No JVD CARDIAC: regular rhythm, normal S1 and S2, no rubs or gallops. No murmur. VASCULAR: Radial and DP pulses 2+ bilaterally. No carotid bruits RESPIRATORY:  Clear to auscultation without rales, wheezing or rhonchi  ABDOMEN: Soft, non-tender, non-distended MUSCULOSKELETAL:  Ambulates independently SKIN: Warm and dry, no edema NEUROLOGIC:  Alert and oriented x 3. No focal neuro deficits noted. PSYCHIATRIC:  Normal affect    ASSESSMENT:    1. Precordial pain   2. Pre-procedure lab exam   3. Nonischemic  cardiomyopathy ([place])   4. Aortic atherosclerosis ([place])   5. Family history of heart disease   6. Cardiac risk counseling     PLAN:    Atypical chest pain Report of coronary calcifications -I personally reviewed her CT lung cancer screening, and she does not have significant coronary calcium on my review. However, it is not a gated scan -with her risk factors and chest pain, we discussed further evaluation -after shared decision making, will pursue coronary CT. Will keep metoprolol succinate, hold AM valsartan to have room for additional metoprolol tartrate and nitroglycerin for the test -bmet ordered -counseled on red flag warning signs that need immediate medical attention -we did discuss role for statins if plaque noted. She is not in favor of more medications in general but would be open to discussion if plaque noted on CT  Abnormal echo, with reduced EF consistent with nonischemic cardiomyopathy: NYHA class I-II.  -stress test negative for ischemia, remote cath normal -see above, with chest pain will pursue CT -continue metoprolol succinate 25 mg daily, valsartan 40 mg daily. Limited by low blood pressure -clinically appears euvolemic -recheck echo PRN for change in symptoms  Aortic atherosclerosis: incidental on CT scans -continue to discuss aspirin, statin. We spent significant time on this today, see above  Family history of heart disease: no premature disease, but multiple members of the family with ASCVD  Cardiac risk counseling and prevention recommendations: -recommend heart healthy/Mediterranean diet, with whole grains, fruits, vegetable, fish,  lean meats, nuts, and olive oil. Limit salt. -recommend moderate walking, 3-5 times/week for 30-50 minutes each session. Aim for at least 150 minutes.week. Goal should be pace of 3 miles/hours, or walking 1.5 miles in 30 minutes -recommend avoidance of tobacco products. Avoid excess alcohol. -ASCVD risk score: The 10-year ASCVD  risk score (Arnett DK, et al., 2019) is: 16.2%   Values used to calculate the score:     Age: 54 years     Sex: Female     Is Non-Hispanic African American: No     Diabetic: No     Tobacco smoker: Yes     Systolic Blood Pressure: 017 mmHg     Is BP treated: Yes     HDL Cholesterol: 60.1 mg/dL     Total Cholesterol: 201 mg/dL    Plan for follow up: 4-6 weeks to review test and discuss next steps  Total time of encounter: 40 minutes total time of encounter, including 33 minutes spent in face-to-face patient care. This time includes coordination of care and counseling regarding chest pain, atherosclerosis, options for evaluation. Remainder of non-face-to-face time involved reviewing chart documents/testing relevant to the patient encounter and documentation in the medical record.  Buford Dresser, MD, PhD, [facility]   Medication Adjustments/Labs and Tests Ordered: Current medicines are reviewed at length with the patient today.  Concerns regarding medicines are outlined above.  Orders Placed This Encounter  Procedures   CT CORONARY MORPH W/CTA COR W/SCORE W/CA W/CM &/OR WO/CM   Basic metabolic panel   EKG [id number]   Meds ordered this encounter  Medications   metoprolol tartrate (LOPRESSOR) 50 MG tablet    Sig: TAKE 1 TABLET 2 HR PRIOR TO CARDIAC PROCEDURE    Dispense:  1 tablet    Refill:  0    Patient Instructions  Medication Instructions:  Your Physician recommend you continue on your current medication as directed.    *If you need a refill on your cardiac medications before your next appointment, please call your pharmacy*   Lab Work: Your provider has recommended lab work today (BMP). Please have this collected at [facility] [place]. The lab is open 8:00 am - 4:30 pm. Please avoid 12:00p - 1:00p for lunch hour. You do not need an appointment. Please go to [address] [place], [place] [address]. This is in the  Primary Care office on the 3rd floor, let them know you are there for blood work and they will direct you to the lab.  If you have labs (blood work) drawn today and your tests are completely normal, you will receive your results only by: [place] (if you have MyChart) OR A paper copy in the mail If you have any lab test that is abnormal or we need to change your treatment, we will call you to review the results.   Testing/Procedures: Cardiac CT Angiography (CTA), is a special type of CT scan that uses a computer to produce multi-dimensional views of major blood vessels throughout the body. In CT angiography, a contrast material is injected through an IV to help visualize the blood vessels  [facility]  Follow-Up: At [facility], you and your health needs are our priority.  As part of our continuing mission to provide you with exceptional heart care, we have created designated Provider Care Teams.  These Care Teams include your primary Cardiologist (physician) and Advanced Practice Providers (APPs -  Physician Assistants and  Nurse Practitioners) who all work together to provide you with the care you need, when you need it.  We recommend signing up for the patient portal called "MyChart".  Sign up information is provided on this After Visit Summary.  MyChart is used to connect with patients for Virtual Visits (Telemedicine).  Patients are able to view lab/test results, encounter notes, upcoming appointments, etc.  Non-urgent messages can be sent to your provider as well.   To learn more about what you can do with MyChart, go to [email or web address].    Your next appointment:   4-6 week(s)  The format for your next appointment:   In Person  Provider:   Buford Dresser, MD      Your cardiac CT will be scheduled at one of the below locations:   [facility] [address] [place], [place] [address] [phone]  If scheduled at [facility], please arrive at the [facility] main entrance (entrance A) of [facility] 30 minutes prior to test start time. You can use the FREE valet parking offered at the main entrance (encouraged to control the heart rate for the test) Proceed to the [facility] Rad[facility] Department (first floor) to check-in and test prep.  If scheduled at [facility], please arrive 15 mins early for check-in and test prep.  Please follow these instructions carefully (unless otherwise directed):  On the Night Before the Test: Be sure to Drink plenty of water. Do not consume any caffeinated/decaffeinated beverages or chocolate 12 hours prior to your test. Do not take any antihistamines 12 hours prior to your test.  On the Day of the Test: Drink plenty of water until 1 hour prior to the test. Do not eat any food 4 hours prior to the test. You may take your regular medications prior to the test. Take your metoprolol succinate as usual the night before. HOLD Losartan morning of the test. Take metoprolol (Lopressor) 50 mg  two hours prior to test  (one time short acting). It's ok to take a xanax before if you need it FEMALES- please wear underwire-free bra if available, avoid dresses & tight clothing        After the Test: Drink plenty of water. After receiving IV contrast, you may experience a mild flushed feeling. This is normal. On occasion, you may experience a mild rash up to 24 hours after the test. This is not dangerous. If this occurs, you can take Benadryl 25 mg and increase your fluid intake. If you experience trouble breathing, this can be serious. If it is severe call 911 IMMEDIATELY. If it is mild, please call our office. If you take any of these medications: Glipizide/Metformin, Avandament, Glucavance, please do not take 48 hours after completing test unless otherwise instructed.  We will call to schedule your test 2-4 weeks out understanding that some  insurance companies will need an authorization prior to the service being performed.   For non-scheduling related questions, please contact the cardiac imaging nurse navigator should you have any questions/concerns: Marchia Bond, Cardiac Imaging Nurse Navigator Gordy Clement, Cardiac Imaging Nurse Navigator [facility] Direct Office Dial: [phone]   For scheduling needs, including cancellations and rescheduling, please call [place], [phone].     Signed, Buford Dresser, MD PhD, [facility] 01/31/2021     [place]

## 2021-01-31 NOTE — Patient Instructions (Signed)
Medication Instructions:  Your Physician recommend you continue on your current medication as directed.    *If you need a refill on your cardiac medications before your next appointment, please call your pharmacy*   Lab Work: Your provider has recommended lab work today (BMP). Please have this collected at [facility] [place]. The lab is open 8:00 am - 4:30 pm. Please avoid 12:00p - 1:00p for lunch hour. You do not need an appointment. Please go to [address] [place], [place] [address]. This is in the Primary Care office on the 3rd floor, let them know you are there for blood work and they will direct you to the lab.  If you have labs (blood work) drawn today and your tests are completely normal, you will receive your results only by: [place] (if you have MyChart) OR A paper copy in the mail If you have any lab test that is abnormal or we need to change your treatment, we will call you to review the results.   Testing/Procedures: Cardiac CT Angiography (CTA), is a special type of CT scan that uses a computer to produce multi-dimensional views of major blood vessels throughout the body. In CT angiography, a contrast material is injected through an IV to help visualize the blood vessels  [facility]  Follow-Up: At [facility], you and your health needs are our priority.  As part of our continuing mission to provide you with exceptional heart care, we have created designated Provider Care Teams.  These Care Teams include your primary Cardiologist (physician) and Advanced Practice Providers (APPs -  Physician Assistants and Nurse Practitioners) who all work together to provide you with the care you need, when you need it.  We recommend signing up for the patient portal called "MyChart".  Sign up information is provided on this After Visit Summary.  MyChart is used to connect with patients for Virtual Visits (Telemedicine).  Patients are able to  view lab/test results, encounter notes, upcoming appointments, etc.  Non-urgent messages can be sent to your provider as well.   To learn more about what you can do with MyChart, go to [email or web address].    Your next appointment:   4-6 week(s)  The format for your next appointment:   In Person  Provider:   Buford Dresser, MD      Your cardiac CT will be scheduled at one of the below locations:   [facility] [address] [place], [place] [address] [phone]  If scheduled at [facility], please arrive at the [facility] main entrance (entrance A) of [facility] 30 minutes prior to test start time. You can use the FREE valet parking offered at the main entrance (encouraged to control the heart rate for the test) Proceed to the [facility] Radiology Department (first floor) to check-in and test prep.  If scheduled at [facility], please arrive 15 mins early for check-in and test prep.  Please follow these instructions carefully (unless otherwise directed):  On the Night Before the Test: Be sure to Drink plenty of water. Do not consume any caffeinated/decaffeinated beverages or chocolate 12 hours prior to your test. Do not take any antihistamines 12 hours prior to your test.  On the Day of the Test: Drink plenty of water until 1 hour prior to the test. Do not eat any food 4 hours prior to the test. You may take your regular medications prior to the test. Take  your metoprolol succinate as usual the night before. HOLD Losartan morning of the test. Take metoprolol (Lopressor) 50 mg  two hours prior to test  (one time short acting). It's ok to take a xanax before if you need it FEMALES- please wear underwire-free bra if available, avoid dresses & tight clothing        After the Test: Drink plenty of water. After receiving IV contrast, you may experience a mild flushed feeling. This is normal. On  occasion, you may experience a mild rash up to 24 hours after the test. This is not dangerous. If this occurs, you can take Benadryl 25 mg and increase your fluid intake. If you experience trouble breathing, this can be serious. If it is severe call 911 IMMEDIATELY. If it is mild, please call our office. If you take any of these medications: Glipizide/Metformin, Avandament, Glucavance, please do not take 48 hours after completing test unless otherwise instructed.  We will call to schedule your test 2-4 weeks out understanding that some insurance companies will need an authorization prior to the service being performed.   For non-scheduling related questions, please contact the cardiac imaging nurse navigator should you have any questions/concerns: Marchia Bond, Cardiac Imaging Nurse Navigator Gordy Clement, Cardiac Imaging Nurse Navigator [facility] Direct Office Dial: [phone]   For scheduling needs, including cancellations and rescheduling, please call [place], [phone].

## 2021-02-01 LAB — BASIC METABOLIC PANEL
BUN/Creatinine Ratio: 14 (ref 12–28)
BUN: 15 mg/dL (ref 8–27)
CO2: 22 mmol/L (ref 20–29)
Calcium: 10.2 mg/dL (ref 8.7–10.3)
Chloride: 101 mmol/L (ref 96–106)
Creatinine, Ser: 1.09 mg/dL — ABNORMAL HIGH (ref 0.57–1.00)
Glucose: 99 mg/dL (ref 70–99)
Potassium: 4.4 mmol/L (ref 3.5–5.2)
Sodium: 138 mmol/L (ref 134–144)
eGFR: 56 mL/min/{1.73_m2} — ABNORMAL LOW (ref >59)

## 2021-02-07 ENCOUNTER — Ambulatory Visit: Payer: Medicare Other | Admitting: Cardiovascular Disease

## 2021-02-11 ENCOUNTER — Telehealth (HOSPITAL_COMMUNITY): Payer: Self-pay | Admitting: *Deleted

## 2021-02-11 NOTE — Telephone Encounter (Signed)
Reaching out to patient to offer assistance regarding upcoming cardiac imaging study; pt verbalizes understanding of appt date/time, parking situation and where to check in, pre-test NPO status and medications ordered, and verified current allergies; name and call back number provided for further questions should they arise  Gordy Clement RN Navigator Cardiac Imaging [facility] [phone] office [phone] cell  Patient to take 50mg  metoprolol tartrate two hours prior to her cardiac CT scan. She is aware that she needs a driver if she takes her Xanax for the test and to arrive at 2pm for her 2:30pm scan.

## 2021-02-12 ENCOUNTER — Other Ambulatory Visit: Payer: Self-pay

## 2021-02-12 ENCOUNTER — Encounter (HOSPITAL_COMMUNITY): Payer: Self-pay

## 2021-02-12 ENCOUNTER — Ambulatory Visit (HOSPITAL_COMMUNITY)
Admission: RE | Admit: 2021-02-12 | Discharge: 2021-02-12 | Disposition: A | Discharge status: Home / Self Care | Payer: Medicare Other | Source: Ambulatory Visit | Attending: Cardiology | Admitting: Cardiology

## 2021-02-12 DIAGNOSIS — R072 Precordial pain: Secondary | ICD-10-CM | POA: Insufficient documentation

## 2021-02-12 MED ORDER — IOHEXOL 350 MG/ML SOLN
95.0000 mL | Freq: Once | INTRAVENOUS | Status: AC | PRN
  Administered 2021-02-12: 95 mL via INTRAVENOUS

## 2021-02-12 MED ORDER — NITROGLYCERIN 0.4 MG SL SUBL: 0.8000 mg | SUBLINGUAL_TABLET | Freq: Once | SUBLINGUAL | Status: AC

## 2021-02-12 MED ORDER — DILTIAZEM HCL 25 MG/5ML IV SOLN
10.0000 mg | Freq: Once | INTRAVENOUS | Status: AC
  Administered 2021-02-12: 10 mg via INTRAVENOUS

## 2021-02-12 MED ORDER — METOPROLOL TARTRATE 5 MG/5ML IV SOLN: 10.0000 mg | Freq: Once | INTRAVENOUS | Status: AC

## 2021-02-12 MED ORDER — METOPROLOL TARTRATE 5 MG/5ML IV SOLN
INTRAVENOUS | Status: AC
  Administered 2021-02-12: 10 mg via INTRAVENOUS
  Filled 2021-02-12: qty 10

## 2021-02-12 MED ORDER — DILTIAZEM HCL 25 MG/5ML IV SOLN
INTRAVENOUS | Status: AC
  Filled 2021-02-12: qty 5

## 2021-02-12 MED ORDER — NITROGLYCERIN 0.4 MG SL SUBL
SUBLINGUAL_TABLET | SUBLINGUAL | Status: AC
  Administered 2021-02-12: 0.8 mg via SUBLINGUAL
  Filled 2021-02-12: qty 2

## 2021-02-25 ENCOUNTER — Ambulatory Visit
Admission: RE | Admit: 2021-02-25 | Discharge: 2021-02-25 | Disposition: A | Discharge status: Home / Self Care | Payer: Medicare Other | Source: Ambulatory Visit | Attending: Family Medicine | Admitting: Family Medicine

## 2021-02-25 ENCOUNTER — Other Ambulatory Visit: Payer: Self-pay

## 2021-02-25 DIAGNOSIS — Z780 Asymptomatic menopausal state: Secondary | ICD-10-CM | POA: Diagnosis not present

## 2021-02-25 DIAGNOSIS — M810 Age-related osteoporosis without current pathological fracture: Secondary | ICD-10-CM | POA: Diagnosis not present

## 2021-02-25 DIAGNOSIS — M85832 Other specified disorders of bone density and structure, left forearm: Secondary | ICD-10-CM | POA: Diagnosis not present

## 2021-02-28 ENCOUNTER — Ambulatory Visit (HOSPITAL_BASED_OUTPATIENT_CLINIC_OR_DEPARTMENT_OTHER): Payer: Medicare Other | Admitting: Cardiology

## 2021-02-28 ENCOUNTER — Other Ambulatory Visit: Payer: Self-pay

## 2021-02-28 ENCOUNTER — Encounter (HOSPITAL_BASED_OUTPATIENT_CLINIC_OR_DEPARTMENT_OTHER): Payer: Self-pay | Admitting: Cardiology

## 2021-02-28 VITALS — BP 130/72 | HR 97 | Ht 66.5 in | Wt 176.2 lb

## 2021-02-28 DIAGNOSIS — E7800 Pure hypercholesterolemia, unspecified: Secondary | ICD-10-CM

## 2021-02-28 DIAGNOSIS — I2510 Atherosclerotic heart disease of native coronary artery without angina pectoris: Secondary | ICD-10-CM | POA: Diagnosis not present

## 2021-02-28 DIAGNOSIS — Z8249 Family history of ischemic heart disease and other diseases of the circulatory system: Secondary | ICD-10-CM | POA: Diagnosis not present

## 2021-02-28 DIAGNOSIS — Z712 Person consulting for explanation of examination or test findings: Secondary | ICD-10-CM

## 2021-02-28 DIAGNOSIS — I700 Atherosclerosis of aorta: Secondary | ICD-10-CM

## 2021-02-28 DIAGNOSIS — Z5181 Encounter for therapeutic drug level monitoring: Secondary | ICD-10-CM | POA: Diagnosis not present

## 2021-02-28 MED ORDER — ROSUVASTATIN CALCIUM 5 MG PO TABS: 5.0000 mg | ORAL_TABLET | Freq: Every day | ORAL | 3 refills | Status: DC

## 2021-02-28 MED ORDER — ASPIRIN EC 81 MG PO TBEC: 81.0000 mg | DELAYED_RELEASE_TABLET | Freq: Every day | ORAL | 3 refills | Status: DC

## 2021-02-28 NOTE — Progress Notes (Incomplete)
Cardiology Office Note:    Date:  02/28/2021   ID:  April [name], DOB 25-Jun-1955, MRN [id number]  PCP:  [name], MD  Cardiologist:  Buford Dresser, MD (prior Dr. Rockey Situ 2019)  Referring MD: [name], MD   CC: follow up  History of Present Illness:    MAKYNLI STILLS is a 66 y.o. female with a hx of COPD gold stage 3, pulmonary nodules, PMR, temporal arteritis who is seen for follow up today. I initially met her 01/2019 as a new consult at the request of [name], MD for the evaluation and management of abnormal echo.  Cardiovascular risk factors: Chronic inflammatory conditions: PMR/temporal arteritis Tobacco use history: former Family history: mother had bypass surgery at age 53 (MI prior), kidney disease, colon cancer. Deceased aged 79. Father died of an accident at age 28, had COPD prior. MGF with stroke, died age 6. No other CVD that she knows of, possibly one other aunt/uncle with a stroke. Prior cardiac testing and/or incidental findings on other testing (ie coronary calcium): aortic calcifications seen on noncontrast ct. Cardiac cath 2006 by Dr. Gwenlyn Found, no CAD. Stress test 09/08/2017 without ischemia. Echo 11/20 with reduced EF.  Today: Overall, she states she was feeling well until she walked into the building and became significantly short of breath. She attributes this to her COPD. Typically she recovers in a few minutes.  As of 11/2020 labs her LDL was 128, HDL was 60. We discussed at length the risks and benefits of starting a statin. Also offered dietary recommendations for a heart healthy diet.  She denies any palpitations, chest pain, or peripheral edema. No lightheadedness, headaches, syncope, orthopnea, or PND.   Past Medical History:  Diagnosis Date   Allergy    Anemia    Anxiety    Anxiety and depression    related to caring for mother during terminal illness   Arthritis    Asthma    AVN (avascular necrosis of bone) ([place])     hip and wrist   Bronchitis    hx of   Cataract    Chronic kidney disease    COPD (chronic obstructive pulmonary disease) (HCC)    Depression    Emphysema of lung ([place])    Endometriosis    FH: CAD (coronary artery disease)    GERD (gastroesophageal reflux disease)    ocassional   History of blood transfusion    after hip repackment - broke out in hives and started itching   History of palpitations    Hypertension    Hypothyroid    Non-ischemic cardiomyopathy ([place]) 2019   Osteopenia    forteo through Dr. Estanislado Pandy (started 10/12)   PMR (polymyalgia rheumatica) ([place])    SIRS (systemic inflammatory response syndrome) ([place]) 10/2017   Smoker    SOB (shortness of breath) on exertion    Squamous cell carcinoma    facial, 2016   Temporal arteritis (HCC)    s/p prednisone taper    Past Surgical History:  Procedure Laterality Date   ABDOMINAL ADHESION SURGERY     ABDOMINAL HYSTERECTOMY     BREAST EXCISIONAL BIOPSY Left    BREAST REDUCTION SURGERY Bilateral 06/13/2019   Procedure: MAMMARY REDUCTION  (BREAST);  Surgeon: Cindra Presume, MD;  Location: [facility];  Service: Plastics;  Laterality: Bilateral;   BREAST SURGERY Left    benign bx 1990   CARDIAC CATHETERIZATION  2007   no PCI   CATARACT EXTRACTION Bilateral  CHOLECYSTECTOMY     COLONOSCOPY W/ POLYPECTOMY     INCONTINENCE SURGERY     2007    JOINT REPLACEMENT Left 2012   left hip   REDUCTION MAMMAPLASTY     2021   REVERSE SHOULDER ARTHROPLASTY Left 05/28/2020   Procedure: REVERSE SHOULDER ARTHROPLASTY;  Surgeon: Nicholes Stairs, MD;  Location: [facility];  Service: Orthopedics;  Laterality: Left;  2.5 hrs   TONSILLECTOMY     TOTAL HIP ARTHROPLASTY Right 10/04/2012   Procedure: TOTAL HIP ARTHROPLASTY;  Surgeon: Garald Balding, MD;  Location: [facility];  Service: Orthopedics;  Laterality: Right;   TOTAL SHOULDER REPLACEMENT Left    WRIST SURGERY Left    2012/ left wrist/ bone removed due to necrosis    Current  Medications: Current Outpatient Medications on File Prior to Visit  Medication Sig   acetaminophen (TYLENOL) 500 MG tablet Take 1,000 mg by mouth every 6 (six) hours as needed for moderate pain or headache.   albuterol (VENTOLIN HFA) 108 (90 Base) MCG/ACT inhaler Inhale 2 puffs into the lungs every 6 (six) hours as needed for wheezing or shortness of breath. Okay to dispense proair/Ventolin/albuterol.   ALPRAZolam (XANAX) 0.5 MG tablet Take 0.5-1 tablets (0.25-0.5 mg total) by mouth 2 (two) times daily as needed. for anxiety   azithromycin (ZITHROMAX) 250 MG tablet Take 1 tablet (250 mg total) by mouth every Monday, Wednesday, and Friday.   Budeson-Glycopyrrol-Formoterol (BREZTRI AEROSPHERE) 160-9-4.8 MCG/ACT AERO Inhale 2 puffs into the lungs every 12 (twelve) hours.   buPROPion (WELLBUTRIN XL) 300 MG 24 hr tablet TAKE 1 TABLET BY MOUTH  DAILY   Calcium 500 MG CHEW Chew 500 mg by mouth 2 (two) times daily.    Cholecalciferol (DIALYVITE VITAMIN D 5000) 125 MCG (5000 UT) capsule 1 tab a day except for 2 on Sundays   fluticasone (FLONASE) 50 MCG/ACT nasal spray Place 2 sprays into both nostrils daily as needed for allergies or rhinitis.   levothyroxine (SYNTHROID) 125 MCG tablet TAKE 1 TABLET BY MOUTH  DAILY EXCEPT TAKE 1 AND 1/2 TABLETS BY MOUTH ON SUNDAY   meclizine (ANTIVERT) 25 MG tablet Take 0.5-1 tablets (12.5-25 mg total) by mouth 3 (three) times daily as needed for dizziness.   metoprolol succinate (TOPROL-XL) 25 MG 24 hr tablet TAKE 1 TABLET BY MOUTH  DAILY   Polyethyl Glycol-Propyl Glycol (SYSTANE OP) Place 1 drop into both eyes every morning.   Spacer/Aero-Holding Chambers (AEROCHAMBER MV) inhaler Use as instructed   tiZANidine (ZANAFLEX) 4 MG tablet TAKE 1/2 TO 1 TABLET BY  MOUTH EVERY 6 HOURS AS  NEEDED FOR MUSCLE SPASMS   valsartan (DIOVAN) 40 MG tablet TAKE 1 TABLET BY MOUTH  DAILY   vitamin C (ASCORBIC ACID) 250 MG tablet Take 500 mg by mouth daily.   albuterol (PROVENTIL) (2.5  MG/3ML) 0.083% nebulizer solution Take 3 mLs (2.5 mg total) by nebulization every 6 (six) hours as needed for wheezing or shortness of breath. (Patient not taking: Reported on 02/28/2021)   metoprolol tartrate (LOPRESSOR) 50 MG tablet TAKE 1 TABLET 2 HR PRIOR TO CARDIAC PROCEDURE (Patient not taking: Reported on 02/28/2021)   UNABLE TO FIND Take 2 each by mouth at bedtime. Med Name: Hemp gummies (Patient not taking: Reported on 02/28/2021)   No current facility-administered medications on file prior to visit.     Allergies:   Sulfonamide derivatives, Alendronate sodium, Dilaudid [hydromorphone hcl], and Wound dressing adhesive   Social History   Tobacco Use   Smoking status:  Former    Packs/day: 0.33    Years: 34.00    Pack years: 11.22    Types: Cigarettes    Quit date: 10/24/2017    Years since quitting: 3.3   Smokeless tobacco: Never   Tobacco comments:    quit smoking october 2019  Vaping Use   Vaping Use: Former   Devices: tried - quited prior to 2019  Substance Use Topics   Alcohol use: Yes    Alcohol/week: 0.0 standard drinks    Comment: occasional- rarely   Drug use: Never    Family History: family history includes Alzheimer's disease in her mother; Arthritis in her brother; Breast cancer in her maternal aunt; Colon cancer in her mother; Colon polyps in her brother; Healthy in her son; Heart disease in her mother; Hypertension in her mother; Kidney failure in her mother; Suicidality in her brother. There is no history of Esophageal cancer, Stomach cancer, or Rectal cancer.  ROS:   Please see the history of present illness.   (+) Shortness of breath Additional pertinent ROS otherwise unremarkable.  EKGs/Labs/Other Studies Reviewed:    The following studies were reviewed today:  Cardiac/Coronary CTA 02/12/2021: Coronary Arteries:  Normal coronary origin.  RLeft dominance.   RCA is small, nondominant. Calcified plaque in ostial RCA causes 0-24% stenosis   Left main  is a large artery that gives rise to LAD and LCX arteries. Calcified plaque in the left main causes 0-24% stenosis   LAD is a large vessel that has no plaque.   LCX is a dominant artery.  There is no plaque.   There is a ramus that has no plaque   Other findings:   Left Ventricle: Mild dilatation   Left Atrium: Normal size. PFO   Pulmonary Veins: Normal configuration   Right Ventricle: Mild dilatation   Right Atrium: Normal size   Cardiac valves: No calcifications   Thoracic aorta: Normal size   Pulmonary Arteries: Normal size   Systemic Veins: Normal drainage   Pericardium: Normal thickness   IMPRESSION: 1. Coronary calcium score of 71. This was 74th percentile for age and sex matched control.   2.  Normal coronary origin with left dominance.   3. Nonobstructive CAD with calcified plaque in left main and ostial RCA causing minimal (0-24%) stenosis   CAD-RADS 1. Minimal non-obstructive CAD (0-24%). Consider non-atherosclerotic causes of chest pain. Consider preventive therapy and risk factor modification.  CT Chest 12/11/2020: COMPARISON:  CT chest 11/13/2019 CT AP and CT chest 07/04/2018   FINDINGS: Cardiovascular: Heart size is normal. Aortic atherosclerosis identified. Coronary artery atherosclerotic calcification.   Mediastinum/Nodes: No enlarged mediastinal, hilar, or axillary lymph nodes. Thyroid gland, trachea, and esophagus demonstrate no significant findings.   Lungs/Pleura: No pleural effusion, interstitial edema, or airspace consolidation. Scattered areas of scarring identified bilaterally. Moderate changes of centrilobular and paraseptal emphysema. There are 2 solid nodules within the right upper lobe which appear stable compared with CT from 07/04/2018. The largest nodule has a mean derived diameter of 6.4 mm.   Upper Abdomen: No acute abnormality.  Status post cholecystectomy.   Musculoskeletal: Spondylosis identified within the thoracic  spine. No acute or suspicious osseous findings.   IMPRESSION: 1. Lung-RADS 2, benign appearance or behavior. Continue annual screening with low-dose chest CT without contrast in 12 months. 2. Coronary artery calcifications. 3. Aortic Atherosclerosis (ICD10-I70.0).  Echo 11/15/2019: Sonographer Comments: Technically difficult study due to poor echo  windows. Breast reduction.  IMPRESSIONS    1. Left ventricular ejection  fraction, by estimation, is 55 to 60%. The  left ventricle has normal function. The left ventricle has no regional  wall motion abnormalities. The left ventricular internal cavity size was  mildly dilated. Left ventricular  diastolic parameters were normal.   2. Right ventricular systolic function is normal. The right ventricular  size is normal.   3. The mitral valve is normal in structure. No evidence of mitral valve  regurgitation. No evidence of mitral stenosis.   4. The aortic valve is normal in structure. Aortic valve regurgitation is  not visualized. No aortic stenosis is present.   5. The inferior vena cava is normal in size with greater than 50%  respiratory variability, suggesting right atrial pressure of 3 mmHg.   Nuclear Stress Test 09/07/2017: Pharmacological myocardial perfusion imaging study with no significant  ischemia Normal wall motion, EF estimated at 58% No EKG changes concerning for ischemia at peak stress or in recovery. Low risk scan  Echo 09/01/2017 - Left ventricle: The cavity size was normal. Systolic function was   mildly to moderately reduced. The estimated ejection fraction was   in the range of 40% to 45%. Diffuse hypokinesis. Doppler   parameters are consistent with abnormal left ventricular   relaxation (grade 1 diastolic dysfunction). - Left atrium: The atrium was normal in size. - Right ventricle: Systolic function was normal. - Pulmonary arteries: Systolic pressure was within the normal   range.  Cardiac cath 2006:   DESCRIPTION OF PROCEDURE:  The patient was brought to the second floor [facility] in a postabsorptive state.  She was  premedicated with p.o. Valium.  Her right groin was prepped and shaved in  the usual sterile fashion.  1% Xylocaine was used for local anesthesia.  A 6  French sheath was inserted into the right femoral artery using standard  Seldinger technique.  A 6 French right and left Judkins diagnostic catheters  as well as 6 French pigtail catheter, were used for selective coronary  angiography, left ventriculography, and RCA angiography.  Visipaque dye was  used for the entirety of the case.  Retrograde aortic and left ventricular  pull back pressures were recorded.    HEMODYNAMIC DATA:  1.  Aortic systolic pressure 683, diastolic pressure 91.  2.  Left ventricular systolic pressure 419, end diastolic pressure 11.    SELECTIVE CORONARY ANGIOGRAPHY:  1.  Left main normal.  2.  LAD normal.  3.  The left circumflex was dominant normal.  4.  The right coronary artery was nondominant and was normal.    LEFT VENTRICULOGRAPHY:  RAO left ventriculogram was performed using 20 mL of  Visipaque dye at 10 mL per second.  The overall LVEF is greater than 50%  without wall motion abnormalities.    RCA ANGIOGRAPHY:  Performed in the LAO view using 20 mL of Visipaque dye at  20 mL per second, arch vessels were intact, there was no evidence of aortic  dissection.    IMPRESSION:  Ms. Kendra has essentially normal coronary arteries, normal LV  function, no evidence of dissection.  I believe her chest pain is  noncardiac, most likely reflux related.  The sheath was removed and pressure  was held to achieve hemostasis.  The patient left the lab in stable  condition.  She will be observed overnight and discharged home in the  morning.  She will follow up with her primary care MD.  EKG:  EKG is personally reviewed.   02/28/2021:  EKG was not ordered. 01/31/21: NSR at  80 bpm, nonspecific t wave pattern  Recent Labs: 09/26/2020: ALT 31; Hemoglobin 13.8; Platelets 214 11/21/2020: TSH 1.53 01/31/2021: BUN 15; Creatinine, Ser 1.09; Potassium 4.4; Sodium 138   Recent Lipid Panel    Component Value Date/Time   CHOL 201 (H) 11/21/2020 0939   TRIG 65.0 11/21/2020 0939   HDL 60.10 11/21/2020 0939   CHOLHDL 3 11/21/2020 0939   VLDL 13.0 11/21/2020 0939   LDLCALC 128 (H) 11/21/2020 0939    Physical Exam:    VS:  BP 130/72    Pulse 97    Ht 5' 6.5" (1.689 m)    Wt 176 lb 3.2 oz (79.9 kg)    SpO2 96%    BMI 28.01 kg/m     Wt Readings from Last 3 Encounters:  02/28/21 176 lb 3.2 oz (79.9 kg)  01/31/21 180 lb 1.6 oz (81.7 kg)  01/14/21 181 lb (82.1 kg)    GEN: Well nourished, well developed in no acute distress HEENT: Normal, moist mucous membranes NECK: No JVD CARDIAC: regular rhythm, normal S1 and S2, no rubs or gallops. No murmur. VASCULAR: Radial and DP pulses 2+ bilaterally. No carotid bruits RESPIRATORY:  Clear to auscultation without rales, wheezing or rhonchi  ABDOMEN: Soft, non-tender, non-distended MUSCULOSKELETAL:  Ambulates independently SKIN: Warm and dry, no edema NEUROLOGIC:  Alert and oriented x 3. No focal neuro deficits noted. PSYCHIATRIC:  Normal affect    ASSESSMENT:    No diagnosis found.   PLAN:    Atypical chest pain Report of coronary calcifications -I personally reviewed her CT lung cancer screening, and she does not have significant coronary calcium on my review. However, it is not a gated scan -with her risk factors and chest pain, we discussed further evaluation -after shared decision making, will pursue coronary CT. Will keep metoprolol succinate, hold AM valsartan to have room for additional metoprolol tartrate and nitroglycerin for the test -bmet ordered -counseled on red flag warning signs that need immediate medical attention -we did discuss role for statins if plaque noted. She is not in favor of more  medications in general but would be open to discussion if plaque noted on CT  Abnormal echo, with reduced EF consistent with nonischemic cardiomyopathy: NYHA class I-II.  -stress test negative for ischemia, remote cath normal -see above, with chest pain will pursue CT -continue metoprolol succinate 25 mg daily, valsartan 40 mg daily. Limited by low blood pressure -clinically appears euvolemic -recheck echo PRN for change in symptoms  Aortic atherosclerosis: incidental on CT scans -continue to discuss aspirin, statin. We spent significant time on this today, see above  Family history of heart disease: no premature disease, but multiple members of the family with ASCVD  Cardiac risk counseling and prevention recommendations: -recommend heart healthy/Mediterranean diet, with whole grains, fruits, vegetable, fish, lean meats, nuts, and olive oil. Limit salt. -recommend moderate walking, 3-5 times/week for 30-50 minutes each session. Aim for at least 150 minutes.week. Goal should be pace of 3 miles/hours, or walking 1.5 miles in 30 minutes -recommend avoidance of tobacco products. Avoid excess alcohol. -ASCVD risk score: The 10-year ASCVD risk score (Arnett DK, et al., 2019) is: 15.8%   Values used to calculate the score:     Age: 84 years     Sex: Female     Is Non-Hispanic African American: No     Diabetic: No     Tobacco smoker: Yes     Systolic Blood Pressure:  130 mmHg     Is BP treated: Yes     HDL Cholesterol: 60.1 mg/dL     Total Cholesterol: 201 mg/dL    Plan for follow up: 1 year or sooner as needed.  Buford Dresser, MD, PhD, [facility]    Medication Adjustments/Labs and Tests Ordered: Current medicines are reviewed at length with the patient today.  Concerns regarding medicines are outlined above.   No orders of the defined types were placed in this encounter.  No orders of the defined types were placed in this encounter.  There are no  Patient Instructions on file for this visit.  I,Mathew Stumpf,acting as a Education administrator for [employer], MD.,have documented all relevant documentation on the behalf of Buford Dresser, MD,as directed by  Buford Dresser, MD while in the presence of Buford Dresser, MD.  ***   Signed, Buford Dresser, MD PhD, [facility] 02/28/2021     [place]

## 2021-02-28 NOTE — Patient Instructions (Addendum)
Medication Instructions:  START ASA 81 MG DAILY  STAR ROSUVASTATIN 5 MG DAILY   *If you need a refill on your cardiac medications before your next appointment, please call your pharmacy*  Lab Work: FASTING LP/HFP IN 3 MONTHS  If you have labs (blood work) drawn today and your tests are completely normal, you will receive your results only by: [place] (if you have MyChart) OR A paper copy in the mail If you have any lab test that is abnormal or we need to change your treatment, we will call you to review the results.  Testing/Procedures: NONE  Follow-Up: At [facility], you and your health needs are our priority.  As part of our continuing mission to provide you with exceptional heart care, we have created designated Provider Care Teams.  These Care Teams include your primary Cardiologist (physician) and Advanced Practice Providers (APPs -  Physician Assistants and Nurse Practitioners) who all work together to provide you with the care you need, when you need it.  We recommend signing up for the patient portal called "MyChart".  Sign up information is provided on this After Visit Summary.  MyChart is used to connect with patients for Virtual Visits (Telemedicine).  Patients are able to view lab/test results, encounter notes, upcoming appointments, etc.  Non-urgent messages can be sent to your provider as well.   To learn more about what you can do with MyChart, go to [email or web address].    Your next appointment:   12 month(s)  The format for your next appointment:   In Person  Provider: Buford Dresser MD

## 2021-03-17 ENCOUNTER — Other Ambulatory Visit (HOSPITAL_BASED_OUTPATIENT_CLINIC_OR_DEPARTMENT_OTHER): Payer: Self-pay | Admitting: *Deleted

## 2021-03-17 DIAGNOSIS — E7800 Pure hypercholesterolemia, unspecified: Secondary | ICD-10-CM

## 2021-03-17 DIAGNOSIS — I2510 Atherosclerotic heart disease of native coronary artery without angina pectoris: Secondary | ICD-10-CM

## 2021-03-17 MED ORDER — ROSUVASTATIN CALCIUM 5 MG PO TABS: 5.0000 mg | ORAL_TABLET | Freq: Every day | ORAL | 3 refills | Status: DC

## 2021-03-17 NOTE — Telephone Encounter (Signed)
Rx(s) sent to pharmacy electronically.  

## 2021-03-21 ENCOUNTER — Telehealth (INDEPENDENT_AMBULATORY_CARE_PROVIDER_SITE_OTHER): Payer: Medicare Other | Admitting: Family Medicine

## 2021-03-21 ENCOUNTER — Encounter: Payer: Self-pay | Admitting: Family Medicine

## 2021-03-21 ENCOUNTER — Other Ambulatory Visit: Payer: Self-pay

## 2021-03-21 VITALS — Ht 65.6 in | Wt 172.0 lb

## 2021-03-21 DIAGNOSIS — M810 Age-related osteoporosis without current pathological fracture: Secondary | ICD-10-CM | POA: Diagnosis not present

## 2021-03-21 NOTE — Progress Notes (Signed)
Virtual visit completed through WebEx or similar program ?Patient location: home  ?Provider location: Financial controller at [facility], office  ?Participants: Patient and me (unless stated otherwise below) ? ?Pandemic considerations d/w pt.  ? ?Limitations and rationale for visit method d/w patient.  Patient agreed to proceed.  ? ?CC: Follow-up regarding osteoporosis. ? ?HPI: ? ?Osteoporosis.  She was prev on boniva.  She can tolerate that.  She didn't tolerate fosamax.   ?She prev took forteo for 18 months.   ?She has not been on prolia.   ? ?D/w pt about DXA results and osteoporosis path/phys in general, including vit D and calcium. D/w pt about risk benefit with treatment, especially GI sx, jaw and long bone pathology.   ? ?She has prev done PT to help with balance.  We talked about weight bearing exercise.  She has chronic changes at the ankle and that contributes.  She can restart her prev home rx for exercise and update me as needed. ? ?She hasn't smoked in 3 years.   ? ?Meds and allergies reviewed.  ? ?ROS: Per HPI unless specifically indicated in ROS section  ? ?NAD ?Speech wnl ? ?A/P: ?Osteoporosis. ?She was prev on boniva.  She can tolerate that.  She didn't tolerate fosamax.   ?She prev took forteo for 18 months.   ?She has not been on prolia.   ? ?D/w pt about DXA results and osteoporosis path/phys in general, including vit D and calcium. D/w pt about risk benefit with treatment, especially GI sx, jaw and long bone pathology.   ? ?I told her that I thought most likely reasonable path going forward to reduce her fracture risk would be to restart Boniva.  I told her I wanted to consider her situation over the next few days and then we would contact her.  See follow-up phone note. ?

## 2021-03-23 ENCOUNTER — Other Ambulatory Visit: Payer: Self-pay | Admitting: Family Medicine

## 2021-03-23 DIAGNOSIS — M810 Age-related osteoporosis without current pathological fracture: Secondary | ICD-10-CM | POA: Insufficient documentation

## 2021-03-23 NOTE — Assessment & Plan Note (Signed)
She was prev on boniva.  She can tolerate that.  She didn't tolerate fosamax.   ?She prev took forteo for 18 months.   ?She has not been on prolia.   ?? ?D/w pt about DXA results and osteoporosis path/phys in general, including vit D and calcium. D/w pt about risk benefit with treatment, especially GI sx, jaw and long bone pathology.   ?? ?I told her that I thought most likely reasonable path going forward to reduce her fracture risk would be to restart Boniva.  I told her I wanted to consider her situation over the next few days and then we would contact her.  See follow-up phone note. ?

## 2021-03-23 NOTE — Telephone Encounter (Signed)
Please call patient.  I do think it makes sense to restart Boniva and plan on taking it for 2 years.  Assuming she takes it for 2 years and tolerates it well, we can recheck a bone density test at that point.  I put a note in the computer about this.  I pended the prescription for Boniva below.  If she consents then I can send the prescription.  Please let me know which pharmacy to see if she prefers. ? ?If she does not tolerate the medication then stop it and please let me know.  Thanks. ?

## 2021-03-24 NOTE — Telephone Encounter (Signed)
Patient is fine with the Boniva rx and wants sent to [facility] on [address].  ?

## 2021-03-25 MED ORDER — IBANDRONATE SODIUM 150 MG PO TABS: 150.0000 mg | ORAL_TABLET | ORAL | 3 refills | Status: DC

## 2021-03-25 NOTE — Telephone Encounter (Signed)
Sent. Thanks.   
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
## 2021-04-05 ENCOUNTER — Encounter: Payer: Self-pay | Admitting: Gastroenterology

## 2021-04-07 ENCOUNTER — Telehealth: Payer: Self-pay | Admitting: *Deleted

## 2021-04-07 NOTE — Telephone Encounter (Signed)
April [name] called back and said that TB test is not required for independent living so it's okay to leave blank ?

## 2021-04-07 NOTE — Telephone Encounter (Signed)
Form has been received; printed and placed in Dr. Carole Civil inbox  ?

## 2021-04-07 NOTE — Telephone Encounter (Signed)
All forms have been faxed as requested ?

## 2021-04-07 NOTE — Telephone Encounter (Signed)
April [name] with [facility] called regarding this pt. Pt is suppose to be out of her house by this Wednesday. [facility] is trying to work quickly to get her into their facility so pt will not be homeless. [name] said that their is a form they need PCP to fill out and sign ASAP and send back to them. They are faxing it over now and just wanted [place] to know it's being faxed so we can get it completed ASAP  ?

## 2021-04-07 NOTE — Telephone Encounter (Signed)
Printed med list and attached to forms. I called wendy back and left a message to call back to see if TB test is needed.  ?

## 2021-04-07 NOTE — Telephone Encounter (Signed)
This will be worked on best we can; I do not have control over when I receive faxes from the fax drive. Will give to Dr. Damita Dunnings as soon as its put on the S drive.  ?

## 2021-04-07 NOTE — Telephone Encounter (Signed)
Form done. Please scan and send.  Please attach med list.  Please let me know if she is going to need a TB test.  Thanks.  ?

## 2021-04-08 ENCOUNTER — Encounter (HOSPITAL_BASED_OUTPATIENT_CLINIC_OR_DEPARTMENT_OTHER): Payer: Self-pay | Admitting: Cardiology

## 2021-04-15 DIAGNOSIS — H35033 Hypertensive retinopathy, bilateral: Secondary | ICD-10-CM | POA: Diagnosis not present

## 2021-04-15 DIAGNOSIS — I10 Essential (primary) hypertension: Secondary | ICD-10-CM | POA: Diagnosis not present

## 2021-04-15 DIAGNOSIS — H43813 Vitreous degeneration, bilateral: Secondary | ICD-10-CM | POA: Diagnosis not present

## 2021-04-16 ENCOUNTER — Ambulatory Visit: Payer: Medicare Other | Admitting: Physician Assistant

## 2021-04-16 ENCOUNTER — Telehealth: Payer: Self-pay

## 2021-04-16 DIAGNOSIS — Z8709 Personal history of other diseases of the respiratory system: Secondary | ICD-10-CM

## 2021-04-16 DIAGNOSIS — M810 Age-related osteoporosis without current pathological fracture: Secondary | ICD-10-CM

## 2021-04-16 DIAGNOSIS — M316 Other giant cell arteritis: Secondary | ICD-10-CM

## 2021-04-16 DIAGNOSIS — I10 Essential (primary) hypertension: Secondary | ICD-10-CM

## 2021-04-16 DIAGNOSIS — Z96612 Presence of left artificial shoulder joint: Secondary | ICD-10-CM

## 2021-04-16 DIAGNOSIS — M5136 Other intervertebral disc degeneration, lumbar region: Secondary | ICD-10-CM

## 2021-04-16 DIAGNOSIS — Z87891 Personal history of nicotine dependence: Secondary | ICD-10-CM

## 2021-04-16 DIAGNOSIS — Z8639 Personal history of other endocrine, nutritional and metabolic disease: Secondary | ICD-10-CM

## 2021-04-16 DIAGNOSIS — F419 Anxiety disorder, unspecified: Secondary | ICD-10-CM

## 2021-04-16 DIAGNOSIS — M353 Polymyalgia rheumatica: Secondary | ICD-10-CM

## 2021-04-16 DIAGNOSIS — Z8781 Personal history of (healed) traumatic fracture: Secondary | ICD-10-CM

## 2021-04-16 DIAGNOSIS — Z96643 Presence of artificial hip joint, bilateral: Secondary | ICD-10-CM

## 2021-04-16 NOTE — Progress Notes (Signed)
? ? ?  Chronic Care Management ?Pharmacy Assistant  ? ?Name: April [name]  MRN: [id number] DOB: 1955/12/02 ? ?Reason for Encounter: CCM Counsellor) ?  ?Medications: ?Outpatient Encounter Medications as of 04/16/2021  ?Medication Sig  ? acetaminophen (TYLENOL) 500 MG tablet Take 1,000 mg by mouth every 6 (six) hours as needed for moderate pain or headache.  ? albuterol (PROVENTIL) (2.5 MG/3ML) 0.083% nebulizer solution Take 3 mLs (2.5 mg total) by nebulization every 6 (six) hours as needed for wheezing or shortness of breath.  ? albuterol (VENTOLIN HFA) 108 (90 Base) MCG/ACT inhaler Inhale 2 puffs into the lungs every 6 (six) hours as needed for wheezing or shortness of breath. Okay to dispense proair/Ventolin/albuterol.  ? ALPRAZolam (XANAX) 0.5 MG tablet Take 0.5-1 tablets (0.25-0.5 mg total) by mouth 2 (two) times daily as needed. for anxiety  ? aspirin EC 81 MG tablet Take 1 tablet (81 mg total) by mouth daily. Swallow whole.  ? Budeson-Glycopyrrol-Formoterol (BREZTRI AEROSPHERE) 160-9-4.8 MCG/ACT AERO Inhale 2 puffs into the lungs every 12 (twelve) hours.  ? buPROPion (WELLBUTRIN XL) 300 MG 24 hr tablet TAKE 1 TABLET BY MOUTH  DAILY  ? Calcium 500 MG CHEW Chew 500 mg by mouth 2 (two) times daily.   ? Cholecalciferol (DIALYVITE VITAMIN D 5000) 125 MCG (5000 UT) capsule 1 tab a day except for 2 on Sundays  ? fluticasone (FLONASE) 50 MCG/ACT nasal spray Place 2 sprays into both nostrils daily as needed for allergies or rhinitis.  ? ibandronate (BONIVA) 150 MG tablet Take 1 tablet (150 mg total) by mouth every 30 (thirty) days. Take in the morning with a full glass of water, on an empty stomach, and do not take anything else by mouth or lie down for the next 30 min.  ? levothyroxine (SYNTHROID) 125 MCG tablet TAKE 1 TABLET BY MOUTH  DAILY EXCEPT TAKE 1 AND 1/2 TABLETS BY MOUTH ON SUNDAY  ? metoprolol succinate (TOPROL-XL) 25 MG 24 hr tablet TAKE 1 TABLET BY MOUTH  DAILY  ? Polyethyl Glycol-Propyl Glycol  (SYSTANE OP) Place 1 drop into both eyes every morning.  ? rosuvastatin (CRESTOR) 5 MG tablet Take 1 tablet (5 mg total) by mouth daily.  ? Spacer/Aero-Holding Chambers (AEROCHAMBER MV) inhaler Use as instructed  ? tiZANidine (ZANAFLEX) 4 MG tablet TAKE 1/2 TO 1 TABLET BY  MOUTH EVERY 6 HOURS AS  NEEDED FOR MUSCLE SPASMS  ? UNABLE TO FIND Take 2 each by mouth at bedtime. Med Name: Hemp gummies  ? valsartan (DIOVAN) 40 MG tablet TAKE 1 TABLET BY MOUTH  DAILY  ? vitamin C (ASCORBIC ACID) 250 MG tablet Take 500 mg by mouth daily.  ? ?No facility-administered encounter medications on file as of 04/16/2021.  ? ?April [name] was contacted to remind of upcoming telephone visit with [name] on 04/21/2021 at 11:00. Patient was reminded to have any blood glucose and blood pressure readings available for review at appointment.  ? ?Patient confirmed appointment. ? ?Are you having any problems with your medications? No  ? ?Do you have any concerns you like to discuss with the pharmacist? No ? ?Star Rating Drugs: ?Medication:  Last Fill: Day Supply ?Rosuvastatin 5 mg 02/28/2021 90 ?Valsartan 40 mg 03/12/2021 100 ? ?Patient requested for her address to be changed: ?[address] ?[address] ?[place], [place] [address] ? ?[name], CPP notified ? ?Marijean Niemann, RMA ?Clinical Pharmacy Assistant ?[phone] ? ? ? ? ? ?

## 2021-04-21 ENCOUNTER — Telehealth: Payer: Self-pay | Admitting: Pharmacist

## 2021-04-21 ENCOUNTER — Ambulatory Visit (INDEPENDENT_AMBULATORY_CARE_PROVIDER_SITE_OTHER): Payer: Medicare Other | Admitting: Pharmacist

## 2021-04-21 DIAGNOSIS — I10 Essential (primary) hypertension: Secondary | ICD-10-CM

## 2021-04-21 DIAGNOSIS — M810 Age-related osteoporosis without current pathological fracture: Secondary | ICD-10-CM

## 2021-04-21 DIAGNOSIS — J449 Chronic obstructive pulmonary disease, unspecified: Secondary | ICD-10-CM

## 2021-04-21 DIAGNOSIS — F341 Dysthymic disorder: Secondary | ICD-10-CM

## 2021-04-21 DIAGNOSIS — I700 Atherosclerosis of aorta: Secondary | ICD-10-CM

## 2021-04-21 NOTE — Telephone Encounter (Signed)
Patient returned call. See CCM encounter 

## 2021-04-21 NOTE — Telephone Encounter (Signed)
?  Chronic Care Management  ? ?Outreach Note ? ?04/21/2021 ?Name: April [name] MRN: [id number] DOB: 1955/02/05 ? ?Referred by: [name], MD ? ?Patient had a phone appointment scheduled with clinical pharmacist today. ? ?An unsuccessful telephone outreach was attempted today. The patient was referred to the pharmacist for assistance with medications, care management and care coordination.  ? ?Patient will NOT be penalized in any way for missing a CCM appointment. The no-show fee does not apply. ? ?If possible, a message was left to return call to: [phone] or to [facility]. ? ?[name], PharmD, BCACP ?Clinical Pharmacist ?[facility] ?[phone] ? ? ?

## 2021-04-21 NOTE — Progress Notes (Signed)
? ?Chronic Care Management ?Pharmacy Note ? ?04/29/2021 ?Name:  April [name] MRN:  [id number] DOB:  [birth year] ? ?Summary: CCM F/U visit ?-Pt reports trouble sleeping sometimes. Feeling fatigued overall. Discussed metoprolol may be contributing to fatigue. She is also taking bupropion at night which can lead to insomnia. ?-Pt started rosuvastatin 02/2021. Repeat lipid panel ordered for May 2023 by cardiology. ? ?Recommendations/Changes made from today's visit: ?-Advised to move bupropion to AM ?-Advised to get repeat labwork (lipids, LFT) at cardiology office around 05/28/21 ? ?Plan: ?-[place] will call patient 1 month for update on sleep/lipids ?-Pharmacist follow up televisit scheduled for 6 months ? ? ? ?Subjective: ?April [name] is an 66 y.o. year old female who is a primary patient of Damita Dunnings, Elveria Rising, MD.  The CCM team was consulted for assistance with disease management and care coordination needs.   ? ?Engaged with patient by telephone for follow up visit in response to provider referral for pharmacy case management and/or care coordination services.  ? ?Consent to Services:  ?The patient was given information about Chronic Care Management services, agreed to services, and gave verbal consent prior to initiation of services.  Please see initial visit note for detailed documentation.  ? ?Patient Care Team: ?[name], MD as PCP - General (Family Medicine) ?Buford Dresser, MD as PCP - Cardiology (Cardiology) ?Bo Merino, MD as Consulting Physician (Rheumatology) ?Jaylyn Booher, [place], [facility] as Pharmacist (Pharmacist) ? ?Recent office visits: ?03/21/21 Dr Damita Dunnings VV: f/u osteoporosis. Did not tolerate Fosamax. Was previously on Forteo x 18 months. Not tried Prolia. Most reasonable option is to restart Boniva. ? ?Recent consult visits: ?02/28/21 Dr Harrell Gave (Cardiology): f/u CAD. S/p coronary CT w/ aortic atherosclerosis. Rx rosuvastatin 5 mg, aspirin 81 mg. Repeat lipids, LFT  2-3 months. ? ?01/31/21 Dr Harrell Gave (cardiology): eval precordial pain. Ordered coronary CT. Rx'd metoprolol for procedure. ? ?01/14/21 Dr Estanislado Pandy (Rheumatology): f/u temporal arteritis, Polymyalgia rheumatica. No changes.  ? ?Hospital visits: ?None in previous 6 months ? ? ?Objective: ? ?Lab Results  ?Component Value Date  ? CREATININE 1.09 (H) 01/31/2021  ? BUN 15 01/31/2021  ? GFR 41.29 (L) 08/25/2019  ? EGFR 56 (L) 01/31/2021  ? GFRNONAA 52 (L) 09/26/2020  ? GFRAA 50 (L) 06/13/2019  ? NA 138 01/31/2021  ? K 4.4 01/31/2021  ? CALCIUM 10.2 01/31/2021  ? CO2 22 01/31/2021  ? GLUCOSE 99 01/31/2021  ? ? ?Lab Results  ?Component Value Date/Time  ? HGBA1C 6.1 11/21/2020 09:39 AM  ? GFR 41.29 (L) 08/25/2019 08:15 AM  ? GFR 45.02 (L) 08/23/2018 08:38 AM  ?  ?Last diabetic Eye exam: No results found for: HMDIABEYEEXA  ?Last diabetic Foot exam: No results found for: HMDIABFOOTEX  ? ?Lab Results  ?Component Value Date  ? CHOL 201 (H) 11/21/2020  ? HDL 60.10 11/21/2020  ? [place] 128 (H) 11/21/2020  ? TRIG 65.0 11/21/2020  ? CHOLHDL 3 11/21/2020  ? ? ? ?  Latest Ref Rng & Units 09/26/2020  ?  7:41 PM 08/25/2019  ?  8:15 AM 08/23/2018  ?  8:38 AM  ?Hepatic Function  ?Total Protein 6.5 - 8.1 g/dL 7.3   7.3   7.5    ?Albumin 3.5 - 5.0 g/dL 4.0   4.2   4.4    ?AST 15 - 41 U/L _0 ?ALT 0 - 44 U/L _1 ?Alk Phosphatase 38 -  126 U/L 90   100   95    ?Total Bilirubin 0.3 - 1.2 mg/dL 1.0   0.4   0.3    ? ? ?Lab Results  ?Component Value Date/Time  ? TSH 1.53 11/21/2020 09:39 AM  ? TSH 1.08 11/20/2019 08:18 AM  ? FREET4 1.01 06/28/2017 04:38 PM  ? FREET4 1.41 03/30/2011 08:44 AM  ? ? ? ?  Latest Ref Rng & Units 09/26/2020  ?  7:41 PM 05/24/2020  ?  2:10 PM 08/25/2019  ?  8:15 AM  ?CBC  ?WBC 4.0 - 10.5 K/uL 6.6   6.7   7.0    ?Hemoglobin 12.0 - 15.0 g/dL 13.8   13.1   13.1    ?Hematocrit 36.0 - 46.0 % 41.6   40.5   39.5    ?Platelets 150 - 400 K/uL 214   203   210.0    ? ? ?Lab Results  ?Component Value Date/Time  ?  VD25OH 68.99 11/21/2020 09:39 AM  ? VD25OH 29.76 (L) 11/20/2019 08:18 AM  ? ? ?Clinical ASCVD: Yes  ?The 10-year ASCVD risk score (Arnett DK, et al., 2019) is: 17.1% ?  Values used to calculate the score: ?    Age: 10 years ?    Sex: Female ?    Is Non-Hispanic African American: No ?    Diabetic: No ?    Tobacco smoker: Yes ?    Systolic Blood Pressure: 195 mmHg ?    Is BP treated: Yes ?    HDL Cholesterol: 60.1 mg/dL ?    Total Cholesterol: 201 mg/dL   ? ? ?  11/25/2020  ? 10:16 AM 11/22/2019  ? 12:18 PM 07/17/2019  ? 12:42 PM  ?Depression screen PHQ 2/9  ?Decreased Interest 0 0 0  ?Down, Depressed, Hopeless 0 0 0  ?PHQ - 2 Score 0 0 0  ?Altered sleeping 0 0 0  ?Tired, decreased energy 0 0 0  ?Change in appetite 0 0 0  ?Feeling bad or failure about yourself  0 0 0  ?Trouble concentrating 0 0 0  ?Moving slowly or fidgety/restless 0 0 0  ?Suicidal thoughts 0 0 0  ?PHQ-9 Score 0 0 0  ?Difficult doing work/chores Not difficult at all Not difficult at all Not difficult at all  ?  ? ?Social History  ? ?Tobacco Use  ?Smoking Status Former  ? Packs/day: 0.33  ? Years: 34.00  ? Pack years: 11.22  ? Types: Cigarettes  ? Quit date: 10/24/2017  ? Years since quitting: 3.5  ? Passive exposure: Past (dad smoked)  ?Smokeless Tobacco Never  ?Tobacco Comments  ? quit smoking october 2019  ? ?BP Readings from Last 3 Encounters:  ?02/28/21 130/72  ?02/12/21 131/86  ?01/31/21 132/82  ? ?Pulse Readings from Last 3 Encounters:  ?02/28/21 97  ?02/12/21 70  ?01/31/21 80  ? ?Wt Readings from Last 3 Encounters:  ?04/22/21 173 lb (78.5 kg)  ?03/21/21 172 lb (78 kg)  ?02/28/21 176 lb 3.2 oz (79.9 kg)  ? ?BMI Readings from Last 3 Encounters:  ?04/22/21 27.50 kg/m?  ?03/21/21 28.10 kg/m?  ?02/28/21 28.01 kg/m?  ? ? ?Assessment/Interventions: Review of patient past medical history, allergies, medications, health status, including review of consultants reports, laboratory and other test data, was performed as part of comprehensive evaluation and  provision of chronic care management services.  ? ?SDOH:  (Social Determinants of Health) assessments and interventions performed: No ? ?SDOH Screenings  ? ?Alcohol Screen: Not on  file  ?Depression (PHQ2-9): Low Risk   ? PHQ-2 Score: 0  ?Financial Resource Strain: Low Risk   ? Difficulty of Paying Living Expenses: Not very hard  ?Food Insecurity: Not on file  ?Housing: Not on file  ?Physical Activity: Not on file  ?Social Connections: Not on file  ?Stress: Not on file  ?Tobacco Use: Medium Risk  ? Smoking Tobacco Use: Former  ? Smokeless Tobacco Use: Never  ? Passive Exposure: Past  ?Transportation Needs: Not on file  ? ? ?[place] ? ?Allergies  ?Allergen Reactions  ? Sulfonamide Derivatives   ?  As a child.  Throat closed, rash.  ? Alendronate Sodium   ?  GI upset  ? Dilaudid [Hydromorphone Hcl] Nausea And Vomiting  ? Other   ?  Adhesive ?  ? Wound Dressing Adhesive   ?  Redness, adhesive tapes. Needs PAPER TAPE.  ? ? ?Medications Reviewed Today   ? ? Reviewed by Charlton Haws, RPH (Pharmacist) on 04/29/21 at 1027  Med List Status: <None>  ? ?Medication Order Taking? Sig Documenting Provider Last Dose Status Informant  ?acetaminophen (TYLENOL) 500 MG tablet [id number] Yes Take 1,000 mg by mouth every 6 (six) hours as needed for moderate pain or headache. [provider] Taking Active Self  ?albuterol (VENTOLIN HFA) 108 (90 Base) MCG/ACT inhaler [id number] Yes Inhale 2 puffs into the lungs every 6 (six) hours as needed for wheezing or shortness of breath. Okay to dispense proair/Ventolin/albuterol. Garner Nash, DO Taking Active   ?ALPRAZolam (XANAX) 0.5 MG tablet [id number] Yes TAKE 1/2 TO 1 TABLET BY MOUTH  TWICE DAILY AS NEEDED FOR  ANXIETY [name], MD Taking Active   ?aspirin EC 81 MG tablet [id number] Yes Take 1 tablet (81 mg total) by mouth daily. Swallow whole. Buford Dresser, MD Taking Active   ?Budeson-Glycopyrrol-Formoterol (BREZTRI AEROSPHERE) 160-9-4.8 MCG/ACT AERO  [id number] Yes Inhale 2 puffs into the lungs every 12 (twelve) hours. Garner Nash, DO Taking Active   ?buPROPion (WELLBUTRIN XL) 300 MG 24 hr tablet [id number] Yes TAKE 1 TABLET BY MOUTH  DAILY Lorrin Jackson

## 2021-04-22 ENCOUNTER — Ambulatory Visit (AMBULATORY_SURGERY_CENTER): Payer: Medicare Other | Admitting: *Deleted

## 2021-04-22 ENCOUNTER — Other Ambulatory Visit: Payer: Self-pay

## 2021-04-22 VITALS — Ht 66.5 in | Wt 173.0 lb

## 2021-04-22 DIAGNOSIS — Z86010 Personal history of colonic polyps: Secondary | ICD-10-CM

## 2021-04-22 MED ORDER — PEG 3350-KCL-NA BICARB-NACL 420 G PO SOLR: 4000.0000 mL | Freq: Once | ORAL | 0 refills | Status: AC

## 2021-04-22 NOTE — Progress Notes (Signed)
No egg or soy allergy known to patient  ?No issues known to pt with past sedation with any surgeries or procedures ?Patient denies ever being told they had issues or difficulty with intubation  ?No FH of Malignant Hyperthermia ?Pt is not on diet pills ?Pt is not on  home 02  ?Pt is not on blood thinners  ?Pt denies issues with constipation  ?No A fib or A flutter ? ? Coupon to pt in PV today , Code to Pharmacy and  NO PA's for preps discussed with pt In PV today  ?Discussed with pt there will be an out-of-pocket cost for prep and that varies from $0 to 70 +  dollars - pt verbalized understanding  ?Pt instructed to use Singlecare.com or GoodRx for a price reduction on prep  ? ?PV completed over the phone. Pt verified name, DOB, address and insurance during PV today.  ?Pt mailed instruction packet with copy of consent form to read and not return, and instructions.  ?Pt encouraged to call with questions or issues.  ?If pt has My chart, procedure instructions sent via My Chart   ?

## 2021-04-25 ENCOUNTER — Encounter: Payer: Self-pay | Admitting: Gastroenterology

## 2021-04-27 ENCOUNTER — Other Ambulatory Visit: Payer: Self-pay | Admitting: Family Medicine

## 2021-04-27 DIAGNOSIS — I428 Other cardiomyopathies: Secondary | ICD-10-CM

## 2021-04-28 NOTE — Telephone Encounter (Signed)
Refill request for Alprazolam 0.5 mg tabs ? ?LOV - 03/21/21 ?Next OV - not scheduled ?Last refill - 11/25/20 #60/3 ? ?

## 2021-04-29 NOTE — Patient Instructions (Signed)
Visit Information ? ?Phone number for Pharmacist: [phone] ? ? Goals Addressed   ?None ?  ? ? ?Care Plan : [place]  ?Updates made by Charlton Haws, [place] since 04/29/2021 12:00 AM  ?  ? ?Problem: Hypertension, Hyperlipidemia, COPD, Depression, Anxiety, and Osteoporosis   ?Priority: High  ?  ? ?Long-Range Goal: Disease Management   ?Start Date: 11/04/2020  ?Expected End Date: 04/30/2022  ?This Visit's Progress: On track  ?Priority: High  ?Note:   ?Current Barriers:  ?Fatigue/trouble sleeping ? ?Pharmacist Clinical Goal(s):  ?Patient will contact provider office for questions/concerns as evidenced notation of same in electronic health record through collaboration with PharmD and provider.  ? ?Interventions: ?1:1 collaboration with [name], MD regarding development and update of comprehensive plan of care as evidenced by provider attestation and co-signature ?Inter-disciplinary care team collaboration (see longitudinal plan of care) ?Comprehensive medication review performed; medication list updated in electronic medical record ? ?Hypertension (BP goal <140/90) ?-Controlled - BP at goal in recent office visits; pt has cut back on salt intake ?-Pt c/o of dizziness, she is not sure if it is vertigo or BP-related; she has not checked  ?-Current treatment: ?Metoprolol succinate 25 mg daily - Appropriate, Effective, Safe, Accessible ?Valsartan 40 mg daily - Appropriate, Effective, Safe, Accessible ?-Patient has failed these meds in the past: amlodipine ?-Medications previously tried: n/a ?-Current dietary habits: limits caffeine/salt ?-Current exercise habits: not discussed today ?-Educated on BP goals and benefits of medications for prevention of heart attack, stroke and kidney damage; Proper BP monitoring technique ?-Counseled to monitor BP at home weekly AND if feeling dizzy ?-Recommended to continue current medication ? ?Hyperlipidemia: (LDL goal < 70) ?-Query controlled - LDL 128  (11/2020) above goal, rosuvastatin was started 02/2021 per cardiology, lipids have not been rechecked yet - cardiology had recommended labs in 3 months and has already ordered lipids/LFTs ?-s/p Coronary CT - aortic atherosclerosis ?-Current treatment: ?Rosuvastatin 5 mg daily - Appropriate, Query Effective ?Aspirin 81 mg daily - Appropriate, Effective, Safe, Accessible ?-Medications previously tried: n/a  ?-Educated on Cholesterol goals; Benefits of statin for ASCVD risk reduction; ?-Recommended to continue current medication; recheck lipids ~05/28/21 at cardiology office (pt can schedule lab appt) ? ?COPD (Goal: control symptoms and prevent exacerbations) ?-Controlled ?-Gold Grade: Gold 4 (FEV1<40%) ?-Pulmonary function testing: 12/2018 FEV1 34% predicted ?-Exacerbations requiring treatment in last 6 months: 1  ?-Current treatment  ?Breztri (PAP) - 2 puffs every 12 hours - Appropriate, Effective, Safe, Accessibl ?Albuterol - 2 puffs every 6 hours as needed ?Azithromycin 250 mg - 1 tablet on Mon, Wed, and Friday ?-Medications previously tried: Advair, Spiriva, Allegra ?-Patient reports consistent use of maintenance inhaler ?-Frequency of rescue inhaler use: 3-5 times/week ?-Recommended to continue current medication ? ?Osteoporosis (Goal: prevent fracture) ?-Controlled - pt started ibandronate 04/18/21, tolerating so far ?-Last DEXA Scan: 02/2021  ? T-Score lumbar spine: -2.7 ? T-Score forearm radius: -1.8 ?-Patient is a candidate for pharmacologic treatment due to T-Score < -2.5 in lumbar spine ?-Current treatment  ?Ibandronate 150 mg monthly ?-Medications previously tried: Forteo x 18 months, Foasmax (GI effects)  ?-Recommend weight-bearing and muscle strengthening exercises for building and maintaining bone density. ?-Recommended to continue current medication ? ?Depression/Anxiety (Goal: manage symptoms) ?-Not ideally controlled - pt reports improvement in mood on bupropion, but she is taking it at night and reports  trouble sleeping/fatigue during the day ?-PHQ9: 0 (11/2020) - minimal depression ?-Follows with PCP for mental health support ?-Current treatment  ?Bupropion  XL 300 mg daily PM - Query Appropriate, Effective, Safe, Accessible ?Alprazolam 0.5 mg - 1/2-1 tab BID prn ?-Medications previously tried: n/a  ?-Recommended to continue current medication - advised to move bupropion to AM ? ? ?Patient Goals/Self-Care Activities ?Patient will:  ?- take medications as prescribed as evidenced by patient report and record review ?focus on medication adherence by routine ? ?  ?  ? ?Patient verbalizes understanding of instructions and care plan provided today and agrees to view in [place]. Active MyChart status confirmed with patient.   ?Telephone follow up appointment with pharmacy team member scheduled for: 6 months ? ?[name], PharmD, BCACP ?Clinical Pharmacist ?[facility] ?[phone] ?  ?

## 2021-04-30 ENCOUNTER — Other Ambulatory Visit: Payer: Self-pay | Admitting: *Deleted

## 2021-04-30 MED ORDER — AZITHROMYCIN 250 MG PO TABS: 250.0000 mg | ORAL_TABLET | ORAL | 3 refills | Status: DC

## 2021-04-30 MED ORDER — ALBUTEROL SULFATE HFA 108 (90 BASE) MCG/ACT IN AERS: 2.0000 | INHALATION_SPRAY | Freq: Four times a day (QID) | RESPIRATORY_TRACT | 3 refills | Status: DC | PRN
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
## 2021-05-03 ENCOUNTER — Other Ambulatory Visit: Payer: Self-pay

## 2021-05-03 NOTE — Telephone Encounter (Signed)
Received request to move to mail order pharmacy.  ?

## 2021-05-04 DIAGNOSIS — E785 Hyperlipidemia, unspecified: Secondary | ICD-10-CM

## 2021-05-04 DIAGNOSIS — M818 Other osteoporosis without current pathological fracture: Secondary | ICD-10-CM | POA: Diagnosis not present

## 2021-05-04 DIAGNOSIS — F32A Depression, unspecified: Secondary | ICD-10-CM

## 2021-05-04 DIAGNOSIS — J449 Chronic obstructive pulmonary disease, unspecified: Secondary | ICD-10-CM | POA: Diagnosis not present

## 2021-05-04 DIAGNOSIS — I10 Essential (primary) hypertension: Secondary | ICD-10-CM

## 2021-05-05 MED ORDER — IBANDRONATE SODIUM 150 MG PO TABS: 150.0000 mg | ORAL_TABLET | ORAL | 3 refills | Status: DC

## 2021-05-06 ENCOUNTER — Encounter: Payer: Medicare Other | Admitting: Gastroenterology

## 2021-05-07 ENCOUNTER — Encounter: Payer: Self-pay | Admitting: Nurse Practitioner

## 2021-05-07 ENCOUNTER — Ambulatory Visit (INDEPENDENT_AMBULATORY_CARE_PROVIDER_SITE_OTHER): Payer: Medicare Other

## 2021-05-07 ENCOUNTER — Ambulatory Visit: Payer: Medicare Other | Admitting: Nurse Practitioner

## 2021-05-07 VITALS — BP 124/76 | HR 96 | Temp 97.9°F | Ht 66.0 in | Wt 171.2 lb

## 2021-05-07 DIAGNOSIS — J209 Acute bronchitis, unspecified: Secondary | ICD-10-CM

## 2021-05-07 DIAGNOSIS — J441 Chronic obstructive pulmonary disease with (acute) exacerbation: Secondary | ICD-10-CM | POA: Diagnosis not present

## 2021-05-07 DIAGNOSIS — Z5181 Encounter for therapeutic drug level monitoring: Secondary | ICD-10-CM

## 2021-05-07 DIAGNOSIS — R059 Cough, unspecified: Secondary | ICD-10-CM | POA: Diagnosis not present

## 2021-05-07 DIAGNOSIS — R058 Other specified cough: Secondary | ICD-10-CM | POA: Diagnosis not present

## 2021-05-07 DIAGNOSIS — J440 Chronic obstructive pulmonary disease with acute lower respiratory infection: Secondary | ICD-10-CM

## 2021-05-07 DIAGNOSIS — R0602 Shortness of breath: Secondary | ICD-10-CM | POA: Diagnosis not present

## 2021-05-07 MED ORDER — PREDNISONE 20 MG PO TABS: 40.0000 mg | ORAL_TABLET | Freq: Every day | ORAL | 0 refills | Status: AC

## 2021-05-07 NOTE — Patient Instructions (Addendum)
Continue Breztri 2 puffs Twice daily. Brush tongue and rinse mouth afterwards ?Continue Albuterol inhaler 2 puffs or 3 mL neb every 6 hours as needed for shortness of breath or wheezing. Notify if symptoms persist despite rescue inhaler/neb use. ?Continue azithromycin 250 mg Monday, Wednesday, and Friday ?Continue flonase 2 sprays each nostril daily  ?Continue allegra daily for allergies  ? ?Saline nasal irrigation 1-2 times a day until symptoms improve  ?Mucinex DM 600 mg twice daily for cough/congestion ?Chlorpheniramine 4 mg tab At bedtime for cough  ?Prednisone 40 mg for 5 days. Take in AM with food. ? ?Chest x ray today. We will notify you of any abnormal results. ? ?Follow up in one month on 6/9 with Katie Kenia Teagarden,NP as previously scheduled. If symptoms do not improve or worsen, please contact office for sooner follow up or seek emergency care. ? ?

## 2021-05-07 NOTE — Assessment & Plan Note (Signed)
Mild flare in symptoms likely related to URI or allergies.  Suspect her cough is primarily related to upper airway irritation.  Will treat with prednisone burst.  Advised to continue on triple therapy and with macrolide therapy.  EKG today with normal QTc.  CXR to rule out superimposed infection. ? ?Patient Instructions  ?Continue Breztri 2 puffs Twice daily. Brush tongue and rinse mouth afterwards ?Continue Albuterol inhaler 2 puffs or 3 mL neb every 6 hours as needed for shortness of breath or wheezing. Notify if symptoms persist despite rescue inhaler/neb use. ?Continue azithromycin 250 mg Monday, Wednesday, and Friday ?Continue flonase 2 sprays each nostril daily  ?Continue allegra daily for allergies  ? ?Saline nasal irrigation 1-2 times a day until symptoms improve  ?Mucinex DM 600 mg twice daily for cough/congestion ?Chlorpheniramine 4 mg tab At bedtime for cough  ?Prednisone 40 mg for 5 days. Take in AM with food. ? ?Chest x ray today. We will notify you of any abnormal results. ? ?Follow up in one month on 6/9 with April Reyhan Moronta,NP as previously scheduled. If symptoms do not improve or worsen, please contact office for sooner follow up or seek emergency care. ? ? ? ?

## 2021-05-07 NOTE — Assessment & Plan Note (Addendum)
Upper airway irritation with laryngitis and dry cough.  Advised postnasal drainage and cough control measures.  Counseled on voice rest, avoiding throat clearing.  Frequent sips of water, suck on sugar-free hard candies throughout the day, warm liquids to soothe the throat.  Pred burst. ?

## 2021-05-07 NOTE — Progress Notes (Signed)
Please notify pt CXR clear. Thanks.

## 2021-05-07 NOTE — Progress Notes (Signed)
? ?'@Patient'$  ID: April [name], female    DOB: April 28, 1955, 66 y.o.   MRN: [id number] ? ?Chief Complaint  ?Patient presents with  ? Acute Visit  ?  Pt is here because she belives she has bronchitits. Pt states she does have cough when she lies down but is not productive. Pt states that her SOB is getting worse. She states that she is using her albuterol more then ordered. She is on [employer] inhaler. She states that all this started 3 days ago.   ? ? ?Referring provider: ?[name], MD ? ?HPI: ?66 year old female, former smoker (34-year history) followed for severe COPD and pulmonary nodules.  She is a patient Dr. Juline Patch and last seen in office on 12/20/2020.  She is also followed by the lung cancer screening program.  Past medical history significant for nonischemic cardiomyopathy, chronic systolic heart failure, atherosclerosis, allergic rhinitis, GERD, hypothyroidism, OA, DDD, CKD stage III, avascular necrosis of right hip 2014, anxiety/depression, vitamin D deficiency. ? ?TEST/EVENTS:  ?12/07/2018 PFTs: FVC 56, FEV1 34, ratio 47, TLC 123, DLCOunc 49.  Severe obstructive airway disease with severe diffusion defect. ?11/15/2019 echocardiogram: EF 55 to 60%.  Diastolic parameters were normal.  RV size and function was normal.  No valvular dysfunction was evident. ?12/11/2020 CT chest lung cancer screening: No LAD is present.  Lung RADS 2, benign appearance with 2 solid nodules in the right upper lobe.  CAD and atherosclerosis present.  Moderate emphysema. ? ?12/20/2020: OV with Dr. Valeta Harms.  Had [place] in October and has had progressive DOE since.  For the past 2 weeks she has been without her azithromycin that she has been taking Monday Wednesday Friday; restarted at this visit.  Continued on Breztri.  Suspected that DOE was multifactorial given heart failure and other comorbidities.  Follow-up 6 months. ? ?05/07/2021: Today-acute visit ?Patient presents today for concern for bronchitis.  She reports that over the  last 3 days she has developed a primarily dry cough, worse when she lies down at night.  Symptoms initially started as what she thought were allergies with increased nasal drainage and postnasal drip.  These have mostly improved ; still has some postnasal drainage and hoarseness.  Also feels like her dyspnea is worse than her baseline.  She has been using her albuterol multiple times throughout the day.  She denies any interim sick exposures, recent fevers, headaches, hemoptysis, lower extremity swelling.  She continues on Breztri twice daily and azithromycin Monday Wednesday Friday.  She has not required any prednisone therapy since November when she had COVID. ? ?Allergies  ?Allergen Reactions  ? Sulfonamide Derivatives   ?  As a child.  Throat closed, rash.  ? Alendronate Sodium   ?  GI upset  ? Dilaudid [Hydromorphone Hcl] Nausea And Vomiting  ? Other   ?  Adhesive ?  ? Wound Dressing Adhesive   ?  Redness, adhesive tapes. Needs PAPER TAPE.  ? ? ?Immunization History  ?Administered Date(s) Administered  ? Fluad Quad(high Dose 65+) 08/26/2018, 11/24/2019, 09/30/2020  ? Influenza Split 10/15/2010  ? Influenza,inj,Quad PF,6+ Mos 10/05/2012, 10/24/2013, 10/12/2014, 11/12/2015, 10/16/2016, 10/14/2017  ? Influenza-Unspecified 08/26/2018  ? PFIZER(Purple Top)SARS-COV-2 Vaccination 03/05/2019, 04/04/2019, 10/27/2019, 07/30/2020  ? Pneumococcal Conjugate-13 10/26/2018  ? Pneumococcal Polysaccharide-23 01/05/2009, 11/24/2019  ? Tdap 04/06/2011  ? Zoster Recombinat (Shingrix) 09/05/2020  ? ? ?Past Medical History:  ?Diagnosis Date  ? Allergy   ? Anemia   ? Anxiety   ? Anxiety and depression   ? related  to caring for mother during terminal illness  ? Arthritis   ? Asthma   ? AVN (avascular necrosis of bone) ([facility])   ? hip and wrist  ? Bronchitis   ? hx of  ? Cataract   ? Chronic kidney disease   ? COPD (chronic obstructive pulmonary disease) ([place])   ? Depression   ? Emphysema of lung ([facility])   ? Endometriosis   ? FH: CAD  (coronary artery disease)   ? GERD (gastroesophageal reflux disease)   ? ocassional  ? History of blood transfusion   ? after hip repackment - broke out in hives and started itching  ? History of palpitations   ? Hyperlipidemia   ? Hypertension   ? Hypothyroid   ? Non-ischemic cardiomyopathy ([facility]) 2019  ? Osteopenia   ? forteo through Dr. Estanislado Pandy (started 10/12)  ? Osteoporosis   ? PMR (polymyalgia rheumatica) ([facility])   ? SIRS (systemic inflammatory response syndrome) ([place]) 10/2017  ? Smoker   ? SOB (shortness of breath) on exertion   ? Squamous cell carcinoma   ? facial, 2016  ? Temporal arteritis (HCC)   ? s/p prednisone taper  ? ? ?Tobacco History: ?Social History  ? ?Tobacco Use  ?Smoking Status Former  ? Packs/day: 0.33  ? Years: 34.00  ? Pack years: 11.22  ? Types: Cigarettes  ? Quit date: 10/24/2017  ? Years since quitting: 3.5  ? Passive exposure: Past (dad smoked)  ?Smokeless Tobacco Never  ?Tobacco Comments  ? quit smoking october 2019  ? ?Counseling given: Not Answered ?Tobacco comments: quit smoking october 2019 ? ? ?Outpatient Medications Prior to Visit  ?Medication Sig Dispense Refill  ? acetaminophen (TYLENOL) 500 MG tablet Take 1,000 mg by mouth every 6 (six) hours as needed for moderate pain or headache.    ? albuterol (VENTOLIN HFA) 108 (90 Base) MCG/ACT inhaler Inhale 2 puffs into the lungs every 6 (six) hours as needed for wheezing or shortness of breath. Okay to dispense proair/Ventolin/albuterol. 54 g 3  ? ALPRAZolam (XANAX) 0.5 MG tablet TAKE 1/2 TO 1 TABLET BY MOUTH  TWICE DAILY AS NEEDED FOR  ANXIETY 60 tablet 3  ? aspirin EC 81 MG tablet Take 1 tablet (81 mg total) by mouth daily. Swallow whole. 90 tablet 3  ? azithromycin (ZITHROMAX) 250 MG tablet Take 1 tablet (250 mg total) by mouth every Monday, Wednesday, and Friday. 30 tablet 3  ? Budeson-Glycopyrrol-Formoterol (BREZTRI AEROSPHERE) 160-9-4.8 MCG/ACT AERO Inhale 2 puffs into the lungs every 12 (twelve) hours. 32.1 g 3  ? buPROPion  (WELLBUTRIN XL) 300 MG 24 hr tablet TAKE 1 TABLET BY MOUTH  DAILY 90 tablet 3  ? Calcium 500 MG CHEW Chew 500 mg by mouth 2 (two) times daily.     ? Cholecalciferol (DIALYVITE VITAMIN D 5000) 125 MCG (5000 UT) capsule 1 tab a day except for 2 on Sundays    ? fluticasone (FLONASE) 50 MCG/ACT nasal spray Place 2 sprays into both nostrils daily as needed for allergies or rhinitis.    ? ibandronate (BONIVA) 150 MG tablet Take 1 tablet (150 mg total) by mouth every 30 (thirty) days. Take in the morning with a full glass of water, on an empty stomach, and do not take anything else by mouth or lie down for the next 30 min. 3 tablet 3  ? levothyroxine (SYNTHROID) 125 MCG tablet TAKE 1 TABLET BY MOUTH  DAILY EXCEPT TAKE 1 AND 1/2 TABLETS ON SUNDAY 98 tablet 3  ?  metoprolol succinate (TOPROL-XL) 25 MG 24 hr tablet TAKE 1 TABLET BY MOUTH  DAILY 90 tablet 3  ? Polyethyl Glycol-Propyl Glycol (SYSTANE OP) Place 1 drop into both eyes as needed.    ? rosuvastatin (CRESTOR) 5 MG tablet Take 1 tablet (5 mg total) by mouth daily. 90 tablet 3  ? Spacer/Aero-Holding Chambers (AEROCHAMBER MV) inhaler Use as instructed 1 each 0  ? tiZANidine (ZANAFLEX) 4 MG tablet TAKE 1/2 TO 1 TABLET BY  MOUTH EVERY 6 HOURS AS  NEEDED FOR MUSCLE SPASMS 90 tablet 0  ? UNABLE TO FIND Take 2 each by mouth at bedtime. Med Name: Hemp gummies    ? valsartan (DIOVAN) 40 MG tablet TAKE 1 TABLET BY MOUTH  DAILY 100 tablet 2  ? vitamin C (ASCORBIC ACID) 250 MG tablet Take 500 mg by mouth daily.    ? ?No facility-administered medications prior to visit.  ? ? ? ?Review of Systems:  ? ?Constitutional: No weight loss or gain, night sweats, fevers, chills, fatigue, or lassitude. ?HEENT: No headaches, difficulty swallowing, tooth/dental problems, or sore throat. No sneezing, itching, ear ache. + Nasal congestion (improving), postnasal drainage ?CV:  No chest pain, orthopnea, PND, swelling in lower extremities, anasarca, dizziness, palpitations, syncope ?Resp: +shortness  of breath with exertion; dry cough; wheezing (worse at night).  No excess mucus or change in color of mucus. No hemoptysis. No chest wall deformity ?GI:  No heartburn, indigestion, abdominal pain, nausea, vomi

## 2021-05-13 ENCOUNTER — Telehealth: Payer: Self-pay | Admitting: Nurse Practitioner

## 2021-05-14 NOTE — Telephone Encounter (Signed)
Called and spoke with patient. She verbalized understanding. She will contact her cardiologist.  ? ?Nothing further needed at time of call.  ?

## 2021-05-14 NOTE — Telephone Encounter (Signed)
If she is having new leg swelling, heavy chest, and worsening SOB, I would advise she seek further evaluation in the ED. These sound like new symptoms (aside from SOB) compared to her visit with me so I am concerned her symptoms could be related to a problem with her heart. Thanks.

## 2021-05-14 NOTE — Telephone Encounter (Signed)
Called and spoke with patient. I apologized for [place] not calling yesterday as the call was not marked urgent. She stated that she was feeling slightly better but she is still struggling with SOB. She also mentioned that her legs and ankles have been swollen. Her legs feel heavy. Her chest feels heavy like she has bricks on her chest at times. She wanted to know if it was possible for fluid to cause the SOB. I advised her that it is possible for her to be fluid overloaded, especially with her symptoms. She is not on any diuretics.  ? ?Joellen Jersey, can you please advise? Thanks!  ?

## 2021-05-15 ENCOUNTER — Encounter: Payer: Self-pay | Admitting: Physician Assistant

## 2021-05-15 ENCOUNTER — Ambulatory Visit: Payer: Medicare Other | Admitting: Physician Assistant

## 2021-05-15 VITALS — BP 121/86 | HR 83 | Ht 66.5 in | Wt 175.8 lb

## 2021-05-15 DIAGNOSIS — Z96612 Presence of left artificial shoulder joint: Secondary | ICD-10-CM

## 2021-05-15 DIAGNOSIS — Z8639 Personal history of other endocrine, nutritional and metabolic disease: Secondary | ICD-10-CM | POA: Diagnosis not present

## 2021-05-15 DIAGNOSIS — M316 Other giant cell arteritis: Secondary | ICD-10-CM

## 2021-05-15 DIAGNOSIS — M5136 Other intervertebral disc degeneration, lumbar region: Secondary | ICD-10-CM

## 2021-05-15 DIAGNOSIS — Z96643 Presence of artificial hip joint, bilateral: Secondary | ICD-10-CM

## 2021-05-15 DIAGNOSIS — M353 Polymyalgia rheumatica: Secondary | ICD-10-CM | POA: Diagnosis not present

## 2021-05-15 DIAGNOSIS — Z8781 Personal history of (healed) traumatic fracture: Secondary | ICD-10-CM | POA: Diagnosis not present

## 2021-05-15 DIAGNOSIS — R29898 Other symptoms and signs involving the musculoskeletal system: Secondary | ICD-10-CM

## 2021-05-15 DIAGNOSIS — M51369 Other intervertebral disc degeneration, lumbar region without mention of lumbar back pain or lower extremity pain: Secondary | ICD-10-CM

## 2021-05-15 DIAGNOSIS — Z8709 Personal history of other diseases of the respiratory system: Secondary | ICD-10-CM

## 2021-05-15 DIAGNOSIS — Z87891 Personal history of nicotine dependence: Secondary | ICD-10-CM

## 2021-05-15 DIAGNOSIS — I10 Essential (primary) hypertension: Secondary | ICD-10-CM | POA: Diagnosis not present

## 2021-05-15 DIAGNOSIS — F419 Anxiety disorder, unspecified: Secondary | ICD-10-CM

## 2021-05-15 DIAGNOSIS — F32A Depression, unspecified: Secondary | ICD-10-CM

## 2021-05-15 DIAGNOSIS — M810 Age-related osteoporosis without current pathological fracture: Secondary | ICD-10-CM | POA: Diagnosis not present

## 2021-05-18 ENCOUNTER — Encounter: Payer: Self-pay | Admitting: Certified Registered Nurse Anesthetist

## 2021-05-18 ENCOUNTER — Encounter: Payer: Self-pay | Admitting: Gastroenterology

## 2021-05-20 ENCOUNTER — Ambulatory Visit: Payer: Medicare Other | Admitting: Physician Assistant

## 2021-05-20 ENCOUNTER — Encounter: Payer: Self-pay | Admitting: Physician Assistant

## 2021-05-20 DIAGNOSIS — Z5181 Encounter for therapeutic drug level monitoring: Secondary | ICD-10-CM | POA: Diagnosis not present

## 2021-05-20 DIAGNOSIS — E7800 Pure hypercholesterolemia, unspecified: Secondary | ICD-10-CM

## 2021-05-20 DIAGNOSIS — I2510 Atherosclerotic heart disease of native coronary artery without angina pectoris: Secondary | ICD-10-CM | POA: Diagnosis not present

## 2021-05-20 DIAGNOSIS — I10 Essential (primary) hypertension: Secondary | ICD-10-CM

## 2021-05-20 DIAGNOSIS — R0609 Other forms of dyspnea: Secondary | ICD-10-CM

## 2021-05-20 MED ORDER — HYDROCHLOROTHIAZIDE 12.5 MG PO CAPS: 12.5000 mg | ORAL_CAPSULE | Freq: Every day | ORAL | 3 refills | Status: DC

## 2021-05-20 NOTE — Telephone Encounter (Signed)
Thank you for the note -I have only just now seen it since I was out of the office yesterday and just finished my morning clinic. ? ? ?Yes of course, her colonoscopy for this week (which is routine for history of polyps) must be canceled until her shortness of breath is better understood and improved with further testing and treatment. ? ?I reviewed the recent pulmonary clinic note as well as today's cardiology note, and it looks like she has an echocardiogram scheduled soon. ? ?Please ask her to contact us when her testing is complete and her shortness of breath is hopefully improved. ?Also, place a colonoscopy recall for August of this year so we do not lose track of her. ? ?HD ?

## 2021-05-20 NOTE — Telephone Encounter (Signed)
August colon recall in epic. ?

## 2021-05-20 NOTE — Patient Instructions (Signed)
Medication Instructions:  ?Start HCTZ 12.5 mg ( Take 1 Tablet Daily). ?*If you need a refill on your cardiac medications before your next appointment, please call your pharmacy* ? ? ?Lab Work: ?Lipid Panel, Hepatic Panel: Today. ?BMET: To Be Done in 2 Weeks. ?If you have labs (blood work) drawn today and your tests are completely normal, you will receive your results only by: ?MyChart Message (if you have MyChart) OR ?A paper copy in the mail ?If you have any lab test that is abnormal or we need to change your treatment, we will call you to review the results. ? ? ?Testing/Procedures: [address], [address]. ?Your physician has requested that you have an echocardiogram. Echocardiography is a painless test that uses sound waves to create images of your heart. It provides your doctor with information about the size and shape of your heart and how well your heart?s chambers and valves are working. This procedure takes approximately one hour. There are no restrictions for this procedure.  ? ? ?Follow-Up: ?At [facility], you and your health needs are our priority.  As part of our continuing mission to provide you with exceptional heart care, we have created designated Provider Care Teams.  These Care Teams include your primary Cardiologist (physician) and Advanced Practice Providers (APPs -  Physician Assistants and Nurse Practitioners) who all work together to provide you with the care you need, when you need it. ? ?We recommend signing up for the patient portal called "MyChart".  Sign up information is provided on this After Visit Summary.  MyChart is used to connect with patients for Virtual Visits (Telemedicine).  Patients are able to view lab/test results, encounter notes, upcoming appointments, etc.  Non-urgent messages can be sent to your provider as well.   ?To learn more about what you can do with MyChart, go to [email or web address].   ? ?Your next appointment:   ?3 month(s) ? ?The format for  your next appointment:   ?In Person ? ?Provider:   ?Buford Dresser, MD  ? ? ? ? ?Important Information About Sugar ? ? ? ? ?  ?
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
## 2021-05-21 LAB — HEPATIC FUNCTION PANEL
ALT: 16 IU/L (ref 0–32)
AST: 19 IU/L (ref 0–40)
Albumin: 4.3 g/dL (ref 3.8–4.8)
Alkaline Phosphatase: 92 IU/L (ref 44–121)
Bilirubin Total: 0.4 mg/dL (ref 0.0–1.2)
Bilirubin, Direct: 0.14 mg/dL (ref 0.00–0.40)
Total Protein: 6.8 g/dL (ref 6.0–8.5)

## 2021-05-21 LAB — LIPID PANEL
Chol/HDL Ratio: 2.5 ratio (ref 0.0–4.4)
Cholesterol, Total: 149 mg/dL (ref 100–199)
HDL: 60 mg/dL (ref >39)
LDL Chol Calc (NIH): 79 mg/dL (ref 0–99)
Triglycerides: 46 mg/dL (ref 0–149)
VLDL Cholesterol Cal: 10 mg/dL (ref 5–40)

## 2021-05-21 MED ORDER — HYDROCHLOROTHIAZIDE 12.5 MG PO CAPS
12.5000 mg | ORAL_CAPSULE | Freq: Every day | ORAL | 3 refills | Status: DC
Start: 2021-05-21 — End: 2021-07-28

## 2021-05-22 ENCOUNTER — Encounter: Payer: Medicare Other | Admitting: Gastroenterology

## 2021-05-23 ENCOUNTER — Ambulatory Visit: Payer: Medicare Other | Admitting: Adult Health

## 2021-05-23 DIAGNOSIS — R262 Difficulty in walking, not elsewhere classified: Secondary | ICD-10-CM | POA: Diagnosis not present

## 2021-05-23 DIAGNOSIS — M353 Polymyalgia rheumatica: Secondary | ICD-10-CM | POA: Diagnosis not present

## 2021-05-23 DIAGNOSIS — M6281 Muscle weakness (generalized): Secondary | ICD-10-CM | POA: Diagnosis not present

## 2021-05-26 DIAGNOSIS — R262 Difficulty in walking, not elsewhere classified: Secondary | ICD-10-CM | POA: Diagnosis not present

## 2021-05-26 DIAGNOSIS — M353 Polymyalgia rheumatica: Secondary | ICD-10-CM | POA: Diagnosis not present

## 2021-05-26 DIAGNOSIS — M6281 Muscle weakness (generalized): Secondary | ICD-10-CM | POA: Diagnosis not present

## 2021-05-30 ENCOUNTER — Other Ambulatory Visit (HOSPITAL_BASED_OUTPATIENT_CLINIC_OR_DEPARTMENT_OTHER): Payer: Self-pay

## 2021-05-30 DIAGNOSIS — E7800 Pure hypercholesterolemia, unspecified: Secondary | ICD-10-CM

## 2021-05-30 DIAGNOSIS — I2510 Atherosclerotic heart disease of native coronary artery without angina pectoris: Secondary | ICD-10-CM

## 2021-05-30 MED ORDER — ROSUVASTATIN CALCIUM 10 MG PO TABS: 10.0000 mg | ORAL_TABLET | Freq: Every day | ORAL | 3 refills | Status: DC

## 2021-06-04 LAB — HM PAP SMEAR: HM Pap smear: NORMAL

## 2021-06-05 ENCOUNTER — Telehealth: Payer: Self-pay

## 2021-06-05 NOTE — Telephone Encounter (Signed)
Pt returned [employer] phone call. I discussed lab results with pt. Pt states that she previously spoke with someone and increased her rosuvastatin to 10 mg. Pt is taking he increased dose and will complete labs when she comes to her next appointment as directed.

## 2021-06-10 ENCOUNTER — Other Ambulatory Visit: Payer: Self-pay | Admitting: Family Medicine

## 2021-06-10 ENCOUNTER — Ambulatory Visit (HOSPITAL_COMMUNITY): Payer: Medicare Other | Attending: Internal Medicine

## 2021-06-10 DIAGNOSIS — I2510 Atherosclerotic heart disease of native coronary artery without angina pectoris: Secondary | ICD-10-CM | POA: Diagnosis not present

## 2021-06-10 DIAGNOSIS — I10 Essential (primary) hypertension: Secondary | ICD-10-CM | POA: Diagnosis not present

## 2021-06-10 DIAGNOSIS — Z1231 Encounter for screening mammogram for malignant neoplasm of breast: Secondary | ICD-10-CM

## 2021-06-10 DIAGNOSIS — E7800 Pure hypercholesterolemia, unspecified: Secondary | ICD-10-CM | POA: Insufficient documentation

## 2021-06-10 DIAGNOSIS — R0609 Other forms of dyspnea: Secondary | ICD-10-CM

## 2021-06-10 LAB — ECHOCARDIOGRAM COMPLETE
Area-P 1/2: 3.05 cm2
S' Lateral: 3.5 cm

## 2021-06-12 ENCOUNTER — Telehealth: Payer: Self-pay

## 2021-06-12 NOTE — Telephone Encounter (Addendum)
Called patient regarding results. Patient had understanding of results.----- Message from Warren Lacy, PA-C sent at 06/11/2021 10:37 AM EDT ----- Good news -- left heart squeeze normal, normal right ventricle.  No valve disease.   No heart structure abnormality to explain shortness of breath.

## 2021-06-13 ENCOUNTER — Ambulatory Visit: Payer: Medicare Other | Admitting: Nurse Practitioner

## 2021-06-13 ENCOUNTER — Encounter: Payer: Self-pay | Admitting: Nurse Practitioner

## 2021-06-13 ENCOUNTER — Ambulatory Visit: Payer: Medicare Other | Admitting: Primary Care

## 2021-06-13 ENCOUNTER — Ambulatory Visit (INDEPENDENT_AMBULATORY_CARE_PROVIDER_SITE_OTHER): Payer: Medicare Other

## 2021-06-13 VITALS — BP 122/80 | HR 83 | Temp 98.1°F | Ht 66.0 in | Wt 169.6 lb

## 2021-06-13 DIAGNOSIS — J309 Allergic rhinitis, unspecified: Secondary | ICD-10-CM

## 2021-06-13 DIAGNOSIS — J449 Chronic obstructive pulmonary disease, unspecified: Secondary | ICD-10-CM

## 2021-06-13 DIAGNOSIS — R911 Solitary pulmonary nodule: Secondary | ICD-10-CM

## 2021-06-13 DIAGNOSIS — R0609 Other forms of dyspnea: Secondary | ICD-10-CM | POA: Diagnosis not present

## 2021-06-13 DIAGNOSIS — I5032 Chronic diastolic (congestive) heart failure: Secondary | ICD-10-CM | POA: Insufficient documentation

## 2021-06-13 NOTE — Assessment & Plan Note (Signed)
Resolved exacerbation.  She has severe COPD with FEV1 of 34 and high symptom burden.  She is on triple therapy as well as chronic azithromycin M/W/F.  Recommended that we refer her back to pulmonary rehab.  Walking oximetry today was performed with SPO2 low of 91% -recommended that we do formal 6-minute walk for further evaluation.  We will also obtain ONO to rule out nocturnal hypoxemia.   Patient Instructions  Continue Breztri 2 puffs Twice daily. Brush tongue and rinse mouth afterwards Continue Albuterol inhaler 2 puffs or 3 mL neb every 6 hours as needed for shortness of breath or wheezing. Notify if symptoms persist despite rescue inhaler/neb use. Continue azithromycin 250 mg Monday, Wednesday, and Friday Continue flonase 2 sprays each nostril daily  Continue allegra daily for allergies   Overnight oximetry - someone will contact you for scheduling  Six minute walk scheduled today   Restart pulmonary rehab - referral placed today    Follow up in three months with Dr. Valeta Harms or Joellen Jersey Akil Hoos,NP. If symptoms do not improve or worsen, please contact office for sooner follow up or seek emergency care.

## 2021-06-13 NOTE — Assessment & Plan Note (Signed)
Well-controlled on current regimen.  Continue Flonase for postnasal drainage control

## 2021-06-13 NOTE — Patient Instructions (Addendum)
Continue Breztri 2 puffs Twice daily. Brush tongue and rinse mouth afterwards Continue Albuterol inhaler 2 puffs or 3 mL neb every 6 hours as needed for shortness of breath or wheezing. Notify if symptoms persist despite rescue inhaler/neb use. Continue azithromycin 250 mg Monday, Wednesday, and Friday Continue flonase 2 sprays each nostril daily  Continue allegra daily for allergies   Overnight oximetry - someone will contact you for scheduling  Six minute walk scheduled today   Restart pulmonary rehab - referral placed today    Follow up in three months with Dr. Valeta Harms or Joellen Jersey Daden Mahany,NP. If symptoms do not improve or worsen, please contact office for sooner follow up or seek emergency care.

## 2021-06-13 NOTE — Progress Notes (Signed)
$'@Patient'G$  ID: April [name], female    DOB: 11/10/1955, 66 y.o.   MRN: [id number]  Chief Complaint  Patient presents with   Follow-up    She is about the same and still feels short of breath, she had a echo and was told it  was normal.     Referring provider: [name], MD  HPI: 66 year old female, former smoker (34-year history) followed for severe COPD and pulmonary nodules.  She is a patient Dr. Juline Patch and last seen in office on 05/07/2021 by [facility] NP.  She is also followed by the lung cancer screening program.  Past medical history significant for nonischemic cardiomyopathy, chronic systolic heart failure, atherosclerosis, allergic rhinitis, GERD, hypothyroidism, OA, DDD, CKD stage III, avascular necrosis of right hip 2014, anxiety/depression, vitamin D deficiency.  TEST/EVENTS:  12/07/2018 PFTs: FVC 56, FEV1 34, ratio 47, TLC 123, DLCOunc 49.  Severe obstructive airway disease with severe diffusion defect. 11/15/2019 echocardiogram: EF 55 to 60%.  Diastolic parameters were normal.  RV size and function was normal.  No valvular dysfunction was evident. 12/11/2020 CT chest lung cancer screening: No LAD is present.  Lung RADS 2, benign appearance with 2 solid nodules in the right upper lobe.  CAD and atherosclerosis present.  Moderate emphysema. 05/07/2021 CXR 2 view: Both lungs are clear.  There is no acute process noted.  12/20/2020: OV with Dr. Valeta Harms.  Had [place] in October and has had progressive DOE since.  For the past 2 weeks she has been without her azithromycin that she has been taking Monday Wednesday Friday; restarted at this visit.  Continued on Breztri.  Suspected that DOE was multifactorial given heart failure and other comorbidities.  Follow-up 6 months.  05/07/2021: OV with Jago Carton NP for concern for bronchitis.  She reports that over the last 3 days she has developed a primarily dry cough, worse when she lies down at night.  Symptoms initially started as what she thought were  allergies with increased nasal drainage and postnasal drip.  These have mostly improved ; still has some postnasal drainage and hoarseness.  Also feels like her dyspnea is worse than her baseline.  She has been using her albuterol multiple times throughout the day.  She denies any interim sick exposures, recent fevers, headaches, hemoptysis, lower extremity swelling.  She continues on Breztri twice daily and azithromycin Monday Wednesday Friday.  She has not required any prednisone therapy since November when she had COVID.  CXR without evidence of superimposed infection.  Treated with prednisone burst and cough control measures for acute bronchitis with COPD/laryngitis.  06/13/2021: Today-follow-up Patient presents today for follow-up.  After being seen last, she contacted the office and reported increased swelling in her legs with increased shortness of breath.  She was advised to seek emergency care but ended up scheduling a follow-up appointment with her cardiologist.  They obtained an echocardiogram which showed preserved EF and diastolic dysfunction.  They started her on diuretic regimen, which she reports today has resolved her leg swelling and feels like her breathing has improved.  She is still having DOE, especially with long distances, however this is pretty much back to her baseline.  Cough has entirely resolved.  She has not noticed any recent wheezing.  She denies any hemoptysis, recent weight loss, night sweats, anorexia, fevers.  She continues on Breztri twice daily and is on azithromycin M/W/F.  Uses albuterol couple times a week, sometimes daily.  She previously went to pulmonary rehab but was only  able to go twice and then she fell and broke her shoulder.  Feels like she has recovered well from this and is open to going back.  Allergies  Allergen Reactions   Sulfonamide Derivatives     As a child.  Throat closed, rash.   Alendronate Sodium     GI upset   Dilaudid [Hydromorphone Hcl]  Nausea And Vomiting   Other Other (See Comments)    Adhesive    Wound Dressing Adhesive Other (See Comments)    Redness, adhesive tapes. Needs PAPER TAPE.    Immunization History  Administered Date(s) Administered   Fluad Quad(high Dose 65+) 08/26/2018, 11/24/2019, 09/30/2020   Influenza Split 10/15/2010   Influenza,inj,Quad PF,6+ Mos 10/05/2012, 10/24/2013, 10/12/2014, 11/12/2015, 10/16/2016, 10/14/2017   Influenza-Unspecified 08/26/2018   PFIZER(Purple Top)SARS-COV-2 Vaccination 03/05/2019, 04/04/2019, 10/27/2019, 07/30/2020   Pneumococcal Conjugate-13 10/26/2018   Pneumococcal Polysaccharide-23 01/05/2009, 11/24/2019   Tdap 04/06/2011   Zoster Recombinat (Shingrix) 09/05/2020    Past Medical History:  Diagnosis Date   Allergy    Anemia    Anxiety    Anxiety and depression    related to caring for mother during terminal illness   Arthritis    Asthma    AVN (avascular necrosis of bone) (HCC)    hip and wrist   Bronchitis    hx of   Cataract    Chronic kidney disease    COPD (chronic obstructive pulmonary disease) ([place])    Depression    Emphysema of lung ([place])    Endometriosis    FH: CAD (coronary artery disease)    GERD (gastroesophageal reflux disease)    ocassional   History of blood transfusion    after hip repackment - broke out in hives and started itching   History of palpitations    Hyperlipidemia    Hypertension    Hypothyroid    Non-ischemic cardiomyopathy ([place]) 2019   Osteopenia    forteo through Dr. Estanislado Pandy (started 10/12)   Osteoporosis    PMR (polymyalgia rheumatica) (HCC)    SIRS (systemic inflammatory response syndrome) ([place]) 10/2017   Smoker    SOB (shortness of breath) on exertion    Squamous cell carcinoma    facial, 2016   Temporal arteritis ([place])    s/p prednisone taper    Tobacco History: Social History   Tobacco Use  Smoking Status Former   Packs/day: 0.33   Years: 34.00   Total pack years: 11.22   Types: Cigarettes    Quit date: 10/24/2017   Years since quitting: 3.6   Passive exposure: Past (dad smoked)  Smokeless Tobacco Never  Tobacco Comments   quit smoking october 2019   Counseling given: Not Answered Tobacco comments: quit smoking october 2019   Outpatient Medications Prior to Visit  Medication Sig Dispense Refill   acetaminophen (TYLENOL) 500 MG tablet Take 1,000 mg by mouth every 6 (six) hours as needed for moderate pain or headache.     albuterol (VENTOLIN HFA) 108 (90 Base) MCG/ACT inhaler Inhale 2 puffs into the lungs every 6 (six) hours as needed for wheezing or shortness of breath. Okay to dispense proair/Ventolin/albuterol. 54 g 3   ALPRAZolam (XANAX) 0.5 MG tablet TAKE 1/2 TO 1 TABLET BY MOUTH  TWICE DAILY AS NEEDED FOR  ANXIETY 60 tablet 3   amoxicillin-clavulanate (AUGMENTIN) 875-125 MG tablet Take 1 tablet by mouth 2 (two) times daily.     aspirin EC 81 MG tablet Take 1 tablet (81 mg total) by mouth daily. Swallow  whole. 90 tablet 3   azithromycin (ZITHROMAX) 250 MG tablet Take 1 tablet (250 mg total) by mouth every Monday, Wednesday, and Friday. 30 tablet 3   Budeson-Glycopyrrol-Formoterol (BREZTRI AEROSPHERE) 160-9-4.8 MCG/ACT AERO Inhale 2 puffs into the lungs every 12 (twelve) hours. 32.1 g 3   buPROPion (WELLBUTRIN XL) 300 MG 24 hr tablet TAKE 1 TABLET BY MOUTH  DAILY 90 tablet 3   Calcium 500 MG CHEW Chew 500 mg by mouth 2 (two) times daily.      Cholecalciferol (DIALYVITE VITAMIN D 5000) 125 MCG (5000 UT) capsule 1 tab a day except for 2 on Sundays     fluticasone (FLONASE) 50 MCG/ACT nasal spray Place 2 sprays into both nostrils daily as needed for allergies or rhinitis.     hydrochlorothiazide (MICROZIDE) 12.5 MG capsule Take 1 capsule (12.5 mg total) by mouth daily. 30 capsule 3   ibandronate (BONIVA) 150 MG tablet Take 1 tablet (150 mg total) by mouth every 30 (thirty) days. Take in the morning with a full glass of water, on an empty stomach, and do not take anything else by  mouth or lie down for the next 30 min. 3 tablet 3   levothyroxine (SYNTHROID) 125 MCG tablet TAKE 1 TABLET BY MOUTH  DAILY EXCEPT TAKE 1 AND 1/2 TABLETS ON SUNDAY 98 tablet 3   metoprolol succinate (TOPROL-XL) 25 MG 24 hr tablet TAKE 1 TABLET BY MOUTH  DAILY 90 tablet 3   Polyethyl Glycol-Propyl Glycol (SYSTANE OP) Place 1 drop into both eyes as needed.     rosuvastatin (CRESTOR) 10 MG tablet Take 1 tablet (10 mg total) by mouth daily. 90 tablet 3   Spacer/Aero-Holding Chambers (AEROCHAMBER MV) inhaler Use as instructed 1 each 0   tiZANidine (ZANAFLEX) 4 MG tablet TAKE 1/2 TO 1 TABLET BY  MOUTH EVERY 6 HOURS AS  NEEDED FOR MUSCLE SPASMS 90 tablet 0   valsartan (DIOVAN) 40 MG tablet TAKE 1 TABLET BY MOUTH  DAILY 100 tablet 2   vitamin C (ASCORBIC ACID) 250 MG tablet Take 500 mg by mouth daily.     UNABLE TO FIND Take 2 each by mouth at bedtime. Med Name: Hemp gummies (Patient not taking: Reported on 06/13/2021)     No facility-administered medications prior to visit.     Review of Systems:   Constitutional: No weight loss or gain, night sweats, fevers, chills, fatigue, or lassitude. HEENT: No headaches, difficulty swallowing, tooth/dental problems, or sore throat. No sneezing, itching, ear ache, nasal congestion, postnasal drainage CV:  No chest pain, orthopnea, PND, swelling in lower extremities, anasarca, dizziness, palpitations, syncope Resp: +shortness of breath with exertion (baseline).  No cough.  No wheezing.  No excess mucus or change in color of mucus. No hemoptysis. No chest wall deformity GI:  No heartburn, indigestion, abdominal pain, nausea, vomiting, diarrhea, change in bowel habits, loss of appetite, bloody stools.  GU: No dysuria, change in color of urine, urgency or frequency.  No flank pain, no hematuria  Skin: No rash, lesions, ulcerations MSK:  No joint pain or swelling.  No decreased range of motion.  No back pain. Neuro: No dizziness or lightheadedness.  Psych: No  depression or anxiety. Mood stable.     Physical Exam:  BP 122/80 (BP Location: Right Arm, Cuff Size: Normal)   Pulse 83   Temp 98.1 F (36.7 C) (Oral)   Ht '5\' 6"'$  (1.676 m)   Wt 169 lb 9.6 oz (76.9 kg)   SpO2 97%  BMI 27.37 kg/m   GEN: Pleasant, interactive, well-appearing; in no acute distress. HEENT:  Normocephalic and atraumatic. PERRLA. Sclera white. Nasal turbinates pink, moist and patent bilaterally.  No rhinorrhea present. Oropharynx pink and moist, without exudate or edema. No lesions, ulcerations NECK:  Supple w/ fair ROM. No JVD present. Normal carotid impulses w/o bruits. Thyroid symmetrical with no goiter or nodules palpated. No lymphadenopathy.   CV: RRR, no m/r/g, no peripheral edema. Pulses intact, +2 bilaterally. No cyanosis, pallor or clubbing. PULMONARY:  Unlabored, regular breathing.  Diminished bilaterally A&P w/o wheezes/rales/rhonchi. No accessory muscle use. No dullness to percussion. GI: BS present and normoactive. Soft, non-tender to palpation. No organomegaly or masses detected. No CVA tenderness. MSK: No erythema, warmth or tenderness. Cap refil <2 sec all extrem. No deformities or joint swelling noted.  Neuro: A/Ox3. No focal deficits noted.   Skin: Warm, no lesions or rashe Psych: Normal affect and behavior. Judgement and thought content appropriate.     Lab Results:  CBC    Component Value Date/Time   WBC 6.6 09/26/2020 1941   RBC 4.33 09/26/2020 1941   HGB 13.8 09/26/2020 1941   HCT 41.6 09/26/2020 1941   PLT 214 09/26/2020 1941   MCV 96.1 09/26/2020 1941   MCH 31.9 09/26/2020 1941   MCHC 33.2 09/26/2020 1941   RDW 13.2 09/26/2020 1941   LYMPHSABS 1.3 09/26/2020 1941   MONOABS 0.4 09/26/2020 1941   EOSABS 0.1 09/26/2020 1941   BASOSABS 0.0 09/26/2020 1941    BMET    Component Value Date/Time   NA 138 01/31/2021 1029   K 4.4 01/31/2021 1029   CL 101 01/31/2021 1029   CO2 22 01/31/2021 1029   GLUCOSE 99 01/31/2021 1029   GLUCOSE  125 (H) 09/26/2020 1941   BUN 15 01/31/2021 1029   CREATININE 1.09 (H) 01/31/2021 1029   CREATININE 1.13 (H) 06/03/2016 1213   CALCIUM 10.2 01/31/2021 1029   GFRNONAA 52 (L) 09/26/2020 1941   GFRNONAA 52 (L) 06/03/2016 1213   GFRAA 50 (L) 06/13/2019 0955   GFRAA 60 06/03/2016 1213    BNP No results found for: "BNP"   Imaging:  ECHOCARDIOGRAM COMPLETE  Result Date: 06/10/2021    ECHOCARDIOGRAM REPORT   Patient Name:   April [name] Date of Exam: 06/10/2021 Medical Rec #:  [id number]        Height:       66.1 in Accession #:    [id number]       Weight:       174.0 lb Date of Birth:  1955/06/30         BSA:          1.887 m Patient Age:    17 years         BP:           138/89 mmHg Patient Gender: F                HR:           77 bpm. Exam Location:  [address] Procedure: 2D Echo, Cardiac Doppler and Color Doppler Indications:    I25.10 CAD  History:        Patient has prior history of Echocardiogram examinations, most                 recent 11/15/2019. CAD, COPD, Signs/Symptoms:Shortness of                 Breath; Risk Factors:Hypertension, Dyslipidemia and Former  Smoker.  Sonographer:    Coralyn Helling RDCS Referring Phys: [id number] Warren Lacy  Sonographer Comments: Technically difficult study due to poor echo windows. Image acquisition challenging due to respiratory motion and Image acquisition challenging due to COPD. IMPRESSIONS  1. Left ventricular ejection fraction, by estimation, is 55%. The left ventricle has low normal function. The left ventricle has no regional wall motion abnormalities. Left ventricular diastolic parameters are consistent with Grade I diastolic dysfunction (impaired relaxation).  2. Right ventricular systolic function is normal. The right ventricular size is normal. Tricuspid regurgitation signal is inadequate for assessing PA pressure.  3. The mitral valve is normal in structure. No evidence of mitral valve regurgitation. No evidence of mitral  stenosis.  4. The aortic valve is tricuspid. Aortic valve regurgitation is not visualized.  5. The inferior vena cava is normal in size with greater than 50% respiratory variability, suggesting right atrial pressure of 3 mmHg. Comparison(s): No significant change from prior study. FINDINGS  Left Ventricle: Left ventricular ejection fraction, by estimation, is 55%. The left ventricle has low normal function. The left ventricle has no regional wall motion abnormalities. The left ventricular internal cavity size was normal in size. There is no left ventricular hypertrophy. Left ventricular diastolic parameters are consistent with Grade I diastolic dysfunction (impaired relaxation). Right Ventricle: The right ventricular size is normal. No increase in right ventricular wall thickness. Right ventricular systolic function is normal. Tricuspid regurgitation signal is inadequate for assessing PA pressure. Left Atrium: Left atrial size was normal in size. Right Atrium: Right atrial size was normal in size. Pericardium: There is no evidence of pericardial effusion. Mitral Valve: The mitral valve is normal in structure. No evidence of mitral valve regurgitation. No evidence of mitral valve stenosis. Tricuspid Valve: The tricuspid valve is normal in structure. Tricuspid valve regurgitation is trivial. No evidence of tricuspid stenosis. Aortic Valve: The aortic valve is tricuspid. Aortic valve regurgitation is not visualized. Pulmonic Valve: The pulmonic valve was normal in structure. Pulmonic valve regurgitation is not visualized. No evidence of pulmonic stenosis. Aorta: The aortic root and ascending aorta are structurally normal, with no evidence of dilitation. Pulmonary Artery: The pulmonary artery is of normal size. Venous: The inferior vena cava is normal in size with greater than 50% respiratory variability, suggesting right atrial pressure of 3 mmHg. IAS/Shunts: No atrial level shunt detected by color flow Doppler.  LEFT  VENTRICLE PLAX 2D LVIDd:         5.00 cm   Diastology LVIDs:         3.50 cm   LV e' medial:    8.27 cm/s LV PW:         1.10 cm   LV E/e' medial:  8.2 LV IVS:        0.90 cm   LV e' lateral:   14.50 cm/s LVOT diam:     2.00 cm   LV E/e' lateral: 4.7 LV SV:         54 LV SV Index:   28 LVOT Area:     3.14 cm  RIGHT VENTRICLE             IVC RV S prime:     10.20 cm/s  IVC diam: 1.30 cm TAPSE (M-mode): 2.2 cm LEFT ATRIUM             Index        RIGHT ATRIUM           Index LA diam:  2.40 cm 1.27 cm/m   RA Pressure: 3.00 mmHg LA Vol (A2C):   41.1 ml 21.79 ml/m  RA Area:     10.40 cm LA Vol (A4C):   20.9 ml 11.08 ml/m  RA Volume:   25.90 ml  13.73 ml/m LA Biplane Vol: 31.0 ml 16.43 ml/m  AORTIC VALVE LVOT Vmax:   86.20 cm/s LVOT Vmean:  58.700 cm/s LVOT VTI:    0.171 m  AORTA Ao Root diam: 3.00 cm Ao Asc diam:  3.20 cm MITRAL VALVE               TRICUSPID VALVE MV Area (PHT): 3.05 cm    Estimated RAP:  3.00 mmHg MV Decel Time: 249 msec MV E velocity: 67.60 cm/s  SHUNTS MV A velocity: 91.70 cm/s  Systemic VTI:  0.17 m MV E/A ratio:  0.74        Systemic Diam: 2.00 cm Rudean Haskell MD Electronically signed by Rudean Haskell MD Signature Date/Time: 06/10/2021/6:09:49 PM    Final          Latest Ref Rng & Units 12/07/2018    8:53 AM  PFT Results  FVC-Pre L 1.73   FVC-Predicted Pre % 49   FVC-Post L 1.99   FVC-Predicted Post % 56   Pre FEV1/FVC % % 46   Post FEV1/FCV % % 47   FEV1-Pre L 0.80   FEV1-Predicted Pre % 29   FEV1-Post L 0.93   DLCO uncorrected ml/min/mmHg 10.76   DLCO UNC% % 49   DLVA Predicted % 73   TLC L 6.80   TLC % Predicted % 123   RV % Predicted % 222     No results found for: "NITRICOXIDE"      Assessment & Plan:   COPD, severe (HCC) Resolved exacerbation.  She has severe COPD with FEV1 of 34 and high symptom burden.  She is on triple therapy as well as chronic azithromycin M/W/F.  Recommended that we refer her back to pulmonary rehab.  Walking  oximetry today was performed with SPO2 low of 91% -recommended that we do formal 6-minute walk for further evaluation.  We will also obtain ONO to rule out nocturnal hypoxemia.   Patient Instructions  Continue Breztri 2 puffs Twice daily. Brush tongue and rinse mouth afterwards Continue Albuterol inhaler 2 puffs or 3 mL neb every 6 hours as needed for shortness of breath or wheezing. Notify if symptoms persist despite rescue inhaler/neb use. Continue azithromycin 250 mg Monday, Wednesday, and Friday Continue flonase 2 sprays each nostril daily  Continue allegra daily for allergies   Overnight oximetry - someone will contact you for scheduling  Six minute walk scheduled today   Restart pulmonary rehab - referral placed today    Follow up in three months with Dr. Valeta Harms or Joellen Jersey Sianni Cloninger,NP. If symptoms do not improve or worsen, please contact office for sooner follow up or seek emergency care.   Pulmonary nodule Lung RADS 2 with 2 solid nodules, both stable and benign appearance on lung cancer screening CT from December 2022.  Plans to repeat December 2023 for 1 year follow-up.  Allergic rhinitis Well-controlled on current regimen.  Continue Flonase for postnasal drainage control  Chronic diastolic CHF (congestive heart failure) (HCC) Recent echo with normal EF and G1 DD.  Started on HCTZ and responded well.  Follow-up with cardiology as scheduled.    I spent 32 minutes of dedicated to the care of this patient on the date of this encounter  to include pre-visit review of records, face-to-face time with the patient discussing conditions above, post visit ordering of testing, clinical documentation with the electronic health record, making appropriate referrals as documented, and communicating necessary findings to members of the patients care team.  Clayton Bibles, NP 06/13/2021  Pt aware and understands NP's role.

## 2021-06-13 NOTE — Assessment & Plan Note (Signed)
Recent echo with normal EF and G1 DD.  Started on HCTZ and responded well.  Follow-up with cardiology as scheduled.

## 2021-06-13 NOTE — Assessment & Plan Note (Signed)
Lung RADS 2 with 2 solid nodules, both stable and benign appearance on lung cancer screening CT from December 2022.  Plans to repeat December 2023 for 1 year follow-up.

## 2021-06-17 ENCOUNTER — Telehealth (HOSPITAL_COMMUNITY): Payer: Self-pay

## 2021-06-17 ENCOUNTER — Ambulatory Visit
Admission: RE | Admit: 2021-06-17 | Discharge: 2021-06-17 | Disposition: A | Discharge status: Home / Self Care | Payer: Medicare Other | Source: Ambulatory Visit | Attending: Family Medicine | Admitting: Family Medicine

## 2021-06-17 ENCOUNTER — Encounter (HOSPITAL_COMMUNITY): Payer: Self-pay

## 2021-06-17 DIAGNOSIS — Z1231 Encounter for screening mammogram for malignant neoplasm of breast: Secondary | ICD-10-CM

## 2021-06-17 NOTE — Telephone Encounter (Signed)
Pt insurance is active and benefits verified through [facility] Medicare Co-pay $20, DED 0/0 met, out of pocket $3,600/$215 met, co-insurance 0%. no pre-authorization required. Passport, [place]/[employer] 06/17/2021@3 :13pm, REF# [id number]

## 2021-06-17 NOTE — Telephone Encounter (Signed)
Attempted to call patient in regards to Pulmonary Rehab - LM on VM Mailed letter 

## 2021-06-17 NOTE — Telephone Encounter (Signed)
Pt returned phone call and stated that she is interested in the Pulmonary rehab program. I advised pt that we will call her at a later time to get her scheduled

## 2021-07-07 ENCOUNTER — Telehealth: Payer: Self-pay

## 2021-07-07 NOTE — Progress Notes (Signed)
Chronic Care Management Pharmacy Assistant   Name: April [name]  MRN: [id number] DOB: June 30, 1955  Reason for Encounter: CCM (COPD Disease State)   Recent office visits:  None since last CCM contact  Recent consult visits:  06/13/21 Marland Kitchen, NP (Pulmonology) Referral to Pulmonary Rehabilitation Ordered: Overnight oximetry. FU 3 months 05/20/21 Caron Presume, PA (Cardiology) Start: MICROZIDE 12.5 MG capsule FU 3 months 05/15/21 Hazel Sams, PA (Rheumatology) Temporal Arteritis No medication changes FU 6 months 05/13/21 Marland Kitchen, NP (Pulmonology) Telephone: Pt reports SOB, legs/ankles swollen, heavy chest. Pt advised to be evaluated in ED. Patient did not go.  05/07/21 Marland Kitchen, NP (Pulmonology) Ordered: DG Chest and EKG. Start: Prednisone 40 mg. FU 1 month  Hospital visits:  None in previous 6 months  Medications: Outpatient Encounter Medications as of 07/07/2021  Medication Sig Note   acetaminophen (TYLENOL) 500 MG tablet Take 1,000 mg by mouth every 6 (six) hours as needed for moderate pain or headache.    albuterol (VENTOLIN HFA) 108 (90 Base) MCG/ACT inhaler Inhale 2 puffs into the lungs every 6 (six) hours as needed for wheezing or shortness of breath. Okay to dispense proair/Ventolin/albuterol.    ALPRAZolam (XANAX) 0.5 MG tablet TAKE 1/2 TO 1 TABLET BY MOUTH  TWICE DAILY AS NEEDED FOR  ANXIETY    amoxicillin-clavulanate (AUGMENTIN) 875-125 MG tablet Take 1 tablet by mouth 2 (two) times daily.    aspirin EC 81 MG tablet Take 1 tablet (81 mg total) by mouth daily. Swallow whole.    azithromycin (ZITHROMAX) 250 MG tablet Take 1 tablet (250 mg total) by mouth every Monday, Wednesday, and Friday.    Budeson-Glycopyrrol-Formoterol (BREZTRI AEROSPHERE) 160-9-4.8 MCG/ACT AERO Inhale 2 puffs into the lungs every 12 (twelve) hours.    buPROPion (WELLBUTRIN XL) 300 MG 24 hr tablet TAKE 1 TABLET BY MOUTH  DAILY    Calcium 500 MG CHEW Chew 500 mg by mouth 2 (two)  times daily.     Cholecalciferol (DIALYVITE VITAMIN D 5000) 125 MCG (5000 UT) capsule 1 tab a day except for 2 on Sundays 04/22/2021: Take 2 on Saturday and sunday   fluticasone (FLONASE) 50 MCG/ACT nasal spray Place 2 sprays into both nostrils daily as needed for allergies or rhinitis.    hydrochlorothiazide (MICROZIDE) 12.5 MG capsule Take 1 capsule (12.5 mg total) by mouth daily.    ibandronate (BONIVA) 150 MG tablet Take 1 tablet (150 mg total) by mouth every 30 (thirty) days. Take in the morning with a full glass of water, on an empty stomach, and do not take anything else by mouth or lie down for the next 30 min.    levothyroxine (SYNTHROID) 125 MCG tablet TAKE 1 TABLET BY MOUTH  DAILY EXCEPT TAKE 1 AND 1/2 TABLETS ON SUNDAY    metoprolol succinate (TOPROL-XL) 25 MG 24 hr tablet TAKE 1 TABLET BY MOUTH  DAILY    Polyethyl Glycol-Propyl Glycol (SYSTANE OP) Place 1 drop into both eyes as needed.    rosuvastatin (CRESTOR) 10 MG tablet Take 1 tablet (10 mg total) by mouth daily.    Spacer/Aero-Holding Chambers (AEROCHAMBER MV) inhaler Use as instructed    tiZANidine (ZANAFLEX) 4 MG tablet TAKE 1/2 TO 1 TABLET BY  MOUTH EVERY 6 HOURS AS  NEEDED FOR MUSCLE SPASMS    valsartan (DIOVAN) 40 MG tablet TAKE 1 TABLET BY MOUTH  DAILY    vitamin C (ASCORBIC ACID) 250 MG tablet Take 500 mg by mouth daily.    No facility-administered  encounter medications on file as of 07/07/2021.    Contacted patient to discuss COPD disease state  Current COPD regimen:  Breztri (PAP) - 2 puffs every 12 hours  Albuterol - 2 puffs every 6 hours as needed Azithromycin 250 mg - 1 tablet on Mon, Wed, and Friday  Patient stated she would like albuterol 0.083% nebulizer solution refilled so that she can use her nebulizer again. She feels she did better on her machine. Patient has only had one flare up in a year, but does get shortness of breath during the summer months with the heat and humidity.   Any recent hospitalizations  or ED visits since last visit with CPP? No  reports the following COPD symptoms, including increased shortness of breath   What recent interventions/DTPs have been made by any provider to improve breathing since last visit:  Referral to Pulmonary Rehabilitation  Ordered: Overnight oximetry   Have you had exacerbation/flare-up since last visit? No - one flare up in a year  What do you do when you are short of breath?  Rescue medication and Rest  Current tobacco use? Interested in cessation? No  Respiratory Devices/Equipment Do you have a nebulizer? Yes - does not have any medication for it Do you use a Peak Flow Meter? No Do you use a maintenance inhaler? Yes - Breztri How often do you forget to use your daily inhaler? Never Do you use a rescue inhaler? Yes Albuterol Inhaler How often do you use your rescue inhaler?  Every 3 - 4 hours (outside in the heat makes it worse) Do you use a spacer with your inhaler? Yes  Adherence Review: Does the patient have >5 day gap between last estimated fill date for maintenance inhaler medications? No  Cardiology appointment on 08/20/21 Pulmonology appointment on 09/15/21 CCM appointment on 10/22/21  Care Gaps: Annual wellness visit in last year? Yes 11/25/2020 Most Recent BP reading: 122/80 on 06/13/21  [facility] Medication Name  Last Fill Date  Day supply Rosuvastatin 10 mg  05/30/21  90     Valsartan 40 mg  05/27/21  90 Verified fill dates with [facility]   [name], CPP notified  Marijean Niemann, [place] [facility] Assistant [phone]

## 2021-07-09 ENCOUNTER — Telehealth: Payer: Self-pay | Admitting: Family Medicine

## 2021-07-09 NOTE — Telephone Encounter (Signed)
Patient called and said that she needs a DNR form filled out and wanted to know if we can mail it to her. She also said [place] and they also want her to have a MOST form filled out and she wasn't sure what that is. Call back is [phone]. She also mentioned about getting a walker and didn't know how to go about getting one, Please advise

## 2021-07-09 NOTE — Telephone Encounter (Signed)
Patient will need an appt to discuss all of this. Called patient but no answer and left a message to call back.

## 2021-07-11 NOTE — Telephone Encounter (Signed)
Called patient and scheduled appt for 07/17/21 at 2:00 pm

## 2021-07-17 ENCOUNTER — Telehealth (INDEPENDENT_AMBULATORY_CARE_PROVIDER_SITE_OTHER): Payer: Medicare Other | Admitting: Family Medicine

## 2021-07-17 ENCOUNTER — Encounter: Payer: Self-pay | Admitting: Family Medicine

## 2021-07-17 DIAGNOSIS — R269 Unspecified abnormalities of gait and mobility: Secondary | ICD-10-CM | POA: Diagnosis not present

## 2021-07-17 DIAGNOSIS — Z7189 Other specified counseling: Secondary | ICD-10-CM

## 2021-07-17 NOTE — Progress Notes (Signed)
Virtual visit completed through WebEx or similar program Patient location: home  Provider location: [facility] at [facility], office  Participants: Patient and me (unless stated otherwise below)  Pandemic considerations d/w pt.   Limitations and rationale for visit method d/w patient.  Patient agreed to proceed.   CC: follow up.    HPI:  she changed to a virtual visit given possible exposure to covid- there are cases in her facility.  She doesn't have sx.  D/w pt.  Routine cautions d/w pt.  She can test if she has sx. she can update [place] as needed.  She isn't a DNR.  D/w pt about it.  "If there is a chance then do what you can" but she doesn't want a prolonged stay on a ventilator.  She would want full care unless the situation was futile and would then want comfort care.    We filled out MOST form to reflect her wishes.  Routine guidance regarding MOST form discussed with patient.  We will scan a copy and then mail her the original.  She needs a surgery hx and med list sent at the same time.  We will work on that.  D/w pt about walker use.  Fall cautions d/w pt.  She has balance changes at baseline.  Needs rollator order.  We will send that to patient.  Meds and allergies reviewed.   ROS: Per HPI unless specifically indicated in ROS section   NAD Speech wnl  A/P:  Advance care planning.  She isn't a DNR.  D/w pt about it.  "If there is a chance then do what you can" but she doesn't want a prolonged stay on a ventilator.  She would want full care unless the situation was futile and would then want comfort care.    We filled out MOST form to reflect her wishes.  Routine guidance regarding MOST form discussed with patient.  We will scan a copy and then mail her the original.  She needs a surgery hx and med list sent at the same time.  We will work on that.  Fall risk/gait changes.  D/w pt about walker use.  Fall cautions d/w pt.  She has balance changes at baseline.  Needs rollator  order.  We will send that to patient.  30 minutes were devoted to patient care in this encounter (this includes time spent reviewing the patient's file/history, interviewing and examining the patient, counseling/reviewing plan with patient).

## 2021-07-18 ENCOUNTER — Other Ambulatory Visit: Payer: Self-pay | Admitting: Family Medicine

## 2021-07-20 ENCOUNTER — Telehealth: Payer: Self-pay | Admitting: Family Medicine

## 2021-07-20 DIAGNOSIS — R269 Unspecified abnormalities of gait and mobility: Secondary | ICD-10-CM | POA: Insufficient documentation

## 2021-07-20 DIAGNOSIS — R2681 Unsteadiness on feet: Secondary | ICD-10-CM | POA: Insufficient documentation

## 2021-07-20 NOTE — Assessment & Plan Note (Signed)
Advance care planning.  She isn't a DNR.  D/w pt about it.  "If there is a chance then do what you can" but she doesn't want a prolonged stay on a ventilator.  She would want full care unless the situation was futile and would then want comfort care.    We filled out MOST form to reflect her wishes.  Routine guidance regarding MOST form discussed with patient.  We will scan a copy and then mail her the original.

## 2021-07-20 NOTE — Assessment & Plan Note (Signed)
Fall risk/gait changes.  D/w pt about walker use.  Fall cautions d/w pt.  She has balance changes at baseline.  Needs rollator order.  We will send that to patient.

## 2021-07-20 NOTE — Telephone Encounter (Signed)
Please scan a copy of her MOST form. Please send the original pink copy to patient along with a copy of her surgical history and medication list. Please send copy for rollator at the same time.  I typed the letter for rollator.  Please make sure it printed so I can sign it and then send with the above.  Thanks.

## 2021-07-21 NOTE — Telephone Encounter (Signed)
LMTCB to verify address all this needs to go to

## 2021-07-21 NOTE — Telephone Encounter (Signed)
I have placed all forms in the mail for patient.

## 2021-07-21 NOTE — Telephone Encounter (Signed)
Patient stated she would like albuterol 0.083% nebulizer solution refilled so that she can use her nebulizer again. She feels she did better on her machine. Patient has only had one flare up in a year, but does get shortness of breath during the summer months with the heat and humidity.   Pulmonology handles inhaler/nebulizer refills. Routing to refill pool.

## 2021-07-21 NOTE — Telephone Encounter (Signed)
Pt call back to verify address it's current on chart

## 2021-07-22 NOTE — Telephone Encounter (Signed)
Called patient to confirm the pharmacy but she did not answer. Left message for her to call us back.

## 2021-07-28 ENCOUNTER — Telehealth (HOSPITAL_COMMUNITY): Payer: Self-pay

## 2021-07-28 ENCOUNTER — Encounter: Payer: Self-pay | Admitting: Pulmonary Disease

## 2021-07-28 ENCOUNTER — Telehealth: Payer: Self-pay | Admitting: Pharmacist

## 2021-07-28 ENCOUNTER — Other Ambulatory Visit: Payer: Self-pay | Admitting: Physician Assistant

## 2021-07-28 MED ORDER — BREZTRI AEROSPHERE 160-9-4.8 MCG/ACT IN AERO: 2.0000 | INHALATION_SPRAY | Freq: Two times a day (BID) | RESPIRATORY_TRACT | 0 refills | Status: DC

## 2021-07-28 NOTE — Telephone Encounter (Signed)
Called and spoke with patient. Patient stated that pharmacy never got her prescription for brezti. Advised patient I would resend the prescription in. Patient also asked if we had samples. I left 2 samples up front for patient and she will be by to pick them up today 07/28/2021. Prescription has been sent to the pharmacy.   Nothing further needed.

## 2021-07-28 NOTE — Telephone Encounter (Signed)
Pt left a message on department phone stating that she wanted to be rescheduled for her PR orientation. She stated that she did not  feel well. Her paperwork will returned to the schedulers.

## 2021-07-28 NOTE — Addendum Note (Signed)
Addended by: Fran Lowes on: 07/28/2021 11:59 AM   Modules accepted: Orders

## 2021-07-31 ENCOUNTER — Telehealth (HOSPITAL_COMMUNITY): Payer: Self-pay

## 2021-07-31 MED ORDER — BREZTRI AEROSPHERE 160-9-4.8 MCG/ACT IN AERO: 2.0000 | INHALATION_SPRAY | Freq: Two times a day (BID) | RESPIRATORY_TRACT | 3 refills | Status: DC

## 2021-07-31 NOTE — Telephone Encounter (Signed)
Returned call from pt. She is still not feeling well and cannot make her PR orientation appt tomorrow. She understands that her paperwork will be sent back to the schedulers. Appt have been canceled.

## 2021-08-01 ENCOUNTER — Ambulatory Visit (HOSPITAL_COMMUNITY): Payer: Medicare Other

## 2021-08-07 ENCOUNTER — Ambulatory Visit (HOSPITAL_COMMUNITY): Payer: Medicare Other

## 2021-08-12 ENCOUNTER — Ambulatory Visit (HOSPITAL_COMMUNITY): Payer: Medicare Other

## 2021-08-14 ENCOUNTER — Ambulatory Visit (HOSPITAL_COMMUNITY): Payer: Medicare Other

## 2021-08-19 ENCOUNTER — Telehealth (HOSPITAL_BASED_OUTPATIENT_CLINIC_OR_DEPARTMENT_OTHER): Payer: Self-pay | Admitting: Cardiology

## 2021-08-19 ENCOUNTER — Ambulatory Visit (HOSPITAL_COMMUNITY): Payer: Medicare Other

## 2021-08-19 NOTE — Telephone Encounter (Signed)
Patient arrived a day early for her appointment with Dr. Harrell Gave. Patient stated that her blood pressure is low and she is dizzy.

## 2021-08-19 NOTE — Telephone Encounter (Signed)
Pt has an appt scheduled for 8/16//2023.  Attempted to call patient to discuss her symptoms.  No answer received. Georgana Curio MHA RN CCM

## 2021-08-20 ENCOUNTER — Encounter (HOSPITAL_BASED_OUTPATIENT_CLINIC_OR_DEPARTMENT_OTHER): Payer: Self-pay | Admitting: Cardiology

## 2021-08-20 ENCOUNTER — Ambulatory Visit (HOSPITAL_BASED_OUTPATIENT_CLINIC_OR_DEPARTMENT_OTHER): Payer: Medicare Other | Admitting: Cardiology

## 2021-08-20 VITALS — BP 137/87 | HR 76 | Ht 66.0 in | Wt 170.9 lb

## 2021-08-20 DIAGNOSIS — I428 Other cardiomyopathies: Secondary | ICD-10-CM | POA: Diagnosis not present

## 2021-08-20 DIAGNOSIS — Z7189 Other specified counseling: Secondary | ICD-10-CM

## 2021-08-20 DIAGNOSIS — Z8249 Family history of ischemic heart disease and other diseases of the circulatory system: Secondary | ICD-10-CM | POA: Diagnosis not present

## 2021-08-20 DIAGNOSIS — E7800 Pure hypercholesterolemia, unspecified: Secondary | ICD-10-CM | POA: Diagnosis not present

## 2021-08-20 DIAGNOSIS — I2510 Atherosclerotic heart disease of native coronary artery without angina pectoris: Secondary | ICD-10-CM | POA: Diagnosis not present

## 2021-08-20 DIAGNOSIS — I700 Atherosclerosis of aorta: Secondary | ICD-10-CM | POA: Diagnosis not present

## 2021-08-20 NOTE — Progress Notes (Signed)
Cardiology Office Note:    Date:  08/20/2021   ID:  April [name], DOB 1955-05-27, MRN [id number]  PCP:  [name], MD  Cardiologist:  Buford Dresser, MD   Referring MD: [name], MD   CC: follow up  History of Present Illness:    April [name] is a 66 y.o. female with a hx of COPD gold stage 3, pulmonary nodules, PMR, temporal arteritis who is seen for follow up today. I initially met her 01/2019 as a new consult at the request of [name], MD for the evaluation and management of abnormal echo.  Cardiovascular risk factors: Chronic inflammatory conditions: PMR/temporal arteritis Tobacco use history: former Family history: mother had bypass surgery at age 43 (MI prior), kidney disease, colon cancer. Deceased aged 52. Father died of an accident at age 31, had COPD prior. MGF with stroke, died age 82. No other CVD that she knows of, possibly one other aunt/uncle with a stroke. Prior cardiac testing and/or incidental findings on other testing (ie coronary calcium): aortic calcifications seen on noncontrast ct. Cardiac cath 2006 by Dr. Gwenlyn Found, no CAD. Stress test 09/08/2017 without ischemia. Echo 11/20 with reduced EF.  As of 11/2020 labs her LDL was 128, HDL was 60. There were no changes made to medication. Diet recommendations were made. She was seen by Warren Lacy, PA-C on 05/20/21 for dyspnea on exertion, wheezing and cough. She was treated for COPD flare with prednisone. Ankle edema, wheezing and cough had improved although she still had concerns of dyspnea with mild exertion. She was started on HCTZ 12.5 mg daily and continued on aspirin 81 mg daily and Diovan 40 mg daily.   Today, she checked her blood pressure this morning at home and it was 90/54 although her blood pressure in clinic today is 137/87. She has not taken the valsartan this morning. Her blood pressure has been dropping for 2 days. Yes[facility], [facility]  [facility] and recorded 100/60 at home. When she checked her bp herself, she recorded 90/60. She also had a headache and neck pain. Today, she feels dizzy but denies passing out or any pain. Her BP has been going up and down since her last medication was prescribed. She has not had any previous issues with her blood pressure up until she started the HCTZ. She has reported fluctuating blood pressures for 2-3 months but she was asymptomatic.  Additionally she is concerned about having varicose veins in her legs causing a stinging sensation. This feeling is occasional and doesn't last long. She notices it most when she is standing, such as when washing dishes at the sink; although it may also occur while sitting. She is not sure what could be causing this.   She reports that her ankle swelling has improved with medication. When it does occur, it will resolve by the next morning.  Also she notes a strange sensation in her eyes as if they are "pulsing" or "dilated".  She has overall been eating well. She typically eats all 3 meals a day. She had about 10 bottles of water to drink yesterday.  She denies any palpitations, chest pain, shortness of breath. No syncope, orthopnea, or PND.   Past Medical History:  Diagnosis Date   Allergy    Anemia    Anxiety    Anxiety and depression    related to caring for mother during terminal illness   Arthritis  Asthma    AVN (avascular necrosis of bone) (HCC)    hip and wrist   Bronchitis    hx of   Cataract    Chronic kidney disease    COPD (chronic obstructive pulmonary disease) (HCC)    Depression    Emphysema of lung ([place])    Endometriosis    FH: CAD (coronary artery disease)    GERD (gastroesophageal reflux disease)    ocassional   History of blood transfusion    after hip repackment - broke out in hives and started itching   History of palpitations    Hyperlipidemia    Hypertension    Hypothyroid    Non-ischemic cardiomyopathy ([place]) 2019    Osteopenia    forteo through Dr. Estanislado Pandy (started 10/12)   Osteoporosis    PMR (polymyalgia rheumatica) (HCC)    SIRS (systemic inflammatory response syndrome) ([place]) 10/2017   Smoker    SOB (shortness of breath) on exertion    Squamous cell carcinoma    facial, 2016   Temporal arteritis (HCC)    s/p prednisone taper    Past Surgical History:  Procedure Laterality Date   ABDOMINAL ADHESION SURGERY     ABDOMINAL HYSTERECTOMY     BREAST EXCISIONAL BIOPSY Left    BREAST REDUCTION SURGERY Bilateral 06/13/2019   Procedure: MAMMARY REDUCTION  (BREAST);  Surgeon: Cindra Presume, MD;  Location: [place];  Service: Plastics;  Laterality: Bilateral;   BREAST SURGERY Left    benign bx 1990   CARDIAC CATHETERIZATION  2007   no PCI   CATARACT EXTRACTION Bilateral    CHOLECYSTECTOMY     COLONOSCOPY     COLONOSCOPY W/ POLYPECTOMY     INCONTINENCE SURGERY     2007    JOINT REPLACEMENT Left 2012   left hip   REDUCTION MAMMAPLASTY     2021   REVERSE SHOULDER ARTHROPLASTY Left 05/28/2020   Procedure: REVERSE SHOULDER ARTHROPLASTY;  Surgeon: Nicholes Stairs, MD;  Location: [place];  Service: Orthopedics;  Laterality: Left;  2.5 hrs   TONSILLECTOMY     TOTAL HIP ARTHROPLASTY Right 10/04/2012   Procedure: TOTAL HIP ARTHROPLASTY;  Surgeon: Garald Balding, MD;  Location: [place];  Service: Orthopedics;  Laterality: Right;   TOTAL SHOULDER REPLACEMENT Left    WRIST SURGERY Left    2012/ left wrist/ bone removed due to necrosis    Current Medications: Current Outpatient Medications on File Prior to Visit  Medication Sig   acetaminophen (TYLENOL) 500 MG tablet Take 1,000 mg by mouth every 6 (six) hours as needed for moderate pain or headache.   albuterol (VENTOLIN HFA) 108 (90 Base) MCG/ACT inhaler Inhale 2 puffs into the lungs every 6 (six) hours as needed for wheezing or shortness of breath. Okay to dispense proair/Ventolin/albuterol.   ALPRAZolam (XANAX) 0.5 MG tablet TAKE 1/2 TO 1  TABLET BY MOUTH  TWICE DAILY AS NEEDED FOR  ANXIETY   Budeson-Glycopyrrol-Formoterol (BREZTRI AEROSPHERE) 160-9-4.8 MCG/ACT AERO Inhale 2 puffs into the lungs in the morning and at bedtime.   Budeson-Glycopyrrol-Formoterol (BREZTRI AEROSPHERE) 160-9-4.8 MCG/ACT AERO Inhale 2 puffs into the lungs every 12 (twelve) hours.   buPROPion (WELLBUTRIN XL) 300 MG 24 hr tablet TAKE 1 TABLET BY MOUTH  DAILY   Calcium 500 MG CHEW Chew 500 mg by mouth 2 (two) times daily.    Cholecalciferol (DIALYVITE VITAMIN D 5000) 125 MCG (5000 UT) capsule 1 tab a day except for 2 on Sundays   hydrochlorothiazide (MICROZIDE) 12.5  MG capsule TAKE 1 CAPSULE BY MOUTH DAILY   ibandronate (BONIVA) 150 MG tablet Take 1 tablet (150 mg total) by mouth every 30 (thirty) days. Take in the morning with a full glass of water, on an empty stomach, and do not take anything else by mouth or lie down for the next 30 min.   levothyroxine (SYNTHROID) 125 MCG tablet TAKE 1 TABLET BY MOUTH  DAILY EXCEPT TAKE 1 AND 1/2 TABLETS ON SUNDAY   metoprolol succinate (TOPROL-XL) 25 MG 24 hr tablet TAKE 1 TABLET BY MOUTH  DAILY   Polyethyl Glycol-Propyl Glycol (SYSTANE OP) Place 1 drop into both eyes as needed.   rosuvastatin (CRESTOR) 10 MG tablet Take 1 tablet (10 mg total) by mouth daily.   Spacer/Aero-Holding Chambers (AEROCHAMBER MV) inhaler Use as instructed   tiZANidine (ZANAFLEX) 4 MG tablet TAKE 1/2 TO 1 TABLET BY  MOUTH EVERY 6 HOURS AS  NEEDED FOR MUSCLE SPASMS   valsartan (DIOVAN) 40 MG tablet TAKE 1 TABLET BY MOUTH  DAILY   vitamin C (ASCORBIC ACID) 250 MG tablet Take 500 mg by mouth daily.   No current facility-administered medications on file prior to visit.     Allergies:   Sulfonamide derivatives, Alendronate sodium, Dilaudid [hydromorphone hcl], Other, and Wound dressing adhesive   Social History   Tobacco Use   Smoking status: Former    Packs/day: 0.33    Years: 34.00    Total pack years: 11.22    Types: Cigarettes    Quit  date: 10/24/2017    Years since quitting: 3.8    Passive exposure: Past (dad smoked)   Smokeless tobacco: Never   Tobacco comments:    quit smoking october 2019  Vaping Use   Vaping Use: Former   Devices: tried - quited prior to 2019  Substance Use Topics   Alcohol use: Yes    Alcohol/week: 0.0 standard drinks of alcohol    Comment: occasional- rarely   Drug use: Never    Family History: family history includes Alzheimer's disease in her mother; Arthritis in her brother; Breast cancer in her maternal aunt; Colon cancer in her mother; Colon polyps in her brother; Healthy in her son; Heart disease in her mother; Hypertension in her mother; Kidney failure in her mother; Suicidality in her brother. There is no history of Esophageal cancer, Stomach cancer, Rectal cancer, or Crohn's disease.  ROS:   Please see the history of present illness.   (+) Stinging pain in legs (+) Dizziness Additional pertinent ROS otherwise unremarkable.  EKGs/Labs/Other Studies Reviewed:    The following studies were reviewed today:  Echo 06/10/21: IMPRESSIONS     1. Left ventricular ejection fraction, by estimation, is 55%. The left  ventricle has low normal function. The left ventricle has no regional wall  motion abnormalities. Left ventricular diastolic parameters are consistent  with Grade I diastolic  dysfunction (impaired relaxation).   2. Right ventricular systolic function is normal. The right ventricular  size is normal. Tricuspid regurgitation signal is inadequate for assessing  PA pressure.   3. The mitral valve is normal in structure. No evidence of mitral valve  regurgitation. No evidence of mitral stenosis.   4. The aortic valve is tricuspid. Aortic valve regurgitation is not  visualized.   5. The inferior vena cava is normal in size with greater than 50%  respiratory variability, suggesting right atrial pressure of 3 mmHg.   Comparison(s): No significant change from prior study.    Cardiac/Coronary CTA 02/12/2021: Coronary  Arteries:  Normal coronary origin.  RLeft dominance.   RCA is small, nondominant. Calcified plaque in ostial RCA causes 0-24% stenosis   Left main is a large artery that gives rise to LAD and LCX arteries. Calcified plaque in the left main causes 0-24% stenosis   LAD is a large vessel that has no plaque.   LCX is a dominant artery.  There is no plaque.   There is a ramus that has no plaque   Other findings:   Left Ventricle: Mild dilatation   Left Atrium: Normal size. PFO   Pulmonary Veins: Normal configuration   Right Ventricle: Mild dilatation   Right Atrium: Normal size   Cardiac valves: No calcifications   Thoracic aorta: Normal size   Pulmonary Arteries: Normal size   Systemic Veins: Normal drainage   Pericardium: Normal thickness   IMPRESSION: 1. Coronary calcium score of 71. This was 74th percentile for age and sex matched control.   2.  Normal coronary origin with left dominance.   3. Nonobstructive CAD with calcified plaque in left main and ostial RCA causing minimal (0-24%) stenosis   CAD-RADS 1. Minimal non-obstructive CAD (0-24%). Consider non-atherosclerotic causes of chest pain. Consider preventive therapy and risk factor modification.  CT Chest 12/11/2020: COMPARISON:  CT chest 11/13/2019 CT AP and CT chest 07/04/2018   FINDINGS: Cardiovascular: Heart size is normal. Aortic atherosclerosis identified. Coronary artery atherosclerotic calcification.   Mediastinum/Nodes: No enlarged mediastinal, hilar, or axillary lymph nodes. Thyroid gland, trachea, and esophagus demonstrate no significant findings.   Lungs/Pleura: No pleural effusion, interstitial edema, or airspace consolidation. Scattered areas of scarring identified bilaterally. Moderate changes of centrilobular and paraseptal emphysema. There are 2 solid nodules within the right upper lobe which appear stable compared with CT from 07/04/2018.  The largest nodule has a mean derived diameter of 6.4 mm.   Upper Abdomen: No acute abnormality.  Status post cholecystectomy.   Musculoskeletal: Spondylosis identified within the thoracic spine. No acute or suspicious osseous findings.   IMPRESSION: 1. Lung-RADS 2, benign appearance or behavior. Continue annual screening with low-dose chest CT without contrast in 12 months. 2. Coronary artery calcifications. 3. Aortic Atherosclerosis (ICD10-I70.0).  Echo 11/15/2019: Sonographer Comments: Technically difficult study due to poor echo  windows. Breast reduction.  IMPRESSIONS    1. Left ventricular ejection fraction, by estimation, is 55 to 60%. The  left ventricle has normal function. The left ventricle has no regional  wall motion abnormalities. The left ventricular internal cavity size was  mildly dilated. Left ventricular  diastolic parameters were normal.   2. Right ventricular systolic function is normal. The right ventricular  size is normal.   3. The mitral valve is normal in structure. No evidence of mitral valve  regurgitation. No evidence of mitral stenosis.   4. The aortic valve is normal in structure. Aortic valve regurgitation is  not visualized. No aortic stenosis is present.   5. The inferior vena cava is normal in size with greater than 50%  respiratory variability, suggesting right atrial pressure of 3 mmHg.   Nuclear Stress Test 09/07/2017: Pharmacological myocardial perfusion imaging study with no significant  ischemia Normal wall motion, EF estimated at 58% No EKG changes concerning for ischemia at peak stress or in recovery. Low risk scan  Echo 09/01/2017 - Left ventricle: The cavity size was normal. Systolic function was   mildly to moderately reduced. The estimated ejection fraction was   in the range of 40% to 45%. Diffuse hypokinesis. Doppler  parameters are consistent with abnormal left ventricular   relaxation (grade 1 diastolic dysfunction). - Left  atrium: The atrium was normal in size. - Right ventricle: Systolic function was normal. - Pulmonary arteries: Systolic pressure was within the normal   range.  Cardiac cath 2006:  DESCRIPTION OF PROCEDURE:  The patient was brought to the second floor [facility] in a postabsorptive state.  She was  premedicated with p.o. Valium.  Her right groin was prepped and shaved in  the usual sterile fashion.  1% Xylocaine was used for local anesthesia.  A 6  French sheath was inserted into the right femoral artery using standard  Seldinger technique.  A 6 French right and left Judkins diagnostic catheters  as well as 6 French pigtail catheter, were used for selective coronary  angiography, left ventriculography, and RCA angiography.  Visipaque dye was  used for the entirety of the case.  Retrograde aortic and left ventricular  pull back pressures were recorded.    HEMODYNAMIC DATA:  1.  Aortic systolic pressure 696, diastolic pressure 91.  2.  Left ventricular systolic pressure 789, end diastolic pressure 11.    SELECTIVE CORONARY ANGIOGRAPHY:  1.  Left main normal.  2.  LAD normal.  3.  The left circumflex was dominant normal.  4.  The right coronary artery was nondominant and was normal.    LEFT VENTRICULOGRAPHY:  RAO left ventriculogram was performed using 20 mL of  Visipaque dye at 10 mL per second.  The overall LVEF is greater than 50%  without wall motion abnormalities.    RCA ANGIOGRAPHY:  Performed in the LAO view using 20 mL of Visipaque dye at  20 mL per second, arch vessels were intact, there was no evidence of aortic  dissection.    IMPRESSION:  Ms. Lashley has essentially normal coronary arteries, normal LV  function, no evidence of dissection.  I believe her chest pain is  noncardiac, most likely reflux related.  The sheath was removed and pressure  was held to achieve hemostasis.  The patient left the lab in stable  condition.  She will be  observed overnight and discharged home in the  morning.  She will follow up with her primary care MD.  EKG:  EKG is personally reviewed.   08/20/21: EKG was not ordered. 02/28/2021: EKG was not ordered. 01/31/21: NSR at 80 bpm, nonspecific t wave pattern  Recent Labs: 09/26/2020: Hemoglobin 13.8; Platelets 214 11/21/2020: TSH 1.53 01/31/2021: BUN 15; Creatinine, Ser 1.09; Potassium 4.4; Sodium 138 05/20/2021: ALT 16   Recent Lipid Panel    Component Value Date/Time   CHOL 149 05/20/2021 1154   TRIG 46 05/20/2021 1154   HDL 60 05/20/2021 1154   CHOLHDL 2.5 05/20/2021 1154   CHOLHDL 3 11/21/2020 0939   VLDL 13.0 11/21/2020 0939   LDLCALC 79 05/20/2021 1154    Physical Exam:    VS:  BP 137/87 (BP Location: Right Arm, Patient Position: Sitting, Cuff Size: Normal)   Pulse 76   Ht 5' 6"  (1.676 m)   Wt 170 lb 14.4 oz (77.5 kg)   SpO2 97%   BMI 27.58 kg/m   Orthostatics: Lying 124/83, HR 73 Sitting 123/81, HR 84 Standing 125/84, HR 82 Standing 3 min 124/84, HR 84    Wt Readings from Last 3 Encounters:  07/17/21 168 lb (76.2 kg)  06/13/21 169 lb 9.6 oz (76.9 kg)  05/20/21 174 lb (78.9 kg)    GEN: Well nourished,  well developed in no acute distress HEENT: Normal, moist mucous membranes NECK: No JVD CARDIAC: regular rhythm, normal S1 and S2, no rubs or gallops. No murmur. VASCULAR: Radial and DP pulses 2+ bilaterally. No carotid bruits RESPIRATORY:  Clear to auscultation without rales, wheezing or rhonchi  ABDOMEN: Soft, non-tender, non-distended MUSCULOSKELETAL:  Ambulates independently SKIN: Warm and dry, no edema; Varicose veins of LE bilaterally NEUROLOGIC:  Alert and oriented x 3. No focal neuro deficits noted. PSYCHIATRIC:  Normal affect    ASSESSMENT:    1. Nonocclusive coronary atherosclerosis of native coronary artery   2. Aortic atherosclerosis (HCC)   3. Pure hypercholesterolemia   4. Family history of heart disease   5. Counseling on health promotion and  disease prevention   6. Nonischemic cardiomyopathy (HCC)    PLAN:    Low blood pressure at home -orthostatics negative -blood pressure here in the office slightly elevated -stop HCTZ, monitor for worsening edema  Nonobstructive CAD Aortic atherosclerosis Hypercholesterolemia -coronary CT: calcium score 71, nonobstructive CAD noted -discussed aspirin previously. She is tolerating rosuvastatin -LDL goal <70, last 79 -counseled on red flag warning signs that need immediate medical attention  Abnormal echo, with reduced EF consistent with nonischemic cardiomyopathy:  -NYHA class I-II.  -stress test negative for ischemia, remote cath normal, nonobstructive CAD on coronary CT -continue metoprolol succinate 25 mg daily, valsartan 40 mg daily. Limited by low blood pressure -clinically appears euvolemic -recheck echo PRN for change in symptoms  Family history of heart disease: no premature disease, but multiple members of the family with ASCVD  Cardiac risk counseling and prevention recommendations: -recommend heart healthy/Mediterranean diet, with whole grains, fruits, vegetable, fish, lean meats, nuts, and olive oil. Limit salt. -recommend moderate walking, 3-5 times/week for 30-50 minutes each session. Aim for at least 150 minutes.week. Goal should be pace of 3 miles/hours, or walking 1.5 miles in 30 minutes -recommend avoidance of tobacco products. Avoid excess alcohol.  Plan for follow up: 1 year or sooner as needed.   Buford Dresser, MD, PhD, [facility]    Medication Adjustments/Labs and Tests Ordered: Current medicines are reviewed at length with the patient today.  Concerns regarding medicines are outlined above.   Orders Placed This Encounter  Procedures   EKG 12-Lead   No orders of the defined types were placed in this encounter.  Patient Instructions  Medication Instructions:  STOP: Hydrochlorothiazide 12.5 mg daily    *If you need a  refill on your cardiac medications before your next appointment, please call your pharmacy*   Lab Work: None ordered today   Testing/Procedures: None ordered today   Follow-Up: At [facility], you and your health needs are our priority.  As part of our continuing mission to provide you with exceptional heart care, we have created designated Provider Care Teams.  These Care Teams include your primary Cardiologist (physician) and Advanced Practice Providers (APPs -  Physician Assistants and Nurse Practitioners) who all work together to provide you with the care you need, when you need it.  We recommend signing up for the patient portal called "MyChart".  Sign up information is provided on this After Visit Summary.  MyChart is used to connect with patients for Virtual Visits (Telemedicine).  Patients are able to view lab/test results, encounter notes, upcoming appointments, etc.  Non-urgent messages can be sent to your provider as well.   To learn more about what you can do with MyChart, go to [email or web address].    Your  next appointment:   1 year(s)  The format for your next appointment:   In Person  Provider:   Buford Dresser, MD{          I,Jenifer Velasquez,acting as a scribe for Buford Dresser, MD.,have documented all relevant documentation on the behalf of Buford Dresser, MD,as directed by  Buford Dresser, MD while in the presence of Buford Dresser, MD.  I, Buford Dresser, MD, have reviewed all documentation for this visit. The documentation on 08/20/21 for the exam, diagnosis, procedures, and orders are all accurate and complete.   Signed, Buford Dresser, MD PhD, [facility] 08/20/2021     [place]

## 2021-08-20 NOTE — Patient Instructions (Addendum)
Medication Instructions:  STOP: Hydrochlorothiazide 12.5 mg daily    *If you need a refill on your cardiac medications before your next appointment, please call your pharmacy*   Lab Work: None ordered today   Testing/Procedures: None ordered today   Follow-Up: At [name], you and your health needs are our priority.  As part of our continuing mission to provide you with exceptional heart care, we have created designated Provider Care Teams.  These Care Teams include your primary Cardiologist (physician) and Advanced Practice Providers (APPs -  Physician Assistants and Nurse Practitioners) who all work together to provide you with the care you need, when you need it.  We recommend signing up for the patient portal called "MyChart".  Sign up information is provided on this After Visit Summary.  MyChart is used to connect with patients for Virtual Visits (Telemedicine).  Patients are able to view lab/test results, encounter notes, upcoming appointments, etc.  Non-urgent messages can be sent to your provider as well.   To learn more about what you can do with MyChart, go to [email or web address].    Your next appointment:   1 year(s)  The format for your next appointment:   In Person  Provider:   Buford Dresser, MD{

## 2021-08-21 ENCOUNTER — Ambulatory Visit (HOSPITAL_COMMUNITY): Payer: Medicare Other

## 2021-08-26 ENCOUNTER — Ambulatory Visit (HOSPITAL_COMMUNITY): Payer: Medicare Other

## 2021-08-28 ENCOUNTER — Ambulatory Visit (HOSPITAL_COMMUNITY): Payer: Medicare Other

## 2021-09-02 ENCOUNTER — Ambulatory Visit (HOSPITAL_COMMUNITY): Payer: Medicare Other

## 2021-09-04 ENCOUNTER — Ambulatory Visit (HOSPITAL_COMMUNITY): Payer: Medicare Other

## 2021-09-09 ENCOUNTER — Ambulatory Visit (HOSPITAL_COMMUNITY): Payer: Medicare Other

## 2021-09-11 ENCOUNTER — Ambulatory Visit (HOSPITAL_COMMUNITY): Payer: Medicare Other

## 2021-09-15 ENCOUNTER — Telehealth: Payer: Self-pay | Admitting: *Deleted

## 2021-09-15 ENCOUNTER — Ambulatory Visit: Payer: Medicare Other | Admitting: Pulmonary Disease

## 2021-09-15 ENCOUNTER — Encounter: Payer: Self-pay | Admitting: Pulmonary Disease

## 2021-09-15 VITALS — BP 130/90 | HR 81 | Ht 66.5 in | Wt 170.0 lb

## 2021-09-15 DIAGNOSIS — R911 Solitary pulmonary nodule: Secondary | ICD-10-CM | POA: Diagnosis not present

## 2021-09-15 DIAGNOSIS — J449 Chronic obstructive pulmonary disease, unspecified: Secondary | ICD-10-CM | POA: Diagnosis not present

## 2021-09-15 DIAGNOSIS — J309 Allergic rhinitis, unspecified: Secondary | ICD-10-CM

## 2021-09-15 NOTE — Patient Instructions (Addendum)
Thank you for visiting Dr. Valeta Harms at [facility]. Today we recommend the following:  Continue current inhaler regimen and azithromycin MWF   Please send new prescription to [facility] for April [name]   Return in about 6 months (around 03/16/2022) for with APP or Dr. Valeta Harms.    Please do your part to reduce the spread of COVID-19.
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
## 2021-09-15 NOTE — Patient Outreach (Signed)
  Care Coordination   09/15/2021 Name: April [name] MRN: [id number] DOB: April 22, 1955   Care Coordination Outreach Attempts:  An unsuccessful telephone outreach was attempted today to offer the patient information about available care coordination services as a benefit of their health plan.   Follow Up Plan:  Additional outreach attempts will be made to offer the patient care coordination information and services.   Encounter Outcome:  No Answer  Care Coordination Interventions Activated:  No   Care Coordination Interventions:  No, not indicated    Emelia Loron RN, BSN [place] [phone] [email or web address]

## 2021-09-16 ENCOUNTER — Ambulatory Visit (HOSPITAL_COMMUNITY): Payer: Medicare Other

## 2021-09-18 ENCOUNTER — Ambulatory Visit (HOSPITAL_COMMUNITY): Payer: Medicare Other

## 2021-09-23 ENCOUNTER — Encounter (HOSPITAL_BASED_OUTPATIENT_CLINIC_OR_DEPARTMENT_OTHER): Payer: Self-pay | Admitting: Cardiology

## 2021-09-23 ENCOUNTER — Ambulatory Visit (HOSPITAL_COMMUNITY): Payer: Medicare Other

## 2021-09-24 ENCOUNTER — Encounter: Payer: Self-pay | Admitting: Gastroenterology

## 2021-09-25 ENCOUNTER — Ambulatory Visit (HOSPITAL_COMMUNITY): Payer: Medicare Other

## 2021-09-30 ENCOUNTER — Ambulatory Visit (HOSPITAL_COMMUNITY): Payer: Medicare Other

## 2021-10-02 ENCOUNTER — Ambulatory Visit (HOSPITAL_COMMUNITY): Payer: Medicare Other

## 2021-10-07 ENCOUNTER — Encounter: Payer: Self-pay | Admitting: Family Medicine

## 2021-10-07 ENCOUNTER — Ambulatory Visit (HOSPITAL_COMMUNITY): Payer: Medicare Other

## 2021-10-07 DIAGNOSIS — R269 Unspecified abnormalities of gait and mobility: Secondary | ICD-10-CM

## 2021-10-08 ENCOUNTER — Ambulatory Visit (INDEPENDENT_AMBULATORY_CARE_PROVIDER_SITE_OTHER): Payer: Medicare Other

## 2021-10-08 DIAGNOSIS — Z23 Encounter for immunization: Secondary | ICD-10-CM | POA: Diagnosis not present

## 2021-10-08 NOTE — Telephone Encounter (Signed)
DME order done and given to patient while here for her flu shot.

## 2021-10-09 ENCOUNTER — Ambulatory Visit (HOSPITAL_COMMUNITY): Payer: Medicare Other

## 2021-10-16 ENCOUNTER — Emergency Department (HOSPITAL_COMMUNITY): Payer: Medicare Other

## 2021-10-16 ENCOUNTER — Encounter (HOSPITAL_COMMUNITY): Payer: Self-pay | Admitting: Emergency Medicine

## 2021-10-16 ENCOUNTER — Telehealth: Payer: Self-pay | Admitting: Family Medicine

## 2021-10-16 ENCOUNTER — Emergency Department (HOSPITAL_COMMUNITY)
Admission: EM | Admit: 2021-10-16 | Discharge: 2021-10-16 | Disposition: A | Discharge status: Home / Self Care | Payer: Medicare Other | Attending: Emergency Medicine | Admitting: Emergency Medicine

## 2021-10-16 DIAGNOSIS — M25511 Pain in right shoulder: Secondary | ICD-10-CM | POA: Diagnosis not present

## 2021-10-16 DIAGNOSIS — J439 Emphysema, unspecified: Secondary | ICD-10-CM | POA: Diagnosis not present

## 2021-10-16 DIAGNOSIS — M542 Cervicalgia: Secondary | ICD-10-CM | POA: Diagnosis not present

## 2021-10-16 DIAGNOSIS — W06XXXA Fall from bed, initial encounter: Secondary | ICD-10-CM | POA: Diagnosis not present

## 2021-10-16 DIAGNOSIS — W19XXXA Unspecified fall, initial encounter: Secondary | ICD-10-CM | POA: Diagnosis not present

## 2021-10-16 DIAGNOSIS — R42 Dizziness and giddiness: Secondary | ICD-10-CM | POA: Diagnosis not present

## 2021-10-16 DIAGNOSIS — Y92009 Unspecified place in unspecified non-institutional (private) residence as the place of occurrence of the external cause: Secondary | ICD-10-CM | POA: Insufficient documentation

## 2021-10-16 DIAGNOSIS — S4991XA Unspecified injury of right shoulder and upper arm, initial encounter: Secondary | ICD-10-CM | POA: Diagnosis present

## 2021-10-16 DIAGNOSIS — S41111A Laceration without foreign body of right upper arm, initial encounter: Secondary | ICD-10-CM | POA: Insufficient documentation

## 2021-10-16 DIAGNOSIS — M4802 Spinal stenosis, cervical region: Secondary | ICD-10-CM | POA: Diagnosis not present

## 2021-10-16 DIAGNOSIS — Z743 Need for continuous supervision: Secondary | ICD-10-CM | POA: Diagnosis not present

## 2021-10-16 DIAGNOSIS — S41112A Laceration without foreign body of left upper arm, initial encounter: Secondary | ICD-10-CM | POA: Insufficient documentation

## 2021-10-16 DIAGNOSIS — R404 Transient alteration of awareness: Secondary | ICD-10-CM | POA: Diagnosis not present

## 2021-10-16 DIAGNOSIS — R6889 Other general symptoms and signs: Secondary | ICD-10-CM | POA: Diagnosis not present

## 2021-10-16 DIAGNOSIS — R079 Chest pain, unspecified: Secondary | ICD-10-CM | POA: Diagnosis not present

## 2021-10-16 LAB — CBC
HCT: 37.6 % (ref 36.0–46.0)
Hemoglobin: 12.1 g/dL (ref 12.0–15.0)
MCH: 30.7 pg (ref 26.0–34.0)
MCHC: 32.2 g/dL (ref 30.0–36.0)
MCV: 95.4 fL (ref 80.0–100.0)
Platelets: 181 10*3/uL (ref 150–400)
RBC: 3.94 MIL/uL (ref 3.87–5.11)
RDW: 13.1 % (ref 11.5–15.5)
WBC: 6.9 10*3/uL (ref 4.0–10.5)
nRBC: 0 % (ref 0.0–0.2)

## 2021-10-16 LAB — BASIC METABOLIC PANEL
Anion gap: 5 (ref 5–15)
BUN: 21 mg/dL (ref 8–23)
CO2: 27 mmol/L (ref 22–32)
Calcium: 9.6 mg/dL (ref 8.9–10.3)
Chloride: 109 mmol/L (ref 98–111)
Creatinine, Ser: 1.12 mg/dL — ABNORMAL HIGH (ref 0.44–1.00)
GFR, Estimated: 54 mL/min — ABNORMAL LOW (ref >60)
Glucose, Bld: 103 mg/dL — ABNORMAL HIGH (ref 70–99)
Potassium: 3.9 mmol/L (ref 3.5–5.1)
Sodium: 141 mmol/L (ref 135–145)

## 2021-10-16 MED ORDER — BACITRACIN ZINC 500 UNIT/GM EX OINT: 1.0000 | TOPICAL_OINTMENT | Freq: Two times a day (BID) | CUTANEOUS | 0 refills | Status: DC

## 2021-10-16 MED ORDER — MECLIZINE HCL 25 MG PO TABS
25.0000 mg | ORAL_TABLET | Freq: Once | ORAL | Status: AC
  Administered 2021-10-16: 25 mg via ORAL
  Filled 2021-10-16: qty 1

## 2021-10-16 MED ORDER — LACTATED RINGERS IV BOLUS
1000.0000 mL | Freq: Once | INTRAVENOUS | Status: AC
  Administered 2021-10-16: 1000 mL via INTRAVENOUS

## 2021-10-16 MED ORDER — MECLIZINE HCL 25 MG PO TABS: 25.0000 mg | ORAL_TABLET | Freq: Three times a day (TID) | ORAL | 0 refills | Status: DC | PRN

## 2021-10-16 MED ORDER — ACETAMINOPHEN 325 MG PO TABS
650.0000 mg | ORAL_TABLET | Freq: Once | ORAL | Status: AC
  Administered 2021-10-16: 650 mg via ORAL
  Filled 2021-10-16: qty 2

## 2021-10-16 NOTE — Telephone Encounter (Signed)
LMTCB

## 2021-10-16 NOTE — Telephone Encounter (Signed)
Pt's friend, April [name], call to let April [name] know pt is being transported to [facility] due to her falling twice, 10/16/21. April [name] stated the pt isn't doing well & she is currently living at [facility] (room [address]), a independent living facility. April [name] stated pt is having some mental issues going on because it doesn't seem like she isn't in her right mind. April [name] stated she think pt needs to be in assisted living & not independent living. April [name] stated pt often states she would rather not be living anymore & April [name] thinks pt is going into depression. April [name] states April [name] (pt's husband, now separated) would be pt's next on kin to make any decisions, # [phone]. April [name] just wants advice on what can be done to help pt? Call back # [phone]

## 2021-10-16 NOTE — Telephone Encounter (Signed)
Patient best friend Lenise Arena ([phone]) called back in with an update. She stated that EMTs took her to [facility]. She stated that she can't live independent by herself anymore. She has no family to help her and she is thinking about moving to [facility] in [place] ([phone]). She stated that they where discussing her becoming her medical POA once she moves to [facility], as her and her husband Marlou Sa are now separated. She stated that she would like to be kept in the loop as she the only one that cares about her. She stated that she has been in a lot of stress and don't want to be here anymore. She stated that Dr. Damita Dunnings would need to evaluate her and make a recommendation for her to move to the assisted living near her. She stated to please contact [facility] to check on her.

## 2021-10-16 NOTE — Telephone Encounter (Signed)
We can only give information to the DPR on file or the patient.  I need to await the ER notes.  Please have her direct her concerns and information to the ER.  That is the best advice I can give right now.  And I thank her for the update.

## 2021-10-16 NOTE — ED Triage Notes (Signed)
Patient here from [place] reporting dizziness with fall this am getting out of bed. Hx of vertigo, no blood thinners. Denies hitting head. Reports left shoulder pain.

## 2021-10-16 NOTE — ED Provider Notes (Signed)
[facility] DEPT Provider Note   CSN: [id number] Arrival date & time: 10/16/21  4580     History Chief Complaint  Patient presents with   Dizziness   Fall    HPI April [name] is a 66 y.o. female presenting for lightheadedness and ground-level fall this morning.  She is an otherwise healthy 66 year old female who has orthostatic hypotension and states that when she gets up in the morning she always feels lightheaded.  Today she was in a rush to make it to the bathroom and when she stood up she lost her balance.  She fell over hitting her right arm on a side table knocking over a glass.  She denies hitting her head.  She does have some neck pain.  She denies fevers or chills, nausea vomiting, syncope or shortness of breath.  Bilateral arm skin tears appreciated..   Patient's recorded medical, surgical, social, medication list and allergies were reviewed in the Snapshot window as part of the initial history.   Review of Systems   Review of Systems  Constitutional:  Negative for chills and fever.  HENT:  Negative for ear pain and sore throat.   Eyes:  Negative for pain and visual disturbance.  Respiratory:  Negative for cough and shortness of breath.   Cardiovascular:  Negative for chest pain and palpitations.  Gastrointestinal:  Negative for abdominal pain and vomiting.  Genitourinary:  Negative for dysuria and hematuria.  Musculoskeletal:  Negative for arthralgias and back pain.  Skin:  Negative for color change and rash.  Neurological:  Negative for seizures and syncope.  All other systems reviewed and are negative.   Physical Exam Updated Vital Signs BP (!) 163/145   Pulse 77   Temp 98.3 F (36.8 C) (Oral)   Resp 15   SpO2 98%  Physical Exam Vitals and nursing note reviewed.  Constitutional:      General: She is not in acute distress.    Appearance: She is well-developed.  HENT:     Head: Normocephalic and atraumatic.  Eyes:      Conjunctiva/sclera: Conjunctivae normal.  Cardiovascular:     Rate and Rhythm: Normal rate and regular rhythm.     Heart sounds: No murmur heard. Pulmonary:     Effort: Pulmonary effort is normal. No respiratory distress.     Breath sounds: Normal breath sounds.  Abdominal:     Palpations: Abdomen is soft.     Tenderness: There is no abdominal tenderness.  Musculoskeletal:        General: Tenderness (TTP of the right shoulder.) and signs of injury (Bilateral upper extremity skin tears appreciated with no deep laceration.) present. No swelling. Normal range of motion.     Cervical back: Neck supple. Tenderness present.  Skin:    General: Skin is warm and dry.     Capillary Refill: Capillary refill takes less than 2 seconds.  Neurological:     Mental Status: She is alert.  Psychiatric:        Mood and Affect: Mood normal.      ED Course/ Medical Decision Making/ A&P    Procedures Procedures   Medications Ordered in ED Medications  acetaminophen (TYLENOL) tablet 650 mg (650 mg Oral Given 10/16/21 1014)  lactated ringers bolus 1,000 mL (1,000 mLs Intravenous New Bag/Given 10/16/21 1349)  meclizine (ANTIVERT) tablet 25 mg (25 mg Oral Given 10/16/21 1349)   Medical Decision Making:    April [name] is a 66 y.o. female who  presented to the ED today with a moderate mechanisma trauma, detailed above.    Patient's presentation is complicated by their history of advanced age, steroid use secondary to COPD.  Patient placed on continuous vitals and telemetry monitoring while in ED which was reviewed periodically.   Given this mechanism of trauma, a full physical exam was performed. Notably, patient was hemodynamically stable in no acute distress.  Minor skin tears appreciated on the upper extremities.  Mild neck tenderness.  Pain with extremity motion of the right upper extremity  Reviewed and confirmed nursing documentation for past medical history, family history, social history.     Initial Assessment/Plan:   This is a patient presenting with a moderate mechanism trauma.  As such, I have considered intracranial injuries including intracranial hemorrhage, intrathoracic injuries including blunt myocardial or blunt lung injury, blunt abdominal injuries including aortic dissection, bladder injury, spleen injury, liver injury and I have considered orthopedic injuries including extremity or spinal injury.  With the patient's presentation of moderate mechanism trauma but an otherwise reassuring exam, patient warrants targeted evaluation for potential traumatic injuries. Will proceed with targeted evaluation for potential injuries. Will proceed with CTH/Cspine, chest x-ray, right shoulder x-ray, laceration repairs. Objective evaluation resulted with no acute pathology.   Concerning patient's episode of dizziness.  It is likely related to recurrence of her vertigo.  It is completely resolved.  She is ambulatory tolerating p.o. intake in no acute distress.  Considered TIA, however the intermittent nature of the episodes of dizziness, duration of symptoms seem less consistent.  Hints exam was performed and demonstrates leftward beats of nystagmus, corrective saccade on head impulse, no skew abnormality.  Shared medical decision making with patient regarding further work-up for TIA versus CVA, given peripheral lesion identified on hints exam, patient would prefer to be discharged to go back to her facility.  No acute indication for further intervention right now as her symptoms completely resolved.  She states that he she used to take meclizine and would like to have a prescription for it as she believes is helping. Notably, chart review revealed that patient's family member has been calling her primary care doctor with concerns about patient living at home.  Patient and friend had a prolonged conversation and patient has her own medical decision making.  Patient would like to return back to her  independent living facility and she will plan to follow-up with her primary doctor for evaluation for escalation of needs of care in the outpatient setting.    Disposition:  Based on the above findings, I believe patient is stable for discharge.    Patient/family educated about specific return precautions for given chief complaint and symptoms.  Patient/family educated about follow-up with PCP.     Patient/family expressed understanding of return precautions and need for follow-up. Patient spoken to regarding all imaging and laboratory results and appropriate follow up for these results. All education provided in verbal form with additional information in written form. Time was allowed for answering of patient questions. Patient discharged.    Emergency Department Medication Summary:   Medications  acetaminophen (TYLENOL) tablet 650 mg (650 mg Oral Given 10/16/21 1014)  lactated ringers bolus 1,000 mL (1,000 mLs Intravenous New Bag/Given 10/16/21 1349)  meclizine (ANTIVERT) tablet 25 mg (25 mg Oral Given 10/16/21 1349)    Clinical Impression:  1. Fall in home, initial encounter   2. Dizziness      Data Unavailable   Final Clinical Impression(s) / ED Diagnoses Final diagnoses:  Fall in home, initial encounter  Dizziness    Rx / DC Orders ED Discharge Orders          Ordered    meclizine (ANTIVERT) 25 MG tablet  3 times daily PRN        10/16/21 1516              Tretha Sciara, MD 10/16/21 1521

## 2021-10-16 NOTE — Telephone Encounter (Signed)
Per last DPR on file we can only speak with the patients son. Not sure what you would want to do here?

## 2021-10-16 NOTE — ED Notes (Signed)
Patient ambulated to bathroom.

## 2021-10-17 ENCOUNTER — Telehealth: Payer: Self-pay

## 2021-10-17 NOTE — Chronic Care Management (AMB) (Signed)
Chronic Care Management Pharmacy Assistant   Name: April [name]  MRN: [id number] DOB: June 25, 1955   Reason for Encounter: Reminder Call   Hospital visits:  10/16/21-April Countryman,MD([facility])-fall,dizziness Concerning patient's episode of dizziness.  It is likely related to recurrence of her vertigo.  It is completely resolved.  She is ambulatory tolerating p.o. intake in no acute distress.  Considered TIA, however the intermittent nature of the episodes of dizziness, duration of symptoms seem less consistent.  Hints exam was performed and demonstrates leftward beats of nystagmus, corrective saccade on head impulse, no skew abnormality.  Shared medical decision making with patient regarding further work-up for TIA versus CVA, given peripheral lesion identified on hints exam, patient would prefer to be discharged to go back to her facility.  No acute indication for further intervention right now as her symptoms completely resolved.  She states that he she used to take meclizine and would like to have a prescription for it as she believes is helping. Notably, chart review revealed that patient's family member has been calling her primary care doctor with concerns about patient living at home.  Patient and friend had a prolonged conversation and patient has her own medical decision making.  Patient would like to return back to her independent living facility and she will plan to follow-up with her primary doctor for evaluation for escalation of needs of care in the outpatient setting.  Medications: Outpatient Encounter Medications as of 10/17/2021  Medication Sig Note   acetaminophen (TYLENOL) 500 MG tablet Take 1,000 mg by mouth every 6 (six) hours as needed for moderate pain or headache.    albuterol (VENTOLIN HFA) 108 (90 Base) MCG/ACT inhaler Inhale 2 puffs into the lungs every 6 (six) hours as needed for wheezing or shortness of breath. Okay to dispense proair/Ventolin/albuterol.     bacitracin ointment Apply 1 Application topically 2 (two) times daily.    Budeson-Glycopyrrol-Formoterol (BREZTRI AEROSPHERE) 160-9-4.8 MCG/ACT AERO Inhale 2 puffs into the lungs every 12 (twelve) hours.    buPROPion (WELLBUTRIN XL) 300 MG 24 hr tablet TAKE 1 TABLET BY MOUTH  DAILY    Calcium 500 MG CHEW Chew 500 mg by mouth 2 (two) times daily.     Cholecalciferol (DIALYVITE VITAMIN D 5000) 125 MCG (5000 UT) capsule 1 tab a day except for 2 on Sundays 04/22/2021: Take 2 on Saturday and sunday   ibandronate (BONIVA) 150 MG tablet Take 1 tablet (150 mg total) by mouth every 30 (thirty) days. Take in the morning with a full glass of water, on an empty stomach, and do not take anything else by mouth or lie down for the next 30 min.    levothyroxine (SYNTHROID) 125 MCG tablet TAKE 1 TABLET BY MOUTH  DAILY EXCEPT TAKE 1 AND 1/2 TABLETS ON SUNDAY    meclizine (ANTIVERT) 25 MG tablet Take 1 tablet (25 mg total) by mouth 3 (three) times daily as needed for dizziness.    metoprolol succinate (TOPROL-XL) 25 MG 24 hr tablet TAKE 1 TABLET BY MOUTH  DAILY    Polyethyl Glycol-Propyl Glycol (SYSTANE OP) Place 1 drop into both eyes as needed.    rosuvastatin (CRESTOR) 10 MG tablet Take 1 tablet (10 mg total) by mouth daily.    Spacer/Aero-Holding Chambers (AEROCHAMBER MV) inhaler Use as instructed    tiZANidine (ZANAFLEX) 4 MG tablet TAKE 1/2 TO 1 TABLET BY  MOUTH EVERY 6 HOURS AS  NEEDED FOR MUSCLE SPASMS    valsartan (DIOVAN) 40 MG  tablet TAKE 1 TABLET BY MOUTH  DAILY    vitamin C (ASCORBIC ACID) 250 MG tablet Take 500 mg by mouth daily.    No facility-administered encounter medications on file as of 10/17/2021.   April [name] was contacted to remind of upcoming telephone visit with April [name] on 10/22/21 at 11:00. Patient was reminded to have any blood glucose and blood pressure readings available for review at appointment.   Patient confirmed appointment.  Are you having any problems with your  medications? No  The patient stated that she had address change and is having difficulty getting through to [employer] to let them know, last refill went to wrong address. I will call for the patient.  Do you have any concerns you like to discuss with the pharmacist? No  CCM referral has been placed prior to visit?  No   Star Rating Drugs: Medication:  Last Fill: Day Supply Rosuvastatin '10mg'$  08/10/21  100 Valsartan '40mg'$  08/10/21  [place], CPP notified  April [name], [place]  [phone]

## 2021-10-17 NOTE — Telephone Encounter (Signed)
Called pharmacy and updated unit we have on file with them.

## 2021-10-17 NOTE — Telephone Encounter (Signed)
[facility] Night - Client Nonclinical Telephone Record  [facility] Client [facility] Night - Client Client Site [facility] - Night Provider Renford Dills - MD Contact Type Call Who Is Calling Patient / Member / Family / Caregiver Caller Name Banks Phone Number [phone] Patient Name April [name] Patient DOB Oct 20, 1955 Call Type Message Only Information Provided Reason for Call Request for General Office Information Initial Comment Caller states she is the friend of the patient who is at [facility]. She was told she needs a consent signed so they can talk to her. Her pharmacy needs the correct address because the unit number changed and needs the office to call them to give them this information. The pharmacy is [place] and the phone number is [phone]. Her friend is due a shipment but it needs to be sent to the new unit. Additional Comment The patient's phone number is [phone]. Disp. Time Disposition Final User 10/16/2021 5:21:08 PM General Information Provided Yes [place], Stefanie Call Closed By: Corey Harold Transaction Date/Time: 10/16/2021 5:14:48 PM (ET

## 2021-10-21 ENCOUNTER — Encounter (HOSPITAL_COMMUNITY): Payer: Self-pay

## 2021-10-21 ENCOUNTER — Telehealth (HOSPITAL_COMMUNITY): Payer: Self-pay

## 2021-10-21 ENCOUNTER — Telehealth: Payer: Self-pay

## 2021-10-21 NOTE — Progress Notes (Cosign Needed)
Chronic Care Management Pharmacy Assistant   Name: April [name]  MRN: [id number] DOB: 1955/09/30  Reason for Encounter: CCM [facility] Follow-Up)  Medications: Outpatient Encounter Medications as of 10/21/2021  Medication Sig Note   acetaminophen (TYLENOL) 500 MG tablet Take 1,000 mg by mouth every 6 (six) hours as needed for moderate pain or headache.    albuterol (VENTOLIN HFA) 108 (90 Base) MCG/ACT inhaler Inhale 2 puffs into the lungs every 6 (six) hours as needed for wheezing or shortness of breath. Okay to dispense proair/Ventolin/albuterol.    bacitracin ointment Apply 1 Application topically 2 (two) times daily.    Budeson-Glycopyrrol-Formoterol (BREZTRI AEROSPHERE) 160-9-4.8 MCG/ACT AERO Inhale 2 puffs into the lungs every 12 (twelve) hours.    buPROPion (WELLBUTRIN XL) 300 MG 24 hr tablet TAKE 1 TABLET BY MOUTH  DAILY    Calcium 500 MG CHEW Chew 500 mg by mouth 2 (two) times daily.     Cholecalciferol (DIALYVITE VITAMIN D 5000) 125 MCG (5000 UT) capsule 1 tab a day except for 2 on Sundays 04/22/2021: Take 2 on Saturday and sunday   ibandronate (BONIVA) 150 MG tablet Take 1 tablet (150 mg total) by mouth every 30 (thirty) days. Take in the morning with a full glass of water, on an empty stomach, and do not take anything else by mouth or lie down for the next 30 min.    levothyroxine (SYNTHROID) 125 MCG tablet TAKE 1 TABLET BY MOUTH  DAILY EXCEPT TAKE 1 AND 1/2 TABLETS ON SUNDAY    meclizine (ANTIVERT) 25 MG tablet Take 1 tablet (25 mg total) by mouth 3 (three) times daily as needed for dizziness.    metoprolol succinate (TOPROL-XL) 25 MG 24 hr tablet TAKE 1 TABLET BY MOUTH  DAILY    Polyethyl Glycol-Propyl Glycol (SYSTANE OP) Place 1 drop into both eyes as needed.    rosuvastatin (CRESTOR) 10 MG tablet Take 1 tablet (10 mg total) by mouth daily.    Spacer/Aero-Holding Chambers (AEROCHAMBER MV) inhaler Use as instructed    tiZANidine (ZANAFLEX) 4 MG tablet TAKE 1/2 TO 1 TABLET BY   MOUTH EVERY 6 HOURS AS  NEEDED FOR MUSCLE SPASMS    valsartan (DIOVAN) 40 MG tablet TAKE 1 TABLET BY MOUTH  DAILY    vitamin C (ASCORBIC ACID) 250 MG tablet Take 500 mg by mouth daily.    No facility-administered encounter medications on file as of 10/21/2021.   Reviewed hospital notes for details of recent visit. Has patient been contacted by Transitions of Care team? No Has patient seen PCP/specialist for hospital follow up (summarize OV if yes): No  Admitted to the ED on 10/16/2021. Discharge date was 10/16/2021.  Discharged from [facility].   Discharge diagnosis (Principal Problem): Fall in home and Dizziness Patient was discharged to Home  Brief summary of hospital course: This is a patient presenting with a moderate mechanism trauma.  As such, I have considered intracranial injuries including intracranial hemorrhage, intrathoracic injuries including blunt myocardial or blunt lung injury, blunt abdominal injuries including aortic dissection, bladder injury, spleen injury, liver injury and I have considered orthopedic injuries including extremity or spinal injury.   With the patient's presentation of moderate mechanism trauma but an otherwise reassuring exam, patient warrants targeted evaluation for potential traumatic injuries. Will proceed with targeted evaluation for potential injuries. Will proceed with CTH/Cspine, chest x-ray, right shoulder x-ray, laceration repairs. Objective evaluation resulted with no acute pathology.    Concerning patient's episode of dizziness.  It  is likely related to recurrence of her vertigo.  It is completely resolved.  She is ambulatory tolerating p.o. intake in no acute distress.  Considered TIA, however the intermittent nature of the episodes of dizziness, duration of symptoms seem less consistent.  Hints exam was performed and demonstrates leftward beats of nystagmus, corrective saccade on head impulse, no skew abnormality.  Shared medical  decision making with patient regarding further work-up for TIA versus CVA, given peripheral lesion identified on hints exam, patient would prefer to be discharged to go back to her facility.  No acute indication for further intervention right now as her symptoms completely resolved.  She states that he she used to take meclizine and would like to have a prescription for it as she believes is helping. Notably, chart review revealed that patient's family member has been calling her primary care doctor with concerns about patient living at home.  Patient and friend had a prolonged conversation and patient has her own medical decision making.  Patient would like to return back to her independent living facility and she will plan to follow-up with her primary doctor for evaluation for escalation of needs of care in the outpatient setting.  Based on the above findings, I believe patient is stable for discharge.    New?Medications Started at [facility] Discharge:?? -Started bacitracin ointment -Started meclizine (ANTIVERT) 25 MG tablet  Medications that remain the same after Hospital Discharge:??  -All other medications will remain the same.    Next CCM appt: 10/22/2021  Other upcoming appts: PCP appointment on 11/24/2021 for Lab PCP appointment on 12/01/2021 for Physical Gastro appointment on 12/12/2021  [name], PharmD notified and will determine if action is needed.  [name], CPP notified  Marijean Niemann, [place] Clinical Pharmacy Assistant [phone]   Pharmacist addendum: CCM visit 10/22/21, will discuss.  [name], PharmD, BCACP 10/22/21 10:00 AM

## 2021-10-21 NOTE — Telephone Encounter (Signed)
Attempted to call patient in regards to Pulmonary Rehab - LM on VM Mailed letter 

## 2021-10-22 ENCOUNTER — Ambulatory Visit: Payer: Medicare Other | Admitting: Pharmacist

## 2021-10-22 DIAGNOSIS — M810 Age-related osteoporosis without current pathological fracture: Secondary | ICD-10-CM

## 2021-10-22 DIAGNOSIS — I700 Atherosclerosis of aorta: Secondary | ICD-10-CM

## 2021-10-22 DIAGNOSIS — J449 Chronic obstructive pulmonary disease, unspecified: Secondary | ICD-10-CM

## 2021-10-22 DIAGNOSIS — I10 Essential (primary) hypertension: Secondary | ICD-10-CM

## 2021-10-22 DIAGNOSIS — F341 Dysthymic disorder: Secondary | ICD-10-CM

## 2021-10-22 NOTE — Patient Instructions (Signed)
Visit Information  Phone number for Pharmacist: [phone]   Goals Addressed   None     Care Plan : [place]  Updates made by Charlton Haws, RPH since 10/22/2021 12:00 AM     Problem: Hypertension, Hyperlipidemia, COPD, Depression, Anxiety, and Osteoporosis   Priority: High     Long-Range Goal: Disease Management   Start Date: 11/04/2020  Expected End Date: 04/30/2022  This Visit's Progress: On track  Recent Progress: On track  Priority: High  Note:   Current Barriers:  Due for lipid panel  Pharmacist Clinical Goal(s):  Patient will contact provider office for questions/concerns as evidenced notation of same in electronic health record through collaboration with PharmD and provider.   Interventions: 1:1 collaboration with [name], MD regarding development and update of comprehensive plan of care as evidenced by provider attestation and co-signature Inter-disciplinary care team collaboration (see longitudinal plan of care) Comprehensive medication review performed; medication list updated in electronic medical record  Hypertension (BP goal <140/90) -Query controlled - BP 120s/70s at home; recently in ED due to fall (dizziness, vertigo); pt has seen nurse in her ALF and they recommended PT/OT for balance, pt is willing  -Current treatment: Metoprolol succinate 25 mg daily - Appropriate, Effective, Safe, Accessible Valsartan 40 mg daily - Appropriate, Effective, Safe, Accessible -Patient has failed these meds in the past: amlodipine, HCTZ -Medications previously tried: n/a -Current dietary habits: limits caffeine/salt -Current exercise habits: not discussed today -Educated on BP goals and benefits of medications for prevention of heart attack, stroke and kidney damage; Proper BP monitoring technique -Counseled to monitor BP at home weekly AND if feeling dizzy -Recommended to continue current medication;  Recommend PT/OT for balance - nurse is  faxing order to PCP today  Hyperlipidemia: (LDL goal < 70) -Query controlled - LDL improved to 79 (05/2021) from 128 (11/2020) after starting rosuvastatin 5 mg- 39% reduction, dose was increased to 10 mg after 05/2021 labs -Pt does not want to be taking a statin, her husband had side effects but she is tolerating fine so far -s/p Coronary CT - aortic atherosclerosis -Current treatment: Rosuvastatin 10 mg daily - Appropriate, Query Effective Aspirin 81 mg daily - Appropriate, Effective, Safe, Accessible -Medications previously tried: n/a  -Educated on Cholesterol goals; Benefits of statin for ASCVD risk reduction; -Recommended to continue current medication; repeat lipids at upcoming CPE Nov 2023  COPD (Goal: control symptoms and prevent exacerbations) -Controlled - pt receiving Breztri through [facility], she has had to update address multiple times, most recently CCM team has updated address 10/13 to her current one and refill is being shipper there now -Gold Grade: Gold 4 (FEV1<40%) -Pulmonary function testing: 12/2018 FEV1 34% predicted -Exacerbations requiring treatment in last 6 months: 1  -Current treatment  Breztri (PAP) - 2 puffs every 12 hours - Appropriate, Effective, Safe, Accessible Albuterol nebulizer PRN -Appropriate, Effective, Safe, Accessible Albuterol HFA PRN -Appropriate, Effective, Safe, Accessible Azithromycin 250 mg MWF - Appropriate, Effective, Safe, Accessible -Medications previously tried: Advair, Spiriva, Allegra -Patient reports consistent use of maintenance inhaler -Frequency of rescue inhaler use: 3-5 times/week -Recommended to continue current medication  Osteoporosis (Goal: prevent fracture) -Controlled - pt started ibandronate 04/18/21, tolerating so far -Last DEXA Scan: 02/2021   T-Score lumbar spine: -2.7  T-Score forearm radius: -1.8 -Patient is a candidate for pharmacologic treatment due to T-Score < -2.5 in lumbar spine -Current treatment  Ibandronate  150 mg monthly - Appropriate, Query Effective Vitamin D 5000 IU daily -  Appropriate, Effective, Safe, Accessible -Medications previously tried: Forteo x 18 months, Foasmax (GI effects)  -Recommend weight-bearing and muscle strengthening exercises for building and maintaining bone density. -Recommended to continue current medication  Depression/Anxiety (Goal: manage symptoms) -Not ideally controlled - pt reports improvement in mood on bupropion, but she is taking it at night and reports trouble sleeping/fatigue during the day -PHQ9: 0 (11/2020) - minimal depression -Follows with PCP for mental health support -Current treatment  Bupropion XL 300 mg daily PM - Query Appropriate, Effective, Safe, Accessible -Medications previously tried: alprazolam -Recommended to continue current medication  Vertigo (Goal: manage dizziness) -Controlled - pt reports meclizine has helped -Current treatment  Meclizine 25 mg daily - Appropriate, Effective, Safe, Accessible -Medications previously tried: n/a  -Recommended to continue current medication  Patient Goals/Self-Care Activities Patient will:  - take medications as prescribed as evidenced by patient report and record review focus on medication adherence by routine       Patient verbalizes understanding of instructions and care plan provided today and agrees to view in [place]. Active MyChart status and patient understanding of how to access instructions and care plan via MyChart confirmed with patient.    Telephone follow up appointment with pharmacy team member scheduled for: 3 months  [name], PharmD, [facility] Clinical Pharmacist [facility] [phone]

## 2021-10-22 NOTE — Progress Notes (Signed)
Chronic Care Management Pharmacy Note  10/22/2021 Name:  April [name] MRN:  [id number] DOB:  1955-01-13  Summary: CCM F/U visit -Recent ED visit for fall/dizziness - pt was given meclizine and says this has been helping, but she still has dizziness with standing; she reports BP is 120-130s/70s; of note, earlier this year cardiology found her negative for orthostasis; pt may benefit from PT/OT for balance -Pt has been having trouble with Breztri/AZ&Me due to multiple address changes this year; CCM team updated AZ&Me with current address on 10/13 and inhaler is being shipped -HLD: LDL initially improved 128 > 79 on rosuvastatin 5 mg, dose was increased to 10 mg in May 2023 to target LDL goal < 70  Recommendations/Changes made from today's visit: -No med changes -Recommend PT/OT for balance - nurse at ALF has already mentioned this to her and is faxing over an order request to PCP today -Repeat lipid panel at upcoming CPE  Plan: -Pharmacist follow up televisit scheduled for 3 months -PCP annual 12/01/21    Subjective: PATRECIA [name] is an 66 y.o. year old female who is a primary patient of Damita Dunnings, Elveria Rising, MD.  The CCM team was consulted for assistance with disease management and care coordination needs.    Engaged with patient by telephone for follow up visit in response to provider referral for pharmacy case management and/or care coordination services.   Consent to Services:  The patient was given information about Chronic Care Management services, agreed to services, and gave verbal consent prior to initiation of services.  Please see initial visit note for detailed documentation.   Patient Care Team: [name], MD as PCP - General (Family Medicine) Buford Dresser, MD as PCP - Cardiology (Cardiology) Bo Merino, MD as Consulting Physician (Rheumatology) Charlton Haws, [facility] as Pharmacist (Pharmacist)  Recent office visits: 07/17/21 Dr Damita Dunnings VV:  covid exposure - advanced care planning/DNR form completed.  03/21/21 Dr Damita Dunnings VV: f/u osteoporosis. Did not tolerate Fosamax. Was previously on Forteo x 18 months. Not tried Prolia. Most reasonable option is to restart Boniva.  Recent consult visits: 09/15/21 Dr Valeta Harms (Pulmonology): COPD - lung nodule w/ repeat CT Dec 2023. Referred to [facility] and never went. Declined again today. No changes.   08/20/21 Dr Harrell Gave (Cardiology): f/u CAD; Orthostatics negative in office. D/C HCTZ. F/u 1 year.  06/13/21 NP Marland Kitchen (Pulmonology): f/u - rec overnight oximetry and 6-min walk test. Referred to [facility] rehab.  F/u 3 months.   05/20/21 PA Caron Presume (Cardiology): Rx HCTZ for mild edema. Consider d/c metoprolol (DOE). LDL 79 - increase rosuvastatin to 10 mg. Recheck lipids Aug.  02/28/21 Dr Harrell Gave (Cardiology): f/u CAD. S/p coronary CT w/ aortic atherosclerosis. Rx rosuvastatin 5 mg, aspirin 81 mg. Repeat lipids, LFT 2-3 months.  01/31/21 Dr Harrell Gave (cardiology): eval precordial pain. Ordered coronary CT. Rx'd metoprolol for procedure.  01/14/21 Dr Estanislado Pandy (Rheumatology): f/u temporal arteritis, Polymyalgia rheumatica. No changes.   Hospital visits: 10/16/21 ED visit ([facility]): fall in home - dizziness. Orthostasis vs vertigo. Rx'd meclizine.   Objective:  Lab Results  Component Value Date   CREATININE 1.12 (H) 10/16/2021   BUN 21 10/16/2021   GFR 41.29 (L) 08/25/2019   EGFR 56 (L) 01/31/2021   GFRNONAA 54 (L) 10/16/2021   GFRAA 50 (L) 06/13/2019   NA 141 10/16/2021   K 3.9 10/16/2021   CALCIUM 9.6 10/16/2021   CO2 27 10/16/2021   GLUCOSE 103 (H) 10/16/2021    Lab Results  Component Value Date/Time   HGBA1C 6.1 11/21/2020 09:39 AM   GFR 41.29 (L) 08/25/2019 08:15 AM   GFR 45.02 (L) 08/23/2018 08:38 AM    Last diabetic Eye exam: No results found for: "HMDIABEYEEXA"  Last diabetic Foot exam: No results found for: "HMDIABFOOTEX"   Lab Results  Component Value Date   CHOL  149 05/20/2021   HDL 60 05/20/2021   LDLCALC 79 05/20/2021   TRIG 46 05/20/2021   CHOLHDL 2.5 05/20/2021       Latest Ref Rng & Units 05/20/2021   11:54 AM 09/26/2020    7:41 PM 08/25/2019    8:15 AM  Hepatic Function  Total Protein 6.0 - 8.5 g/dL 6.8  7.3  7.3   Albumin 3.8 - 4.8 g/dL 4.3  4.0  4.2   AST 0 - 40 IU/L _0 ALT 0 - 32 IU/L _1 Alk Phosphatase 44 - 121 IU/L 92  90  100   Total Bilirubin 0.0 - 1.2 mg/dL 0.4  1.0  0.4   Bilirubin, Direct 0.00 - 0.40 mg/dL 0.14       Lab Results  Component Value Date/Time   TSH 1.53 11/21/2020 09:39 AM   TSH 1.08 11/20/2019 08:18 AM   FREET4 1.01 06/28/2017 04:38 PM   FREET4 1.41 03/30/2011 08:44 AM       Latest Ref Rng & Units 10/16/2021    9:15 AM 09/26/2020    7:41 PM 05/24/2020    2:10 PM  CBC  WBC 4.0 - 10.5 K/uL 6.9  6.6  6.7   Hemoglobin 12.0 - 15.0 g/dL 12.1  13.8  13.1   Hematocrit 36.0 - 46.0 % 37.6  41.6  40.5   Platelets 150 - 400 K/uL 181  214  203     Lab Results  Component Value Date/Time   VD25OH 68.99 11/21/2020 09:39 AM   VD25OH 29.76 (L) 11/20/2019 08:18 AM    Clinical ASCVD: Yes  The 10-year ASCVD risk score (Arnett DK, et al., 2019) is: 31.8%   Values used to calculate the score:     Age: 73 years     Sex: Female     Is Non-Hispanic African American: No     Diabetic: No     Tobacco smoker: Yes     Systolic Blood Pressure: 741 mmHg     Is BP treated: Yes     HDL Cholesterol: 60 mg/dL     Total Cholesterol: 149 mg/dL       07/17/2021    2:06 PM 11/25/2020   10:16 AM 11/22/2019   12:18 PM  Depression screen PHQ 2/9  Decreased Interest 0 0 0  Down, Depressed, Hopeless 0 0 0  PHQ - 2 Score 0 0 0  Altered sleeping 0 0 0  Tired, decreased energy 3 0 0  Change in appetite 0 0 0  Feeling bad or failure about yourself  0 0 0  Trouble concentrating 0 0 0  Moving slowly or fidgety/restless 0 0 0  Suicidal thoughts 0 0 0  PHQ-9 Score 3 0 0  Difficult doing work/chores Not  difficult at all Not difficult at all Not difficult at all     Social History   Tobacco Use  Smoking Status Former   Packs/day: 0.33   Years: 34.00   Total pack years: 11.22   Types: Cigarettes   Quit date: 10/24/2017   Years since quitting: 3.9  Passive exposure: Past (dad smoked)  Smokeless Tobacco Never  Tobacco Comments   quit smoking october 2019   BP Readings from Last 3 Encounters:  10/16/21 (!) 196/123  09/15/21 (!) 130/90  08/20/21 137/87   Pulse Readings from Last 3 Encounters:  10/16/21 94  09/15/21 81  08/20/21 76   Wt Readings from Last 3 Encounters:  09/15/21 170 lb (77.1 kg)  08/20/21 170 lb 14.4 oz (77.5 kg)  07/17/21 168 lb (76.2 kg)   BMI Readings from Last 3 Encounters:  09/15/21 27.03 kg/m  08/20/21 27.58 kg/m  07/17/21 27.12 kg/m    Assessment/Interventions: Review of patient past medical history, allergies, medications, health status, including review of consultants reports, laboratory and other test data, was performed as part of comprehensive evaluation and provision of chronic care management services.   SDOH:  (Social Determinants of Health) assessments and interventions performed: No SDOH Interventions    Flowsheet Row Chronic Care Management from 11/04/2020 in [place] at [place] from 11/22/2019 in [place] at [place] Interventions    Depression Interventions/Treatment  -- LNL8-9 Score <4 Follow-up Not Indicated  Financial Strain Interventions Intervention Not Indicated  [Patient receives [place] through assistance program (A,Z, and Me)] --      SDOH Screenings   Food Insecurity: No Food Insecurity (11/22/2019)  Housing: Low Risk  (11/22/2019)  Transportation Needs: No Transportation Needs (11/22/2019)  Alcohol Screen: Low Risk  (11/22/2019)  Depression (PHQ2-9): Low Risk  (07/17/2021)  Financial Resource Strain: Low Risk  (11/04/2020)  Physical Activity: Inactive  (11/22/2019)  Stress: No Stress Concern Present (11/22/2019)  Tobacco Use: Medium Risk (10/16/2021)    [place]  Allergies  Allergen Reactions   Sulfonamide Derivatives     As a child.  Throat closed, rash.   Alendronate Sodium     GI upset   Dilaudid [Hydromorphone Hcl] Nausea And Vomiting   Other Other (See Comments)    Adhesive    Wound Dressing Adhesive Other (See Comments)    Redness, adhesive tapes. Needs PAPER TAPE.    Medications Reviewed Today     Reviewed by Charlton Haws, [facility] (Pharmacist) on 10/22/21 at 58  Med List Status: <None>   Medication Order Taking? Sig Documenting Provider Last Dose Status Informant  acetaminophen (TYLENOL) 500 MG tablet [id number] Yes Take 1,000 mg by mouth every 6 (six) hours as needed for moderate pain or headache. [provider] Taking Active Self  albuterol (VENTOLIN HFA) 108 (90 Base) MCG/ACT inhaler [id number] Yes Inhale 2 puffs into the lungs every 6 (six) hours as needed for wheezing or shortness of breath. Okay to dispense proair/Ventolin/albuterol. Garner Nash, DO Taking Active   bacitracin ointment [id number] Yes Apply 1 Application topically 2 (two) times daily. Tretha Sciara, MD Taking Active   Budeson-Glycopyrrol-Formoterol (BREZTRI AEROSPHERE) 160-9-4.8 MCG/ACT Hollie Salk [id number] Yes Inhale 2 puffs into the lungs every 12 (twelve) hours. Garner Nash, DO Taking Active   buPROPion (WELLBUTRIN XL) 300 MG 24 hr tablet [id number] Yes TAKE 1 TABLET BY MOUTH  DAILY [name], MD Taking Active   Calcium 500 MG CHEW [id number] Yes Chew 500 mg by mouth 2 (two) times daily.  [provider] Taking Active Self  Cholecalciferol (DIALYVITE VITAMIN D 5000) 125 MCG (5000 UT) capsule [id number] Yes 1 tab a day except for 2 on Sundays [name], MD Taking Active Self           Med Note [place], [facility]  M   Tue Apr 22, 2021  2:53 PM) Take 2 on Saturday and sunday  ibandronate (BONIVA) 150 MG tablet  [id number] Yes Take 1 tablet (150 mg total) by mouth every 30 (thirty) days. Take in the morning with a full glass of water, on an empty stomach, and do not take anything else by mouth or lie down for the next 30 min. [name], MD Taking Active   levothyroxine (SYNTHROID) 125 MCG tablet [id number] Yes TAKE 1 TABLET BY MOUTH  DAILY EXCEPT TAKE 1 AND 1/2 TABLETS ON SUNDAY [name], MD Taking Active   meclizine (ANTIVERT) 25 MG tablet [id number] Yes Take 1 tablet (25 mg total) by mouth 3 (three) times daily as needed for dizziness. Tretha Sciara, MD Taking Active   metoprolol succinate (TOPROL-XL) 25 MG 24 hr tablet [id number] Yes TAKE 1 TABLET BY MOUTH  DAILY [name], MD Taking Active   Polyethyl Glycol-Propyl Glycol [facility]) [id number] Yes Place 1 drop into both eyes as needed. [provider] Taking Active Self  rosuvastatin (CRESTOR) 10 MG tablet [id number] Yes Take 1 tablet (10 mg total) by mouth daily. Buford Dresser, MD Taking Active   Spacer/Aero-Holding Chambers (AEROCHAMBER MV) inhaler [id number] Yes Use as instructed [name], NP Taking Active Self  tiZANidine (ZANAFLEX) 4 MG tablet [id number] Yes TAKE 1/2 TO 1 TABLET BY  MOUTH EVERY 6 HOURS AS  NEEDED FOR MUSCLE SPASMS [name], MD Taking Active   valsartan (DIOVAN) 40 MG tablet [id number] Yes TAKE 1 TABLET BY MOUTH  DAILY [name], MD Taking Active   vitamin C (ASCORBIC ACID) 250 MG tablet [id number] Yes Take 500 mg by mouth daily. [provider] Taking Active Self           Med Note Earna Coder Nov 25, 2020 10:20 AM)              Patient Active Problem List   Diagnosis Date Noted   Gait abnormality 07/20/2021   Chronic diastolic CHF (congestive heart failure) ([place]) 06/13/2021   Upper airway cough syndrome 05/07/2021   Osteoporosis 03/23/2021   Need for immunization against influenza 09/30/2020   Hospital discharge follow-up 09/30/2020    Healthcare maintenance 11/26/2019   Medication management 08/25/2019   Macromastia 05/31/2019   Allergic rhinitis 04/06/2019   Pain in left ankle and joints of left foot 03/30/2019   Medicare welcome exam 08/29/2018   Aortic atherosclerosis ([place]) 12/17/2017   Pulmonary nodule 12/17/2017   Muscle spasm 12/13/2017   GERD (gastroesophageal reflux disease) 12/12/2017   COPD, severe ([place]) 11/24/2017   Left-sided chest wall pain 11/24/2017   CKD (chronic kidney disease), stage III ([place]) 10/28/2017   Dizziness 10/27/2017   Headache 10/27/2017   Creatinine elevation 08/26/2017   SOB (shortness of breath) 08/26/2017   Tachycardia 06/29/2017   HTN (hypertension) 06/23/2017   Polymyalgia rheumatica ([place]) 05/22/2016   History of bilateral hip replacements 05/22/2016   DDD (degenerative disc disease), lumbar 05/22/2016   BPV (benign positional vertigo) 05/20/2016   Colon cancer screening 08/20/2015   Advance care planning 08/14/2014   Vitamin D deficiency 08/14/2014   Acute bronchitis with COPD ([place]) 06/04/2014   Avascular necrosis of bone of right hip ([place]) 10/06/2012   Medicare annual wellness visit, subsequent 04/07/2011   AVN (avascular necrosis of bone) ([place]) 05/16/2010   Asthma 03/23/2010   Osteoarthritis 03/23/2010   Hypothyroidism 03/21/2010   ANXIETY DEPRESSION 03/21/2010   Former smoker  03/21/2010   SKIN LESION 03/21/2010   TEMPORAL ARTERITIS 03/21/2010    Immunization History  Administered Date(s) Administered   Fluad Quad(high Dose 65+) 08/26/2018, 11/24/2019, 09/30/2020, 10/08/2021   Influenza Split 10/15/2010   Influenza,inj,Quad PF,6+ Mos 10/05/2012, 10/24/2013, 10/12/2014, 11/12/2015, 10/16/2016, 10/14/2017   Influenza-Unspecified 08/26/2018   PFIZER(Purple Top)SARS-COV-2 Vaccination 03/05/2019, 04/04/2019, 10/27/2019, 07/30/2020   Pneumococcal Conjugate-13 10/26/2018   Pneumococcal Polysaccharide-23 01/05/2009, 11/24/2019   Tdap 04/06/2011   Zoster Recombinat  (Shingrix) 09/05/2020    Conditions to be addressed/monitored:  Hypertension, Hyperlipidemia, COPD, Depression, Anxiety, and Osteoporosis  Care Plan : [place]  Updates made by Charlton Haws, [place] since 10/22/2021 12:00 AM     Problem: Hypertension, Hyperlipidemia, COPD, Depression, Anxiety, and Osteoporosis   Priority: High     Long-Range Goal: Disease Management   Start Date: 11/04/2020  Expected End Date: 04/30/2022  This Visit's Progress: On track  Recent Progress: On track  Priority: High  Note:   Current Barriers:  Due for lipid panel  Pharmacist Clinical Goal(s):  Patient will contact provider office for questions/concerns as evidenced notation of same in electronic health record through collaboration with PharmD and provider.   Interventions: 1:1 collaboration with [name], MD regarding development and update of comprehensive plan of care as evidenced by provider attestation and co-signature Inter-disciplinary care team collaboration (see longitudinal plan of care) Comprehensive medication review performed; medication list updated in electronic medical record  Hypertension (BP goal <140/90) -Query controlled - BP 120s/70s at home; recently in ED due to fall (dizziness, vertigo); pt has seen nurse in her ALF and they recommended PT/OT for balance, pt is willing  -Current treatment: Metoprolol succinate 25 mg daily - Appropriate, Effective, Safe, Accessible Valsartan 40 mg daily - Appropriate, Effective, Safe, Accessible -Patient has failed these meds in the past: amlodipine, HCTZ -Medications previously tried: n/a -Current dietary habits: limits caffeine/salt -Current exercise habits: not discussed today -Educated on BP goals and benefits of medications for prevention of heart attack, stroke and kidney damage; Proper BP monitoring technique -Counseled to monitor BP at home weekly AND if feeling dizzy -Recommended to continue current  medication;  Recommend PT/OT for balance - nurse is faxing order to PCP today  Hyperlipidemia: (LDL goal < 70) -Query controlled - LDL improved to 79 (05/2021) from 128 (11/2020) after starting rosuvastatin 5 mg- 39% reduction, dose was increased to 10 mg after 05/2021 labs -Pt does not want to be taking a statin, her husband had side effects but she is tolerating fine so far -s/p Coronary CT - aortic atherosclerosis -Current treatment: Rosuvastatin 10 mg daily - Appropriate, Query Effective Aspirin 81 mg daily - Appropriate, Effective, Safe, Accessible -Medications previously tried: n/a  -Educated on Cholesterol goals; Benefits of statin for ASCVD risk reduction; -Recommended to continue current medication; repeat lipids at upcoming CPE Nov 2023  COPD (Goal: control symptoms and prevent exacerbations) -Controlled - pt receiving Breztri through [place], she has had to update address multiple times, most recently CCM team has updated address 10/13 to her current one and refill is being shipper there now -Gold Grade: Gold 4 (FEV1<40%) -Pulmonary function testing: 12/2018 FEV1 34% predicted -Exacerbations requiring treatment in last 6 months: 1  -Current treatment  Breztri (PAP) - 2 puffs every 12 hours - Appropriate, Effective, Safe, Accessible Albuterol nebulizer PRN -Appropriate, Effective, Safe, Accessible Albuterol HFA PRN -Appropriate, Effective, Safe, Accessible Azithromycin 250 mg MWF - Appropriate, Effective, Safe, Accessible -Medications previously tried: Advair, [place] Caller -Patient reports consistent  use of maintenance inhaler -Frequency of rescue inhaler use: 3-5 times/week -Recommended to continue current medication  Osteoporosis (Goal: prevent fracture) -Controlled - pt started ibandronate 04/18/21, tolerating so far -Last DEXA Scan: 02/2021   T-Score lumbar spine: -2.7  T-Score forearm radius: -1.8 -Patient is a candidate for pharmacologic treatment due to T-Score < -2.5  in lumbar spine -Current treatment  Ibandronate 150 mg monthly - Appropriate, Query Effective Vitamin D 5000 IU daily - Appropriate, Effective, Safe, Accessible -Medications previously tried: Forteo x 18 months, Foasmax (GI effects)  -Recommend weight-bearing and muscle strengthening exercises for building and maintaining bone density. -Recommended to continue current medication  Depression/Anxiety (Goal: manage symptoms) -Not ideally controlled - pt reports improvement in mood on bupropion, but she is taking it at night and reports trouble sleeping/fatigue during the day -PHQ9: 0 (11/2020) - minimal depression -Follows with PCP for mental health support -Current treatment  Bupropion XL 300 mg daily PM - Query Appropriate, Effective, Safe, Accessible -Medications previously tried: alprazolam -Recommended to continue current medication  Vertigo (Goal: manage dizziness) -Controlled - pt reports meclizine has helped -Current treatment  Meclizine 25 mg daily - Appropriate, Effective, Safe, Accessible -Medications previously tried: n/a  -Recommended to continue current medication  Patient Goals/Self-Care Activities Patient will:  - take medications as prescribed as evidenced by patient report and record review focus on medication adherence by routine     Medication Assistance:  Breztri - [place] approved through 01/04/22  Compliance/Adherence/Medication fill history: Care Gaps: None  Star-Rating Drugs: Valsartan - PDC 100% Rosuvastatin - PDC 100%  Medication Access: Within the past 30 days, how often has patient missed a dose of medication? 0 Is a pillbox or other method used to improve adherence? Yes  Factors that may affect medication adherence? no barriers identified Are meds synced by current pharmacy? No  Are meds delivered by current pharmacy? Yes  Does patient experience delays in picking up medications due to transportation concerns? No   [facility]  Reviewed: Is patient disadvantaged to use [facility]?: No  Current Rx insurance plan: [facility] Name and location of Current pharmacy:  [facility] ([place], [facility] [address] [place] [address] [place] [address] Phone: [phone] Fax: [phone]  [facility] services reviewed with patient today?: No  Patient requests to transfer care to [facility]?: No  Reason patient declined to change pharmacies: Disadvantaged due to insurance/mail order  Care Plan and Follow Up Patient Decision:  Patient agrees to Care Plan and Follow-up.  Plan: Telephone follow up appointment with care management team member scheduled for:  3 months  [name], PharmD, BCACP Clinical Pharmacist [facility] [phone]

## 2021-10-28 DIAGNOSIS — G4730 Sleep apnea, unspecified: Secondary | ICD-10-CM | POA: Diagnosis not present

## 2021-10-28 DIAGNOSIS — R0683 Snoring: Secondary | ICD-10-CM | POA: Diagnosis not present

## 2021-10-31 ENCOUNTER — Encounter: Payer: Self-pay | Admitting: Gastroenterology

## 2021-11-05 ENCOUNTER — Telehealth: Payer: Self-pay | Admitting: Nurse Practitioner

## 2021-11-06 ENCOUNTER — Telehealth (HOSPITAL_COMMUNITY): Payer: Self-pay | Admitting: Family Medicine

## 2021-11-06 ENCOUNTER — Ambulatory Visit (AMBULATORY_SURGERY_CENTER): Payer: Self-pay

## 2021-11-06 ENCOUNTER — Other Ambulatory Visit: Payer: Self-pay

## 2021-11-06 VITALS — Ht 66.5 in | Wt 179.0 lb

## 2021-11-06 DIAGNOSIS — Z86010 Personal history of colonic polyps: Secondary | ICD-10-CM

## 2021-11-06 NOTE — Progress Notes (Signed)
Denies allergies to eggs or soy products. Denies complication of anesthesia or sedation. Denies use of weight loss medication. Denies use of O2.   Emmi instructions given for colonoscopy.  

## 2021-11-07 DIAGNOSIS — R2689 Other abnormalities of gait and mobility: Secondary | ICD-10-CM | POA: Diagnosis not present

## 2021-11-07 DIAGNOSIS — R296 Repeated falls: Secondary | ICD-10-CM | POA: Diagnosis not present

## 2021-11-07 NOTE — Telephone Encounter (Signed)
Called patient but she did not answer. Left message for her to call us back.  

## 2021-11-10 DIAGNOSIS — R2689 Other abnormalities of gait and mobility: Secondary | ICD-10-CM | POA: Diagnosis not present

## 2021-11-10 DIAGNOSIS — R296 Repeated falls: Secondary | ICD-10-CM | POA: Diagnosis not present

## 2021-11-10 DIAGNOSIS — R278 Other lack of coordination: Secondary | ICD-10-CM | POA: Diagnosis not present

## 2021-11-11 ENCOUNTER — Encounter: Payer: Self-pay | Admitting: Pulmonary Disease

## 2021-11-11 ENCOUNTER — Encounter: Payer: Self-pay | Admitting: Gastroenterology

## 2021-11-11 ENCOUNTER — Telehealth (HOSPITAL_COMMUNITY): Payer: Self-pay

## 2021-11-11 DIAGNOSIS — R2689 Other abnormalities of gait and mobility: Secondary | ICD-10-CM | POA: Diagnosis not present

## 2021-11-11 DIAGNOSIS — R296 Repeated falls: Secondary | ICD-10-CM | POA: Diagnosis not present

## 2021-11-11 NOTE — Telephone Encounter (Signed)
No response from pt.  Closed referral  

## 2021-11-12 ENCOUNTER — Encounter: Payer: Self-pay | Admitting: Gastroenterology

## 2021-11-12 DIAGNOSIS — R278 Other lack of coordination: Secondary | ICD-10-CM | POA: Diagnosis not present

## 2021-11-12 DIAGNOSIS — R296 Repeated falls: Secondary | ICD-10-CM | POA: Diagnosis not present

## 2021-11-12 NOTE — Telephone Encounter (Signed)
[facility] colonoscopy appt has been cancelled.

## 2021-11-13 ENCOUNTER — Encounter: Payer: Self-pay | Admitting: Gastroenterology

## 2021-11-13 DIAGNOSIS — R2689 Other abnormalities of gait and mobility: Secondary | ICD-10-CM | POA: Diagnosis not present

## 2021-11-13 DIAGNOSIS — R296 Repeated falls: Secondary | ICD-10-CM | POA: Diagnosis not present

## 2021-11-17 DIAGNOSIS — R2689 Other abnormalities of gait and mobility: Secondary | ICD-10-CM | POA: Diagnosis not present

## 2021-11-17 DIAGNOSIS — R296 Repeated falls: Secondary | ICD-10-CM | POA: Diagnosis not present

## 2021-11-18 ENCOUNTER — Ambulatory Visit: Payer: Medicare Other | Admitting: Rheumatology

## 2021-11-19 DIAGNOSIS — R278 Other lack of coordination: Secondary | ICD-10-CM | POA: Diagnosis not present

## 2021-11-19 DIAGNOSIS — R296 Repeated falls: Secondary | ICD-10-CM | POA: Diagnosis not present

## 2021-11-19 DIAGNOSIS — R2689 Other abnormalities of gait and mobility: Secondary | ICD-10-CM | POA: Diagnosis not present

## 2021-11-20 ENCOUNTER — Encounter: Payer: Medicare Other | Admitting: Gastroenterology

## 2021-11-20 DIAGNOSIS — R2689 Other abnormalities of gait and mobility: Secondary | ICD-10-CM | POA: Diagnosis not present

## 2021-11-20 DIAGNOSIS — R296 Repeated falls: Secondary | ICD-10-CM | POA: Diagnosis not present

## 2021-11-23 ENCOUNTER — Other Ambulatory Visit: Payer: Self-pay | Admitting: Family Medicine

## 2021-11-23 DIAGNOSIS — M810 Age-related osteoporosis without current pathological fracture: Secondary | ICD-10-CM

## 2021-11-23 DIAGNOSIS — R739 Hyperglycemia, unspecified: Secondary | ICD-10-CM

## 2021-11-23 DIAGNOSIS — I10 Essential (primary) hypertension: Secondary | ICD-10-CM

## 2021-11-24 ENCOUNTER — Telehealth: Payer: Self-pay

## 2021-11-24 ENCOUNTER — Other Ambulatory Visit (INDEPENDENT_AMBULATORY_CARE_PROVIDER_SITE_OTHER): Payer: Medicare Other

## 2021-11-24 DIAGNOSIS — R739 Hyperglycemia, unspecified: Secondary | ICD-10-CM | POA: Diagnosis not present

## 2021-11-24 DIAGNOSIS — M810 Age-related osteoporosis without current pathological fracture: Secondary | ICD-10-CM | POA: Diagnosis not present

## 2021-11-24 DIAGNOSIS — I10 Essential (primary) hypertension: Secondary | ICD-10-CM

## 2021-11-24 LAB — LIPID PANEL
Cholesterol: 130 mg/dL (ref 0–200)
HDL: 54 mg/dL (ref >39.00)
LDL Cholesterol: 67 mg/dL (ref 0–99)
NonHDL: 76.18
Total CHOL/HDL Ratio: 2
Triglycerides: 46 mg/dL (ref 0.0–149.0)
VLDL: 9.2 mg/dL (ref 0.0–40.0)

## 2021-11-24 LAB — HEPATIC FUNCTION PANEL
ALT: 10 U/L (ref 0–35)
AST: 14 U/L (ref 0–37)
Albumin: 4.2 g/dL (ref 3.5–5.2)
Alkaline Phosphatase: 78 U/L (ref 39–117)
Bilirubin, Direct: 0.1 mg/dL (ref 0.0–0.3)
Total Bilirubin: 0.4 mg/dL (ref 0.2–1.2)
Total Protein: 6.8 g/dL (ref 6.0–8.3)

## 2021-11-24 LAB — TSH: TSH: 0.03 u[IU]/mL — ABNORMAL LOW (ref 0.35–5.50)

## 2021-11-24 LAB — VITAMIN D 25 HYDROXY (VIT D DEFICIENCY, FRACTURES): VITD: 118.56 ng/mL (ref 30.00–100.00)

## 2021-11-24 LAB — HEMOGLOBIN A1C: Hgb A1c MFr Bld: 6.1 % (ref 4.6–6.5)

## 2021-11-24 NOTE — Telephone Encounter (Signed)
Please call pt.  Vit D elevated.  Not emergent, will d/w pt at [facility] as scheduled but please stop any extra vitamin D in the meantime.  Thanks.

## 2021-11-24 NOTE — Telephone Encounter (Signed)
Kenney Houseman with [place] lab called critical report on Vit D 118.56; sending to Dr Randa Ngo pool and teams [place] CMA.will record in lab critical lab notebook.

## 2021-11-24 NOTE — Telephone Encounter (Signed)
Patient aware of results and advised to stop any extra Vit D.

## 2021-11-25 DIAGNOSIS — R296 Repeated falls: Secondary | ICD-10-CM | POA: Diagnosis not present

## 2021-11-25 DIAGNOSIS — R2689 Other abnormalities of gait and mobility: Secondary | ICD-10-CM | POA: Diagnosis not present

## 2021-11-25 DIAGNOSIS — R278 Other lack of coordination: Secondary | ICD-10-CM | POA: Diagnosis not present

## 2021-11-26 ENCOUNTER — Ambulatory Visit (INDEPENDENT_AMBULATORY_CARE_PROVIDER_SITE_OTHER): Payer: Medicare Other

## 2021-11-26 VITALS — Ht 66.5 in | Wt 170.0 lb

## 2021-11-26 DIAGNOSIS — Z0000 Encounter for general adult medical examination without abnormal findings: Secondary | ICD-10-CM | POA: Diagnosis not present

## 2021-11-26 DIAGNOSIS — R2689 Other abnormalities of gait and mobility: Secondary | ICD-10-CM | POA: Diagnosis not present

## 2021-11-26 DIAGNOSIS — R278 Other lack of coordination: Secondary | ICD-10-CM | POA: Diagnosis not present

## 2021-11-26 DIAGNOSIS — R296 Repeated falls: Secondary | ICD-10-CM | POA: Diagnosis not present
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
## 2021-11-26 NOTE — Patient Instructions (Signed)
April [name] , Thank you for taking time to come for your Medicare Wellness Visit. I appreciate your ongoing commitment to your health goals. Please review the following plan we discussed and let me know if I can assist you in the future.   Screening recommendations/referrals: Colonoscopy: 12/04/20-, will call and make an appointment Mammogram: 06/17/21 referral sent (good for one year) Bone Density: 02/25/21 Recommended yearly ophthalmology/optometry visit for glaucoma screening and checkup Recommended yearly dental visit for hygiene and checkup  Vaccinations: Influenza vaccine: 10/08/21 Pneumococcal vaccine: 11/24/19 Tdap vaccine: 04/06/11, due if have injury Shingles vaccine: Shingrix 09/05/20, need date for 2nd shot   Covid-19:03/05/19, 04/04/19, 10/27/19, 07/30/20  Advanced directives: yes  Conditions/risks identified: none  Next appointment: Follow up in one year for your annual wellness visit 11/30/22 @ 11 am by phone   Preventive Care 65 Years and Older, Female Preventive care refers to lifestyle choices and visits with your health care provider that can promote health and wellness. What does preventive care include? A yearly physical exam. This is also called an annual well check. Dental exams once or twice a year. Routine eye exams. Ask your health care provider how often you should have your eyes checked. Personal lifestyle choices, including: Daily care of your teeth and gums. Regular physical activity. Eating a healthy diet. Avoiding tobacco and drug use. Limiting alcohol use. Practicing safe sex. Taking low-dose aspirin every day. Taking vitamin and mineral supplements as recommended by your health care provider. What happens during an annual well check? The services and screenings done by your health care provider during your annual well check will depend on your age, overall health, lifestyle risk factors, and family history of disease. Counseling  Your health care  provider may ask you questions about your: Alcohol use. Tobacco use. Drug use. Emotional well-being. Home and relationship well-being. Sexual activity. Eating habits. History of falls. Memory and ability to understand (cognition). Work and work Statistician. Reproductive health. Screening  You may have the following tests or measurements: Height, weight, and BMI. Blood pressure. Lipid and cholesterol levels. These may be checked every 5 years, or more frequently if you are over 62 years old. Skin check. Lung cancer screening. You may have this screening every year starting at age 25 if you have a 30-pack-year history of smoking and currently smoke or have quit within the past 15 years. Fecal occult blood test (FOBT) of the stool. You may have this test every year starting at age 28. Flexible sigmoidoscopy or colonoscopy. You may have a sigmoidoscopy every 5 years or a colonoscopy every 10 years starting at age 86. Hepatitis C blood test. Hepatitis B blood test. Sexually transmitted disease (STD) testing. Diabetes screening. This is done by checking your blood sugar (glucose) after you have not eaten for a while (fasting). You may have this done every 1-3 years. Bone density scan. This is done to screen for osteoporosis. You may have this done starting at age 33. Mammogram. This may be done every 1-2 years. Talk to your health care provider about how often you should have regular mammograms. Talk with your health care provider about your test results, treatment options, and if necessary, the need for more tests. Vaccines  Your health care provider may recommend certain vaccines, such as: Influenza vaccine. This is recommended every year. Tetanus, diphtheria, and acellular pertussis (Tdap, Td) vaccine. You may need a Td booster every 10 years. Zoster vaccine. You may need this after age 28. Pneumococcal 13-valent conjugate (PCV13) vaccine.  One dose is recommended after age  54. Pneumococcal polysaccharide (PPSV23) vaccine. One dose is recommended after age 80. Talk to your health care provider about which screenings and vaccines you need and how often you need them. This information is not intended to replace advice given to you by your health care provider. Make sure you discuss any questions you have with your health care provider. Document Released: 01/18/2015 Document Revised: 09/11/2015 Document Reviewed: 10/23/2014 Elsevier Interactive Patient Education  2017 [place] Prevention in the Home Falls can cause injuries. They can happen to people of all ages. There are many things you can do to make your home safe and to help prevent falls. What can I do on the outside of my home? Regularly fix the edges of walkways and driveways and fix any cracks. Remove anything that might make you trip as you walk through a door, such as a raised step or threshold. Trim any bushes or trees on the path to your home. Use bright outdoor lighting. Clear any walking paths of anything that might make someone trip, such as rocks or tools. Regularly check to see if handrails are loose or broken. Make sure that both sides of any steps have handrails. Any raised decks and porches should have guardrails on the edges. Have any leaves, snow, or ice cleared regularly. Use sand or salt on walking paths during winter. Clean up any spills in your garage right away. This includes oil or grease spills. What can I do in the bathroom? Use night lights. Install grab bars by the toilet and in the tub and shower. Do not use towel bars as grab bars. Use non-skid mats or decals in the tub or shower. If you need to sit down in the shower, use a plastic, non-slip stool. Keep the floor dry. Clean up any water that spills on the floor as soon as it happens. Remove soap buildup in the tub or shower regularly. Attach bath mats securely with double-sided non-slip rug tape. Do not have throw  rugs and other things on the floor that can make you trip. What can I do in the bedroom? Use night lights. Make sure that you have a light by your bed that is easy to reach. Do not use any sheets or blankets that are too big for your bed. They should not hang down onto the floor. Have a firm chair that has side arms. You can use this for support while you get dressed. Do not have throw rugs and other things on the floor that can make you trip. What can I do in the kitchen? Clean up any spills right away. Avoid walking on wet floors. Keep items that you use a lot in easy-to-reach places. If you need to reach something above you, use a strong step stool that has a grab bar. Keep electrical cords out of the way. Do not use floor polish or wax that makes floors slippery. If you must use wax, use non-skid floor wax. Do not have throw rugs and other things on the floor that can make you trip. What can I do with my stairs? Do not leave any items on the stairs. Make sure that there are handrails on both sides of the stairs and use them. Fix handrails that are broken or loose. Make sure that handrails are as long as the stairways. Check any carpeting to make sure that it is firmly attached to the stairs. Fix any carpet that is loose or  worn. Avoid having throw rugs at the top or bottom of the stairs. If you do have throw rugs, attach them to the floor with carpet tape. Make sure that you have a light switch at the top of the stairs and the bottom of the stairs. If you do not have them, ask someone to add them for you. What else can I do to help prevent falls? Wear shoes that: Do not have high heels. Have rubber bottoms. Are comfortable and fit you well. Are closed at the toe. Do not wear sandals. If you use a stepladder: Make sure that it is fully opened. Do not climb a closed stepladder. Make sure that both sides of the stepladder are locked into place. Ask someone to hold it for you, if  possible. Clearly mark and make sure that you can see: Any grab bars or handrails. First and last steps. Where the edge of each step is. Use tools that help you move around (mobility aids) if they are needed. These include: Canes. Walkers. Scooters. Crutches. Turn on the lights when you go into a dark area. Replace any light bulbs as soon as they burn out. Set up your furniture so you have a clear path. Avoid moving your furniture around. If any of your floors are uneven, fix them. If there are any pets around you, be aware of where they are. Review your medicines with your doctor. Some medicines can make you feel dizzy. This can increase your chance of falling. Ask your doctor what other things that you can do to help prevent falls. This information is not intended to replace advice given to you by your health care provider. Make sure you discuss any questions you have with your health care provider. Document Released: 10/18/2008 Document Revised: 05/30/2015 Document Reviewed: 01/26/2014 Elsevier Interactive Patient Education  2017 [employer].

## 2021-12-01 ENCOUNTER — Encounter: Payer: Self-pay | Admitting: Family Medicine

## 2021-12-01 ENCOUNTER — Ambulatory Visit (INDEPENDENT_AMBULATORY_CARE_PROVIDER_SITE_OTHER): Payer: Medicare Other | Admitting: Family Medicine

## 2021-12-01 VITALS — BP 104/68 | HR 101 | Temp 97.3°F | Ht 66.5 in | Wt 166.0 lb

## 2021-12-01 DIAGNOSIS — E785 Hyperlipidemia, unspecified: Secondary | ICD-10-CM

## 2021-12-01 DIAGNOSIS — I10 Essential (primary) hypertension: Secondary | ICD-10-CM | POA: Diagnosis not present

## 2021-12-01 DIAGNOSIS — E039 Hypothyroidism, unspecified: Secondary | ICD-10-CM | POA: Diagnosis not present

## 2021-12-01 DIAGNOSIS — J449 Chronic obstructive pulmonary disease, unspecified: Secondary | ICD-10-CM

## 2021-12-01 DIAGNOSIS — M810 Age-related osteoporosis without current pathological fracture: Secondary | ICD-10-CM | POA: Diagnosis not present

## 2021-12-01 DIAGNOSIS — Z0000 Encounter for general adult medical examination without abnormal findings: Secondary | ICD-10-CM

## 2021-12-01 DIAGNOSIS — Z7189 Other specified counseling: Secondary | ICD-10-CM

## 2021-12-01 DIAGNOSIS — R278 Other lack of coordination: Secondary | ICD-10-CM | POA: Diagnosis not present

## 2021-12-01 DIAGNOSIS — R296 Repeated falls: Secondary | ICD-10-CM | POA: Diagnosis not present

## 2021-12-01 DIAGNOSIS — F341 Dysthymic disorder: Secondary | ICD-10-CM

## 2021-12-01 MED ORDER — LEVOTHYROXINE SODIUM 125 MCG PO TABS: ORAL_TABLET | ORAL | Status: DC

## 2021-12-01 NOTE — Patient Instructions (Addendum)
Stop vitamin D and recheck labs in about 3 months.   Take care.  Glad to see you. I'll check on the clinic closer to home.  I would get the covid booster then the RSV vaccine later.

## 2021-12-01 NOTE — Progress Notes (Unsigned)
Hypothyroidism.  TSH low.  Had been taking 156mg/tab, 8 tabs per week.  Recently cut back to 7 tabs a week, 1 a day.  D/w pt about dosing and f/u TSH in about 3 months.    COPD.  Recurrently hoarse.  Using inhalers at baseline.  She is rinsing after use.  Compliant.   Elevated Cholesterol: Using medications without problems: yes Muscle aches: occ cramping but likely not from statin.   Diet compliance: d/w pt.   Exercise: d/w pt.    Hypertension:    Using medication without problems or lightheadedness:  yes Chest pain with exertion: some occ with exertion, not worse.  Prev cards eval noted.   Edema: some BLE edema at baseline, with compression stockings used daily.   Short of breath: improved with inhaler use.    D/w pt about prev CT imaging:  CAD-RADS 1. Minimal non-obstructive CAD (0-24%). Consider non-atherosclerotic causes of chest pain. Consider preventive therapy and risk factor modification.  Osteoporosis.  Had elevated vit D, d/w pt.  She is more active.  Still on ibandronate at baseline w/o ADE on med.  Plan is/was to take ibandronate through spring 2025 and the recheck DXA.  Dxa 2023 d/w pt.   She didn't tolerate fosamax.   She prev took forteo for 18 months.   She has not been on prolia.    Mood d/w pt.  Doing well, compliant with wellbutrin.  No SI/HI.    Flu 2023 Shingles prev done.   PNA 2021 Tetanus 2013 covid 2022 Colonoscopy 2022, d/w pt about GI follow up.  Breast cancer screening 2023 Bone density test 2023 Advance directive-Kim Seaver designated first then JGery Prayif needed, if patient were incapacitated.  Balance d/w pt.  In PT and OT.  Using walker at baseline.  H/o L ankle instability at baseline. She is compliant with exercises with some improvement.  Would continue using walker and continue her exercises.  She needs to get set up with a doctor closer to home.  I told her I will check with my colleagues.  Meds, vitals, and allergies reviewed.    PMH and SH reviewed  ROS: Per HPI unless specifically indicated in ROS section   GEN: nad, alert and oriented HEENT: ncat NECK: supple w/o LA CV: rrr. PULM: ctab, no inc wob ABD: soft, +bs EXT: no edema SKIN: no acute rash

## 2021-12-02 DIAGNOSIS — R296 Repeated falls: Secondary | ICD-10-CM | POA: Diagnosis not present

## 2021-12-02 DIAGNOSIS — R2689 Other abnormalities of gait and mobility: Secondary | ICD-10-CM | POA: Diagnosis not present

## 2021-12-03 ENCOUNTER — Telehealth: Payer: Self-pay | Admitting: Family Medicine

## 2021-12-03 DIAGNOSIS — R2689 Other abnormalities of gait and mobility: Secondary | ICD-10-CM | POA: Diagnosis not present

## 2021-12-03 DIAGNOSIS — E785 Hyperlipidemia, unspecified: Secondary | ICD-10-CM | POA: Insufficient documentation

## 2021-12-03 DIAGNOSIS — E782 Mixed hyperlipidemia: Secondary | ICD-10-CM | POA: Insufficient documentation

## 2021-12-03 DIAGNOSIS — R296 Repeated falls: Secondary | ICD-10-CM | POA: Diagnosis not present

## 2021-12-03 DIAGNOSIS — R278 Other lack of coordination: Secondary | ICD-10-CM | POA: Diagnosis not present

## 2021-12-03 NOTE — Assessment & Plan Note (Signed)
Had elevated vit D, d/w pt.  She is more active.  Still on ibandronate at baseline w/o ADE on med.  Plan is/was to take ibandronate through spring 2025 and the recheck DXA.  Would temporarily stop vitamin D for now and recheck labs in 3 months.

## 2021-12-03 NOTE — Assessment & Plan Note (Signed)
Continue metoprolol and valsartan 

## 2021-12-03 NOTE — Assessment & Plan Note (Signed)
Improved with Wellbutrin.  Continue as is.

## 2021-12-03 NOTE — Assessment & Plan Note (Signed)
With recurrent hoarseness and I am going to ask for input from Dr. Valeta Harms.

## 2021-12-03 NOTE — Assessment & Plan Note (Signed)
Had been taking 164mg/tab, 8 tabs per week.  Recently cut back to 7 tabs a week, 1 a day.  D/w pt about dosing and f/u TSH in about 3 months.

## 2021-12-03 NOTE — Assessment & Plan Note (Signed)
Continue Crestor and continue exercising as tolerated.

## 2021-12-03 NOTE — Telephone Encounter (Signed)
This patient is recurrently hoarse and I question if it is related to her inhalers.  I need your guidance here.  I greatly appreciate your help.

## 2021-12-03 NOTE — Assessment & Plan Note (Signed)
Flu 2023 Shingles prev done.   PNA 2021 Tetanus 2013 covid 2022 Colonoscopy 2022, d/w pt about GI follow up.  Breast cancer screening 2023 Bone density test 2023 Advance directive-Kim Seaver designated first then Gery Pray if needed, if patient were incapacitated.

## 2021-12-03 NOTE — Assessment & Plan Note (Signed)
Advance directive-Kim Seaver designated first then [employer] if needed, if patient were incapacitated.

## 2021-12-05 MED ORDER — OMEPRAZOLE 20 MG PO CPDR: 20.0000 mg | DELAYED_RELEASE_CAPSULE | Freq: Every day | ORAL | 1 refills | Status: DC

## 2021-12-05 NOTE — Telephone Encounter (Signed)
Please update patient.  I checked with Dr. Valeta Harms.  While it is possible her inhalers could be causing her hoarseness, it would probably be more likely to be GERD or silent GERD related.  I would try taking omeprazole for 2 weeks (she may need to take it longer) and if not better we likely need to get her seen by ENT.  If she does not improve then we can put in a referral.  I sent the prescription for omeprazole.  It may be cheaper over-the-counter.  Either way, please have her update [place] about her voice in about 2 weeks.  Thanks.

## 2021-12-05 NOTE — Addendum Note (Signed)
Addended by: [name] on: 12/05/2021 06:24 AM   Modules accepted: Orders

## 2021-12-05 NOTE — Telephone Encounter (Signed)
Called and spoke with patient about rx being sent and discussed possible ENT referral. Patient will call back 2 weeks after starting rx with update.

## 2021-12-08 DIAGNOSIS — R2689 Other abnormalities of gait and mobility: Secondary | ICD-10-CM | POA: Diagnosis not present

## 2021-12-08 DIAGNOSIS — R296 Repeated falls: Secondary | ICD-10-CM | POA: Diagnosis not present

## 2021-12-08 DIAGNOSIS — R278 Other lack of coordination: Secondary | ICD-10-CM | POA: Diagnosis not present

## 2021-12-10 DIAGNOSIS — R278 Other lack of coordination: Secondary | ICD-10-CM | POA: Diagnosis not present

## 2021-12-10 DIAGNOSIS — R296 Repeated falls: Secondary | ICD-10-CM | POA: Diagnosis not present

## 2021-12-10 DIAGNOSIS — R2689 Other abnormalities of gait and mobility: Secondary | ICD-10-CM | POA: Diagnosis not present

## 2021-12-11 ENCOUNTER — Ambulatory Visit
Admission: RE | Admit: 2021-12-11 | Discharge: 2021-12-11 | Disposition: A | Discharge status: Home / Self Care | Payer: Medicare Other | Source: Ambulatory Visit | Attending: Acute Care | Admitting: Acute Care

## 2021-12-11 DIAGNOSIS — Z87891 Personal history of nicotine dependence: Secondary | ICD-10-CM

## 2021-12-12 ENCOUNTER — Other Ambulatory Visit: Payer: Medicare Other

## 2021-12-15 ENCOUNTER — Other Ambulatory Visit: Payer: Self-pay | Admitting: Acute Care

## 2021-12-15 DIAGNOSIS — R278 Other lack of coordination: Secondary | ICD-10-CM | POA: Diagnosis not present

## 2021-12-15 DIAGNOSIS — R296 Repeated falls: Secondary | ICD-10-CM | POA: Diagnosis not present

## 2021-12-15 DIAGNOSIS — Z87891 Personal history of nicotine dependence: Secondary | ICD-10-CM

## 2021-12-15 DIAGNOSIS — Z122 Encounter for screening for malignant neoplasm of respiratory organs: Secondary | ICD-10-CM

## 2021-12-17 ENCOUNTER — Telehealth: Payer: Self-pay | Admitting: Family Medicine

## 2021-12-17 DIAGNOSIS — R296 Repeated falls: Secondary | ICD-10-CM | POA: Diagnosis not present

## 2021-12-17 DIAGNOSIS — R269 Unspecified abnormalities of gait and mobility: Secondary | ICD-10-CM

## 2021-12-17 DIAGNOSIS — R2689 Other abnormalities of gait and mobility: Secondary | ICD-10-CM | POA: Diagnosis not present

## 2021-12-17 NOTE — Telephone Encounter (Signed)
Patient called in and was wanting to know if a referral for a neurologist could be sent in for her. She stated that she is continuing to fall and her therapist recommended she see one. Please advise. Thank you!

## 2021-12-19 DIAGNOSIS — R296 Repeated falls: Secondary | ICD-10-CM | POA: Diagnosis not present

## 2021-12-19 DIAGNOSIS — R278 Other lack of coordination: Secondary | ICD-10-CM | POA: Diagnosis not present

## 2021-12-19 DIAGNOSIS — R2689 Other abnormalities of gait and mobility: Secondary | ICD-10-CM | POA: Diagnosis not present

## 2021-12-19 NOTE — Addendum Note (Signed)
Addended by: [name] on: 12/19/2021 08:03 AM   Modules accepted: Orders

## 2021-12-19 NOTE — Telephone Encounter (Signed)
See referral message for further explanation and request.   [facility] is denying the referral due to not having supporting clinical notes for the referral.  The diagnosis is attached to the referral but they require OV notes for this referral.

## 2021-12-19 NOTE — Telephone Encounter (Signed)
Done. Thanks.

## 2021-12-19 NOTE — Telephone Encounter (Signed)
Patient called back in and stated that she tried to get schedule with the neurologist. They are needing office notes and a diagnosis code to get her scheduled. She stated she asked for the referral because her physical therapist recommended it.

## 2021-12-19 NOTE — Telephone Encounter (Signed)
Referral is in with dx code.  Please send to referral coordinator pool.  Thanks.

## 2021-12-21 NOTE — Telephone Encounter (Signed)
See message on referral note.

## 2021-12-22 DIAGNOSIS — R2689 Other abnormalities of gait and mobility: Secondary | ICD-10-CM | POA: Diagnosis not present

## 2021-12-22 DIAGNOSIS — R278 Other lack of coordination: Secondary | ICD-10-CM | POA: Diagnosis not present

## 2021-12-22 DIAGNOSIS — R296 Repeated falls: Secondary | ICD-10-CM | POA: Diagnosis not present

## 2021-12-22 NOTE — Telephone Encounter (Signed)
Office has called and left message for the patient

## 2021-12-23 DIAGNOSIS — R296 Repeated falls: Secondary | ICD-10-CM | POA: Diagnosis not present

## 2021-12-23 DIAGNOSIS — R2689 Other abnormalities of gait and mobility: Secondary | ICD-10-CM | POA: Diagnosis not present
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
## 2021-12-24 DIAGNOSIS — R296 Repeated falls: Secondary | ICD-10-CM | POA: Diagnosis not present

## 2021-12-24 DIAGNOSIS — R278 Other lack of coordination: Secondary | ICD-10-CM | POA: Diagnosis not present

## 2021-12-25 ENCOUNTER — Encounter: Payer: Self-pay | Admitting: Neurology

## 2021-12-25 ENCOUNTER — Other Ambulatory Visit (INDEPENDENT_AMBULATORY_CARE_PROVIDER_SITE_OTHER): Payer: Medicare Other

## 2021-12-25 ENCOUNTER — Ambulatory Visit: Payer: Medicare Other | Admitting: Neurology

## 2021-12-25 VITALS — BP 131/80 | HR 84 | Ht 66.5 in | Wt 170.8 lb

## 2021-12-25 DIAGNOSIS — R296 Repeated falls: Secondary | ICD-10-CM

## 2021-12-25 DIAGNOSIS — R269 Unspecified abnormalities of gait and mobility: Secondary | ICD-10-CM

## 2021-12-25 DIAGNOSIS — R2681 Unsteadiness on feet: Secondary | ICD-10-CM

## 2021-12-25 DIAGNOSIS — M6289 Other specified disorders of muscle: Secondary | ICD-10-CM

## 2021-12-25 LAB — VITAMIN B12: Vitamin B-12: 247 pg/mL (ref 211–911)

## 2021-12-25 NOTE — Patient Instructions (Signed)
I saw you today for unstable walking, lightheadedness, and falls.  This could be related to a vitamin problem or spinal cord problem at your neck. I also notice you had lightheadedness when you stand. This is called orthostatic intolerance. I have recommendations for this below.  I would like to investigate further with: -Blood work today -MRI of your neck (cervical spine)  I will be in touch when I have your results.  You should continue physical therapy.  Stay safe, use walker, and do everything to prevent further falls (see fall precautions below).  I would like to see you back in clinic in 3 months. Please let me know if you have any questions or concerns in the meantime.   The physicians and staff at [facility] are committed to providing excellent care. You may receive a survey requesting feedback about your experience at our office. We strive to receive "very good" responses to the survey questions. If you feel that your experience would prevent you from giving the office a "very good " response, please contact our office to try to remedy the situation. We may be reached at [phone]. Thank you for taking the time out of your busy day to complete the survey.  Kai Levins, MD [facility]  Guidelines for the Nonpharmacological Treatment of Orthostatic Intolerance (lightheadedness when standing)  Make all postural changes from lying to sitting or sitting to standing, slowly.  Drink 2.0-2.5 L of fluid per day (if okay with your other doctors).  Increase sodium in the diet to 3-5 g per day (if okay with your other doctors).  Avoid large meals which can cause low blood pressure during digestion. It is better to eat smaller meals more often than 3 large meals.  Avoid alcohol. Alcohol can cause blood to pool in the legs which may worsen low blood pressure reactions when standing.  Perform lower extremity exercises to improve strength of the leg muscles. This will help  prevent blood from pooling in the legs when standing and walking. Raise the head of the bed by 6-10 inches. The entire bed must be at an angle. Raising only the head portion of the bed at the waist level or using pillows will not be effective. Raising the head of the bed will reduce urine formation overnight and there will be more volume in the circulation in the morning. It may also help orthostatic tolerance during the day.  During bad days or prior to engaging in more physical activity than usual, drink 500 ml of water quickly. This will result in increased blood pressure within 5 minutes of drinking the water. The effect will last up to a few hours and may improve orthostatic intolerance.  Use custom-fitted elastic support stockings. This will reduce a tendency for blood to pool in the legs when standing and may improve orthostatic intolerance. Abdominal binder or "SPANX" may also be useful.  Use physical counter-maneuvers such as leg crossing, squatting, or raising and resting the leg on a chair. These maneuvers increase blood pressure and can improve orthostatic intolerance.  Gently escalated aerobic physical activity program for graded reconditioning (see details below)  Home-based Exercise Plan for Patients with Orthostatic Intolerance (as may be seen in POTS and related disorders)  Level 1 - Reclined Gentle Movements This is the starting point for those patients most severely disabled by an autonomic disorder. If you are bedridden, this is your first step. We have known dysautonomia patients who were bedridden for years, and we able to  work their way from "barely able to move" to biking 45 minutes a day, everyday. It will be hard. It may make you feel worse in the beginning. But the human body was meant to move. Just take baby steps until you can make it to your goal. Some patients can only do one minute a day, or one minute at a time, a few times a day. And they do this every day for a week,  and then they increase to two minutes a day the next week. This is a very slow process, but improving your health slowly is better than not improving it at all. Helpful exercises may include:  Leg Pillow Squeeze - while laying down or reclined in bed, put a pillow folded between your knees and squeeze. Hold it for 10 seconds. Repeat.  Arm Pillow Squeeze - put the pillow folded between your palms and squeeze together as though you were putting your hands into a praying position. Hold it for 10 seconds. Repeat.  Alphabet Toes - while laying in bed, write your name in the air with your toes. If you can build up your strength, write the whole alphabet. Do this several times a day.  Side Leg Lifts - while laying on your side, lift your leg up sideways and then bring your leg back down, without touching your legs together. Repeat.  Front Leg Lifts - While laying on your back, life your left leg up, pointing your toe towards the ceiling. Repeat. Switch to right leg.  Gentle Stretching - any kind of stretching helps move blood around in the body and takes stress of your joints if you have been sitting or laying in the same position for a long time. Go through the entire body doing mild stretches, from feet, to legs, to back, to arms, to neck. Doing this when you wake up can be a great way to start the day, and repeating your stretches before bed can help you relax and sleep better.  Level 2 - Recumbent Cardio Exercises This is probably the level that most patients will be able to begin with, although everyone can benefit from the gentle stretching and toning exercises described in Level 1.  Always begin your workout with 5-10 minutes of stretching and/or yoga to warm up your muscles and protect your joints from injury.  Since the point of these exercises is to get your cardiovascular system to be more efficient, you will want to set a target heart rate for your workout. You should speak to your doctor  about this because medications and other medical conditions can impact your target heart rate, but most patients can tolerate a workout at 75% to 80% of their maximum heart rate. [facility] has a Target Heart Rate Calculator you can use as a guide when speaking with your doctor.  You may want to purchase an exercise heart rate monitor to wear during your workouts to help you keep your heart rate within your target zone. We do not endorse any specific products, but exercise heart rate monitors with chest straps are usually more accurate than pulse oximeters you place on your finger. This is especially so for dysautonomia patients who have abnormalities in peripheral blood flow (common in some forms of dysautonomia), as this is more likely to give an inaccurate reading using a finger based monitor.  Suggested reclined cardiovascular exercises include:  Rowing - use a rowing machine, or if you are feeling well enough, a kayak. You may want  to start out slow, maybe 2-5 minutes a day. At your own pace, adding a few minutes per week, try to work your way up to 45 minutes per day, 5 days a week, with 30 minutes of your routine done in your target heart rate zone. Be sure to warm up at the beginning and cool down at the end.  Recumbent Biking - recumbent exercise bikes are different than regular exercise bikes. They seat the rider in a reclined position, rather than upright. Try recumbent biking a few minutes a day, adding a few minutes each week, until you can work out 45 minutes a day, five days a week, with 30 minutes of that workout in your target heart rate zone. Be sure to warm up at the beginning and cool down at the end of each workout.  Swimming - The pressure from water helps prevent orthostatic symptoms. Dysautonomia patients who have been bedridden for years may be able to stand upright for an hour in a pool, because the pressure from the water prevents orthostatic symptoms from occurring, or lessens  their impact. Dysautonomia patients can take advantage of this to get a good cardio workout, or to focus on stretching and strength training in the water. Always swim with a spotter or a buddy who can keep and eye on you, just in case you develop lightheadedness or other symptoms that would make it unsafe to be in a pool. It may be best to start your swimming exercise program at a pool with a lifeguard, or with a Physical Therapist who specializes in aquatic therapy. A good old fashioned kick board can be a great tool for dysautonomia patients. You can kick your way around the pool, which gives you a good cardio workout, and all that kicking helps strengthen your legs. Toning up your legs and core is a great way to minimize orthostatic symptoms.  Weight Training - Most dysautonomia patients can benefit from overall increase in tone and strength. The stronger our muscles are, the more efficiently they use oxygen, the better we will be able to tolerate orthostatic stress. Special emphasis can be placed on strengthening leg and core muscles. Some patients find it useful to wear 2 to 4 lb. weights that attach to the ankles with velcro. This can really help with leg strength if you wear them often enough. There are also machines at the gym that assist with core and leg weight training. Begin with light weights, and use them in a reclined or seated position. Some patients become extra symptomatic when lifting their arms over their head, so take extra care if attempting to do that.  Level 3 - Normal Workouts Some dysautonomia patients are able to jog, run marathons or walk several miles a week. These patients should do whatever they can to continue these activities. Dysautonomia patients who are well-conditioned should exercise 45 minutes a day, at least 3 days per week. Special emphasis should be placed on leg and core strength, and cardiovascular exercises.   Preventing Falls at [facility] are common, often  dreaded events in the lives of older people. Aside from the obvious injuries and even death that may result, fall can cause wide-ranging consequences including loss of independence, mental decline, decreased activity and mobility. Younger people are also at risk of falling, especially those with chronic illnesses and fatigue.  Ways to reduce risk for falling Examine diet and medications. Warm foods and alcohol dilate blood vessels, which can lead to dizziness when standing.  Sleep aids, antidepressants and pain medications can also increase the likelihood of a fall.  Get a vision exam. Poor vision, cataracts and glaucoma increase the chances of falling.  Check foot gear. Shoes should fit snugly and have a sturdy, nonskid sole and a broad, low heel  Participate in a physician-approved exercise program to build and maintain muscle strength and improve balance and coordination. Programs that use ankle weights or stretch bands are excellent for muscle-strengthening. Water aerobics programs and low-impact Tai Chi programs have also been shown to improve balance and coordination.  Increase vitamin D intake. Vitamin D improves muscle strength and increases the amount of calcium the body is able to absorb and deposit in bones.  How to prevent falls from common hazards Floors - Remove all loose wires, cords, and throw rugs. Minimize clutter. Make sure rugs are anchored and smooth. Keep furniture in its usual place.  Chairs -- Use chairs with straight backs, armrests and firm seats. Add firm cushions to existing pieces to add height.  Bathroom - Install grab bars and non-skid tape in the tub or shower. Use a bathtub transfer bench or a shower chair with a back support Use an elevated toilet seat and/or safety rails to assist standing from a low surface. Do not use towel racks or bathroom tissue holders to help you stand.  Lighting - Make sure halls, stairways, and entrances are well-lit. Install a night  light in your bathroom or hallway. Make sure there is a light switch at the top and bottom of the staircase. Turn lights on if you get up in the middle of the night. Make sure lamps or light switches are within reach of the bed if you have to get up during the night.  Kitchen - Install non-skid rubber mats near the sink and stove. Clean spills immediately. Store frequently used utensils, pots, pans between waist and eye level. This helps prevent reaching and bending. Sit when getting things out of lower cupboards.  Living room/ Bedrooms - Place furniture with wide spaces in between, giving enough room to move around. Establish a route through the living room that gives you something to hold onto as you walk.  Stairs - Make sure treads, rails, and rugs are secure. Install a rail on both sides of the stairs. If stairs are a threat, it might be helpful to arrange most of your activities on the lower level to reduce the number of times you must climb the stairs.  Entrances and doorways - Install metal handles on the walls adjacent to the doorknobs of all doors to make it more secure as you travel through the doorway.  Tips for maintaining balance Keep at least one hand free at all times. Try using a backpack or fanny pack to hold things rather than carrying them in your hands. Never carry objects in both hands when walking as this interferes with keeping your balance.  Attempt to swing both arms from front to back while walking. This might require a conscious effort if Parkinson's disease has diminished your movement. It will, however, help you to maintain balance and posture, and reduce fatigue.  Consciously lift your feet off of the ground when walking. Shuffling and dragging of the feet is a common culprit in losing your balance.  When trying to navigate turns, use a "U" technique of facing forward and making a wide turn, rather than pivoting sharply.  Try to stand with your feet shoulder-length  apart. When your feet are close  together for any length of time, you increase your risk of losing your balance and falling.  Do one thing at a time. Don't try to walk and accomplish another task, such as reading or looking around. The decrease in your automatic reflexes complicates motor function, so the less distraction, the better.  Do not wear rubber or gripping soled shoes, they might "catch" on the floor and cause tripping.  Move slowly when changing positions. Use deliberate, concentrated movements and, if needed, use a grab bar or walking aid. Count 15 seconds between each movement. For example, when rising from a seated position, wait 15 seconds after standing to begin walking.  If balance is a continuous problem, you might want to consider a walking aid such as a cane, walking stick, or walker. Once you've mastered walking with help, you might be ready to try it on your own again.

## 2021-12-31 ENCOUNTER — Telehealth: Payer: Self-pay | Admitting: Pulmonary Disease

## 2021-12-31 LAB — COPPER, SERUM: Copper: 105 ug/dL (ref 70–175)

## 2021-12-31 LAB — VITAMIN B1: Vitamin B1 (Thiamine): 13 nmol/L (ref 8–30)

## 2021-12-31 LAB — VITAMIN E
Gamma-Tocopherol (Vit E): <1 mg/L (ref <=4.3)
Vitamin E (Alpha Tocopherol): 8.1 mg/L (ref 5.7–19.9)

## 2021-12-31 MED ORDER — PREDNISONE 10 MG PO TABS: ORAL_TABLET | ORAL | 0 refills | Status: DC

## 2021-12-31 MED ORDER — DOXYCYCLINE HYCLATE 100 MG PO TABS: 100.0000 mg | ORAL_TABLET | Freq: Two times a day (BID) | ORAL | 0 refills | Status: DC

## 2021-12-31 NOTE — Telephone Encounter (Signed)
Pt has a sore throat, congestion and a cough producing yellowish green mucus no SOB it just started this morning. I informed pt I will send this message to Dr Valeta Harms or an available provider. Pt verbalized understanding.

## 2021-12-31 NOTE — Telephone Encounter (Signed)
PCCM:  Orders for doxy and prednisone  BL[facility]   Garner Nash, DO [facility] 12/31/2021 2:04 PM

## 2021-12-31 NOTE — Telephone Encounter (Signed)
Patient called to ask if she can get an antibiotic called in for her sore throat and congestion that has gotten worse since yesterday.  Please call patient to discuss at [phone]

## 2021-12-31 NOTE — Telephone Encounter (Signed)
Spoke to pt informed her that doxycycline and prednisone was sent to [place] for her. Pt verbalized understanding. Nothing further needed.

## 2022-01-01 ENCOUNTER — Other Ambulatory Visit: Payer: Self-pay | Admitting: Family Medicine

## 2022-01-01 ENCOUNTER — Other Ambulatory Visit (HOSPITAL_BASED_OUTPATIENT_CLINIC_OR_DEPARTMENT_OTHER): Payer: Self-pay | Admitting: Cardiology

## 2022-01-01 DIAGNOSIS — E7800 Pure hypercholesterolemia, unspecified: Secondary | ICD-10-CM

## 2022-01-01 DIAGNOSIS — I2510 Atherosclerotic heart disease of native coronary artery without angina pectoris: Secondary | ICD-10-CM

## 2022-01-01 DIAGNOSIS — I428 Other cardiomyopathies: Secondary | ICD-10-CM

## 2022-01-06 ENCOUNTER — Telehealth: Payer: Medicare Other

## 2022-01-07 ENCOUNTER — Telehealth: Payer: Self-pay | Admitting: Neurology

## 2022-01-07 ENCOUNTER — Telehealth: Payer: Medicare Other

## 2022-01-07 ENCOUNTER — Encounter: Payer: Self-pay | Admitting: Nurse Practitioner

## 2022-01-07 NOTE — Telephone Encounter (Signed)
Patient states that she was to have a MRI and has not heard anything from them please check the status of this and call her

## 2022-01-07 NOTE — Telephone Encounter (Signed)
Called [facility] and they are behind in scheduling. She said that she would call  pt today.

## 2022-01-07 NOTE — Telephone Encounter (Signed)
Called and left message that [facility] was going to call her. If she does not hear from them to call us back.

## 2022-01-08 ENCOUNTER — Telehealth (INDEPENDENT_AMBULATORY_CARE_PROVIDER_SITE_OTHER): Payer: Medicare Other | Admitting: Pulmonary Disease

## 2022-01-08 DIAGNOSIS — J441 Chronic obstructive pulmonary disease with (acute) exacerbation: Secondary | ICD-10-CM | POA: Diagnosis not present

## 2022-01-08 MED ORDER — PREDNISONE 10 MG PO TABS: ORAL_TABLET | ORAL | 0 refills | Status: DC

## 2022-01-08 NOTE — Telephone Encounter (Signed)
PCCM:  This note is to document time regarding multiple communications regarding patient's ongoing outpatient management of exacerbation of COPD.  She was prescribed antibiotics last week and is still having ongoing symptoms. Will send another prescription in for repeat taper of prednisone.  I do not think she needs another antibiotic at this time.  Please reference nursing triage intake of symptomatology.  Time: 13 minutes, digital management time  Garner Nash, DO [facility] 01/08/2022 11:14 AM

## 2022-01-08 NOTE — Telephone Encounter (Signed)
Spoke to pt and informed her prednisone was sent to [place] for her. Pt verbalized understanding. Nothing further needed.

## 2022-01-08 NOTE — Telephone Encounter (Signed)
Called patient this evening and she states that she is still having some chest congestion and some wheezing noted at times. No other symptoms. She call last week and Doxy was sent in for her. She states that now her phlegm is clear. But she is wondering does she need any more antibiotics to help clear the rest out of her system. She also asked for a taper of prednisone to help with her wheezing.   Symptoms: Congestion is now clear Wheezing noted at times Coughing is mild No fever No sore throat   OTC:  Mucinex   Finished doxy last night  Please advise sir

## 2022-01-13 DIAGNOSIS — R278 Other lack of coordination: Secondary | ICD-10-CM | POA: Diagnosis not present

## 2022-01-13 DIAGNOSIS — R296 Repeated falls: Secondary | ICD-10-CM | POA: Diagnosis not present

## 2022-01-14 DIAGNOSIS — R2689 Other abnormalities of gait and mobility: Secondary | ICD-10-CM | POA: Diagnosis not present

## 2022-01-14 DIAGNOSIS — R296 Repeated falls: Secondary | ICD-10-CM | POA: Diagnosis not present

## 2022-01-15 ENCOUNTER — Telehealth: Payer: Self-pay | Admitting: Neurology

## 2022-01-15 ENCOUNTER — Emergency Department (HOSPITAL_COMMUNITY): Payer: Medicare Other

## 2022-01-15 ENCOUNTER — Encounter (HOSPITAL_COMMUNITY): Payer: Self-pay

## 2022-01-15 ENCOUNTER — Other Ambulatory Visit: Payer: Self-pay | Admitting: Family Medicine

## 2022-01-15 ENCOUNTER — Other Ambulatory Visit: Payer: Self-pay

## 2022-01-15 ENCOUNTER — Emergency Department (HOSPITAL_COMMUNITY)
Admission: EM | Admit: 2022-01-15 | Discharge: 2022-01-15 | Disposition: A | Discharge status: Home / Self Care | Payer: Medicare Other | Attending: Emergency Medicine | Admitting: Emergency Medicine

## 2022-01-15 DIAGNOSIS — Z87891 Personal history of nicotine dependence: Secondary | ICD-10-CM | POA: Insufficient documentation

## 2022-01-15 DIAGNOSIS — I6381 Other cerebral infarction due to occlusion or stenosis of small artery: Secondary | ICD-10-CM | POA: Insufficient documentation

## 2022-01-15 DIAGNOSIS — R531 Weakness: Secondary | ICD-10-CM | POA: Diagnosis not present

## 2022-01-15 DIAGNOSIS — R29818 Other symptoms and signs involving the nervous system: Secondary | ICD-10-CM | POA: Diagnosis not present

## 2022-01-15 DIAGNOSIS — M5031 Other cervical disc degeneration,  high cervical region: Secondary | ICD-10-CM | POA: Diagnosis not present

## 2022-01-15 DIAGNOSIS — M4802 Spinal stenosis, cervical region: Secondary | ICD-10-CM

## 2022-01-15 DIAGNOSIS — I10 Essential (primary) hypertension: Secondary | ICD-10-CM | POA: Insufficient documentation

## 2022-01-15 DIAGNOSIS — R29898 Other symptoms and signs involving the musculoskeletal system: Secondary | ICD-10-CM | POA: Diagnosis not present

## 2022-01-15 DIAGNOSIS — G9520 Unspecified cord compression: Secondary | ICD-10-CM | POA: Diagnosis not present

## 2022-01-15 DIAGNOSIS — R296 Repeated falls: Secondary | ICD-10-CM | POA: Diagnosis not present

## 2022-01-15 DIAGNOSIS — R29810 Facial weakness: Secondary | ICD-10-CM | POA: Diagnosis not present

## 2022-01-15 DIAGNOSIS — J449 Chronic obstructive pulmonary disease, unspecified: Secondary | ICD-10-CM | POA: Diagnosis not present

## 2022-01-15 DIAGNOSIS — R2689 Other abnormalities of gait and mobility: Secondary | ICD-10-CM | POA: Diagnosis not present

## 2022-01-15 DIAGNOSIS — Z79899 Other long term (current) drug therapy: Secondary | ICD-10-CM | POA: Insufficient documentation

## 2022-01-15 LAB — COMPREHENSIVE METABOLIC PANEL
ALT: 18 U/L (ref 0–44)
AST: 19 U/L (ref 15–41)
Albumin: 3.9 g/dL (ref 3.5–5.0)
Alkaline Phosphatase: 69 U/L (ref 38–126)
Anion gap: 11 (ref 5–15)
BUN: 17 mg/dL (ref 8–23)
CO2: 22 mmol/L (ref 22–32)
Calcium: 9.1 mg/dL (ref 8.9–10.3)
Chloride: 102 mmol/L (ref 98–111)
Creatinine, Ser: 1.06 mg/dL — ABNORMAL HIGH (ref 0.44–1.00)
GFR, Estimated: 57 mL/min — ABNORMAL LOW (ref >60)
Glucose, Bld: 122 mg/dL — ABNORMAL HIGH (ref 70–99)
Potassium: 4.4 mmol/L (ref 3.5–5.1)
Sodium: 135 mmol/L (ref 135–145)
Total Bilirubin: 0.5 mg/dL (ref 0.3–1.2)
Total Protein: 6.6 g/dL (ref 6.5–8.1)

## 2022-01-15 LAB — I-STAT CHEM 8, ED
BUN: 16 mg/dL (ref 8–23)
Calcium, Ion: 1.19 mmol/L (ref 1.15–1.40)
Chloride: 102 mmol/L (ref 98–111)
Creatinine, Ser: 1 mg/dL (ref 0.44–1.00)
Glucose, Bld: 121 mg/dL — ABNORMAL HIGH (ref 70–99)
HCT: 39 % (ref 36.0–46.0)
Hemoglobin: 13.3 g/dL (ref 12.0–15.0)
Potassium: 4.3 mmol/L (ref 3.5–5.1)
Sodium: 139 mmol/L (ref 135–145)
TCO2: 25 mmol/L (ref 22–32)

## 2022-01-15 LAB — DIFFERENTIAL
Abs Immature Granulocytes: 0.05 10*3/uL (ref 0.00–0.07)
Basophils Absolute: 0 10*3/uL (ref 0.0–0.1)
Basophils Relative: 0 %
Eosinophils Absolute: 0 10*3/uL (ref 0.0–0.5)
Eosinophils Relative: 0 %
Immature Granulocytes: 0 %
Lymphocytes Relative: 6 %
Lymphs Abs: 0.7 10*3/uL (ref 0.7–4.0)
Monocytes Absolute: 0.3 10*3/uL (ref 0.1–1.0)
Monocytes Relative: 3 %
Neutro Abs: 10.2 10*3/uL — ABNORMAL HIGH (ref 1.7–7.7)
Neutrophils Relative %: 91 %

## 2022-01-15 LAB — CBC
HCT: 39.9 % (ref 36.0–46.0)
Hemoglobin: 13.2 g/dL (ref 12.0–15.0)
MCH: 31.2 pg (ref 26.0–34.0)
MCHC: 33.1 g/dL (ref 30.0–36.0)
MCV: 94.3 fL (ref 80.0–100.0)
Platelets: 191 10*3/uL (ref 150–400)
RBC: 4.23 MIL/uL (ref 3.87–5.11)
RDW: 13.9 % (ref 11.5–15.5)
WBC: 11.3 10*3/uL — ABNORMAL HIGH (ref 4.0–10.5)
nRBC: 0 % (ref 0.0–0.2)

## 2022-01-15 LAB — ETHANOL: Alcohol, Ethyl (B): <10 mg/dL (ref <10)

## 2022-01-15 LAB — PROTIME-INR
INR: 0.9 (ref 0.8–1.2)
Prothrombin Time: 12.5 seconds (ref 11.4–15.2)

## 2022-01-15 LAB — APTT: aPTT: 22 seconds — ABNORMAL LOW (ref 24–36)

## 2022-01-15 LAB — CBG MONITORING, ED: Glucose-Capillary: 119 mg/dL — ABNORMAL HIGH (ref 70–99)

## 2022-01-15 MED ORDER — OMEPRAZOLE 20 MG PO CPDR: 20.0000 mg | DELAYED_RELEASE_CAPSULE | Freq: Every day | ORAL | 0 refills | Status: DC

## 2022-01-15 MED ORDER — SODIUM CHLORIDE 0.9% FLUSH
3.0000 mL | Freq: Once | INTRAVENOUS | Status: AC
  Administered 2022-01-15: 3 mL via INTRAVENOUS

## 2022-01-15 MED ORDER — MIDAZOLAM HCL 2 MG/2ML IJ SOLN
2.0000 mg | Freq: Once | INTRAMUSCULAR | Status: AC | PRN
  Administered 2022-01-15: 2 mg via INTRAVENOUS
  Filled 2022-01-15: qty 2

## 2022-01-15 NOTE — Telephone Encounter (Signed)
Pt called in stating the pt's left leg is dragging and it feels like the left side of her tongue is thick. She is scheduled to have an MRI on 01/22/22.

## 2022-01-15 NOTE — ED Triage Notes (Addendum)
Pt arrived via [facility] from home for left leg weaknessx3d. Per EMS no gait abnormalities. EMS noted right sided minor facial droop and pt stated she doesn't know if that's normal. HTN 220/116 1st bp. Pt states she started having blurred vision yesterday. Pt stated her left leg weakness started on 01/13/2022. LKW 01/12/22 pt not sure of what time.

## 2022-01-15 NOTE — ED Provider Triage Note (Signed)
Emergency Medicine Provider Triage Evaluation Note  April [name] , a 67 y.o. female  was evaluated in triage.  Pt complains of left leg weakness.  States her left leg has been abnormal for quite some time, but she developed significant weakness approximately 3 days ago.  States her physical therapist said that she should be evaluated.  Denies other symptoms.  Sees neurology and has MRI C-spine for evaluation of bilateral hand numbness on 01/22/2022  Review of Systems  Positive: As above Negative: As above  Physical Exam  BP (!) 196/98 (BP Location: Right Arm)   Pulse 85   Temp 99.7 F (37.6 C)   Resp 18   SpO2 90%  Gen:   Awake, no distress   Resp:  Normal effort  MSK:   Moves extremities without difficulty  Other:  No unilateral or global weakness, facial asymmetry, slurred speech, pronator drift.  Normal heel-to-shin.  Sensation intact in all extremities  Medical Decision Making  Medically screening exam initiated at 12:29 PM.  Appropriate orders placed.  BRYTNEY SOMES was informed that the remainder of the evaluation will be completed by another provider, this initial triage assessment does not replace that evaluation, and the importance of remaining in the ED until their evaluation is complete.  Out of window of treatment. Stroke workup initiated   Nehemiah Massed 01/15/22 1231

## 2022-01-15 NOTE — Telephone Encounter (Signed)
Patient called in has tongue swelling left sided numbness tingling in fingers.Dragging left foot. Advised to call 911 for evaluation.Routiing to Dr. Berdine Addison for [place].

## 2022-01-15 NOTE — Telephone Encounter (Signed)
Patient started having left sided numbness and weakness since last night.  She is dragging the left leg a little bit. I returned her call and she is currently at the ED being worked up for stroke.  She will call after her acute work up as she may need sooner follow up.  All questions were answered.  Kai Levins, MD [facility]

## 2022-01-15 NOTE — ED Notes (Signed)
Patient transported to MRI 

## 2022-01-15 NOTE — ED Notes (Signed)
Patient returned from MRI.

## 2022-01-15 NOTE — ED Provider Notes (Signed)
[facility]PART[facility] Provider Note   CSN: [id number] Arrival date & time: 01/15/22  1211     History  No chief complaint on file.   April [name] is a 67 y.o. female presenting to the ED with left leg weakness.  The patient reports she has had some chronic balance problems and sees physical therapy due to weakness and balance issues with her left leg.  While she was seeing a physical therapist yesterday they noted that her left leg was "dragging more than normal".  She also reports she has had tingling in both of her hands, she feels that she had "swelling or numbness on my tongue".  EMS was called out to the house today who question whether she may be having a facial droop.  She is not sure whether she is.  She denies headache.  She is now here in the ED for further evaluation.  She is not on anticoagulation denies history of TIA or stroke.  She does follow with neurology as an outpatient.  She is noted to be prediabetic, history of COPD is a former smoker, hypertension, hyperlipidemia, temporal arteritis, anxiety and depression.  Her neurologist felt that her gait difficulty was likely multifactorial with contributions from orthostatic intolerance, osteoporosis, osteoarthritis and cervical spine stenosis.  Her neurologist has ordered her an MRI of the cervical spine in 1 week.   She has had CT scans of the C-spine which showed severe right foraminal stenosis at C6-C7 due to calcified disc protrusion.  HPI     Home Medications Prior to Admission medications   Medication Sig Start Date End Date Taking? Authorizing Provider  acetaminophen (TYLENOL) 500 MG tablet Take 1,000 mg by mouth every 6 (six) hours as needed for moderate pain or headache.    [provider]  albuterol (VENTOLIN HFA) 108 (90 Base) MCG/ACT inhaler Inhale 2 puffs into the lungs every 6 (six) hours as needed for wheezing or shortness of breath. Okay to dispense  proair/Ventolin/albuterol. 04/30/21   Icard, Octavio Graves, DO  Budeson-Glycopyrrol-Formoterol (BREZTRI AEROSPHERE) 160-9-4.8 MCG/ACT AERO Inhale 2 puffs into the lungs every 12 (twelve) hours. 07/31/21   Icard, Octavio Graves, DO  buPROPion (WELLBUTRIN XL) 300 MG 24 hr tablet TAKE 1 TABLET BY MOUTH  DAILY 07/18/21   [name], MD  Calcium 500 MG CHEW Chew 500 mg by mouth 2 (two) times daily.    [provider]  doxycycline (VIBRA-TABS) 100 MG tablet Take 1 tablet (100 mg total) by mouth 2 (two) times daily. 12/31/21   Icard, Octavio Graves, DO  ibandronate (BONIVA) 150 MG tablet Take 1 tablet (150 mg total) by mouth every 30 (thirty) days. Take in the morning with a full glass of water, on an empty stomach, and do not take anything else by mouth or lie down for the next 30 min. 05/05/21   [name], MD  levothyroxine (SYNTHROID) 125 MCG tablet TAKE 1 TABLET BY MOUTH DAILY  EXCEPT TAKE 1 AND 1/2 TABLETS BY MOUTH ON SUNDAY 01/01/22   [name], MD  meclizine (ANTIVERT) 25 MG tablet Take 1 tablet (25 mg total) by mouth 3 (three) times daily as needed for dizziness. 10/16/21   Tretha Sciara, MD  metoprolol succinate (TOPROL-XL) 25 MG 24 hr tablet TAKE 1 TABLET BY MOUTH ONCE  DAILY 01/01/22   [name], MD  omeprazole (PRILOSEC) 20 MG capsule Take 1 capsule (20 mg total) by mouth daily. 01/15/22   Octaviano Glow  J, MD  Polyethyl Glycol-Propyl Glycol (SYSTANE OP) Place 1 drop into both eyes as needed.    [provider]  predniSONE (DELTASONE) 10 MG tablet Take 4 tabs by mouth once daily x4 days, then 3 tabs x4 days, 2 tabs x4 days, 1 tab x4 days and stop. 12/31/21   Icard, Octavio Graves, DO  predniSONE (DELTASONE) 10 MG tablet Take 4 tabs by mouth once daily x4 days, then 3 tabs x4 days, 2 tabs x4 days, 1 tab x4 days and stop. 01/08/22   Garner Nash, DO  rosuvastatin (CRESTOR) 10 MG tablet TAKE 1 TABLET BY MOUTH DAILY 01/01/22   Buford Dresser, MD  Spacer/Aero-Holding  Chambers (AEROCHAMBER MV) inhaler Use as instructed 02/06/19   [name], NP  tiZANidine (ZANAFLEX) 4 MG tablet TAKE 1/2 TO 1 TABLET BY  MOUTH EVERY 6 HOURS AS  NEEDED FOR MUSCLE SPASMS Patient not taking: Reported on 12/25/2021 08/08/20   [name], MD  valsartan (DIOVAN) 40 MG tablet TAKE 1 TABLET BY MOUTH DAILY 01/01/22   [name], MD  vitamin C (ASCORBIC ACID) 250 MG tablet Take 500 mg by mouth daily.    [provider]      Allergies    Sulfonamide derivatives, Alendronate sodium, Dilaudid [hydromorphone hcl], Sulfa antibiotics, Other, and Wound dressing adhesive    Review of Systems   Review of Systems  Physical Exam Updated Vital Signs BP (!) 139/95   Pulse 71   Temp 98.2 F (36.8 C) (Oral)   Resp 16   SpO2 96%  Physical Exam Constitutional:      General: She is not in acute distress. HENT:     Head: Normocephalic and atraumatic.  Eyes:     Conjunctiva/sclera: Conjunctivae normal.     Pupils: Pupils are equal, round, and reactive to light.  Cardiovascular:     Rate and Rhythm: Normal rate and regular rhythm.  Pulmonary:     Effort: Pulmonary effort is normal. No respiratory distress.  Skin:    General: Skin is warm and dry.  Neurological:     General: No focal deficit present.     Mental Status: She is alert and oriented to person, place, and time. Mental status is at baseline.     Comments: Patient has 4/5 strength and the left lower extremity with hip flexion and knee extension, 5 out of 5 strength left lower extremity ankle flexion and dorsiflexion.  Sensation is intact. Sensation and strength is intact in the upper extremities in the right lower extremity She does not demonstrate clonus or hyperreflexia on my exam I do not appreciate a facial droop No pronator drift  Psychiatric:        Mood and Affect: Mood normal.        Behavior: Behavior normal.     ED Results / Procedures / Treatments   Labs (all labs ordered are listed,  but only abnormal results are displayed) Labs Reviewed  APTT - Abnormal; Notable for the following components:      Result Value   aPTT 22 (*)    All other components within normal limits  CBC - Abnormal; Notable for the following components:   WBC 11.3 (*)    All other components within normal limits  DIFFERENTIAL - Abnormal; Notable for the following components:   Neutro Abs 10.2 (*)    All other components within normal limits  COMPREHENSIVE METABOLIC PANEL - Abnormal; Notable for the following components:   Glucose, Bld  122 (*)    Creatinine, Ser 1.06 (*)    GFR, Estimated 57 (*)    All other components within normal limits  I-STAT CHEM 8, ED - Abnormal; Notable for the following components:   Glucose, Bld 121 (*)    All other components within normal limits  CBG MONITORING, ED - Abnormal; Notable for the following components:   Glucose-Capillary 119 (*)    All other components within normal limits  PROTIME-INR  ETHANOL    EKG EKG Interpretation  Date/Time:  Thursday January 15 2022 12:05:49 EST Ventricular Rate:  81 PR Interval:  138 QRS Duration: 84 QT Interval:  372 QTC Calculation: 432 R Axis:   79 Text Interpretation: Normal sinus rhythm Normal ECG When compared with ECG of 16-Oct-2021 09:14, PREVIOUS ECG IS PRESENT Confirmed by Octaviano Glow [phone]) on 01/15/2022 6:14:56 PM  Radiology MR Cervical Spine Wo Contrast  Result Date: 01/15/2022 CLINICAL DATA:  Nontraumatic ataxia. Cervical pathology suspected. Transit ischemic attack. Left leg weakness. Left facial droop. EXAM: MRI CERVICAL SPINE WITHOUT CONTRAST TECHNIQUE: Multiplanar, multisequence MR imaging of the cervical spine was performed. No intravenous contrast was administered. COMPARISON:  CT cervical spine 10/16/2021, cervical spine radiographs 12/30/2016 FINDINGS: Alignment: No sagittal spondylolisthesis. The atlantodens interval is intact. Vertebrae: Vertebral body heights are maintained. Mild-to-moderate  left and mild right posterior C3-4 disc space narrowing. Mild posterior C4-5, moderate posterior C5-6, mild-to-moderate diffuse C6-7, and mild C7-T1 disc space narrowing. No acute fracture or destructive bone lesion. Cord: The cervical cord demonstrates normal signal and caliber. Posterior Fossa, vertebral arteries, paraspinal tissues: Negative. Disc levels: C2-3: Mild bilateral facet joint hypertrophy. Mild bilateral uncovertebral hypertrophy. No posterior disc bulge. No central canal or neuroforaminal stenosis. C3-4: Mild-to-moderate bilateral facet joint hypertrophy. Moderate to large left and mild-to-moderate right uncovertebral hypertrophy with associated left-greater-than-right posterior disc bulge. Moderate to severe left-greater-than-right neuroforaminal stenosis. Moderate narrowing of the left lateral recess. No significant central canal stenosis. C4-5: Moderate bilateral facet joint hypertrophy. Mild-to-moderate left-greater-than-right uncovertebral hypertrophy. Mild broad-based posterior disc bulge. Moderate to severe right and moderate left neuroforaminal stenosis. Mild narrowing of lateral recesses. No significant central canal stenosis. C5-6: Moderate right and left facet joint hypertrophy. Moderate broad-based posterior disc osteophyte complex with right-greater-than-left intraforaminal extension. Severe right and moderate left neuroforaminal stenosis. Disc contacts the ventral cord. Ligamentum flavum hypertrophy contacts the posterior cord. Moderate central canal stenosis. No mass effect on the cord. C6-7: Mild-to-moderate bilateral facet joint hypertrophy. Broad-based posterior disc osteophyte complex with large right and mild-to-moderate left intraforaminal disc and endplate spurring. Severe right and mild-to-moderate left neuroforaminal stenosis. Disc contacts the ventral cord. Ligamentum flavum hypertrophy contacts the posterior cord. Moderate central canal stenosis. No mass effect on the cord.  C7-T1: Mild bilateral facet joint hypertrophy. Mild broad-based posterior disc bulge. Borderline mild right neuroforaminal stenosis. No central canal stenosis. T1-T2: Seen on sagittal images only. Mild right neuroforaminal narrowing secondary to facet joint spurring. IMPRESSION: 1. No acute fracture or destructive bone lesion. 2. Multilevel degenerative disc and joint changes as above. 3. Moderate C5-6 and C6-7 central canal stenosis. 4. Multilevel neuroforaminal stenosis including moderate to severe left-greater-than-right C3-4, moderate to severe right and moderate left C4-5, severe right and moderate left C5-6, and severe right and mild-to-moderate left C6-7 neuroforaminal stenosis. Electronically Signed   By: Yvonne Kendall M.D.   On: 01/15/2022 17:07   MR BRAIN WO CONTRAST  Result Date: 01/15/2022 CLINICAL DATA:  Left leg weakness and left facial droop. EXAM: MRI HEAD WITHOUT CONTRAST TECHNIQUE: Multiplanar,  multiecho pulse sequences of the brain and surrounding structures were obtained without intravenous contrast. COMPARISON:  Same-day CT head FINDINGS: Brain: There is no acute intracranial hemorrhage, extra-axial fluid collection, or acute infarct. Parenchymal volume is within normal limits for age. The ventricles are normal in size. Gray-white differentiation is preserved. Parenchymal signal is essentially normal, with no significant burden of underlying chronic small-vessel ischemic change. The pituitary and suprasellar region are normal. There is no mass lesion. There is no mass effect or midline shift. Vascular: Normal flow voids. Skull and upper cervical spine: Normal marrow signal. Sinuses/Orbits: There is mild mucosal thickening in the maxillary sinuses. Bilateral lens implants are in place. The globes and orbits are otherwise unremarkable. Other: None. IMPRESSION: Essentially normal for age appearance of the brain with no acute intracranial pathology. Electronically Signed   By: Valetta Mole M.D.    On: 01/15/2022 17:02   CT HEAD WO CONTRAST  Result Date: 01/15/2022 CLINICAL DATA:  Neuro deficit. EXAM: CT HEAD WITHOUT CONTRAST TECHNIQUE: Contiguous axial images were obtained from the base of the skull through the vertex without intravenous contrast. RADIATION DOSE REDUCTION: This exam was performed according to the departmental dose-optimization program which includes automated exposure control, adjustment of the mA and/or kV according to patient size and/or use of iterative reconstruction technique. COMPARISON:  October 16, 2021 FINDINGS: Brain: There is mild cerebral atrophy with widening of the extra-axial spaces and ventricular dilatation. There are areas of decreased attenuation within the white matter tracts of the supratentorial brain, consistent with microvascular disease changes. A chronic left basal ganglia lacunar infarct is noted. Vascular: No hyperdense vessel or unexpected calcification. Skull: Normal. Negative for fracture or focal lesion. Sinuses/Orbits: There is mild bilateral maxillary sinus mucosal thickening. Other: None. IMPRESSION: 1. No acute intracranial abnormality. 2. Generalized cerebral atrophy with chronic microvascular disease changes of the supratentorial brain. 3. Mild bilateral maxillary sinus disease. Electronically Signed   By: Virgina Norfolk M.D.   On: 01/15/2022 16:34    Procedures Procedures    Medications Ordered in ED Medications  sodium chloride flush (NS) 0.9 % injection 3 mL (3 mLs Intravenous Given 01/15/22 1452)  midazolam (VERSED) injection 2 mg (2 mg Intravenous Given 01/15/22 1544)    ED Course/ Medical Decision Making/ A&P                           Medical Decision Making Amount and/or Complexity of Data Reviewed Labs: ordered. Radiology: ordered.  Risk Prescription drug management.   This patient presents to the ED with concern for potential leg weakness. This involves an extensive number of treatment options, and is a complaint  that carries with it a high risk of complications and morbidity.  The differential diagnosis includes CVA versus spinal lesion versus metabolic derangement versus other  Additional history obtained from EMS  External records from outside source obtained and reviewed including outpatient neurology office visit with Dr Berdine Addison, note reviewed, Dec 2023  I ordered and personally interpreted labs.  The pertinent results include: No emergent findings  I ordered imaging studies including MRI of the brain and cervical spine; CT scan of the brain ordered from triage I independently visualized and interpreted imaging which showed multilevel neuroforaminal stenosis C3-C7 with some moderate spinal canal impingement I agree with the radiologist interpretation  The patient was maintained on a cardiac monitor.  I personally viewed and interpreted the cardiac monitored which showed an underlying rhythm of: Normal sinus rhythm  Per my interpretation the patient's ECG shows normal sinus rhythm no acute ischemic findings  Test Considered: Low suspicion for meningitis, PE, do not feel CT angiogram or lumbar puncture indicated  After the interventions noted above, I reevaluated the patient and found that they have: improved  -Patient initially reported paresthesias in her hand but on my reassessment ported that her sensation was completely back to normal.  She felt that her strength is overall back to baseline and normal.  -I do think with her degree of cervical spinal disease she is needing to be seen in the office by a neurosurgeon I explained this to the patient.  I will provide office information for the on-call neurosurgeon as a referral.  She will call them tomorrow to set up an appointment.  I did recommend following up also with her neurologist.  However, at this time, given that she is functionally back to baseline, and that these are chronic findings, I do think she is reasonably stable for discharge.  She  is already on prednisone as prescribed by her PCP for "bronchitis".  I do not believe she is needing additional steroid medications.  Dispostion:  After consideration of the diagnostic results and the patients response to treatment, I feel that the patent would benefit from outpatient specialist follow-up.  *  Note that at the time of discharge the patient's PCP had apparently attempted to send an omeprazole prescription to the wrong pharmacy.  The patient requested I resent this to the family pharmacy.  This was done.        Final Clinical Impression(s) / ED Diagnoses Final diagnoses:  Cervical spinal cord compression ([place])  Foraminal stenosis of cervical region    Rx / DC Orders ED Discharge Orders          Ordered    omeprazole (PRILOSEC) 20 MG capsule  Daily       Note to Pharmacy: Requesting 1 year supply   01/15/22 1901              Wyvonnia Dusky, MD 01/15/22 1902

## 2022-01-15 NOTE — Discharge Instructions (Addendum)
Your MRI scan showed that you have bulging disks and narrowing of your cervical spine, or your upper neck.  This includes likely pinched nerves.  Some of these findings may be contributing to the tingling in your hands and even the weakness in your leg.  It is very important that you follow-up with both the neurologist as well as the neurosurgeon, who is a spine specialist.  Please call their office tomorrow to set up appointments.  Please continue taking your home medications including the prednisone steroid prescribed by your primary care doctor.  If the weakness in your leg gets worse, or you begin having weakness in other parts of your body, or strokelike symptoms, please return immediately to the ER.

## 2022-01-16 ENCOUNTER — Telehealth: Payer: Self-pay | Admitting: Neurology

## 2022-01-16 NOTE — Telephone Encounter (Signed)
Pt's friend called in stating the discharge summary from the ED stated she should call a neurosurgeon Kristeen Miss, MD. She was wondering if Dr. Berdine Addison would advise her to do this?  The pt has also felt nauseous since yesterday. They were not sure if that might have anything to do with this situation?
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
## 2022-01-20 ENCOUNTER — Encounter: Payer: Self-pay | Admitting: Neurology

## 2022-01-20 ENCOUNTER — Ambulatory Visit: Payer: Medicare Other | Admitting: Neurology

## 2022-01-20 VITALS — BP 153/87 | HR 80 | Ht 66.5 in

## 2022-01-20 DIAGNOSIS — R269 Unspecified abnormalities of gait and mobility: Secondary | ICD-10-CM | POA: Diagnosis not present

## 2022-01-20 DIAGNOSIS — R2689 Other abnormalities of gait and mobility: Secondary | ICD-10-CM | POA: Diagnosis not present

## 2022-01-20 DIAGNOSIS — R2681 Unsteadiness on feet: Secondary | ICD-10-CM

## 2022-01-20 DIAGNOSIS — R296 Repeated falls: Secondary | ICD-10-CM

## 2022-01-20 DIAGNOSIS — M62838 Other muscle spasm: Secondary | ICD-10-CM | POA: Diagnosis not present

## 2022-01-20 DIAGNOSIS — M6289 Other specified disorders of muscle: Secondary | ICD-10-CM

## 2022-01-20 MED ORDER — TIZANIDINE HCL 4 MG PO TABS: ORAL_TABLET | ORAL | 0 refills | Status: DC

## 2022-01-20 NOTE — Patient Instructions (Addendum)
You can cancel the 01/22/21 MRI cervical spine as this has already been done  Please ask Dr. Ellene Route to send his clinic notes to me. I want to see his opinion of your symptoms and MRI results.  Your B12 was borderline low. Given your symptoms, I would recommend supplementing with B12 1000 mcg daily. This can be bought over the counter at any local drug store or online.   I will order a wheelchair for you today. You need to be safe and not fall.  I would like to see you back in clinic in 1 month.  The physicians and staff at [facility] are committed to providing excellent care. You may receive a survey requesting feedback about your experience at our office. We strive to receive "very good" responses to the survey questions. If you feel that your experience would prevent you from giving the office a "very good " response, please contact our office to try to remedy the situation. We may be reached at [phone]. Thank you for taking the time out of your busy day to complete the survey.  Kai Levins, MD [facility]  Preventing Falls at [facility] are common, often dreaded events in the lives of older people. Aside from the obvious injuries and even death that may result, fall can cause wide-ranging consequences including loss of independence, mental decline, decreased activity and mobility. Younger people are also at risk of falling, especially those with chronic illnesses and fatigue.  Ways to reduce risk for falling Examine diet and medications. Warm foods and alcohol dilate blood vessels, which can lead to dizziness when standing. Sleep aids, antidepressants and pain medications can also increase the likelihood of a fall.  Get a vision exam. Poor vision, cataracts and glaucoma increase the chances of falling.  Check foot gear. Shoes should fit snugly and have a sturdy, nonskid sole and a broad, low heel  Participate in a physician-approved exercise program to build and  maintain muscle strength and improve balance and coordination. Programs that use ankle weights or stretch bands are excellent for muscle-strengthening. Water aerobics programs and low-impact Tai Chi programs have also been shown to improve balance and coordination.  Increase vitamin D intake. Vitamin D improves muscle strength and increases the amount of calcium the body is able to absorb and deposit in bones.  How to prevent falls from common hazards Floors - Remove all loose wires, cords, and throw rugs. Minimize clutter. Make sure rugs are anchored and smooth. Keep furniture in its usual place.  Chairs -- Use chairs with straight backs, armrests and firm seats. Add firm cushions to existing pieces to add height.  Bathroom - Install grab bars and non-skid tape in the tub or shower. Use a bathtub transfer bench or a shower chair with a back support Use an elevated toilet seat and/or safety rails to assist standing from a low surface. Do not use towel racks or bathroom tissue holders to help you stand.  Lighting - Make sure halls, stairways, and entrances are well-lit. Install a night light in your bathroom or hallway. Make sure there is a light switch at the top and bottom of the staircase. Turn lights on if you get up in the middle of the night. Make sure lamps or light switches are within reach of the bed if you have to get up during the night.  Kitchen - Install non-skid rubber mats near the sink and stove. Clean spills immediately. Store frequently used utensils, pots, pans between  waist and eye level. This helps prevent reaching and bending. Sit when getting things out of lower cupboards.  Living room/ Bedrooms - Place furniture with wide spaces in between, giving enough room to move around. Establish a route through the living room that gives you something to hold onto as you walk.  Stairs - Make sure treads, rails, and rugs are secure. Install a rail on both sides of the stairs. If stairs  are a threat, it might be helpful to arrange most of your activities on the lower level to reduce the number of times you must climb the stairs.  Entrances and doorways - Install metal handles on the walls adjacent to the doorknobs of all doors to make it more secure as you travel through the doorway.  Tips for maintaining balance Keep at least one hand free at all times. Try using a backpack or fanny pack to hold things rather than carrying them in your hands. Never carry objects in both hands when walking as this interferes with keeping your balance.  Attempt to swing both arms from front to back while walking. This might require a conscious effort if Parkinson's disease has diminished your movement. It will, however, help you to maintain balance and posture, and reduce fatigue.  Consciously lift your feet off of the ground when walking. Shuffling and dragging of the feet is a common culprit in losing your balance.  When trying to navigate turns, use a "U" technique of facing forward and making a wide turn, rather than pivoting sharply.  Try to stand with your feet shoulder-length apart. When your feet are close together for any length of time, you increase your risk of losing your balance and falling.  Do one thing at a time. Don't try to walk and accomplish another task, such as reading or looking around. The decrease in your automatic reflexes complicates motor function, so the less distraction, the better.  Do not wear rubber or gripping soled shoes, they might "catch" on the floor and cause tripping.  Move slowly when changing positions. Use deliberate, concentrated movements and, if needed, use a grab bar or walking aid. Count 15 seconds between each movement. For example, when rising from a seated position, wait 15 seconds after standing to begin walking.  If balance is a continuous problem, you might want to consider a walking aid such as a cane, walking stick, or walker. Once you've  mastered walking with help, you might be ready to try it on your own again.

## 2022-01-21 DIAGNOSIS — M4714 Other spondylosis with myelopathy, thoracic region: Secondary | ICD-10-CM | POA: Diagnosis not present

## 2022-01-22 ENCOUNTER — Telehealth: Payer: Self-pay | Admitting: Neurology

## 2022-01-22 ENCOUNTER — Other Ambulatory Visit: Payer: Medicare Other

## 2022-01-22 ENCOUNTER — Telehealth: Payer: Medicare Other

## 2022-01-22 ENCOUNTER — Other Ambulatory Visit: Payer: Self-pay | Admitting: Neurological Surgery

## 2022-01-22 DIAGNOSIS — R2689 Other abnormalities of gait and mobility: Secondary | ICD-10-CM | POA: Diagnosis not present

## 2022-01-22 DIAGNOSIS — J45909 Unspecified asthma, uncomplicated: Secondary | ICD-10-CM | POA: Diagnosis not present

## 2022-01-22 DIAGNOSIS — J449 Chronic obstructive pulmonary disease, unspecified: Secondary | ICD-10-CM | POA: Diagnosis not present

## 2022-01-22 DIAGNOSIS — M6289 Other specified disorders of muscle: Secondary | ICD-10-CM | POA: Diagnosis not present

## 2022-01-22 DIAGNOSIS — R296 Repeated falls: Secondary | ICD-10-CM | POA: Diagnosis not present

## 2022-01-22 DIAGNOSIS — M4714 Other spondylosis with myelopathy, thoracic region: Secondary | ICD-10-CM

## 2022-01-22 NOTE — Telephone Encounter (Signed)
[facility] and left message with Dr. Clarice Pole nurse to fax Pt latest note for the appointment. Told to call if questions . Left number and fax number.

## 2022-01-22 NOTE — Telephone Encounter (Signed)
Left message with after hour service on 01-22-22 @ 12:50 pm   Caller states that her friend seen the Dr and she can not walk, she has problems with balance she fell twice and needs a wheelchair  her friend is getting her one  but she needs a nurse to come to the house for her    She needs to know if she needs surgery

## 2022-01-22 NOTE — Telephone Encounter (Signed)
Patient left a VM stating that she is returning a call to [facility]

## 2022-01-23 ENCOUNTER — Telehealth: Payer: Self-pay | Admitting: Neurology

## 2022-01-23 ENCOUNTER — Other Ambulatory Visit: Payer: Self-pay

## 2022-01-23 DIAGNOSIS — R2681 Unsteadiness on feet: Secondary | ICD-10-CM

## 2022-01-23 DIAGNOSIS — R296 Repeated falls: Secondary | ICD-10-CM

## 2022-01-23 DIAGNOSIS — R2689 Other abnormalities of gait and mobility: Secondary | ICD-10-CM | POA: Diagnosis not present

## 2022-01-23 DIAGNOSIS — M62838 Other muscle spasm: Secondary | ICD-10-CM

## 2022-01-23 DIAGNOSIS — R269 Unspecified abnormalities of gait and mobility: Secondary | ICD-10-CM

## 2022-01-23 DIAGNOSIS — M6289 Other specified disorders of muscle: Secondary | ICD-10-CM

## 2022-01-23 NOTE — Telephone Encounter (Signed)
Pt left message with AN. She wanted to provide the fax number for [facility]. Fax is [phone].
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
## 2022-01-23 NOTE — Telephone Encounter (Signed)
Called Pt and she informed me that [employer] would not take her insurance and they sent it to [facility]. She received it yesterday. All so she did go see Dr. Ellene [facility] at [facility]. He ordered a MRI of thoracic spine.I have not been able to get in touch with them today to send notes from appointment. She will call back to give me name of a [facility] that is with her insurance. Per Dr. Berdine Addison it can be ordered for her.

## 2022-01-23 NOTE — Telephone Encounter (Signed)
Called Pt and she informed me that [employer] would not take her insurance so they sent it to [employer]

## 2022-01-23 NOTE — Telephone Encounter (Signed)
Ordering Home health

## 2022-01-23 NOTE — Telephone Encounter (Signed)
Pt called in stating she would like to use [facility] for home health. She thinks their name has changed. Their phone number is [phone].

## 2022-01-26 ENCOUNTER — Ambulatory Visit
Admission: RE | Admit: 2022-01-26 | Discharge: 2022-01-26 | Disposition: A | Discharge status: Home / Self Care | Payer: Medicare Other | Source: Ambulatory Visit | Attending: Neurological Surgery | Admitting: Neurological Surgery

## 2022-01-26 ENCOUNTER — Other Ambulatory Visit: Payer: Self-pay

## 2022-01-26 DIAGNOSIS — M62838 Other muscle spasm: Secondary | ICD-10-CM

## 2022-01-26 DIAGNOSIS — M4714 Other spondylosis with myelopathy, thoracic region: Secondary | ICD-10-CM

## 2022-01-26 DIAGNOSIS — R296 Repeated falls: Secondary | ICD-10-CM

## 2022-01-26 DIAGNOSIS — R269 Unspecified abnormalities of gait and mobility: Secondary | ICD-10-CM

## 2022-01-26 DIAGNOSIS — M6289 Other specified disorders of muscle: Secondary | ICD-10-CM

## 2022-01-26 DIAGNOSIS — M47814 Spondylosis without myelopathy or radiculopathy, thoracic region: Secondary | ICD-10-CM | POA: Diagnosis not present

## 2022-01-26 DIAGNOSIS — M549 Dorsalgia, unspecified: Secondary | ICD-10-CM | POA: Diagnosis not present

## 2022-01-26 DIAGNOSIS — R2681 Unsteadiness on feet: Secondary | ICD-10-CM

## 2022-01-26 NOTE — Telephone Encounter (Signed)
Dr Berdine Addison order [facility]. Order to [facility] Fax [phone] was sent

## 2022-01-27 ENCOUNTER — Encounter: Payer: Self-pay | Admitting: Neurology

## 2022-01-27 ENCOUNTER — Telehealth: Payer: Self-pay | Admitting: Pulmonary Disease

## 2022-01-27 ENCOUNTER — Other Ambulatory Visit: Payer: Self-pay

## 2022-01-27 ENCOUNTER — Telehealth: Payer: Self-pay | Admitting: Anesthesiology

## 2022-01-27 ENCOUNTER — Telehealth: Payer: Self-pay | Admitting: Neurology

## 2022-01-27 DIAGNOSIS — R2681 Unsteadiness on feet: Secondary | ICD-10-CM

## 2022-01-27 DIAGNOSIS — R269 Unspecified abnormalities of gait and mobility: Secondary | ICD-10-CM

## 2022-01-27 DIAGNOSIS — M62838 Other muscle spasm: Secondary | ICD-10-CM

## 2022-01-27 DIAGNOSIS — M6289 Other specified disorders of muscle: Secondary | ICD-10-CM

## 2022-01-27 DIAGNOSIS — R296 Repeated falls: Secondary | ICD-10-CM

## 2022-01-27 MED ORDER — MOLNUPIRAVIR 200 MG PO CAPS: 4.0000 | ORAL_CAPSULE | Freq: Two times a day (BID) | ORAL | 0 refills | Status: AC

## 2022-01-27 NOTE — Telephone Encounter (Signed)
Returned patient's call. I shared that her MRI thoracic spine was normal. A clear cause of her symptoms has not been found. We discussed next steps, which I recommended EMG. She was in agreement. I will add her onto my schedule.  Her symptoms are most consistent with an imaging negative myelopathy. I am wondering if this could be an UMN predominant motor neuron disease. EMG will be helpful looking for peripheral involvement.  I will also consider further serum labs including GAD antibodies (stiff person syndrome), HTLV, and paraneoplastic antibodies. A lumbar puncture may also be needed, though this may be less helpful given normal imaging.  All questions were answered.  Kai Levins, MD [facility]

## 2022-01-27 NOTE — Telephone Encounter (Signed)
Called and spoke with patient. She verbalized understanding. Medication has been sent. Nothing further needed at time of call.

## 2022-01-27 NOTE — Telephone Encounter (Signed)
Attempted to call to discuss work up results and next steps, but was unable to reach patient. I left a message asking her to call back when able to our office.  Kai Levins, MD [facility]

## 2022-01-27 NOTE — Telephone Encounter (Signed)
Pt returned Dr Cathey Endow call.

## 2022-01-28 ENCOUNTER — Telehealth: Payer: Self-pay

## 2022-01-28 NOTE — Telephone Encounter (Signed)
Called pt and left a message informing her that I am still working on home health care. I have tried 4 at this time but her insurance they are not taking.

## 2022-01-29 ENCOUNTER — Telehealth: Payer: Self-pay | Admitting: Pulmonary Disease

## 2022-01-29 NOTE — Telephone Encounter (Signed)
Called and spoke with pt who states she had some prednisone left from a prior time that she wanted to know if she should continue to take it. Stated to pt that if she wanted to take it to help with her cough and SOB that she could and she verbalized understanding. Nothing further needed.
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
## 2022-02-04 NOTE — Telephone Encounter (Signed)
Called pateint and no one has called her for home health. Told her I would keep trying.

## 2022-02-05 ENCOUNTER — Ambulatory Visit: Payer: Medicare Other | Admitting: Rheumatology

## 2022-02-05 DIAGNOSIS — M810 Age-related osteoporosis without current pathological fracture: Secondary | ICD-10-CM

## 2022-02-05 DIAGNOSIS — M5136 Other intervertebral disc degeneration, lumbar region: Secondary | ICD-10-CM

## 2022-02-05 DIAGNOSIS — Z87891 Personal history of nicotine dependence: Secondary | ICD-10-CM

## 2022-02-05 DIAGNOSIS — M316 Other giant cell arteritis: Secondary | ICD-10-CM

## 2022-02-05 DIAGNOSIS — Z96643 Presence of artificial hip joint, bilateral: Secondary | ICD-10-CM

## 2022-02-05 DIAGNOSIS — F32A Depression, unspecified: Secondary | ICD-10-CM

## 2022-02-05 DIAGNOSIS — Z8639 Personal history of other endocrine, nutritional and metabolic disease: Secondary | ICD-10-CM

## 2022-02-05 DIAGNOSIS — Z8781 Personal history of (healed) traumatic fracture: Secondary | ICD-10-CM

## 2022-02-05 DIAGNOSIS — R29898 Other symptoms and signs involving the musculoskeletal system: Secondary | ICD-10-CM

## 2022-02-05 DIAGNOSIS — M353 Polymyalgia rheumatica: Secondary | ICD-10-CM

## 2022-02-05 DIAGNOSIS — Z8709 Personal history of other diseases of the respiratory system: Secondary | ICD-10-CM

## 2022-02-05 DIAGNOSIS — Z96612 Presence of left artificial shoulder joint: Secondary | ICD-10-CM

## 2022-02-05 DIAGNOSIS — I10 Essential (primary) hypertension: Secondary | ICD-10-CM

## 2022-02-09 DIAGNOSIS — R296 Repeated falls: Secondary | ICD-10-CM | POA: Diagnosis not present

## 2022-02-09 DIAGNOSIS — R2689 Other abnormalities of gait and mobility: Secondary | ICD-10-CM | POA: Diagnosis not present

## 2022-02-10 ENCOUNTER — Other Ambulatory Visit: Payer: Self-pay | Admitting: Neurology

## 2022-02-10 ENCOUNTER — Telehealth: Payer: Self-pay | Admitting: Neurology

## 2022-02-10 ENCOUNTER — Other Ambulatory Visit (INDEPENDENT_AMBULATORY_CARE_PROVIDER_SITE_OTHER): Payer: Medicare Other

## 2022-02-10 ENCOUNTER — Ambulatory Visit (INDEPENDENT_AMBULATORY_CARE_PROVIDER_SITE_OTHER): Payer: Medicare Other | Admitting: Neurology

## 2022-02-10 DIAGNOSIS — M6289 Other specified disorders of muscle: Secondary | ICD-10-CM | POA: Diagnosis not present

## 2022-02-10 DIAGNOSIS — R269 Unspecified abnormalities of gait and mobility: Secondary | ICD-10-CM | POA: Diagnosis not present

## 2022-02-10 DIAGNOSIS — R296 Repeated falls: Secondary | ICD-10-CM

## 2022-02-10 DIAGNOSIS — M62838 Other muscle spasm: Secondary | ICD-10-CM

## 2022-02-10 DIAGNOSIS — R2681 Unsteadiness on feet: Secondary | ICD-10-CM

## 2022-02-10 NOTE — Telephone Encounter (Signed)
Discussed the results of patient's EMG after the procedure today. EMG was normal with no evidence of motor neuron disease or any other peripheral process. I will continue to work up her imaging negative myelopathy as follows:  Plan: -Home health with PT/OT (still attempting to get approved as patient cannot safely ambulate or take care of all ADLs, but having difficulty with her insurance) -Blood work today: GAD abs (stiff person's syndrome), HTLV, ANA, ENA, ACE, MMA, paraneoplastic panel -Patient is hesitant regarding lumbar puncture, but if this blood work is unremarkable, that would be the next step, which we discussed today.  All questions were answered.  Kai Levins, MD [facility]
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
## 2022-02-10 NOTE — Procedures (Signed)
[facility]  [place], [address]  [place], [place] [address] Tel: [phone] Fax: [phone] Test Date:  02/10/2022  Patient: April [name] DOB: 1956/01/01 Physician: Kai Levins, MD  Sex: Female Height: 5' 6.5" Ref Phys: Kai Levins, MD  ID#: [id number]   Technician:    History: This is a 67 year old female with gait imbalance and falls.  NCV & EMG Findings: Extensive electrodiagnostic evaluation of the left upper and lower limbs shows: Left sural, superficial peroneal/fibular, median, ulnar, and radial sensory responses are within normal limits. Left peroneal/fibular (EDB), tibial (AH), median (APB), and ulnar (ADM) motor responses are within normal limits. Left H reflex latency is within normal limits. There is no evidence of active or chronic motor axon loss changes affecting any of the tested muscles on needle examination. Motor unit configuration and recruitment pattern is within normal limits.  Impression: This is a normal electrodiagnostic evaluation. Specifically: No electrodiagnostic evidence of a disorder of anterior horn cells, ie: motor neuron disease. No electrodiagnostic evidence of a left cervical (C5-C8) or left lumbosacral (L3-S1) motor radiculopathy. No electrodiagnostic evidence of a large fiber neuropathy or myopathy.    ___________________________ Kai Levins, MD    Nerve Conduction Studies Motor Nerve Results    Latency Amplitude F-Lat Segment Distance CV Comment  Site (ms) Norm (mV) Norm (ms)  (cm) (m/s) Norm   Left Fibular (EDB) Motor  Ankle 3.5  < 6.0 6.8  > 2.5        Bel fib head 10.3 - 6.6 -  Bel fib head-Ankle 31 46  > 40   Pop fossa 12.1 - 6.3 -  Pop fossa-Bel fib head 9 50 -   Left Median (APB) Motor  Wrist 2.1  < 4.0 9.8  > 5.0        Elbow 6.9 - 9.6 -  Elbow-Wrist 25.5 53  > 50   Left Tibial (AH) Motor  Ankle 4.3  < 6.0 13.6  > 4.0        Knee 12.4 - 11.4 -  Knee-Ankle 39 48  > 40   Left Ulnar (ADM) Motor  Wrist  1.88  < 3.1 9.3  > 7.0        Bel elbow 5.4 - 8.8 -  Bel elbow-Wrist 21 60  > 50   Ab elbow 7.1 - 8.7 -  Ab elbow-Bel elbow 10 59 -    Sensory Sites    Neg Peak Lat Amplitude (O-P) Segment Distance Velocity Comment  Site (ms) Norm (V) Norm  (cm) (ms)   Left Median Sensory  Wrist-Dig II 2.9  < 3.8 20  > 10 Wrist-Dig II 13    Left Radial Sensory  Forearm-Wrist 2.1  < 2.8 20  > 10 Forearm-Wrist 10    Left Superficial Fibular Sensory  14 cm-Ankle 2.8  < 4.6 5  > 3 14 cm-Ankle 14    Left Sural Sensory  Calf-Lat mall 3.4  < 4.6 4  > 3 Calf-Lat mall 14    Left Ulnar Sensory  Wrist-Dig V 2.7  < 3.2 30  > 5 Wrist-Dig V 11     H-Reflex Results    M-Lat H Lat H Neg Amp H-M Lat  Site (ms) (ms) Norm (mV) (ms)  Left Tibial H-Reflex  Pop fossa 5.8 32.6  < 35.0 1.88 26.8   Electromyography   Side Muscle Ins.Act Fibs Fasc Recrt Amp Dur Poly Activation Comment  Left Tib ant Nml Nml Nml Nml Nml  Nml Nml Nml N/A  Left Gastroc MH Nml Nml Nml Nml Nml Nml Nml Nml N/A  Left Rectus fem Nml Nml Nml Nml Nml Nml Nml Nml N/A  Left Biceps fem SH Nml Nml Nml Nml Nml Nml Nml Nml N/A  Left Gluteus med Nml Nml Nml Nml Nml Nml Nml Nml N/A  Left T10 PSP Nml Nml Nml Nml Nml Nml Nml Nml N/A  Left FDI Nml Nml Nml Nml Nml Nml Nml Nml N/A  Left EIP Nml Nml Nml Nml Nml Nml Nml Nml N/A  Left Pronator teres *CRD Nml Nml Nml Nml Nml Nml Nml N/A  Left Biceps Nml Nml Nml Nml Nml Nml Nml Nml N/A  Left Triceps lat hd Nml Nml Nml Nml Nml Nml Nml Nml N/A  Left Deltoid Nml Nml Nml Nml Nml Nml Nml Nml N/A      Waveforms:  Motor           Sensory             H-Reflex

## 2022-02-11 DIAGNOSIS — M4714 Other spondylosis with myelopathy, thoracic region: Secondary | ICD-10-CM | POA: Diagnosis not present

## 2022-02-11 DIAGNOSIS — G959 Disease of spinal cord, unspecified: Secondary | ICD-10-CM | POA: Diagnosis not present

## 2022-02-11 DIAGNOSIS — R296 Repeated falls: Secondary | ICD-10-CM | POA: Diagnosis not present

## 2022-02-11 DIAGNOSIS — R278 Other lack of coordination: Secondary | ICD-10-CM | POA: Diagnosis not present

## 2022-02-12 ENCOUNTER — Ambulatory Visit: Payer: Medicare Other | Admitting: Physician Assistant

## 2022-02-12 ENCOUNTER — Telehealth: Payer: Self-pay | Admitting: Family Medicine

## 2022-02-12 ENCOUNTER — Telehealth: Payer: Self-pay | Admitting: Anesthesiology

## 2022-02-12 DIAGNOSIS — Z8639 Personal history of other endocrine, nutritional and metabolic disease: Secondary | ICD-10-CM

## 2022-02-12 DIAGNOSIS — Z8709 Personal history of other diseases of the respiratory system: Secondary | ICD-10-CM

## 2022-02-12 DIAGNOSIS — M353 Polymyalgia rheumatica: Secondary | ICD-10-CM

## 2022-02-12 DIAGNOSIS — I10 Essential (primary) hypertension: Secondary | ICD-10-CM

## 2022-02-12 DIAGNOSIS — Z96612 Presence of left artificial shoulder joint: Secondary | ICD-10-CM

## 2022-02-12 DIAGNOSIS — F419 Anxiety disorder, unspecified: Secondary | ICD-10-CM

## 2022-02-12 DIAGNOSIS — M5136 Other intervertebral disc degeneration, lumbar region: Secondary | ICD-10-CM

## 2022-02-12 DIAGNOSIS — Z96643 Presence of artificial hip joint, bilateral: Secondary | ICD-10-CM

## 2022-02-12 DIAGNOSIS — M316 Other giant cell arteritis: Secondary | ICD-10-CM

## 2022-02-12 DIAGNOSIS — M810 Age-related osteoporosis without current pathological fracture: Secondary | ICD-10-CM

## 2022-02-12 DIAGNOSIS — Z87891 Personal history of nicotine dependence: Secondary | ICD-10-CM

## 2022-02-12 DIAGNOSIS — Z8781 Personal history of (healed) traumatic fracture: Secondary | ICD-10-CM

## 2022-02-12 DIAGNOSIS — R29898 Other symptoms and signs involving the musculoskeletal system: Secondary | ICD-10-CM

## 2022-02-12 NOTE — Telephone Encounter (Signed)
April [name] is calling in asking for a call back if possible.

## 2022-02-12 NOTE — Telephone Encounter (Signed)
April [name]--[phone]  [facility]--medication packs--packs will not be delivered until Monday  Patient requesting the following:  levothyroxine (SYNTHROID) 125 MCG tablet  Patient states she no longer does the half tablet on Sunday  ALPRAZolam (XANAX) 0.5 MG tablet  patient is also asking for this medication PRN   Patient has a TOC with Billey Chang on 2.29.24 Will not be able to get refills from her until after that date  Pharmacy able to get all other refill information from [facility]

## 2022-02-12 NOTE — Telephone Encounter (Signed)
Trina from [facility] left a message with AN stating she has a question regarding a test order for clarification. Call back number is [phone], ext [phone]

## 2022-02-12 NOTE — Telephone Encounter (Signed)
Please advise; alprazolam not on med list and levothyroxine needs updating.

## 2022-02-13 ENCOUNTER — Other Ambulatory Visit: Payer: Self-pay

## 2022-02-13 LAB — FANA STAINING PATTERNS
Homogeneous Pattern: 1:320 {titer} — ABNORMAL HIGH
Speckled Pattern: 1:320 {titer} — ABNORMAL HIGH

## 2022-02-13 LAB — AUTOIMMUNE NEUROLOGY AB
AGNA-1: NEGATIVE
AMPA-R1 Antibody: NEGATIVE
AMPA-R2 Antibody: NEGATIVE
Amphiphysin Antibody: NEGATIVE
Anti-Hu Ab: NEGATIVE
Anti-Ri Ab: NEGATIVE
Anti-Yo Ab: NEGATIVE
Antineruonal nuclear Ab Type 3: NEGATIVE
Aquaporin 4 Antibody: NEGATIVE
CASPR2 Antibody,Cell-based IFA: NEGATIVE
CRMP-5 IgG: NEGATIVE
DNER Antibody: NEGATIVE
DPPX Antibody: NEGATIVE
GABA-B-R Antibody: NEGATIVE
GAD65 Antibody: NEGATIVE
ITPR1 Antibody: NEGATIVE
IgLON5 Antibody: NEGATIVE
Interpretation: NEGATIVE
LGI1 Antibody, Cell-based IFA: NEGATIVE
Ma2/Ta Antibody: NEGATIVE
NMDA-R Antibody: NEGATIVE
Purkinje Cell Cyto Ab Type 2: NEGATIVE
Purkinje Cell Cyto Ab Type Tr: NEGATIVE
VGCC Antibody: <1 pmol/L (ref 0.0–30.0)
Zic4 Antibody: NEGATIVE
mGluR1 Antibody: NEGATIVE

## 2022-02-13 LAB — ANA+ENA+DNA/DS+SCL 70+SJOSSA/B
ANA Titer 1: POSITIVE — AB
ENA RNP Ab: <0.2 AI (ref 0.0–0.9)
ENA SM Ab Ser-aCnc: <0.2 AI (ref 0.0–0.9)
ENA SSA (RO) Ab: 0.5 AI (ref 0.0–0.9)
ENA SSB (LA) Ab: <0.2 AI (ref 0.0–0.9)
Scleroderma (Scl-70) (ENA) Antibody, IgG: <0.2 AI (ref 0.0–0.9)
dsDNA Ab: 2 IU/mL (ref 0–9)

## 2022-02-13 LAB — SPECIMEN STATUS REPORT

## 2022-02-13 MED ORDER — ALPRAZOLAM 0.5 MG PO TABS: 0.2500 mg | ORAL_TABLET | Freq: Two times a day (BID) | ORAL | 1 refills | Status: DC | PRN

## 2022-02-13 MED ORDER — LEVOTHYROXINE SODIUM 125 MCG PO TABS: ORAL_TABLET | ORAL | 1 refills | Status: DC

## 2022-02-13 NOTE — Telephone Encounter (Signed)
Sent. Thanks.   

## 2022-02-13 NOTE — Addendum Note (Signed)
Addended by: [name] on: 02/13/2022 07:08 AM   Modules accepted: Orders

## 2022-02-13 NOTE — Telephone Encounter (Signed)
Called and left message to call office

## 2022-02-16 DIAGNOSIS — R296 Repeated falls: Secondary | ICD-10-CM | POA: Diagnosis not present

## 2022-02-16 DIAGNOSIS — R278 Other lack of coordination: Secondary | ICD-10-CM | POA: Diagnosis not present

## 2022-02-17 DIAGNOSIS — R296 Repeated falls: Secondary | ICD-10-CM | POA: Diagnosis not present

## 2022-02-17 DIAGNOSIS — R2689 Other abnormalities of gait and mobility: Secondary | ICD-10-CM | POA: Diagnosis not present

## 2022-02-17 LAB — HTLV I+II ANTIBODIES, (EIA), BLD: HTLV I/II Antibody: NONREACTIVE

## 2022-02-17 LAB — ANGIOTENSIN CONVERTING ENZYME: Angiotensin-Converting Enzyme: 29 U/L (ref 9–67)

## 2022-02-17 LAB — METHYLMALONIC ACID, SERUM: Methylmalonic Acid, Quant: 140 nmol/L (ref 87–318)

## 2022-02-17 LAB — GLUTAMIC ACID DECARBOXYLASE AUTO ABS: Glutamic Acid Decarb Ab: <5 IU/mL (ref <5)

## 2022-02-18 ENCOUNTER — Other Ambulatory Visit: Payer: Self-pay

## 2022-02-18 ENCOUNTER — Telehealth: Payer: Self-pay | Admitting: Neurology

## 2022-02-18 DIAGNOSIS — R2681 Unsteadiness on feet: Secondary | ICD-10-CM

## 2022-02-18 DIAGNOSIS — M62838 Other muscle spasm: Secondary | ICD-10-CM

## 2022-02-18 DIAGNOSIS — R269 Unspecified abnormalities of gait and mobility: Secondary | ICD-10-CM

## 2022-02-18 DIAGNOSIS — M6289 Other specified disorders of muscle: Secondary | ICD-10-CM

## 2022-02-18 DIAGNOSIS — R296 Repeated falls: Secondary | ICD-10-CM | POA: Diagnosis not present

## 2022-02-18 DIAGNOSIS — R2689 Other abnormalities of gait and mobility: Secondary | ICD-10-CM | POA: Diagnosis not present

## 2022-02-18 DIAGNOSIS — R278 Other lack of coordination: Secondary | ICD-10-CM | POA: Diagnosis not present

## 2022-02-18 LAB — ANTISTREPTOLYSIN O TITER: ASO: 157 IU/mL (ref 0.0–200.0)

## 2022-02-18 LAB — METHYLMALONIC ACID, SERUM: Methylmalonic Acid: 168 nmol/L (ref 0–378)

## 2022-02-18 LAB — SPECIMEN STATUS REPORT

## 2022-02-18 LAB — GLUTAMIC ACID DECARBOXYLASE AUTO ABS: Glutamic Acid Decarb Ab: <5 U/mL (ref 0.0–5.0)

## 2022-02-18 NOTE — Telephone Encounter (Signed)
Called patient to discuss results. Extensive blood testing including rheumatologic, paraneoplastic, HTLV, GAD abs were only significant for known positive ANA, but otherwise negative.  I explained that we still did not have a good cause for her symptoms. I recommended LP with CSF analysis, which patient agreed with pursuing.  All questions were answered.  Plan: -LP with the following studies: routine analysis, HSV, VZV, Lyme, HTLV, OCB, IgG index   Kai Levins, MD [facility] Neurology

## 2022-02-19 ENCOUNTER — Other Ambulatory Visit: Payer: Self-pay | Admitting: Neurology

## 2022-02-19 DIAGNOSIS — R296 Repeated falls: Secondary | ICD-10-CM | POA: Diagnosis not present

## 2022-02-19 DIAGNOSIS — R2689 Other abnormalities of gait and mobility: Secondary | ICD-10-CM | POA: Diagnosis not present

## 2022-02-19 DIAGNOSIS — R2681 Unsteadiness on feet: Secondary | ICD-10-CM
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
## 2022-02-20 DIAGNOSIS — R296 Repeated falls: Secondary | ICD-10-CM | POA: Diagnosis not present

## 2022-02-20 DIAGNOSIS — R278 Other lack of coordination: Secondary | ICD-10-CM | POA: Diagnosis not present

## 2022-02-22 DIAGNOSIS — J449 Chronic obstructive pulmonary disease, unspecified: Secondary | ICD-10-CM | POA: Diagnosis not present

## 2022-02-22 DIAGNOSIS — R296 Repeated falls: Secondary | ICD-10-CM | POA: Diagnosis not present

## 2022-02-22 DIAGNOSIS — M6289 Other specified disorders of muscle: Secondary | ICD-10-CM | POA: Diagnosis not present

## 2022-02-22 DIAGNOSIS — R2689 Other abnormalities of gait and mobility: Secondary | ICD-10-CM | POA: Diagnosis not present

## 2022-02-22 DIAGNOSIS — J45909 Unspecified asthma, uncomplicated: Secondary | ICD-10-CM | POA: Diagnosis not present

## 2022-02-23 DIAGNOSIS — R296 Repeated falls: Secondary | ICD-10-CM | POA: Diagnosis not present

## 2022-02-23 DIAGNOSIS — R278 Other lack of coordination: Secondary | ICD-10-CM | POA: Diagnosis not present

## 2022-02-24 ENCOUNTER — Encounter: Payer: Self-pay | Admitting: Physician Assistant

## 2022-02-24 ENCOUNTER — Ambulatory Visit: Payer: Medicare Other | Attending: Rheumatology | Admitting: Physician Assistant

## 2022-02-24 VITALS — BP 112/76 | HR 85 | Resp 14 | Ht 66.5 in | Wt 173.0 lb

## 2022-02-24 DIAGNOSIS — R29898 Other symptoms and signs involving the musculoskeletal system: Secondary | ICD-10-CM

## 2022-02-24 DIAGNOSIS — Z8639 Personal history of other endocrine, nutritional and metabolic disease: Secondary | ICD-10-CM

## 2022-02-24 DIAGNOSIS — Z5181 Encounter for therapeutic drug level monitoring: Secondary | ICD-10-CM

## 2022-02-24 DIAGNOSIS — Z8781 Personal history of (healed) traumatic fracture: Secondary | ICD-10-CM

## 2022-02-24 DIAGNOSIS — M5136 Other intervertebral disc degeneration, lumbar region: Secondary | ICD-10-CM | POA: Diagnosis not present

## 2022-02-24 DIAGNOSIS — M51369 Other intervertebral disc degeneration, lumbar region without mention of lumbar back pain or lower extremity pain: Secondary | ICD-10-CM

## 2022-02-24 DIAGNOSIS — Z96612 Presence of left artificial shoulder joint: Secondary | ICD-10-CM | POA: Diagnosis not present

## 2022-02-24 DIAGNOSIS — I10 Essential (primary) hypertension: Secondary | ICD-10-CM

## 2022-02-24 DIAGNOSIS — Z87891 Personal history of nicotine dependence: Secondary | ICD-10-CM

## 2022-02-24 DIAGNOSIS — Z96643 Presence of artificial hip joint, bilateral: Secondary | ICD-10-CM | POA: Diagnosis not present

## 2022-02-24 DIAGNOSIS — R296 Repeated falls: Secondary | ICD-10-CM

## 2022-02-24 DIAGNOSIS — R2689 Other abnormalities of gait and mobility: Secondary | ICD-10-CM | POA: Diagnosis not present

## 2022-02-24 DIAGNOSIS — M810 Age-related osteoporosis without current pathological fracture: Secondary | ICD-10-CM | POA: Diagnosis not present

## 2022-02-24 DIAGNOSIS — F32A Depression, unspecified: Secondary | ICD-10-CM

## 2022-02-24 DIAGNOSIS — M316 Other giant cell arteritis: Secondary | ICD-10-CM

## 2022-02-24 DIAGNOSIS — R7689 Other specified abnormal immunological findings in serum: Secondary | ICD-10-CM

## 2022-02-24 DIAGNOSIS — M353 Polymyalgia rheumatica: Secondary | ICD-10-CM | POA: Diagnosis not present

## 2022-02-24 DIAGNOSIS — F419 Anxiety disorder, unspecified: Secondary | ICD-10-CM

## 2022-02-24 DIAGNOSIS — R768 Other specified abnormal immunological findings in serum: Secondary | ICD-10-CM | POA: Diagnosis not present

## 2022-02-24 DIAGNOSIS — Z8709 Personal history of other diseases of the respiratory system: Secondary | ICD-10-CM

## 2022-02-26 ENCOUNTER — Encounter: Payer: Self-pay | Admitting: Neurology

## 2022-02-26 ENCOUNTER — Ambulatory Visit: Payer: Medicare Other | Admitting: Neurology

## 2022-02-26 VITALS — BP 136/80 | HR 97 | Ht 66.5 in | Wt 174.0 lb

## 2022-02-26 DIAGNOSIS — R0781 Pleurodynia: Secondary | ICD-10-CM

## 2022-02-26 DIAGNOSIS — R2681 Unsteadiness on feet: Secondary | ICD-10-CM | POA: Diagnosis not present

## 2022-02-26 DIAGNOSIS — R269 Unspecified abnormalities of gait and mobility: Secondary | ICD-10-CM

## 2022-02-26 DIAGNOSIS — R296 Repeated falls: Secondary | ICD-10-CM | POA: Diagnosis not present

## 2022-02-26 DIAGNOSIS — M6289 Other specified disorders of muscle: Secondary | ICD-10-CM

## 2022-02-26 NOTE — Patient Instructions (Addendum)
We will do the lumbar puncture as scheduled.  I am ordering a chest xray to look at your ribs after your fall.  I will be in touch when I have your results.  Continue physical and occupational therapy.   Continue B12 supplementation.  I want to see you back in clinic in 3 months or sooner if needed.  The physicians and staff at [facility] are committed to providing excellent care. You may receive a survey requesting feedback about your experience at our office. We strive to receive "very good" responses to the survey questions. If you feel that your experience would prevent you from giving the office a "very good " response, please contact our office to try to remedy the situation. We may be reached at [phone]. Thank you for taking the time out of your busy day to complete the survey.  Kai Levins, MD [facility]  Preventing Falls at [facility] are common, often dreaded events in the lives of older people. Aside from the obvious injuries and even death that may result, fall can cause wide-ranging consequences including loss of independence, mental decline, decreased activity and mobility. Younger people are also at risk of falling, especially those with chronic illnesses and fatigue.  Ways to reduce risk for falling Examine diet and medications. Warm foods and alcohol dilate blood vessels, which can lead to dizziness when standing. Sleep aids, antidepressants and pain medications can also increase the likelihood of a fall.  Get a vision exam. Poor vision, cataracts and glaucoma increase the chances of falling.  Check foot gear. Shoes should fit snugly and have a sturdy, nonskid sole and a broad, low heel  Participate in a physician-approved exercise program to build and maintain muscle strength and improve balance and coordination. Programs that use ankle weights or stretch bands are excellent for muscle-strengthening. Water aerobics programs and low-impact Tai Chi  programs have also been shown to improve balance and coordination.  Increase vitamin D intake. Vitamin D improves muscle strength and increases the amount of calcium the body is able to absorb and deposit in bones.  How to prevent falls from common hazards Floors - Remove all loose wires, cords, and throw rugs. Minimize clutter. Make sure rugs are anchored and smooth. Keep furniture in its usual place.  Chairs -- Use chairs with straight backs, armrests and firm seats. Add firm cushions to existing pieces to add height.  Bathroom - Install grab bars and non-skid tape in the tub or shower. Use a bathtub transfer bench or a shower chair with a back support Use an elevated toilet seat and/or safety rails to assist standing from a low surface. Do not use towel racks or bathroom tissue holders to help you stand.  Lighting - Make sure halls, stairways, and entrances are well-lit. Install a night light in your bathroom or hallway. Make sure there is a light switch at the top and bottom of the staircase. Turn lights on if you get up in the middle of the night. Make sure lamps or light switches are within reach of the bed if you have to get up during the night.  Kitchen - Install non-skid rubber mats near the sink and stove. Clean spills immediately. Store frequently used utensils, pots, pans between waist and eye level. This helps prevent reaching and bending. Sit when getting things out of lower cupboards.  Living room/ Bedrooms - Place furniture with wide spaces in between, giving enough room to move around. Establish a route through  the living room that gives you something to hold onto as you walk.  Stairs - Make sure treads, rails, and rugs are secure. Install a rail on both sides of the stairs. If stairs are a threat, it might be helpful to arrange most of your activities on the lower level to reduce the number of times you must climb the stairs.  Entrances and doorways - Install metal handles on the  walls adjacent to the doorknobs of all doors to make it more secure as you travel through the doorway.  Tips for maintaining balance Keep at least one hand free at all times. Try using a backpack or fanny pack to hold things rather than carrying them in your hands. Never carry objects in both hands when walking as this interferes with keeping your balance.  Attempt to swing both arms from front to back while walking. This might require a conscious effort if Parkinson's disease has diminished your movement. It will, however, help you to maintain balance and posture, and reduce fatigue.  Consciously lift your feet off of the ground when walking. Shuffling and dragging of the feet is a common culprit in losing your balance.  When trying to navigate turns, use a "U" technique of facing forward and making a wide turn, rather than pivoting sharply.  Try to stand with your feet shoulder-length apart. When your feet are close together for any length of time, you increase your risk of losing your balance and falling.  Do one thing at a time. Don't try to walk and accomplish another task, such as reading or looking around. The decrease in your automatic reflexes complicates motor function, so the less distraction, the better.  Do not wear rubber or gripping soled shoes, they might "catch" on the floor and cause tripping.  Move slowly when changing positions. Use deliberate, concentrated movements and, if needed, use a grab bar or walking aid. Count 15 seconds between each movement. For example, when rising from a seated position, wait 15 seconds after standing to begin walking.  If balance is a continuous problem, you might want to consider a walking aid such as a cane, walking stick, or walker. Once you've mastered walking with help, you might be ready to try it on your own again.

## 2022-02-27 ENCOUNTER — Telehealth: Payer: Self-pay | Admitting: Neurology

## 2022-02-27 DIAGNOSIS — R2689 Other abnormalities of gait and mobility: Secondary | ICD-10-CM | POA: Diagnosis not present

## 2022-02-27 DIAGNOSIS — R278 Other lack of coordination: Secondary | ICD-10-CM | POA: Diagnosis not present

## 2022-02-27 DIAGNOSIS — R296 Repeated falls: Secondary | ICD-10-CM | POA: Diagnosis not present

## 2022-02-27 NOTE — Telephone Encounter (Signed)
She is currently getting OT and PT, but she received a call wanting to do the same OT and PT from [facility]. The pt says she doesn't need extra OT or PT, she needs help with things around the home like laundry and dishes. [facility] does not help with anything like that. She would like to find out if someone could help with home tasks.

## 2022-03-02 DIAGNOSIS — R296 Repeated falls: Secondary | ICD-10-CM | POA: Diagnosis not present

## 2022-03-02 DIAGNOSIS — R2689 Other abnormalities of gait and mobility: Secondary | ICD-10-CM | POA: Diagnosis not present

## 2022-03-03 DIAGNOSIS — R278 Other lack of coordination: Secondary | ICD-10-CM | POA: Diagnosis not present

## 2022-03-03 DIAGNOSIS — R296 Repeated falls: Secondary | ICD-10-CM | POA: Diagnosis not present

## 2022-03-04 DIAGNOSIS — R2689 Other abnormalities of gait and mobility: Secondary | ICD-10-CM | POA: Diagnosis not present

## 2022-03-04 DIAGNOSIS — R296 Repeated falls: Secondary | ICD-10-CM | POA: Diagnosis not present

## 2022-03-04 DIAGNOSIS — R278 Other lack of coordination: Secondary | ICD-10-CM | POA: Diagnosis not present

## 2022-03-04 NOTE — Telephone Encounter (Signed)
Called and left message on voice mail that I have not been able to find anyone that would be able to come in and do light house work for her. The only thing I could find would be private pay. Insurance company's are just not taking in on.

## 2022-03-05 ENCOUNTER — Ambulatory Visit (INDEPENDENT_AMBULATORY_CARE_PROVIDER_SITE_OTHER): Payer: Medicare Other | Admitting: Family Medicine

## 2022-03-05 ENCOUNTER — Encounter: Payer: Self-pay | Admitting: Family Medicine

## 2022-03-05 VITALS — BP 136/88 | HR 89 | Temp 97.9°F | Ht 66.5 in | Wt 175.0 lb

## 2022-03-05 DIAGNOSIS — I5032 Chronic diastolic (congestive) heart failure: Secondary | ICD-10-CM | POA: Diagnosis not present

## 2022-03-05 DIAGNOSIS — J449 Chronic obstructive pulmonary disease, unspecified: Secondary | ICD-10-CM

## 2022-03-05 DIAGNOSIS — M353 Polymyalgia rheumatica: Secondary | ICD-10-CM | POA: Diagnosis not present

## 2022-03-05 DIAGNOSIS — N1831 Chronic kidney disease, stage 3a: Secondary | ICD-10-CM

## 2022-03-05 DIAGNOSIS — R2689 Other abnormalities of gait and mobility: Secondary | ICD-10-CM | POA: Diagnosis not present

## 2022-03-05 DIAGNOSIS — E039 Hypothyroidism, unspecified: Secondary | ICD-10-CM

## 2022-03-05 DIAGNOSIS — R296 Repeated falls: Secondary | ICD-10-CM | POA: Diagnosis not present

## 2022-03-05 DIAGNOSIS — Z87891 Personal history of nicotine dependence: Secondary | ICD-10-CM

## 2022-03-05 DIAGNOSIS — R278 Other lack of coordination: Secondary | ICD-10-CM | POA: Diagnosis not present

## 2022-03-05 DIAGNOSIS — I10 Essential (primary) hypertension: Secondary | ICD-10-CM

## 2022-03-05 DIAGNOSIS — Z8739 Personal history of other diseases of the musculoskeletal system and connective tissue: Secondary | ICD-10-CM | POA: Diagnosis not present

## 2022-03-05 DIAGNOSIS — R600 Localized edema: Secondary | ICD-10-CM

## 2022-03-05 DIAGNOSIS — M810 Age-related osteoporosis without current pathological fracture: Secondary | ICD-10-CM

## 2022-03-05 DIAGNOSIS — F339 Major depressive disorder, recurrent, unspecified: Secondary | ICD-10-CM

## 2022-03-05 DIAGNOSIS — K635 Polyp of colon: Secondary | ICD-10-CM

## 2022-03-05 LAB — TSH: TSH: 0.21 u[IU]/mL — ABNORMAL LOW (ref 0.35–5.50)

## 2022-03-05 MED ORDER — FUROSEMIDE 20 MG PO TABS: 20.0000 mg | ORAL_TABLET | Freq: Every day | ORAL | 3 refills | Status: DC | PRN

## 2022-03-05 MED ORDER — POTASSIUM CHLORIDE CRYS ER 10 MEQ PO TBCR: 10.0000 meq | EXTENDED_RELEASE_TABLET | Freq: Every day | ORAL | 3 refills | Status: DC | PRN

## 2022-03-05 NOTE — Progress Notes (Signed)
Please call patient: I have reviewed his/her lab results. The thyroid test remains off and dose needs to be lowered further. Please change to 138mg daily. Will recheck again in 3 months. She has 123m dose at home I believe. Please verify her current dose.

## 2022-03-05 NOTE — Progress Notes (Signed)
Subjective  CC:  Chief Complaint  Patient presents with   Establish Care    HPI: April [name] is a 67 y.o. female who presents to [place] at [place] today to establish care with me as a new patient.  I have reviewed past several years of notes from pulm, cards, primary care, neuro, gastro, rheum and GI.  I have reviewed labs from several years.  Reviewed dexa studies x 2 Reviewed colonoscopy findings 2023 I have reviewed calcium CT findings and echocardiogram.   She has the following concerns or needs: To summarize, pleasant separated 67 year old female lives alone in adult community apartment multiple medical problems.  Most pressing problem unstable gait with frequent falls.  Current etiology is unclear.  Evaluation per neurology is ongoing.  Patient scheduled to have an LP to help further delineate etiology.  Brain MRI has been normal.  Multiple lab studies have been unrevealing.  She is using a walker and requests a new one.  Her prior PCP is ordered this for her. She has chronic depression and has been controlled on medications. Hypertension with diastolic heart failure.  Recently HCTZ was stopped due to a drop in her blood pressure.  However since, her diastolics are running elevated.  She does complain of chronic lower extremity edema. Hypothyroidism with last TSH 0.03 in November.  Medication dose was adjusted down.  She is due for repeat TSH.  She does admit to feeling shaky.  No weight changes.  She is compliant with her medication. She has chronic kidney disease Osteoporosis: Reviewed most recent DEXA.  She is to continue on Boniva for the next 2 years then recheck.  Fortunately no fractures.  She has been treated with Boniva in the past as well as Forteo x 18 months.  I reviewed notes. Past history of temporal arteritis and PMR: Recent rheumatology evaluation without flares. She is a former smoker and has COPD.  She is managed by pulmonology.  She is not  oxygen dependent.  She does have lung cancer screenings ordered. Serrated polyps on recent colonoscopy.  High risk patient.  Overdue for repeat colonoscopy with Dr. Loletha Carrow.  Assessment  1. Essential hypertension   2. Osteoporosis, unspecified osteoporosis type, unspecified pathological fracture presence   3. Frequent falls   4. Acquired hypothyroidism   5. Chronic recurrent major depressive disorder (HCC)   6. Stage 3a chronic kidney disease ([place])   7. COPD, severe ([place])   8. Chronic diastolic CHF (congestive heart failure) ([place])   9. Bilateral lower extremity edema   10. History of temporal arteritis   11. Polymyalgia rheumatica ([place])   12. Former smoker   16. Serrated polyp of colon      Plan  Hypertension: Elevated diastolics.  Likely due to stopping HCTZ.  Given bilateral lower extremity edema unresponsive to compression stockings will start Lasix as needed.  Potassium 10 mill equivalents to be taken with her.  She will monitor her blood pressures and will recheck again in 3 months. Frequent falls per neurology Hypothyroidism for recheck today COPD and lung cancer screening per pulmonology. Depression stable on current medications Recommend contacting GI office to get scheduled for her colonoscopy.  Patient agrees PMR and history of temporal arteritis unfortunately stable. Frequent falls: Neurology undergoing evaluation.  Neurosurgical eval did not think cervical stenosis was cause.  Fall prevention discussed.  I spent a total of 62 minutes for this patient encounter. Time spent included preparation, face-to-face counseling with the patient  and coordination of care, review of chart and records, and documentation of the encounter.  Follow up: 3 months for recheck Orders Placed This Encounter  Procedures   TSH   Meds ordered this encounter  Medications   furosemide (LASIX) 20 MG tablet    Sig: Take 1 tablet (20 mg total) by mouth daily as needed for edema. Take with potassium     Dispense:  30 tablet    Refill:  3   potassium chloride SA (KLOR-CON M) 10 MEQ tablet    Sig: Take 1 tablet (10 mEq total) by mouth daily as needed (leg swelling). Take with furosemide    Dispense:  30 tablet    Refill:  3        03/05/2022    1:12 PM 11/26/2021    9:38 AM 07/17/2021    2:06 PM 11/25/2020   10:16 AM 11/22/2019   12:18 PM  Depression screen PHQ 2/9  Decreased Interest 0 0 0 0 0  Down, Depressed, Hopeless 0 0 0 0 0  PHQ - 2 Score 0 0 0 0 0  Altered sleeping  0 0 0 0  Tired, decreased energy  0 3 0 0  Change in appetite  0 0 0 0  Feeling bad or failure about yourself   0 0 0 0  Trouble concentrating  0 0 0 0  Moving slowly or fidgety/restless  0 0 0 0  Suicidal thoughts  0 0 0 0  PHQ-9 Score  0 3 0 0  Difficult doing work/chores  Not difficult at all Not difficult at all Not difficult at all Not difficult at all    We updated and reviewed the patient's past history in detail and it is documented below.  Patient Active Problem List   Diagnosis Date Noted   Mixed hyperlipidemia 12/03/2021    Priority: High   Chronic diastolic CHF (congestive heart failure) ([place]) 06/13/2021    Priority: High   COPD, severe ([place]) 11/24/2017    Priority: High    L basilar xray findings     CKD (chronic kidney disease), stage III ([place]) 10/28/2017    Priority: High   Essential hypertension 06/23/2017    Priority: High   Polymyalgia rheumatica ([place]) 05/22/2016    Priority: High   Asthma 03/23/2010    Priority: High    Qualifier: Diagnosis of  By: Damita Dunnings MD, Phillip Heal      Acquired hypothyroidism 03/21/2010    Priority: High   Chronic recurrent major depressive disorder ([place]) 03/21/2010    Priority: High    Qualifier: Diagnosis of  By: Damita Dunnings MD, [place]      Upper airway cough syndrome 05/07/2021    Priority: Medium    Osteoporosis 03/23/2021    Priority: Medium     Plan is/was to take ibandronate through spring 2023-2025 and then recheck DXA.  Dxa 2023  t=-2.7,  continue boniva through 2024.  She didn't tolerate fosamax.   She prev took forteo for 18 months.   She has not been on prolia.       Aortic atherosclerosis ([place]) 12/17/2017    Priority: Medium     Noted on CT 12/2017    Pulmonary nodule 12/17/2017    Priority: Medium     On CT 12/2017    GERD (gastroesophageal reflux disease) 12/12/2017    Priority: Medium    History of bilateral hip replacements 05/22/2016    Priority: Medium    DDD (degenerative disc disease), lumbar 05/22/2016  Priority: Medium    Former smoker 03/21/2010    Priority: Medium    History of temporal arteritis 03/21/2010    Priority: Medium     Qualifier: Diagnosis of  By: Damita Dunnings MD, Phillip Heal      Allergic rhinitis 04/06/2019    Priority: Low   History of avascular necrosis of capital femoral epiphysis 08/14/2014    Priority: Low   Frequent falls 03/05/2022   Serrated polyp of colon 03/05/2022   Unsteady gait 07/20/2021   Health Maintenance  Topic Date Due   DTaP/Tdap/Td (2 - Td or Tdap) 04/05/2021   COLONOSCOPY (Pts 45-48yr Insurance coverage will need to be confirmed)  12/04/2021   COVID-19 Vaccine (5 - 2023-24 season) 03/21/2022 (Originally 09/05/2021)   MAMMOGRAM  06/18/2022   DEXA SCAN  02/26/2023   Medicare Annual Wellness (AWV)  03/05/2023   Pneumonia Vaccine 69 Years old  Completed   INFLUENZA VACCINE  Completed   Hepatitis C Screening  Completed   Zoster Vaccines- Shingrix  Completed   HPV VACCINES  Aged Out   Immunization History  Administered Date(s) Administered   Fluad Quad(high Dose 65+) 08/26/2018, 11/24/2019, 09/30/2020, 10/08/2021   Influenza Split 10/15/2010   Influenza,inj,Quad PF,6+ Mos 10/05/2012, 10/24/2013, 10/12/2014, 11/12/2015, 10/16/2016, 10/14/2017   Influenza-Unspecified 08/26/2018   PFIZER(Purple Top)SARS-COV-2 Vaccination 03/05/2019, 04/04/2019, 10/27/2019, 07/30/2020   Pneumococcal Conjugate-13 10/26/2018   Pneumococcal Polysaccharide-23 01/05/2009, 11/24/2019    Tdap 04/06/2011   Zoster Recombinat (Shingrix) 09/05/2020, 01/05/2021   Current Meds  Medication Sig   acetaminophen (TYLENOL) 500 MG tablet Take 1,000 mg by mouth every 6 (six) hours as needed for moderate pain or headache.   albuterol (VENTOLIN HFA) 108 (90 Base) MCG/ACT inhaler Inhale 2 puffs into the lungs every 6 (six) hours as needed for wheezing or shortness of breath. Okay to dispense proair/Ventolin/albuterol.   ALPRAZolam (XANAX) 0.5 MG tablet Take 0.5-1 tablets (0.25-0.5 mg total) by mouth 2 (two) times daily as needed. for anxiety   Budeson-Glycopyrrol-Formoterol (BREZTRI AEROSPHERE) 160-9-4.8 MCG/ACT AERO Inhale 2 puffs into the lungs every 12 (twelve) hours.   buPROPion (WELLBUTRIN XL) 300 MG 24 hr tablet TAKE 1 TABLET BY MOUTH  DAILY   Calcium 500 MG CHEW Chew 500 mg by mouth 2 (two) times daily.   Cyanocobalamin (VITAMIN B 12 PO) Take by mouth.   furosemide (LASIX) 20 MG tablet Take 1 tablet (20 mg total) by mouth daily as needed for edema. Take with potassium   ibandronate (BONIVA) 150 MG tablet Take 1 tablet (150 mg total) by mouth every 30 (thirty) days. Take in the morning with a full glass of water, on an empty stomach, and do not take anything else by mouth or lie down for the next 30 min.   levothyroxine (SYNTHROID) 125 MCG tablet TAKE 1 TABLET BY MOUTH DAILY  EXCEPT TAKE 1 AND 1/2 TABLETS BY MOUTH ON SUNDAY   metoprolol succinate (TOPROL-XL) 25 MG 24 hr tablet TAKE 1 TABLET BY MOUTH ONCE  DAILY   omeprazole (PRILOSEC) 20 MG capsule Take 1 capsule (20 mg total) by mouth daily.   Polyethyl Glycol-Propyl Glycol (SYSTANE OP) Place 1 drop into both eyes as needed.   potassium chloride SA (KLOR-CON M) 10 MEQ tablet Take 1 tablet (10 mEq total) by mouth daily as needed (leg swelling). Take with furosemide   rosuvastatin (CRESTOR) 10 MG tablet TAKE 1 TABLET BY MOUTH DAILY   Spacer/Aero-Holding Chambers (AEROCHAMBER MV) inhaler Use as instructed   tiZANidine (ZANAFLEX) 4 MG  tablet TAKE 1/2  TO 1 TABLET BY  MOUTH EVERY 6 HOURS AS  NEEDED FOR MUSCLE SPASMS   valsartan (DIOVAN) 40 MG tablet TAKE 1 TABLET BY MOUTH DAILY   vitamin C (ASCORBIC ACID) 250 MG tablet Take 500 mg by mouth daily.    Allergies: Patient is allergic to sulfonamide derivatives, alendronate sodium, dilaudid [hydromorphone hcl], sulfa antibiotics, other, and wound dressing adhesive. Past Medical History Patient  has a past medical history of Allergy, Anemia, Anxiety, Anxiety and depression, Arthritis, Asthma, AVN (avascular necrosis of bone) ([place]), Bronchitis, Cataract, Chronic kidney disease, COPD (chronic obstructive pulmonary disease) ([place]), Depression, Emphysema of lung ([place]), Endometriosis, FH: CAD (coronary artery disease), GERD (gastroesophageal reflux disease), History of blood transfusion, History of palpitations, Hyperlipidemia, Hypertension, Hypothyroid, Neuromuscular disorder ([place]), Non-ischemic cardiomyopathy ([place]) (2019), Osteopenia, Osteoporosis, PMR (polymyalgia rheumatica) ([place]), SIRS (systemic inflammatory response syndrome) ([place]) (10/2017), Smoker, SOB (shortness of breath) on exertion, Squamous cell carcinoma, and Temporal arteritis ([place]). Past Surgical History Patient  has a past surgical history that includes Incontinence surgery; Wrist surgery (Left); Cholecystectomy; Abdominal adhesion surgery; Abdominal hysterectomy; Tonsillectomy; Breast surgery (Left); Joint replacement (Left, 2012); Cardiac catheterization (2007); Colonoscopy w/ polypectomy; Total hip arthroplasty (Right, 10/04/2012); Breast excisional biopsy (Left); Breast reduction surgery (Bilateral, 06/13/2019); Reduction mammaplasty; Reverse shoulder arthroplasty (Left, 05/28/2020); Cataract extraction (Bilateral); Total shoulder replacement (Left); and Colonoscopy. Family History: Patient family history includes Alzheimer's disease in her mother; Arthritis in her brother; Breast cancer in her maternal aunt; Colon cancer in  her mother; Colon polyps in her brother; Healthy in her son; Heart disease in her mother; Hypertension in her mother; Kidney failure in her mother; Suicidality in her brother. Social History:  Patient  reports that she quit smoking about 4 years ago. Her smoking use included cigarettes. She has a 11.22 pack-year smoking history. She has been exposed to tobacco smoke. She has never used smokeless tobacco. She reports current alcohol use. She reports that she does not use drugs.  Review of Systems: Constitutional: negative for fever or malaise Ophthalmic: negative for photophobia, double vision or loss of vision Cardiovascular: negative for chest pain, dyspnea on exertion, or new LE swelling Respiratory: negative for SOB or persistent cough Gastrointestinal: negative for abdominal pain, change in bowel habits or melena Integumentary: negative for new or persistent rashes Neurological: negative for TIA or stroke symptoms Psychiatric: negative for SI or delusions Allergic/Immunologic: negative for hives  Patient Care Team    Relationship Specialty Notifications Start End  Leamon Arnt, MD PCP - General Family Medicine  12/25/21   Buford Dresser, MD PCP - Cardiology Cardiology  08/20/21   Bo Merino, MD Consulting Physician Rheumatology  08/18/16   Charlton Haws, [facility] Pharmacist Pharmacist  04/29/21    Comment: [phone]  Shellia Carwin, MD Consulting Physician Neurology  12/25/21   Doran Stabler, MD Consulting Physician Gastroenterology  03/05/22     Objective  Vitals: BP 136/88   Pulse 89   Temp 97.9 F (36.6 C)   Ht 5' 6.5" (1.689 m)   Wt 175 lb (79.4 kg)   SpO2 96%   BMI 27.82 kg/m  General:  Well developed, well nourished, no acute distress  Psych:  Alert and oriented,normal mood and affect HEENT:  Normocephalic, atraumatic, non-icteric sclera,  Cardiovascular:  RRR without gallop, rub or murmur Respiratory:  Good breath sounds bilaterally, CTAB  with normal respiratory effort Extremities with compression stockings  Neurologic:    Mental status is normal.  Commons side effects, risks, benefits, and alternatives for  medications and treatment plan prescribed today were discussed, and the patient expressed understanding of the given instructions. Patient is instructed to call or message via MyChart if he/she has any questions or concerns regarding our treatment plan. No barriers to understanding were identified. We discussed Red Flag symptoms and signs in detail. Patient expressed understanding regarding what to do in case of urgent or emergency type symptoms.  Medication list was reconciled, printed and provided to the patient in AVS. Patient instructions and summary information was reviewed with the patient as documented in the AVS. This note was prepared with assistance of Dragon voice recognition software. Occasional wrong-word or sound-a-like substitutions may have occurred due to the inherent limitations of voice recognition software  This visit occurred during the SARS-CoV-2 public health emergency.  Safety protocols were in place, including screening questions prior to the visit, additional usage of staff PPE, and extensive cleaning of exam room while observing appropriate contact time as indicated for disinfecting solutions.

## 2022-03-05 NOTE — Patient Instructions (Signed)
Please return in 3 months to recheck blood pressure and swelling.   I have ordered a fluid pill to take as needed for swelling. Please take potassium supplement with it.   I will let you know what your thyroid level is now. It was off in November.   It was a pleasure meeting you today! Thank you for choosing [place] to meet your healthcare needs! I truly look forward to working with you. If you have any questions or concerns, please send me a message via Mychart or call the office at [phone].

## 2022-03-09 ENCOUNTER — Other Ambulatory Visit: Payer: Self-pay | Admitting: Family Medicine

## 2022-03-09 DIAGNOSIS — R2689 Other abnormalities of gait and mobility: Secondary | ICD-10-CM | POA: Diagnosis not present

## 2022-03-09 DIAGNOSIS — M62838 Other muscle spasm: Secondary | ICD-10-CM

## 2022-03-09 DIAGNOSIS — R296 Repeated falls: Secondary | ICD-10-CM | POA: Diagnosis not present

## 2022-03-09 DIAGNOSIS — R278 Other lack of coordination: Secondary | ICD-10-CM | POA: Diagnosis not present

## 2022-03-09 DIAGNOSIS — M6289 Other specified disorders of muscle: Secondary | ICD-10-CM

## 2022-03-10 ENCOUNTER — Telehealth: Payer: Self-pay | Admitting: Family Medicine

## 2022-03-10 ENCOUNTER — Ambulatory Visit
Admission: RE | Admit: 2022-03-10 | Discharge: 2022-03-10 | Disposition: A | Discharge status: Home / Self Care | Payer: Medicare Other | Source: Ambulatory Visit | Attending: Neurology | Admitting: Neurology

## 2022-03-10 VITALS — BP 141/94 | HR 81

## 2022-03-10 DIAGNOSIS — R2681 Unsteadiness on feet: Secondary | ICD-10-CM | POA: Diagnosis not present

## 2022-03-10 DIAGNOSIS — R2689 Other abnormalities of gait and mobility: Secondary | ICD-10-CM | POA: Diagnosis not present

## 2022-03-10 DIAGNOSIS — R296 Repeated falls: Secondary | ICD-10-CM

## 2022-03-10 DIAGNOSIS — R269 Unspecified abnormalities of gait and mobility: Secondary | ICD-10-CM | POA: Diagnosis not present

## 2022-03-10 NOTE — Telephone Encounter (Signed)
Letter is valid for one year; good until July 2024. We can print letter out for her to pick up or can mail to patient. LMTCB

## 2022-03-10 NOTE — Discharge Instructions (Signed)

## 2022-03-10 NOTE — Progress Notes (Signed)
Blood drawn from pts RAC to be sent off with LP lab work. 1 successful attempt. Pt tolerated well. Gauze and tape applied.

## 2022-03-10 NOTE — Telephone Encounter (Signed)
Patient called in and stated that the letter can be mailed out to her, please. Thank you!

## 2022-03-10 NOTE — Telephone Encounter (Signed)
Pt called stating Dr. Damita Dunnings gave her a letter regarding receiving a Rollator awhile back & she misplaced the letter. Pt is asking can Dr. Damita Dunnings give her another one or is the letter no longer valid since she's no longer Dr. Josefine Class pt? Call back # [phone]

## 2022-03-11 ENCOUNTER — Telehealth: Payer: Self-pay | Admitting: Neurology

## 2022-03-11 NOTE — Telephone Encounter (Signed)
Letter has been placed in the mail

## 2022-03-11 NOTE — Telephone Encounter (Signed)
Attempted to call patient regarding LP, but patient did not answer. I left a message that routine analysis was normal, but a lot of CSF labs are still pending.   Kai Levins, MD [facility]

## 2022-03-12 ENCOUNTER — Telehealth: Payer: Self-pay | Admitting: Cardiology

## 2022-03-12 DIAGNOSIS — R278 Other lack of coordination: Secondary | ICD-10-CM | POA: Diagnosis not present

## 2022-03-12 DIAGNOSIS — R296 Repeated falls: Secondary | ICD-10-CM | POA: Diagnosis not present

## 2022-03-12 NOTE — Telephone Encounter (Signed)
Pt c/o medication issue:  1. Name of Medication: rosuvastatin (CRESTOR) 10 MG tablet   2. How are you currently taking this medication (dosage and times per day)?   TAKE 1 TABLET BY MOUTH DAILY    3. Are you having a reaction (difficulty breathing--STAT)? No  4. What is your medication issue? Pt states that she thinks medication is causing her to have a lot of falls and wanted to know if she can have a alternate medication. Please advise.

## 2022-03-12 NOTE — Telephone Encounter (Signed)
Returned call to patient and provided the pharmD recommendations. She states that is reasonable and will keep [place] updated.

## 2022-03-12 NOTE — Telephone Encounter (Signed)
Has been seeing neuro and had lumbar puncture yesterday. Results pending. Would recommend seeing what results say before making any medication changes.

## 2022-03-12 NOTE — Telephone Encounter (Signed)
Returned call to patient,   Patient states that she has been falling a lot lately, even  had to go the ED twice. She has started seeing a neurologist for a spinal tap yesterday. She states that a couple residents that she lives with mentioned stopping their statin and their falling got better. She was started on crestor 5 2/23, and it was increased to '10mg'$  in June of 23. She states she has been falling about 4 months.   She states that her left ankle and foot are very swollen, she has been too afraid to take her lasix due to the reaction she had to HCTZ. She denies this impairing her ability to walk. She does states that she has been more inactive lately, her neurologist even ordered her a wheel chair. She did not like the wheel chair so she is back to using her rolator.   When asked if she had any presyncopal when up and moving. She states when this falling first falling she did feel lightheaded, but she has not had any of those symptoms in the last few months.  She states she knows when she gets up to stand there for a minute, and she doesn't feel anything until she goes to take a step and she states that she started falling to the left. She does state that she always starts falling to the left. She says her left foot and ankle have been swollen for about 3 weeks. Her GP is checking her thyroid.   Will route to Dr. Harrell Gave and PharmD for review. Patient states she knows this is all very confusing but she is just very at a loss and wants to stop falling.

## 2022-03-13 ENCOUNTER — Other Ambulatory Visit: Payer: Self-pay | Admitting: Family Medicine

## 2022-03-13 DIAGNOSIS — R296 Repeated falls: Secondary | ICD-10-CM | POA: Diagnosis not present

## 2022-03-13 DIAGNOSIS — R278 Other lack of coordination: Secondary | ICD-10-CM | POA: Diagnosis not present

## 2022-03-13 LAB — HERPES SIMPLEX VIRUS, TYPE 1 AND 2 DNA,QUAL,RT PCR
HSV 1 DNA: NOT DETECTED
HSV 2 DNA: NOT DETECTED

## 2022-03-16 ENCOUNTER — Other Ambulatory Visit: Payer: Self-pay | Admitting: Family Medicine

## 2022-03-16 NOTE — Telephone Encounter (Signed)
Refill omeprazole (PRILOSEC) 20 MG capsule 

## 2022-03-16 NOTE — Telephone Encounter (Signed)
This is in [place] to refill?  April [name]

## 2022-03-17 DIAGNOSIS — R296 Repeated falls: Secondary | ICD-10-CM | POA: Diagnosis not present

## 2022-03-17 DIAGNOSIS — R2689 Other abnormalities of gait and mobility: Secondary | ICD-10-CM | POA: Diagnosis not present

## 2022-03-17 LAB — CNS IGG SYNTHESIS RATE, CSF+BLOOD
Albumin Serum: 4 g/dL (ref 3.6–5.1)
Albumin, CSF: 16.2 mg/dL (ref 8.0–42.0)
CNS-IgG Synthesis Rate: -5.4 mg/24 h (ref <=3.3)
IgG (Immunoglobin G), Serum: 1080 mg/dL (ref 600–1540)
IgG Total CSF: 1.7 mg/dL (ref 0.8–7.7)
IgG-Index: 0.39 (ref <0.70)

## 2022-03-17 LAB — CSF CELL COUNT WITH DIFFERENTIAL
RBC Count, CSF: 0 cells/uL
TOTAL NUCLEATED CELL: 1 cells/uL (ref 0–5)

## 2022-03-17 LAB — VARICELLA ZOSTER VIRUS (VZV) DNA, QUANTITATIVE REAL-TIME PCR
VARICELLA ZOSTER VIRUS (VZV) DNA, QN RT PCR: NOT DETECTED Log cps/mL
VARICELLA ZOSTER VIRUS (VZV) DNA, QN RT PCR: NOT DETECTED copies/mL

## 2022-03-17 LAB — GLUCOSE, CSF: Glucose, CSF: 37 mg/dL — ABNORMAL LOW (ref 40–80)

## 2022-03-17 LAB — OLIGOCLONAL BANDS, CSF + SERM: Oligo Bands: ABSENT

## 2022-03-17 LAB — BORRELIA BURGDORFERI DNA, QUALITATIVE REAL-TIME PCR, MISC: BORRELIA BURGDORFERI DNA, QL REAL TIME PCR, MISC: NOT DETECTED

## 2022-03-17 LAB — PROTEIN, CSF: Total Protein, CSF: 30 mg/dL (ref 15–60)

## 2022-03-18 ENCOUNTER — Telehealth: Payer: Self-pay | Admitting: Neurology

## 2022-03-18 DIAGNOSIS — R278 Other lack of coordination: Secondary | ICD-10-CM | POA: Diagnosis not present

## 2022-03-18 DIAGNOSIS — R2689 Other abnormalities of gait and mobility: Secondary | ICD-10-CM | POA: Diagnosis not present

## 2022-03-18 DIAGNOSIS — R296 Repeated falls: Secondary | ICD-10-CM | POA: Diagnosis not present

## 2022-03-18 NOTE — Telephone Encounter (Signed)
Called patient to discuss results of LP and CSF studies. Her extensive work up including serum labs, CSF studies, and neuroaxis imaging have not shown a clear reason for her imbalance and upper motor neuron signs on exam. Her B12 was borderline low, but it seems unlikely this would drive all of her symptoms.  She does mention that she may be starting to improve while working with PT (and continuing B12 supplementation).  She did not have change in her symptoms after LP.  We discussed next steps, including referral to academic medical center (such as [facility] or [facility]) for second opinion as extensive work up has been negative or monitoring her symptoms.  Patient decided since she may be showing improvement, that she would like to monitor symptoms for now. She will call with new or worsening symptoms and follow up on 06/10/22 with me as planned.  All questions were answered.  Kai Levins, MD [facility]

## 2022-03-19 DIAGNOSIS — R2689 Other abnormalities of gait and mobility: Secondary | ICD-10-CM | POA: Diagnosis not present

## 2022-03-19 DIAGNOSIS — R296 Repeated falls: Secondary | ICD-10-CM | POA: Diagnosis not present

## 2022-03-20 ENCOUNTER — Other Ambulatory Visit: Payer: Self-pay | Admitting: Family Medicine

## 2022-03-20 DIAGNOSIS — R278 Other lack of coordination: Secondary | ICD-10-CM | POA: Diagnosis not present

## 2022-03-20 DIAGNOSIS — R296 Repeated falls: Secondary | ICD-10-CM | POA: Diagnosis not present

## 2022-03-20 DIAGNOSIS — I428 Other cardiomyopathies: Secondary | ICD-10-CM

## 2022-03-23 ENCOUNTER — Ambulatory Visit (INDEPENDENT_AMBULATORY_CARE_PROVIDER_SITE_OTHER): Payer: Medicare Other | Admitting: Family Medicine

## 2022-03-23 ENCOUNTER — Encounter: Payer: Self-pay | Admitting: Family Medicine

## 2022-03-23 VITALS — BP 136/82 | HR 97 | Temp 98.1°F | Ht 66.5 in | Wt 171.8 lb

## 2022-03-23 DIAGNOSIS — E039 Hypothyroidism, unspecified: Secondary | ICD-10-CM

## 2022-03-23 DIAGNOSIS — I10 Essential (primary) hypertension: Secondary | ICD-10-CM

## 2022-03-23 DIAGNOSIS — I5032 Chronic diastolic (congestive) heart failure: Secondary | ICD-10-CM | POA: Diagnosis not present

## 2022-03-23 DIAGNOSIS — R2689 Other abnormalities of gait and mobility: Secondary | ICD-10-CM | POA: Diagnosis not present

## 2022-03-23 DIAGNOSIS — J45909 Unspecified asthma, uncomplicated: Secondary | ICD-10-CM | POA: Diagnosis not present

## 2022-03-23 DIAGNOSIS — R296 Repeated falls: Secondary | ICD-10-CM | POA: Diagnosis not present

## 2022-03-23 DIAGNOSIS — M6289 Other specified disorders of muscle: Secondary | ICD-10-CM | POA: Diagnosis not present

## 2022-03-23 DIAGNOSIS — R7989 Other specified abnormal findings of blood chemistry: Secondary | ICD-10-CM | POA: Diagnosis not present

## 2022-03-23 DIAGNOSIS — I700 Atherosclerosis of aorta: Secondary | ICD-10-CM | POA: Diagnosis not present

## 2022-03-23 DIAGNOSIS — J449 Chronic obstructive pulmonary disease, unspecified: Secondary | ICD-10-CM | POA: Diagnosis not present

## 2022-03-23 DIAGNOSIS — R278 Other lack of coordination: Secondary | ICD-10-CM | POA: Diagnosis not present

## 2022-03-23 LAB — TSH: TSH: 0.06 u[IU]/mL — ABNORMAL LOW (ref 0.35–5.50)

## 2022-03-23 LAB — BASIC METABOLIC PANEL
BUN: 21 mg/dL (ref 6–23)
CO2: 24 mEq/L (ref 19–32)
Calcium: 9.5 mg/dL (ref 8.4–10.5)
Chloride: 106 mEq/L (ref 96–112)
Creatinine, Ser: 1.01 mg/dL (ref 0.40–1.20)
GFR: 56.99 mL/min — ABNORMAL LOW (ref >60.00)
Glucose, Bld: 84 mg/dL (ref 70–99)
Potassium: 4.4 mEq/L (ref 3.5–5.1)
Sodium: 140 mEq/L (ref 135–145)

## 2022-03-23 LAB — VITAMIN D 25 HYDROXY (VIT D DEFICIENCY, FRACTURES): VITD: 68.11 ng/mL (ref 30.00–100.00)

## 2022-03-23 MED ORDER — LEVOTHYROXINE SODIUM 125 MCG PO TABS: 125.0000 ug | ORAL_TABLET | Freq: Every day | ORAL | 0 refills | Status: DC

## 2022-03-23 NOTE — Progress Notes (Signed)
Subjective  CC:  Chief Complaint  Patient presents with   Hypothyroidism    Pt stated that Dr. Damita Dunnings took her off of Vit D    HPI: April [name] is a 67 y.o. female who presents to the office today to address the problems listed above in the chief complaint. Hypothyroidism: was confusion about her dose; her TSH remains low but she had told us Dr. Damita Dunnings had her stop the levothyroxine. However, he had stopped the Vitamin D. So she has been on levothyroxine 110mcg daily down from 168mcg daily and 1.5 tabs on Sunday. She feels better overall. Less tremors. Still has hair thinning. No missed doses.  Vit D: was taking otc 5000 units daily. Last checked: level was 138. No off for months.  HTN w/ CHF and edema. Hasn't tried the lasix yet. Wears compression stockings. But did take potassium for the last several days. No cp or sob.    Assessment  1. Acquired hypothyroidism   2. Aortic atherosclerosis (HCC) Chronic  3. Chronic diastolic CHF (congestive heart failure) ([place])   4. Essential hypertension   5. High serum vitamin D      Plan  Low thyroid:  recheck today and adjust dose down if indicated. Education given.  CHF: trial of lasix and potassium. Recheck potassium levels today. Bp should hold. Reassured.  Vit D: recheck today and will restart at 1000units daily if back in range. Education given.   Follow up: as scheduled  06/04/2022  Orders Placed This Encounter  Procedures   TSH   Basic metabolic panel   VITAMIN D 25 Hydroxy (Vit-D Deficiency, Fractures)   Meds ordered this encounter  Medications   levothyroxine (SYNTHROID) 125 MCG tablet    Sig: Take 1 tablet (125 mcg total) by mouth daily before breakfast.    Dispense:  109 tablet    Refill:  0      I reviewed the patients updated PMH, FH, and SocHx.    Patient Active Problem List   Diagnosis Date Noted   Mixed hyperlipidemia 12/03/2021    Priority: High   Chronic diastolic CHF (congestive heart failure) ([place])  06/13/2021    Priority: High   COPD, severe ([employer]) 11/24/2017    Priority: High   CKD (chronic kidney disease), stage III ([place]) 10/28/2017    Priority: High   Essential hypertension 06/23/2017    Priority: High   Polymyalgia rheumatica ([place]) 05/22/2016    Priority: High   Asthma 03/23/2010    Priority: High   Acquired hypothyroidism 03/21/2010    Priority: High   Chronic recurrent major depressive disorder ([place]) 03/21/2010    Priority: High   Upper airway cough syndrome 05/07/2021    Priority: Medium    Osteoporosis 03/23/2021    Priority: Medium    Aortic atherosclerosis ([place]) 12/17/2017    Priority: Medium    Pulmonary nodule 12/17/2017    Priority: Medium    GERD (gastroesophageal reflux disease) 12/12/2017    Priority: Medium    History of bilateral hip replacements 05/22/2016    Priority: Medium    DDD (degenerative disc disease), lumbar 05/22/2016    Priority: Medium    Former smoker 03/21/2010    Priority: Medium    History of temporal arteritis 03/21/2010    Priority: Medium    Allergic rhinitis 04/06/2019    Priority: Low   History of avascular necrosis of capital femoral epiphysis 08/14/2014    Priority: Low   Frequent falls 03/05/2022  Serrated polyp of colon 03/05/2022   Unsteady gait 07/20/2021   Current Meds  Medication Sig   acetaminophen (TYLENOL) 500 MG tablet Take 1,000 mg by mouth every 6 (six) hours as needed for moderate pain or headache.   albuterol (VENTOLIN HFA) 108 (90 Base) MCG/ACT inhaler Inhale 2 puffs into the lungs every 6 (six) hours as needed for wheezing or shortness of breath. Okay to dispense proair/Ventolin/albuterol.   ALPRAZolam (XANAX) 0.5 MG tablet Take 0.5-1 tablets (0.25-0.5 mg total) by mouth 2 (two) times daily as needed. for anxiety   Budeson-Glycopyrrol-Formoterol (BREZTRI AEROSPHERE) 160-9-4.8 MCG/ACT AERO Inhale 2 puffs into the lungs every 12 (twelve) hours.   buPROPion (WELLBUTRIN XL) 300 MG 24 hr tablet TAKE 1 TABLET  BY MOUTH  DAILY   Calcium 500 MG CHEW Chew 500 mg by mouth 2 (two) times daily.   Cyanocobalamin (VITAMIN B 12 PO) Take by mouth.   furosemide (LASIX) 20 MG tablet Take 1 tablet (20 mg total) by mouth daily as needed for edema. Take with potassium   ibandronate (BONIVA) 150 MG tablet Take 1 tablet (150 mg total) by mouth every 30 (thirty) days. Take in the morning with a full glass of water, on an empty stomach, and do not take anything else by mouth or lie down for the next 30 min.   metoprolol succinate (TOPROL-XL) 25 MG 24 hr tablet TAKE 1 TABLET BY MOUTH EVERY DAY   omeprazole (PRILOSEC) 20 MG capsule TAKE 1 CAPSULE BY MOUTH EVERY DAY   Polyethyl Glycol-Propyl Glycol (SYSTANE OP) Place 1 drop into both eyes as needed.   potassium chloride SA (KLOR-CON M) 10 MEQ tablet Take 1 tablet (10 mEq total) by mouth daily as needed (leg swelling). Take with furosemide   rosuvastatin (CRESTOR) 10 MG tablet TAKE 1 TABLET BY MOUTH DAILY   Spacer/Aero-Holding Chambers (AEROCHAMBER MV) inhaler Use as instructed   tiZANidine (ZANAFLEX) 4 MG tablet TAKE HALF TO ONE TABLET BY MOUTH EVERY 6 HOURS AS NEEDED FOR MUSCLE SPASMS   valsartan (DIOVAN) 40 MG tablet TAKE 1 TABLET BY MOUTH EVERY DAY   vitamin C (ASCORBIC ACID) 250 MG tablet Take 500 mg by mouth daily.   [DISCONTINUED] levothyroxine (SYNTHROID) 125 MCG tablet TAKE 1 TABLET BY MOUTH AT BEDTIME EVERY DAY EXCEPT SUNDAY and TAKE 1 AND 1/2 TABLETS AT BEDTIME ON SUNDAYS    Allergies: Patient is allergic to sulfonamide derivatives, alendronate sodium, dilaudid [hydromorphone hcl], sulfa antibiotics, other, and wound dressing adhesive. Family History: Patient family history includes Alzheimer's disease in her mother; Arthritis in her brother; Breast cancer in her maternal aunt; Colon cancer in her mother; Colon polyps in her brother; Healthy in her son; Heart disease in her mother; Hypertension in her mother; Kidney failure in her mother; Suicidality in her  brother. Social History:  Patient  reports that she quit smoking about 4 years ago. Her smoking use included cigarettes. She has a 11.22 pack-year smoking history. She has been exposed to tobacco smoke. She has never used smokeless tobacco. She reports current alcohol use. She reports that she does not use drugs.  Review of Systems: Constitutional: Negative for fever malaise or anorexia Cardiovascular: negative for chest pain Respiratory: negative for SOB or persistent cough Gastrointestinal: negative for abdominal pain  Objective  Vitals: BP 136/82   Pulse 97   Temp 98.1 F (36.7 C)   Ht 5' 6.5" (1.689 m)   Wt 171 lb 12.8 oz (77.9 kg)   SpO2 95%   BMI 27.31  kg/m  General: no acute distress , A&Ox3 HEENT: [place]ER[place], conjunctiva normal, neck is supple Cardiovascular:  RRR without murmur or gallop.  Respiratory:  Good breath sounds bilaterally, CTAB with normal respiratory effort +1 edema bileaterally Commons side effects, risks, benefits, and alternatives for medications and treatment plan prescribed today were discussed, and the patient expressed understanding of the given instructions. Patient is instructed to call or message via MyChart if he/she has any questions or concerns regarding our treatment plan. No barriers to understanding were identified. We discussed Red Flag symptoms and signs in detail. Patient expressed understanding regarding what to do in case of urgent or emergency type symptoms.  Medication list was reconciled, printed and provided to the patient in AVS. Patient instructions and summary information was reviewed with the patient as documented in the AVS. This note was prepared with assistance of Dragon voice recognition software. Occasional wrong-word or sound-a-like substitutions may have occurred due to the inherent limitations of voice recognition software

## 2022-03-23 NOTE — Patient Instructions (Signed)
Please follow up as scheduled for your next visit with me: 06/04/2022   We will call you to let you know how to proceed with the levothyroxine and your Vitamin D.  If you try lasix, take with potassium. Do not take potassium alone.   If you have any questions or concerns, please don't hesitate to send me a message via MyChart or call the office at [phone]. Thank you for visiting with [place] today! It's our pleasure caring for you.

## 2022-03-24 ENCOUNTER — Other Ambulatory Visit: Payer: Self-pay

## 2022-03-24 DIAGNOSIS — E039 Hypothyroidism, unspecified: Secondary | ICD-10-CM

## 2022-03-24 DIAGNOSIS — R2689 Other abnormalities of gait and mobility: Secondary | ICD-10-CM | POA: Diagnosis not present

## 2022-03-24 DIAGNOSIS — R296 Repeated falls: Secondary | ICD-10-CM | POA: Diagnosis not present

## 2022-03-24 MED ORDER — LEVOTHYROXINE SODIUM 112 MCG PO TABS: 112.0000 ug | ORAL_TABLET | Freq: Every day | ORAL | 3 refills | Status: DC

## 2022-03-24 NOTE — Progress Notes (Signed)
Please call patient: I have reviewed his/her lab results. Thyroid dose needs to be decreased. Please order levothyroxine 13mcg daily. We will recheck her levels again at her f/u appt on may 30th.  Vit D levels are now normal again. She may take otc 1000 units daily. Potassium levels are ok; reinforce to use potassium only if she takes lasix for swelling.  Thanks.

## 2022-03-26 ENCOUNTER — Ambulatory Visit: Payer: Medicare Other | Admitting: Neurology

## 2022-03-26 ENCOUNTER — Ambulatory Visit: Payer: Medicare Other | Admitting: Pulmonary Disease

## 2022-03-26 ENCOUNTER — Encounter: Payer: Self-pay | Admitting: Pulmonary Disease

## 2022-03-26 VITALS — BP 104/70 | HR 81 | Ht 66.5 in | Wt 170.3 lb

## 2022-03-26 DIAGNOSIS — I5020 Unspecified systolic (congestive) heart failure: Secondary | ICD-10-CM

## 2022-03-26 DIAGNOSIS — J449 Chronic obstructive pulmonary disease, unspecified: Secondary | ICD-10-CM | POA: Diagnosis not present

## 2022-03-26 DIAGNOSIS — Z8616 Personal history of COVID-19: Secondary | ICD-10-CM

## 2022-03-26 DIAGNOSIS — I5032 Chronic diastolic (congestive) heart failure: Secondary | ICD-10-CM

## 2022-03-26 MED ORDER — AZITHROMYCIN 250 MG PO TABS: 250.0000 mg | ORAL_TABLET | ORAL | 3 refills | Status: DC
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
## 2022-03-26 NOTE — Patient Instructions (Signed)
Thank you for visiting Dr. Valeta Harms at [facility]. Today we recommend the following:  Continue current inhaler regimen Restart azithromycin   Meds ordered this encounter  Medications   azithromycin (ZITHROMAX) 250 MG tablet    Sig: Take 1 tablet (250 mg total) by mouth every Monday, Wednesday, and Friday.    Dispense:  36 tablet    Refill:  3   Return in about 6 months (around 09/26/2022) for with APP or Dr. Valeta Harms.    Please do your part to reduce the spread of COVID-19.

## 2022-03-27 DIAGNOSIS — R2689 Other abnormalities of gait and mobility: Secondary | ICD-10-CM | POA: Diagnosis not present

## 2022-03-27 DIAGNOSIS — R296 Repeated falls: Secondary | ICD-10-CM | POA: Diagnosis not present

## 2022-03-27 DIAGNOSIS — R278 Other lack of coordination: Secondary | ICD-10-CM | POA: Diagnosis not present

## 2022-04-01 DIAGNOSIS — R296 Repeated falls: Secondary | ICD-10-CM | POA: Diagnosis not present

## 2022-04-01 DIAGNOSIS — R278 Other lack of coordination: Secondary | ICD-10-CM | POA: Diagnosis not present

## 2022-04-02 DIAGNOSIS — R296 Repeated falls: Secondary | ICD-10-CM | POA: Diagnosis not present

## 2022-04-02 DIAGNOSIS — R2689 Other abnormalities of gait and mobility: Secondary | ICD-10-CM | POA: Diagnosis not present

## 2022-04-06 DIAGNOSIS — R296 Repeated falls: Secondary | ICD-10-CM | POA: Diagnosis not present

## 2022-04-06 DIAGNOSIS — R2689 Other abnormalities of gait and mobility: Secondary | ICD-10-CM | POA: Diagnosis not present

## 2022-04-08 DIAGNOSIS — R278 Other lack of coordination: Secondary | ICD-10-CM | POA: Diagnosis not present

## 2022-04-08 DIAGNOSIS — R296 Repeated falls: Secondary | ICD-10-CM | POA: Diagnosis not present

## 2022-04-15 DIAGNOSIS — R2689 Other abnormalities of gait and mobility: Secondary | ICD-10-CM | POA: Diagnosis not present

## 2022-04-15 DIAGNOSIS — R296 Repeated falls: Secondary | ICD-10-CM | POA: Diagnosis not present

## 2022-04-17 DIAGNOSIS — R278 Other lack of coordination: Secondary | ICD-10-CM | POA: Diagnosis not present

## 2022-04-17 DIAGNOSIS — R296 Repeated falls: Secondary | ICD-10-CM | POA: Diagnosis not present

## 2022-04-20 ENCOUNTER — Other Ambulatory Visit: Payer: Self-pay | Admitting: Family Medicine

## 2022-04-21 ENCOUNTER — Other Ambulatory Visit: Payer: Self-pay | Admitting: Family Medicine

## 2022-04-22 DIAGNOSIS — R2689 Other abnormalities of gait and mobility: Secondary | ICD-10-CM | POA: Diagnosis not present

## 2022-04-22 DIAGNOSIS — R296 Repeated falls: Secondary | ICD-10-CM | POA: Diagnosis not present

## 2022-04-23 DIAGNOSIS — R296 Repeated falls: Secondary | ICD-10-CM | POA: Diagnosis not present

## 2022-04-23 DIAGNOSIS — M6289 Other specified disorders of muscle: Secondary | ICD-10-CM | POA: Diagnosis not present

## 2022-04-23 DIAGNOSIS — J449 Chronic obstructive pulmonary disease, unspecified: Secondary | ICD-10-CM | POA: Diagnosis not present

## 2022-04-23 DIAGNOSIS — J45909 Unspecified asthma, uncomplicated: Secondary | ICD-10-CM | POA: Diagnosis not present

## 2022-04-23 DIAGNOSIS — R2689 Other abnormalities of gait and mobility: Secondary | ICD-10-CM | POA: Diagnosis not present

## 2022-04-24 DIAGNOSIS — R278 Other lack of coordination: Secondary | ICD-10-CM | POA: Diagnosis not present

## 2022-04-24 DIAGNOSIS — R296 Repeated falls: Secondary | ICD-10-CM | POA: Diagnosis not present

## 2022-05-01 DIAGNOSIS — R296 Repeated falls: Secondary | ICD-10-CM | POA: Diagnosis not present

## 2022-05-01 DIAGNOSIS — R278 Other lack of coordination: Secondary | ICD-10-CM | POA: Diagnosis not present

## 2022-05-05 DIAGNOSIS — R2689 Other abnormalities of gait and mobility: Secondary | ICD-10-CM | POA: Diagnosis not present

## 2022-05-05 DIAGNOSIS — R296 Repeated falls: Secondary | ICD-10-CM | POA: Diagnosis not present

## 2022-05-07 DIAGNOSIS — R2689 Other abnormalities of gait and mobility: Secondary | ICD-10-CM | POA: Diagnosis not present

## 2022-05-07 DIAGNOSIS — R296 Repeated falls: Secondary | ICD-10-CM | POA: Diagnosis not present

## 2022-05-08 DIAGNOSIS — R296 Repeated falls: Secondary | ICD-10-CM | POA: Diagnosis not present

## 2022-05-08 DIAGNOSIS — R278 Other lack of coordination: Secondary | ICD-10-CM | POA: Diagnosis not present

## 2022-05-11 ENCOUNTER — Telehealth: Payer: Self-pay | Admitting: Pulmonary Disease

## 2022-05-11 NOTE — Telephone Encounter (Signed)
Patient states needs refill for Albuterol inhaler. Pharmacy is [address] Patient phone number is [phone].

## 2022-05-12 DIAGNOSIS — R2689 Other abnormalities of gait and mobility: Secondary | ICD-10-CM | POA: Diagnosis not present

## 2022-05-12 DIAGNOSIS — R296 Repeated falls: Secondary | ICD-10-CM | POA: Diagnosis not present

## 2022-05-14 ENCOUNTER — Other Ambulatory Visit: Payer: Self-pay | Admitting: Family Medicine

## 2022-05-14 DIAGNOSIS — Z1231 Encounter for screening mammogram for malignant neoplasm of breast: Secondary | ICD-10-CM

## 2022-05-14 MED ORDER — ALBUTEROL SULFATE HFA 108 (90 BASE) MCG/ACT IN AERS: 2.0000 | INHALATION_SPRAY | Freq: Four times a day (QID) | RESPIRATORY_TRACT | 5 refills | Status: DC | PRN

## 2022-05-14 NOTE — Telephone Encounter (Signed)
Called and spoke with pt letting her know that we would get Rx for rescue inhaler sent in for pt and she verbalized understanding. Verified preferred pharmacy and sent Rx in. Nothing further needed.

## 2022-05-15 DIAGNOSIS — R278 Other lack of coordination: Secondary | ICD-10-CM | POA: Diagnosis not present

## 2022-05-15 DIAGNOSIS — R296 Repeated falls: Secondary | ICD-10-CM | POA: Diagnosis not present

## 2022-05-20 DIAGNOSIS — R2689 Other abnormalities of gait and mobility: Secondary | ICD-10-CM | POA: Diagnosis not present

## 2022-05-20 DIAGNOSIS — R296 Repeated falls: Secondary | ICD-10-CM | POA: Diagnosis not present

## 2022-05-25 ENCOUNTER — Ambulatory Visit (AMBULATORY_SURGERY_CENTER): Payer: Medicare Other | Admitting: *Deleted

## 2022-05-25 ENCOUNTER — Encounter: Payer: Self-pay | Admitting: Gastroenterology

## 2022-05-25 VITALS — Ht 66.5 in | Wt 171.0 lb

## 2022-05-25 DIAGNOSIS — Z86010 Personal history of colonic polyps: Secondary | ICD-10-CM

## 2022-05-25 MED ORDER — PEG 3350-KCL-NA BICARB-NACL 420 G PO SOLR: 4000.0000 mL | Freq: Once | ORAL | 0 refills | Status: AC

## 2022-05-25 NOTE — Progress Notes (Signed)
Completed pre visit over telephone. Instructions mailed to patient.    No egg or soy allergy known to patient  No issues known to pt with past sedation with any surgeries or procedures Patient denies ever being told they had issues or difficulty with intubation  No FH of Malignant Hyperthermia Pt is not on diet pills Pt is not on  home 02  Pt is not on blood thinners  Pt denies issues with constipation  No A fib or A flutter Have any cardiac testing pending--NO Pt instructed to use [email or web address] or GoodRx for a price reduction on prep    Patient advised to contact us if any changes to health or new medications.

## 2022-05-27 ENCOUNTER — Ambulatory Visit (INDEPENDENT_AMBULATORY_CARE_PROVIDER_SITE_OTHER): Payer: Medicare Other | Admitting: Family Medicine

## 2022-05-27 ENCOUNTER — Encounter: Payer: Self-pay | Admitting: Family Medicine

## 2022-05-27 VITALS — BP 110/74 | HR 94 | Temp 98.2°F | Ht 66.5 in | Wt 171.6 lb

## 2022-05-27 DIAGNOSIS — J449 Chronic obstructive pulmonary disease, unspecified: Secondary | ICD-10-CM

## 2022-05-27 DIAGNOSIS — I5032 Chronic diastolic (congestive) heart failure: Secondary | ICD-10-CM

## 2022-05-27 DIAGNOSIS — E039 Hypothyroidism, unspecified: Secondary | ICD-10-CM

## 2022-05-27 DIAGNOSIS — M810 Age-related osteoporosis without current pathological fracture: Secondary | ICD-10-CM

## 2022-05-27 DIAGNOSIS — R296 Repeated falls: Secondary | ICD-10-CM

## 2022-05-27 DIAGNOSIS — R600 Localized edema: Secondary | ICD-10-CM

## 2022-05-27 DIAGNOSIS — R278 Other lack of coordination: Secondary | ICD-10-CM | POA: Diagnosis not present

## 2022-05-27 DIAGNOSIS — F339 Major depressive disorder, recurrent, unspecified: Secondary | ICD-10-CM

## 2022-05-27 LAB — BASIC METABOLIC PANEL
BUN: 25 mg/dL — ABNORMAL HIGH (ref 6–23)
CO2: 25 mEq/L (ref 19–32)
Calcium: 9.6 mg/dL (ref 8.4–10.5)
Chloride: 106 mEq/L (ref 96–112)
Creatinine, Ser: 1.14 mg/dL (ref 0.40–1.20)
GFR: 49.22 mL/min — ABNORMAL LOW (ref >60.00)
Glucose, Bld: 109 mg/dL — ABNORMAL HIGH (ref 70–99)
Potassium: 4.2 mEq/L (ref 3.5–5.1)
Sodium: 140 mEq/L (ref 135–145)

## 2022-05-27 LAB — TSH: TSH: 0.14 u[IU]/mL — ABNORMAL LOW (ref 0.35–5.50)
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
## 2022-05-27 NOTE — Progress Notes (Signed)
Subjective  CC:  Chief Complaint  Patient presents with   Hypertension   Hypothyroidism    HPI: April [name] is a 67 y.o. female who presents to the office today to address the problems listed above in the chief complaint. Follow-up hypothyroidism.  Due for recheck after adjusting down dose of levothyroxine to 112 mcg daily.  Clinically she feels well.  Feeling much better, less shaky and anxious COPD, stage III: Reviewed recent pulmonology notes.  Remains on triple inhaler therapy, antibiotics.   Bilateral lower extremity edema due to diastolic heart failure: Doing well with 3-4 times weekly Lasix with potassium.  Responds to elevation and compression stockings.   Chronic depression: Stable on Wellbutrin XL 300 mg daily.  Depression screen is positive today but this is most related to her transition to her new residence.  Does not feel that she has or needs changes in her medications.  Hoping to the transition.  She is adjusting to the changes in her rehab facility. Osteoporosis stable on Boniva Polyps: Scheduled for colonoscopy next month Frequent falls: Reviewed neurology evaluation.  Thorough workup has not shown or revealed any cause.  Has follow-up next month.  Fortunately, since she has improved significantly.  Continue to work with physical therapy.  Assessment  1. Acquired hypothyroidism   2. Bilateral lower extremity edema   3. Chronic diastolic CHF (congestive heart failure) (HCC)   4. COPD, severe (HCC)   5. Chronic recurrent major depressive disorder (HCC)   6. Osteoporosis, unspecified osteoporosis type, unspecified pathological fracture presence   7. Frequent falls      Plan  Hypothyroidism: Clinically much improved.  Recheck TSH today.  Taking 112 mcg daily Edema: Well-controlled with Lasix as needed.  Blood pressure holding steady.  Monitoring renal function and potassium. Depression: Stable on Wellbutrin XL 300 daily. Continue Boniva, next bone density due  2025 Frequent falls: Will follow-up with neurology.  Improving  Follow up: 6 months for complete physical Visit date not found  Orders Placed This Encounter  Procedures   Basic metabolic panel   TSH   No orders of the defined types were placed in this encounter.     I reviewed the patients updated PMH, FH, and SocHx.    Patient Active Problem List   Diagnosis Date Noted   Mixed hyperlipidemia 12/03/2021    Priority: High   Chronic diastolic CHF (congestive heart failure) (HCC) 06/13/2021    Priority: High   COPD, severe (HCC) 11/24/2017    Priority: High   CKD (chronic kidney disease), stage III (HCC) 10/28/2017    Priority: High   Essential hypertension 06/23/2017    Priority: High   Polymyalgia rheumatica (HCC) 05/22/2016    Priority: High   Asthma 03/23/2010    Priority: High   Acquired hypothyroidism 03/21/2010    Priority: High   Chronic recurrent major depressive disorder (HCC) 03/21/2010    Priority: High   Upper airway cough syndrome 05/07/2021    Priority: Medium    Osteoporosis 03/23/2021    Priority: Medium    Aortic atherosclerosis (HCC) 12/17/2017    Priority: Medium    Pulmonary nodule 12/17/2017    Priority: Medium    GERD (gastroesophageal reflux disease) 12/12/2017    Priority: Medium    History of bilateral hip replacements 05/22/2016    Priority: Medium    DDD (degenerative disc disease), lumbar 05/22/2016    Priority: Medium    Former smoker 03/21/2010    Priority: Medium  History of temporal arteritis 03/21/2010    Priority: Medium    Allergic rhinitis 04/06/2019    Priority: Low   History of avascular necrosis of capital femoral epiphysis 08/14/2014    Priority: Low   Bilateral lower extremity edema 05/27/2022   Frequent falls 03/05/2022   Serrated polyp of colon 03/05/2022   Unsteady gait 07/20/2021   Current Meds  Medication Sig   acetaminophen (TYLENOL) 500 MG tablet Take 1,000 mg by mouth every 6 (six) hours as needed for  moderate pain or headache.   albuterol (VENTOLIN HFA) 108 (90 Base) MCG/ACT inhaler Inhale 2 puffs into the lungs every 6 (six) hours as needed for wheezing or shortness of breath. Okay to dispense proair/Ventolin/albuterol.   ALPRAZolam (XANAX) 0.5 MG tablet TAKE 1/2 TO 1 TABLET BY MOUTH TWICE DAILY AS NEEDED FOR ANXIETY   azithromycin (ZITHROMAX) 250 MG tablet Take 1 tablet (250 mg total) by mouth every Monday, Wednesday, and Friday.   Budeson-Glycopyrrol-Formoterol (BREZTRI AEROSPHERE) 160-9-4.8 MCG/ACT AERO Inhale 2 puffs into the lungs every 12 (twelve) hours.   buPROPion (WELLBUTRIN XL) 300 MG 24 hr tablet TAKE 1 TABLET BY MOUTH  DAILY   Calcium 500 MG CHEW Chew 500 mg by mouth 2 (two) times daily.   Cholecalciferol 250 MCG (10000 UT) CAPS Take by mouth.   Cyanocobalamin (VITAMIN B 12 PO) Take by mouth.   furosemide (LASIX) 20 MG tablet Take 1 tablet (20 mg total) by mouth daily as needed for edema. Take with potassium   ibandronate (BONIVA) 150 MG tablet TAKE 1 TABLET BY MOUTH EACH MONTH WITH 8oz OF plain WATER 60 mins BEFORE first food, drink, OR meds. stay upright FOR 60 MINUTES.   levothyroxine (SYNTHROID) 112 MCG tablet Take 1 tablet (112 mcg total) by mouth daily.   metoprolol succinate (TOPROL-XL) 25 MG 24 hr tablet TAKE 1 TABLET BY MOUTH EVERY DAY   omeprazole (PRILOSEC) 20 MG capsule TAKE 1 CAPSULE BY MOUTH EVERY DAY   Polyethyl Glycol-Propyl Glycol (SYSTANE OP) Place 1 drop into both eyes as needed.   potassium chloride SA (KLOR-CON M) 10 MEQ tablet Take 1 tablet (10 mEq total) by mouth daily as needed (leg swelling). Take with furosemide   rosuvastatin (CRESTOR) 10 MG tablet TAKE 1 TABLET BY MOUTH DAILY   Spacer/Aero-Holding Chambers (AEROCHAMBER MV) inhaler Use as instructed   tiZANidine (ZANAFLEX) 4 MG tablet TAKE HALF TO ONE TABLET BY MOUTH EVERY 6 HOURS AS NEEDED FOR MUSCLE SPASMS   valsartan (DIOVAN) 40 MG tablet TAKE 1 TABLET BY MOUTH EVERY DAY   vitamin C (ASCORBIC ACID)  250 MG tablet Take 500 mg by mouth daily.    Allergies: Patient is allergic to sulfonamide derivatives, alendronate sodium, dilaudid [hydromorphone hcl], sulfa antibiotics, other, and wound dressing adhesive. Family History: Patient family history includes Alzheimer's disease in her mother; Arthritis in her brother; Breast cancer in her maternal aunt; Colon cancer in her mother; Colon polyps in her brother; Healthy in her son; Heart disease in her mother; Hypertension in her mother; Kidney failure in her mother; Suicidality in her brother. Social History:  Patient  reports that she quit smoking about 4 years ago. Her smoking use included cigarettes. She has a 11.22 pack-year smoking history. She has been exposed to tobacco smoke. She has never used smokeless tobacco. She reports current alcohol use. She reports that she does not use drugs.  Review of Systems: Constitutional: Negative for fever malaise or anorexia Cardiovascular: negative for chest pain Respiratory: negative for SOB  or persistent cough Gastrointestinal: negative for abdominal pain  Objective  Vitals: BP 110/74   Pulse 94   Temp 98.2 F (36.8 C)   Ht 5' 6.5" (1.689 m)   Wt 171 lb 9.6 oz (77.8 kg)   SpO2 95%   BMI 27.28 kg/m  General: no acute distress , A&Ox3, appears calm and happy today HEENT: PEERL, conjunctiva normal, neck is supple Cardiovascular:  RRR without murmur or gallop.  Respiratory: Distant breath sounds bilaterally, CTAB with normal respiratory effort Skin:  Warm, no rashes Extremities without edema or tremor  Commons side effects, risks, benefits, and alternatives for medications and treatment plan prescribed today were discussed, and the patient expressed understanding of the given instructions. Patient is instructed to call or message via MyChart if he/she has any questions or concerns regarding our treatment plan. No barriers to understanding were identified. We discussed Red Flag symptoms and signs  in detail. Patient expressed understanding regarding what to do in case of urgent or emergency type symptoms.  Medication list was reconciled, printed and provided to the patient in AVS. Patient instructions and summary information was reviewed with the patient as documented in the AVS. This note was prepared with assistance of Dragon voice recognition software. Occasional wrong-word or sound-a-like substitutions may have occurred due to the inherent limitations of voice recognition software

## 2022-05-28 ENCOUNTER — Encounter: Payer: Self-pay | Admitting: Family Medicine

## 2022-05-28 DIAGNOSIS — R2689 Other abnormalities of gait and mobility: Secondary | ICD-10-CM | POA: Diagnosis not present

## 2022-05-28 DIAGNOSIS — R296 Repeated falls: Secondary | ICD-10-CM | POA: Diagnosis not present

## 2022-05-28 NOTE — Progress Notes (Signed)
Please call patient: Thyroid levels are improved but not yet at goal.  Need to lower thyroid medication dose again.  Please stop the 112 mcg daily and start 100 mcg daily.  Only follow-up visit again in 8 weeks, lab visit will be fine.  Please order future TSH for hypothyroidism.  Other lab results are stable.

## 2022-05-29 ENCOUNTER — Other Ambulatory Visit: Payer: Self-pay

## 2022-05-29 DIAGNOSIS — E039 Hypothyroidism, unspecified: Secondary | ICD-10-CM

## 2022-05-29 DIAGNOSIS — R2689 Other abnormalities of gait and mobility: Secondary | ICD-10-CM

## 2022-05-29 DIAGNOSIS — R296 Repeated falls: Secondary | ICD-10-CM

## 2022-05-29 MED ORDER — LEVOTHYROXINE SODIUM 100 MCG PO TABS: 100.0000 ug | ORAL_TABLET | Freq: Every day | ORAL | 3 refills | Status: DC

## 2022-06-02 DIAGNOSIS — R278 Other lack of coordination: Secondary | ICD-10-CM | POA: Diagnosis not present

## 2022-06-02 DIAGNOSIS — R296 Repeated falls: Secondary | ICD-10-CM | POA: Diagnosis not present

## 2022-06-04 ENCOUNTER — Other Ambulatory Visit: Payer: Self-pay

## 2022-06-04 ENCOUNTER — Ambulatory Visit: Payer: Medicare Other | Attending: Family Medicine

## 2022-06-04 ENCOUNTER — Ambulatory Visit: Payer: Medicare Other | Admitting: Family Medicine

## 2022-06-04 DIAGNOSIS — R2681 Unsteadiness on feet: Secondary | ICD-10-CM | POA: Insufficient documentation

## 2022-06-04 DIAGNOSIS — R42 Dizziness and giddiness: Secondary | ICD-10-CM | POA: Insufficient documentation

## 2022-06-04 NOTE — Therapy (Signed)
OUTPATIENT PHYSICAL THERAPY VESTIBULAR EVALUATION     Patient Name: April [name] MRN: [id number] DOB:03/23/1955, 67 y.o., female Today's Date: 06/04/2022  END OF SESSION:  PT End of Session - 06/04/22 0852     Visit Number 1    Number of Visits 6    Date for PT Re-Evaluation 07/16/22    Authorization Type [employer]    Progress Note Due on Visit 10    PT Start Time [phone]    PT Stop Time 0930    PT Time Calculation (min) 39 min             Past Medical History:  Diagnosis Date   Allergy    Anemia    Anxiety    Anxiety and depression    related to caring for mother during terminal illness   Arthritis    Asthma    AVN (avascular necrosis of bone) (HCC)    hip and wrist   Bronchitis    hx of   Cataract    Chronic kidney disease    COPD (chronic obstructive pulmonary disease) (HCC)    Depression    Emphysema of lung (HCC)    Endometriosis    FH: CAD (coronary artery disease)    GERD (gastroesophageal reflux disease)    ocassional   History of blood transfusion    after hip repackment - broke out in hives and started itching   History of palpitations    Hyperlipidemia    Hypertension    Hypothyroid    Neuromuscular disorder (HCC)    Non-ischemic cardiomyopathy (HCC) 2019   Osteopenia    forteo through Dr. Corliss Skains (started 10/12)   Osteoporosis    PMR (polymyalgia rheumatica) (HCC)    SIRS (systemic inflammatory response syndrome) (HCC) 10/2017   Smoker    SOB (shortness of breath) on exertion    Squamous cell carcinoma    facial, 2016   Temporal arteritis (HCC)    s/p prednisone taper   Past Surgical History:  Procedure Laterality Date   ABDOMINAL ADHESION SURGERY     ABDOMINAL HYSTERECTOMY     BREAST EXCISIONAL BIOPSY Left    BREAST REDUCTION SURGERY Bilateral 06/13/2019   Procedure: MAMMARY REDUCTION  (BREAST);  Surgeon: Allena Napoleon, MD;  Location: [facility] OR;  Service: Plastics;  Laterality: Bilateral;   BREAST SURGERY Left     benign bx 1990   CARDIAC CATHETERIZATION  2007   no PCI   CATARACT EXTRACTION Bilateral    CHOLECYSTECTOMY     COLONOSCOPY     COLONOSCOPY W/ POLYPECTOMY     INCONTINENCE SURGERY     2007    JOINT REPLACEMENT Left 2012   left hip   REDUCTION MAMMAPLASTY     2021   REVERSE SHOULDER ARTHROPLASTY Left 05/28/2020   Procedure: REVERSE SHOULDER ARTHROPLASTY;  Surgeon: Yolonda Kida, MD;  Location: [facility] OR;  Service: Orthopedics;  Laterality: Left;  2.5 hrs   TONSILLECTOMY     TOTAL HIP ARTHROPLASTY Right 10/04/2012   Procedure: TOTAL HIP ARTHROPLASTY;  Surgeon: Valeria Batman, MD;  Location: [facility] OR;  Service: Orthopedics;  Laterality: Right;   TOTAL SHOULDER REPLACEMENT Left    WRIST SURGERY Left    2012/ left wrist/ bone removed due to necrosis   Patient Active Problem List   Diagnosis Date Noted   Bilateral lower extremity edema 05/27/2022   Frequent falls 03/05/2022   Serrated polyp of colon 03/05/2022   Mixed hyperlipidemia 12/03/2021  Unsteady gait 07/20/2021   Chronic diastolic CHF (congestive heart failure) (HCC) 06/13/2021   Upper airway cough syndrome 05/07/2021   Osteoporosis 03/23/2021   Allergic rhinitis 04/06/2019   Aortic atherosclerosis (HCC) 12/17/2017   Pulmonary nodule 12/17/2017   GERD (gastroesophageal reflux disease) 12/12/2017   COPD, severe (HCC) 11/24/2017   CKD (chronic kidney disease), stage III (HCC) 10/28/2017   Essential hypertension 06/23/2017   Polymyalgia rheumatica (HCC) 05/22/2016   History of bilateral hip replacements 05/22/2016   DDD (degenerative disc disease), lumbar 05/22/2016   History of avascular necrosis of capital femoral epiphysis 08/14/2014   Asthma 03/23/2010   Acquired hypothyroidism 03/21/2010   Chronic recurrent major depressive disorder (HCC) 03/21/2010   Former smoker 03/21/2010   History of temporal arteritis 03/21/2010    PCP: Willow Ora, MD REFERRING PROVIDER: Willow Ora, MD  REFERRING DIAG:  R26.89 (ICD-10-CM) - Poor balance R29.6 (ICD-10-CM) - Frequent falls  THERAPY DIAG:  Unsteadiness on feet  Dizziness and giddiness  ONSET DATE: 5 years ago to present  Rationale for Evaluation and Treatment: Rehabilitation  SUBJECTIVE:   SUBJECTIVE STATEMENT: Pt reports hx of vertigo starting 5 years ago with recurring pattern and reports most recent episode being Dec-Jan.  Pt reports she had seen a PT at her ILF who performed Epley maneuver which helped resolved the issue.  Continues to have balance impairments and reports multiple falls.  Pt reports that the dizziness has resolved but still feeling unsteady/swimmyheaded with certain positions and quick turns.    Pt accompanied by: self  PERTINENT HISTORY: history of pre-diabetes, B12 deficiency hypothyroidism, COPD (former smoker), HLD, HTN, osteoporosis, temporal arteritis, anxiety and depression  PAIN:  Are you having pain? No  PRECAUTIONS: Fall  WEIGHT BEARING RESTRICTIONS: No  FALLS: Has patient fallen in last 6 months? Yes. Number of falls 4  LIVING ENVIRONMENT: Lives with: lives alone Lives in: [place], [facility] Stairs: Yes: External: flight steps; bilateral but cannot reach both Has following equipment at home: Single point cane and Walker - 4 wheeled  PLOF: Independent, Independent with basic ADLs, Independent with household mobility with device, Independent with community mobility with device, and pt reports she has a caregiver that comes 2-3x/wk to stand-by for showers and light housekeeping  PATIENT GOALS: improve balance, reduce risk for falls  OBJECTIVE:   DIAGNOSTIC FINDINGS:  IMPRESSION: 1. No acute fracture or destructive bone lesion. 2. Multilevel degenerative disc and joint changes as above. 3. Moderate C5-6 and C6-7 central canal stenosis. 4. Multilevel neuroforaminal stenosis including moderate to severe left-greater-than-right C3-4, moderate to severe right and moderate left C4-5, severe right  and moderate left C5-6, and severe right and mild-to-moderate left C6-7 neuroforaminal stenosis.  COGNITION: Overall cognitive status: Within functional limits for tasks assessed   SENSATION: WFL  EDEMA:  Yes, some swelling Left > right. Wearing compression stockings  MUSCLE TONE:  WNL  DTRs:  WNL  POSTURE:  forward head  Cervical ROM:    Active A/PROM (deg) eval  Flexion WNL  Extension 50  Right lateral flexion 25  Left lateral flexion 30  Right rotation 40  Left rotation 50  (Blank rows = not tested)  STRENGTH:   LOWER EXTREMITY MMT:   MMT Right eval Left eval  Hip flexion    Hip abduction    Hip adduction    Hip internal rotation    Hip external rotation    Knee flexion    Knee extension    Ankle dorsiflexion    Ankle  plantarflexion    Ankle inversion    Ankle eversion    (Blank rows = not tested)  BED MOBILITY:  indep  TRANSFERS: Assistive device utilized: Single point cane and None  Sit to stand: Complete Independence Stand to sit: Complete Independence Chair to chair: Modified independence Floor:  NT    GAIT: Gait pattern: decreased stride length Distance walked:  Assistive device utilized: Single point cane Level of assistance: Modified independence Comments:     VESTIBULAR ASSESSMENT:  GENERAL OBSERVATION: wears progressive lenses   SYMPTOM BEHAVIOR:  Subjective history: 5 year hx of vertigo and general unsteadiness  Non-Vestibular symptoms: neck pain, headaches, tinnitus, and migraine symptoms  Type of dizziness: Imbalance (Disequilibrium), Unsteady with head/body turns, and "Swimmyheaded"  Frequency: varies  Duration: seconds, with lingering side effects x days  Aggravating factors: Induced by motion: turning body quickly and turning head quickly and Worse in the dark  Relieving factors: slow movements  Progression of symptoms: better  OCULOMOTOR EXAM:  Ocular Alignment: normal  Ocular ROM: No  Limitations  Spontaneous Nystagmus: absent  Gaze-Induced Nystagmus: absent  Smooth Pursuits:  slow and off target  Saccades: hypometric/undershoots, extra eye movements, and slow  Convergence/Divergence:  very limited left eye convergence    VESTIBULAR - OCULAR REFLEX:   Slow VOR: Positive Bilaterally and Comment: saccadic intrusions horizontal > vertical  VOR Cancellation: Corrective Saccades  Head-Impulse Test: HIT Right: positive HIT Left: positive, left side with greater amplitude  Dynamic Visual Acuity: Not able to be assessed   POSITIONAL TESTING: Pt denies any positional dizziness at this time but notes with sit to stand she experiences lightheadedness.  Reports she has had orthostatics checked at several MD appointments and therapy visits without drop in BP.   MOTION SENSITIVITY:  Motion Sensitivity Quotient Intensity: 0 = none, 1 = Lightheaded, 2 = Mild, 3 = Moderate, 4 = Severe, 5 = Vomiting  Intensity  1. Sitting to supine   2. Supine to L side   3. Supine to R side   4. Supine to sitting   5. L Hallpike-Dix   6. Up from L    7. R Hallpike-Dix   8. Up from R    9. Sitting, head tipped to L knee   10. Head up from L knee   11. Sitting, head tipped to R knee   12. Head up from R knee   13. Sitting head turns x5   14.Sitting head nods x5   15. In stance, 180 turn to L    16. In stance, 180 turn to R     OTHOSTATICS: not done  FUNCTIONAL GAIT: 5 times sit to stand: 18 sec with pushing back of legs on chair Timed up and go (TUG): NT Functional gait assessment: NT  M-CTSIB  Condition 1: Firm Surface, EO 30 Sec, Mild Sway  Condition 2: Firm Surface, EC 30 Sec, Moderate Sway  Condition 3: Foam Surface, EO 30 Sec, Mild Sway  Condition 4: Foam Surface, EC 20 Sec, Moderate and Severe Sway     VESTIBULAR TREATMENT:  DATE: 06/04/22  Canalith Repositioning:  Comment:  not indicated at this time Gaze Adaptation:  x1 Viewing Horizontal: Position: sitting, Time: 15-30 sec, and Reps: 2 Habituation:  Other: TBD Other:   PATIENT EDUCATION: Education details: assessment fidings, rationale of intervention, HEP initiation Person educated: Patient Education method: Explanation and Handouts Education comprehension: verbalized understanding and needs further education  HOME EXERCISE PROGRAM: Access Code: [email or web address] URL: [email or web address] Date: 06/04/2022 Prepared by: Shary Decamp  Exercises - Seated Gaze Stabilization with Head Rotation  - 1 x daily - 7 x weekly - 3-5 sets - 15-30 sec hold  GOALS: Goals reviewed with patient? Yes  SHORT TERM GOALS: Target date: 06/25/2022    The patient will be independent with HEP for gaze adaptation, habituation, balance, and general mobility. Baseline: Goal status: INITIAL    LONG TERM GOALS: Target date: 07/16/2022    Demo improved balance and BLE strength per time 15 sec 5xSTS test Baseline: 18 sec w/ compensation Goal status: INITIAL  2.  Manifest low risk for falls and improved mobility per score 22/28 Functional Gait Assessment Baseline: TBD Goal status: INITIAL  3.  Demo improved postural stability and control maintaining normal-mild sway condition 4 M-CTSIB to improve safety with ADL Baseline: mod-severe x 20 sec Goal status: INITIAL  4.  Demo lower risk for falls per time 15 sec TUG test to improve safety with mobility Baseline: TBD Goal status: INITIAL    ASSESSMENT:  CLINICAL IMPRESSION: Patient is a 67 y.o. lady who was seen today for physical therapy evaluation and treatment for chronic dizziness, unsteadiness on feet, and history of falling.  Pt demonstrates signs and symptoms consistent with vestibular hypofunction with difficulty and impairment in dynamic visual acuity and VOR impairments as well as exhibiting balance and postural instability issues.  Pt would benefit  from PT sessions to address deficits and limitations to reduce risk for falls and enhance functional mobility   OBJECTIVE IMPAIRMENTS: Abnormal gait, decreased activity tolerance, decreased balance, decreased mobility, decreased ROM, decreased strength, dizziness, and postural dysfunction.   ACTIVITY LIMITATIONS: lifting, bending, standing, transfers, reach over head, and locomotion level  PARTICIPATION LIMITATIONS: meal prep, cleaning, driving, shopping, and community activity  PERSONAL FACTORS: Age, Time since onset of injury/illness/exacerbation, and 1-2 comorbidities: PMH  are also affecting patient's functional outcome.   REHAB POTENTIAL: Good  CLINICAL DECISION MAKING: Evolving/moderate complexity  EVALUATION COMPLEXITY: Moderate   PLAN:  PT FREQUENCY: 1x/week  PT DURATION: 6 weeks  PLANNED INTERVENTIONS: Therapeutic exercises, Therapeutic activity, Neuromuscular re-education, Balance training, Gait training, Patient/Family education, Self Care, Joint mobilization, Stair training, Vestibular training, Canalith repositioning, Orthotic/Fit training, DME instructions, Aquatic Therapy, Dry Needling, Electrical stimulation, Spinal mobilization, Taping, and Manual therapy  PLAN FOR NEXT SESSION: Functional Gait Assessment, TUG test, HEP review and additions   1:08 PM, 06/04/22 M. Shary Decamp, PT, DPT Physical Therapist- [place] Office Number: [phone]

## 2022-06-09 ENCOUNTER — Ambulatory Visit: Payer: Medicare Other | Attending: Family Medicine | Admitting: Physical Therapy

## 2022-06-09 ENCOUNTER — Encounter: Payer: Self-pay | Admitting: Physical Therapy

## 2022-06-09 DIAGNOSIS — R2681 Unsteadiness on feet: Secondary | ICD-10-CM | POA: Diagnosis not present

## 2022-06-09 DIAGNOSIS — R296 Repeated falls: Secondary | ICD-10-CM | POA: Diagnosis not present

## 2022-06-09 DIAGNOSIS — R42 Dizziness and giddiness: Secondary | ICD-10-CM | POA: Insufficient documentation

## 2022-06-09 DIAGNOSIS — R278 Other lack of coordination: Secondary | ICD-10-CM | POA: Diagnosis not present

## 2022-06-09 NOTE — Therapy (Signed)
OUTPATIENT PHYSICAL THERAPY VESTIBULAR TREATMENT     Patient Name: April [name] MRN: [id number] DOB:November 06, 1955, 67 y.o., female Today's Date: 06/09/2022  END OF SESSION:  PT End of Session - 06/09/22 1320     Visit Number 2    Number of Visits 6    Date for PT Re-Evaluation 07/16/22    Authorization Type [employer]    Progress Note Due on Visit 10    PT Start Time 1231    PT Stop Time 1312    PT Time Calculation (min) 41 min    Activity Tolerance Patient tolerated treatment well    Behavior During Therapy WFL for tasks assessed/performed              Past Medical History:  Diagnosis Date   Allergy    Anemia    Anxiety    Anxiety and depression    related to caring for mother during terminal illness   Arthritis    Asthma    AVN (avascular necrosis of bone) (HCC)    hip and wrist   Bronchitis    hx of   Cataract    Chronic kidney disease    COPD (chronic obstructive pulmonary disease) (HCC)    Depression    Emphysema of lung (HCC)    Endometriosis    FH: CAD (coronary artery disease)    GERD (gastroesophageal reflux disease)    ocassional   History of blood transfusion    after hip repackment - broke out in hives and started itching   History of palpitations    Hyperlipidemia    Hypertension    Hypothyroid    Neuromuscular disorder (HCC)    Non-ischemic cardiomyopathy (HCC) 2019   Osteopenia    forteo through Dr. Corliss Skains (started 10/12)   Osteoporosis    PMR (polymyalgia rheumatica) (HCC)    SIRS (systemic inflammatory response syndrome) (HCC) 10/2017   Smoker    SOB (shortness of breath) on exertion    Squamous cell carcinoma    facial, 2016   Temporal arteritis (HCC)    s/p prednisone taper   Past Surgical History:  Procedure Laterality Date   ABDOMINAL ADHESION SURGERY     ABDOMINAL HYSTERECTOMY     BREAST EXCISIONAL BIOPSY Left    BREAST REDUCTION SURGERY Bilateral 06/13/2019   Procedure: MAMMARY REDUCTION  (BREAST);   Surgeon: Allena Napoleon, MD;  Location: [facility] OR;  Service: Plastics;  Laterality: Bilateral;   BREAST SURGERY Left    benign bx 1990   CARDIAC CATHETERIZATION  2007   no PCI   CATARACT EXTRACTION Bilateral    CHOLECYSTECTOMY     COLONOSCOPY     COLONOSCOPY W/ POLYPECTOMY     INCONTINENCE SURGERY     2007    JOINT REPLACEMENT Left 2012   left hip   REDUCTION MAMMAPLASTY     2021   REVERSE SHOULDER ARTHROPLASTY Left 05/28/2020   Procedure: REVERSE SHOULDER ARTHROPLASTY;  Surgeon: Yolonda Kida, MD;  Location: [facility] OR;  Service: Orthopedics;  Laterality: Left;  2.5 hrs   TONSILLECTOMY     TOTAL HIP ARTHROPLASTY Right 10/04/2012   Procedure: TOTAL HIP ARTHROPLASTY;  Surgeon: Valeria Batman, MD;  Location: [facility] OR;  Service: Orthopedics;  Laterality: Right;   TOTAL SHOULDER REPLACEMENT Left    WRIST SURGERY Left    2012/ left wrist/ bone removed due to necrosis   Patient Active Problem List   Diagnosis Date Noted   Bilateral lower extremity edema  05/27/2022   Frequent falls 03/05/2022   Serrated polyp of colon 03/05/2022   Mixed hyperlipidemia 12/03/2021   Unsteady gait 07/20/2021   Chronic diastolic CHF (congestive heart failure) (HCC) 06/13/2021   Upper airway cough syndrome 05/07/2021   Osteoporosis 03/23/2021   Allergic rhinitis 04/06/2019   Aortic atherosclerosis (HCC) 12/17/2017   Pulmonary nodule 12/17/2017   GERD (gastroesophageal reflux disease) 12/12/2017   COPD, severe (HCC) 11/24/2017   CKD (chronic kidney disease), stage III (HCC) 10/28/2017   Essential hypertension 06/23/2017   Polymyalgia rheumatica (HCC) 05/22/2016   History of bilateral hip replacements 05/22/2016   DDD (degenerative disc disease), lumbar 05/22/2016   History of avascular necrosis of capital femoral epiphysis 08/14/2014   Asthma 03/23/2010   Acquired hypothyroidism 03/21/2010   Chronic recurrent major depressive disorder (HCC) 03/21/2010   Former smoker 03/21/2010   History of  temporal arteritis 03/21/2010    PCP: Willow Ora, MD REFERRING PROVIDER: Willow Ora, MD  REFERRING DIAG: R26.89 (ICD-10-CM) - Poor balance R29.6 (ICD-10-CM) - Frequent falls  THERAPY DIAG:  Unsteadiness on feet  Dizziness and giddiness  ONSET DATE: 5 years ago to present  Rationale for Evaluation and Treatment: Rehabilitation  SUBJECTIVE:   SUBJECTIVE STATEMENT:  Feeling the same, nothing new. Took a shower by myself the other day with someone on stand by just in case. Feel light headed when I get up, can also feel light headed when sitting too.   Pt accompanied by: self  PERTINENT HISTORY: history of pre-diabetes, B12 deficiency hypothyroidism, COPD (former smoker), HLD, HTN, osteoporosis, temporal arteritis, anxiety and depression  PAIN:  Are you having pain? No  PRECAUTIONS: Fall  WEIGHT BEARING RESTRICTIONS: No  FALLS: Has patient fallen in last 6 months? Yes. Number of falls 4  LIVING ENVIRONMENT: Lives with: lives alone Lives in: [place], [place] Stairs: Yes: External: flight steps; bilateral but cannot reach both Has following equipment at home: Single point cane and Walker - 4 wheeled  PLOF: Independent, Independent with basic ADLs, Independent with household mobility with device, Independent with community mobility with device, and pt reports she has a caregiver that comes 2-3x/wk to stand-by for showers and light housekeeping  PATIENT GOALS: improve balance, reduce risk for falls  OBJECTIVE:   DIAGNOSTIC FINDINGS:  IMPRESSION: 1. No acute fracture or destructive bone lesion. 2. Multilevel degenerative disc and joint changes as above. 3. Moderate C5-6 and C6-7 central canal stenosis. 4. Multilevel neuroforaminal stenosis including moderate to severe left-greater-than-right C3-4, moderate to severe right and moderate left C4-5, severe right and moderate left C5-6, and severe right and mild-to-moderate left C6-7 neuroforaminal  stenosis.  COGNITION: Overall cognitive status: Within functional limits for tasks assessed   SENSATION: WFL  EDEMA:  Yes, some swelling Left > right. Wearing compression stockings  MUSCLE TONE:  WNL  DTRs:  WNL  POSTURE:  forward head  Cervical ROM:    Active A/PROM (deg) eval  Flexion WNL  Extension 50  Right lateral flexion 25  Left lateral flexion 30  Right rotation 40  Left rotation 50  (Blank rows = not tested)  STRENGTH:   LOWER EXTREMITY MMT:   MMT Right eval Left eval  Hip flexion    Hip abduction    Hip adduction    Hip internal rotation    Hip external rotation    Knee flexion    Knee extension    Ankle dorsiflexion    Ankle plantarflexion    Ankle inversion    Ankle eversion    (  Blank rows = not tested)  BED MOBILITY:  indep  TRANSFERS: Assistive device utilized: Single point cane and None  Sit to stand: Complete Independence Stand to sit: Complete Independence Chair to chair: Modified independence Floor:  NT    GAIT: Gait pattern: decreased stride length Distance walked:  Assistive device utilized: Single point cane Level of assistance: Modified independence Comments:     VESTIBULAR ASSESSMENT:  GENERAL OBSERVATION: wears progressive lenses   SYMPTOM BEHAVIOR:  Subjective history: 5 year hx of vertigo and general unsteadiness  Non-Vestibular symptoms: neck pain, headaches, tinnitus, and migraine symptoms  Type of dizziness: Imbalance (Disequilibrium), Unsteady with head/body turns, and "Swimmyheaded"  Frequency: varies  Duration: seconds, with lingering side effects x days  Aggravating factors: Induced by motion: turning body quickly and turning head quickly and Worse in the dark  Relieving factors: slow movements  Progression of symptoms: better  OCULOMOTOR EXAM:  Ocular Alignment: normal  Ocular ROM: No Limitations  Spontaneous Nystagmus: absent  Gaze-Induced Nystagmus: absent  Smooth Pursuits:  slow and off  target  Saccades: hypometric/undershoots, extra eye movements, and slow  Convergence/Divergence:  very limited left eye convergence    VESTIBULAR - OCULAR REFLEX:   Slow VOR: Positive Bilaterally and Comment: saccadic intrusions horizontal > vertical  VOR Cancellation: Corrective Saccades  Head-Impulse Test: HIT Right: positive HIT Left: positive, left side with greater amplitude  Dynamic Visual Acuity: Not able to be assessed   POSITIONAL TESTING: Pt denies any positional dizziness at this time but notes with sit to stand she experiences lightheadedness.  Reports she has had orthostatics checked at several MD appointments and therapy visits without drop in BP.   MOTION SENSITIVITY:  Motion Sensitivity Quotient Intensity: 0 = none, 1 = Lightheaded, 2 = Mild, 3 = Moderate, 4 = Severe, 5 = Vomiting  Intensity  1. Sitting to supine   2. Supine to L side   3. Supine to R side   4. Supine to sitting   5. L Hallpike-Dix   6. Up from L    7. R Hallpike-Dix   8. Up from R    9. Sitting, head tipped to L knee   10. Head up from L knee   11. Sitting, head tipped to R knee   12. Head up from R knee   13. Sitting head turns x5   14.Sitting head nods x5   15. In stance, 180 turn to L    16. In stance, 180 turn to R     OTHOSTATICS: not done  FUNCTIONAL GAIT: 5 times sit to stand: 18 sec with pushing back of legs on chair Timed up and go (TUG): NT Functional gait assessment: NT  06/09/22- TUG 12.6 seconds, FGA 17  M-CTSIB  Condition 1: Firm Surface, EO 30 Sec, Mild Sway  Condition 2: Firm Surface, EC 30 Sec, Moderate Sway  Condition 3: Foam Surface, EO 30 Sec, Mild Sway  Condition 4: Foam Surface, EC 20 Sec, Moderate and Severe Sway     VESTIBULAR TREATMENT:  DATE:   06/09/22  NMR  TUG 12.6 seconds no device FGA  17/30 Tandem stance solid surface 3x30 seconds  SLS with one  foot on 4 inch box 3x30 seconds B solid surface  EC on blue foam pad 3x30 seconds up to MinA  Worked on ankle strategy/balance recovery- posterior "falls" onto door using ankles and hips to come back to upright  Posterior step strategy x3 B Mod cues/intermittent ModA for safety   06/04/22  Canalith Repositioning:  Comment: not indicated at this time Gaze Adaptation:  x1 Viewing Horizontal: Position: sitting, Time: 15-30 sec, and Reps: 2 Habituation:  Other: TBD Other:   PATIENT EDUCATION: Education details: assessment fidings, rationale of intervention, HEP initiation Person educated: Patient Education method: Explanation and Handouts Education comprehension: verbalized understanding and needs further education  HOME EXERCISE PROGRAM: Access Code: [email or web address] URL: [email or web address] Date: 06/04/2022 Prepared by: Shary Decamp  Exercises - Seated Gaze Stabilization with Head Rotation  - 1 x daily - 7 x weekly - 3-5 sets - 15-30 sec hold  GOALS: Goals reviewed with patient? Yes  SHORT TERM GOALS: Target date: 06/25/2022    The patient will be independent with HEP for gaze adaptation, habituation, balance, and general mobility. Baseline: Goal status: INITIAL    LONG TERM GOALS: Target date: 07/16/2022    Demo improved balance and BLE strength per time 15 sec 5xSTS test Baseline: 18 sec w/ compensation Goal status: INITIAL  2.  Manifest low risk for falls and improved mobility per score 22/28 Functional Gait Assessment Baseline: TBD Goal status: INITIAL  3.  Demo improved postural stability and control maintaining normal-mild sway condition 4 M-CTSIB to improve safety with ADL Baseline: mod-severe x 20 sec Goal status: INITIAL  4.  Demo lower risk for falls per time 15 sec TUG test to improve safety with mobility Baseline: TBD Goal status: MET 06/09/22    ASSESSMENT:  CLINICAL IMPRESSION:  Ms. Bressette arrives today doing OK, no major changes  since last session. Completed TUG and FGA assessments as per POC, then continued focusing on functional balance as tolerated. Very unsteady even on solid surfaces, unstable surfaces such as foam pads were very difficult today, also had quite a bit of difficulty with balance reaction skills. Will continue to progress as able and tolerated.   OBJECTIVE IMPAIRMENTS: Abnormal gait, decreased activity tolerance, decreased balance, decreased mobility, decreased ROM, decreased strength, dizziness, and postural dysfunction.   ACTIVITY LIMITATIONS: lifting, bending, standing, transfers, reach over head, and locomotion level  PARTICIPATION LIMITATIONS: meal prep, cleaning, driving, shopping, and community activity  PERSONAL FACTORS: Age, Time since onset of injury/illness/exacerbation, and 1-2 comorbidities: PMH  are also affecting patient's functional outcome.   REHAB POTENTIAL: Good  CLINICAL DECISION MAKING: Evolving/moderate complexity  EVALUATION COMPLEXITY: Moderate   PLAN:  PT FREQUENCY: 1x/week  PT DURATION: 6 weeks  PLANNED INTERVENTIONS: Therapeutic exercises, Therapeutic activity, Neuromuscular re-education, Balance training, Gait training, Patient/Family education, Self Care, Joint mobilization, Stair training, Vestibular training, Canalith repositioning, Orthotic/Fit training, DME instructions, Aquatic Therapy, Dry Needling, Electrical stimulation, Spinal mobilization, Taping, and Manual therapy  PLAN FOR NEXT SESSION: HEP review and additions, keep working on balance reaction skills, balance progressions    Nedra Hai PT DPT PN2   Physical TherapistDolores Lory Office Number: [phone]

## 2022-06-10 ENCOUNTER — Ambulatory Visit: Payer: Medicare Other | Admitting: Neurology

## 2022-06-10 ENCOUNTER — Encounter: Payer: Self-pay | Admitting: Neurology

## 2022-06-10 VITALS — BP 113/76 | HR 72 | Ht 65.0 in | Wt 170.6 lb

## 2022-06-10 DIAGNOSIS — E538 Deficiency of other specified B group vitamins: Secondary | ICD-10-CM | POA: Diagnosis not present

## 2022-06-10 DIAGNOSIS — R269 Unspecified abnormalities of gait and mobility: Secondary | ICD-10-CM

## 2022-06-10 DIAGNOSIS — R2681 Unsteadiness on feet: Secondary | ICD-10-CM | POA: Diagnosis not present

## 2022-06-10 NOTE — Patient Instructions (Signed)
Continue B12 1000 mcg daily.  Continue physical therapy.  I want to see you back in clinic in 6 months or sooner if needed.  The physicians and staff at [facility] are committed to providing excellent care. You may receive a survey requesting feedback about your experience at our office. We strive to receive "very good" responses to the survey questions. If you feel that your experience would prevent you from giving the office a "very good " response, please contact our office to try to remedy the situation. We may be reached at [phone]. Thank you for taking the time out of your busy day to complete the survey.  April Balint, MD [facility]  Preventing Falls at [facility] are common, often dreaded events in the lives of older people. Aside from the obvious injuries and even death that may result, fall can cause wide-ranging consequences including loss of independence, mental decline, decreased activity and mobility. Younger people are also at risk of falling, especially those with chronic illnesses and fatigue.  Ways to reduce risk for falling Examine diet and medications. Warm foods and alcohol dilate blood vessels, which can lead to dizziness when standing. Sleep aids, antidepressants and pain medications can also increase the likelihood of a fall.  Get a vision exam. Poor vision, cataracts and glaucoma increase the chances of falling.  Check foot gear. Shoes should fit snugly and have a sturdy, nonskid sole and a broad, low heel  Participate in a physician-approved exercise program to build and maintain muscle strength and improve balance and coordination. Programs that use ankle weights or stretch bands are excellent for muscle-strengthening. Water aerobics programs and low-impact Tai Chi programs have also been shown to improve balance and coordination.  Increase vitamin D intake. Vitamin D improves muscle strength and increases the amount of calcium the body is able to  absorb and deposit in bones.  How to prevent falls from common hazards Floors - Remove all loose wires, cords, and throw rugs. Minimize clutter. Make sure rugs are anchored and smooth. Keep furniture in its usual place.  Chairs -- Use chairs with straight backs, armrests and firm seats. Add firm cushions to existing pieces to add height.  Bathroom - Install grab bars and non-skid tape in the tub or shower. Use a bathtub transfer bench or a shower chair with a back support Use an elevated toilet seat and/or safety rails to assist standing from a low surface. Do not use towel racks or bathroom tissue holders to help you stand.  Lighting - Make sure halls, stairways, and entrances are well-lit. Install a night light in your bathroom or hallway. Make sure there is a light switch at the top and bottom of the staircase. Turn lights on if you get up in the middle of the night. Make sure lamps or light switches are within reach of the bed if you have to get up during the night.  Kitchen - Install non-skid rubber mats near the sink and stove. Clean spills immediately. Store frequently used utensils, pots, pans between waist and eye level. This helps prevent reaching and bending. Sit when getting things out of lower cupboards.  Living room/ Bedrooms - Place furniture with wide spaces in between, giving enough room to move around. Establish a route through the living room that gives you something to hold onto as you walk.  Stairs - Make sure treads, rails, and rugs are secure. Install a rail on both sides of the stairs. If stairs are  a threat, it might be helpful to arrange most of your activities on the lower level to reduce the number of times you must climb the stairs.  Entrances and doorways - Install metal handles on the walls adjacent to the doorknobs of all doors to make it more secure as you travel through the doorway.  Tips for maintaining balance Keep at least one hand free at all times. Try using  a backpack or fanny pack to hold things rather than carrying them in your hands. Never carry objects in both hands when walking as this interferes with keeping your balance.  Attempt to swing both arms from front to back while walking. This might require a conscious effort if Parkinson's disease has diminished your movement. It will, however, help you to maintain balance and posture, and reduce fatigue.  Consciously lift your feet off of the ground when walking. Shuffling and dragging of the feet is a common culprit in losing your balance.  When trying to navigate turns, use a "U" technique of facing forward and making a wide turn, rather than pivoting sharply.  Try to stand with your feet shoulder-length apart. When your feet are close together for any length of time, you increase your risk of losing your balance and falling.  Do one thing at a time. Don't try to walk and accomplish another task, such as reading or looking around. The decrease in your automatic reflexes complicates motor function, so the less distraction, the better.  Do not wear rubber or gripping soled shoes, they might "catch" on the floor and cause tripping.  Move slowly when changing positions. Use deliberate, concentrated movements and, if needed, use a grab bar or walking aid. Count 15 seconds between each movement. For example, when rising from a seated position, wait 15 seconds after standing to begin walking.  If balance is a continuous problem, you might want to consider a walking aid such as a cane, walking stick, or walker. Once you've mastered walking with help, you might be ready to try it on your own again.

## 2022-06-11 ENCOUNTER — Encounter: Payer: Self-pay | Admitting: Gastroenterology

## 2022-06-11 ENCOUNTER — Ambulatory Visit (AMBULATORY_SURGERY_CENTER): Payer: Medicare Other | Admitting: Gastroenterology

## 2022-06-11 VITALS — BP 100/67 | HR 96 | Temp 98.6°F | Resp 17 | Ht 66.5 in

## 2022-06-11 DIAGNOSIS — J45909 Unspecified asthma, uncomplicated: Secondary | ICD-10-CM | POA: Diagnosis not present

## 2022-06-11 DIAGNOSIS — D124 Benign neoplasm of descending colon: Secondary | ICD-10-CM | POA: Diagnosis not present

## 2022-06-11 DIAGNOSIS — K635 Polyp of colon: Secondary | ICD-10-CM | POA: Diagnosis not present

## 2022-06-11 DIAGNOSIS — D125 Benign neoplasm of sigmoid colon: Secondary | ICD-10-CM

## 2022-06-11 DIAGNOSIS — F32A Depression, unspecified: Secondary | ICD-10-CM | POA: Diagnosis not present

## 2022-06-11 DIAGNOSIS — E039 Hypothyroidism, unspecified: Secondary | ICD-10-CM | POA: Diagnosis not present

## 2022-06-11 DIAGNOSIS — J449 Chronic obstructive pulmonary disease, unspecified: Secondary | ICD-10-CM | POA: Diagnosis not present

## 2022-06-11 DIAGNOSIS — Z800 Family history of malignant neoplasm of digestive organs: Secondary | ICD-10-CM

## 2022-06-11 DIAGNOSIS — Z86010 Personal history of colonic polyps: Secondary | ICD-10-CM | POA: Diagnosis not present

## 2022-06-11 DIAGNOSIS — Z09 Encounter for follow-up examination after completed treatment for conditions other than malignant neoplasm: Secondary | ICD-10-CM | POA: Diagnosis not present

## 2022-06-11 MED ORDER — SODIUM CHLORIDE 0.9 % IV SOLN: 500.0000 mL | Freq: Once | INTRAVENOUS | Status: DC

## 2022-06-11 NOTE — Progress Notes (Signed)
Vss nad trans to pacu 

## 2022-06-11 NOTE — Op Note (Signed)
[facility] Patient Name: Marcos Cheeves Procedure Date: 06/11/2022 9:41 AM MRN: [id number] Endoscopist: Sherilyn Cooter L. Myrtie Neither , MD, [id number] Age: 67 Referring MD:  Date of Birth: [birth year] Gender: Female Account #: [id number] Procedure:                Colonoscopy Indications:              Screening in patient at increased risk: Colorectal                            cancer in mother before age 84, Incidental -                            Serrated polyposis syndrome                           September 2018: 15 mm ascending colon SSP, 4                            transverse colon tubular adenomas                           February 2022: 20 mm transverse colon SSP, 35 mm                            descending colon SSP, diminutive cecal tubular                            adenoma                           November 2022: 2 hepatic flexure subcentimeter                            SSP's, 10 mm ascending colon SSP, 18 mm distal                            transverse colon SSP, 10 mm distal transverse colon                            SSP, 20 mm descending colon SSP, 2 additional                            pieces of SSP at prior polypectomy sites Medicines:                Monitored Anesthesia Care Procedure:                Pre-Anesthesia Assessment:                           - Prior to the procedure, a History and Physical                            was performed, and patient medications and  allergies were reviewed. The patient's tolerance of                            previous anesthesia was also reviewed. The risks                            and benefits of the procedure and the sedation                            options and risks were discussed with the patient.                            All questions were answered, and informed consent                            was obtained. Prior Anticoagulants: The patient has                            taken no  anticoagulant or antiplatelet agents. ASA                            Grade Assessment: III - A patient with severe                            systemic disease. After reviewing the risks and                            benefits, the patient was deemed in satisfactory                            condition to undergo the procedure.                           After obtaining informed consent, the colonoscope                            was passed under direct vision. Throughout the                            procedure, the patient's blood pressure, pulse, and                            oxygen saturations were monitored continuously. The                            PCF-HQ190L Colonoscope [id number] was introduced                            through the anus and advanced to the the cecum,                            identified by appendiceal orifice and ileocecal  valve. The colonoscopy was somewhat difficult due                            to multiple diverticula in the colon, a redundant                            colon and a tortuous colon. Successful completion                            of the procedure was aided by using manual pressure                            and straightening and shortening the scope to                            obtain bowel loop reduction. The patient tolerated                            the procedure well. The quality of the bowel                            preparation was excellent. The ileocecal valve,                            appendiceal orifice, and rectum were photographed.                            The bowel preparation used was GoLYTELY via split                            dose instruction. Scope In: 9:56:48 AM Scope Out: 10:36:49 AM Scope Withdrawal Time: 0 hours 31 minutes 7 seconds  Total Procedure Duration: 0 hours 40 minutes 1 second  Findings:                 The perianal and digital rectal examinations were                             normal.                           Many diverticula were found in the left colon.                           Repeat examination of right colon under NBI                            performed.(NBI also used as needed throughout the                            remainder of the colon)                           A tattoo was seen in the transverse colon. The  tattoo site appeared normal. (Adjacent scar from                            prior polypectomy, no no polyp tissue at that site)                           A 5 mm polyp was found in the descending colon. The                            polyp was semi-sessile and on a scar from prior                            polypectomy. The polyp was removed with a piecemeal                            technique using a cold biopsy forceps. Resection                            and retrieval were complete. There was another post                            polypectomy scar in the descending colon with no                            polyp tissue seen at it.                           A 15 mm polyp was found in the sigmoid colon. The                            polyp was sessile and semi-sessile. The polyp was                            removed with a piecemeal technique using a hot                            snare. Resection and retrieval were complete.                            (Polyp was adjacent to a diverticulum) Area was                            tattooed with an injection of 0.5 mL of Spot                            (carbon black) -just distal to the polypectomy                            site-see photo.                           The exam was otherwise without abnormality on  direct and retroflexion views. Complications:            No immediate complications. Estimated Blood Loss:     Estimated blood loss was minimal. Impression:               - Diverticulosis in the left colon.                           -  A tattoo was seen in the transverse colon. The                            tattoo site appeared normal.                           - One 5 mm polyp in the descending colon, removed                            piecemeal using a cold biopsy forceps. Resected and                            retrieved.                           - One 15 mm polyp in the sigmoid colon, removed                            piecemeal using a hot snare. Resected and                            retrieved. Tattooed.                           - The examination was otherwise normal on direct                            and retroflexion views. Recommendation:           - Patient has a contact number available for                            emergencies. The signs and symptoms of potential                            delayed complications were discussed with the                            patient. Return to normal activities tomorrow.                            Written discharge instructions were provided to the                            patient.                           - Resume previous diet.                           -  Continue present medications.                           - Await pathology results.                           - Repeat colonoscopy is recommended for                            surveillance. The colonoscopy date will be                            determined after pathology results from today's                            exam become available for review. Elayne Gruver L. Myrtie Neither, MD 06/11/2022 10:51:46 AM This report has been signed electronically.

## 2022-06-11 NOTE — Progress Notes (Signed)
History and Physical:  This patient presents for endoscopic testing for: Encounter Diagnoses  Name Primary?   Personal history of colonic polyps Yes   Family history of colon cancer in mother   CRC mother < age 69  Serrated polyposis syndrome Tally:  10/01/2016: 15 mm ascending colon SSP, 4 mm transverse colon TA  02/26/2020: 20 mm transverse colon SSP, 35 mm descending colon SSP, diminutive cecal TA  11/2020 hep flex SSP <30mm x 2; 8mm TC SSP; 18mm TC SSP; 10mm TC SSP   12/04/2020:   2 hepatic flexure SSP, less than 10 mm 18 mm distal transverse colon SSP 10 mm distal transverse colon SSP 20 mm descending colon SSP 10 mm ascending colon SSP 2 additional residual pieces of SSP polyp from prior polypectomy sites  Patient denies chronic abdominal pain, rectal bleeding, constipation or diarrhea.   Patient is otherwise without complaints or active issues today.   Past Medical History: Past Medical History:  Diagnosis Date   Allergy    Anemia    Anxiety    Anxiety and depression    related to caring for mother during terminal illness   Arthritis    Asthma    AVN (avascular necrosis of bone) (HCC)    hip and wrist   Bronchitis    hx of   Cataract    Chronic kidney disease    COPD (chronic obstructive pulmonary disease) (HCC)    Depression    Emphysema of lung (HCC)    Endometriosis    FH: CAD (coronary artery disease)    GERD (gastroesophageal reflux disease)    ocassional   History of blood transfusion    after hip repackment - broke out in hives and started itching   History of palpitations    Hyperlipidemia    Hypertension    Hypothyroid    Neuromuscular disorder (HCC)    Non-ischemic cardiomyopathy (HCC) 2019   Osteopenia    forteo through Dr. Corliss Skains (started 10/12)   Osteoporosis    PMR (polymyalgia rheumatica) (HCC)    SIRS (systemic inflammatory response syndrome) (HCC) 10/2017   Smoker    SOB (shortness of breath) on exertion    Squamous cell  carcinoma    facial, 2016   Temporal arteritis (HCC)    s/p prednisone taper     Past Surgical History: Past Surgical History:  Procedure Laterality Date   ABDOMINAL ADHESION SURGERY     ABDOMINAL HYSTERECTOMY     BREAST EXCISIONAL BIOPSY Left    BREAST REDUCTION SURGERY Bilateral 06/13/2019   Procedure: MAMMARY REDUCTION  (BREAST);  Surgeon: Allena Napoleon, MD;  Location: [facility] OR;  Service: Plastics;  Laterality: Bilateral;   BREAST SURGERY Left    benign bx 1990   CARDIAC CATHETERIZATION  2007   no PCI   CATARACT EXTRACTION Bilateral    CHOLECYSTECTOMY     COLONOSCOPY     COLONOSCOPY W/ POLYPECTOMY     INCONTINENCE SURGERY     2007    JOINT REPLACEMENT Left 2012   left hip   REDUCTION MAMMAPLASTY     2021   REVERSE SHOULDER ARTHROPLASTY Left 05/28/2020   Procedure: REVERSE SHOULDER ARTHROPLASTY;  Surgeon: Yolonda Kida, MD;  Location: [facility] OR;  Service: Orthopedics;  Laterality: Left;  2.5 hrs   TONSILLECTOMY     TOTAL HIP ARTHROPLASTY Right 10/04/2012   Procedure: TOTAL HIP ARTHROPLASTY;  Surgeon: Valeria Batman, MD;  Location: [facility] OR;  Service: Orthopedics;  Laterality: Right;  TOTAL SHOULDER REPLACEMENT Left    WRIST SURGERY Left    2012/ left wrist/ bone removed due to necrosis    Allergies: Allergies  Allergen Reactions   Sulfonamide Derivatives     As a child.  Throat closed, rash.   Alendronate Sodium     GI upset   Dilaudid [Hydromorphone Hcl] Nausea And Vomiting   Sulfa Antibiotics Hives   Other Other (See Comments)    Adhesive    Wound Dressing Adhesive Other (See Comments)    Redness, adhesive tapes. Needs PAPER TAPE.    Outpatient Meds: Current Outpatient Medications  Medication Sig Dispense Refill   albuterol (VENTOLIN HFA) 108 (90 Base) MCG/ACT inhaler Inhale 2 puffs into the lungs every 6 (six) hours as needed for wheezing or shortness of breath. Okay to dispense proair/Ventolin/albuterol. 8 g 5   ALPRAZolam (XANAX) 0.5 MG tablet  TAKE 1/2 TO 1 TABLET BY MOUTH TWICE DAILY AS NEEDED FOR ANXIETY 60 tablet 2   azithromycin (ZITHROMAX) 250 MG tablet Take 1 tablet (250 mg total) by mouth every Monday, Wednesday, and Friday. 36 tablet 3   Budeson-Glycopyrrol-Formoterol (BREZTRI AEROSPHERE) 160-9-4.8 MCG/ACT AERO Inhale 2 puffs into the lungs every 12 (twelve) hours. 32.1 g 3   buPROPion (WELLBUTRIN XL) 300 MG 24 hr tablet TAKE 1 TABLET BY MOUTH  DAILY 90 tablet 3   Calcium 500 MG CHEW Chew 500 mg by mouth 2 (two) times daily.     Cholecalciferol 250 MCG (10000 UT) CAPS Take by mouth.     Cyanocobalamin (VITAMIN B 12 PO) Take by mouth.     furosemide (LASIX) 20 MG tablet Take 1 tablet (20 mg total) by mouth daily as needed for edema. Take with potassium 30 tablet 3   levothyroxine (SYNTHROID) 100 MCG tablet Take 1 tablet (100 mcg total) by mouth daily. 90 tablet 3   metoprolol succinate (TOPROL-XL) 25 MG 24 hr tablet TAKE 1 TABLET BY MOUTH EVERY DAY 100 tablet 0   omeprazole (PRILOSEC) 20 MG capsule TAKE 1 CAPSULE BY MOUTH EVERY DAY 90 capsule 3   Polyethyl Glycol-Propyl Glycol (SYSTANE OP) Place 1 drop into both eyes as needed.     potassium chloride SA (KLOR-CON M) 10 MEQ tablet Take 1 tablet (10 mEq total) by mouth daily as needed (leg swelling). Take with furosemide 30 tablet 3   rosuvastatin (CRESTOR) 10 MG tablet TAKE 1 TABLET BY MOUTH DAILY 90 tablet 3   tiZANidine (ZANAFLEX) 4 MG tablet TAKE HALF TO ONE TABLET BY MOUTH EVERY 6 HOURS AS NEEDED FOR MUSCLE SPASMS 90 tablet 0   valsartan (DIOVAN) 40 MG tablet TAKE 1 TABLET BY MOUTH EVERY DAY 100 tablet 0   vitamin C (ASCORBIC ACID) 250 MG tablet Take 500 mg by mouth daily.     acetaminophen (TYLENOL) 500 MG tablet Take 1,000 mg by mouth every 6 (six) hours as needed for moderate pain or headache.     ibandronate (BONIVA) 150 MG tablet TAKE 1 TABLET BY MOUTH EACH MONTH WITH 8oz OF plain WATER 60 mins BEFORE first food, drink, OR meds. stay upright FOR 60 MINUTES. 4 tablet 0    Spacer/Aero-Holding Chambers (AEROCHAMBER MV) inhaler Use as instructed 1 each 0   Current Facility-Administered Medications  Medication Dose Route Frequency Provider Last Rate Last Admin   0.9 %  sodium chloride infusion  500 mL Intravenous Once Danis, Andreas Blower, MD          ___________________________________________________________________ Objective   Exam:  BP 128/86 (  BP Location: Right Arm, Patient Position: Sitting, Cuff Size: Normal)   Pulse 80   Temp 98.6 F (37 C) (Temporal)   Ht 5' 6.5" (1.689 m)   SpO2 95%   BMI 27.12 kg/m   CV: regular , S1/S2 Resp: clear to auscultation bilaterally, normal RR and effort noted GI: soft, no tenderness, with active bowel sounds.   Assessment: Encounter Diagnoses  Name Primary?   Personal history of colonic polyps Yes   Family history of colon cancer in mother      Plan: Colonoscopy   The benefits and risks of the planned procedure were described in detail with the patient or (when appropriate) their health care proxy.  Risks were outlined as including, but not limited to, bleeding, infection, perforation, adverse medication reaction leading to cardiac or pulmonary decompensation, pancreatitis (if ERCP).  The limitation of incomplete mucosal visualization was also discussed.  No guarantees or warranties were given.  The patient is appropriate for an endoscopic procedure in the ambulatory setting.   - Amada Jupiter, MD

## 2022-06-11 NOTE — Patient Instructions (Signed)
Impression/Recommendations:  Polyp and diverticulosis handouts given to patient.  Resume previous diet. Continue present medications. Await pathology results.  Repeat colonoscopy recommended for surveillance.  Date to be determined after pathology results reviewed.  YOU HAD AN ENDOSCOPIC PROCEDURE TODAY AT THE [facility]:   Refer to the procedure report that was given to you for any specific questions about what was found during the examination.  If the procedure report does not answer your questions, please call your gastroenterologist to clarify.  If you requested that your care partner not be given the details of your procedure findings, then the procedure report has been included in a sealed envelope for you to review at your convenience later.  YOU SHOULD EXPECT: Some feelings of bloating in the abdomen. Passage of more gas than usual.  Walking can help get rid of the air that was put into your GI tract during the procedure and reduce the bloating. If you had a lower endoscopy (such as a colonoscopy or flexible sigmoidoscopy) you may notice spotting of blood in your stool or on the toilet paper. If you underwent a bowel prep for your procedure, you may not have a normal bowel movement for a few days.  Please Note:  You might notice some irritation and congestion in your nose or some drainage.  This is from the oxygen used during your procedure.  There is no need for concern and it should clear up in a day or so.  SYMPTOMS TO REPORT IMMEDIATELY:  Following lower endoscopy (colonoscopy or flexible sigmoidoscopy):  Excessive amounts of blood in the stool  Significant tenderness or worsening of abdominal pains  Swelling of the abdomen that is new, acute  Fever of 100F or higher  For urgent or emergent issues, a gastroenterologist can be reached at any hour by calling [phone]. Do not use MyChart messaging for urgent concerns.    DIET:  We do recommend a small meal at  first, but then you may proceed to your regular diet.  Drink plenty of fluids but you should avoid alcoholic beverages for 24 hours.  ACTIVITY:  You should plan to take it easy for the rest of today and you should NOT DRIVE or use heavy machinery until tomorrow (because of the sedation medicines used during the test).    FOLLOW UP: Our staff will call the number listed on your records the next business day following your procedure.  We will call around 7:15- 8:00 am to check on you and address any questions or concerns that you may have regarding the information given to you following your procedure. If we do not reach you, we will leave a message.     If any biopsies were taken you will be contacted by phone or by letter within the next 1-3 weeks.  Please call us at [phone] if you have not heard about the biopsies in 3 weeks.    SIGNATURES/CONFIDENTIALITY: You and/or your care partner have signed paperwork which will be entered into your electronic medical record.  These signatures attest to the fact that that the information above on your After Visit Summary has been reviewed and is understood.  Full responsibility of the confidentiality of this discharge information lies with you and/or your care-partner.   

## 2022-06-11 NOTE — Progress Notes (Signed)
Vitals-CW  Pt's states no medical or surgical changes since previsit or office visit. 

## 2022-06-12 ENCOUNTER — Telehealth: Payer: Self-pay

## 2022-06-12 NOTE — Telephone Encounter (Signed)
Attempted to reach patient for post-procedure f/u call. No answer. Left message for her to please not hesitate to call if she has any questions/concerns regarding her care. 

## 2022-06-16 ENCOUNTER — Other Ambulatory Visit: Payer: Self-pay | Admitting: Family Medicine

## 2022-06-16 DIAGNOSIS — I428 Other cardiomyopathies: Secondary | ICD-10-CM

## 2022-06-17 ENCOUNTER — Encounter: Payer: Self-pay | Admitting: Gastroenterology

## 2022-06-18 ENCOUNTER — Ambulatory Visit: Payer: Medicare Other | Admitting: Physical Therapy

## 2022-06-23 ENCOUNTER — Ambulatory Visit
Admission: RE | Admit: 2022-06-23 | Discharge: 2022-06-23 | Disposition: A | Discharge status: Home / Self Care | Payer: Medicare Other | Source: Ambulatory Visit | Attending: Family Medicine | Admitting: Family Medicine

## 2022-06-23 DIAGNOSIS — Z1231 Encounter for screening mammogram for malignant neoplasm of breast: Secondary | ICD-10-CM

## 2022-06-25 ENCOUNTER — Ambulatory Visit: Payer: Medicare Other | Admitting: Physical Therapy

## 2022-07-01 NOTE — Therapy (Signed)
OUTPATIENT PHYSICAL THERAPY VESTIBULAR TREATMENT     Patient Name: April [name] MRN: [id number] DOB:1955-12-04, 67 y.o., female Today's Date: 07/02/2022  END OF SESSION:  PT End of Session - 07/02/22 1144     Visit Number 3    Number of Visits 6    Date for PT Re-Evaluation 07/16/22    Authorization Type [employer]    Progress Note Due on Visit 10    PT Start Time 1104    PT Stop Time 1143    PT Time Calculation (min) 39 min    Equipment Utilized During Treatment Gait belt    Activity Tolerance Patient tolerated treatment well    Behavior During Therapy WFL for tasks assessed/performed               Past Medical History:  Diagnosis Date   Allergy    Anemia    Anxiety    Anxiety and depression    related to caring for mother during terminal illness   Arthritis    Asthma    AVN (avascular necrosis of bone) (HCC)    hip and wrist   Bronchitis    hx of   Cataract    Chronic kidney disease    COPD (chronic obstructive pulmonary disease) (HCC)    Depression    Emphysema of lung (HCC)    Endometriosis    FH: CAD (coronary artery disease)    GERD (gastroesophageal reflux disease)    ocassional   History of blood transfusion    after hip repackment - broke out in hives and started itching   History of palpitations    Hyperlipidemia    Hypertension    Hypothyroid    Neuromuscular disorder (HCC)    Non-ischemic cardiomyopathy (HCC) 2019   Osteopenia    forteo through Dr. Corliss Skains (started 10/12)   Osteoporosis    PMR (polymyalgia rheumatica) (HCC)    SIRS (systemic inflammatory response syndrome) (HCC) 10/2017   Smoker    SOB (shortness of breath) on exertion    Squamous cell carcinoma    facial, 2016   Temporal arteritis (HCC)    s/p prednisone taper   Past Surgical History:  Procedure Laterality Date   ABDOMINAL ADHESION SURGERY     ABDOMINAL HYSTERECTOMY     BREAST EXCISIONAL BIOPSY Left    BREAST REDUCTION SURGERY Bilateral  06/13/2019   Procedure: MAMMARY REDUCTION  (BREAST);  Surgeon: Allena Napoleon, MD;  Location: [facility] OR;  Service: Plastics;  Laterality: Bilateral;   BREAST SURGERY Left    benign bx 1990   CARDIAC CATHETERIZATION  2007   no PCI   CATARACT EXTRACTION Bilateral    CHOLECYSTECTOMY     COLONOSCOPY     COLONOSCOPY W/ POLYPECTOMY     INCONTINENCE SURGERY     2007    JOINT REPLACEMENT Left 2012   left hip   REDUCTION MAMMAPLASTY     2021   REVERSE SHOULDER ARTHROPLASTY Left 05/28/2020   Procedure: REVERSE SHOULDER ARTHROPLASTY;  Surgeon: Yolonda Kida, MD;  Location: [facility] OR;  Service: Orthopedics;  Laterality: Left;  2.5 hrs   TONSILLECTOMY     TOTAL HIP ARTHROPLASTY Right 10/04/2012   Procedure: TOTAL HIP ARTHROPLASTY;  Surgeon: Valeria Batman, MD;  Location: [facility] OR;  Service: Orthopedics;  Laterality: Right;   TOTAL SHOULDER REPLACEMENT Left    WRIST SURGERY Left    2012/ left wrist/ bone removed due to necrosis   Patient Active Problem List  Diagnosis Date Noted   Bilateral lower extremity edema 05/27/2022   Frequent falls 03/05/2022   Serrated polyp of colon 03/05/2022   Mixed hyperlipidemia 12/03/2021   Unsteady gait 07/20/2021   Chronic diastolic CHF (congestive heart failure) (HCC) 06/13/2021   Upper airway cough syndrome 05/07/2021   Osteoporosis 03/23/2021   Allergic rhinitis 04/06/2019   Aortic atherosclerosis (HCC) 12/17/2017   Pulmonary nodule 12/17/2017   GERD (gastroesophageal reflux disease) 12/12/2017   COPD, severe (HCC) 11/24/2017   CKD (chronic kidney disease), stage III (HCC) 10/28/2017   Essential hypertension 06/23/2017   Polymyalgia rheumatica (HCC) 05/22/2016   History of bilateral hip replacements 05/22/2016   DDD (degenerative disc disease), lumbar 05/22/2016   History of avascular necrosis of capital femoral epiphysis 08/14/2014   Asthma 03/23/2010   Acquired hypothyroidism 03/21/2010   Chronic recurrent major depressive disorder (HCC)  03/21/2010   Former smoker 03/21/2010   History of temporal arteritis 03/21/2010    PCP: Willow Ora, MD REFERRING PROVIDER: Willow Ora, MD  REFERRING DIAG: R26.89 (ICD-10-CM) - Poor balance R29.6 (ICD-10-CM) - Frequent falls  THERAPY DIAG:  Unsteadiness on feet  Dizziness and giddiness  ONSET DATE: 5 years ago to present  Rationale for Evaluation and Treatment: Rehabilitation  SUBJECTIVE:   SUBJECTIVE STATEMENT:  Tripped over her cane and hit the L knee cap. It is much better now. Reports 2 episodes of migraines in her life but has not been diagnosed with them. Reports that a knot comes up on the R side of her neck sometimes. Reports hx of stenosis.   Pt accompanied by: self  PERTINENT HISTORY: history of pre-diabetes, B12 deficiency hypothyroidism, COPD (former smoker), HLD, HTN, osteoporosis, temporal arteritis, anxiety and depression  PAIN:  Are you having pain? No  PRECAUTIONS: Fall  WEIGHT BEARING RESTRICTIONS: No  FALLS: Has patient fallen in last 6 months? Yes. Number of falls 4  LIVING ENVIRONMENT: Lives with: lives alone Lives in: [place], [place] Stairs: Yes: External: flight steps; bilateral but cannot reach both Has following equipment at home: Single point cane and Walker - 4 wheeled  PLOF: Independent, Independent with basic ADLs, Independent with household mobility with device, Independent with community mobility with device, and pt reports she has a caregiver that comes 2-3x/wk to stand-by for showers and light housekeeping  PATIENT GOALS: improve balance, reduce risk for falls  OBJECTIVE:      TODAY'S TREATMENT: 07/02/22 Activity Comments  brandt daroff R/L 3x each  C/o mild dizziness upon laying down and sitting up. Slow movements   alt toe tap on cone 2x20; single and double tap Slow but good control   romberg + head turns/nods to targets 2x30" C/o mild-moderate dizziness horizontal; c/o moderate dizziness vertical; edu on how  to adjust speed of movement to get intended level of sx   romberg EC 2x30" Mild-mod sway    HOME EXERCISE PROGRAM Last updated: 07/02/22 Access Code: [email or web address] URL: [email or web address] Date: 07/02/2022 Prepared by: [facility] - Outpatient  Rehab - [facility]  Program Notes perform standing exercises in corner for safety  Exercises - Seated Gaze Stabilization with Head Rotation  - 1 x daily - 7 x weekly - 3-5 sets - 15-30 sec hold - Brandt-Daroff Vestibular Exercise  - 1 x daily - 5 x weekly - 2 sets - 3-5 reps - Romberg Stance with Head Rotation  - 1 x daily - 5 x weekly - 2-3 sets - 30 sec hold - Romberg Stance with Head Nods  -  1 x daily - 5 x weekly - 2-3 sets - 30 sec hold - Romberg Stance with Eyes Closed  - 1 x daily - 5 x weekly - 2 sets - 30 sec hold     PATIENT EDUCATION: Education details: edu on intended level of sx with HEP, HEP update- to be performed in corner for safety Person educated: Patient Education method: Explanation, Demonstration, Tactile cues, Verbal cues, and Handouts Education comprehension: verbalized understanding and returned demonstration    Below measures were taken at time of initial evaluation unless otherwise specified:   DIAGNOSTIC FINDINGS:  IMPRESSION: 1. No acute fracture or destructive bone lesion. 2. Multilevel degenerative disc and joint changes as above. 3. Moderate C5-6 and C6-7 central canal stenosis. 4. Multilevel neuroforaminal stenosis including moderate to severe left-greater-than-right C3-4, moderate to severe right and moderate left C4-5, severe right and moderate left C5-6, and severe right and mild-to-moderate left C6-7 neuroforaminal stenosis.  COGNITION: Overall cognitive status: Within functional limits for tasks assessed   SENSATION: WFL  EDEMA:  Yes, some swelling Left > right. Wearing compression stockings  MUSCLE TONE:  WNL  DTRs:  WNL  POSTURE:  forward head  Cervical ROM:     Active A/PROM (deg) eval  Flexion WNL  Extension 50  Right lateral flexion 25  Left lateral flexion 30  Right rotation 40  Left rotation 50  (Blank rows = not tested)  STRENGTH:   LOWER EXTREMITY MMT:   MMT Right eval Left eval  Hip flexion    Hip abduction    Hip adduction    Hip internal rotation    Hip external rotation    Knee flexion    Knee extension    Ankle dorsiflexion    Ankle plantarflexion    Ankle inversion    Ankle eversion    (Blank rows = not tested)  BED MOBILITY:  indep  TRANSFERS: Assistive device utilized: Single point cane and None  Sit to stand: Complete Independence Stand to sit: Complete Independence Chair to chair: Modified independence Floor:  NT    GAIT: Gait pattern: decreased stride length Distance walked:  Assistive device utilized: Single point cane Level of assistance: Modified independence Comments:     VESTIBULAR ASSESSMENT:  GENERAL OBSERVATION: wears progressive lenses   SYMPTOM BEHAVIOR:  Subjective history: 5 year hx of vertigo and general unsteadiness  Non-Vestibular symptoms: neck pain, headaches, tinnitus, and migraine symptoms  Type of dizziness: Imbalance (Disequilibrium), Unsteady with head/body turns, and "Swimmyheaded"  Frequency: varies  Duration: seconds, with lingering side effects x days  Aggravating factors: Induced by motion: turning body quickly and turning head quickly and Worse in the dark  Relieving factors: slow movements  Progression of symptoms: better  OCULOMOTOR EXAM:  Ocular Alignment: normal  Ocular ROM: No Limitations  Spontaneous Nystagmus: absent  Gaze-Induced Nystagmus: absent  Smooth Pursuits:  slow and off target  Saccades: hypometric/undershoots, extra eye movements, and slow  Convergence/Divergence:  very limited left eye convergence    VESTIBULAR - OCULAR REFLEX:   Slow VOR: Positive Bilaterally and Comment: saccadic intrusions horizontal > vertical  VOR  Cancellation: Corrective Saccades  Head-Impulse Test: HIT Right: positive HIT Left: positive, left side with greater amplitude  Dynamic Visual Acuity: Not able to be assessed   POSITIONAL TESTING: Pt denies any positional dizziness at this time but notes with sit to stand she experiences lightheadedness.  Reports she has had orthostatics checked at several MD appointments and therapy visits without drop in BP.  MOTION SENSITIVITY:  Motion Sensitivity Quotient Intensity: 0 = none, 1 = Lightheaded, 2 = Mild, 3 = Moderate, 4 = Severe, 5 = Vomiting  Intensity  1. Sitting to supine   2. Supine to L side   3. Supine to R side   4. Supine to sitting   5. L Hallpike-Dix   6. Up from L    7. R Hallpike-Dix   8. Up from R    9. Sitting, head tipped to L knee   10. Head up from L knee   11. Sitting, head tipped to R knee   12. Head up from R knee   13. Sitting head turns x5   14.Sitting head nods x5   15. In stance, 180 turn to L    16. In stance, 180 turn to R     OTHOSTATICS: not done  FUNCTIONAL GAIT: 5 times sit to stand: 18 sec with pushing back of legs on chair Timed up and go (TUG): NT Functional gait assessment: NT  06/09/22- TUG 12.6 seconds, FGA 17  M-CTSIB  Condition 1: Firm Surface, EO 30 Sec, Mild Sway  Condition 2: Firm Surface, EC 30 Sec, Moderate Sway  Condition 3: Foam Surface, EO 30 Sec, Mild Sway  Condition 4: Foam Surface, EC 20 Sec, Moderate and Severe Sway     VESTIBULAR TREATMENT:                                                                                                   DATE:   06/04/22  Canalith Repositioning:  Comment: not indicated at this time Gaze Adaptation:  x1 Viewing Horizontal: Position: sitting, Time: 15-30 sec, and Reps: 2 Habituation:  Other: TBD Other:   PATIENT EDUCATION: Education details: assessment fidings, rationale of intervention, HEP initiation Person educated: Patient Education method: Explanation and  Handouts Education comprehension: verbalized understanding and needs further education  HOME EXERCISE PROGRAM: Access Code: [email or web address] URL: [email or web address] Date: 06/04/2022 Prepared by: Shary Decamp  Exercises - Seated Gaze Stabilization with Head Rotation  - 1 x daily - 7 x weekly - 3-5 sets - 15-30 sec hold  GOALS: Goals reviewed with patient? Yes  SHORT TERM GOALS: Target date: 06/25/2022    The patient will be independent with HEP for gaze adaptation, habituation, balance, and general mobility. Baseline: Goal status: IN PROGRESS    LONG TERM GOALS: Target date: 07/16/2022    Demo improved balance and BLE strength per time 15 sec 5xSTS test Baseline: 18 sec w/ compensation Goal status: IN PROGRESS  2.  Manifest low risk for falls and improved mobility per score 22/28 Functional Gait Assessment Baseline: TBD Goal status: IN PROGRESS  3.  Demo improved postural stability and control maintaining normal-mild sway condition 4 M-CTSIB to improve safety with ADL Baseline: mod-severe x 20 sec Goal status: IN PROGRESS  4.  Demo lower risk for falls per time 15 sec TUG test to improve safety with mobility Baseline: TBD Goal status: MET 06/09/22    ASSESSMENT:  CLINICAL IMPRESSION:  Patient arrived to session with report of a fall, hurting the  L knee but reports this has since improved. Initiated habitation which brought on appropriate level of symptoms. Balance activities incorporated head turns, EC, and SLS. Patient did well with SLS activities. More dizziness and imbalance with head movement and EC challenges. Patient reported understanding of HEP update at end without complaints upon leaving.    OBJECTIVE IMPAIRMENTS: Abnormal gait, decreased activity tolerance, decreased balance, decreased mobility, decreased ROM, decreased strength, dizziness, and postural dysfunction.   ACTIVITY LIMITATIONS: lifting, bending, standing, transfers, reach over head, and  locomotion level  PARTICIPATION LIMITATIONS: meal prep, cleaning, driving, shopping, and community activity  PERSONAL FACTORS: Age, Time since onset of injury/illness/exacerbation, and 1-2 comorbidities: PMH  are also affecting patient's functional outcome.   REHAB POTENTIAL: Good  CLINICAL DECISION MAKING: Evolving/moderate complexity  EVALUATION COMPLEXITY: Moderate   PLAN:  PT FREQUENCY: 1x/week  PT DURATION: 6 weeks  PLANNED INTERVENTIONS: Therapeutic exercises, Therapeutic activity, Neuromuscular re-education, Balance training, Gait training, Patient/Family education, Self Care, Joint mobilization, Stair training, Vestibular training, Canalith repositioning, Orthotic/Fit training, DME instructions, Aquatic Therapy, Dry Needling, Electrical stimulation, Spinal mobilization, Taping, and Manual therapy  PLAN FOR NEXT SESSION: HEP review and additions, keep working on balance reaction skills, balance progressions     Anette Guarneri, PT, DPT 07/02/22 11:46 AM  [facility] at [facility] [address] [place], [place] [address] Phone # [phone] Fax # [phone]

## 2022-07-02 ENCOUNTER — Encounter: Payer: Self-pay | Admitting: Physical Therapy

## 2022-07-02 ENCOUNTER — Ambulatory Visit: Payer: Medicare Other | Admitting: Physical Therapy

## 2022-07-02 DIAGNOSIS — R2681 Unsteadiness on feet: Secondary | ICD-10-CM | POA: Diagnosis not present

## 2022-07-02 DIAGNOSIS — R42 Dizziness and giddiness: Secondary | ICD-10-CM | POA: Diagnosis not present

## 2022-07-07 ENCOUNTER — Ambulatory Visit: Payer: Medicare Other | Attending: Family Medicine

## 2022-07-07 DIAGNOSIS — R42 Dizziness and giddiness: Secondary | ICD-10-CM | POA: Diagnosis not present

## 2022-07-07 DIAGNOSIS — R2681 Unsteadiness on feet: Secondary | ICD-10-CM | POA: Diagnosis not present

## 2022-07-07 NOTE — Therapy (Signed)
OUTPATIENT PHYSICAL THERAPY VESTIBULAR TREATMENT     Patient Name: April [name] MRN: [id number] DOB:April 06, 1955, 67 y.o., female Today's Date: 07/07/2022  END OF SESSION:  PT End of Session - 07/07/22 1045     Visit Number 4    Number of Visits 6    Date for PT Re-Evaluation 07/16/22    Authorization Type [employer]    Progress Note Due on Visit 10    PT Start Time 1100    PT Stop Time 1145    PT Time Calculation (min) 45 min    Equipment Utilized During Treatment Gait belt    Activity Tolerance Patient tolerated treatment well    Behavior During Therapy WFL for tasks assessed/performed               Past Medical History:  Diagnosis Date   Allergy    Anemia    Anxiety    Anxiety and depression    related to caring for mother during terminal illness   Arthritis    Asthma    AVN (avascular necrosis of bone) (HCC)    hip and wrist   Bronchitis    hx of   Cataract    Chronic kidney disease    COPD (chronic obstructive pulmonary disease) (HCC)    Depression    Emphysema of lung (HCC)    Endometriosis    FH: CAD (coronary artery disease)    GERD (gastroesophageal reflux disease)    ocassional   History of blood transfusion    after hip repackment - broke out in hives and started itching   History of palpitations    Hyperlipidemia    Hypertension    Hypothyroid    Neuromuscular disorder (HCC)    Non-ischemic cardiomyopathy (HCC) 2019   Osteopenia    forteo through Dr. Corliss Skains (started 10/12)   Osteoporosis    PMR (polymyalgia rheumatica) (HCC)    SIRS (systemic inflammatory response syndrome) (HCC) 10/2017   Smoker    SOB (shortness of breath) on exertion    Squamous cell carcinoma    facial, 2016   Temporal arteritis (HCC)    s/p prednisone taper   Past Surgical History:  Procedure Laterality Date   ABDOMINAL ADHESION SURGERY     ABDOMINAL HYSTERECTOMY     BREAST EXCISIONAL BIOPSY Left    BREAST REDUCTION SURGERY Bilateral  06/13/2019   Procedure: MAMMARY REDUCTION  (BREAST);  Surgeon: Allena Napoleon, MD;  Location: [facility] OR;  Service: Plastics;  Laterality: Bilateral;   BREAST SURGERY Left    benign bx 1990   CARDIAC CATHETERIZATION  2007   no PCI   CATARACT EXTRACTION Bilateral    CHOLECYSTECTOMY     COLONOSCOPY     COLONOSCOPY W/ POLYPECTOMY     INCONTINENCE SURGERY     2007    JOINT REPLACEMENT Left 2012   left hip   REDUCTION MAMMAPLASTY     2021   REVERSE SHOULDER ARTHROPLASTY Left 05/28/2020   Procedure: REVERSE SHOULDER ARTHROPLASTY;  Surgeon: Yolonda Kida, MD;  Location: [facility] OR;  Service: Orthopedics;  Laterality: Left;  2.5 hrs   TONSILLECTOMY     TOTAL HIP ARTHROPLASTY Right 10/04/2012   Procedure: TOTAL HIP ARTHROPLASTY;  Surgeon: Valeria Batman, MD;  Location: [facility] OR;  Service: Orthopedics;  Laterality: Right;   TOTAL SHOULDER REPLACEMENT Left    WRIST SURGERY Left    2012/ left wrist/ bone removed due to necrosis   Patient Active Problem List  Diagnosis Date Noted   Bilateral lower extremity edema 05/27/2022   Frequent falls 03/05/2022   Serrated polyp of colon 03/05/2022   Mixed hyperlipidemia 12/03/2021   Unsteady gait 07/20/2021   Chronic diastolic CHF (congestive heart failure) (HCC) 06/13/2021   Upper airway cough syndrome 05/07/2021   Osteoporosis 03/23/2021   Allergic rhinitis 04/06/2019   Aortic atherosclerosis (HCC) 12/17/2017   Pulmonary nodule 12/17/2017   GERD (gastroesophageal reflux disease) 12/12/2017   COPD, severe (HCC) 11/24/2017   CKD (chronic kidney disease), stage III (HCC) 10/28/2017   Essential hypertension 06/23/2017   Polymyalgia rheumatica (HCC) 05/22/2016   History of bilateral hip replacements 05/22/2016   DDD (degenerative disc disease), lumbar 05/22/2016   History of avascular necrosis of capital femoral epiphysis 08/14/2014   Asthma 03/23/2010   Acquired hypothyroidism 03/21/2010   Chronic recurrent major depressive disorder (HCC)  03/21/2010   Former smoker 03/21/2010   History of temporal arteritis 03/21/2010    PCP: Willow Ora, MD REFERRING PROVIDER: Willow Ora, MD  REFERRING DIAG: R26.89 (ICD-10-CM) - Poor balance R29.6 (ICD-10-CM) - Frequent falls  THERAPY DIAG:  Unsteadiness on feet  Dizziness and giddiness  ONSET DATE: 5 years ago to present  Rationale for Evaluation and Treatment: Rehabilitation  SUBJECTIVE:   SUBJECTIVE STATEMENT: Knee is overall feeling better.  No additional trips/falls.  Still having difficulty with the balance. No return of the severe dizziness. Look up (tilting head back) causes unsteadiness Pt accompanied by: self  PERTINENT HISTORY: history of pre-diabetes, B12 deficiency hypothyroidism, COPD (former smoker), HLD, HTN, osteoporosis, temporal arteritis, anxiety and depression  PAIN:  Are you having pain? No  PRECAUTIONS: Fall  WEIGHT BEARING RESTRICTIONS: No  FALLS: Has patient fallen in last 6 months? Yes. Number of falls 4  LIVING ENVIRONMENT: Lives with: lives alone Lives in: [place], [place] Stairs: Yes: External: flight steps; bilateral but cannot reach both Has following equipment at home: Single point cane and Walker - 4 wheeled  PLOF: Independent, Independent with basic ADLs, Independent with household mobility with device, Independent with community mobility with device, and pt reports she has a caregiver that comes 2-3x/wk to stand-by for showers and light housekeeping  PATIENT GOALS: improve balance, reduce risk for falls  OBJECTIVE:    TODAY'S TREATMENT: 07/07/22 Activity Comments  Brandt-Daroff 3x Cues for pacing, notes some degree of latency  VOR x 1 (seated/standing) x 30 sec -horizontal: no issue, 90 bpm -vertical; symptomatic 2-3/10  VOR cancellation X 30 sec, much more smooth control as compared to eval  balance -feet together EO/EC x 30 sec. Head turns EO/EC  habituation -step up and reach overhead 10x  Balance -EO/EC x 30  sec -feet apart EO/EC x 30 sec On foam -semi-tandem 1x30 sec -rockerboard x 60 sec      TODAY'S TREATMENT: 07/02/22 Activity Comments  brandt daroff R/L 3x each  C/o mild dizziness upon laying down and sitting up. Slow movements   alt toe tap on cone 2x20; single and double tap Slow but good control   romberg + head turns/nods to targets 2x30" C/o mild-moderate dizziness horizontal; c/o moderate dizziness vertical; edu on how to adjust speed of movement to get intended level of sx   romberg EC 2x30" Mild-mod sway    HOME EXERCISE PROGRAM Last updated: 07/02/22 Access Code: [email or web address] URL: [email or web address] Date: 07/02/2022 Prepared by: [facility]  Program Notes perform standing exercises in corner for safety  Exercises - Seated Gaze Stabilization with  Head Rotation  - 1 x daily - 7 x weekly - 3-5 sets - 15-30 sec hold - Brandt-Daroff Vestibular Exercise  - 1 x daily - 5 x weekly - 2 sets - 3-5 reps - Romberg Stance with Head Rotation  - 1 x daily - 5 x weekly - 2-3 sets - 30 sec hold - Romberg Stance with Head Nods  - 1 x daily - 5 x weekly - 2-3 sets - 30 sec hold - Romberg Stance with Eyes Closed  - 1 x daily - 5 x weekly - 2 sets - 30 sec hold - Semi-Tandem Corner Balance With Eyes Open  - 1 x daily - 7 x weekly - 3 sets - 15-30 sec hold    PATIENT EDUCATION: Education details: edu on intended level of sx with HEP, HEP update- to be performed in corner for safety Person educated: Patient Education method: Explanation, Demonstration, Tactile cues, Verbal cues, and Handouts Education comprehension: verbalized understanding and returned demonstration    Below measures were taken at time of initial evaluation unless otherwise specified:   DIAGNOSTIC FINDINGS:  IMPRESSION: 1. No acute fracture or destructive bone lesion. 2. Multilevel degenerative disc and joint changes as above. 3. Moderate C5-6 and C6-7 central  canal stenosis. 4. Multilevel neuroforaminal stenosis including moderate to severe left-greater-than-right C3-4, moderate to severe right and moderate left C4-5, severe right and moderate left C5-6, and severe right and mild-to-moderate left C6-7 neuroforaminal stenosis.  COGNITION: Overall cognitive status: Within functional limits for tasks assessed   SENSATION: WFL  EDEMA:  Yes, some swelling Left > right. Wearing compression stockings  MUSCLE TONE:  WNL  DTRs:  WNL  POSTURE:  forward head  Cervical ROM:    Active A/PROM (deg) eval  Flexion WNL  Extension 50  Right lateral flexion 25  Left lateral flexion 30  Right rotation 40  Left rotation 50  (Blank rows = not tested)  STRENGTH:   LOWER EXTREMITY MMT:   MMT Right eval Left eval  Hip flexion    Hip abduction    Hip adduction    Hip internal rotation    Hip external rotation    Knee flexion    Knee extension    Ankle dorsiflexion    Ankle plantarflexion    Ankle inversion    Ankle eversion    (Blank rows = not tested)  BED MOBILITY:  indep  TRANSFERS: Assistive device utilized: Single point cane and None  Sit to stand: Complete Independence Stand to sit: Complete Independence Chair to chair: Modified independence Floor:  NT    GAIT: Gait pattern: decreased stride length Distance walked:  Assistive device utilized: Single point cane Level of assistance: Modified independence Comments:     VESTIBULAR ASSESSMENT:  GENERAL OBSERVATION: wears progressive lenses   SYMPTOM BEHAVIOR:  Subjective history: 5 year hx of vertigo and general unsteadiness  Non-Vestibular symptoms: neck pain, headaches, tinnitus, and migraine symptoms  Type of dizziness: Imbalance (Disequilibrium), Unsteady with head/body turns, and "Swimmyheaded"  Frequency: varies  Duration: seconds, with lingering side effects x days  Aggravating factors: Induced by motion: turning body quickly and turning head quickly  and Worse in the dark  Relieving factors: slow movements  Progression of symptoms: better  OCULOMOTOR EXAM:  Ocular Alignment: normal  Ocular ROM: No Limitations  Spontaneous Nystagmus: absent  Gaze-Induced Nystagmus: absent  Smooth Pursuits:  slow and off target  Saccades: hypometric/undershoots, extra eye movements, and slow  Convergence/Divergence:  very limited left eye convergence  VESTIBULAR - OCULAR REFLEX:   Slow VOR: Positive Bilaterally and Comment: saccadic intrusions horizontal > vertical  VOR Cancellation: Corrective Saccades  Head-Impulse Test: HIT Right: positive HIT Left: positive, left side with greater amplitude  Dynamic Visual Acuity: Not able to be assessed   POSITIONAL TESTING: Pt denies any positional dizziness at this time but notes with sit to stand she experiences lightheadedness.  Reports she has had orthostatics checked at several MD appointments and therapy visits without drop in BP.   MOTION SENSITIVITY:  Motion Sensitivity Quotient Intensity: 0 = none, 1 = Lightheaded, 2 = Mild, 3 = Moderate, 4 = Severe, 5 = Vomiting  Intensity  1. Sitting to supine   2. Supine to L side   3. Supine to R side   4. Supine to sitting   5. L Hallpike-Dix   6. Up from L    7. R Hallpike-Dix   8. Up from R    9. Sitting, head tipped to L knee   10. Head up from L knee   11. Sitting, head tipped to R knee   12. Head up from R knee   13. Sitting head turns x5   14.Sitting head nods x5   15. In stance, 180 turn to L    16. In stance, 180 turn to R     OTHOSTATICS: not done  FUNCTIONAL GAIT: 5 times sit to stand: 18 sec with pushing back of legs on chair Timed up and go (TUG): NT Functional gait assessment: NT  06/09/22- TUG 12.6 seconds, FGA 17  M-CTSIB  Condition 1: Firm Surface, EO 30 Sec, Mild Sway  Condition 2: Firm Surface, EC 30 Sec, Moderate Sway  Condition 3: Foam Surface, EO 30 Sec, Mild Sway  Condition 4: Foam Surface, EC 20 Sec, Moderate and  Severe Sway     VESTIBULAR TREATMENT:                                                                                                   DATE:   06/04/22  Canalith Repositioning:  Comment: not indicated at this time Gaze Adaptation:  x1 Viewing Horizontal: Position: sitting, Time: 15-30 sec, and Reps: 2 Habituation:  Other: TBD Other:   PATIENT EDUCATION: Education details: assessment fidings, rationale of intervention, HEP initiation Person educated: Patient Education method: Explanation and Handouts Education comprehension: verbalized understanding and needs further education  HOME EXERCISE PROGRAM: Access Code: [email or web address] URL: [email or web address] Date: 06/04/2022 Prepared by: Shary Decamp  Exercises - Seated Gaze Stabilization with Head Rotation  - 1 x daily - 7 x weekly - 3-5 sets - 15-30 sec hold  GOALS: Goals reviewed with patient? Yes  SHORT TERM GOALS: Target date: 06/25/2022    The patient will be independent with HEP for gaze adaptation, habituation, balance, and general mobility. Baseline: Goal status: IN PROGRESS    LONG TERM GOALS: Target date: 07/16/2022    Demo improved balance and BLE strength per time 15 sec 5xSTS test Baseline: 18 sec w/ compensation Goal status: IN PROGRESS  2.  Manifest low risk for falls and improved  mobility per score 22/28 Functional Gait Assessment Baseline: TBD Goal status: IN PROGRESS  3.  Demo improved postural stability and control maintaining normal-mild sway condition 4 M-CTSIB to improve safety with ADL Baseline: mod-severe x 20 sec Goal status: IN PROGRESS  4.  Demo lower risk for falls per time 15 sec TUG test to improve safety with mobility Baseline: TBD Goal status: MET 06/09/22    ASSESSMENT:  CLINICAL IMPRESSION: Pt notes overall improvement in symptoms and reports that habituation activities do not cause as significant symptoms and notes some unsteadiness when standing and looking up into  closet shelf but relieved by holding on to something.  Repeated neck extension in standing with reaching provoked 2-3/10 symptoms.  Continued with activitie sto improve balance and postural stablity through multi-sensory balance challenge.  Decrease in provocation with VOR and VOR cancellation and tolerating in standing without adverse effect. Continued sessions to progress POC details   OBJECTIVE IMPAIRMENTS: Abnormal gait, decreased activity tolerance, decreased balance, decreased mobility, decreased ROM, decreased strength, dizziness, and postural dysfunction.   ACTIVITY LIMITATIONS: lifting, bending, standing, transfers, reach over head, and locomotion level  PARTICIPATION LIMITATIONS: meal prep, cleaning, driving, shopping, and community activity  PERSONAL FACTORS: Age, Time since onset of injury/illness/exacerbation, and 1-2 comorbidities: PMH  are also affecting patient's functional outcome.   REHAB POTENTIAL: Good  CLINICAL DECISION MAKING: Evolving/moderate complexity  EVALUATION COMPLEXITY: Moderate   PLAN:  PT FREQUENCY: 1x/week  PT DURATION: 6 weeks  PLANNED INTERVENTIONS: Therapeutic exercises, Therapeutic activity, Neuromuscular re-education, Balance training, Gait training, Patient/Family education, Self Care, Joint mobilization, Stair training, Vestibular training, Canalith repositioning, Orthotic/Fit training, DME instructions, Aquatic Therapy, Dry Needling, Electrical stimulation, Spinal mobilization, Taping, and Manual therapy  PLAN FOR NEXT SESSION: HEP review and additions, keep working on balance reaction skills, balance progressions    11:44 AM, 07/07/22 M. Shary Decamp, PT, DPT Physical Therapist- [place] Office Number: [phone]

## 2022-07-10 ENCOUNTER — Telehealth: Payer: Self-pay | Admitting: Pulmonary Disease

## 2022-07-10 ENCOUNTER — Other Ambulatory Visit: Payer: Self-pay | Admitting: Pulmonary Disease

## 2022-07-10 MED ORDER — NIRMATRELVIR/RITONAVIR (PAXLOVID)TABLET: 3.0000 | ORAL_TABLET | Freq: Two times a day (BID) | ORAL | 0 refills | Status: AC

## 2022-07-10 NOTE — Telephone Encounter (Signed)
Dr. Val Eagle patients pharmacy has called stating they are out of paxlovid and want to know if you would switch to molnupiravir?

## 2022-07-10 NOTE — Telephone Encounter (Signed)
Called pt and there was no answer-LMTCB °

## 2022-07-10 NOTE — Telephone Encounter (Addendum)
Spoke to patient.  She stated that she tested positive for covid 07/10/2022. Sx started 07/08/2022. C/o headache, nasal congestion, dry cough, mild wheezing, sore throat, increased SOB and chills. She has not measured her temp but feels that she may have had a fever last night. She does not wear supplemental oxygen. Spo2 92% this morning. Using albuterol BID and Breztri BID.   OA please advise Dr. Tonia Brooms is unavailable.

## 2022-07-10 NOTE — Telephone Encounter (Signed)
I did call patient regarding testing positive for COVID with some symptoms  Went to [facility] call patient back to check on her  Over-the-counter medications for symptom management  I have a pended order for Paxlovid if she wants to use Paxlovid

## 2022-07-10 NOTE — Telephone Encounter (Signed)
Tested positive for covid 07/10/22. Symptoms started late 7/3,early 7/4. She is asking for meds to help with this

## 2022-07-13 NOTE — Therapy (Signed)
OUTPATIENT PHYSICAL THERAPY VESTIBULAR TREATMENT     Patient Name: April [name] MRN: [id number] DOB:12/11/1955, 67 y.o., female Today's Date: 07/13/2022  END OF SESSION:      Past Medical History:  Diagnosis Date   Allergy    Anemia    Anxiety    Anxiety and depression    related to caring for mother during terminal illness   Arthritis    Asthma    AVN (avascular necrosis of bone) (HCC)    hip and wrist   Bronchitis    hx of   Cataract    Chronic kidney disease    COPD (chronic obstructive pulmonary disease) (HCC)    Depression    Emphysema of lung (HCC)    Endometriosis    FH: CAD (coronary artery disease)    GERD (gastroesophageal reflux disease)    ocassional   History of blood transfusion    after hip repackment - broke out in hives and started itching   History of palpitations    Hyperlipidemia    Hypertension    Hypothyroid    Neuromuscular disorder (HCC)    Non-ischemic cardiomyopathy (HCC) 2019   Osteopenia    forteo through Dr. Corliss Skains (started 10/12)   Osteoporosis    PMR (polymyalgia rheumatica) (HCC)    SIRS (systemic inflammatory response syndrome) (HCC) 10/2017   Smoker    SOB (shortness of breath) on exertion    Squamous cell carcinoma    facial, 2016   Temporal arteritis (HCC)    s/p prednisone taper   Past Surgical History:  Procedure Laterality Date   ABDOMINAL ADHESION SURGERY     ABDOMINAL HYSTERECTOMY     BREAST EXCISIONAL BIOPSY Left    BREAST REDUCTION SURGERY Bilateral 06/13/2019   Procedure: MAMMARY REDUCTION  (BREAST);  Surgeon: Allena Napoleon, MD;  Location: [facility] OR;  Service: Plastics;  Laterality: Bilateral;   BREAST SURGERY Left    benign bx 1990   CARDIAC CATHETERIZATION  2007   no PCI   CATARACT EXTRACTION Bilateral    CHOLECYSTECTOMY     COLONOSCOPY     COLONOSCOPY W/ POLYPECTOMY     INCONTINENCE SURGERY     2007    JOINT REPLACEMENT Left 2012   left hip   REDUCTION MAMMAPLASTY     2021   REVERSE  SHOULDER ARTHROPLASTY Left 05/28/2020   Procedure: REVERSE SHOULDER ARTHROPLASTY;  Surgeon: Yolonda Kida, MD;  Location: [facility] OR;  Service: Orthopedics;  Laterality: Left;  2.5 hrs   TONSILLECTOMY     TOTAL HIP ARTHROPLASTY Right 10/04/2012   Procedure: TOTAL HIP ARTHROPLASTY;  Surgeon: Valeria Batman, MD;  Location: [facility] OR;  Service: Orthopedics;  Laterality: Right;   TOTAL SHOULDER REPLACEMENT Left    WRIST SURGERY Left    2012/ left wrist/ bone removed due to necrosis   Patient Active Problem List   Diagnosis Date Noted   Bilateral lower extremity edema 05/27/2022   Frequent falls 03/05/2022   Serrated polyp of colon 03/05/2022   Mixed hyperlipidemia 12/03/2021   Unsteady gait 07/20/2021   Chronic diastolic CHF (congestive heart failure) (HCC) 06/13/2021   Upper airway cough syndrome 05/07/2021   Osteoporosis 03/23/2021   Allergic rhinitis 04/06/2019   Aortic atherosclerosis (HCC) 12/17/2017   Pulmonary nodule 12/17/2017   GERD (gastroesophageal reflux disease) 12/12/2017   COPD, severe (HCC) 11/24/2017   CKD (chronic kidney disease), stage III (HCC) 10/28/2017   Essential hypertension 06/23/2017   Polymyalgia rheumatica (HCC) 05/22/2016   History  of bilateral hip replacements 05/22/2016   DDD (degenerative disc disease), lumbar 05/22/2016   History of avascular necrosis of capital femoral epiphysis 08/14/2014   Asthma 03/23/2010   Acquired hypothyroidism 03/21/2010   Chronic recurrent major depressive disorder (HCC) 03/21/2010   Former smoker 03/21/2010   History of temporal arteritis 03/21/2010    PCP: Willow Ora, MD REFERRING PROVIDER: Willow Ora, MD  REFERRING DIAG: R26.89 (ICD-10-CM) - Poor balance R29.6 (ICD-10-CM) - Frequent falls  THERAPY DIAG:  No diagnosis found.  ONSET DATE: 5 years ago to present  Rationale for Evaluation and Treatment: Rehabilitation  SUBJECTIVE:   SUBJECTIVE STATEMENT: Knee is overall feeling better.  No  additional trips/falls.  Still having difficulty with the balance. No return of the severe dizziness. Look up (tilting head back) causes unsteadiness Pt accompanied by: self  PERTINENT HISTORY: history of pre-diabetes, B12 deficiency hypothyroidism, COPD (former smoker), HLD, HTN, osteoporosis, temporal arteritis, anxiety and depression  PAIN:  Are you having pain? No  PRECAUTIONS: Fall  WEIGHT BEARING RESTRICTIONS: No  FALLS: Has patient fallen in last 6 months? Yes. Number of falls 4  LIVING ENVIRONMENT: Lives with: lives alone Lives in: [place], [place] Stairs: Yes: External: flight steps; bilateral but cannot reach both Has following equipment at home: Single point cane and Walker - 4 wheeled  PLOF: Independent, Independent with basic ADLs, Independent with household mobility with device, Independent with community mobility with device, and pt reports she has a caregiver that comes 2-3x/wk to stand-by for showers and light housekeeping  PATIENT GOALS: improve balance, reduce risk for falls  OBJECTIVE:     TODAY'S TREATMENT: 07/14/22 Activity Comments                       HOME EXERCISE PROGRAM Last updated: 07/02/22 Access Code: [email or web address] URL: [email or web address] Date: 07/02/2022 Prepared by: [facility] - Outpatient  Rehab - [facility]  Program Notes perform standing exercises in corner for safety  Exercises - Seated Gaze Stabilization with Head Rotation  - 1 x daily - 7 x weekly - 3-5 sets - 15-30 sec hold - Brandt-Daroff Vestibular Exercise  - 1 x daily - 5 x weekly - 2 sets - 3-5 reps - Romberg Stance with Head Rotation  - 1 x daily - 5 x weekly - 2-3 sets - 30 sec hold - Romberg Stance with Head Nods  - 1 x daily - 5 x weekly - 2-3 sets - 30 sec hold - Romberg Stance with Eyes Closed  - 1 x daily - 5 x weekly - 2 sets - 30 sec hold - Semi-Tandem Corner Balance With Eyes Open  - 1 x daily - 7 x weekly - 3 sets - 15-30 sec  hold    Below measures were taken at time of initial evaluation unless otherwise specified:   DIAGNOSTIC FINDINGS:  IMPRESSION: 1. No acute fracture or destructive bone lesion. 2. Multilevel degenerative disc and joint changes as above. 3. Moderate C5-6 and C6-7 central canal stenosis. 4. Multilevel neuroforaminal stenosis including moderate to severe left-greater-than-right C3-4, moderate to severe right and moderate left C4-5, severe right and moderate left C5-6, and severe right and mild-to-moderate left C6-7 neuroforaminal stenosis.  COGNITION: Overall cognitive status: Within functional limits for tasks assessed   SENSATION: WFL  EDEMA:  Yes, some swelling Left > right. Wearing compression stockings  MUSCLE TONE:  WNL  DTRs:  WNL  POSTURE:  forward head  Cervical ROM:    Active  A/PROM (deg) eval  Flexion WNL  Extension 50  Right lateral flexion 25  Left lateral flexion 30  Right rotation 40  Left rotation 50  (Blank rows = not tested)  STRENGTH:   LOWER EXTREMITY MMT:   MMT Right eval Left eval  Hip flexion    Hip abduction    Hip adduction    Hip internal rotation    Hip external rotation    Knee flexion    Knee extension    Ankle dorsiflexion    Ankle plantarflexion    Ankle inversion    Ankle eversion    (Blank rows = not tested)  BED MOBILITY:  indep  TRANSFERS: Assistive device utilized: Single point cane and None  Sit to stand: Complete Independence Stand to sit: Complete Independence Chair to chair: Modified independence Floor:  NT    GAIT: Gait pattern: decreased stride length Distance walked:  Assistive device utilized: Single point cane Level of assistance: Modified independence Comments:     VESTIBULAR ASSESSMENT:  GENERAL OBSERVATION: wears progressive lenses   SYMPTOM BEHAVIOR:  Subjective history: 5 year hx of vertigo and general unsteadiness  Non-Vestibular symptoms: neck pain, headaches, tinnitus, and  migraine symptoms  Type of dizziness: Imbalance (Disequilibrium), Unsteady with head/body turns, and "Swimmyheaded"  Frequency: varies  Duration: seconds, with lingering side effects x days  Aggravating factors: Induced by motion: turning body quickly and turning head quickly and Worse in the dark  Relieving factors: slow movements  Progression of symptoms: better  OCULOMOTOR EXAM:  Ocular Alignment: normal  Ocular ROM: No Limitations  Spontaneous Nystagmus: absent  Gaze-Induced Nystagmus: absent  Smooth Pursuits:  slow and off target  Saccades: hypometric/undershoots, extra eye movements, and slow  Convergence/Divergence:  very limited left eye convergence    VESTIBULAR - OCULAR REFLEX:   Slow VOR: Positive Bilaterally and Comment: saccadic intrusions horizontal > vertical  VOR Cancellation: Corrective Saccades  Head-Impulse Test: HIT Right: positive HIT Left: positive, left side with greater amplitude  Dynamic Visual Acuity: Not able to be assessed   POSITIONAL TESTING: Pt denies any positional dizziness at this time but notes with sit to stand she experiences lightheadedness.  Reports she has had orthostatics checked at several MD appointments and therapy visits without drop in BP.   MOTION SENSITIVITY:  Motion Sensitivity Quotient Intensity: 0 = none, 1 = Lightheaded, 2 = Mild, 3 = Moderate, 4 = Severe, 5 = Vomiting  Intensity  1. Sitting to supine   2. Supine to L side   3. Supine to R side   4. Supine to sitting   5. L Hallpike-Dix   6. Up from L    7. R Hallpike-Dix   8. Up from R    9. Sitting, head tipped to L knee   10. Head up from L knee   11. Sitting, head tipped to R knee   12. Head up from R knee   13. Sitting head turns x5   14.Sitting head nods x5   15. In stance, 180 turn to L    16. In stance, 180 turn to R     OTHOSTATICS: not done  FUNCTIONAL GAIT: 5 times sit to stand: 18 sec with pushing back of legs on chair Timed up and go (TUG):  NT Functional gait assessment: NT  06/09/22- TUG 12.6 seconds, FGA 17  M-CTSIB  Condition 1: Firm Surface, EO 30 Sec, Mild Sway  Condition 2: Firm Surface, EC 30 Sec, Moderate Sway  Condition 3:  Foam Surface, EO 30 Sec, Mild Sway  Condition 4: Foam Surface, EC 20 Sec, Moderate and Severe Sway     VESTIBULAR TREATMENT:                                                                                                   DATE:   06/04/22  Canalith Repositioning:  Comment: not indicated at this time Gaze Adaptation:  x1 Viewing Horizontal: Position: sitting, Time: 15-30 sec, and Reps: 2 Habituation:  Other: TBD Other:   PATIENT EDUCATION: Education details: assessment fidings, rationale of intervention, HEP initiation Person educated: Patient Education method: Explanation and Handouts Education comprehension: verbalized understanding and needs further education  HOME EXERCISE PROGRAM: Access Code: [email or web address] URL: [email or web address] Date: 06/04/2022 Prepared by: Shary Decamp  Exercises - Seated Gaze Stabilization with Head Rotation  - 1 x daily - 7 x weekly - 3-5 sets - 15-30 sec hold  GOALS: Goals reviewed with patient? Yes  SHORT TERM GOALS: Target date: 06/25/2022    The patient will be independent with HEP for gaze adaptation, habituation, balance, and general mobility. Baseline: Goal status: IN PROGRESS    LONG TERM GOALS: Target date: 07/16/2022    Demo improved balance and BLE strength per time 15 sec 5xSTS test Baseline: 18 sec w/ compensation Goal status: IN PROGRESS  2.  Manifest low risk for falls and improved mobility per score 22/28 Functional Gait Assessment Baseline: TBD Goal status: IN PROGRESS  3.  Demo improved postural stability and control maintaining normal-mild sway condition 4 M-CTSIB to improve safety with ADL Baseline: mod-severe x 20 sec Goal status: IN PROGRESS  4.  Demo lower risk for falls per time 15 sec TUG test to  improve safety with mobility Baseline: TBD Goal status: MET 06/09/22    ASSESSMENT:  CLINICAL IMPRESSION: Pt notes overall improvement in symptoms and reports that habituation activities do not cause as significant symptoms and notes some unsteadiness when standing and looking up into closet shelf but relieved by holding on to something.  Repeated neck extension in standing with reaching provoked 2-3/10 symptoms.  Continued with activitie sto improve balance and postural stablity through multi-sensory balance challenge.  Decrease in provocation with VOR and VOR cancellation and tolerating in standing without adverse effect. Continued sessions to progress POC details   OBJECTIVE IMPAIRMENTS: Abnormal gait, decreased activity tolerance, decreased balance, decreased mobility, decreased ROM, decreased strength, dizziness, and postural dysfunction.   ACTIVITY LIMITATIONS: lifting, bending, standing, transfers, reach over head, and locomotion level  PARTICIPATION LIMITATIONS: meal prep, cleaning, driving, shopping, and community activity  PERSONAL FACTORS: Age, Time since onset of injury/illness/exacerbation, and 1-2 comorbidities: PMH  are also affecting patient's functional outcome.   REHAB POTENTIAL: Good  CLINICAL DECISION MAKING: Evolving/moderate complexity  EVALUATION COMPLEXITY: Moderate   PLAN:  PT FREQUENCY: 1x/week  PT DURATION: 6 weeks  PLANNED INTERVENTIONS: Therapeutic exercises, Therapeutic activity, Neuromuscular re-education, Balance training, Gait training, Patient/Family education, Self Care, Joint mobilization, Stair training, Vestibular training, Canalith repositioning, Orthotic/Fit training, DME instructions, Aquatic Therapy, Dry Needling, Electrical stimulation, Spinal mobilization, Taping, and Manual therapy  PLAN FOR  NEXT SESSION: HEP review and additions, keep working on balance reaction skills, balance progressions

## 2022-07-13 NOTE — Telephone Encounter (Signed)
Please call to check on patient to see how she is doing

## 2022-07-13 NOTE — Telephone Encounter (Signed)
Called and spoke with patient. She stated that she was feeling much better. Her O2 levels have remained in the 90s. She last had a fever on Friday. She still has a runny nose but the sore throat is gone. She appreciated the call checking on her.   Nothing further needed at time of call.

## 2022-07-14 ENCOUNTER — Encounter: Payer: Self-pay | Admitting: Physical Therapy

## 2022-07-14 ENCOUNTER — Ambulatory Visit: Payer: Medicare Other | Admitting: Physical Therapy

## 2022-07-14 DIAGNOSIS — R42 Dizziness and giddiness: Secondary | ICD-10-CM | POA: Diagnosis not present

## 2022-07-14 DIAGNOSIS — R2681 Unsteadiness on feet: Secondary | ICD-10-CM

## 2022-07-20 NOTE — Therapy (Signed)
OUTPATIENT PHYSICAL THERAPY VESTIBULAR TREATMENT     Patient Name: April [name] MRN: [id number] DOB:06/14/1955, 67 y.o., female Today's Date: 07/21/2022  END OF SESSION:  PT End of Session - 07/21/22 1143     Visit Number 6    Number of Visits 9    Date for PT Re-Evaluation 08/11/22    Authorization Type [employer]    Progress Note Due on Visit 10    PT Start Time 1103    PT Stop Time 1143    PT Time Calculation (min) 40 min    Equipment Utilized During Treatment Gait belt    Activity Tolerance Patient tolerated treatment well    Behavior During Therapy WFL for tasks assessed/performed                 Past Medical History:  Diagnosis Date   Allergy    Anemia    Anxiety    Anxiety and depression    related to caring for mother during terminal illness   Arthritis    Asthma    AVN (avascular necrosis of bone) (HCC)    hip and wrist   Bronchitis    hx of   Cataract    Chronic kidney disease    COPD (chronic obstructive pulmonary disease) (HCC)    Depression    Emphysema of lung (HCC)    Endometriosis    FH: CAD (coronary artery disease)    GERD (gastroesophageal reflux disease)    ocassional   History of blood transfusion    after hip repackment - broke out in hives and started itching   History of palpitations    Hyperlipidemia    Hypertension    Hypothyroid    Neuromuscular disorder (HCC)    Non-ischemic cardiomyopathy (HCC) 2019   Osteopenia    forteo through Dr. Corliss Skains (started 10/12)   Osteoporosis    PMR (polymyalgia rheumatica) (HCC)    SIRS (systemic inflammatory response syndrome) (HCC) 10/2017   Smoker    SOB (shortness of breath) on exertion    Squamous cell carcinoma    facial, 2016   Temporal arteritis (HCC)    s/p prednisone taper   Past Surgical History:  Procedure Laterality Date   ABDOMINAL ADHESION SURGERY     ABDOMINAL HYSTERECTOMY     BREAST EXCISIONAL BIOPSY Left    BREAST REDUCTION SURGERY  Bilateral 06/13/2019   Procedure: MAMMARY REDUCTION  (BREAST);  Surgeon: Allena Napoleon, MD;  Location: [facility] OR;  Service: Plastics;  Laterality: Bilateral;   BREAST SURGERY Left    benign bx 1990   CARDIAC CATHETERIZATION  2007   no PCI   CATARACT EXTRACTION Bilateral    CHOLECYSTECTOMY     COLONOSCOPY     COLONOSCOPY W/ POLYPECTOMY     INCONTINENCE SURGERY     2007    JOINT REPLACEMENT Left 2012   left hip   REDUCTION MAMMAPLASTY     2021   REVERSE SHOULDER ARTHROPLASTY Left 05/28/2020   Procedure: REVERSE SHOULDER ARTHROPLASTY;  Surgeon: Yolonda Kida, MD;  Location: [facility] OR;  Service: Orthopedics;  Laterality: Left;  2.5 hrs   TONSILLECTOMY     TOTAL HIP ARTHROPLASTY Right 10/04/2012   Procedure: TOTAL HIP ARTHROPLASTY;  Surgeon: Valeria Batman, MD;  Location: [facility] OR;  Service: Orthopedics;  Laterality: Right;   TOTAL SHOULDER REPLACEMENT Left    WRIST SURGERY Left    2012/ left wrist/ bone removed due to necrosis   Patient Active Problem  List   Diagnosis Date Noted   Bilateral lower extremity edema 05/27/2022   Frequent falls 03/05/2022   Serrated polyp of colon 03/05/2022   Mixed hyperlipidemia 12/03/2021   Unsteady gait 07/20/2021   Chronic diastolic CHF (congestive heart failure) (HCC) 06/13/2021   Upper airway cough syndrome 05/07/2021   Osteoporosis 03/23/2021   Allergic rhinitis 04/06/2019   Aortic atherosclerosis (HCC) 12/17/2017   Pulmonary nodule 12/17/2017   GERD (gastroesophageal reflux disease) 12/12/2017   COPD, severe (HCC) 11/24/2017   CKD (chronic kidney disease), stage III (HCC) 10/28/2017   Essential hypertension 06/23/2017   Polymyalgia rheumatica (HCC) 05/22/2016   History of bilateral hip replacements 05/22/2016   DDD (degenerative disc disease), lumbar 05/22/2016   History of avascular necrosis of capital femoral epiphysis 08/14/2014   Asthma 03/23/2010   Acquired hypothyroidism 03/21/2010   Chronic recurrent major depressive disorder  (HCC) 03/21/2010   Former smoker 03/21/2010   History of temporal arteritis 03/21/2010    PCP: Willow Ora, MD REFERRING PROVIDER: Willow Ora, MD  REFERRING DIAG: R26.89 (ICD-10-CM) - Poor balance R29.6 (ICD-10-CM) - Frequent falls  THERAPY DIAG:  Unsteadiness on feet  Dizziness and giddiness  ONSET DATE: 5 years ago to present  Rationale for Evaluation and Treatment: Rehabilitation  SUBJECTIVE:   SUBJECTIVE STATEMENT: I bumped that (L) knee the other night and it's been aching.   Pt accompanied by: self  PERTINENT HISTORY: history of pre-diabetes, B12 deficiency hypothyroidism, COPD (former smoker), HLD, HTN, osteoporosis, temporal arteritis, anxiety and depression  PAIN:  Are you having pain? Yes: NPRS scale: 0/10 Pain location: R shoulder Pain description: strain Aggravating factors: "I think I strained it earlier this week" Relieving factors: tylenol  PRECAUTIONS: Fall  WEIGHT BEARING RESTRICTIONS: No  FALLS: Has patient fallen in last 6 months? Yes. Number of falls 4  LIVING ENVIRONMENT: Lives with: lives alone Lives in: [place], [place] Stairs: Yes: External: flight steps; bilateral but cannot reach both Has following equipment at home: Single point cane and Walker - 4 wheeled  PLOF: Independent, Independent with basic ADLs, Independent with household mobility with device, Independent with community mobility with device, and pt reports she has a caregiver that comes 2-3x/wk to stand-by for showers and light housekeeping  PATIENT GOALS: improve balance, reduce risk for falls  OBJECTIVE:     TODAY'S TREATMENT: 07/21/22 Activity Comments  STS with cues to prevent retropulsion 10x, with red medball 10x Cues prior for set up and wider BOS. Eliminated retropulsion   backwards walking with wide BOS 4x64ft CGA; cueing to perform large steps with good stability after each step; occasional use of counter for stability   Sidestepping with yellow loop  around ankles 2x1 min Along counter; trialed donning TB loop in standing- unable to perform safely, thus encouraged pt to sit to don it when at home   fwd/back stepping   wall bumps EO/EC Focus on slow and controlled movement; c/o mild dizziness when closing eyes (but reports improved tolerance compared to previous sessions) difficulty recalling sequencing, requiring consistent verbal direction   romberg EC 30" Mild sway; "c/o dizziness or lightheaded" with EC which goes away when opening eyes   semi tandem EC 30" No dizziness; 1 fingertip on rail     HOME EXERCISE PROGRAM Last updated: 07/21/22 Access Code: [email or web address] URL: [email or web address] Date: 07/21/2022 Prepared by: [facility] Outpatient  Rehab - [facility]  Program Notes perform standing exercises in corner for safety  Exercises - Seated Gaze Stabilization with Head Rotation  -  1 x daily - 7 x weekly - 3-5 sets - 15-30 sec hold - Romberg Stance with Head Rotation  - 1 x daily - 5 x weekly - 2-3 sets - 30 sec hold - Romberg Stance with Head Nods  - 1 x daily - 5 x weekly - 2-3 sets - 30 sec hold - Romberg Stance with Eyes Closed  - 1 x daily - 5 x weekly - 2 sets - 30 sec hold - Semi-Tandem Corner Balance With Eyes Open  - 1 x daily - 7 x weekly - 3 sets - 15-30 sec hold - Sit to Stand  - 1 x daily - 5 x weekly - 2 sets - 10 reps - Backward Walking with Counter Support  - 1 x daily - 5 x weekly - 2 sets - 5 reps - Side Stepping with Counter Support  - 1 x daily - 5 x weekly - 2 sets - 10 reps   PATIENT EDUCATION: Education details: HEP update with edu for safety Person educated: Patient Education method: Explanation, Demonstration, Tactile cues, Verbal cues, and Handouts Education comprehension: verbalized understanding and returned demonstration     Below measures were taken at time of initial evaluation unless otherwise specified:   DIAGNOSTIC FINDINGS:  IMPRESSION: 1. No acute fracture or  destructive bone lesion. 2. Multilevel degenerative disc and joint changes as above. 3. Moderate C5-6 and C6-7 central canal stenosis. 4. Multilevel neuroforaminal stenosis including moderate to severe left-greater-than-right C3-4, moderate to severe right and moderate left C4-5, severe right and moderate left C5-6, and severe right and mild-to-moderate left C6-7 neuroforaminal stenosis.  COGNITION: Overall cognitive status: Within functional limits for tasks assessed   SENSATION: WFL  EDEMA:  Yes, some swelling Left > right. Wearing compression stockings  MUSCLE TONE:  WNL  DTRs:  WNL  POSTURE:  forward head  Cervical ROM:    Active A/PROM (deg) eval  Flexion WNL  Extension 50  Right lateral flexion 25  Left lateral flexion 30  Right rotation 40  Left rotation 50  (Blank rows = not tested)  STRENGTH:   LOWER EXTREMITY MMT:   MMT Right eval Left eval  Hip flexion    Hip abduction    Hip adduction    Hip internal rotation    Hip external rotation    Knee flexion    Knee extension    Ankle dorsiflexion    Ankle plantarflexion    Ankle inversion    Ankle eversion    (Blank rows = not tested)  BED MOBILITY:  indep  TRANSFERS: Assistive device utilized: Single point cane and None  Sit to stand: Complete Independence Stand to sit: Complete Independence Chair to chair: Modified independence Floor:  NT    GAIT: Gait pattern: decreased stride length Distance walked:  Assistive device utilized: Single point cane Level of assistance: Modified independence Comments:     VESTIBULAR ASSESSMENT:  GENERAL OBSERVATION: wears progressive lenses   SYMPTOM BEHAVIOR:  Subjective history: 5 year hx of vertigo and general unsteadiness  Non-Vestibular symptoms: neck pain, headaches, tinnitus, and migraine symptoms  Type of dizziness: Imbalance (Disequilibrium), Unsteady with head/body turns, and "Swimmyheaded"  Frequency: varies  Duration: seconds, with  lingering side effects x days  Aggravating factors: Induced by motion: turning body quickly and turning head quickly and Worse in the dark  Relieving factors: slow movements  Progression of symptoms: better  OCULOMOTOR EXAM:  Ocular Alignment: normal  Ocular ROM: No Limitations  Spontaneous Nystagmus: absent  Gaze-Induced Nystagmus: absent  Smooth Pursuits:  slow and off target  Saccades: hypometric/undershoots, extra eye movements, and slow  Convergence/Divergence:  very limited left eye convergence    VESTIBULAR - OCULAR REFLEX:   Slow VOR: Positive Bilaterally and Comment: saccadic intrusions horizontal > vertical  VOR Cancellation: Corrective Saccades  Head-Impulse Test: HIT Right: positive HIT Left: positive, left side with greater amplitude  Dynamic Visual Acuity: Not able to be assessed   POSITIONAL TESTING: Pt denies any positional dizziness at this time but notes with sit to stand she experiences lightheadedness.  Reports she has had orthostatics checked at several MD appointments and therapy visits without drop in BP.   MOTION SENSITIVITY:  Motion Sensitivity Quotient Intensity: 0 = none, 1 = Lightheaded, 2 = Mild, 3 = Moderate, 4 = Severe, 5 = Vomiting  Intensity  1. Sitting to supine   2. Supine to L side   3. Supine to R side   4. Supine to sitting   5. L Hallpike-Dix   6. Up from L    7. R Hallpike-Dix   8. Up from R    9. Sitting, head tipped to L knee   10. Head up from L knee   11. Sitting, head tipped to R knee   12. Head up from R knee   13. Sitting head turns x5   14.Sitting head nods x5   15. In stance, 180 turn to L    16. In stance, 180 turn to R     OTHOSTATICS: not done  FUNCTIONAL GAIT: 5 times sit to stand: 18 sec with pushing back of legs on chair Timed up and go (TUG): NT Functional gait assessment: NT  06/09/22- TUG 12.6 seconds, FGA 17  M-CTSIB  Condition 1: Firm Surface, EO 30 Sec, Mild Sway  Condition 2: Firm Surface, EC 30 Sec,  Moderate Sway  Condition 3: Foam Surface, EO 30 Sec, Mild Sway  Condition 4: Foam Surface, EC 20 Sec, Moderate and Severe Sway     VESTIBULAR TREATMENT:                                                                                                   DATE:   06/04/22  Canalith Repositioning:  Comment: not indicated at this time Gaze Adaptation:  x1 Viewing Horizontal: Position: sitting, Time: 15-30 sec, and Reps: 2 Habituation:  Other: TBD Other:   PATIENT EDUCATION: Education details: assessment fidings, rationale of intervention, HEP initiation Person educated: Patient Education method: Explanation and Handouts Education comprehension: verbalized understanding and needs further education  HOME EXERCISE PROGRAM: Access Code: [email or web address] URL: [email or web address] Date: 06/04/2022 Prepared by: Shary Decamp  Exercises - Seated Gaze Stabilization with Head Rotation  - 1 x daily - 7 x weekly - 3-5 sets - 15-30 sec hold  GOALS: Goals reviewed with patient? Yes  SHORT TERM GOALS: Target date: 06/25/2022    The patient will be independent with HEP for gaze adaptation, habituation, balance, and general mobility. Baseline: met for current 07/14/22 Goal status: IN PROGRESS 07/14/22    LONG TERM GOALS: Target date:  08/11/2022    Demo improved balance and BLE strength per time 15 sec 5xSTS test Baseline: 18 sec w/ compensation; 17.29 sec 07/14/22 Goal status: IN PROGRESS 07/14/22  2.  Manifest low risk for falls and improved mobility per score 22/28 Functional Gait Assessment Baseline: 17/30 07/14/22 Goal status: IN PROGRESS 07/14/22  3.  Demo improved postural stability and control maintaining normal-mild sway condition 4 M-CTSIB to improve safety with ADL Baseline: mod-severe x 20 sec; moderate sway for 10 sec 07/14/22 Goal status: IN PROGRESS 07/14/22  4.  Demo lower risk for falls per time 15 sec TUG test to improve safety with mobility Baseline: TBD Goal status: MET  06/09/22    ASSESSMENT:  CLINICAL IMPRESSION: Patient arrived to session with report of bumping the L knee but denied pain at start of session. Worked on reviewing STS transfers for proper form- patient demonstrated much improved stability and reduced retropulsion. Hip strengthening was initiated d/t pt's tendency to maintain narrow BOS with most tasks. Educated on logistics of donning this safely at home- patient reported understanding. Balance challenges with EC brought on c/o "dizziness or lightheadedness" which went away immediately upon opening eyes. Patient reported understanding of HEP update with edu on safe execution. No complaints upon leaving.   OBJECTIVE IMPAIRMENTS: Abnormal gait, decreased activity tolerance, decreased balance, decreased mobility, decreased ROM, decreased strength, dizziness, and postural dysfunction.   ACTIVITY LIMITATIONS: lifting, bending, standing, transfers, reach over head, and locomotion level  PARTICIPATION LIMITATIONS: meal prep, cleaning, driving, shopping, and community activity  PERSONAL FACTORS: Age, Time since onset of injury/illness/exacerbation, and 1-2 comorbidities: PMH  are also affecting patient's functional outcome.   REHAB POTENTIAL: Good  CLINICAL DECISION MAKING: Evolving/moderate complexity  EVALUATION COMPLEXITY: Moderate   PLAN:  PT FREQUENCY: 1x/week  PT DURATION: 4 weeks  PLANNED INTERVENTIONS: Therapeutic exercises, Therapeutic activity, Neuromuscular re-education, Balance training, Gait training, Patient/Family education, Self Care, Joint mobilization, Stair training, Vestibular training, Canalith repositioning, Orthotic/Fit training, DME instructions, Aquatic Therapy, Dry Needling, Electrical stimulation, Spinal mobilization, Taping, and Manual therapy  PLAN FOR NEXT SESSION: work on transfer form and safety to prevent retropulsion; balance activities with reminders on wider BOS with gait and standing; balance challenges  like EC, narrow BOS, head movements    Anette Guarneri, PT, DPT 07/21/22 11:46 AM  [facility] at [facility] [address], [address] [place], [place] [address] Phone # [phone] Fax # [phone]

## 2022-07-21 ENCOUNTER — Encounter: Payer: Self-pay | Admitting: Internal Medicine

## 2022-07-21 ENCOUNTER — Ambulatory Visit: Payer: Medicare Other | Admitting: Family

## 2022-07-21 ENCOUNTER — Encounter: Payer: Self-pay | Admitting: Physical Therapy

## 2022-07-21 ENCOUNTER — Ambulatory Visit: Payer: Medicare Other | Admitting: Physical Therapy

## 2022-07-21 ENCOUNTER — Ambulatory Visit (INDEPENDENT_AMBULATORY_CARE_PROVIDER_SITE_OTHER): Payer: Medicare Other | Admitting: Internal Medicine

## 2022-07-21 VITALS — BP 120/84 | HR 76 | Temp 98.1°F | Wt 167.7 lb

## 2022-07-21 DIAGNOSIS — R42 Dizziness and giddiness: Secondary | ICD-10-CM | POA: Diagnosis not present

## 2022-07-21 DIAGNOSIS — R2681 Unsteadiness on feet: Secondary | ICD-10-CM

## 2022-07-21 DIAGNOSIS — R253 Fasciculation: Secondary | ICD-10-CM

## 2022-07-21 DIAGNOSIS — E039 Hypothyroidism, unspecified: Secondary | ICD-10-CM | POA: Diagnosis not present

## 2022-07-21 LAB — COMPREHENSIVE METABOLIC PANEL
ALT: 12 U/L (ref 0–35)
AST: 18 U/L (ref 0–37)
Albumin: 4.6 g/dL (ref 3.5–5.2)
Alkaline Phosphatase: 79 U/L (ref 39–117)
BUN: 23 mg/dL (ref 6–23)
CO2: 31 mEq/L (ref 19–32)
Calcium: 10 mg/dL (ref 8.4–10.5)
Chloride: 102 mEq/L (ref 96–112)
Creatinine, Ser: 1.28 mg/dL — ABNORMAL HIGH (ref 0.40–1.20)
GFR: 42.79 mL/min — ABNORMAL LOW (ref >60.00)
Glucose, Bld: 84 mg/dL (ref 70–99)
Potassium: 4.1 mEq/L (ref 3.5–5.1)
Sodium: 139 mEq/L (ref 135–145)
Total Bilirubin: 0.6 mg/dL (ref 0.2–1.2)
Total Protein: 7.6 g/dL (ref 6.0–8.3)

## 2022-07-21 LAB — TSH: TSH: 1.04 u[IU]/mL (ref 0.35–5.50)

## 2022-07-21 LAB — MAGNESIUM: Magnesium: 2 mg/dL (ref 1.5–2.5)

## 2022-07-21 NOTE — Progress Notes (Signed)
Established Patient Office Visit     CC/Reason for Visit: Muscle fasciculations  HPI: April [name] is a 67 y.o. female who is coming in today for the above mentioned reasons.  For the past 3 to 4 days has been having abdominal wall and gluteus muscle fasciculations.  Not painful.   Past Medical/Surgical History: Past Medical History:  Diagnosis Date   Allergy    Anemia    Anxiety    Anxiety and depression    related to caring for mother during terminal illness   Arthritis    Asthma    AVN (avascular necrosis of bone) (HCC)    hip and wrist   Bronchitis    hx of   Cataract    Chronic kidney disease    COPD (chronic obstructive pulmonary disease) (HCC)    Depression    Emphysema of lung (HCC)    Endometriosis    FH: CAD (coronary artery disease)    GERD (gastroesophageal reflux disease)    ocassional   History of blood transfusion    after hip repackment - broke out in hives and started itching   History of palpitations    Hyperlipidemia    Hypertension    Hypothyroid    Neuromuscular disorder (HCC)    Non-ischemic cardiomyopathy (HCC) 2019   Osteopenia    forteo through Dr. Corliss Skains (started 10/12)   Osteoporosis    PMR (polymyalgia rheumatica) (HCC)    SIRS (systemic inflammatory response syndrome) (HCC) 10/2017   Smoker    SOB (shortness of breath) on exertion    Squamous cell carcinoma    facial, 2016   Temporal arteritis (HCC)    s/p prednisone taper    Past Surgical History:  Procedure Laterality Date   ABDOMINAL ADHESION SURGERY     ABDOMINAL HYSTERECTOMY     BREAST EXCISIONAL BIOPSY Left    BREAST REDUCTION SURGERY Bilateral 06/13/2019   Procedure: MAMMARY REDUCTION  (BREAST);  Surgeon: Allena Napoleon, MD;  Location: [facility] OR;  Service: Plastics;  Laterality: Bilateral;   BREAST SURGERY Left    benign bx 1990   CARDIAC CATHETERIZATION  2007   no PCI   CATARACT EXTRACTION Bilateral    CHOLECYSTECTOMY     COLONOSCOPY     COLONOSCOPY  W/ POLYPECTOMY     INCONTINENCE SURGERY     2007    JOINT REPLACEMENT Left 2012   left hip   REDUCTION MAMMAPLASTY     2021   REVERSE SHOULDER ARTHROPLASTY Left 05/28/2020   Procedure: REVERSE SHOULDER ARTHROPLASTY;  Surgeon: Yolonda Kida, MD;  Location: [facility] OR;  Service: Orthopedics;  Laterality: Left;  2.5 hrs   TONSILLECTOMY     TOTAL HIP ARTHROPLASTY Right 10/04/2012   Procedure: TOTAL HIP ARTHROPLASTY;  Surgeon: Valeria Batman, MD;  Location: [facility] OR;  Service: Orthopedics;  Laterality: Right;   TOTAL SHOULDER REPLACEMENT Left    WRIST SURGERY Left    2012/ left wrist/ bone removed due to necrosis    Social History:  reports that she quit smoking about 4 years ago. Her smoking use included cigarettes. She started smoking about 38 years ago. She has a 11.2 pack-year smoking history. She has been exposed to tobacco smoke. She has never used smokeless tobacco. She reports current alcohol use. She reports that she does not use drugs.  Allergies: Allergies  Allergen Reactions   Sulfonamide Derivatives     As a child.  Throat closed, rash.   Alendronate  Sodium     GI upset   Dilaudid [Hydromorphone Hcl] Nausea And Vomiting   Sulfa Antibiotics Hives   Other Other (See Comments)    Adhesive    Wound Dressing Adhesive Other (See Comments)    Redness, adhesive tapes. Needs PAPER TAPE.    Family History:  Family History  Problem Relation Age of Onset   Heart disease Mother    Hypertension Mother    Alzheimer's disease Mother    Colon cancer Mother    Kidney failure Mother    Arthritis Brother    Suicidality Brother    Colon polyps Brother    Breast cancer Maternal Aunt    Healthy Son    Esophageal cancer Neg Hx    Stomach cancer Neg Hx    Rectal cancer Neg Hx    Crohn's disease Neg Hx      Current Outpatient Medications:    acetaminophen (TYLENOL) 500 MG tablet, Take 1,000 mg by mouth every 6 (six) hours as needed for moderate pain or headache., Disp: ,  Rfl:    albuterol (VENTOLIN HFA) 108 (90 Base) MCG/ACT inhaler, Inhale 2 puffs into the lungs every 6 (six) hours as needed for wheezing or shortness of breath. Okay to dispense proair/Ventolin/albuterol., Disp: 8 g, Rfl: 5   ALPRAZolam (XANAX) 0.5 MG tablet, TAKE 1/2 TO 1 TABLET BY MOUTH TWICE DAILY AS NEEDED FOR ANXIETY, Disp: 60 tablet, Rfl: 2   azithromycin (ZITHROMAX) 250 MG tablet, Take 1 tablet (250 mg total) by mouth every Monday, Wednesday, and Friday., Disp: 36 tablet, Rfl: 3   Budeson-Glycopyrrol-Formoterol (BREZTRI AEROSPHERE) 160-9-4.8 MCG/ACT AERO, Inhale 2 puffs into the lungs every 12 (twelve) hours., Disp: 32.1 g, Rfl: 3   buPROPion (WELLBUTRIN XL) 300 MG 24 hr tablet, TAKE 1 TABLET BY MOUTH EVERY DAY, Disp: 90 tablet, Rfl: 3   Calcium 500 MG CHEW, Chew 500 mg by mouth 2 (two) times daily., Disp: , Rfl:    Cholecalciferol 250 MCG (10000 UT) CAPS, Take by mouth., Disp: , Rfl:    Cyanocobalamin (VITAMIN B 12 PO), Take by mouth., Disp: , Rfl:    furosemide (LASIX) 20 MG tablet, Take 1 tablet (20 mg total) by mouth daily as needed for edema. Take with potassium, Disp: 30 tablet, Rfl: 3   ibandronate (BONIVA) 150 MG tablet, TAKE 1 TABLET BY MOUTH EACH MONTH WITH 8oz OF plain WATER 60 mins BEFORE first food, drink, OR meds. stay upright FOR 60 MINUTES., Disp: 4 tablet, Rfl: 0   levothyroxine (SYNTHROID) 100 MCG tablet, Take 1 tablet (100 mcg total) by mouth daily., Disp: 90 tablet, Rfl: 3   metoprolol succinate (TOPROL-XL) 25 MG 24 hr tablet, TAKE 1 TABLET BY MOUTH EVERY DAY, Disp: 100 tablet, Rfl: 0   omeprazole (PRILOSEC) 20 MG capsule, TAKE 1 CAPSULE BY MOUTH EVERY DAY, Disp: 90 capsule, Rfl: 3   Polyethyl Glycol-Propyl Glycol (SYSTANE OP), Place 1 drop into both eyes as needed., Disp: , Rfl:    potassium chloride SA (KLOR-CON M) 10 MEQ tablet, Take 1 tablet (10 mEq total) by mouth daily as needed (leg swelling). Take with furosemide, Disp: 30 tablet, Rfl: 3   rosuvastatin (CRESTOR) 10  MG tablet, TAKE 1 TABLET BY MOUTH DAILY, Disp: 90 tablet, Rfl: 3   Spacer/Aero-Holding Chambers (AEROCHAMBER MV) inhaler, Use as instructed, Disp: 1 each, Rfl: 0   tiZANidine (ZANAFLEX) 4 MG tablet, TAKE HALF TO ONE TABLET BY MOUTH EVERY 6 HOURS AS NEEDED FOR MUSCLE SPASMS, Disp: 90 tablet, Rfl:  0   valsartan (DIOVAN) 40 MG tablet, TAKE 1 TABLET BY MOUTH EVERY DAY, Disp: 100 tablet, Rfl: 0   vitamin C (ASCORBIC ACID) 250 MG tablet, Take 500 mg by mouth daily., Disp: , Rfl:   Review of Systems:  Negative unless indicated in HPI.   Physical Exam: Vitals:   07/21/22 0940  BP: 120/84  Pulse: 76  Temp: 98.1 F (36.7 C)  TempSrc: Oral  SpO2: 96%  Weight: 167 lb 11.2 oz (76.1 kg)    Body mass index is 26.66 kg/m.   Physical Exam Vitals reviewed.  Constitutional:      Appearance: Normal appearance.  HENT:     Head: Normocephalic and atraumatic.  Eyes:     Conjunctiva/sclera: Conjunctivae normal.     Pupils: Pupils are equal, round, and reactive to light.  Skin:    General: Skin is warm and dry.  Neurological:     Mental Status: She is alert and oriented to person, place, and time.  Psychiatric:        Mood and Affect: Mood normal.        Behavior: Behavior normal.        Thought Content: Thought content normal.        Judgment: Judgment normal.      Impression and Plan:  Fasciculations of muscle -     Comprehensive metabolic panel; Future -     Magnesium; Future -     TSH; Future  Acquired hypothyroidism -     TSH; Future   -Rule out electrolyte deficiencies.  Her levothyroxine dose was reduced in May due to a low TSH.  This could be causing her fasciculations as well if her TSH is still low.  Follow-up after lab results  Time spent:23 minutes reviewing chart, interviewing and examining patient and formulating plan of care.     Chaya Jan, MD [facility] at [facility]

## 2022-07-21 NOTE — Progress Notes (Signed)
Please call patient: Please let her know that her current dose of levothyroxine 100 mcg daily is correct with her most recent TSH being normal now.

## 2022-07-22 ENCOUNTER — Other Ambulatory Visit: Payer: Self-pay | Admitting: Family Medicine

## 2022-07-22 DIAGNOSIS — M6289 Other specified disorders of muscle: Secondary | ICD-10-CM

## 2022-07-22 DIAGNOSIS — M62838 Other muscle spasm: Secondary | ICD-10-CM

## 2022-07-23 ENCOUNTER — Telehealth: Payer: Self-pay | Admitting: Family Medicine

## 2022-07-23 NOTE — Telephone Encounter (Signed)
Left message on machine returning patient's call.  See lab result note. 

## 2022-07-23 NOTE — Telephone Encounter (Signed)
Pt returning a call from R. Vereen regarding lab result

## 2022-07-28 ENCOUNTER — Ambulatory Visit: Payer: Medicare Other | Admitting: Physical Therapy

## 2022-08-03 NOTE — Therapy (Signed)
OUTPATIENT PHYSICAL THERAPY VESTIBULAR TREATMENT     Patient Name: April [name] MRN: [id number] DOB:04-03-1955, 67 y.o., female Today's Date: 08/04/2022  END OF SESSION:  PT End of Session - 08/04/22 1144     Visit Number 7    Number of Visits 9    Date for PT Re-Evaluation 08/11/22    Authorization Type [employer]    Progress Note Due on Visit 10    PT Start Time 1105    PT Stop Time 1143    PT Time Calculation (min) 38 min    Equipment Utilized During Treatment Gait belt    Activity Tolerance Patient tolerated treatment well    Behavior During Therapy WFL for tasks assessed/performed                  Past Medical History:  Diagnosis Date   Allergy    Anemia    Anxiety    Anxiety and depression    related to caring for mother during terminal illness   Arthritis    Asthma    AVN (avascular necrosis of bone) (HCC)    hip and wrist   Bronchitis    hx of   Cataract    Chronic kidney disease    COPD (chronic obstructive pulmonary disease) (HCC)    Depression    Emphysema of lung (HCC)    Endometriosis    FH: CAD (coronary artery disease)    GERD (gastroesophageal reflux disease)    ocassional   History of blood transfusion    after hip repackment - broke out in hives and started itching   History of palpitations    Hyperlipidemia    Hypertension    Hypothyroid    Neuromuscular disorder (HCC)    Non-ischemic cardiomyopathy (HCC) 2019   Osteopenia    forteo through Dr. Corliss Skains (started 10/12)   Osteoporosis    PMR (polymyalgia rheumatica) (HCC)    SIRS (systemic inflammatory response syndrome) (HCC) 10/2017   Smoker    SOB (shortness of breath) on exertion    Squamous cell carcinoma    facial, 2016   Temporal arteritis (HCC)    s/p prednisone taper   Past Surgical History:  Procedure Laterality Date   ABDOMINAL ADHESION SURGERY     ABDOMINAL HYSTERECTOMY     BREAST EXCISIONAL BIOPSY Left    BREAST REDUCTION SURGERY  Bilateral 06/13/2019   Procedure: MAMMARY REDUCTION  (BREAST);  Surgeon: Allena Napoleon, MD;  Location: [facility] OR;  Service: Plastics;  Laterality: Bilateral;   BREAST SURGERY Left    benign bx 1990   CARDIAC CATHETERIZATION  2007   no PCI   CATARACT EXTRACTION Bilateral    CHOLECYSTECTOMY     COLONOSCOPY     COLONOSCOPY W/ POLYPECTOMY     INCONTINENCE SURGERY     2007    JOINT REPLACEMENT Left 2012   left hip   REDUCTION MAMMAPLASTY     2021   REVERSE SHOULDER ARTHROPLASTY Left 05/28/2020   Procedure: REVERSE SHOULDER ARTHROPLASTY;  Surgeon: Yolonda Kida, MD;  Location: [facility] OR;  Service: Orthopedics;  Laterality: Left;  2.5 hrs   TONSILLECTOMY     TOTAL HIP ARTHROPLASTY Right 10/04/2012   Procedure: TOTAL HIP ARTHROPLASTY;  Surgeon: Valeria Batman, MD;  Location: [facility] OR;  Service: Orthopedics;  Laterality: Right;   TOTAL SHOULDER REPLACEMENT Left    WRIST SURGERY Left    2012/ left wrist/ bone removed due to necrosis   Patient Active  Problem List   Diagnosis Date Noted   Bilateral lower extremity edema 05/27/2022   Frequent falls 03/05/2022   Serrated polyp of colon 03/05/2022   Mixed hyperlipidemia 12/03/2021   Unsteady gait 07/20/2021   Chronic diastolic CHF (congestive heart failure) (HCC) 06/13/2021   Upper airway cough syndrome 05/07/2021   Osteoporosis 03/23/2021   Allergic rhinitis 04/06/2019   Aortic atherosclerosis (HCC) 12/17/2017   Pulmonary nodule 12/17/2017   GERD (gastroesophageal reflux disease) 12/12/2017   COPD, severe (HCC) 11/24/2017   CKD (chronic kidney disease), stage III (HCC) 10/28/2017   Essential hypertension 06/23/2017   Polymyalgia rheumatica (HCC) 05/22/2016   History of bilateral hip replacements 05/22/2016   DDD (degenerative disc disease), lumbar 05/22/2016   History of avascular necrosis of capital femoral epiphysis 08/14/2014   Asthma 03/23/2010   Acquired hypothyroidism 03/21/2010   Chronic recurrent major depressive disorder  (HCC) 03/21/2010   Former smoker 03/21/2010   History of temporal arteritis 03/21/2010    PCP: Willow Ora, MD REFERRING PROVIDER: Willow Ora, MD  REFERRING DIAG: R26.89 (ICD-10-CM) - Poor balance R29.6 (ICD-10-CM) - Frequent falls  THERAPY DIAG:  Unsteadiness on feet  Dizziness and giddiness  ONSET DATE: 5 years ago to present  Rationale for Evaluation and Treatment: Rehabilitation  SUBJECTIVE:   SUBJECTIVE STATEMENT: "I'm fine." Reports the R shoulder has been bothering me a little bit; admits "I have a tendency to lift things more than I should."  Pt accompanied by: self  PERTINENT HISTORY: history of pre-diabetes, B12 deficiency hypothyroidism, COPD (former smoker), HLD, HTN, osteoporosis, temporal arteritis, anxiety and depression  PAIN:  Are you having pain? Yes: NPRS scale: 0/10 Pain location: R shoulder Pain description: strain Aggravating factors: "I think I strained it earlier this week" Relieving factors: tylenol  PRECAUTIONS: Fall  WEIGHT BEARING RESTRICTIONS: No  FALLS: Has patient fallen in last 6 months? Yes. Number of falls 4  LIVING ENVIRONMENT: Lives with: lives alone Lives in: [place], [place] Stairs: Yes: External: flight steps; bilateral but cannot reach both Has following equipment at home: Single point cane and Walker - 4 wheeled  PLOF: Independent, Independent with basic ADLs, Independent with household mobility with device, Independent with community mobility with device, and pt reports she has a caregiver that comes 2-3x/wk to stand-by for showers and light housekeeping  PATIENT GOALS: improve balance, reduce risk for falls  OBJECTIVE:     TODAY'S TREATMENT: 08/04/22 Activity Comments  STS 3x5  Only 1 episode of retropulsion; last 2 sets with green medball   sidestepping with yellow TB 2x 1 min Cues to maintain hips neutral  Sitting KTOS 30" Sitting HS stretch 30" Instruction and demo; good tolerance   Standing hip ABD  with yellow TB 10x  Cueing for upright posture without lean   backwards walking with wide BOS Cueing to step diagonally to avoid scissoring ; min A for balance   fwd/back stepping  Difficulty with carryover of wider BOS; improved with visual targets   1/2 turns + alt toe tap on cone  In II bars; mild imbalance and occasional UE support      HOME EXERCISE PROGRAM Access Code: [email or web address] URL: [email or web address] Date: 08/04/2022 Prepared by: [facility] - [facility]  Program Notes perform standing exercises in corner for safety  Exercises - Seated Gaze Stabilization with Head Rotation  - 1 x daily - 7 x weekly - 3-5 sets - 15-30 sec hold - Romberg Stance with Head Rotation  - 1  x daily - 5 x weekly - 2-3 sets - 30 sec hold - Romberg Stance with Head Nods  - 1 x daily - 5 x weekly - 2-3 sets - 30 sec hold - Romberg Stance with Eyes Closed  - 1 x daily - 5 x weekly - 2 sets - 30 sec hold - Semi-Tandem Corner Balance With Eyes Open  - 1 x daily - 7 x weekly - 3 sets - 15-30 sec hold - Sit to Stand  - 1 x daily - 5 x weekly - 2 sets - 10 reps - Backward Walking with Counter Support  - 1 x daily - 5 x weekly - 2 sets - 5 reps - Side Stepping with Counter Support  - 1 x daily - 5 x weekly - 2 sets - 10 reps - Standing Hip Abduction with Resistance at Ankles and Counter Support  - 1 x daily - 5 x weekly - 2 sets - 10 reps   PATIENT EDUCATION: Education details: HEP update with edu for safety Person educated: Patient Education method: Explanation, Demonstration, Tactile cues, Verbal cues, and Handouts Education comprehension: verbalized understanding and returned demonstration       Below measures were taken at time of initial evaluation unless otherwise specified:   DIAGNOSTIC FINDINGS:  IMPRESSION: 1. No acute fracture or destructive bone lesion. 2. Multilevel degenerative disc and joint changes as above. 3. Moderate C5-6 and C6-7 central  canal stenosis. 4. Multilevel neuroforaminal stenosis including moderate to severe left-greater-than-right C3-4, moderate to severe right and moderate left C4-5, severe right and moderate left C5-6, and severe right and mild-to-moderate left C6-7 neuroforaminal stenosis.  COGNITION: Overall cognitive status: Within functional limits for tasks assessed   SENSATION: WFL  EDEMA:  Yes, some swelling Left > right. Wearing compression stockings  MUSCLE TONE:  WNL  DTRs:  WNL  POSTURE:  forward head  Cervical ROM:    Active A/PROM (deg) eval  Flexion WNL  Extension 50  Right lateral flexion 25  Left lateral flexion 30  Right rotation 40  Left rotation 50  (Blank rows = not tested)  STRENGTH:   LOWER EXTREMITY MMT:   MMT Right eval Left eval  Hip flexion    Hip abduction    Hip adduction    Hip internal rotation    Hip external rotation    Knee flexion    Knee extension    Ankle dorsiflexion    Ankle plantarflexion    Ankle inversion    Ankle eversion    (Blank rows = not tested)  BED MOBILITY:  indep  TRANSFERS: Assistive device utilized: Single point cane and None  Sit to stand: Complete Independence Stand to sit: Complete Independence Chair to chair: Modified independence Floor:  NT    GAIT: Gait pattern: decreased stride length Distance walked:  Assistive device utilized: Single point cane Level of assistance: Modified independence Comments:     VESTIBULAR ASSESSMENT:  GENERAL OBSERVATION: wears progressive lenses   SYMPTOM BEHAVIOR:  Subjective history: 5 year hx of vertigo and general unsteadiness  Non-Vestibular symptoms: neck pain, headaches, tinnitus, and migraine symptoms  Type of dizziness: Imbalance (Disequilibrium), Unsteady with head/body turns, and "Swimmyheaded"  Frequency: varies  Duration: seconds, with lingering side effects x days  Aggravating factors: Induced by motion: turning body quickly and turning head quickly  and Worse in the dark  Relieving factors: slow movements  Progression of symptoms: better  OCULOMOTOR EXAM:  Ocular Alignment: normal  Ocular ROM: No Limitations  Spontaneous Nystagmus: absent  Gaze-Induced Nystagmus: absent  Smooth Pursuits:  slow and off target  Saccades: hypometric/undershoots, extra eye movements, and slow  Convergence/Divergence:  very limited left eye convergence    VESTIBULAR - OCULAR REFLEX:   Slow VOR: Positive Bilaterally and Comment: saccadic intrusions horizontal > vertical  VOR Cancellation: Corrective Saccades  Head-Impulse Test: HIT Right: positive HIT Left: positive, left side with greater amplitude  Dynamic Visual Acuity: Not able to be assessed   POSITIONAL TESTING: Pt denies any positional dizziness at this time but notes with sit to stand she experiences lightheadedness.  Reports she has had orthostatics checked at several MD appointments and therapy visits without drop in BP.   MOTION SENSITIVITY:  Motion Sensitivity Quotient Intensity: 0 = none, 1 = Lightheaded, 2 = Mild, 3 = Moderate, 4 = Severe, 5 = Vomiting  Intensity  1. Sitting to supine   2. Supine to L side   3. Supine to R side   4. Supine to sitting   5. L Hallpike-Dix   6. Up from L    7. R Hallpike-Dix   8. Up from R    9. Sitting, head tipped to L knee   10. Head up from L knee   11. Sitting, head tipped to R knee   12. Head up from R knee   13. Sitting head turns x5   14.Sitting head nods x5   15. In stance, 180 turn to L    16. In stance, 180 turn to R     OTHOSTATICS: not done  FUNCTIONAL GAIT: 5 times sit to stand: 18 sec with pushing back of legs on chair Timed up and go (TUG): NT Functional gait assessment: NT  06/09/22- TUG 12.6 seconds, FGA 17  M-CTSIB  Condition 1: Firm Surface, EO 30 Sec, Mild Sway  Condition 2: Firm Surface, EC 30 Sec, Moderate Sway  Condition 3: Foam Surface, EO 30 Sec, Mild Sway  Condition 4: Foam Surface, EC 20 Sec, Moderate and  Severe Sway     VESTIBULAR TREATMENT:                                                                                                   DATE:   06/04/22  Canalith Repositioning:  Comment: not indicated at this time Gaze Adaptation:  x1 Viewing Horizontal: Position: sitting, Time: 15-30 sec, and Reps: 2 Habituation:  Other: TBD Other:   PATIENT EDUCATION: Education details: assessment fidings, rationale of intervention, HEP initiation Person educated: Patient Education method: Explanation and Handouts Education comprehension: verbalized understanding and needs further education  HOME EXERCISE PROGRAM: Access Code: [email or web address] URL: [email or web address] Date: 06/04/2022 Prepared by: Shary Decamp  Exercises - Seated Gaze Stabilization with Head Rotation  - 1 x daily - 7 x weekly - 3-5 sets - 15-30 sec hold  GOALS: Goals reviewed with patient? Yes  SHORT TERM GOALS: Target date: 06/25/2022    The patient will be independent with HEP for gaze adaptation, habituation, balance, and general mobility. Baseline: met for current 07/14/22 Goal status: IN PROGRESS 07/14/22    LONG  TERM GOALS: Target date: 08/11/2022    Demo improved balance and BLE strength per time 15 sec 5xSTS test Baseline: 18 sec w/ compensation; 17.29 sec 07/14/22 Goal status: IN PROGRESS 07/14/22  2.  Manifest low risk for falls and improved mobility per score 22/28 Functional Gait Assessment Baseline: 17/30 07/14/22 Goal status: IN PROGRESS 07/14/22  3.  Demo improved postural stability and control maintaining normal-mild sway condition 4 M-CTSIB to improve safety with ADL Baseline: mod-severe x 20 sec; moderate sway for 10 sec 07/14/22 Goal status: IN PROGRESS 07/14/22  4.  Demo lower risk for falls per time 15 sec TUG test to improve safety with mobility Baseline: TBD Goal status: MET 06/09/22    ASSESSMENT:  CLINICAL IMPRESSION: Patient arrived to session without new complaints. Worked on weighted  STS transfers with patient demonstrating less frequent retropulsion than before. Lateral hip strengthening was tolerated well and updated into HEP to assist with widened BOS with activities. Patient demonstrated difficulty today maintaining wide BOS with balance activities; improved with use of visual targets. Balance activities today focused on turns, backwards walking, and stepping strategy. Patient requires continued cues and occasional min A for safety with balance tasks. No complaints upon leaving.   OBJECTIVE IMPAIRMENTS: Abnormal gait, decreased activity tolerance, decreased balance, decreased mobility, decreased ROM, decreased strength, dizziness, and postural dysfunction.   ACTIVITY LIMITATIONS: lifting, bending, standing, transfers, reach over head, and locomotion level  PARTICIPATION LIMITATIONS: meal prep, cleaning, driving, shopping, and community activity  PERSONAL FACTORS: Age, Time since onset of injury/illness/exacerbation, and 1-2 comorbidities: PMH  are also affecting patient's functional outcome.   REHAB POTENTIAL: Good  CLINICAL DECISION MAKING: Evolving/moderate complexity  EVALUATION COMPLEXITY: Moderate   PLAN:  PT FREQUENCY: 1x/week  PT DURATION: 4 weeks  PLANNED INTERVENTIONS: Therapeutic exercises, Therapeutic activity, Neuromuscular re-education, Balance training, Gait training, Patient/Family education, Self Care, Joint mobilization, Stair training, Vestibular training, Canalith repositioning, Orthotic/Fit training, DME instructions, Aquatic Therapy, Dry Needling, Electrical stimulation, Spinal mobilization, Taping, and Manual therapy  PLAN FOR NEXT SESSION: work on transfer form and safety to prevent retropulsion; balance activities with reminders on wider BOS with gait and standing; balance challenges like EC, narrow BOS, head movements    Anette Guarneri, PT, DPT 08/04/22 11:46 AM  [facility] [address] [place], [place] [address] Phone # [phone] Fax # [phone]

## 2022-08-04 ENCOUNTER — Encounter: Payer: Self-pay | Admitting: Physical Therapy

## 2022-08-04 ENCOUNTER — Ambulatory Visit: Payer: Medicare Other | Admitting: Physical Therapy

## 2022-08-04 DIAGNOSIS — R2681 Unsteadiness on feet: Secondary | ICD-10-CM | POA: Diagnosis not present

## 2022-08-04 DIAGNOSIS — R42 Dizziness and giddiness: Secondary | ICD-10-CM

## 2022-08-06 ENCOUNTER — Ambulatory Visit (INDEPENDENT_AMBULATORY_CARE_PROVIDER_SITE_OTHER): Payer: Medicare Other | Admitting: Family Medicine

## 2022-08-06 ENCOUNTER — Encounter: Payer: Self-pay | Admitting: Family Medicine

## 2022-08-06 VITALS — BP 130/84 | HR 75 | Temp 97.6°F | Ht 66.5 in | Wt 173.0 lb

## 2022-08-06 DIAGNOSIS — I10 Essential (primary) hypertension: Secondary | ICD-10-CM | POA: Diagnosis not present

## 2022-08-06 DIAGNOSIS — I5032 Chronic diastolic (congestive) heart failure: Secondary | ICD-10-CM

## 2022-08-06 DIAGNOSIS — N1831 Chronic kidney disease, stage 3a: Secondary | ICD-10-CM | POA: Diagnosis not present

## 2022-08-06 LAB — RENAL FUNCTION PANEL
Albumin: 4.3 g/dL (ref 3.5–5.2)
BUN: 23 mg/dL (ref 6–23)
CO2: 29 mEq/L (ref 19–32)
Calcium: 8.9 mg/dL (ref 8.4–10.5)
Chloride: 102 mEq/L (ref 96–112)
Creatinine, Ser: 1.15 mg/dL (ref 0.40–1.20)
GFR: 48.64 mL/min — ABNORMAL LOW (ref >60.00)
Glucose, Bld: 82 mg/dL (ref 70–99)
Phosphorus: 3.6 mg/dL (ref 2.3–4.6)
Potassium: 3.9 mEq/L (ref 3.5–5.1)
Sodium: 139 mEq/L (ref 135–145)

## 2022-08-06 NOTE — Progress Notes (Signed)
Subjective  CC:  Chief Complaint  Patient presents with   labs results    Pt stated that she had bloodwork done over at [facility] about 3 weeks ago and was informed that her kidney function was out of range    HPI: April [name] is a 67 y.o. female who presents to the office today to address the problems listed above in the chief complaint. Reviewed recent note.  Patient reports a funny sensation in her upper abdomen and mid chest described as feeling like her esophagus was contracting.  Denies palpitations, chest pain or heartburn.  She does have a history of silent reflux causing hoarseness and responsive to omeprazole which she currently takes.  Since her symptoms have resolved.  Lab work showed a mildly elevated creatinine and decreased GFR.  I reviewed her chart, several years back.  Creatinine has fluctuated, was mildly elevated back in 2019 when she was hospitalized for sepsis however it has improved to near normal since.  CKD was put on her problem list but she is uncertain of that diagnosis.  Has never seen nephrology.  No lower extremity edema.  Feels well. Chronic diastolic heart failure with rare Lasix use however she does report that prior to that acute visit she was taking Lasix daily.  Since she is taking it about twice a week.  No lower extremity edema or shortness of breath. Blood pressure has been stable. Assessment  1. Stage 3a chronic kidney disease (HCC)   2. Essential hypertension   3. Chronic diastolic CHF (congestive heart failure) (HCC)      Plan  Elevated creatinine: Unclear if she has chronic kidney disease or not.  Will monitor and trend.  Possibly mild prerenal changes due to dehydration, heat and Lasix use.  Will recheck today.  Education given. Monitor blood pressure.  Fairly stable, diastolic mildly elevated today. Chronic heart failure well-controlled with as needed Lasix.  Follow up: For complete physical in November 12/01/2022  Orders Placed This  Encounter  Procedures   Renal function panel   No orders of the defined types were placed in this encounter.     I reviewed the patients updated PMH, FH, and SocHx.    Patient Active Problem List   Diagnosis Date Noted   Mixed hyperlipidemia 12/03/2021    Priority: High   Chronic diastolic CHF (congestive heart failure) (HCC) 06/13/2021    Priority: High   COPD, severe (HCC) 11/24/2017    Priority: High   CKD (chronic kidney disease), stage III (HCC) 10/28/2017    Priority: High   Essential hypertension 06/23/2017    Priority: High   Polymyalgia rheumatica (HCC) 05/22/2016    Priority: High   Asthma 03/23/2010    Priority: High   Acquired hypothyroidism 03/21/2010    Priority: High   Chronic recurrent major depressive disorder (HCC) 03/21/2010    Priority: High   Upper airway cough syndrome 05/07/2021    Priority: Medium    Osteoporosis 03/23/2021    Priority: Medium    Aortic atherosclerosis (HCC) 12/17/2017    Priority: Medium    Pulmonary nodule 12/17/2017    Priority: Medium    GERD (gastroesophageal reflux disease) 12/12/2017    Priority: Medium    History of bilateral hip replacements 05/22/2016    Priority: Medium    DDD (degenerative disc disease), lumbar 05/22/2016    Priority: Medium    Former smoker 03/21/2010    Priority: Medium    History of temporal arteritis 03/21/2010  Priority: Medium    Allergic rhinitis 04/06/2019    Priority: Low   History of avascular necrosis of capital femoral epiphysis 08/14/2014    Priority: Low   Bilateral lower extremity edema 05/27/2022   Frequent falls 03/05/2022   Serrated polyp of colon 03/05/2022   Unsteady gait 07/20/2021   Current Meds  Medication Sig   acetaminophen (TYLENOL) 500 MG tablet Take 1,000 mg by mouth every 6 (six) hours as needed for moderate pain or headache.   albuterol (VENTOLIN HFA) 108 (90 Base) MCG/ACT inhaler Inhale 2 puffs into the lungs every 6 (six) hours as needed for wheezing or  shortness of breath. Okay to dispense proair/Ventolin/albuterol.   ALPRAZolam (XANAX) 0.5 MG tablet TAKE 1/2 TO 1 TABLET BY MOUTH TWICE DAILY AS NEEDED FOR ANXIETY   azithromycin (ZITHROMAX) 250 MG tablet Take 1 tablet (250 mg total) by mouth every Monday, Wednesday, and Friday.   Budeson-Glycopyrrol-Formoterol (BREZTRI AEROSPHERE) 160-9-4.8 MCG/ACT AERO Inhale 2 puffs into the lungs every 12 (twelve) hours.   buPROPion (WELLBUTRIN XL) 300 MG 24 hr tablet TAKE 1 TABLET BY MOUTH EVERY DAY   Calcium 500 MG CHEW Chew 500 mg by mouth 2 (two) times daily.   Cholecalciferol 250 MCG (10000 UT) CAPS Take by mouth.   Cyanocobalamin (VITAMIN B 12 PO) Take by mouth.   furosemide (LASIX) 20 MG tablet Take 1 tablet (20 mg total) by mouth daily as needed for edema. Take with potassium   ibandronate (BONIVA) 150 MG tablet TAKE 1 TABLET BY MOUTH EACH MONTH WITH 8oz OF plain WATER 60 mins BEFORE first food, drink, OR meds. stay upright FOR 60 MINUTES.   levothyroxine (SYNTHROID) 100 MCG tablet Take 1 tablet (100 mcg total) by mouth daily.   metoprolol succinate (TOPROL-XL) 25 MG 24 hr tablet TAKE 1 TABLET BY MOUTH EVERY DAY   omeprazole (PRILOSEC) 20 MG capsule TAKE 1 CAPSULE BY MOUTH EVERY DAY   Polyethyl Glycol-Propyl Glycol (SYSTANE OP) Place 1 drop into both eyes as needed.   potassium chloride SA (KLOR-CON M) 10 MEQ tablet Take 1 tablet (10 mEq total) by mouth daily as needed (leg swelling). Take with furosemide   rosuvastatin (CRESTOR) 10 MG tablet TAKE 1 TABLET BY MOUTH DAILY   Spacer/Aero-Holding Chambers (AEROCHAMBER MV) inhaler Use as instructed   tiZANidine (ZANAFLEX) 4 MG tablet TAKE HALF TO ONE TABLET BY MOUTH EVERY 6 HOURS AS NEEDED FOR MUSCLE SPASMS   valsartan (DIOVAN) 40 MG tablet TAKE 1 TABLET BY MOUTH EVERY DAY   vitamin C (ASCORBIC ACID) 250 MG tablet Take 500 mg by mouth daily.    Allergies: Patient is allergic to sulfonamide derivatives, alendronate sodium, dilaudid [hydromorphone hcl],  sulfa antibiotics, other, and wound dressing adhesive. Family History: Patient family history includes Alzheimer's disease in her mother; Arthritis in her brother; Breast cancer in her maternal aunt; Colon cancer in her mother; Colon polyps in her brother; Healthy in her son; Heart disease in her mother; Hypertension in her mother; Kidney failure in her mother; Suicidality in her brother. Social History:  Patient  reports that she quit smoking about 4 years ago. Her smoking use included cigarettes. She started smoking about 38 years ago. She has a 11.2 pack-year smoking history. She has been exposed to tobacco smoke. She has never used smokeless tobacco. She reports current alcohol use. She reports that she does not use drugs.  Review of Systems: Constitutional: Negative for fever malaise or anorexia Cardiovascular: negative for chest pain Respiratory: negative for SOB or  persistent cough Gastrointestinal: negative for abdominal pain  Objective  Vitals: BP 130/84   Pulse 75   Temp 97.6 F (36.4 C)   Ht 5' 6.5" (1.689 m)   Wt 173 lb (78.5 kg)   SpO2 95%   BMI 27.50 kg/m  General: no acute distress , A&Ox3 HEENT: PEERL, conjunctiva normal, neck is supple Cardiovascular:  RRR without murmur or gallop.  Respiratory:  Good breath sounds bilaterally, CTAB with normal respiratory effort Skin:  Warm, no rashes  Commons side effects, risks, benefits, and alternatives for medications and treatment plan prescribed today were discussed, and the patient expressed understanding of the given instructions. Patient is instructed to call or message via MyChart if he/she has any questions or concerns regarding our treatment plan. No barriers to understanding were identified. We discussed Red Flag symptoms and signs in detail. Patient expressed understanding regarding what to do in case of urgent or emergency type symptoms.  Medication list was reconciled, printed and provided to the patient in AVS. Patient  instructions and summary information was reviewed with the patient as documented in the AVS. This note was prepared with assistance of Dragon voice recognition software. Occasional wrong-word or sound-a-like substitutions may have occurred due to the inherent limitations of voice recognition software

## 2022-08-06 NOTE — Progress Notes (Signed)
See mychart note Dear Ms. April [name], I went back and reviewed your chart.  I believe you have mild chronic kidney disease and your current results today look very stable.  I suspect you are mildly dehydrated at your last visit.  I will monitor your kidney function for you.  I am glad you are feeling well Sincerely, Dr. Mardelle Matte

## 2022-08-10 NOTE — Therapy (Signed)
OUTPATIENT PHYSICAL THERAPY VESTIBULAR TREATMENT     Patient Name: April [name] MRN: [id number] DOB:October 03, 1955, 67 y.o., female Today's Date: 08/10/2022  END OF SESSION:         Past Medical History:  Diagnosis Date   Allergy    Anemia    Anxiety    Anxiety and depression    related to caring for mother during terminal illness   Arthritis    Asthma    AVN (avascular necrosis of bone) (HCC)    hip and wrist   Bronchitis    hx of   Cataract    Chronic kidney disease    COPD (chronic obstructive pulmonary disease) (HCC)    Depression    Emphysema of lung (HCC)    Endometriosis    FH: CAD (coronary artery disease)    GERD (gastroesophageal reflux disease)    ocassional   History of blood transfusion    after hip repackment - broke out in hives and started itching   History of palpitations    Hyperlipidemia    Hypertension    Hypothyroid    Neuromuscular disorder (HCC)    Non-ischemic cardiomyopathy (HCC) 2019   Osteopenia    forteo through Dr. Corliss Skains (started 10/12)   Osteoporosis    PMR (polymyalgia rheumatica) (HCC)    SIRS (systemic inflammatory response syndrome) (HCC) 10/2017   Smoker    SOB (shortness of breath) on exertion    Squamous cell carcinoma    facial, 2016   Temporal arteritis (HCC)    s/p prednisone taper   Past Surgical History:  Procedure Laterality Date   ABDOMINAL ADHESION SURGERY     ABDOMINAL HYSTERECTOMY     BREAST EXCISIONAL BIOPSY Left    BREAST REDUCTION SURGERY Bilateral 06/13/2019   Procedure: MAMMARY REDUCTION  (BREAST);  Surgeon: Allena Napoleon, MD;  Location: [facility] OR;  Service: Plastics;  Laterality: Bilateral;   BREAST SURGERY Left    benign bx 1990   CARDIAC CATHETERIZATION  2007   no PCI   CATARACT EXTRACTION Bilateral    CHOLECYSTECTOMY     COLONOSCOPY     COLONOSCOPY W/ POLYPECTOMY     INCONTINENCE SURGERY     2007    JOINT REPLACEMENT Left 2012   left hip   REDUCTION MAMMAPLASTY     2021   REVERSE  SHOULDER ARTHROPLASTY Left 05/28/2020   Procedure: REVERSE SHOULDER ARTHROPLASTY;  Surgeon: Yolonda Kida, MD;  Location: [facility] OR;  Service: Orthopedics;  Laterality: Left;  2.5 hrs   TONSILLECTOMY     TOTAL HIP ARTHROPLASTY Right 10/04/2012   Procedure: TOTAL HIP ARTHROPLASTY;  Surgeon: Valeria Batman, MD;  Location: [facility] OR;  Service: Orthopedics;  Laterality: Right;   TOTAL SHOULDER REPLACEMENT Left    WRIST SURGERY Left    2012/ left wrist/ bone removed due to necrosis   Patient Active Problem List   Diagnosis Date Noted   Bilateral lower extremity edema 05/27/2022   Frequent falls 03/05/2022   Serrated polyp of colon 03/05/2022   Mixed hyperlipidemia 12/03/2021   Unsteady gait 07/20/2021   Chronic diastolic CHF (congestive heart failure) (HCC) 06/13/2021   Upper airway cough syndrome 05/07/2021   Osteoporosis 03/23/2021   Allergic rhinitis 04/06/2019   Aortic atherosclerosis (HCC) 12/17/2017   Pulmonary nodule 12/17/2017   GERD (gastroesophageal reflux disease) 12/12/2017   COPD, severe (HCC) 11/24/2017   CKD (chronic kidney disease), stage III (HCC) 10/28/2017   Essential hypertension 06/23/2017   Polymyalgia rheumatica (HCC) 05/22/2016  History of bilateral hip replacements 05/22/2016   DDD (degenerative disc disease), lumbar 05/22/2016   History of avascular necrosis of capital femoral epiphysis 08/14/2014   Asthma 03/23/2010   Acquired hypothyroidism 03/21/2010   Chronic recurrent major depressive disorder (HCC) 03/21/2010   Former smoker 03/21/2010   History of temporal arteritis 03/21/2010    PCP: Willow Ora, MD REFERRING PROVIDER: Willow Ora, MD  REFERRING DIAG: R26.89 (ICD-10-CM) - Poor balance R29.6 (ICD-10-CM) - Frequent falls  THERAPY DIAG:  No diagnosis found.  ONSET DATE: 5 years ago to present  Rationale for Evaluation and Treatment: Rehabilitation  SUBJECTIVE:   SUBJECTIVE STATEMENT: "I'm fine." Reports the R shoulder has been  bothering me a little bit; admits "I have a tendency to lift things more than I should."  Pt accompanied by: self  PERTINENT HISTORY: history of pre-diabetes, B12 deficiency hypothyroidism, COPD (former smoker), HLD, HTN, osteoporosis, temporal arteritis, anxiety and depression  PAIN:  Are you having pain? Yes: NPRS scale: 0/10 Pain location: R shoulder Pain description: strain Aggravating factors: "I think I strained it earlier this week" Relieving factors: tylenol  PRECAUTIONS: Fall  WEIGHT BEARING RESTRICTIONS: No  FALLS: Has patient fallen in last 6 months? Yes. Number of falls 4  LIVING ENVIRONMENT: Lives with: lives alone Lives in: [place], [place] Stairs: Yes: External: flight steps; bilateral but cannot reach both Has following equipment at home: Single point cane and Walker - 4 wheeled  PLOF: Independent, Independent with basic ADLs, Independent with household mobility with device, Independent with community mobility with device, and pt reports she has a caregiver that comes 2-3x/wk to stand-by for showers and light housekeeping  PATIENT GOALS: improve balance, reduce risk for falls  OBJECTIVE:    TODAY'S TREATMENT: 08/11/22 Activity Comments                       HOME EXERCISE PROGRAM Access Code: [email or web address] URL: [email or web address] Date: 08/04/2022 Prepared by: [facility] - Outpatient  Rehab - [facility]  Program Notes perform standing exercises in corner for safety  Exercises - Seated Gaze Stabilization with Head Rotation  - 1 x daily - 7 x weekly - 3-5 sets - 15-30 sec hold - Romberg Stance with Head Rotation  - 1 x daily - 5 x weekly - 2-3 sets - 30 sec hold - Romberg Stance with Head Nods  - 1 x daily - 5 x weekly - 2-3 sets - 30 sec hold - Romberg Stance with Eyes Closed  - 1 x daily - 5 x weekly - 2 sets - 30 sec hold - Semi-Tandem Corner Balance With Eyes Open  - 1 x daily - 7 x weekly - 3 sets - 15-30 sec hold - Sit to  Stand  - 1 x daily - 5 x weekly - 2 sets - 10 reps - Backward Walking with Counter Support  - 1 x daily - 5 x weekly - 2 sets - 5 reps - Side Stepping with Counter Support  - 1 x daily - 5 x weekly - 2 sets - 10 reps - Standing Hip Abduction with Resistance at Ankles and Counter Support  - 1 x daily - 5 x weekly - 2 sets - 10 reps      Below measures were taken at time of initial evaluation unless otherwise specified:   DIAGNOSTIC FINDINGS:  IMPRESSION: 1. No acute fracture or destructive bone lesion. 2. Multilevel degenerative disc and joint changes as above. 3. Moderate  C5-6 and C6-7 central canal stenosis. 4. Multilevel neuroforaminal stenosis including moderate to severe left-greater-than-right C3-4, moderate to severe right and moderate left C4-5, severe right and moderate left C5-6, and severe right and mild-to-moderate left C6-7 neuroforaminal stenosis.  COGNITION: Overall cognitive status: Within functional limits for tasks assessed   SENSATION: WFL  EDEMA:  Yes, some swelling Left > right. Wearing compression stockings  MUSCLE TONE:  WNL  DTRs:  WNL  POSTURE:  forward head  Cervical ROM:    Active A/PROM (deg) eval  Flexion WNL  Extension 50  Right lateral flexion 25  Left lateral flexion 30  Right rotation 40  Left rotation 50  (Blank rows = not tested)  STRENGTH:   LOWER EXTREMITY MMT:   MMT Right eval Left eval  Hip flexion    Hip abduction    Hip adduction    Hip internal rotation    Hip external rotation    Knee flexion    Knee extension    Ankle dorsiflexion    Ankle plantarflexion    Ankle inversion    Ankle eversion    (Blank rows = not tested)  BED MOBILITY:  indep  TRANSFERS: Assistive device utilized: Single point cane and None  Sit to stand: Complete Independence Stand to sit: Complete Independence Chair to chair: Modified independence Floor:  NT    GAIT: Gait pattern: decreased stride length Distance walked:   Assistive device utilized: Single point cane Level of assistance: Modified independence Comments:     VESTIBULAR ASSESSMENT:  GENERAL OBSERVATION: wears progressive lenses   SYMPTOM BEHAVIOR:  Subjective history: 5 year hx of vertigo and general unsteadiness  Non-Vestibular symptoms: neck pain, headaches, tinnitus, and migraine symptoms  Type of dizziness: Imbalance (Disequilibrium), Unsteady with head/body turns, and "Swimmyheaded"  Frequency: varies  Duration: seconds, with lingering side effects x days  Aggravating factors: Induced by motion: turning body quickly and turning head quickly and Worse in the dark  Relieving factors: slow movements  Progression of symptoms: better  OCULOMOTOR EXAM:  Ocular Alignment: normal  Ocular ROM: No Limitations  Spontaneous Nystagmus: absent  Gaze-Induced Nystagmus: absent  Smooth Pursuits:  slow and off target  Saccades: hypometric/undershoots, extra eye movements, and slow  Convergence/Divergence:  very limited left eye convergence    VESTIBULAR - OCULAR REFLEX:   Slow VOR: Positive Bilaterally and Comment: saccadic intrusions horizontal > vertical  VOR Cancellation: Corrective Saccades  Head-Impulse Test: HIT Right: positive HIT Left: positive, left side with greater amplitude  Dynamic Visual Acuity: Not able to be assessed   POSITIONAL TESTING: Pt denies any positional dizziness at this time but notes with sit to stand she experiences lightheadedness.  Reports she has had orthostatics checked at several MD appointments and therapy visits without drop in BP.   MOTION SENSITIVITY:  Motion Sensitivity Quotient Intensity: 0 = none, 1 = Lightheaded, 2 = Mild, 3 = Moderate, 4 = Severe, 5 = Vomiting  Intensity  1. Sitting to supine   2. Supine to L side   3. Supine to R side   4. Supine to sitting   5. L Hallpike-Dix   6. Up from L    7. R Hallpike-Dix   8. Up from R    9. Sitting, head tipped to L knee   10. Head up from L  knee   11. Sitting, head tipped to R knee   12. Head up from R knee   13. Sitting head turns x5   14.Sitting head  nods x5   15. In stance, 180 turn to L    16. In stance, 180 turn to R     OTHOSTATICS: not done  FUNCTIONAL GAIT: 5 times sit to stand: 18 sec with pushing back of legs on chair Timed up and go (TUG): NT Functional gait assessment: NT  06/09/22- TUG 12.6 seconds, FGA 17  M-CTSIB  Condition 1: Firm Surface, EO 30 Sec, Mild Sway  Condition 2: Firm Surface, EC 30 Sec, Moderate Sway  Condition 3: Foam Surface, EO 30 Sec, Mild Sway  Condition 4: Foam Surface, EC 20 Sec, Moderate and Severe Sway     VESTIBULAR TREATMENT:                                                                                                   DATE:   06/04/22  Canalith Repositioning:  Comment: not indicated at this time Gaze Adaptation:  x1 Viewing Horizontal: Position: sitting, Time: 15-30 sec, and Reps: 2 Habituation:  Other: TBD Other:   PATIENT EDUCATION: Education details: assessment fidings, rationale of intervention, HEP initiation Person educated: Patient Education method: Explanation and Handouts Education comprehension: verbalized understanding and needs further education  HOME EXERCISE PROGRAM: Access Code: [email or web address] URL: [email or web address] Date: 06/04/2022 Prepared by: Shary Decamp  Exercises - Seated Gaze Stabilization with Head Rotation  - 1 x daily - 7 x weekly - 3-5 sets - 15-30 sec hold  GOALS: Goals reviewed with patient? Yes  SHORT TERM GOALS: Target date: 06/25/2022    The patient will be independent with HEP for gaze adaptation, habituation, balance, and general mobility. Baseline: met for current 07/14/22 Goal status: IN PROGRESS 07/14/22    LONG TERM GOALS: Target date: 08/11/2022    Demo improved balance and BLE strength per time 15 sec 5xSTS test Baseline: 18 sec w/ compensation; 17.29 sec 07/14/22 Goal status: IN PROGRESS 07/14/22  2.   Manifest low risk for falls and improved mobility per score 22/28 Functional Gait Assessment Baseline: 17/30 07/14/22 Goal status: IN PROGRESS 07/14/22  3.  Demo improved postural stability and control maintaining normal-mild sway condition 4 M-CTSIB to improve safety with ADL Baseline: mod-severe x 20 sec; moderate sway for 10 sec 07/14/22 Goal status: IN PROGRESS 07/14/22  4.  Demo lower risk for falls per time 15 sec TUG test to improve safety with mobility Baseline: TBD Goal status: MET 06/09/22    ASSESSMENT:  CLINICAL IMPRESSION: Patient arrived to session without new complaints. Worked on weighted STS transfers with patient demonstrating less frequent retropulsion than before. Lateral hip strengthening was tolerated well and updated into HEP to assist with widened BOS with activities. Patient demonstrated difficulty today maintaining wide BOS with balance activities; improved with use of visual targets. Balance activities today focused on turns, backwards walking, and stepping strategy. Patient requires continued cues and occasional min A for safety with balance tasks. No complaints upon leaving.   OBJECTIVE IMPAIRMENTS: Abnormal gait, decreased activity tolerance, decreased balance, decreased mobility, decreased ROM, decreased strength, dizziness, and postural dysfunction.   ACTIVITY LIMITATIONS: lifting, bending, standing, transfers, reach over head, and locomotion  level  PARTICIPATION LIMITATIONS: meal prep, cleaning, driving, shopping, and community activity  PERSONAL FACTORS: Age, Time since onset of injury/illness/exacerbation, and 1-2 comorbidities: PMH  are also affecting patient's functional outcome.   REHAB POTENTIAL: Good  CLINICAL DECISION MAKING: Evolving/moderate complexity  EVALUATION COMPLEXITY: Moderate   PLAN:  PT FREQUENCY: 1x/week  PT DURATION: 4 weeks  PLANNED INTERVENTIONS: Therapeutic exercises, Therapeutic activity, Neuromuscular re-education, Balance  training, Gait training, Patient/Family education, Self Care, Joint mobilization, Stair training, Vestibular training, Canalith repositioning, Orthotic/Fit training, DME instructions, Aquatic Therapy, Dry Needling, Electrical stimulation, Spinal mobilization, Taping, and Manual therapy  PLAN FOR NEXT SESSION: work on transfer form and safety to prevent retropulsion; balance activities with reminders on wider BOS with gait and standing; balance challenges like EC, narrow BOS, head movements    Anette Guarneri, PT, DPT 08/10/22 12:21 PM  [facility] [address], [address] [place], [place] [address] Phone # [phone] Fax # [phone]

## 2022-08-11 ENCOUNTER — Ambulatory Visit: Payer: Medicare Other | Attending: Family Medicine | Admitting: Physical Therapy

## 2022-08-11 ENCOUNTER — Encounter: Payer: Self-pay | Admitting: Physical Therapy

## 2022-08-11 DIAGNOSIS — R42 Dizziness and giddiness: Secondary | ICD-10-CM | POA: Diagnosis not present

## 2022-08-11 DIAGNOSIS — R2681 Unsteadiness on feet: Secondary | ICD-10-CM | POA: Insufficient documentation

## 2022-08-25 ENCOUNTER — Encounter (HOSPITAL_BASED_OUTPATIENT_CLINIC_OR_DEPARTMENT_OTHER): Payer: Self-pay | Admitting: Cardiology

## 2022-08-25 ENCOUNTER — Other Ambulatory Visit: Payer: Self-pay | Admitting: Family Medicine

## 2022-08-25 ENCOUNTER — Ambulatory Visit (HOSPITAL_BASED_OUTPATIENT_CLINIC_OR_DEPARTMENT_OTHER): Payer: Medicare Other | Admitting: Cardiology

## 2022-08-25 VITALS — BP 110/70 | HR 89 | Ht 66.5 in | Wt 176.1 lb

## 2022-08-25 DIAGNOSIS — I2510 Atherosclerotic heart disease of native coronary artery without angina pectoris: Secondary | ICD-10-CM | POA: Diagnosis not present

## 2022-08-25 DIAGNOSIS — Z712 Person consulting for explanation of examination or test findings: Secondary | ICD-10-CM | POA: Diagnosis not present

## 2022-08-25 DIAGNOSIS — R0609 Other forms of dyspnea: Secondary | ICD-10-CM

## 2022-08-25 DIAGNOSIS — R600 Localized edema: Secondary | ICD-10-CM | POA: Diagnosis not present

## 2022-08-25 DIAGNOSIS — I700 Atherosclerosis of aorta: Secondary | ICD-10-CM | POA: Diagnosis not present

## 2022-08-25 NOTE — Progress Notes (Signed)
Cardiology Office Note:  .    Date:  08/25/2022  ID:  April [name], DOB 01/30/1955, MRN [id number] PCP: Willow Ora, MD   [facility]Care Providers Cardiologist:  Jodelle Red, MD     History of Present Illness: .    April [name] is a 67 y.o. female with a hx of COPD gold stage 3, pulmonary nodules, PMR, temporal arteritis who is seen for follow up today. I initially met her 01/2019 as a new consult at the request of Joaquim Nam, MD for the evaluation and management of abnormal echo.   Cardiovascular risk factors: Chronic inflammatory conditions: PMR/temporal arteritis Tobacco use history: former Family history: mother had bypass surgery at age 1 (MI prior), kidney disease, colon cancer. Deceased aged 1. Father died of an accident at age 61, had COPD prior. MGF with stroke, died age 9. No other CVD that she knows of, possibly one other aunt/uncle with a stroke. Prior cardiac testing and/or incidental findings on other testing (ie coronary calcium): aortic calcifications seen on noncontrast ct. Cardiac cath 2006 by Dr. Allyson Sabal, no CAD. Stress test 09/08/2017 without ischemia. Echo 11/20 with reduced EF.   As of 11/2020 labs her LDL was 128, HDL was 60. There were no changes made to medication. Diet recommendations were made. She was seen by Cannon Kettle, PA-C on 05/20/21 for dyspnea on exertion, wheezing and cough. She was treated for COPD flare with prednisone. Ankle edema, wheezing and cough had improved although she still had concerns of dyspnea with mild exertion. She was started on HCTZ 12.5 mg daily and continued on aspirin 81 mg daily and Diovan 40 mg daily.    At her visit 08/2021, she reported low blood pressures at home as low as 90/54 (was 137/87 in clinic). Nurse at [facility] had measured her blood pressure at 100/60 the day prior. She felt dizzy but denied syncope. She noted her low blood pressures began since starting the HCTZ. Orthostatics were  negative in the office. We discontinued her HCTZ with plans to monitor for worsening edema. On 10/16/2021 she had presented to the ED after a fall from losing her balance when standing up; suffered bilateral skin tears of her right arm. Hints exam was performed and demonstrated leftward beats of nystagmus, corrective saccade on head impulse, no skew abnormality. She declined further work-up. She returned to the ED 01/15/2022 with left leg weakness. Imaging showed multilevel neuroforaminal stenosis C3-C7 with some moderate spinal canal impingement. EKG with normal sinus rhythm and no acute ischemia. Referral to neurosurgeon was recommended.  Today, she states that she isn't feeling right. Her feet have been swelling, worse depending on her activity. By mid-morning the swelling will start, most severe by night time. Has noticed that the skin of her legs appears shiny at times. Compression helps somewhat. Also she wonders if she is feeling abdominal fluid retention. She has been avoiding sodium in her diet in favor of salt substitutes. She confirms increased urine production when taking Lasix.  She complains of wheezing and shortness of breath with exertion. No wheezing while sitting. This morning she used her albuterol inhaler, with immediate improvement of her wheezing. She has also felt short of breath when waking up in the mornings. Previously sleeping on 2 pillows, more recently using 1 pillow. A couple nights she slept in the recliner which helped her to feel better.  A couple months ago she developed a COVID-19 infection. She felt terrible at the time. Continues to  experience lingering fatigue. She also believes her balance issues may be related to her COVID infection. Currently she walks with a cane for assistance.  She denies any palpitations, chest pain, lightheadedness, headaches, syncope, or PND.  ROS:  Please see the history of present illness. ROS otherwise negative except as noted.  (+) BLE  edema in feet (+) Exertional wheezing and shortness of breath (+) Orthopnea (+) Fatigue/Malaise (+) Easy bruising  Studies Reviewed: Marland Kitchen    EKG Interpretation Date/Time:  Tuesday August 25 2022 09:11:05 EDT Ventricular Rate:  89 PR Interval:  162 QRS Duration:  90 QT Interval:  382 QTC Calculation: 464 R Axis:   71  Text Interpretation: Normal sinus rhythm Normal ECG Confirmed by Jodelle Red [phone]) on 08/25/2022 9:27:28 AM     Physical Exam:    VS:  BP 110/70 (BP Location: Left Arm, Patient Position: Sitting, Cuff Size: Normal)   Pulse 89   Ht 5' 6.5" (1.689 m)   Wt 176 lb 1.6 oz (79.9 kg)   BMI 28.00 kg/m    Wt Readings from Last 3 Encounters:  08/25/22 176 lb 1.6 oz (79.9 kg)  08/06/22 173 lb (78.5 kg)  07/21/22 167 lb 11.2 oz (76.1 kg)    GEN: Well nourished, well developed in no acute distress HEENT: Normal, moist mucous membranes NECK: No JVD CARDIAC: regular rhythm, normal S1 and S2, no rubs or gallops. No murmur. VASCULAR: Radial and DP pulses 2+ bilaterally. No carotid bruits RESPIRATORY:  Distant but clear to auscultation without rales, wheezing or rhonchi  ABDOMEN: Soft, non-tender, non-distended MUSCULOSKELETAL:  Ambulates independently SKIN: Warm and dry, no pitting edema, minimal nonpitting edema bilaterally NEUROLOGIC:  Alert and oriented x 3. No focal neuro deficits noted. PSYCHIATRIC:  Normal affect   ASSESSMENT AND PLAN: .    Shortness of breath LE edema -no PND, orthopnea -no JVD on exam -reviewed prior echo -high suspicion that LE edema is venous insufficiency. Reviewed management today -reports that SOB better with albuterol. Suspect pulm etiology -if symptoms worsen, she will contact me   Nonobstructive CAD Aortic atherosclerosis Hypercholesterolemia -coronary CT: calcium score 71, nonobstructive CAD noted -discussed aspirin previously. She is tolerating rosuvastatin -LDL goal <70, last 67 -counseled on red flag warning signs  that need immediate medical attention   Abnormal echo, with prior reduced EF consistent with nonischemic cardiomyopathy, EF now normalized -NYHA class I-II.  -stress test negative for ischemia, remote cath normal, nonobstructive CAD on coronary CT -continue metoprolol succinate 25 mg daily, valsartan 40 mg daily. Limited by low blood pressure -clinically appears euvolemic   Family history of heart disease: no premature disease, but multiple members of the family with ASCVD   Cardiac risk counseling and prevention recommendations: -recommend heart healthy/Mediterranean diet, with whole grains, fruits, vegetable, fish, lean meats, nuts, and olive oil. Limit salt. -recommend moderate walking, 3-5 times/week for 30-50 minutes each session. Aim for at least 150 minutes.week. Goal should be pace of 3 miles/hours, or walking 1.5 miles in 30 minutes -recommend avoidance of tobacco products. Avoid excess alcohol.  Dispo: Follow-up in 6 months, or sooner as needed.  I,Mathew Stumpf,acting as a Neurosurgeon for [employer], MD.,have documented all relevant documentation on the behalf of Jodelle Red, MD,as directed by  Jodelle Red, MD while in the presence of Jodelle Red, MD.  I, Jodelle Red, MD, have reviewed all documentation for this visit. The documentation on 08/25/22 for the exam, diagnosis, procedures, and orders are all accurate and complete.   Signed, Arvil Utz  Cristal Deer, MD

## 2022-08-25 NOTE — Patient Instructions (Signed)

## 2022-08-27 ENCOUNTER — Ambulatory Visit: Payer: Medicare Other | Admitting: Rheumatology

## 2022-09-02 ENCOUNTER — Other Ambulatory Visit: Payer: Self-pay | Admitting: Family Medicine

## 2022-09-02 DIAGNOSIS — I428 Other cardiomyopathies: Secondary | ICD-10-CM
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
## 2022-09-16 ENCOUNTER — Other Ambulatory Visit: Payer: Self-pay | Admitting: Family Medicine

## 2022-09-16 ENCOUNTER — Encounter: Payer: Self-pay | Admitting: Pulmonary Disease

## 2022-09-16 ENCOUNTER — Ambulatory Visit: Payer: Medicare Other | Admitting: Pulmonary Disease

## 2022-09-16 VITALS — BP 120/80 | HR 82 | Ht 66.5 in | Wt 172.8 lb

## 2022-09-16 DIAGNOSIS — M62838 Other muscle spasm: Secondary | ICD-10-CM

## 2022-09-16 DIAGNOSIS — I5020 Unspecified systolic (congestive) heart failure: Secondary | ICD-10-CM

## 2022-09-16 DIAGNOSIS — J449 Chronic obstructive pulmonary disease, unspecified: Secondary | ICD-10-CM | POA: Diagnosis not present

## 2022-09-16 DIAGNOSIS — Z8616 Personal history of COVID-19: Secondary | ICD-10-CM

## 2022-09-16 DIAGNOSIS — M6289 Other specified disorders of muscle: Secondary | ICD-10-CM

## 2022-09-16 DIAGNOSIS — I5032 Chronic diastolic (congestive) heart failure: Secondary | ICD-10-CM | POA: Diagnosis not present

## 2022-09-16 MED ORDER — BREZTRI AEROSPHERE 160-9-4.8 MCG/ACT IN AERO: 2.0000 | INHALATION_SPRAY | Freq: Two times a day (BID) | RESPIRATORY_TRACT | 3 refills | Status: DC

## 2022-09-16 MED ORDER — AZITHROMYCIN 250 MG PO TABS: 250.0000 mg | ORAL_TABLET | ORAL | 3 refills | Status: DC

## 2022-09-16 NOTE — Patient Instructions (Signed)
Thank you for visiting Dr. Tonia Brooms at [facility]. Today we recommend the following:  Continue breztri and albuterol as needed  Continue azithromycin MWF   Return in about 6 months (around 03/16/2023) for with APP.    Please do your part to reduce the spread of COVID-19.
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
## 2022-09-21 ENCOUNTER — Ambulatory Visit: Payer: Medicare Other | Admitting: Physician Assistant

## 2022-09-23 ENCOUNTER — Encounter: Payer: Self-pay | Admitting: Physician Assistant

## 2022-09-23 ENCOUNTER — Ambulatory Visit: Payer: Medicare Other | Attending: Rheumatology | Admitting: Physician Assistant

## 2022-09-23 VITALS — BP 134/82 | HR 82 | Resp 14 | Ht 66.0 in | Wt 173.6 lb

## 2022-09-23 DIAGNOSIS — Z8781 Personal history of (healed) traumatic fracture: Secondary | ICD-10-CM | POA: Diagnosis not present

## 2022-09-23 DIAGNOSIS — M5136 Other intervertebral disc degeneration, lumbar region: Secondary | ICD-10-CM

## 2022-09-23 DIAGNOSIS — R768 Other specified abnormal immunological findings in serum: Secondary | ICD-10-CM

## 2022-09-23 DIAGNOSIS — Z96612 Presence of left artificial shoulder joint: Secondary | ICD-10-CM | POA: Diagnosis not present

## 2022-09-23 DIAGNOSIS — Z96643 Presence of artificial hip joint, bilateral: Secondary | ICD-10-CM

## 2022-09-23 DIAGNOSIS — Z87891 Personal history of nicotine dependence: Secondary | ICD-10-CM

## 2022-09-23 DIAGNOSIS — R296 Repeated falls: Secondary | ICD-10-CM

## 2022-09-23 DIAGNOSIS — Z5181 Encounter for therapeutic drug level monitoring: Secondary | ICD-10-CM | POA: Diagnosis not present

## 2022-09-23 DIAGNOSIS — M810 Age-related osteoporosis without current pathological fracture: Secondary | ICD-10-CM

## 2022-09-23 DIAGNOSIS — I10 Essential (primary) hypertension: Secondary | ICD-10-CM

## 2022-09-23 DIAGNOSIS — F32A Depression, unspecified: Secondary | ICD-10-CM

## 2022-09-23 DIAGNOSIS — F419 Anxiety disorder, unspecified: Secondary | ICD-10-CM

## 2022-09-23 DIAGNOSIS — R29898 Other symptoms and signs involving the musculoskeletal system: Secondary | ICD-10-CM | POA: Diagnosis not present

## 2022-09-23 DIAGNOSIS — Z8639 Personal history of other endocrine, nutritional and metabolic disease: Secondary | ICD-10-CM

## 2022-09-23 DIAGNOSIS — M316 Other giant cell arteritis: Secondary | ICD-10-CM | POA: Diagnosis not present

## 2022-09-23 DIAGNOSIS — M353 Polymyalgia rheumatica: Secondary | ICD-10-CM

## 2022-09-23 DIAGNOSIS — Z8709 Personal history of other diseases of the respiratory system: Secondary | ICD-10-CM

## 2022-09-23 DIAGNOSIS — R7689 Other specified abnormal immunological findings in serum: Secondary | ICD-10-CM

## 2022-09-23 DIAGNOSIS — M51369 Other intervertebral disc degeneration, lumbar region without mention of lumbar back pain or lower extremity pain: Secondary | ICD-10-CM

## 2022-09-24 ENCOUNTER — Ambulatory Visit: Payer: Medicare Other | Admitting: Physician Assistant

## 2022-09-24 DIAGNOSIS — Z87891 Personal history of nicotine dependence: Secondary | ICD-10-CM

## 2022-09-24 DIAGNOSIS — R296 Repeated falls: Secondary | ICD-10-CM

## 2022-09-24 DIAGNOSIS — M316 Other giant cell arteritis: Secondary | ICD-10-CM

## 2022-09-24 DIAGNOSIS — Z8709 Personal history of other diseases of the respiratory system: Secondary | ICD-10-CM

## 2022-09-24 DIAGNOSIS — Z5181 Encounter for therapeutic drug level monitoring: Secondary | ICD-10-CM

## 2022-09-24 DIAGNOSIS — Z96643 Presence of artificial hip joint, bilateral: Secondary | ICD-10-CM

## 2022-09-24 DIAGNOSIS — I10 Essential (primary) hypertension: Secondary | ICD-10-CM

## 2022-09-24 DIAGNOSIS — M353 Polymyalgia rheumatica: Secondary | ICD-10-CM

## 2022-09-24 DIAGNOSIS — Z96612 Presence of left artificial shoulder joint: Secondary | ICD-10-CM

## 2022-09-24 DIAGNOSIS — F419 Anxiety disorder, unspecified: Secondary | ICD-10-CM

## 2022-09-24 DIAGNOSIS — R29898 Other symptoms and signs involving the musculoskeletal system: Secondary | ICD-10-CM

## 2022-09-24 DIAGNOSIS — Z8639 Personal history of other endocrine, nutritional and metabolic disease: Secondary | ICD-10-CM

## 2022-09-24 DIAGNOSIS — R768 Other specified abnormal immunological findings in serum: Secondary | ICD-10-CM

## 2022-09-24 DIAGNOSIS — Z8781 Personal history of (healed) traumatic fracture: Secondary | ICD-10-CM

## 2022-09-24 DIAGNOSIS — M5136 Other intervertebral disc degeneration, lumbar region: Secondary | ICD-10-CM

## 2022-09-24 DIAGNOSIS — M810 Age-related osteoporosis without current pathological fracture: Secondary | ICD-10-CM

## 2022-10-05 ENCOUNTER — Encounter: Payer: Self-pay | Admitting: Family Medicine

## 2022-10-05 ENCOUNTER — Ambulatory Visit (INDEPENDENT_AMBULATORY_CARE_PROVIDER_SITE_OTHER): Payer: Medicare Other | Admitting: Family Medicine

## 2022-10-05 VITALS — BP 132/78 | HR 89 | Temp 98.1°F | Ht 66.0 in | Wt 172.0 lb

## 2022-10-05 DIAGNOSIS — R2681 Unsteadiness on feet: Secondary | ICD-10-CM | POA: Diagnosis not present

## 2022-10-05 DIAGNOSIS — S51812A Laceration without foreign body of left forearm, initial encounter: Secondary | ICD-10-CM | POA: Diagnosis not present

## 2022-10-05 DIAGNOSIS — Z23 Encounter for immunization: Secondary | ICD-10-CM

## 2022-10-05 NOTE — Progress Notes (Signed)
Subjective  CC:  Chief Complaint  Patient presents with   Referral    Referral to ENT bc here balance is off. Pt has a skin tear on her Lt wrist    HPI: April [name] is a 67 y.o. female who presents to the office today to address the problems listed above in the chief complaint. Vertigo/off balance: ongoing problem evaluate by neuro w/o clear etiology. Has improved overall but still with intermittent sxs. Would like ENT input: she requests Dr. Jenne Pane.  Had a fall and small skin tear on left forearm. Healing.  Flu shot today.    Assessment  1. Unsteady gait   2. Need for influenza vaccination   3. Skin tear of left forearm without complication, initial encounter      Plan  Unsteady gait/vertigo:  see neuro thorough eval; refer to Ent for further eval.  Skin tear: routine wound care.  Uses cane Get up slowly.  Flu shot today  Follow up: as scheduled. 12/08/2022  Orders Placed This Encounter  Procedures   Flu Vaccine Trivalent High Dose (Fluad)   Ambulatory referral to ENT   No orders of the defined types were placed in this encounter.     I reviewed the patients updated PMH, FH, and SocHx.    Patient Active Problem List   Diagnosis Date Noted   Mixed hyperlipidemia 12/03/2021    Priority: High   Chronic diastolic CHF (congestive heart failure) (HCC) 06/13/2021    Priority: High   COPD, severe (HCC) 11/24/2017    Priority: High   CKD (chronic kidney disease), stage III (HCC) 10/28/2017    Priority: High   Essential hypertension 06/23/2017    Priority: High   Polymyalgia rheumatica (HCC) 05/22/2016    Priority: High   Asthma 03/23/2010    Priority: High   Acquired hypothyroidism 03/21/2010    Priority: High   Chronic recurrent major depressive disorder (HCC) 03/21/2010    Priority: High   Upper airway cough syndrome 05/07/2021    Priority: Medium    Osteoporosis 03/23/2021    Priority: Medium    Aortic atherosclerosis (HCC) 12/17/2017    Priority:  Medium    Pulmonary nodule 12/17/2017    Priority: Medium    GERD (gastroesophageal reflux disease) 12/12/2017    Priority: Medium    History of bilateral hip replacements 05/22/2016    Priority: Medium    DDD (degenerative disc disease), lumbar 05/22/2016    Priority: Medium    Former smoker 03/21/2010    Priority: Medium    History of temporal arteritis 03/21/2010    Priority: Medium    Allergic rhinitis 04/06/2019    Priority: Low   History of avascular necrosis of capital femoral epiphysis 08/14/2014    Priority: Low   Bilateral lower extremity edema 05/27/2022   Frequent falls 03/05/2022   Serrated polyp of colon 03/05/2022   Unsteady gait 07/20/2021   Current Meds  Medication Sig   acetaminophen (TYLENOL) 500 MG tablet Take 1,000 mg by mouth every 6 (six) hours as needed for moderate pain or headache.   albuterol (VENTOLIN HFA) 108 (90 Base) MCG/ACT inhaler Inhale 2 puffs into the lungs every 6 (six) hours as needed for wheezing or shortness of breath. Okay to dispense proair/Ventolin/albuterol.   ALPRAZolam (XANAX) 0.5 MG tablet TAKE 1/2 TO 1 TABLET BY MOUTH TWICE DAILY AS NEEDED FOR ANXIETY   azithromycin (ZITHROMAX) 250 MG tablet Take 1 tablet (250 mg total) by mouth every Monday, Wednesday, and  Friday.   Budeson-Glycopyrrol-Formoterol (BREZTRI AEROSPHERE) 160-9-4.8 MCG/ACT AERO Inhale 2 puffs into the lungs every 12 (twelve) hours.   buPROPion (WELLBUTRIN XL) 300 MG 24 hr tablet TAKE 1 TABLET BY MOUTH EVERY DAY   Calcium 500 MG CHEW Chew 500 mg by mouth 2 (two) times daily.   Cholecalciferol 250 MCG (10000 UT) CAPS Take by mouth.   Cyanocobalamin (VITAMIN B 12 PO) Take by mouth.   furosemide (LASIX) 20 MG tablet Take 1 tablet (20 mg total) by mouth daily as needed for edema. Take with potassium   ibandronate (BONIVA) 150 MG tablet TAKE 1 TABLET BY MOUTH EACH MONTH WITH 8oz OF plain WATER 60 mins BEFORE first food, drink, OR meds. stay upright FOR 60 MINUTES.    levothyroxine (SYNTHROID) 100 MCG tablet Take 1 tablet (100 mcg total) by mouth daily.   metoprolol succinate (TOPROL-XL) 25 MG 24 hr tablet TAKE 1 TABLET BY MOUTH EVERY DAY   omeprazole (PRILOSEC) 20 MG capsule TAKE 1 CAPSULE BY MOUTH EVERY DAY   Polyethyl Glycol-Propyl Glycol (SYSTANE OP) Place 1 drop into both eyes as needed.   potassium chloride SA (KLOR-CON M) 10 MEQ tablet Take 1 tablet (10 mEq total) by mouth daily as needed (leg swelling). Take with furosemide   rosuvastatin (CRESTOR) 10 MG tablet TAKE 1 TABLET BY MOUTH DAILY   Spacer/Aero-Holding Chambers (AEROCHAMBER MV) inhaler Use as instructed   tiZANidine (ZANAFLEX) 4 MG tablet TAKE 1/2 TO 1 TABLET BY MOUTH EVERY 6 HOURS AS NEEDED FOR MUSCLE SPASMS   valsartan (DIOVAN) 40 MG tablet TAKE 1 TABLET BY MOUTH EVERY DAY   vitamin C (ASCORBIC ACID) 250 MG tablet Take 500 mg by mouth daily.    Allergies: Patient is allergic to sulfonamide derivatives, alendronate sodium, dilaudid [hydromorphone hcl], sulfa antibiotics, other, and wound dressing adhesive. Family History: Patient family history includes Alzheimer's disease in her mother; Arthritis in her brother; Breast cancer in her maternal aunt; Colon cancer in her mother; Colon polyps in her brother; Healthy in her son; Heart disease in her mother; Hypertension in her mother; Kidney failure in her mother; Suicidality in her brother. Social History:  Patient  reports that she quit smoking about 4 years ago. Her smoking use included cigarettes. She started smoking about 38 years ago. She has a 11.2 pack-year smoking history. She has been exposed to tobacco smoke. She has never used smokeless tobacco. She reports current alcohol use. She reports that she does not use drugs.  Review of Systems: Constitutional: Negative for fever malaise or anorexia Cardiovascular: negative for chest pain Respiratory: negative for SOB or persistent cough Gastrointestinal: negative for abdominal  pain  Objective  Vitals: BP 132/78 Comment: by recent home readings  Pulse 89   Temp 98.1 F (36.7 C)   Ht 5\' 6"  (1.676 m)   Wt 172 lb (78 kg)   SpO2 96%   BMI 27.76 kg/m  General: no acute distress , A&Ox3 Left forearm: approx 3cm skin tear, healing granulation tissue w/out drainage or erythema.   Commons side effects, risks, benefits, and alternatives for medications and treatment plan prescribed today were discussed, and the patient expressed understanding of the given instructions. Patient is instructed to call or message via MyChart if he/she has any questions or concerns regarding our treatment plan. No barriers to understanding were identified. We discussed Red Flag symptoms and signs in detail. Patient expressed understanding regarding what to do in case of urgent or emergency type symptoms.  Medication list was reconciled, printed  and provided to the patient in AVS. Patient instructions and summary information was reviewed with the patient as documented in the AVS. This note was prepared with assistance of Dragon voice recognition software. Occasional wrong-word or sound-a-like substitutions may have occurred due to the inherent limitations of voice recognition software

## 2022-10-07 ENCOUNTER — Other Ambulatory Visit: Payer: Self-pay | Admitting: Family Medicine

## 2022-11-05 ENCOUNTER — Encounter (HOSPITAL_BASED_OUTPATIENT_CLINIC_OR_DEPARTMENT_OTHER): Payer: Self-pay

## 2022-11-26 DIAGNOSIS — R2689 Other abnormalities of gait and mobility: Secondary | ICD-10-CM | POA: Diagnosis not present

## 2022-11-26 DIAGNOSIS — R42 Dizziness and giddiness: Secondary | ICD-10-CM | POA: Diagnosis not present

## 2022-11-27 ENCOUNTER — Other Ambulatory Visit: Payer: Self-pay | Admitting: Family

## 2022-11-27 ENCOUNTER — Other Ambulatory Visit (HOSPITAL_BASED_OUTPATIENT_CLINIC_OR_DEPARTMENT_OTHER): Payer: Self-pay | Admitting: Cardiology

## 2022-11-27 DIAGNOSIS — E7800 Pure hypercholesterolemia, unspecified: Secondary | ICD-10-CM

## 2022-11-27 DIAGNOSIS — I2510 Atherosclerotic heart disease of native coronary artery without angina pectoris: Secondary | ICD-10-CM

## 2022-12-01 ENCOUNTER — Ambulatory Visit: Payer: Medicare Other | Admitting: Family Medicine

## 2022-12-01 ENCOUNTER — Ambulatory Visit (INDEPENDENT_AMBULATORY_CARE_PROVIDER_SITE_OTHER): Payer: Medicare Other

## 2022-12-01 VITALS — Wt 172.0 lb

## 2022-12-01 DIAGNOSIS — Z0000 Encounter for general adult medical examination without abnormal findings: Secondary | ICD-10-CM
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
## 2022-12-01 NOTE — Patient Instructions (Signed)
April [name] , Thank you for taking time to come for your Medicare Wellness Visit. I appreciate your ongoing commitment to your health goals. Please review the following plan we discussed and let me know if I can assist you in the future.   Referrals/Orders/Follow-Ups/Clinician Recommendations: Aim for 30 minutes of exercise or brisk walking, 6-8 glasses of water, and 5 servings of fruits and vegetables each day. Work on walking without cane   This is a list of the screening recommended for you and due dates:  Health Maintenance  Topic Date Due   DEXA scan (bone density measurement)  02/26/2023   Medicare Annual Wellness Visit  03/05/2023   Mammogram  06/23/2023   Colon Cancer Screening  06/10/2024   Pneumonia Vaccine  Completed   Flu Shot  Completed   COVID-19 Vaccine  Completed   Hepatitis C Screening  Completed   Zoster (Shingles) Vaccine  Completed   HPV Vaccine  Aged Out   DTaP/Tdap/Td vaccine  Discontinued    Advanced directives: (In Chart) A copy of your advanced directives are scanned into your chart should your provider ever need it.  Next Medicare Annual Wellness Visit scheduled for next year: Yes

## 2022-12-02 ENCOUNTER — Other Ambulatory Visit: Payer: Self-pay | Admitting: Family Medicine

## 2022-12-08 ENCOUNTER — Ambulatory Visit (INDEPENDENT_AMBULATORY_CARE_PROVIDER_SITE_OTHER): Payer: Medicare Other | Admitting: Family Medicine

## 2022-12-08 ENCOUNTER — Encounter: Payer: Self-pay | Admitting: Family Medicine

## 2022-12-08 VITALS — BP 152/84 | HR 81 | Temp 97.7°F | Ht 66.0 in | Wt 173.6 lb

## 2022-12-08 DIAGNOSIS — R42 Dizziness and giddiness: Secondary | ICD-10-CM

## 2022-12-08 DIAGNOSIS — Z0001 Encounter for general adult medical examination with abnormal findings: Secondary | ICD-10-CM

## 2022-12-08 DIAGNOSIS — N1831 Chronic kidney disease, stage 3a: Secondary | ICD-10-CM

## 2022-12-08 DIAGNOSIS — I5032 Chronic diastolic (congestive) heart failure: Secondary | ICD-10-CM

## 2022-12-08 DIAGNOSIS — M810 Age-related osteoporosis without current pathological fracture: Secondary | ICD-10-CM

## 2022-12-08 DIAGNOSIS — I10 Essential (primary) hypertension: Secondary | ICD-10-CM | POA: Diagnosis not present

## 2022-12-08 DIAGNOSIS — E039 Hypothyroidism, unspecified: Secondary | ICD-10-CM

## 2022-12-08 DIAGNOSIS — J449 Chronic obstructive pulmonary disease, unspecified: Secondary | ICD-10-CM | POA: Diagnosis not present

## 2022-12-08 DIAGNOSIS — E782 Mixed hyperlipidemia: Secondary | ICD-10-CM | POA: Diagnosis not present

## 2022-12-08 LAB — LIPID PANEL
Cholesterol: 140 mg/dL (ref 0–200)
HDL: 60.9 mg/dL (ref >39.00)
LDL Cholesterol: 71 mg/dL (ref 0–99)
NonHDL: 78.77
Total CHOL/HDL Ratio: 2
Triglycerides: 39 mg/dL (ref 0.0–149.0)
VLDL: 7.8 mg/dL (ref 0.0–40.0)

## 2022-12-08 LAB — COMPREHENSIVE METABOLIC PANEL
ALT: 13 U/L (ref 0–35)
AST: 16 U/L (ref 0–37)
Albumin: 4.2 g/dL (ref 3.5–5.2)
Alkaline Phosphatase: 63 U/L (ref 39–117)
BUN: 21 mg/dL (ref 6–23)
CO2: 28 meq/L (ref 19–32)
Calcium: 9.2 mg/dL (ref 8.4–10.5)
Chloride: 106 meq/L (ref 96–112)
Creatinine, Ser: 1.18 mg/dL (ref 0.40–1.20)
GFR: 47.05 mL/min — ABNORMAL LOW (ref >60.00)
Glucose, Bld: 119 mg/dL — ABNORMAL HIGH (ref 70–99)
Potassium: 3.9 meq/L (ref 3.5–5.1)
Sodium: 139 meq/L (ref 135–145)
Total Bilirubin: 0.5 mg/dL (ref 0.2–1.2)
Total Protein: 7 g/dL (ref 6.0–8.3)

## 2022-12-08 LAB — CBC WITH DIFFERENTIAL/PLATELET
Basophils Absolute: 0.1 10*3/uL (ref 0.0–0.1)
Basophils Relative: 0.8 % (ref 0.0–3.0)
Eosinophils Absolute: 0.1 10*3/uL (ref 0.0–0.7)
Eosinophils Relative: 0.9 % (ref 0.0–5.0)
HCT: 38.5 % (ref 36.0–46.0)
Hemoglobin: 12.6 g/dL (ref 12.0–15.0)
Lymphocytes Relative: 11.7 % — ABNORMAL LOW (ref 12.0–46.0)
Lymphs Abs: 0.8 10*3/uL (ref 0.7–4.0)
MCHC: 32.8 g/dL (ref 30.0–36.0)
MCV: 94.7 fL (ref 78.0–100.0)
Monocytes Absolute: 0.4 10*3/uL (ref 0.1–1.0)
Monocytes Relative: 5.7 % (ref 3.0–12.0)
Neutro Abs: 5.7 10*3/uL (ref 1.4–7.7)
Neutrophils Relative %: 80.9 % — ABNORMAL HIGH (ref 43.0–77.0)
Platelets: 179 10*3/uL (ref 150.0–400.0)
RBC: 4.06 Mil/uL (ref 3.87–5.11)
RDW: 14 % (ref 11.5–15.5)
WBC: 7 10*3/uL (ref 4.0–10.5)

## 2022-12-08 LAB — VITAMIN D 25 HYDROXY (VIT D DEFICIENCY, FRACTURES): VITD: 86.43 ng/mL (ref 30.00–100.00)

## 2022-12-08 LAB — VITAMIN B12: Vitamin B-12: 1209 pg/mL — ABNORMAL HIGH (ref 211–911)

## 2022-12-08 LAB — TSH: TSH: 1.45 u[IU]/mL (ref 0.35–5.50)

## 2022-12-08 MED ORDER — DIAZEPAM 5 MG PO TABS: 5.0000 mg | ORAL_TABLET | Freq: Every day | ORAL | 2 refills | Status: DC | PRN
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
## 2022-12-14 ENCOUNTER — Inpatient Hospital Stay
Admission: RE | Admit: 2022-12-14 | Discharge: 2022-12-14 | Disposition: A | Discharge status: Home / Self Care | Payer: Medicare Other | Source: Ambulatory Visit | Attending: Family Medicine | Admitting: Family Medicine

## 2022-12-14 DIAGNOSIS — Z87891 Personal history of nicotine dependence: Secondary | ICD-10-CM

## 2022-12-14 DIAGNOSIS — Z122 Encounter for screening for malignant neoplasm of respiratory organs: Secondary | ICD-10-CM

## 2022-12-15 NOTE — Progress Notes (Signed)
See my chart note.

## 2022-12-17 ENCOUNTER — Encounter: Payer: Self-pay | Admitting: Neurology

## 2022-12-17 ENCOUNTER — Ambulatory Visit: Payer: Medicare Other | Admitting: Neurology

## 2022-12-17 VITALS — BP 113/80 | HR 99 | Ht 66.0 in | Wt 173.0 lb

## 2022-12-17 DIAGNOSIS — R269 Unspecified abnormalities of gait and mobility: Secondary | ICD-10-CM

## 2022-12-17 DIAGNOSIS — R296 Repeated falls: Secondary | ICD-10-CM

## 2022-12-17 DIAGNOSIS — R2681 Unsteadiness on feet: Secondary | ICD-10-CM

## 2022-12-17 DIAGNOSIS — M6289 Other specified disorders of muscle: Secondary | ICD-10-CM | POA: Diagnosis not present

## 2022-12-17 MED ORDER — CARBIDOPA-LEVODOPA 25-100 MG PO TABS: 0.5000 | ORAL_TABLET | Freq: Three times a day (TID) | ORAL | 2 refills | Status: DC

## 2022-12-17 NOTE — Patient Instructions (Signed)
I saw you today for imbalance and falls. I'm not convinced, but this could be a type of parkinsonism. We discussed this today and agreed to trial a medication that treats parkinsonism to see if this helps. I am not diagnosing you with Parkinson's disease or parkinsonism today. If you respond well to the medication, this would indicate we are on the right track.  You will take Sinemet 1/2 tablet, three times a day. This is a very low dose, but we can see if it helps. We can always go up if it helps a little or we aren't sure.  I will see you back in 1 month.  The physicians and staff at [facility] are committed to providing excellent care. You may receive a survey requesting feedback about your experience at our office. We strive to receive "very good" responses to the survey questions. If you feel that your experience would prevent you from giving the office a "very good " response, please contact our office to try to remedy the situation. We may be reached at [phone]. Thank you for taking the time out of your busy day to complete the survey.  Jacquelyne Balint, MD [facility]  Preventing Falls at [facility] are common, often dreaded events in the lives of older people. Aside from the obvious injuries and even death that may result, fall can cause wide-ranging consequences including loss of independence, mental decline, decreased activity and mobility. Younger people are also at risk of falling, especially those with chronic illnesses and fatigue.  Ways to reduce risk for falling Examine diet and medications. Warm foods and alcohol dilate blood vessels, which can lead to dizziness when standing. Sleep aids, antidepressants and pain medications can also increase the likelihood of a fall.  Get a vision exam. Poor vision, cataracts and glaucoma increase the chances of falling.  Check foot gear. Shoes should fit snugly and have a sturdy, nonskid sole and a broad, low heel  Participate  in a physician-approved exercise program to build and maintain muscle strength and improve balance and coordination. Programs that use ankle weights or stretch bands are excellent for muscle-strengthening. Water aerobics programs and low-impact Tai Chi programs have also been shown to improve balance and coordination.  Increase vitamin D intake. Vitamin D improves muscle strength and increases the amount of calcium the body is able to absorb and deposit in bones.  How to prevent falls from common hazards Floors - Remove all loose wires, cords, and throw rugs. Minimize clutter. Make sure rugs are anchored and smooth. Keep furniture in its usual place.  Chairs -- Use chairs with straight backs, armrests and firm seats. Add firm cushions to existing pieces to add height.  Bathroom - Install grab bars and non-skid tape in the tub or shower. Use a bathtub transfer bench or a shower chair with a back support Use an elevated toilet seat and/or safety rails to assist standing from a low surface. Do not use towel racks or bathroom tissue holders to help you stand.  Lighting - Make sure halls, stairways, and entrances are well-lit. Install a night light in your bathroom or hallway. Make sure there is a light switch at the top and bottom of the staircase. Turn lights on if you get up in the middle of the night. Make sure lamps or light switches are within reach of the bed if you have to get up during the night.  Kitchen - Install non-skid rubber mats near the sink and stove.  Clean spills immediately. Store frequently used utensils, pots, pans between waist and eye level. This helps prevent reaching and bending. Sit when getting things out of lower cupboards.  Living room/ Bedrooms - Place furniture with wide spaces in between, giving enough room to move around. Establish a route through the living room that gives you something to hold onto as you walk.  Stairs - Make sure treads, rails, and rugs are secure.  Install a rail on both sides of the stairs. If stairs are a threat, it might be helpful to arrange most of your activities on the lower level to reduce the number of times you must climb the stairs.  Entrances and doorways - Install metal handles on the walls adjacent to the doorknobs of all doors to make it more secure as you travel through the doorway.  Tips for maintaining balance Keep at least one hand free at all times. Try using a backpack or fanny pack to hold things rather than carrying them in your hands. Never carry objects in both hands when walking as this interferes with keeping your balance.  Attempt to swing both arms from front to back while walking. This might require a conscious effort if Parkinson's disease has diminished your movement. It will, however, help you to maintain balance and posture, and reduce fatigue.  Consciously lift your feet off of the ground when walking. Shuffling and dragging of the feet is a common culprit in losing your balance.  When trying to navigate turns, use a "U" technique of facing forward and making a wide turn, rather than pivoting sharply.  Try to stand with your feet shoulder-length apart. When your feet are close together for any length of time, you increase your risk of losing your balance and falling.  Do one thing at a time. Don't try to walk and accomplish another task, such as reading or looking around. The decrease in your automatic reflexes complicates motor function, so the less distraction, the better.  Do not wear rubber or gripping soled shoes, they might "catch" on the floor and cause tripping.  Move slowly when changing positions. Use deliberate, concentrated movements and, if needed, use a grab bar or walking aid. Count 15 seconds between each movement. For example, when rising from a seated position, wait 15 seconds after standing to begin walking.  If balance is a continuous problem, you might want to consider a walking aid  such as a cane, walking stick, or walker. Once you've mastered walking with help, you might be ready to try it on your own again.

## 2022-12-23 ENCOUNTER — Encounter: Payer: Self-pay | Admitting: Pulmonary Disease

## 2022-12-25 DIAGNOSIS — R42 Dizziness and giddiness: Secondary | ICD-10-CM | POA: Diagnosis not present

## 2022-12-28 ENCOUNTER — Other Ambulatory Visit: Payer: Self-pay

## 2022-12-28 DIAGNOSIS — Z87891 Personal history of nicotine dependence: Secondary | ICD-10-CM

## 2022-12-28 DIAGNOSIS — Z122 Encounter for screening for malignant neoplasm of respiratory organs: Secondary | ICD-10-CM

## 2023-01-05 ENCOUNTER — Telehealth: Payer: Self-pay | Admitting: Neurology

## 2023-01-05 NOTE — Telephone Encounter (Signed)
Pt called in wanting to see if Dr. Loleta Chance could write a letter to her apartment complex. She needs a higher level of care than her apartment complex can provide. She needs to go to a facility and not live alone. She is trying to get out of her lease.

## 2023-01-14 ENCOUNTER — Other Ambulatory Visit: Payer: Self-pay | Admitting: Pulmonary Disease

## 2023-01-15 ENCOUNTER — Telehealth: Payer: Self-pay | Admitting: Pulmonary Disease

## 2023-01-15 ENCOUNTER — Other Ambulatory Visit (HOSPITAL_COMMUNITY): Payer: Self-pay

## 2023-01-15 NOTE — Telephone Encounter (Signed)
 Pharm calling and states PT can not afford Breztri . The pharm sent a req over for an alternative ( Symbicort ) but state it was denied. Pharm says 2 doses of inhaler left and pt is freaking out  Please call to advise. Can we leave samples for her until resolved?   Laural Gower is [phone]

## 2023-01-15 NOTE — Telephone Encounter (Signed)
 Triage, please see if Dr. Dewayne Hatch to RX any other inhalers since these had high deductibles. Call PT to advise.

## 2023-01-15 NOTE — Telephone Encounter (Signed)
 Processed test claims below and it shows that the patient has a deductible to meet. Co-pay will be high until this is met.   Breztri  - $302.00 Symbicort  - Brand $235.92  Generic non-formulary Breyna  - Non-formulary  Are there any specific inhalers you would like tested?

## 2023-01-16 NOTE — Telephone Encounter (Signed)
 Routing to Dr. Tonia Brooms so he can be able to review and advise on recommendations.

## 2023-01-18 ENCOUNTER — Telehealth: Payer: Self-pay | Admitting: Neurology

## 2023-01-18 NOTE — Telephone Encounter (Signed)
 Called pt and informed her voice mail of Dr. Loleta Chance answer. Told her to call if concerns or questions.

## 2023-01-18 NOTE — Telephone Encounter (Signed)
 Left a VM stating that she needs to give a update on the medication  please call

## 2023-01-19 NOTE — Telephone Encounter (Signed)
 [facility] is calling for an update on medication switch. Please call and advise. [phone]

## 2023-01-20 ENCOUNTER — Other Ambulatory Visit: Payer: Self-pay | Admitting: Neurology

## 2023-01-20 ENCOUNTER — Encounter: Payer: Self-pay | Admitting: Pulmonary Disease

## 2023-01-20 ENCOUNTER — Other Ambulatory Visit: Payer: Self-pay | Admitting: Family Medicine

## 2023-01-20 DIAGNOSIS — R2681 Unsteadiness on feet: Secondary | ICD-10-CM

## 2023-01-20 DIAGNOSIS — R269 Unspecified abnormalities of gait and mobility: Secondary | ICD-10-CM

## 2023-01-20 DIAGNOSIS — R296 Repeated falls: Secondary | ICD-10-CM

## 2023-01-20 DIAGNOSIS — M6289 Other specified disorders of muscle: Secondary | ICD-10-CM

## 2023-01-21 ENCOUNTER — Telehealth: Payer: Self-pay

## 2023-01-21 ENCOUNTER — Other Ambulatory Visit: Payer: Self-pay

## 2023-01-21 DIAGNOSIS — M810 Age-related osteoporosis without current pathological fracture: Secondary | ICD-10-CM

## 2023-01-21 NOTE — Telephone Encounter (Signed)
Copied from CRM [id number]. Topic: Clinical - Medical Advice >> Jan 20, 2023  4:20 PM Adaysia C wrote: Reason for CRM: Patient has requested for the provider to order her a bone density scan because she needs one done every 2 years. Please follow up with patient [phone]

## 2023-01-21 NOTE — Telephone Encounter (Signed)
Copied from CRM [id number]. Topic: Clinical - Medication Question >> Jan 20, 2023  4:23 PM Adaysia C wrote: Reason for CRM: Patient has requested prescription for ibandronate (BONIVA) 150 MG tablets because she gets them refilled every month but there are no reoccurring refills set for this medication. Please follow up with patient [phone]  This was prescribed by another provider. Ok to fill

## 2023-01-22 NOTE — Progress Notes (Signed)
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 was borderline low, I am not sure this is enough to explain symptoms. She did improve with B12 and PT, but has started to regress after stopping PT. While it is not definite, there are subtle signs of bradykinesia. Given her imbalance and these findings, parkinsonism is a possible etiology. She thinks she has had a little improvement with Sinemet 1/2 tab TID but it wears off, and she is still falling.  Plan: -Increase Sinemet to 1 tablet TID -Discussed DaTscan and will consider if she does not respond to Sinemet -Fall precautions discussed -Continue B12 1000 mcg daily  Return to clinic in 3 months  Total time spent reviewing records, interview, history/exam, documentation, and coordination of care on day of encounter:  40 min  Jacquelyne Balint, MD

## 2023-01-28 NOTE — Telephone Encounter (Signed)
I have received the following message from patient   "Hi Dr. Clent Ridges,   I have an appointment with you in March. I just found  out today Dr Tonia Brooms is leaving he has been looking for a substitute med that worrkslikeBreztri but will not cost $300..plus.  I don't have money to cover that..  your office brought me 2 sample packs of Breztri but they will be gone before month end .  Would you please follow-up on this [place] call [phone] to get this medicine.  Thank you  April [name] [birth year]"  Beth can you please advise. Would you like for pt to be provided with more samples until we can figure this out ?   "

## 2023-01-29 NOTE — Telephone Encounter (Signed)
Patient can apply for patient assistance and we can give her another 2 samples in the mean time. Otherwise we can change her to another ICS/LABA +LAMA that is formulary on her plan

## 2023-02-01 NOTE — Telephone Encounter (Signed)
See mychart encounter 01/20/23

## 2023-02-03 ENCOUNTER — Encounter: Payer: Self-pay | Admitting: Neurology

## 2023-02-03 ENCOUNTER — Ambulatory Visit: Payer: Medicare Other | Admitting: Neurology

## 2023-02-03 VITALS — BP 132/70 | HR 89 | Ht 66.0 in | Wt 171.0 lb

## 2023-02-03 DIAGNOSIS — R269 Unspecified abnormalities of gait and mobility: Secondary | ICD-10-CM

## 2023-02-03 DIAGNOSIS — R296 Repeated falls: Secondary | ICD-10-CM | POA: Diagnosis not present

## 2023-02-03 DIAGNOSIS — R2681 Unsteadiness on feet: Secondary | ICD-10-CM

## 2023-02-03 DIAGNOSIS — M6289 Other specified disorders of muscle: Secondary | ICD-10-CM

## 2023-02-03 DIAGNOSIS — I951 Orthostatic hypotension: Secondary | ICD-10-CM

## 2023-02-03 DIAGNOSIS — E538 Deficiency of other specified B group vitamins: Secondary | ICD-10-CM | POA: Diagnosis not present

## 2023-02-03 MED ORDER — CARBIDOPA-LEVODOPA 25-100 MG PO TABS: 1.0000 | ORAL_TABLET | Freq: Three times a day (TID) | ORAL | 5 refills | Status: DC

## 2023-02-03 NOTE — Patient Instructions (Addendum)
I will increase your Sinemet to 1 mg three times a day.  Continue B12 1000 mcg daily.  We talked about a special brain scan called a DaTscan that we may pursue if the Sinemet does not continue to improve your symptoms.  I will check with you in about 1 month to see how you are doing on the dose change. I will see you back in clinic in 3 months.  Please let me know if you have any questions or concerns in the meantime.  The physicians and staff at [facility] are committed to providing excellent care. You may receive a survey requesting feedback about your experience at our office. We strive to receive "very good" responses to the survey questions. If you feel that your experience would prevent you from giving the office a "very good " response, please contact our office to try to remedy the situation. We may be reached at [phone]. Thank you for taking the time out of your busy day to complete the survey.  Jacquelyne Balint, MD [facility]  Preventing Falls at [facility] are common, often dreaded events in the lives of older people. Aside from the obvious injuries and even death that may result, fall can cause wide-ranging consequences including loss of independence, mental decline, decreased activity and mobility. Younger people are also at risk of falling, especially those with chronic illnesses and fatigue.  Ways to reduce risk for falling Examine diet and medications. Warm foods and alcohol dilate blood vessels, which can lead to dizziness when standing. Sleep aids, antidepressants and pain medications can also increase the likelihood of a fall.  Get a vision exam. Poor vision, cataracts and glaucoma increase the chances of falling.  Check foot gear. Shoes should fit snugly and have a sturdy, nonskid sole and a broad, low heel  Participate in a physician-approved exercise program to build and maintain muscle strength and improve balance and coordination. Programs that use  ankle weights or stretch bands are excellent for muscle-strengthening. Water aerobics programs and low-impact Tai Chi programs have also been shown to improve balance and coordination.  Increase vitamin D intake. Vitamin D improves muscle strength and increases the amount of calcium the body is able to absorb and deposit in bones.  How to prevent falls from common hazards Floors - Remove all loose wires, cords, and throw rugs. Minimize clutter. Make sure rugs are anchored and smooth. Keep furniture in its usual place.  Chairs -- Use chairs with straight backs, armrests and firm seats. Add firm cushions to existing pieces to add height.  Bathroom - Install grab bars and non-skid tape in the tub or shower. Use a bathtub transfer bench or a shower chair with a back support Use an elevated toilet seat and/or safety rails to assist standing from a low surface. Do not use towel racks or bathroom tissue holders to help you stand.  Lighting - Make sure halls, stairways, and entrances are well-lit. Install a night light in your bathroom or hallway. Make sure there is a light switch at the top and bottom of the staircase. Turn lights on if you get up in the middle of the night. Make sure lamps or light switches are within reach of the bed if you have to get up during the night.  Kitchen - Install non-skid rubber mats near the sink and stove. Clean spills immediately. Store frequently used utensils, pots, pans between waist and eye level. This helps prevent reaching and bending. Sit when getting  things out of lower cupboards.  Living room/ Bedrooms - Place furniture with wide spaces in between, giving enough room to move around. Establish a route through the living room that gives you something to hold onto as you walk.  Stairs - Make sure treads, rails, and rugs are secure. Install a rail on both sides of the stairs. If stairs are a threat, it might be helpful to arrange most of your activities on the lower  level to reduce the number of times you must climb the stairs.  Entrances and doorways - Install metal handles on the walls adjacent to the doorknobs of all doors to make it more secure as you travel through the doorway.  Tips for maintaining balance Keep at least one hand free at all times. Try using a backpack or fanny pack to hold things rather than carrying them in your hands. Never carry objects in both hands when walking as this interferes with keeping your balance.  Attempt to swing both arms from front to back while walking. This might require a conscious effort if Parkinson's disease has diminished your movement. It will, however, help you to maintain balance and posture, and reduce fatigue.  Consciously lift your feet off of the ground when walking. Shuffling and dragging of the feet is a common culprit in losing your balance.  When trying to navigate turns, use a "U" technique of facing forward and making a wide turn, rather than pivoting sharply.  Try to stand with your feet shoulder-length apart. When your feet are close together for any length of time, you increase your risk of losing your balance and falling.  Do one thing at a time. Don't try to walk and accomplish another task, such as reading or looking around. The decrease in your automatic reflexes complicates motor function, so the less distraction, the better.  Do not wear rubber or gripping soled shoes, they might "catch" on the floor and cause tripping.  Move slowly when changing positions. Use deliberate, concentrated movements and, if needed, use a grab bar or walking aid. Count 15 seconds between each movement. For example, when rising from a seated position, wait 15 seconds after standing to begin walking.  If balance is a continuous problem, you might want to consider a walking aid such as a cane, walking stick, or walker. Once you've mastered walking with help, you might be ready to try it on your own again.

## 2023-02-08 ENCOUNTER — Telehealth: Payer: Self-pay

## 2023-02-08 NOTE — Telephone Encounter (Signed)
Pt called and reported shortness of breath. She thinks carbidopa-Levodopa could be interacting with another medicine. I asked if she had called her PCP and she had not. She had low oxygen her appointment last week, I told Dr. Loleta Chance of this and he said that if she thanks that , she could stop the Carbidopa- Levodopa, she could go to the low dose she was on before. She said she would call her pcp to get her lungs checked. Told her to call back for any concern.

## 2023-02-15 ENCOUNTER — Other Ambulatory Visit: Payer: Self-pay | Admitting: Pulmonary Disease

## 2023-02-23 DIAGNOSIS — R42 Dizziness and giddiness: Secondary | ICD-10-CM | POA: Diagnosis not present

## 2023-02-23 DIAGNOSIS — H9311 Tinnitus, right ear: Secondary | ICD-10-CM | POA: Diagnosis not present

## 2023-02-23 DIAGNOSIS — M4802 Spinal stenosis, cervical region: Secondary | ICD-10-CM | POA: Diagnosis not present

## 2023-02-23 DIAGNOSIS — R2689 Other abnormalities of gait and mobility: Secondary | ICD-10-CM | POA: Diagnosis not present

## 2023-02-23 DIAGNOSIS — M47814 Spondylosis without myelopathy or radiculopathy, thoracic region: Secondary | ICD-10-CM | POA: Diagnosis not present

## 2023-02-23 DIAGNOSIS — Z79899 Other long term (current) drug therapy: Secondary | ICD-10-CM | POA: Diagnosis not present

## 2023-02-26 ENCOUNTER — Ambulatory Visit (HOSPITAL_BASED_OUTPATIENT_CLINIC_OR_DEPARTMENT_OTHER): Payer: Medicare Other | Admitting: Cardiology

## 2023-03-01 ENCOUNTER — Telehealth (HOSPITAL_BASED_OUTPATIENT_CLINIC_OR_DEPARTMENT_OTHER): Payer: Self-pay | Admitting: Primary Care

## 2023-03-01 ENCOUNTER — Encounter (HOSPITAL_BASED_OUTPATIENT_CLINIC_OR_DEPARTMENT_OTHER): Payer: Self-pay | Admitting: Cardiology

## 2023-03-01 ENCOUNTER — Ambulatory Visit (HOSPITAL_BASED_OUTPATIENT_CLINIC_OR_DEPARTMENT_OTHER): Payer: Medicare Other | Admitting: Cardiology

## 2023-03-01 VITALS — BP 118/66 | HR 90 | Ht 66.0 in | Wt 174.9 lb

## 2023-03-01 DIAGNOSIS — E7800 Pure hypercholesterolemia, unspecified: Secondary | ICD-10-CM | POA: Diagnosis not present

## 2023-03-01 DIAGNOSIS — I10 Essential (primary) hypertension: Secondary | ICD-10-CM

## 2023-03-01 DIAGNOSIS — I428 Other cardiomyopathies: Secondary | ICD-10-CM

## 2023-03-01 DIAGNOSIS — I2510 Atherosclerotic heart disease of native coronary artery without angina pectoris: Secondary | ICD-10-CM

## 2023-03-01 MED ORDER — FUROSEMIDE 20 MG PO TABS: 20.0000 mg | ORAL_TABLET | Freq: Every day | ORAL | 3 refills | Status: DC | PRN

## 2023-03-01 MED ORDER — METOPROLOL SUCCINATE ER 25 MG PO TB24: 25.0000 mg | ORAL_TABLET | Freq: Every day | ORAL | 3 refills | Status: DC

## 2023-03-01 MED ORDER — POTASSIUM CHLORIDE CRYS ER 10 MEQ PO TBCR: 10.0000 meq | EXTENDED_RELEASE_TABLET | Freq: Every day | ORAL | 3 refills | Status: AC | PRN

## 2023-03-01 MED ORDER — ROSUVASTATIN CALCIUM 10 MG PO TABS: 10.0000 mg | ORAL_TABLET | Freq: Every day | ORAL | 3 refills | Status: AC

## 2023-03-01 MED ORDER — VALSARTAN 40 MG PO TABS
40.0000 mg | ORAL_TABLET | Freq: Every day | ORAL | 3 refills | Status: DC
Start: 2023-03-01 — End: 2023-07-28

## 2023-03-01 NOTE — Progress Notes (Signed)
 Cardiology Office Note:  .    Date:  03/01/2023  ID:  April [name], DOB 1955-02-23, MRN [id number] PCP: April Ora, MD  [facility] Providers Cardiologist:  Jodelle Red, MD     History of Present Illness: .    April [name] is a 68 y.o. female with a hx of nonobstructive CAD, COPD gold stage 3, pulmonary nodules, PMR, temporal arteritis who is seen for follow up today. I initially met her 01/2019 as a new consult at the request of Joaquim Nam, MD for the evaluation and management of abnormal echo.   Cardiovascular risk factors: Chronic inflammatory conditions: PMR/temporal arteritis Tobacco use history: former Family history: mother had bypass surgery at age 62 (MI prior), kidney disease, colon cancer. Deceased aged 8. Father died of an accident at age 40, had COPD prior. MGF with stroke, died age 33. No other CVD that she knows of, possibly one other aunt/uncle with a stroke. Prior cardiac testing and/or incidental findings on other testing (ie coronary calcium): aortic calcifications seen on noncontrast ct. Cardiac cath 2006 by Dr. Allyson Sabal, no CAD. Stress test 09/08/2017 without ischemia. Initial EF 40-45% in 2019. Most recent 06/2021 55%.   Today: Doing ok from a heart perspective. Struggling with instability still, recommended for PT but she has to pay $20 copay each visit, which she can't afford. Has been started on sinemet by her neurologist--reports that she felt "weird" at higher dose.  Breathing has been rough, affected by the weather. Dr. Tonia Brooms left, re-establishing with pulmonology, would like to be seen in [facility] office if possible. Had two days of wheezing last week, no cough/fever, improved on its own. She is almost out of [place].  Denies chest pain. No PND, orthopnea, LE edema or unexpected weight gain. No syncope or palpitations. ROS otherwise negative except as noted.   ROS:  Please see the history of present illness. ROS otherwise negative  except as noted.   Studies Reviewed: Marland Kitchen    EKG Interpretation Date/Time:  Monday March 01 2023 14:07:31 EST Ventricular Rate:  84 PR Interval:  158 QRS Duration:  86 QT Interval:  392 QTC Calculation: 463 R Axis:   72  Text Interpretation: Normal sinus rhythm with sinus arrhythmia Nonspecific ST abnormality When compared with ECG of 25-Aug-2022 09:11, No significant change was found Confirmed by Jodelle Red [phone]) on 03/01/2023 2:20:04 PM     Physical Exam:    VS:  BP 118/66   Pulse 90   Ht 5\' 6"  (1.676 m)   Wt 174 lb 14.4 oz (79.3 kg)   SpO2 96%   BMI 28.23 kg/m    Wt Readings from Last 3 Encounters:  03/01/23 174 lb 14.4 oz (79.3 kg)  02/03/23 171 lb (77.6 kg)  12/17/22 173 lb (78.5 kg)    GEN: Well nourished, well developed in no acute distress HEENT: Normal, moist mucous membranes NECK: No JVD CARDIAC: regular rhythm, normal S1 and S2, no rubs or gallops. No murmur. VASCULAR: Radial and DP pulses 2+ bilaterally. No carotid bruits RESPIRATORY:  Clear to auscultation without rales, wheezing or rhonchi  ABDOMEN: Soft, non-tender, non-distended MUSCULOSKELETAL:  Ambulates independently SKIN: Warm and dry, no significant LE edema NEUROLOGIC:  Alert and oriented x 3. No focal neuro deficits noted. PSYCHIATRIC:  Normal affect    ASSESSMENT AND PLAN: .    COPD, with stable shortness of breath -no wheezing on exam today -she plans to follow up with pulmonology in the next several weeks,  would like to be seen in [facility] office if possible   Nonobstructive CAD Aortic atherosclerosis Hypercholesterolemia -coronary CT: calcium score 71, nonobstructive CAD noted -discussed aspirin previously. She is tolerating rosuvastatin -LDL goal <70, last 71 -counseled on red flag warning signs that need immediate medical attention   Abnormal echo, with prior reduced EF consistent with nonischemic cardiomyopathy, EF now normalized -NYHA class I-II.  -stress test negative  for ischemia, remote cath normal, nonobstructive CAD on coronary CT -continue metoprolol succinate 25 mg daily, valsartan 40 mg daily. Limited by low blood pressure -clinically appears euvolemic   Family history of heart disease: no premature disease, but multiple members of the family with ASCVD  Dispo: Follow-up in 6 months, or sooner as needed.  Signed, Jodelle Red, MD

## 2023-03-01 NOTE — Patient Instructions (Signed)
 Medication Instructions:  Your physician recommends that you continue on your current medications as directed. Please refer to the Current Medication list given to you today.  Follow-Up: At [facility], you and your health needs are our priority.  As part of our continuing mission to provide you with exceptional heart care, we have created designated Provider Care Teams.  These Care Teams include your primary Cardiologist (physician) and Advanced Practice Providers (APPs -  Physician Assistants and Nurse Practitioners) who all work together to provide you with the care you need, when you need it.  We recommend signing up for the patient portal called "MyChart".  Sign up information is provided on this After Visit Summary.  MyChart is used to connect with patients for Virtual Visits (Telemedicine).  Patients are able to view lab/test results, encounter notes, upcoming appointments, etc.  Non-urgent messages can be sent to your provider as well.   To learn more about what you can do with MyChart, go to [email or web address].    Your next appointment:   6 month(s)  Provider:   Jodelle Red, MD

## 2023-03-01 NOTE — Telephone Encounter (Signed)
 Patient came in person to change her appointment for the [facility] location. Appointment has been changed to 03/30/23 with Beth. Patient is needing refills of Albuterol, Breztri, and Azithromycin.   Patient has changed pharmacy to [facility] [address], in [address].  Please advise. LOV 09/16/2022

## 2023-03-02 ENCOUNTER — Telehealth: Payer: Self-pay | Admitting: Neurology

## 2023-03-02 MED ORDER — AZITHROMYCIN 250 MG PO TABS: 250.0000 mg | ORAL_TABLET | ORAL | 3 refills | Status: DC

## 2023-03-02 MED ORDER — BREZTRI AEROSPHERE 160-9-4.8 MCG/ACT IN AERO: 2.0000 | INHALATION_SPRAY | Freq: Two times a day (BID) | RESPIRATORY_TRACT | 3 refills | Status: DC

## 2023-03-02 NOTE — Telephone Encounter (Signed)
 Per [facility] Albuterol script was transferred on 02/20/23. Refills sent for [employer]. Patient has f/u with Beth next month.

## 2023-03-02 NOTE — Telephone Encounter (Signed)
 Called patient to check on her symptoms. I am not sure if she is still on Sinemet, so I wanted to check this as well. I left her a message to call back to our office.  Jacquelyne Balint, MD [facility]

## 2023-03-04 ENCOUNTER — Ambulatory Visit: Payer: Self-pay | Admitting: Family Medicine

## 2023-03-04 ENCOUNTER — Telehealth: Payer: Self-pay | Admitting: Neurology

## 2023-03-04 NOTE — Telephone Encounter (Signed)
 Red Word that prompted transfer to Nurse Triage: Hit toes on bedpost and thought they were just bruised. She feels it may be broken.     Chief Complaint: Hit left foot on bed 3 days ago, injured third toe. Bruised, swollen. Symptoms: Pain  Frequency: 3 days ago Pertinent Negatives: Patient denies  Disposition: [] ED /[] Urgent Care (no appt availability in office) / [x] Appointment(In office/virtual)/ []  [facility]/ [] Home Care/ [] Refused Recommended Disposition /[] [facility]/ []  Follow-up with PCP Additional Notes: Agrees with appointment.  Reason for Disposition  [1] Toe injury AND [2] bad limp or can't wear shoes/sandals  Answer Assessment - Initial Assessment Questions 1. MECHANISM: "How did the injury happen?"      Hit on bed post 2. ONSET: "When did the injury happen?" (Minutes or hours ago)      3 days ago 3. LOCATION: "What part of the toe is injured?" "Is the nail damaged?"      Left foot - third toe 4. APPEARANCE of TOE INJURY: "What does the injury look like?"      Bruised, swollen 5. SEVERITY: "Can you use the foot normally?" "Can you walk?"      Hurts 6. SIZE: For cuts, bruises, or swelling, ask: "How large is it?" (e.g., inches or centimeters;  entire toe)      N/A 7. PAIN: "Is there pain?" If Yes, ask: "How bad is the pain?"   (e.g., Scale 1-10; or mild, moderate, severe)     7 8. TETANUS: For any breaks in the skin, ask: "When was the last tetanus booster?"     N/A 9. DIABETES: "Do you have a history of diabetes or poor circulation in the feet?"     No 10. OTHER SYMPTOMS: "Do you have any other symptoms?"        No 11. PREGNANCY: "Is there any chance you are pregnant?" "When was your last menstrual period?"       No  Protocols used: Toe Injury-A-AH

## 2023-03-04 NOTE — Telephone Encounter (Signed)
 Pt called in returning Dr. Adaline Sill call. She says she has gone back to the old dose of the Sinemet. The higher dosage makes her feel drunk.

## 2023-03-05 ENCOUNTER — Ambulatory Visit (INDEPENDENT_AMBULATORY_CARE_PROVIDER_SITE_OTHER): Payer: Medicare Other | Admitting: Physician Assistant

## 2023-03-05 ENCOUNTER — Other Ambulatory Visit: Payer: Self-pay

## 2023-03-05 ENCOUNTER — Encounter: Payer: Self-pay | Admitting: Physician Assistant

## 2023-03-05 ENCOUNTER — Telehealth: Payer: Self-pay | Admitting: Neurology

## 2023-03-05 VITALS — BP 133/79 | HR 74 | Temp 97.8°F | Ht 66.0 in | Wt 170.4 lb

## 2023-03-05 DIAGNOSIS — R269 Unspecified abnormalities of gait and mobility: Secondary | ICD-10-CM

## 2023-03-05 DIAGNOSIS — S99922A Unspecified injury of left foot, initial encounter: Secondary | ICD-10-CM

## 2023-03-05 DIAGNOSIS — R296 Repeated falls: Secondary | ICD-10-CM

## 2023-03-05 DIAGNOSIS — I951 Orthostatic hypotension: Secondary | ICD-10-CM

## 2023-03-05 DIAGNOSIS — M62838 Other muscle spasm: Secondary | ICD-10-CM

## 2023-03-05 DIAGNOSIS — M6289 Other specified disorders of muscle: Secondary | ICD-10-CM

## 2023-03-05 DIAGNOSIS — R2681 Unsteadiness on feet: Secondary | ICD-10-CM

## 2023-03-05 NOTE — Telephone Encounter (Signed)
 I returned patient's call. She had difficulty taking Sinemet 1 tablet 3 times per day. It made her feel drunk. She is taking 1/2 tablet 3 times a day. It may be helping a little, but she is still having imbalance.  She saw audiology on 02/23/23 who found: SUMMARY There is no evidence of a significant peripheral vestibular deficit. The patient was also previously found to have symmetric normal hearing. Abnormal ocular motor assessment, frequent square wave jerks, and failure fixation suppression are all suggestive of a central/cerebellar abnormality. The episodes of vertigo are of unclear etiology but there is no evidence of active BPPV on examination today.  RECOMMENDATIONS  The patient should follow-up with neurology for any additional assessment or treatment as indicated. Neuro-ophthalmology evaluation could also be considered. The patient will contact our clinic for repeat assessment should the episodes of vertigo occur in a more persistent or frequent manner. There is the remote possibility of BPPV with negative examination today. The irregular positional vertigo is not her primary complaint at this time though.   Given her persistent symptoms, possible response to Sinemet, but inability to tolerate higher doses, I recommended DaTscan, which patient was agreeable to get.  I will order today. I instructed patient that we would need to get this approved, but to call if she has not heard from anyone to schedule in the next few weeks.  All questions were answered.  Jacquelyne Balint, MD [facility]

## 2023-03-05 NOTE — Progress Notes (Signed)
 Established patient visit   Patient: April [name]   DOB: December 25, 1955   68 y.o. Female  MRN: [id number] Visit Date: 03/05/2023  Today's healthcare provider: Alfredia Ferguson, PA-C   Chief Complaint  Patient presents with   Toe Injury    Ran into foot board on bed- happened 4 days ago. Left foot-  OTC- tylenol. Hurts when she puts weight down.   Subjective    Pt reports hitting between her second and third toes on her left foot on her bed x 4 days ago. Initially very painful, significant bruising, unable to weight bear. Today, improvement in swelling, bruising, but still pain when walking.  Medications: Outpatient Medications Prior to Visit  Medication Sig   acetaminophen (TYLENOL) 500 MG tablet Take 1,000 mg by mouth every 6 (six) hours as needed for moderate pain or headache.   albuterol (VENTOLIN HFA) 108 (90 Base) MCG/ACT inhaler Inhale 2 puffs into the lungs every 6 hours as needed for wheezing or shortness of breath.   amoxicillin (AMOXIL) 500 MG capsule Take 2,000 mg by mouth once. Take before a dental appt   azithromycin (ZITHROMAX) 250 MG tablet Take 1 tablet (250 mg total) by mouth every Monday, Wednesday, and Friday.   Budeson-Glycopyrrol-Formoterol (BREZTRI AEROSPHERE) 160-9-4.8 MCG/ACT AERO Inhale 2 puffs into the lungs every 12 (twelve) hours.   buPROPion (WELLBUTRIN XL) 300 MG 24 hr tablet TAKE 1 TABLET BY MOUTH EVERY DAY   Calcium 500 MG CHEW Chew 500 mg by mouth 2 (two) times daily.   carbidopa-levodopa (SINEMET IR) 25-100 MG tablet Take 1 tablet by mouth 3 (three) times daily.   Cholecalciferol 250 MCG (10000 UT) CAPS Take 500 capsules by mouth daily.   Cyanocobalamin (VITAMIN B 12 PO) Take by mouth.   furosemide (LASIX) 20 MG tablet Take 1 tablet (20 mg total) by mouth daily as needed for fluid.   ibandronate (BONIVA) 150 MG tablet TAKE 1 TABLET BY MOUTH EACH MONTH WITH 8oz OF plain WATER 60 mins BEFORE first food, drink, OR meds. stay upright FOR 60 MINUTES.    levothyroxine (SYNTHROID) 100 MCG tablet Take 1 tablet (100 mcg total) by mouth daily.   metoprolol succinate (TOPROL-XL) 25 MG 24 hr tablet Take 1 tablet (25 mg total) by mouth daily.   omeprazole (PRILOSEC) 20 MG capsule TAKE 1 CAPSULE BY MOUTH EVERY DAY   Polyethyl Glycol-Propyl Glycol (SYSTANE OP) Place 1 drop into both eyes as needed.   potassium chloride (KLOR-CON M) 10 MEQ tablet Take 1 tablet (10 mEq total) by mouth daily as needed (when you take the lasix).   rosuvastatin (CRESTOR) 10 MG tablet Take 1 tablet (10 mg total) by mouth at bedtime.   Spacer/Aero-Holding Chambers (AEROCHAMBER MV) inhaler Use as instructed   tiZANidine (ZANAFLEX) 4 MG tablet TAKE 1/2 TO 1 TABLET BY MOUTH EVERY 6 HOURS AS NEEDED FOR MUSCLE SPASMS   valsartan (DIOVAN) 40 MG tablet Take 1 tablet (40 mg total) by mouth daily.   vitamin C (ASCORBIC ACID) 250 MG tablet Take 500 mg by mouth daily.   [DISCONTINUED] diazepam (VALIUM) 5 MG tablet Take 1 tablet (5 mg total) by mouth daily as needed for anxiety (vertigo).   No facility-administered medications prior to visit.    Review of Systems  Constitutional:  Negative for fatigue and fever.  Respiratory:  Negative for cough and shortness of breath.   Cardiovascular:  Negative for chest pain and leg swelling.  Gastrointestinal:  Negative for abdominal pain.  Musculoskeletal:  Positive for gait problem.  Neurological:  Negative for dizziness and headaches.       Objective    BP 133/79   Pulse 74   Temp 97.8 F (36.6 C) (Oral)   Ht 5\' 6"  (1.676 m)   Wt 170 lb 6 oz (77.3 kg)   SpO2 95%   BMI 27.50 kg/m    Physical Exam Vitals reviewed.  Constitutional:      Appearance: She is not ill-appearing.  HENT:     Head: Normocephalic.  Eyes:     Conjunctiva/sclera: Conjunctivae normal.  Cardiovascular:     Rate and Rhythm: Normal rate.  Pulmonary:     Effort: Pulmonary effort is normal. No respiratory distress.  Feet:     Comments: L foot  -- Ecchymosis between second and third toes, some to 3rd toe. On exam, no broken skin, bleeding, ulceration.   Tender to touch, pain w/ moving toes. Minimal edema.  Neurological:     Mental Status: She is alert and oriented to person, place, and time.  Psychiatric:        Mood and Affect: Mood normal.        Behavior: Behavior normal.      No results found for any visits on 03/05/23.  Assessment & Plan    Injury of toe on left foot, initial encounter   Buddy taped 2nd and 3rd toes. Advised if pain w/ walking continues another week, would recommend imaging at that time. Otherwise, keep elevated, ice.  Return if symptoms worsen or fail to improve.       Alfredia Ferguson, PA-C  [facility] at [facility] [phone] (phone) [phone] (fax)  [facility]
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
## 2023-03-10 ENCOUNTER — Other Ambulatory Visit: Payer: Self-pay | Admitting: Family Medicine

## 2023-03-10 DIAGNOSIS — E039 Hypothyroidism, unspecified: Secondary | ICD-10-CM

## 2023-03-10 MED ORDER — LEVOTHYROXINE SODIUM 100 MCG PO TABS: 100.0000 ug | ORAL_TABLET | Freq: Every day | ORAL | 3 refills | Status: DC

## 2023-03-10 NOTE — Telephone Encounter (Signed)
 Copied from CRM [id number]. Topic: Clinical - Prescription Issue >> Mar 10, 2023 12:17 PM Theodis Sato wrote: Reason for CRM: Patient states her new preferred pharmacy needs a new order for the  levothyroxine (SYNTHROID) 100 MCG tablet before they will fill it for her. Please send this to [facility] [id number] - [place], [place] - [address] [place] [address] Phone: [phone] Fax: [phone]

## 2023-03-11 ENCOUNTER — Telehealth: Payer: Self-pay

## 2023-03-11 ENCOUNTER — Telehealth: Payer: Self-pay | Admitting: Neurology

## 2023-03-11 NOTE — Telephone Encounter (Signed)
 Left message with the after hour service on 03-11-23   Caller states that she hs been having dizziness for 3 days after being put on new medication. She is having double vision and neck pain as well.  Caller says the EMTcame over and checked vitials and that was WNL     Severe dizziness unable to stand without support and feels like passing out now    Gave call to [facility]

## 2023-03-11 NOTE — Telephone Encounter (Signed)
 Returned patient's call. She has been dizzy for the last 3 days and is very frustrated. She called EMS earlier and reports normal vital signs, so she didn't want to go to the ED saying, "I'll just sit there all day and die." I recommended she at least consider urgent care or ED but be promptly evaluated.   Of note, she has been taking lasix the last few days. She doesn't think she is dehydrated though.  She is concerned about Sinemet causing symptoms. I recommended she just stop it for now. DaTscan is pending approval.  All questions were answered.  Jacquelyne Balint, MD [facility]

## 2023-03-16 ENCOUNTER — Ambulatory Visit: Payer: Medicare Other | Admitting: Primary Care

## 2023-03-17 NOTE — Telephone Encounter (Signed)
 April [name]

## 2023-03-23 ENCOUNTER — Encounter: Payer: Self-pay | Admitting: Rheumatology

## 2023-03-23 ENCOUNTER — Ambulatory Visit: Payer: Medicare Other | Attending: Rheumatology | Admitting: Rheumatology

## 2023-03-23 VITALS — BP 125/82 | HR 70 | Resp 14 | Ht 66.0 in | Wt 175.2 lb

## 2023-03-23 DIAGNOSIS — F419 Anxiety disorder, unspecified: Secondary | ICD-10-CM

## 2023-03-23 DIAGNOSIS — Z96612 Presence of left artificial shoulder joint: Secondary | ICD-10-CM

## 2023-03-23 DIAGNOSIS — Z8781 Personal history of (healed) traumatic fracture: Secondary | ICD-10-CM

## 2023-03-23 DIAGNOSIS — R29898 Other symptoms and signs involving the musculoskeletal system: Secondary | ICD-10-CM | POA: Diagnosis not present

## 2023-03-23 DIAGNOSIS — Z8709 Personal history of other diseases of the respiratory system: Secondary | ICD-10-CM

## 2023-03-23 DIAGNOSIS — I10 Essential (primary) hypertension: Secondary | ICD-10-CM

## 2023-03-23 DIAGNOSIS — M810 Age-related osteoporosis without current pathological fracture: Secondary | ICD-10-CM

## 2023-03-23 DIAGNOSIS — Z87891 Personal history of nicotine dependence: Secondary | ICD-10-CM

## 2023-03-23 DIAGNOSIS — Z5181 Encounter for therapeutic drug level monitoring: Secondary | ICD-10-CM

## 2023-03-23 DIAGNOSIS — Z8639 Personal history of other endocrine, nutritional and metabolic disease: Secondary | ICD-10-CM | POA: Diagnosis not present

## 2023-03-23 DIAGNOSIS — F32A Depression, unspecified: Secondary | ICD-10-CM

## 2023-03-23 DIAGNOSIS — R768 Other specified abnormal immunological findings in serum: Secondary | ICD-10-CM

## 2023-03-23 DIAGNOSIS — M353 Polymyalgia rheumatica: Secondary | ICD-10-CM | POA: Diagnosis not present

## 2023-03-23 DIAGNOSIS — R296 Repeated falls: Secondary | ICD-10-CM | POA: Diagnosis not present

## 2023-03-23 DIAGNOSIS — M316 Other giant cell arteritis: Secondary | ICD-10-CM

## 2023-03-23 DIAGNOSIS — R7689 Other specified abnormal immunological findings in serum: Secondary | ICD-10-CM

## 2023-03-23 DIAGNOSIS — M47816 Spondylosis without myelopathy or radiculopathy, lumbar region: Secondary | ICD-10-CM | POA: Diagnosis not present

## 2023-03-23 DIAGNOSIS — Z96643 Presence of artificial hip joint, bilateral: Secondary | ICD-10-CM

## 2023-03-26 DIAGNOSIS — R350 Frequency of micturition: Secondary | ICD-10-CM | POA: Diagnosis not present

## 2023-03-30 ENCOUNTER — Other Ambulatory Visit (HOSPITAL_COMMUNITY): Payer: Self-pay

## 2023-03-30 ENCOUNTER — Telehealth: Payer: Self-pay | Admitting: Primary Care

## 2023-03-30 ENCOUNTER — Encounter (HOSPITAL_BASED_OUTPATIENT_CLINIC_OR_DEPARTMENT_OTHER): Payer: Self-pay | Admitting: Primary Care

## 2023-03-30 ENCOUNTER — Ambulatory Visit (INDEPENDENT_AMBULATORY_CARE_PROVIDER_SITE_OTHER): Payer: Medicare Other | Admitting: Primary Care

## 2023-03-30 VITALS — BP 122/80 | HR 75 | Ht 66.0 in | Wt 170.4 lb

## 2023-03-30 DIAGNOSIS — J449 Chronic obstructive pulmonary disease, unspecified: Secondary | ICD-10-CM

## 2023-03-30 DIAGNOSIS — R911 Solitary pulmonary nodule: Secondary | ICD-10-CM

## 2023-03-30 MED ORDER — BREZTRI AEROSPHERE 160-9-4.8 MCG/ACT IN AERO: 2.0000 | INHALATION_SPRAY | Freq: Two times a day (BID) | RESPIRATORY_TRACT | 3 refills | Status: DC

## 2023-03-30 NOTE — Progress Notes (Signed)
 @Patient  ID: April [name], female    DOB: [birth year], 68 y.o.   MRN: [id number]  Chief Complaint  Patient presents with   Follow-up    COPD, Asthma    Referring provider: Willow Ora, MD  HPI: 68 year old, former smoker. Former Dr. Tonia Brooms patient followed for severe COPD on triple therapy and Azithromycin MWF  Previous [facility] pulmonary encounter OV 09/16/2022: Here today for follow-up regarding COPD management.She is also enrolled in lung cancer screening and has a repeat lung cancer screening CT due in December 2024.  Last office visit we discussed her ongoing physical therapy needs.  Using triple therapy inhaler regimen as well as azithromycin Monday Wednesday Friday.  Doing well with her current regimen with no recent exacerbations.   03/30/2023 Discussed the use of AI scribe software for clinical note transcription with the patient, who gave verbal consent to proceed.  History of Present Illness   April [name] is a 68 year old female with COPD and a lung nodule who presents for follow-up.  She has severe COPD with an asthmatic component and experiences increased wheezing. She uses Breztri, taking two puffs in the morning and evening, but sometimes requires three puffs to alleviate wheezing. She also uses azithromycin on Monday, Wednesday, and Friday. A nebulizer is available at home, and she has a history of long-term prednisone use in the 1990s.  She is part of a lung cancer screening program due to her history as a former smoker, having quit six to seven years ago. Her last CT scan in December 2024 showed some scarring and emphysema but no concerning lung nodules. A calcified granuloma measuring six millimeters is present in the right upper lung, which has not changed in size.  She is experiencing balance issues and has fallen recently, resulting in bruises, particularly on her left arm. She uses a walker for mobility and has undergone extensive testing to determine the cause  of her balance problems. She is scheduled for a brain scan with neurology on April 1st to further investigate these issues.  Her kidney function was partially compromised with a glomerular filtration rate of 47, as noted in her labs from December.  She is concerned about the cost of medications, noting that Markus Daft was initially expensive, and is cautious about the cost of new medications.      Pulmonary testing:   12/07/2018 PFTs>> FEV1 0.93 (34%)/ Severe obstruction with significant BD response   Imaging: 12/14/22 LDCT >> Lung RADS 1, negative. Biapical pleuroparenchymal scarring. Centrilobular emphysema. 6mm calcified granuloma RUL    Allergies  Allergen Reactions   Sulfonamide Derivatives     As a child.  Throat closed, rash.   Alendronate Sodium     GI upset   Dilaudid [Hydromorphone Hcl] Nausea And Vomiting   Sulfa Antibiotics Hives   Other Other (See Comments)    Adhesive    Wound Dressing Adhesive Other (See Comments)    Redness, adhesive tapes. Needs PAPER TAPE.    Immunization History  Administered Date(s) Administered   Fluad Quad(high Dose 65+) 08/26/2018, 11/24/2019, 09/30/2020, 10/08/2021   Fluad Trivalent(High Dose 65+) 10/05/2022   H1N1 12/07/2007   Influenza Split 10/15/2010   Influenza, Seasonal, Injecte, Preservative Fre 09/26/2009   Influenza,inj,Quad PF,6+ Mos 10/05/2012, 10/24/2013, 10/12/2014, 11/12/2015, 10/16/2016, 10/14/2017   Influenza-Unspecified 11/10/2004, 10/20/2005, 10/25/2006, 10/18/2007, 10/09/2008, 08/26/2018   PFIZER Comirnaty(Gray Top)Covid-19 Tri-Sucrose Vaccine 07/30/2020   PFIZER(Purple Top)SARS-COV-2 Vaccination 03/05/2019, 04/04/2019, 10/27/2019, 07/30/2020   Pneumococcal Conjugate-13 10/26/2018   Pneumococcal  Polysaccharide-23 11/10/2004, 01/05/2009, 09/26/2009, 11/24/2019   Tdap 10/05/2006, 04/06/2011   Zoster Recombinant(Shingrix) 08/20/2020, 09/05/2020, 01/05/2021, 01/23/2021    Past Medical History:  Diagnosis Date   Allergy     Anemia    Anxiety    Anxiety and depression    related to caring for mother during terminal illness   Arthritis    Asthma    AVN (avascular necrosis of bone) (HCC)    hip and wrist   Bronchitis    hx of   Cataract    Chronic kidney disease    COPD (chronic obstructive pulmonary disease) (HCC)    Depression    Emphysema of lung (HCC)    Endometriosis    FH: CAD (coronary artery disease)    GERD (gastroesophageal reflux disease)    ocassional   History of blood transfusion    after hip repackment - broke out in hives and started itching   History of palpitations    Hyperlipidemia    Hypertension    Hypothyroid    Neuromuscular disorder (HCC)    Non-ischemic cardiomyopathy (HCC) 2019   Osteopenia    forteo through Dr. Corliss Skains (started 10/12)   Osteoporosis    PMR (polymyalgia rheumatica) (HCC)    SIRS (systemic inflammatory response syndrome) (HCC) 10/2017   Smoker    SOB (shortness of breath) on exertion    Squamous cell carcinoma    facial, 2016   Temporal arteritis (HCC)    s/p prednisone taper    Tobacco History: Social History   Tobacco Use  Smoking Status Former   Current packs/day: 0.00   Average packs/day: 0.3 packs/day for 34.0 years (11.2 ttl pk-yrs)   Types: Cigarettes   Start date: 10/25/1983   Quit date: 10/24/2017   Years since quitting: 5.4   Passive exposure: Past (dad smoked)  Smokeless Tobacco Never  Tobacco Comments   quit smoking october 2019   Counseling given: Not Answered Tobacco comments: quit smoking october 2019   Outpatient Medications Prior to Visit  Medication Sig Dispense Refill   acetaminophen (TYLENOL) 500 MG tablet Take 1,000 mg by mouth every 6 (six) hours as needed for moderate pain or headache.     albuterol (VENTOLIN HFA) 108 (90 Base) MCG/ACT inhaler Inhale 2 puffs into the lungs every 6 hours as needed for wheezing or shortness of breath. 8.5 g 5   amoxicillin (AMOXIL) 500 MG capsule Take 2,000 mg by mouth once.  Take before a dental appt     azithromycin (ZITHROMAX) 250 MG tablet Take 1 tablet (250 mg total) by mouth every Monday, Wednesday, and Friday. 36 tablet 3   Budeson-Glycopyrrol-Formoterol (BREZTRI AEROSPHERE) 160-9-4.8 MCG/ACT AERO Inhale 2 puffs into the lungs every 12 (twelve) hours. 32.1 g 3   buPROPion (WELLBUTRIN XL) 300 MG 24 hr tablet TAKE 1 TABLET BY MOUTH EVERY DAY 90 tablet 3   Calcium 500 MG CHEW Chew 500 mg by mouth 2 (two) times daily.     carbidopa-levodopa (SINEMET IR) 25-100 MG tablet Take 1 tablet by mouth 3 (three) times daily. 90 tablet 5   Cholecalciferol 250 MCG (10000 UT) CAPS Take 500 capsules by mouth daily.     Cyanocobalamin (VITAMIN B 12 PO) Take by mouth.     furosemide (LASIX) 20 MG tablet Take 1 tablet (20 mg total) by mouth daily as needed for fluid. 30 tablet 3   levothyroxine (SYNTHROID) 100 MCG tablet Take 1 tablet (100 mcg total) by mouth daily. 90 tablet 3   metoprolol  succinate (TOPROL-XL) 25 MG 24 hr tablet Take 1 tablet (25 mg total) by mouth daily. 90 tablet 3   omeprazole (PRILOSEC) 20 MG capsule TAKE 1 CAPSULE BY MOUTH EVERY DAY 90 capsule 3   Polyethyl Glycol-Propyl Glycol (SYSTANE OP) Place 1 drop into both eyes as needed.     potassium chloride (KLOR-CON M) 10 MEQ tablet Take 1 tablet (10 mEq total) by mouth daily as needed (when you take the lasix). 30 tablet 3   rosuvastatin (CRESTOR) 10 MG tablet Take 1 tablet (10 mg total) by mouth at bedtime. 90 tablet 3   Spacer/Aero-Holding Chambers (AEROCHAMBER MV) inhaler Use as instructed 1 each 0   tiZANidine (ZANAFLEX) 4 MG tablet TAKE 1/2 TO 1 TABLET BY MOUTH EVERY 6 HOURS AS NEEDED FOR MUSCLE SPASMS 90 tablet 0   valsartan (DIOVAN) 40 MG tablet Take 1 tablet (40 mg total) by mouth daily. 90 tablet 3   vitamin C (ASCORBIC ACID) 250 MG tablet Take 500 mg by mouth daily.     ibandronate (BONIVA) 150 MG tablet TAKE 1 TABLET BY MOUTH EACH MONTH WITH 8oz OF plain WATER 60 mins BEFORE first food, drink, OR meds.  stay upright FOR 60 MINUTES. (Patient not taking: Reported on 03/30/2023) 4 tablet 0   No facility-administered medications prior to visit.    Review of Systems  Review of Systems  Constitutional: Negative.   HENT: Negative.    Respiratory:  Positive for shortness of breath and wheezing. Negative for cough.     Physical Exam  BP 122/80   Pulse 75   Ht 5\' 6"  (1.676 m)   Wt 170 lb 6.4 oz (77.3 kg)   SpO2 94%   BMI 27.50 kg/m  Physical Exam Constitutional:      General: She is not in acute distress.    Appearance: Normal appearance. She is not ill-appearing.  HENT:     Head: Normocephalic and atraumatic.     Mouth/Throat:     Mouth: Mucous membranes are moist.     Pharynx: Oropharynx is clear.  Cardiovascular:     Rate and Rhythm: Normal rate and regular rhythm.  Pulmonary:     Effort: Pulmonary effort is normal.     Breath sounds: Normal breath sounds.  Musculoskeletal:        General: Normal range of motion.  Skin:    General: Skin is warm and dry.  Neurological:     General: No focal deficit present.     Mental Status: She is oriented to person, place, and time. Mental status is at baseline.  Psychiatric:        Mood and Affect: Mood normal.        Behavior: Behavior normal.        Thought Content: Thought content normal.        Judgment: Judgment normal.     Lab Results:  CBC    Component Value Date/Time   WBC 7.0 12/08/2022 1039   RBC 4.06 12/08/2022 1039   HGB 12.6 12/08/2022 1039   HCT 38.5 12/08/2022 1039   PLT 179.0 12/08/2022 1039   MCV 94.7 12/08/2022 1039   MCH 31.2 01/15/2022 1228   MCHC 32.8 12/08/2022 1039   RDW 14.0 12/08/2022 1039   LYMPHSABS 0.8 12/08/2022 1039   MONOABS 0.4 12/08/2022 1039   EOSABS 0.1 12/08/2022 1039   BASOSABS 0.1 12/08/2022 1039    BMET    Component Value Date/Time   NA 139 12/08/2022 1039   NA 138  01/31/2021 1029   K 3.9 12/08/2022 1039   CL 106 12/08/2022 1039   CO2 28 12/08/2022 1039   GLUCOSE 119 (H)  12/08/2022 1039   BUN 21 12/08/2022 1039   BUN 15 01/31/2021 1029   CREATININE 1.18 12/08/2022 1039   CREATININE 1.13 (H) 06/03/2016 1213   CALCIUM 9.2 12/08/2022 1039   GFRNONAA 57 (L) 01/15/2022 1228   GFRNONAA 52 (L) 06/03/2016 1213   GFRAA 50 (L) 06/13/2019 0955   GFRAA 60 06/03/2016 1213    BNP No results found for: "BNP"  ProBNP    Component Value Date/Time   PROBNP 35.0 08/25/2019 0815    Imaging: No results found.   Assessment & Plan:   No problem-specific Assessment & Plan notes found for this encounter.  Assessment and Plan    Chronic Obstructive Pulmonary Disease (COPD) with asthmatic component Severe COPD with asthmatic component, experiencing increased wheezing and decreased activity tolerance. Current treatment with Markus Daft is inadequate. Discussed treatment options including nebulizer, prednisone and biologics. Ideally would like to avoid recurrent or chronic prednisone use. Paulene Floor discussed as a safer alternative in conjunction with Breztri, with cost considerations. If Paulene Floor is not affordable would proceed with addition of  Ohtuvarye to decrease flare ups and Dupixent considered a last resort. - Refill Breztri and continue as directed every 12 hours  - Consider short course of prednisone if symptoms persist. - Prescribe Airsupra as a rescue inhaler, pending cost assessment. - If Paulene Floor is unaffordable, consider Ohtuvayre nebulizer. - Follow up in three months to reassess symptoms and treatment efficacy.  Lung nodule Stable 6 mm calcified granuloma in the right upper lung, consistent with non-malignant findings. Continue annual follow-up with lung cancer screening program  Frequent falls and balance issues Reports frequent falls and balance issues, under evaluation by neurology. No significant injuries from recent fall. Uses a walker for mobility.       Glenford Bayley, NP 03/30/2023

## 2023-03-30 NOTE — Telephone Encounter (Signed)
 Can you let patient know the price of the rescue inhaler is 47 dollars  If this is not affordable we can proceed with the nebulizer Ohtuvayre. We will need to send paperwork to our pharmacy team

## 2023-03-30 NOTE — Telephone Encounter (Signed)
 Through [facility] the price is currently $47.00 due to e-voucher from manufacture being applied with the patients insurance plan. Price may vary at other pharmacies depending on if their system can pull this information.

## 2023-03-30 NOTE — Telephone Encounter (Signed)
 What is the cost for Aisupra, when going to order it say T3/5

## 2023-03-30 NOTE — Telephone Encounter (Signed)
 Paperwork faxed

## 2023-03-30 NOTE — Patient Instructions (Addendum)
 -CHRONIC OBSTRUCTIVE PULMONARY DISEASE (COPD) WITH ASTHMATIC COMPONENT: COPD is a chronic lung disease that makes it hard to breathe. You are experiencing increased wheezing and decreased activity tolerance. We discussed several treatment options, including continuing Breztri and trying Airsupra (albuterol-budesonide) as a rescue inhaler every 4-6 hours for wheezing if it is affordable. If Paulene Floor is too expensive, we may consider Ohtuvayre nebulizer (this is not a steroid and is used to decrease inflammation) twice daily to decrease inflammation and COPD flare-ups. If your symptoms persist despite above plan, we might try a biologic such as Dupixent. We will follow up in three months to reassess your symptoms and treatment efficacy.  -LUNG NODULE: A lung nodule is a small growth in the lung. Your 6 mm calcified granuloma in the right upper lung has not changed in size and is unlikely to become malignant.  -SMOKING CESSATION: You are a former smoker who quit approximately 6-7 years ago and are participating in a lung cancer screening program with low-dose CT scans.  -FREQUENT FALLS AND BALANCE ISSUES: You have been experiencing frequent falls and balance issues, which are being evaluated by neurology. You use a walker for mobility and have not had any significant injuries from your recent fall.  Follow-up 3 months with APP either Beth or Florentina Addison / and patient needs to set up a visit with Dr. Everardo All in fall   Ensifentrine Nebulizer Suspension What is this medication? ENSIFENTRINE (EN si FEN treen) treats chronic obstructive pulmonary disease (COPD). It works by decreasing inflammation and relaxing muscles in the airway. This makes it easier to breathe. Do not use it to treat a sudden COPD flare-up. This medicine may be used for other purposes; ask your health care provider or pharmacist if you have questions. COMMON BRAND NAME(S): Ohtuvayre What should I tell my care team before I take this  medication? They need to know if you have any of these conditions: Liver disease Mental health conditions An unusual or allergic reaction to ensifentrine, other medications, foods, dyes, or preservatives Pregnant or trying to get pregnant Breastfeeding How should I use this medication? This medication is for inhalation using a nebulizer. Nebulizers make a liquid into an aerosol that you breathe in through your mouth or your mouth and nose and into your lungs. Do not mix this medication with other medications in the nebulizer. Take it as directed on the prescription label. Shake the ampule well before using. Do not use it more often than directed. Keep taking this medication unless your care team tells you to stop. This medication comes with INSTRUCTIONS FOR USE. Ask your pharmacist for directions on how to use this medication. Read the information carefully. Talk to your pharmacist or care team if you have questions. Talk to your care team about the use of this medication in children. Special care may be needed. Overdosage: If you think you have taken too much of this medicine contact a poison control center or emergency room at once. NOTE: This medicine is only for you. Do not share this medicine with others. What if I miss a dose? If you miss a dose, use it as soon as you can. If it is almost time for your next dose, use only that dose. Do not use double or extra doses. What may interact with this medication? Interactions are not expected. This list may not describe all possible interactions. Give your health care provider a list of all the medicines, herbs, non-prescription drugs, or dietary supplements you use. Also  tell them if you smoke, drink alcohol, or use illegal drugs. Some items may interact with your medicine. What should I watch for while using this medication? Visit your care team for regular checks on your progress. Tell your care team if your symptoms do not start to get better or  if they get worse. What side effects may I notice from receiving this medication? Side effects that you should report to your care team as soon as possible: Allergic reactions--skin rash, itching, hives, swelling of the face, lips, tongue, or throat Mood and behavior changes--anxiety, nervousness, confusion, hallucinations, irritability, hostility, thoughts of suicide or self-harm, worsening mood, feelings of depression Wheezing or trouble breathing that is worse after use Side effects that usually do not require medical attention (report these to your care team if they continue or are bothersome): Back pain Diarrhea Trouble sleeping This list may not describe all possible side effects. Call your doctor for medical advice about side effects. You may report side effects to FDA at 1-800-FDA-1088. Where should I keep my medication? Keep out of the reach of children and pets. Store at room temperature between 20 and 25 degrees C (68 and 77 degrees F). Keep unopened ampules in the foil pouch. Protect from light. Avoid exposure to extreme heat and cold. Get rid of any unused medication after the expiration date. To get rid of medications that are no longer needed or have expired: Take the medication to a medication take-back program. Check with your pharmacy or law enforcement to find a location. If you cannot return the medication, check the label or package insert to see if the medication should be thrown out in the garbage or flushed down the toilet. If you are not sure, ask your care team. If it is safe to put it in the trash, empty the medication out of the container. Mix the medication with cat litter, dirt, coffee grounds, or other unwanted substance. Seal the mixture in a bag or container. Put it in the trash. NOTE: This sheet is a summary. It may not cover all possible information. If you have questions about this medicine, talk to your doctor, pharmacist, or health care provider.  2024  Elsevier/Gold Standard (2022-12-04 00:00:00)

## 2023-03-30 NOTE — Telephone Encounter (Signed)
 Patient is aware paperwork will be sent to our pharmacy team.

## 2023-04-02 NOTE — Telephone Encounter (Signed)
 Marland Kitchen

## 2023-04-06 ENCOUNTER — Telehealth: Payer: Self-pay | Admitting: Pharmacist

## 2023-04-06 ENCOUNTER — Encounter (HOSPITAL_COMMUNITY)

## 2023-04-06 ENCOUNTER — Encounter (HOSPITAL_COMMUNITY)
Admission: RE | Admit: 2023-04-06 | Discharge: 2023-04-06 | Disposition: A | Discharge status: Home / Self Care | Source: Ambulatory Visit | Attending: Neurology | Admitting: Neurology

## 2023-04-06 DIAGNOSIS — R269 Unspecified abnormalities of gait and mobility: Secondary | ICD-10-CM | POA: Diagnosis not present

## 2023-04-06 DIAGNOSIS — I951 Orthostatic hypotension: Secondary | ICD-10-CM | POA: Diagnosis not present

## 2023-04-06 DIAGNOSIS — M6289 Other specified disorders of muscle: Secondary | ICD-10-CM | POA: Diagnosis not present

## 2023-04-06 DIAGNOSIS — R2681 Unsteadiness on feet: Secondary | ICD-10-CM | POA: Diagnosis not present

## 2023-04-06 DIAGNOSIS — M62838 Other muscle spasm: Secondary | ICD-10-CM | POA: Insufficient documentation

## 2023-04-06 DIAGNOSIS — R296 Repeated falls: Secondary | ICD-10-CM | POA: Insufficient documentation

## 2023-04-06 DIAGNOSIS — R930 Abnormal findings on diagnostic imaging of skull and head, not elsewhere classified: Secondary | ICD-10-CM | POA: Diagnosis not present

## 2023-04-06 MED ORDER — SODIUM IODIDE I-123 7.4 MBQ CAPS
4.4600 | ORAL_CAPSULE | Freq: Once | ORAL | Status: AC
  Administered 2023-04-06: 4.46 via ORAL

## 2023-04-06 MED ORDER — POTASSIUM IODIDE (ANTIDOTE) 130 MG PO TABS
ORAL_TABLET | ORAL | Status: AC
  Filled 2023-04-06: qty 1

## 2023-04-06 NOTE — Telephone Encounter (Signed)
 Received Ohtuvayre new start paperwork. Completed form and faxed with clinicals and insurance card copy to [facility] [employer]   Phone#: [phone] Fax#: [phone]   Chesley Mires, PharmD, MPH, BCPS, CPP Clinical Pharmacist (Rheumatology and Pulmonology)

## 2023-04-07 ENCOUNTER — Telehealth: Payer: Self-pay | Admitting: Neurology

## 2023-04-07 NOTE — Telephone Encounter (Signed)
 Patient confirmed she had stopped Bupropion prior to the DaTscan. Her DaTscan showed:  Decreased radiotracer activity in the bilateral putamen and head of the RIGHT caudate nucleus. Findings are suspicious for Parkinson's syndrome pathology.  Getting more history, she tends to fall backwards or to the left. She has fallen 3 times this week. I am concerned she may have PSP.  She is agreeable to restarting Sinemet 25-100 mg tablet TID as she did not get improvement of symptoms of dizziness that she thought was a side effects.  She has follow up with me on 04/27/23. I will also discuss with my colleagues if movement disorder specialist is appropriate to get best care for patient.  All questions were answered.  April Balint, MD [facility]

## 2023-04-08 ENCOUNTER — Ambulatory Visit (INDEPENDENT_AMBULATORY_CARE_PROVIDER_SITE_OTHER): Admitting: Family Medicine

## 2023-04-08 ENCOUNTER — Encounter: Payer: Self-pay | Admitting: Family Medicine

## 2023-04-08 VITALS — BP 123/79 | HR 77 | Temp 97.7°F | Ht 66.0 in | Wt 172.2 lb

## 2023-04-08 DIAGNOSIS — R233 Spontaneous ecchymoses: Secondary | ICD-10-CM | POA: Diagnosis not present

## 2023-04-08 DIAGNOSIS — L908 Other atrophic disorders of skin: Secondary | ICD-10-CM

## 2023-04-08 DIAGNOSIS — G20B2 Parkinson's disease with dyskinesia, with fluctuations: Secondary | ICD-10-CM | POA: Insufficient documentation

## 2023-04-08 DIAGNOSIS — T380X5A Adverse effect of glucocorticoids and synthetic analogues, initial encounter: Secondary | ICD-10-CM | POA: Diagnosis not present

## 2023-04-08 NOTE — Progress Notes (Signed)
 Subjective  CC:  Chief Complaint  Patient presents with   referral    Referral to hematologist bc she thinks there is something else going on with here bruising so easily     HPI: April [name] is a 68 y.o. female who presents to the office today to address the problems listed above in the chief complaint. Discussed the use of AI scribe software for clinical note transcription with the patient, who gave verbal consent to proceed.  History of Present Illness   April [name] presents due to concerns regarding easy bruising.  She has multiple bruises on her forearms and some on her lower extremities.  She admits, these typically come from falls but she does note that even with light touch she can have some bruising.  No bruising on torso with falls.  No bleeding, specifically no epistaxis or doses.  No problems with bleeding during surgeries.  She has had history of chronic steroid use for pulmonary disease and PMR.  Reviewed blood counts and coag studies which are all normal.  She is tired of her unsteady gait and frequent falls.  She is undergoing workup through neurology.  Recent DAT scan was positive for Parkinson's although her gait abnormality is out of proportion to typical Parkinson syndrome.  She is being referred to a further neurologist movement disorder specialist.  She has had a thorough evaluation including brain imaging and extensive blood testing without any other detailed findings.  I am following along with neurology.  Lab Results  Component Value Date   WBC 7.0 12/08/2022   HGB 12.6 12/08/2022   HCT 38.5 12/08/2022   MCV 94.7 12/08/2022   PLT 179.0 12/08/2022   Lab Results  Component Value Date   INR 0.9 01/15/2022   INR 0.99 09/29/2012   INR 0.99 06/12/2010         Assessment  1. Atrophy of skin due to systemic corticosteroid   2. Easy bruising   3. Parkinson's disease with dyskinesia and fluctuating manifestations (HCC)      Plan  Assessment and Plan     Parkinson's disease Diagnosed with Parkinson's disease confirmed by DAT scan, causing frequent falls. Referred to a specialist for symptom management. - Refer to a neurologist specializing in Parkinson's disease for further management.  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 Bruising exacerbated by steroid dermopathy and aging. - Educated on effects of long-term steroid use on skin integrity. - Advised protective measures to prevent falls and skin injuries. - Consider checking coagulation studies if clinically indicated.        No orders of the defined types were placed in this encounter.  No orders of the defined types were placed in this encounter.    I reviewed the patients updated PMH, FH, and SocHx.    Patient Active Problem List   Diagnosis Date Noted   Mixed hyperlipidemia 12/03/2021    Priority: High   Chronic diastolic CHF (congestive heart failure) (HCC) 06/13/2021    Priority: High   COPD, severe (HCC) 11/24/2017    Priority: High   CKD (chronic kidney disease), stage III (HCC) 10/28/2017    Priority: High   Vertigo 10/27/2017    Priority: High   Essential hypertension 06/23/2017    Priority: High   Polymyalgia rheumatica (HCC) 05/22/2016    Priority: High   Asthma 03/23/2010    Priority: High   Acquired hypothyroidism 03/21/2010  Priority: High   Chronic recurrent major depressive disorder (HCC) 03/21/2010    Priority: High   Upper airway cough syndrome 05/07/2021    Priority: Medium    Osteoporosis 03/23/2021    Priority: Medium    Aortic atherosclerosis (HCC) 12/17/2017    Priority: Medium    Pulmonary nodule 12/17/2017    Priority: Medium    GERD (gastroesophageal reflux disease) 12/12/2017    Priority: Medium    History of bilateral hip replacements 05/22/2016    Priority: Medium    DDD (degenerative disc disease), lumbar 05/22/2016     Priority: Medium    Former smoker 03/21/2010    Priority: Medium    History of temporal arteritis 03/21/2010    Priority: Medium    Allergic rhinitis 04/06/2019    Priority: Low   History of avascular necrosis of capital femoral epiphysis 08/14/2014    Priority: Low   Atrophy of skin due to systemic corticosteroid 04/08/2023   Parkinson's disease with dyskinesia and fluctuating manifestations (HCC) 04/08/2023   Bilateral lower extremity edema 05/27/2022   Frequent falls 03/05/2022   Serrated polyp of colon 03/05/2022   Unsteady gait 07/20/2021   Current Meds  Medication Sig   acetaminophen (TYLENOL) 500 MG tablet Take 1,000 mg by mouth every 6 (six) hours as needed for moderate pain or headache.   albuterol (VENTOLIN HFA) 108 (90 Base) MCG/ACT inhaler Inhale 2 puffs into the lungs every 6 hours as needed for wheezing or shortness of breath.   amoxicillin (AMOXIL) 500 MG capsule Take 2,000 mg by mouth once. Take before a dental appt   azithromycin (ZITHROMAX) 250 MG tablet Take 1 tablet (250 mg total) by mouth every Monday, Wednesday, and Friday.   budeson-glycopyrrolate-formoterol (BREZTRI AEROSPHERE) 160-9-4.8 MCG/ACT AERO Inhale 2 puffs into the lungs every 12 (twelve) hours.   buPROPion (WELLBUTRIN XL) 300 MG 24 hr tablet TAKE 1 TABLET BY MOUTH EVERY DAY   Calcium 500 MG CHEW Chew 500 mg by mouth 2 (two) times daily.   carbidopa-levodopa (SINEMET IR) 25-100 MG tablet Take 1 tablet by mouth 3 (three) times daily.   Cholecalciferol 250 MCG (10000 UT) CAPS Take 500 capsules by mouth daily.   Cyanocobalamin (VITAMIN B 12 PO) Take by mouth.   furosemide (LASIX) 20 MG tablet Take 1 tablet (20 mg total) by mouth daily as needed for fluid.   ibandronate (BONIVA) 150 MG tablet TAKE 1 TABLET BY MOUTH EACH MONTH WITH 8oz OF plain WATER 60 mins BEFORE first food, drink, OR meds. stay upright FOR 60 MINUTES.   levothyroxine (SYNTHROID) 100 MCG tablet Take 1 tablet (100 mcg total) by mouth daily.    metoprolol succinate (TOPROL-XL) 25 MG 24 hr tablet Take 1 tablet (25 mg total) by mouth daily.   omeprazole (PRILOSEC) 20 MG capsule TAKE 1 CAPSULE BY MOUTH EVERY DAY   Polyethyl Glycol-Propyl Glycol (SYSTANE OP) Place 1 drop into both eyes as needed.   potassium chloride (KLOR-CON M) 10 MEQ tablet Take 1 tablet (10 mEq total) by mouth daily as needed (when you take the lasix).   rosuvastatin (CRESTOR) 10 MG tablet Take 1 tablet (10 mg total) by mouth at bedtime.   Spacer/Aero-Holding Chambers (AEROCHAMBER MV) inhaler Use as instructed   tiZANidine (ZANAFLEX) 4 MG tablet TAKE 1/2 TO 1 TABLET BY MOUTH EVERY 6 HOURS AS NEEDED FOR MUSCLE SPASMS   valsartan (DIOVAN) 40 MG tablet Take 1 tablet (40 mg total) by mouth daily.   vitamin C (ASCORBIC ACID) 250 MG  tablet Take 500 mg by mouth daily.    Allergies: Patient is allergic to sulfonamide derivatives, alendronate sodium, dilaudid [hydromorphone hcl], sulfa antibiotics, other, and wound dressing adhesive. Family History: Patient family history includes Alzheimer's disease in her mother; Arthritis in her brother; Breast cancer in her maternal aunt; Colon cancer in her mother; Colon polyps in her brother; Healthy in her son; Heart disease in her mother; Hypertension in her mother; Kidney failure in her mother; Suicidality in her brother. Social History:  Patient  reports that she quit smoking about 5 years ago. Her smoking use included cigarettes. She started smoking about 39 years ago. She has a 11.2 pack-year smoking history. She has been exposed to tobacco smoke. She has never used smokeless tobacco. She reports current alcohol use. She reports that she does not use drugs.  Review of Systems: Constitutional: Negative for fever malaise or anorexia Cardiovascular: negative for chest pain Respiratory: negative for SOB or persistent cough Gastrointestinal: negative for abdominal pain  Objective  Vitals: BP 123/79   Pulse 77   Temp 97.7 F  (36.5 C)   Ht 5\' 6"  (1.676 m)   Wt 172 lb 3.2 oz (78.1 kg)   SpO2 96%   BMI 27.79 kg/m  General: no acute distress , A&Ox3 Skin of forearms is very thin, bruising is present.  No bleeding.  No petechiae.  Lower extremities without petechiae.  1 small bruise on the anterior shin present.  Commons side effects, risks, benefits, and alternatives for medications and treatment plan prescribed today were discussed, and the patient expressed understanding of the given instructions. Patient is instructed to call or message via MyChart if he/she has any questions or concerns regarding our treatment plan. No barriers to understanding were identified. We discussed Red Flag symptoms and signs in detail. Patient expressed understanding regarding what to do in case of urgent or emergency type symptoms.  Medication list was reconciled, printed and provided to the patient in AVS. Patient instructions and summary information was reviewed with the patient as documented in the AVS. This note was prepared with assistance of Dragon voice recognition software. Occasional wrong-word or sound-a-like substitutions may have occurred due to the inherent limitations of voice recognition software

## 2023-04-08 NOTE — Patient Instructions (Signed)
 Please follow up as scheduled for your next visit with me: 06/08/2023   If you have any questions or concerns, please don't hesitate to send me a message via MyChart or call the office at [phone]. Thank you for visiting with April [name] today! It's our pleasure caring for you.   VISIT SUMMARY:  Today, we discussed your concerns about easy bruising and frequent falls. You shared that you have been experiencing easy bruising, particularly on your extremities, and that you recently had a fall which resulted in torn skin on your arms. You also mentioned your recent diagnosis of Parkinson's disease and your frustration with frequent falls. We reviewed your history of significant steroid use and its impact on your skin fragility. Additionally, you mentioned a couple of episodes of nosebleeds this winter, which you attributed to dry heat. Your blood counts, including platelets and hemoglobin, are normal.  YOUR PLAN:  -PARKINSON'S DISEASE: Parkinson's disease is a disorder of the nervous system that affects movement, often causing tremors and balance issues. You have been referred to a neurologist who specializes in Parkinson's disease for further management of your symptoms.  -EASY BRUISING DUE TO STEROID DERMOPATHY: Easy bruising is often caused by fragile skin, which can result from long-term use of steroids like prednisone. We discussed the effects of long-term steroid use on your skin and advised you on protective measures to prevent falls and skin injuries. If needed, we may consider checking your blood's ability to clot in the future.  INSTRUCTIONS:  Please follow up with the neurologist specializing in Parkinson's disease as soon as possible for further management of your symptoms. Continue to take protective measures to prevent falls and skin injuries. If you experience any new or worsening symptoms, please contact our office.

## 2023-04-13 ENCOUNTER — Ambulatory Visit (HOSPITAL_BASED_OUTPATIENT_CLINIC_OR_DEPARTMENT_OTHER)
Admission: RE | Admit: 2023-04-13 | Discharge: 2023-04-13 | Disposition: A | Discharge status: Home / Self Care | Source: Ambulatory Visit | Attending: Family Medicine | Admitting: Family Medicine

## 2023-04-13 DIAGNOSIS — M810 Age-related osteoporosis without current pathological fracture: Secondary | ICD-10-CM | POA: Insufficient documentation

## 2023-04-13 DIAGNOSIS — M8589 Other specified disorders of bone density and structure, multiple sites: Secondary | ICD-10-CM | POA: Diagnosis not present

## 2023-04-13 DIAGNOSIS — Z780 Asymptomatic menopausal state: Secondary | ICD-10-CM | POA: Diagnosis not present

## 2023-04-15 ENCOUNTER — Telehealth (HOSPITAL_BASED_OUTPATIENT_CLINIC_OR_DEPARTMENT_OTHER): Payer: Self-pay

## 2023-04-15 NOTE — Telephone Encounter (Signed)
 Patient is unable to afford the [facility] Please advise   Copied from CRM [id number]. Topic: Clinical - Prescription Issue >> Apr 15, 2023  2:50 PM Para March B wrote: Reason for CRM: Patient saw Ames Dura 3/25. The prescription (nebulizer meds)  that she prescribed is too expensive so she needs to discuss alternative.s

## 2023-04-16 ENCOUNTER — Ambulatory Visit (HOSPITAL_BASED_OUTPATIENT_CLINIC_OR_DEPARTMENT_OTHER): Payer: Self-pay | Admitting: Primary Care

## 2023-04-16 NOTE — Telephone Encounter (Signed)
 E2C2 Pulmonary Triage - Initial Assessment Questions "Chief Complaint (e.g., cough, sob, wheezing, fever, chills, sweat or additional symptoms) *Go to specific symptom protocol after initial questions. Patient calling with increased wheezing since 03/30/2023 visit. Constant through out the day but will get worse at different times of day. Patient states she is short of breath at baseline and that she has had worse. Patient is thinking the wheezing could be allergy related for this time of year. Attempts have been made to get patient a different nebulizer but cost has been a problem. Patient will need follow up from the office about the nebulizer order.   Per protocol, patient is recommended to Urgent care for increased wheezing. Patient verbalized understanding of plan and all questions answered.   "How long have symptoms been present?" Wheezing has increased since 03/30/2023 visit  Have you tested for COVID or Flu? Note: If not, ask patient if a home test can be taken. If so, instruct patient to call back for positive results. No  MEDICINES:   "Have you used any OTC meds to help with symptoms?" No If yes, ask "What medications?"   "Have you used your inhalers/maintenance medication?" Yes If yes, "What medications?" Breztri Aerosphere  Albuterol  If inhaler, ask "How many puffs and how often?" Note: Review instructions on medication in the chart. Breztri Aerosphere 2 puff BID Albuterol 2 puff every six hours PRN OXYGEN: "Do you wear supplemental oxygen?" No If yes, "How many liters are you supposed to use?"   "Do you monitor your oxygen levels?" Yes If yes, "What is your reading (oxygen level) today?" 92%  "What is your usual oxygen saturation reading?"  (Note: Pulmonary O2 sats should be 90% or greater) 94-97%   Copied from CRM [id number]. Topic: Clinical - Red Word Triage >> Apr 16, 2023  3:18 PM Konrad Dolores wrote: Red Word that prompted transfer to Nurse Triage: Wheezing worse than  when she saw provider Clent Ridges last. Reason for Disposition  [1] MODERATE longstanding difficulty breathing (e.g., speaks in phrases, SOB even at rest, pulse 100-120) AND [2] SAME as normal  Answer Assessment - Initial Assessment Questions 1. RESPIRATORY STATUS: "Describe your breathing?" (e.g., wheezing, shortness of breath, unable to speak, severe coughing)      Wheezing and shortness of breath 4. SEVERITY: "How bad is your breathing?" (e.g., mild, moderate, severe)    - MILD: No SOB at rest, mild SOB with walking, speaks normally in sentences, can lie down, no retractions, pulse < 100.    - MODERATE: SOB at rest, SOB with minimal exertion and prefers to sit, cannot lie down flat, speaks in phrases, mild retractions, audible wheezing, pulse 100-120.    - SEVERE: Very SOB at rest, speaks in single words, struggling to breathe, sitting hunched forward, retractions, pulse > 120      Mild to Moderate 5. RECURRENT SYMPTOM: "Have you had difficulty breathing before?" If Yes, ask: "When was the last time?" and "What happened that time?"      yes 6. CARDIAC HISTORY: "Do you have any history of heart disease?" (e.g., heart attack, angina, bypass surgery, angioplasty)      HTN 7. LUNG HISTORY: "Do you have any history of lung disease?"  (e.g., pulmonary embolus, asthma, emphysema)     COPD 8. CAUSE: "What do you think is causing the breathing problem?"      unsure 9. OTHER SYMPTOMS: "Do you have any other symptoms? (e.g., dizziness, runny nose, cough, chest pain, fever)  cough  Protocols used: Breathing Difficulty-A-AH

## 2023-04-16 NOTE — Telephone Encounter (Addendum)
 Patient needs to contact [employer] for pre-screening for patient assistance. It is not a prior authorization issue. There is a requirement that patients have paid $2000 out of pocket for prescription and medical expenses for calendar year 2025 before being approved. If patient knows she has not done this, unfortunately may not qualify for patient assistance but can try  Phone#: [phone] Fax#: [phone]

## 2023-04-16 NOTE — Telephone Encounter (Signed)
 Can we initiate a PA on this or do we have Jacqualine Mau contact with [employer] to help with getting medication approved

## 2023-04-16 NOTE — Telephone Encounter (Signed)
 Can you please relay message from pharmacist and provide them contact for [facility]

## 2023-04-19 ENCOUNTER — Telehealth: Payer: Self-pay

## 2023-04-19 NOTE — Telephone Encounter (Signed)
Routing to Beth as an FYI. 

## 2023-04-19 NOTE — Telephone Encounter (Signed)
 There is OOP requirement that must be met : $2000 for medical and pharmacy. If she hasn't paid this, unlikely to qualify unfortunately. We do not have a workaround for this at this time. Patient can consider roflumilast but high risk for GI side effects and cost can be rate-limiting step as well for Medicare patients  Geraldene Kleine, PharmD, MPH, BCPS, CPP Clinical Pharmacist (Rheumatology and Pulmonology)

## 2023-04-19 NOTE — Telephone Encounter (Signed)
 Pt is aware of below message and voiced her understanding.  She would like to hold off for now and discuss further at upcoming appt 08/03/2023

## 2023-04-19 NOTE — Progress Notes (Unsigned)
 Assessment/Plan:   Parkinsonism, likely atypical state  - Patient presents today with right foot dystonia, pseudobulbar laughter, pseudobulbar speech, square wave jerks, diplopia and near syncopal episodes.  MSA is certainly in the differential diagnosis.  I do not think PSP can absolutely be ruled out.   -SCA-2/3 could also be possibilities, although she does not report a family history of such.  Abnormal DaTscan has been reported in patients who have SCA with dystonia/parkinsonism, especially in those who have SCA-2.  SCA-17 can present with dystonia and parkinsonism, but this is generally seen in Asian populations and clinical picture does not completely fit this. - I think we should go ahead and proceed with skin biopsy to see if she is positive for alpha-synuclein or if this is perhaps a tau state.  If positive, I think we can narrow it down to MSA.  If negative, then we know that this either is PSP or, less likely, one of the SCA's.   Subjective:   April [name] was seen today in the movement disorders clinic for neurologic consultation at the request of Loleta Chance, Manus Gunning, MD.  The consultation is for the evaluation of frequent falls and possible parkinsonism.  Medical records made available to me are reviewed.  I spoke with Dr. Loleta Chance before and after the case.  Patient started seeing Dr. Loleta Chance December, 2023 and noted that symptoms started 5 to 6 years prior to that.  Symptoms started with vertigo and falls.  At that visit, Dr. Loleta Chance noted increased tone in all extremities.  Neuroimaging was ordered.  MRI brain was performed January 15, 2022 and was normal.  MRI cervical spine was done that same day and demonstrated moderate central canal stenosis at C5-C6 and C6-C7 and multilevel neuroforaminal stenosis.  Patient subsequently saw Dr. Danielle Dess and he ordered an MRI thoracic spine which was fairly unremarkable.  EMG was done February 10, 2022 and was normal.  She subsequently had fairly extensive lab  work and CSF testing which was unremarkable.  She followed back up December, 2024 and she was started on low-dose levodopa at half tablet 3 times per day, which was increased to 1 tablet 3 times per day at the very end of January 2025.  DaTscan was done April 06, 2023 and demonstrated decreased radiotracer uptake in the bilateral putamen and head of the right caudate.  I personally reviewed those images.  Pt held her carbidopa/levodopa for todays visit.  She stopped it 24 hours ago.   Specific Symptoms:  Tremor: No. Family hx of similar:  No., and no fam hx of neurodegen dx Voice: "its changed a lot.  I'm hoarse all of the time." Sleep: trouble staying asleep  Vivid Dreams:  No.  Acting out dreams:  she fell OOB one time only 3 years ago Wet Pillows: Yes.   Postural symptoms:  Yes.    Falls?  Yes.  , all falls are to the left; the R foot turns inward x few years.   Bradykinesia symptoms: slow movements, drooling while awake, difficulty getting out of a chair, and difficulty regaining balance; she does have hx of PMR but sees rheum and told doing well Loss of smell:  Yes.   Loss of taste:  No. Urinary Incontinence:  Yes.   X years (had bladder mesh 15 years ago and needs revision) - wears pads Difficulty Swallowing:  Yes.  , gets choked "on my spit"; occ trouble with food but mostly with her own secretions Handwriting, micrographia:  Yes.   Trouble with ADL's:  Yes.    Trouble buttoning clothing: trouble with small buttons Depression:  No., but "I laugh all of the time" (even if not funny) Memory changes:  No. Hallucinations:  No.  visual distortions: No. N/V:  No. Lightheaded:  "all the time"  Syncope: No. Diplopia:  No., horizontal - doesn't know if monocular or binocular Prior exposure to reglan/antipsychotics: No.    ALLERGIES:   Allergies  Allergen Reactions   Sulfonamide Derivatives     As a child.  Throat closed, rash.   Alendronate Sodium     GI upset   Dilaudid  [Hydromorphone Hcl] Nausea And Vomiting   Sulfa Antibiotics Hives   Other Other (See Comments)    Adhesive    Wound Dressing Adhesive Other (See Comments)    Redness, adhesive tapes. Needs PAPER TAPE.    CURRENT MEDICATIONS:  Current Meds  Medication Sig   acetaminophen (TYLENOL) 500 MG tablet Take 1,000 mg by mouth every 6 (six) hours as needed for moderate pain or headache.   albuterol (VENTOLIN HFA) 108 (90 Base) MCG/ACT inhaler Inhale 2 puffs into the lungs every 6 hours as needed for wheezing or shortness of breath.   amoxicillin (AMOXIL) 500 MG capsule Take 2,000 mg by mouth once. Take before a dental appt   azithromycin (ZITHROMAX) 250 MG tablet Take 1 tablet (250 mg total) by mouth every Monday, Wednesday, and Friday.   budeson-glycopyrrolate-formoterol (BREZTRI AEROSPHERE) 160-9-4.8 MCG/ACT AERO Inhale 2 puffs into the lungs every 12 (twelve) hours.   Calcium 500 MG CHEW Chew 500 mg by mouth 2 (two) times daily.   carbidopa-levodopa (SINEMET IR) 25-100 MG tablet Take 1 tablet by mouth 3 (three) times daily.   Cholecalciferol 250 MCG (10000 UT) CAPS Take 500 capsules by mouth daily.   Cyanocobalamin (VITAMIN B 12 PO) Take by mouth.   furosemide (LASIX) 20 MG tablet Take 1 tablet (20 mg total) by mouth daily as needed for fluid.   ibandronate (BONIVA) 150 MG tablet TAKE 1 TABLET BY MOUTH EACH MONTH WITH 8oz OF plain WATER 60 mins BEFORE first food, drink, OR meds. stay upright FOR 60 MINUTES.   levothyroxine (SYNTHROID) 100 MCG tablet Take 1 tablet (100 mcg total) by mouth daily.   metoprolol succinate (TOPROL-XL) 25 MG 24 hr tablet Take 1 tablet (25 mg total) by mouth daily.   omeprazole (PRILOSEC) 20 MG capsule TAKE 1 CAPSULE BY MOUTH EVERY DAY   Polyethyl Glycol-Propyl Glycol (SYSTANE OP) Place 1 drop into both eyes as needed.   potassium chloride (KLOR-CON M) 10 MEQ tablet Take 1 tablet (10 mEq total) by mouth daily as needed (when you take the lasix).   rosuvastatin (CRESTOR)  10 MG tablet Take 1 tablet (10 mg total) by mouth at bedtime.   Spacer/Aero-Holding Chambers (AEROCHAMBER MV) inhaler Use as instructed   tiZANidine (ZANAFLEX) 4 MG tablet TAKE 1/2 TO 1 TABLET BY MOUTH EVERY 6 HOURS AS NEEDED FOR MUSCLE SPASMS   valsartan (DIOVAN) 40 MG tablet Take 1 tablet (40 mg total) by mouth daily.   vitamin C (ASCORBIC ACID) 250 MG tablet Take 500 mg by mouth daily.   [DISCONTINUED] buPROPion (WELLBUTRIN XL) 300 MG 24 hr tablet TAKE 1 TABLET BY MOUTH EVERY DAY     Objective:   VITALS:   Vitals:   04/21/23 0956  BP: 124/76  Pulse: 81  SpO2: 99%  Weight: 175 lb 6.4 oz (79.6 kg)   Orthostatic VS for the past 72  hrs (Last 3 readings):  Orthostatic BP Patient Position BP Location Orthostatic Pulse  04/21/23 1111 114/60 Standing Left Arm 110  04/21/23 1110 116/65 Sitting Left Arm 73  04/21/23 1109 110/60 Supine Left Arm 72    GEN:  The patient appears stated age and is in NAD. HEENT:  Normocephalic, atraumatic.  The mucous membranes are moist. The superficial temporal arteries are without ropiness or tenderness. CV:  RRR Lungs:  CTAB Neck/HEME:  There are no carotid bruits bilaterally.  Neurological examination:  Orientation: The patient is alert and oriented x3.  Cranial nerves: There is good facial symmetry. Extraocular muscles are intact (upgaze also intact). There are square wave jerks.  She has trouble with smooth pursuit.  The visual fields are full to confrontational testing. The speech is fluent and clear. She has pseudobulbar speech.  The patient has trouble with making the gutteral sounds difficulty.  There is some pseudobulbar laughter (pointed out by the patient before the examiner).  Soft palate rises symmetrically and there is no tongue deviation. Hearing is intact to conversational tone. Sensation: Sensation is intact to light and pinprick throughout (facial, trunk, extremities). Vibration is intact at the bilateral big toe. There is no extinction  with double simultaneous stimulation. There is no sensory dermatomal level identified. Motor: Strength is 5/5 in the bilateral upper and lower extremities.   Shoulder shrug is equal and symmetric.  There is no pronator drift. Deep tendon reflexes: Deep tendon reflexes are 2+-3-/4 at the bilateral biceps, triceps, brachioradialis, patella and achilles. Plantar responses are downgoing bilaterally.  Movement examination: Tone: There is normal tone in the bilateral upper extremities.  The tone in the lower extremities is normal.  Abnormal movements: none Coordination:  There is  decremation with RAM's, mostly with finger and toe taps on the right Gait and Station: The patient pushes off to arise.  She grabs her walker.  She is just slightly forward flexed.  The right foot/ankle is inverted.  When the walker is taken away, she is still narrow-based.  She is tenuous and careful with the gait and holds onto the examiner.  The foot remains inverted on the right.   I have reviewed and interpreted the following labs independently   Chemistry      Component Value Date/Time   NA 139 12/08/2022 1039   NA 138 01/31/2021 1029   K 3.9 12/08/2022 1039   CL 106 12/08/2022 1039   CO2 28 12/08/2022 1039   BUN 21 12/08/2022 1039   BUN 15 01/31/2021 1029   CREATININE 1.18 12/08/2022 1039   CREATININE 1.13 (H) 06/03/2016 1213      Component Value Date/Time   CALCIUM 9.2 12/08/2022 1039   ALKPHOS 63 12/08/2022 1039   AST 16 12/08/2022 1039   ALT 13 12/08/2022 1039   BILITOT 0.5 12/08/2022 1039   BILITOT 0.4 05/20/2021 1154      Lab Results  Component Value Date   TSH 1.45 12/08/2022   Lab Results  Component Value Date   WBC 7.0 12/08/2022   HGB 12.6 12/08/2022   HCT 38.5 12/08/2022   MCV 94.7 12/08/2022   PLT 179.0 12/08/2022   Lab Results  Component Value Date   ESRSEDRATE 73 (H) 10/28/2017     Total time spent on today's visit was 70 minutes, including both face-to-face time and  nonface-to-face time.  Time included that spent on review of records (prior notes available to me/labs/imaging if pertinent), discussing treatment and goals, answering patient's questions and coordinating  care.  Cc:  Luevenia Saha, MD

## 2023-04-19 NOTE — Telephone Encounter (Signed)
 Copied from CRM [id number]. Topic: Clinical - Prescription Issue >> Apr 16, 2023  3:19 PM Tyronne Galloway wrote: Reason for CRM: Sanye called back stating she talked to [employer] [facility] already twice and they told her the copay for the Piedad Brewer is going ot be out of pocket cost of $592 which she cannot afford. Patient is seeking an alternative medication if possible.   Pharmacy team, please advise if there is any assistance for this medication. Thanks

## 2023-04-19 NOTE — Telephone Encounter (Signed)
 I can send in prednisone taper if needed, recently saw her and wrote that in my plan Had tried to get Airsupra and Ohtuvayre  but I don't believe either were affordable

## 2023-04-19 NOTE — Telephone Encounter (Signed)
 Lm for patient.

## 2023-04-19 NOTE — Telephone Encounter (Signed)
 Patient is aware of below message/recommendations.  She states  that she did not present to UC, as the wheezing improved yesterday.  She would like prednisone. She also stated that she could not afford [place] co pay, she would like a Rx for albuterol HFA.  Beth, please advise and send in prednisone. Thank you.

## 2023-04-21 ENCOUNTER — Ambulatory Visit: Admitting: Neurology

## 2023-04-21 ENCOUNTER — Telehealth (HOSPITAL_BASED_OUTPATIENT_CLINIC_OR_DEPARTMENT_OTHER): Payer: Self-pay

## 2023-04-21 ENCOUNTER — Encounter (HOSPITAL_BASED_OUTPATIENT_CLINIC_OR_DEPARTMENT_OTHER): Payer: Self-pay

## 2023-04-21 ENCOUNTER — Encounter: Payer: Self-pay | Admitting: Neurology

## 2023-04-21 VITALS — Wt 175.4 lb

## 2023-04-21 DIAGNOSIS — G20C Parkinsonism, unspecified: Secondary | ICD-10-CM | POA: Diagnosis not present

## 2023-04-21 MED ORDER — ALBUTEROL SULFATE HFA 108 (90 BASE) MCG/ACT IN AERS
2.0000 | INHALATION_SPRAY | Freq: Four times a day (QID) | RESPIRATORY_TRACT | 6 refills | Status: DC | PRN
Start: 2023-04-21 — End: 2023-07-23

## 2023-04-21 MED ORDER — PREDNISONE 10 MG PO TABS
ORAL_TABLET | ORAL | 0 refills | Status: DC
Start: 2023-04-21 — End: 2023-07-28

## 2023-04-21 NOTE — Addendum Note (Signed)
 Addended by: Antonio Baumgarten on: 04/21/2023 02:37 PM   Modules accepted: Orders

## 2023-04-21 NOTE — Telephone Encounter (Signed)
 I sent in prednisone taper and albuterol per patients request

## 2023-04-21 NOTE — Telephone Encounter (Signed)
 Copied from CRM [id number]. Topic: Clinical - Prescription Issue >> Apr 21, 2023  1:51 PM Roseanne Cones wrote: Patient reached out to [employer] as advised by [employer], she still has another $1200 before she meets her OOP - Patient would like to know if there's an alternative medication or if Eulas Hick would consider prescribing presidone until the next appointment - Please call patient when you have a moment.

## 2023-04-21 NOTE — Telephone Encounter (Signed)
Pt notified on VM. 

## 2023-04-21 NOTE — Telephone Encounter (Signed)
 I sent in prednisone and Albuterol

## 2023-04-21 NOTE — Telephone Encounter (Signed)
 ATC X1. LMTCB. Sending a Mychart message for this

## 2023-04-22 NOTE — Telephone Encounter (Signed)
 Patient needs letter from [facility]

## 2023-04-22 NOTE — Telephone Encounter (Signed)
 I dont know how to send to her mychart

## 2023-04-27 ENCOUNTER — Ambulatory Visit: Payer: Medicare Other | Admitting: Neurology

## 2023-05-07 ENCOUNTER — Telehealth: Payer: Self-pay | Admitting: Neurology

## 2023-05-07 NOTE — Telephone Encounter (Signed)
 Lmom to have patient call back to schedule a skin Biopsy with Dr Tat ( 60 mins)

## 2023-05-10 ENCOUNTER — Other Ambulatory Visit: Payer: Self-pay

## 2023-05-10 ENCOUNTER — Emergency Department (HOSPITAL_BASED_OUTPATIENT_CLINIC_OR_DEPARTMENT_OTHER)

## 2023-05-10 ENCOUNTER — Encounter (HOSPITAL_BASED_OUTPATIENT_CLINIC_OR_DEPARTMENT_OTHER): Payer: Self-pay | Admitting: Emergency Medicine

## 2023-05-10 ENCOUNTER — Emergency Department (HOSPITAL_BASED_OUTPATIENT_CLINIC_OR_DEPARTMENT_OTHER)
Admission: EM | Admit: 2023-05-10 | Discharge: 2023-05-11 | Disposition: A | Discharge status: Home / Self Care | Attending: Emergency Medicine | Admitting: Emergency Medicine

## 2023-05-10 DIAGNOSIS — M533 Sacrococcygeal disorders, not elsewhere classified: Secondary | ICD-10-CM | POA: Insufficient documentation

## 2023-05-10 DIAGNOSIS — M25552 Pain in left hip: Secondary | ICD-10-CM | POA: Diagnosis not present

## 2023-05-10 DIAGNOSIS — W19XXXA Unspecified fall, initial encounter: Secondary | ICD-10-CM | POA: Insufficient documentation

## 2023-05-10 DIAGNOSIS — M25572 Pain in left ankle and joints of left foot: Secondary | ICD-10-CM | POA: Diagnosis not present

## 2023-05-10 DIAGNOSIS — M545 Low back pain, unspecified: Secondary | ICD-10-CM | POA: Insufficient documentation

## 2023-05-10 DIAGNOSIS — S82832A Other fracture of upper and lower end of left fibula, initial encounter for closed fracture: Secondary | ICD-10-CM | POA: Insufficient documentation

## 2023-05-10 DIAGNOSIS — Z96643 Presence of artificial hip joint, bilateral: Secondary | ICD-10-CM | POA: Diagnosis not present

## 2023-05-10 DIAGNOSIS — I10 Essential (primary) hypertension: Secondary | ICD-10-CM | POA: Diagnosis not present

## 2023-05-10 DIAGNOSIS — R000 Tachycardia, unspecified: Secondary | ICD-10-CM | POA: Diagnosis not present

## 2023-05-10 DIAGNOSIS — S99911A Unspecified injury of right ankle, initial encounter: Secondary | ICD-10-CM | POA: Diagnosis not present

## 2023-05-10 DIAGNOSIS — S99912A Unspecified injury of left ankle, initial encounter: Secondary | ICD-10-CM | POA: Diagnosis present

## 2023-05-10 DIAGNOSIS — M47816 Spondylosis without myelopathy or radiculopathy, lumbar region: Secondary | ICD-10-CM | POA: Diagnosis not present

## 2023-05-10 NOTE — ED Triage Notes (Addendum)
 Pt BIBA from [facility]- EMS reports mechanical fall, from bed to floor. Landed on L elbow, L ankle.  C/o L ankle pain.   Pt reports numbness in L foot after fall. Denies head injury, LOC

## 2023-05-11 ENCOUNTER — Emergency Department (HOSPITAL_BASED_OUTPATIENT_CLINIC_OR_DEPARTMENT_OTHER)

## 2023-05-11 DIAGNOSIS — Z96643 Presence of artificial hip joint, bilateral: Secondary | ICD-10-CM | POA: Diagnosis not present

## 2023-05-11 DIAGNOSIS — M47816 Spondylosis without myelopathy or radiculopathy, lumbar region: Secondary | ICD-10-CM | POA: Diagnosis not present

## 2023-05-11 DIAGNOSIS — M545 Low back pain, unspecified: Secondary | ICD-10-CM | POA: Diagnosis not present

## 2023-05-11 DIAGNOSIS — Z7401 Bed confinement status: Secondary | ICD-10-CM | POA: Diagnosis not present

## 2023-05-11 DIAGNOSIS — S82302A Unspecified fracture of lower end of left tibia, initial encounter for closed fracture: Secondary | ICD-10-CM | POA: Diagnosis not present

## 2023-05-11 DIAGNOSIS — Z743 Need for continuous supervision: Secondary | ICD-10-CM | POA: Diagnosis not present

## 2023-05-11 DIAGNOSIS — I499 Cardiac arrhythmia, unspecified: Secondary | ICD-10-CM | POA: Diagnosis not present

## 2023-05-11 DIAGNOSIS — M25552 Pain in left hip: Secondary | ICD-10-CM | POA: Diagnosis not present

## 2023-05-11 DIAGNOSIS — S86999A Other injury of unspecified muscle(s) and tendon(s) at lower leg level, unspecified leg, initial encounter: Secondary | ICD-10-CM | POA: Diagnosis not present

## 2023-05-11 MED ORDER — HYDROCODONE-ACETAMINOPHEN 5-325 MG PO TABS
0.5000 | ORAL_TABLET | Freq: Once | ORAL | Status: AC
  Administered 2023-05-11: 0.5 via ORAL
  Filled 2023-05-11: qty 1

## 2023-05-11 MED ORDER — HYDROCODONE-ACETAMINOPHEN 5-325 MG PO TABS: 0.5000 | ORAL_TABLET | Freq: Four times a day (QID) | ORAL | 0 refills | Status: DC | PRN

## 2023-05-11 NOTE — ED Notes (Signed)
 Patient transported to X-ray

## 2023-05-11 NOTE — ED Provider Notes (Signed)
 [place] EMERGENCY DEPARTMENT AT [facility] [place]  Provider Note  CSN: [id number] Arrival date & time: 05/10/23 2240  History Chief Complaint  Patient presents with  . Fall    April [name] is a 68 y.o. female with history of balance issues, uses a walker at baseline, reports she was getting ready for bed when she lost her balance and fell, injuring her L ankle. She denies head injury. Brought to the ED via EMS. She reports some lower back pain as well.    Home Medications Prior to Admission medications   Medication Sig Start Date End Date Taking? Authorizing Provider  albuterol  (VENTOLIN  HFA) 108 (90 Base) MCG/ACT inhaler Inhale 2 puffs into the lungs every 6 (six) hours as needed for wheezing or shortness of breath. 04/21/23   Antonio Baumgarten, NP  HYDROcodone -acetaminophen  (NORCO/VICODIN) 5-325 MG tablet Take 0.5-1 tablets by mouth every 6 (six) hours as needed for severe pain (pain score 7-10). 05/11/23  Yes Charmayne Cooper, MD  acetaminophen  (TYLENOL ) 500 MG tablet Take 1,000 mg by mouth every 6 (six) hours as needed for moderate pain or headache.    [provider]  albuterol  (VENTOLIN  HFA) 108 (90 Base) MCG/ACT inhaler Inhale 2 puffs into the lungs every 6 hours as needed for wheezing or shortness of breath. 02/17/23   Cobb, Mariah Shines, NP  amoxicillin  (AMOXIL ) 500 MG capsule Take 2,000 mg by mouth once. Take before a dental appt    [provider]  azithromycin  (ZITHROMAX ) 250 MG tablet Take 1 tablet (250 mg total) by mouth every Monday, Wednesday, and Friday. 03/03/23 02/02/24  Antonio Baumgarten, NP  budeson-glycopyrrolate -formoterol  (BREZTRI  AEROSPHERE) 160-9-4.8 MCG/ACT AERO Inhale 2 puffs into the lungs every 12 (twelve) hours. 03/30/23   Antonio Baumgarten, NP  Calcium  500 MG CHEW Chew 500 mg by mouth 2 (two) times daily.    [provider]  carbidopa -levodopa  (SINEMET  IR) 25-100 MG tablet Take 1 tablet by mouth 3 (three) times daily.  02/03/23   Ellene Gustin, MD  Cholecalciferol  250 MCG (10000 UT) CAPS Take 500 capsules by mouth daily. 10/24/18   [provider]  Cyanocobalamin  (VITAMIN B 12 PO) Take by mouth.    [provider]  furosemide  (LASIX ) 20 MG tablet Take 1 tablet (20 mg total) by mouth daily as needed for fluid. 03/01/23   Sheryle Donning, MD  ibandronate  (BONIVA ) 150 MG tablet TAKE 1 TABLET BY MOUTH EACH MONTH WITH 8oz OF plain WATER 60 mins BEFORE first food, drink, OR meds. stay upright FOR 60 MINUTES. 08/25/22   Webb, Padonda B, FNP  levothyroxine  (SYNTHROID ) 100 MCG tablet Take 1 tablet (100 mcg total) by mouth daily. 03/10/23   Luevenia Saha, MD  metoprolol  succinate (TOPROL -XL) 25 MG 24 hr tablet Take 1 tablet (25 mg total) by mouth daily. 03/01/23   Sheryle Donning, MD  omeprazole  (PRILOSEC) 20 MG capsule TAKE 1 CAPSULE BY MOUTH EVERY DAY 01/20/23   Luevenia Saha, MD  Polyethyl Glycol-Propyl Glycol (SYSTANE OP) Place 1 drop into both eyes as needed.    [provider]  potassium chloride  (KLOR-CON  M) 10 MEQ tablet Take 1 tablet (10 mEq total) by mouth daily as needed (when you take the lasix ). 03/01/23   Sheryle Donning, MD  predniSONE  (DELTASONE ) 10 MG tablet 4 tabs for 2 days, then 3 tabs for 2 days, 2 tabs for 2 days, then 1 tab for 2 days, then stop 04/21/23   Antonio Baumgarten,  NP  rosuvastatin  (CRESTOR ) 10 MG tablet Take 1 tablet (10 mg total) by mouth at bedtime. 03/01/23   Sheryle Donning, MD  Spacer/Aero-Holding Chambers (AEROCHAMBER MV) inhaler Use as instructed 02/06/19   Antonio Baumgarten, NP  tiZANidine  (ZANAFLEX ) 4 MG tablet TAKE 1/2 TO 1 TABLET BY MOUTH EVERY 6 HOURS AS NEEDED FOR MUSCLE SPASMS 09/16/22   Luevenia Saha, MD  valsartan  (DIOVAN ) 40 MG tablet Take 1 tablet (40 mg total) by mouth daily. 03/01/23   Sheryle Donning, MD  vitamin C (ASCORBIC ACID) 250 MG tablet Take 500 mg by mouth daily.    [provider]      Allergies    Sulfonamide derivatives, Alendronate sodium, Dilaudid  [hydromorphone  hcl], Sulfa antibiotics, Other, and Wound dressing adhesive   Review of Systems   Review of Systems Please see HPI for pertinent positives and negatives  Physical Exam BP (!) 140/85   Pulse (!) 102   Temp 98.8 F (37.1 C)   Resp 15   Ht 5\' 6"  (1.676 m)   Wt 77.1 kg   SpO2 97%   BMI 27.44 kg/m   Physical Exam Vitals and nursing note reviewed.  Constitutional:      Appearance: Normal appearance.  HENT:     Head: Normocephalic and atraumatic.     Nose: Nose normal.     Mouth/Throat:     Mouth: Mucous membranes are moist.  Eyes:     Extraocular Movements: Extraocular movements intact.     Conjunctiva/sclera: Conjunctivae normal.  Cardiovascular:     Rate and Rhythm: Normal rate.     Pulses: Normal pulses.  Pulmonary:     Effort: Pulmonary effort is normal.     Breath sounds: Normal breath sounds.  Abdominal:     General: Abdomen is flat.     Palpations: Abdomen is soft.     Tenderness: There is no abdominal tenderness.  Musculoskeletal:        General: Tenderness (L lateral ankle; midline lower back/sacrum) present. No swelling or deformity. Normal range of motion.     Cervical back: Neck supple.  Skin:    General: Skin is warm and dry.  Neurological:     General: No focal deficit present.     Mental Status: She is alert.  Psychiatric:        Mood and Affect: Mood normal.     ED Results / Procedures / Treatments   EKG None  Procedures Procedures  Medications Ordered in the ED Medications  HYDROcodone -acetaminophen  (NORCO/VICODIN) 5-325 MG per tablet 0.5 tablet (has no administration in time range)    Initial Impression and Plan  Patient here with mechanical fall and L ankle injury. I personally viewed the images from radiology studies and agree with radiologist interpretation: Xray concerning for nondisplaced distal fibula fracture. She also has some midline lower  back/sacrum pain, but declines xrays of same. Will give cam boot, Rx for norco, advised to use sparingly to avoid further gait disturbance and not to mix with APAP. Ortho follow up, RTED for any other concerns.  ED Course   Clinical Course as of 05/11/23 0126  Tue May 11, 2023  0034 Prior to discharge, patient has changed her mind and would like xrays of back/sacrum/pelvis.  [CS]  0126 I personally viewed the images from radiology studies and agree with radiologist interpretation: Additional xrays neg for fracture. Dispo as above.  [CS]    Clinical Course User Index [CS] Charmayne Cooper, MD  MDM Rules/Calculators/A&P Medical Decision Making Problems Addressed: Closed fracture of distal end of left fibula, unspecified fracture morphology, initial encounter: acute illness or injury Fall, initial encounter: acute illness or injury  Amount and/or Complexity of Data Reviewed Radiology: ordered and independent interpretation performed. Decision-making details documented in ED Course.  Risk Prescription drug management.     Final Clinical Impression(s) / ED Diagnoses Final diagnoses:  Fall, initial encounter  Closed fracture of distal end of left fibula, unspecified fracture morphology, initial encounter    Rx / DC Orders ED Discharge Orders          Ordered    HYDROcodone -acetaminophen  (NORCO/VICODIN) 5-325 MG tablet  Every 6 hours PRN        05/11/23 0013             Charmayne Cooper, MD 05/11/23 [phone]

## 2023-05-12 ENCOUNTER — Telehealth: Payer: Self-pay | Admitting: Neurology

## 2023-05-12 NOTE — Telephone Encounter (Signed)
 Attempted to return patient's call. Left message asking for call back as I am trying to clarify her symptoms as I do not recall her having tremor in the past.  Rommie Coats, MD [facility] Neurology

## 2023-05-12 NOTE — Telephone Encounter (Signed)
 Pt fell and broke left ankle and has been having tremors and needs advice

## 2023-05-14 DIAGNOSIS — S82832A Other fracture of upper and lower end of left fibula, initial encounter for closed fracture: Secondary | ICD-10-CM | POA: Diagnosis not present

## 2023-05-25 DIAGNOSIS — M62552 Muscle wasting and atrophy, not elsewhere classified, left thigh: Secondary | ICD-10-CM | POA: Diagnosis not present

## 2023-05-25 DIAGNOSIS — R2689 Other abnormalities of gait and mobility: Secondary | ICD-10-CM | POA: Diagnosis not present

## 2023-05-25 DIAGNOSIS — R278 Other lack of coordination: Secondary | ICD-10-CM | POA: Diagnosis not present

## 2023-05-25 DIAGNOSIS — M62551 Muscle wasting and atrophy, not elsewhere classified, right thigh: Secondary | ICD-10-CM | POA: Diagnosis not present

## 2023-05-25 DIAGNOSIS — R296 Repeated falls: Secondary | ICD-10-CM | POA: Diagnosis not present

## 2023-05-26 ENCOUNTER — Telehealth: Payer: Self-pay

## 2023-05-26 DIAGNOSIS — M62522 Muscle wasting and atrophy, not elsewhere classified, left upper arm: Secondary | ICD-10-CM | POA: Diagnosis not present

## 2023-05-26 DIAGNOSIS — M62521 Muscle wasting and atrophy, not elsewhere classified, right upper arm: Secondary | ICD-10-CM | POA: Diagnosis not present

## 2023-05-26 DIAGNOSIS — R296 Repeated falls: Secondary | ICD-10-CM | POA: Diagnosis not present

## 2023-05-26 DIAGNOSIS — M62551 Muscle wasting and atrophy, not elsewhere classified, right thigh: Secondary | ICD-10-CM | POA: Diagnosis not present

## 2023-05-26 DIAGNOSIS — M62552 Muscle wasting and atrophy, not elsewhere classified, left thigh: Secondary | ICD-10-CM | POA: Diagnosis not present

## 2023-05-26 DIAGNOSIS — R2681 Unsteadiness on feet: Secondary | ICD-10-CM | POA: Diagnosis not present

## 2023-05-26 DIAGNOSIS — R278 Other lack of coordination: Secondary | ICD-10-CM | POA: Diagnosis not present

## 2023-05-26 DIAGNOSIS — R2689 Other abnormalities of gait and mobility: Secondary | ICD-10-CM | POA: Diagnosis not present

## 2023-05-26 NOTE — Telephone Encounter (Signed)
 Copied from CRM [id number]. Topic: Clinical - Medical Advice >> May 26, 2023 11:41 AM Carlatta H wrote: Reason for CRM: Patients Friend Burdette Carolin is trying to get information about getting the patient into a assisted living facility//Please call [phone]  Please Advise

## 2023-05-27 ENCOUNTER — Ambulatory Visit: Payer: Self-pay | Admitting: Family Medicine

## 2023-05-27 DIAGNOSIS — R278 Other lack of coordination: Secondary | ICD-10-CM | POA: Diagnosis not present

## 2023-05-27 DIAGNOSIS — M62521 Muscle wasting and atrophy, not elsewhere classified, right upper arm: Secondary | ICD-10-CM | POA: Diagnosis not present

## 2023-05-27 DIAGNOSIS — R2681 Unsteadiness on feet: Secondary | ICD-10-CM | POA: Diagnosis not present

## 2023-05-27 DIAGNOSIS — M62522 Muscle wasting and atrophy, not elsewhere classified, left upper arm: Secondary | ICD-10-CM | POA: Diagnosis not present

## 2023-05-27 DIAGNOSIS — R296 Repeated falls: Secondary | ICD-10-CM | POA: Diagnosis not present

## 2023-05-27 NOTE — Progress Notes (Signed)
 See mychart note DEXA 04/2023: lowest T = -2.3 right forearm. Hips excluded due to replacement. Osteopenia. Recheck 2 years

## 2023-05-28 ENCOUNTER — Telehealth: Payer: Self-pay

## 2023-05-28 NOTE — Telephone Encounter (Signed)
 Copied from CRM [id number]. Topic: Clinical - Medication Question >> May 27, 2023 12:29 PM Juleen [place] F wrote: Reason for CRM: Patient wants to know if she should still be taking Boniva  medication or should she be taking something else? Please call patient with an update at [phone] (M)  Please see pt msg and advise

## 2023-06-01 ENCOUNTER — Other Ambulatory Visit: Admitting: Neurology

## 2023-06-01 DIAGNOSIS — R278 Other lack of coordination: Secondary | ICD-10-CM | POA: Diagnosis not present

## 2023-06-01 DIAGNOSIS — M25572 Pain in left ankle and joints of left foot: Secondary | ICD-10-CM | POA: Diagnosis not present

## 2023-06-01 DIAGNOSIS — R2681 Unsteadiness on feet: Secondary | ICD-10-CM | POA: Diagnosis not present

## 2023-06-01 DIAGNOSIS — R296 Repeated falls: Secondary | ICD-10-CM | POA: Diagnosis not present

## 2023-06-01 DIAGNOSIS — M62521 Muscle wasting and atrophy, not elsewhere classified, right upper arm: Secondary | ICD-10-CM | POA: Diagnosis not present

## 2023-06-01 DIAGNOSIS — M62522 Muscle wasting and atrophy, not elsewhere classified, left upper arm: Secondary | ICD-10-CM | POA: Diagnosis not present

## 2023-06-02 DIAGNOSIS — R2689 Other abnormalities of gait and mobility: Secondary | ICD-10-CM | POA: Diagnosis not present

## 2023-06-02 DIAGNOSIS — R2681 Unsteadiness on feet: Secondary | ICD-10-CM | POA: Diagnosis not present

## 2023-06-02 DIAGNOSIS — M62551 Muscle wasting and atrophy, not elsewhere classified, right thigh: Secondary | ICD-10-CM | POA: Diagnosis not present

## 2023-06-02 DIAGNOSIS — M62521 Muscle wasting and atrophy, not elsewhere classified, right upper arm: Secondary | ICD-10-CM | POA: Diagnosis not present

## 2023-06-02 DIAGNOSIS — R278 Other lack of coordination: Secondary | ICD-10-CM | POA: Diagnosis not present

## 2023-06-02 DIAGNOSIS — M62522 Muscle wasting and atrophy, not elsewhere classified, left upper arm: Secondary | ICD-10-CM | POA: Diagnosis not present

## 2023-06-02 DIAGNOSIS — R296 Repeated falls: Secondary | ICD-10-CM | POA: Diagnosis not present

## 2023-06-02 DIAGNOSIS — M62552 Muscle wasting and atrophy, not elsewhere classified, left thigh: Secondary | ICD-10-CM | POA: Diagnosis not present

## 2023-06-03 DIAGNOSIS — M62551 Muscle wasting and atrophy, not elsewhere classified, right thigh: Secondary | ICD-10-CM | POA: Diagnosis not present

## 2023-06-03 DIAGNOSIS — R296 Repeated falls: Secondary | ICD-10-CM | POA: Diagnosis not present

## 2023-06-03 DIAGNOSIS — R2689 Other abnormalities of gait and mobility: Secondary | ICD-10-CM | POA: Diagnosis not present

## 2023-06-03 DIAGNOSIS — R278 Other lack of coordination: Secondary | ICD-10-CM | POA: Diagnosis not present

## 2023-06-03 DIAGNOSIS — M62552 Muscle wasting and atrophy, not elsewhere classified, left thigh: Secondary | ICD-10-CM | POA: Diagnosis not present

## 2023-06-04 DIAGNOSIS — M62521 Muscle wasting and atrophy, not elsewhere classified, right upper arm: Secondary | ICD-10-CM | POA: Diagnosis not present

## 2023-06-04 DIAGNOSIS — R278 Other lack of coordination: Secondary | ICD-10-CM | POA: Diagnosis not present

## 2023-06-04 DIAGNOSIS — M62522 Muscle wasting and atrophy, not elsewhere classified, left upper arm: Secondary | ICD-10-CM | POA: Diagnosis not present

## 2023-06-04 DIAGNOSIS — R2681 Unsteadiness on feet: Secondary | ICD-10-CM | POA: Diagnosis not present

## 2023-06-04 DIAGNOSIS — R296 Repeated falls: Secondary | ICD-10-CM | POA: Diagnosis not present

## 2023-06-07 ENCOUNTER — Other Ambulatory Visit: Payer: Self-pay | Admitting: Family Medicine

## 2023-06-07 DIAGNOSIS — R296 Repeated falls: Secondary | ICD-10-CM | POA: Diagnosis not present

## 2023-06-07 DIAGNOSIS — M62521 Muscle wasting and atrophy, not elsewhere classified, right upper arm: Secondary | ICD-10-CM | POA: Diagnosis not present

## 2023-06-07 DIAGNOSIS — R2681 Unsteadiness on feet: Secondary | ICD-10-CM | POA: Diagnosis not present

## 2023-06-07 DIAGNOSIS — R278 Other lack of coordination: Secondary | ICD-10-CM | POA: Diagnosis not present

## 2023-06-07 DIAGNOSIS — M62522 Muscle wasting and atrophy, not elsewhere classified, left upper arm: Secondary | ICD-10-CM | POA: Diagnosis not present

## 2023-06-08 ENCOUNTER — Ambulatory Visit: Admitting: Family Medicine

## 2023-06-08 ENCOUNTER — Ambulatory Visit: Payer: Medicare Other | Admitting: Family Medicine

## 2023-06-08 ENCOUNTER — Ambulatory Visit (INDEPENDENT_AMBULATORY_CARE_PROVIDER_SITE_OTHER): Admitting: Neurology

## 2023-06-08 DIAGNOSIS — M62551 Muscle wasting and atrophy, not elsewhere classified, right thigh: Secondary | ICD-10-CM | POA: Diagnosis not present

## 2023-06-08 DIAGNOSIS — M62572 Muscle wasting and atrophy, not elsewhere classified, left ankle and foot: Secondary | ICD-10-CM | POA: Diagnosis not present

## 2023-06-08 DIAGNOSIS — M25572 Pain in left ankle and joints of left foot: Secondary | ICD-10-CM | POA: Diagnosis not present

## 2023-06-08 DIAGNOSIS — R278 Other lack of coordination: Secondary | ICD-10-CM | POA: Diagnosis not present

## 2023-06-08 DIAGNOSIS — R2689 Other abnormalities of gait and mobility: Secondary | ICD-10-CM | POA: Diagnosis not present

## 2023-06-08 DIAGNOSIS — G20C Parkinsonism, unspecified: Secondary | ICD-10-CM

## 2023-06-08 DIAGNOSIS — M62552 Muscle wasting and atrophy, not elsewhere classified, left thigh: Secondary | ICD-10-CM | POA: Diagnosis not present

## 2023-06-08 DIAGNOSIS — R296 Repeated falls: Secondary | ICD-10-CM | POA: Diagnosis not present

## 2023-06-08 NOTE — Patient Instructions (Signed)
 INFORMATION FOR PATIENTS AFTER SKIN BIOPSY:   What You Need to Do:  1. Keep your current (large) bandage on for 24 hours and do not shower during this time. Leave today's bandage on until AFTER your next shower tomorrow.  Change your bandages every day starting tomorrow after that shower. Keep the bandages on while you shower. Change them once a day until a scab forms.  2. You can let the small steristrips fall off naturally and do not need to ever take them off.  They will eventually fall off on their own (after showering)  3. You may take showers after 24 hours, but DO NOT take tub baths, go in hot tubs or go swimming for seven days after the procedure.  4. You may use vasoline, bacitracin, or Polysporin ointment on the wounds as needed.  5. If a scab forms at the biopsy site, leave it alone.  6. If bleeding occurs, apply firm pressure for two minutes with a clean piece of gauze.   Please contact us  immediately if there is any redness, any signs of infection, or significant bleeding at the biopsy site.   Please call our office at [phone].

## 2023-06-08 NOTE — Procedures (Signed)
 Punch Biopsy Procedure Note  Preprocedure Diagnosis: Bradykinesia; Tremor;   Postprocedure Diagnosis: same  Locations: Site 1: R posterior cervical Site 2: above, right knee;  Site 3: above, right foot  Indications: r/o alpha synucleinopathy  Anesthesia: 3 mL Lidocaine  1% with epinephrine  without added sodium bicarbonate  Procedure Details Patient informed of the risks (including but not limited to bleeding, pain, infection, scar and infection) and benefits of the procedure.  Informed consent obtained.  The areas which were chosen for biopsy, as above, and surrounding areas were given a sterile prep using betadyne and draped in the usual sterile fashion. The skin was then stretched perpendicular to the skin tension lines and sample removed using the 3 mm punch. Of note is the fact that the patient had VERY thin skin and samples were taken but were limited at the knee and ankle.  Pressure applied, hemostasis achieved.   Dressing applied. The specimen(s) was sent for pathologic examination. The patient tolerated the procedure well.  Estimated Blood Loss: 0 ml  Condition: Stable  Complications: none.  Plan: 1. Instructed to keep the wound dry and covered for 24-48h and clean thereafter. 2. Warning signs of infection were reviewed.

## 2023-06-09 DIAGNOSIS — R296 Repeated falls: Secondary | ICD-10-CM | POA: Diagnosis not present

## 2023-06-09 DIAGNOSIS — M62521 Muscle wasting and atrophy, not elsewhere classified, right upper arm: Secondary | ICD-10-CM | POA: Diagnosis not present

## 2023-06-09 DIAGNOSIS — R2681 Unsteadiness on feet: Secondary | ICD-10-CM | POA: Diagnosis not present

## 2023-06-09 DIAGNOSIS — M62522 Muscle wasting and atrophy, not elsewhere classified, left upper arm: Secondary | ICD-10-CM | POA: Diagnosis not present

## 2023-06-09 DIAGNOSIS — R278 Other lack of coordination: Secondary | ICD-10-CM | POA: Diagnosis not present

## 2023-06-09 DIAGNOSIS — M62572 Muscle wasting and atrophy, not elsewhere classified, left ankle and foot: Secondary | ICD-10-CM | POA: Diagnosis not present

## 2023-06-09 DIAGNOSIS — M62552 Muscle wasting and atrophy, not elsewhere classified, left thigh: Secondary | ICD-10-CM | POA: Diagnosis not present

## 2023-06-09 DIAGNOSIS — M62551 Muscle wasting and atrophy, not elsewhere classified, right thigh: Secondary | ICD-10-CM | POA: Diagnosis not present

## 2023-06-10 ENCOUNTER — Ambulatory Visit: Admitting: Family Medicine

## 2023-06-10 DIAGNOSIS — M62521 Muscle wasting and atrophy, not elsewhere classified, right upper arm: Secondary | ICD-10-CM | POA: Diagnosis not present

## 2023-06-10 DIAGNOSIS — R296 Repeated falls: Secondary | ICD-10-CM | POA: Diagnosis not present

## 2023-06-10 DIAGNOSIS — R278 Other lack of coordination: Secondary | ICD-10-CM | POA: Diagnosis not present

## 2023-06-10 DIAGNOSIS — M62522 Muscle wasting and atrophy, not elsewhere classified, left upper arm: Secondary | ICD-10-CM | POA: Diagnosis not present

## 2023-06-10 DIAGNOSIS — R2681 Unsteadiness on feet: Secondary | ICD-10-CM | POA: Diagnosis not present

## 2023-06-11 DIAGNOSIS — R278 Other lack of coordination: Secondary | ICD-10-CM | POA: Diagnosis not present

## 2023-06-11 DIAGNOSIS — M62551 Muscle wasting and atrophy, not elsewhere classified, right thigh: Secondary | ICD-10-CM | POA: Diagnosis not present

## 2023-06-11 DIAGNOSIS — R296 Repeated falls: Secondary | ICD-10-CM | POA: Diagnosis not present

## 2023-06-11 DIAGNOSIS — M62552 Muscle wasting and atrophy, not elsewhere classified, left thigh: Secondary | ICD-10-CM | POA: Diagnosis not present

## 2023-06-11 DIAGNOSIS — M62572 Muscle wasting and atrophy, not elsewhere classified, left ankle and foot: Secondary | ICD-10-CM | POA: Diagnosis not present

## 2023-06-14 DIAGNOSIS — R2681 Unsteadiness on feet: Secondary | ICD-10-CM | POA: Diagnosis not present

## 2023-06-14 DIAGNOSIS — R278 Other lack of coordination: Secondary | ICD-10-CM | POA: Diagnosis not present

## 2023-06-14 DIAGNOSIS — R296 Repeated falls: Secondary | ICD-10-CM | POA: Diagnosis not present

## 2023-06-14 DIAGNOSIS — M62522 Muscle wasting and atrophy, not elsewhere classified, left upper arm: Secondary | ICD-10-CM | POA: Diagnosis not present

## 2023-06-14 DIAGNOSIS — M62521 Muscle wasting and atrophy, not elsewhere classified, right upper arm: Secondary | ICD-10-CM | POA: Diagnosis not present

## 2023-06-15 DIAGNOSIS — M62552 Muscle wasting and atrophy, not elsewhere classified, left thigh: Secondary | ICD-10-CM | POA: Diagnosis not present

## 2023-06-15 DIAGNOSIS — M62572 Muscle wasting and atrophy, not elsewhere classified, left ankle and foot: Secondary | ICD-10-CM | POA: Diagnosis not present

## 2023-06-15 DIAGNOSIS — R278 Other lack of coordination: Secondary | ICD-10-CM | POA: Diagnosis not present

## 2023-06-15 DIAGNOSIS — R296 Repeated falls: Secondary | ICD-10-CM | POA: Diagnosis not present

## 2023-06-15 DIAGNOSIS — M62551 Muscle wasting and atrophy, not elsewhere classified, right thigh: Secondary | ICD-10-CM | POA: Diagnosis not present

## 2023-06-16 DIAGNOSIS — R296 Repeated falls: Secondary | ICD-10-CM | POA: Diagnosis not present

## 2023-06-16 DIAGNOSIS — R2681 Unsteadiness on feet: Secondary | ICD-10-CM | POA: Diagnosis not present

## 2023-06-16 DIAGNOSIS — M62551 Muscle wasting and atrophy, not elsewhere classified, right thigh: Secondary | ICD-10-CM | POA: Diagnosis not present

## 2023-06-16 DIAGNOSIS — M62521 Muscle wasting and atrophy, not elsewhere classified, right upper arm: Secondary | ICD-10-CM | POA: Diagnosis not present

## 2023-06-16 DIAGNOSIS — R278 Other lack of coordination: Secondary | ICD-10-CM | POA: Diagnosis not present

## 2023-06-16 DIAGNOSIS — M62552 Muscle wasting and atrophy, not elsewhere classified, left thigh: Secondary | ICD-10-CM | POA: Diagnosis not present

## 2023-06-16 DIAGNOSIS — M62522 Muscle wasting and atrophy, not elsewhere classified, left upper arm: Secondary | ICD-10-CM | POA: Diagnosis not present

## 2023-06-16 DIAGNOSIS — M62572 Muscle wasting and atrophy, not elsewhere classified, left ankle and foot: Secondary | ICD-10-CM | POA: Diagnosis not present

## 2023-06-17 DIAGNOSIS — M62521 Muscle wasting and atrophy, not elsewhere classified, right upper arm: Secondary | ICD-10-CM | POA: Diagnosis not present

## 2023-06-17 DIAGNOSIS — R278 Other lack of coordination: Secondary | ICD-10-CM | POA: Diagnosis not present

## 2023-06-17 DIAGNOSIS — R2681 Unsteadiness on feet: Secondary | ICD-10-CM | POA: Diagnosis not present

## 2023-06-17 DIAGNOSIS — M62522 Muscle wasting and atrophy, not elsewhere classified, left upper arm: Secondary | ICD-10-CM | POA: Diagnosis not present

## 2023-06-17 DIAGNOSIS — R296 Repeated falls: Secondary | ICD-10-CM | POA: Diagnosis not present

## 2023-06-18 DIAGNOSIS — M62551 Muscle wasting and atrophy, not elsewhere classified, right thigh: Secondary | ICD-10-CM | POA: Diagnosis not present

## 2023-06-18 DIAGNOSIS — M62552 Muscle wasting and atrophy, not elsewhere classified, left thigh: Secondary | ICD-10-CM | POA: Diagnosis not present

## 2023-06-18 DIAGNOSIS — M62572 Muscle wasting and atrophy, not elsewhere classified, left ankle and foot: Secondary | ICD-10-CM | POA: Diagnosis not present

## 2023-06-18 DIAGNOSIS — R296 Repeated falls: Secondary | ICD-10-CM | POA: Diagnosis not present

## 2023-06-18 DIAGNOSIS — R278 Other lack of coordination: Secondary | ICD-10-CM | POA: Diagnosis not present

## 2023-06-21 DIAGNOSIS — M62522 Muscle wasting and atrophy, not elsewhere classified, left upper arm: Secondary | ICD-10-CM | POA: Diagnosis not present

## 2023-06-21 DIAGNOSIS — M62521 Muscle wasting and atrophy, not elsewhere classified, right upper arm: Secondary | ICD-10-CM | POA: Diagnosis not present

## 2023-06-21 DIAGNOSIS — R278 Other lack of coordination: Secondary | ICD-10-CM | POA: Diagnosis not present

## 2023-06-21 DIAGNOSIS — R296 Repeated falls: Secondary | ICD-10-CM | POA: Diagnosis not present

## 2023-06-21 DIAGNOSIS — R2681 Unsteadiness on feet: Secondary | ICD-10-CM | POA: Diagnosis not present

## 2023-06-22 DIAGNOSIS — M62551 Muscle wasting and atrophy, not elsewhere classified, right thigh: Secondary | ICD-10-CM | POA: Diagnosis not present

## 2023-06-22 DIAGNOSIS — M62572 Muscle wasting and atrophy, not elsewhere classified, left ankle and foot: Secondary | ICD-10-CM | POA: Diagnosis not present

## 2023-06-22 DIAGNOSIS — M62552 Muscle wasting and atrophy, not elsewhere classified, left thigh: Secondary | ICD-10-CM | POA: Diagnosis not present

## 2023-06-22 DIAGNOSIS — R296 Repeated falls: Secondary | ICD-10-CM | POA: Diagnosis not present

## 2023-06-22 DIAGNOSIS — R278 Other lack of coordination: Secondary | ICD-10-CM | POA: Diagnosis not present

## 2023-06-23 DIAGNOSIS — M62572 Muscle wasting and atrophy, not elsewhere classified, left ankle and foot: Secondary | ICD-10-CM | POA: Diagnosis not present

## 2023-06-23 DIAGNOSIS — M62522 Muscle wasting and atrophy, not elsewhere classified, left upper arm: Secondary | ICD-10-CM | POA: Diagnosis not present

## 2023-06-23 DIAGNOSIS — M25572 Pain in left ankle and joints of left foot: Secondary | ICD-10-CM | POA: Diagnosis not present

## 2023-06-23 DIAGNOSIS — R2681 Unsteadiness on feet: Secondary | ICD-10-CM | POA: Diagnosis not present

## 2023-06-23 DIAGNOSIS — M62521 Muscle wasting and atrophy, not elsewhere classified, right upper arm: Secondary | ICD-10-CM | POA: Diagnosis not present

## 2023-06-23 DIAGNOSIS — R296 Repeated falls: Secondary | ICD-10-CM | POA: Diagnosis not present

## 2023-06-23 DIAGNOSIS — R278 Other lack of coordination: Secondary | ICD-10-CM | POA: Diagnosis not present

## 2023-06-23 DIAGNOSIS — M62551 Muscle wasting and atrophy, not elsewhere classified, right thigh: Secondary | ICD-10-CM | POA: Diagnosis not present

## 2023-06-23 DIAGNOSIS — M62552 Muscle wasting and atrophy, not elsewhere classified, left thigh: Secondary | ICD-10-CM | POA: Diagnosis not present

## 2023-06-24 DIAGNOSIS — M62551 Muscle wasting and atrophy, not elsewhere classified, right thigh: Secondary | ICD-10-CM | POA: Diagnosis not present

## 2023-06-24 DIAGNOSIS — R296 Repeated falls: Secondary | ICD-10-CM | POA: Diagnosis not present

## 2023-06-24 DIAGNOSIS — M62521 Muscle wasting and atrophy, not elsewhere classified, right upper arm: Secondary | ICD-10-CM | POA: Diagnosis not present

## 2023-06-24 DIAGNOSIS — M62522 Muscle wasting and atrophy, not elsewhere classified, left upper arm: Secondary | ICD-10-CM | POA: Diagnosis not present

## 2023-06-24 DIAGNOSIS — M62552 Muscle wasting and atrophy, not elsewhere classified, left thigh: Secondary | ICD-10-CM | POA: Diagnosis not present

## 2023-06-24 DIAGNOSIS — R278 Other lack of coordination: Secondary | ICD-10-CM | POA: Diagnosis not present

## 2023-06-24 DIAGNOSIS — R2681 Unsteadiness on feet: Secondary | ICD-10-CM | POA: Diagnosis not present

## 2023-06-24 DIAGNOSIS — M62572 Muscle wasting and atrophy, not elsewhere classified, left ankle and foot: Secondary | ICD-10-CM | POA: Diagnosis not present

## 2023-06-25 DIAGNOSIS — M62572 Muscle wasting and atrophy, not elsewhere classified, left ankle and foot: Secondary | ICD-10-CM | POA: Diagnosis not present

## 2023-06-25 DIAGNOSIS — R051 Acute cough: Secondary | ICD-10-CM | POA: Diagnosis not present

## 2023-06-25 DIAGNOSIS — R296 Repeated falls: Secondary | ICD-10-CM | POA: Diagnosis not present

## 2023-06-25 DIAGNOSIS — Z87891 Personal history of nicotine dependence: Secondary | ICD-10-CM | POA: Diagnosis not present

## 2023-06-25 DIAGNOSIS — J449 Chronic obstructive pulmonary disease, unspecified: Secondary | ICD-10-CM | POA: Diagnosis not present

## 2023-06-25 DIAGNOSIS — R278 Other lack of coordination: Secondary | ICD-10-CM | POA: Diagnosis not present

## 2023-06-25 DIAGNOSIS — M62551 Muscle wasting and atrophy, not elsewhere classified, right thigh: Secondary | ICD-10-CM | POA: Diagnosis not present

## 2023-06-25 DIAGNOSIS — M62552 Muscle wasting and atrophy, not elsewhere classified, left thigh: Secondary | ICD-10-CM | POA: Diagnosis not present

## 2023-06-27 ENCOUNTER — Encounter (HOSPITAL_BASED_OUTPATIENT_CLINIC_OR_DEPARTMENT_OTHER): Payer: Self-pay

## 2023-06-27 ENCOUNTER — Telehealth: Payer: Self-pay | Admitting: Pulmonary Disease

## 2023-06-27 MED ORDER — AMOXICILLIN-POT CLAVULANATE 875-125 MG PO TABS: 1.0000 | ORAL_TABLET | Freq: Two times a day (BID) | ORAL | 0 refills | Status: DC

## 2023-06-27 NOTE — Telephone Encounter (Signed)
 Complains of scratchy throat followed by cough and green sputum. Requesting an antibiotic. Already on azithromycin  MWF Sent in Augmentin  875 twice daily for 7 days

## 2023-06-28 NOTE — Telephone Encounter (Signed)
 Can you help with this?

## 2023-06-29 ENCOUNTER — Ambulatory Visit (HOSPITAL_BASED_OUTPATIENT_CLINIC_OR_DEPARTMENT_OTHER): Admitting: Primary Care

## 2023-06-30 DIAGNOSIS — M62552 Muscle wasting and atrophy, not elsewhere classified, left thigh: Secondary | ICD-10-CM | POA: Diagnosis not present

## 2023-06-30 DIAGNOSIS — M62551 Muscle wasting and atrophy, not elsewhere classified, right thigh: Secondary | ICD-10-CM | POA: Diagnosis not present

## 2023-06-30 DIAGNOSIS — M62572 Muscle wasting and atrophy, not elsewhere classified, left ankle and foot: Secondary | ICD-10-CM | POA: Diagnosis not present

## 2023-06-30 DIAGNOSIS — I10 Essential (primary) hypertension: Secondary | ICD-10-CM | POA: Diagnosis not present

## 2023-06-30 DIAGNOSIS — M62522 Muscle wasting and atrophy, not elsewhere classified, left upper arm: Secondary | ICD-10-CM | POA: Diagnosis not present

## 2023-06-30 DIAGNOSIS — J449 Chronic obstructive pulmonary disease, unspecified: Secondary | ICD-10-CM | POA: Diagnosis not present

## 2023-06-30 DIAGNOSIS — R2681 Unsteadiness on feet: Secondary | ICD-10-CM | POA: Diagnosis not present

## 2023-06-30 DIAGNOSIS — R278 Other lack of coordination: Secondary | ICD-10-CM | POA: Diagnosis not present

## 2023-06-30 DIAGNOSIS — M62521 Muscle wasting and atrophy, not elsewhere classified, right upper arm: Secondary | ICD-10-CM | POA: Diagnosis not present

## 2023-06-30 DIAGNOSIS — R296 Repeated falls: Secondary | ICD-10-CM | POA: Diagnosis not present

## 2023-07-01 ENCOUNTER — Telehealth: Payer: Self-pay | Admitting: Neurology

## 2023-07-01 DIAGNOSIS — M62522 Muscle wasting and atrophy, not elsewhere classified, left upper arm: Secondary | ICD-10-CM | POA: Diagnosis not present

## 2023-07-01 DIAGNOSIS — M62572 Muscle wasting and atrophy, not elsewhere classified, left ankle and foot: Secondary | ICD-10-CM | POA: Diagnosis not present

## 2023-07-01 DIAGNOSIS — M62552 Muscle wasting and atrophy, not elsewhere classified, left thigh: Secondary | ICD-10-CM | POA: Diagnosis not present

## 2023-07-01 DIAGNOSIS — M62551 Muscle wasting and atrophy, not elsewhere classified, right thigh: Secondary | ICD-10-CM | POA: Diagnosis not present

## 2023-07-01 DIAGNOSIS — R296 Repeated falls: Secondary | ICD-10-CM | POA: Diagnosis not present

## 2023-07-01 DIAGNOSIS — R278 Other lack of coordination: Secondary | ICD-10-CM | POA: Diagnosis not present

## 2023-07-01 DIAGNOSIS — R2681 Unsteadiness on feet: Secondary | ICD-10-CM | POA: Diagnosis not present

## 2023-07-01 DIAGNOSIS — M62521 Muscle wasting and atrophy, not elsewhere classified, right upper arm: Secondary | ICD-10-CM | POA: Diagnosis not present

## 2023-07-01 NOTE — Telephone Encounter (Signed)
 Pts skin biopsy came back.  There was:  No evidence of alpha synuclein in the cutaneous nerves 2.  Was evidence of small fiber neuropathy 3.  No evidence of amyloid deposition within the cutaneous nerves  Please see if you can put the patient in on video on Monday to discuss next steps.  Its okay if she wants to come in but she doesn't have to given walking trouble.  Okay for the 2:30 slot

## 2023-07-02 NOTE — Progress Notes (Unsigned)
 Virtual Visit Via Video       Consent was obtained for video visit:  Yes.   Answered questions that patient had about telehealth interaction:  Yes.   I discussed the limitations, risks, security and privacy concerns of performing an evaluation and management service by telemedicine. I also discussed with the patient that there may be a patient responsible charge related to this service. The patient expressed understanding and agreed to proceed.  Pt location: Home Physician Location: office Name of referring provider:  Jodie Lavern CROME, MD I connected with April [name] at patients initiation/request on 07/05/2023 at  9:45 AM EDT by video enabled telemedicine application and verified that I am speaking with the correct person using two identifiers. Pt MRN:  [id number] Pt DOB:  September 04, 1955 Video Participants:  April [name];    Assessment/Plan:   Parkinsonism, likely atypical state  - Patient presents today with right foot dystonia, pseudobulbar laughter, pseudobulbar speech, square wave jerks, diplopia and near syncopal episodes.     -Skin biopsy was negative for alpha-synuclein.  -This makes the diagnosis of PSP much more likely, although SCA-2/3 could also be possibilities.  She, however, does not report a family history of such.  Abnormal DaTscan  has been reported in patients who have SCA with dystonia/parkinsonism, especially in those who have SCA-2.  SCA-17 can present with dystonia and parkinsonism, but this is generally seen in Asian populations and clinical picture does not completely fit this.  Discussed with her that we can go ahead and do the genetic testing for SCA, and if negative, I think that we can fairly confidently say that this is PSP.  If, however, the testing is cost prohibitive for the SCA testing, I think its okay to forgo that and conclude that she likely has PSP.  She and I actually did talk a little bit about the future and the need for support in the future.  She  asks about her current living situation, which is independent living and we talked about the need for higher level care in the future.  We also talked about cost considerations for this.  We talked about the [facility] organization as well as the support that our local social worker can provide.  I plan to bring her back for an in person visit after the above testing has been completed.  -I spoke to Dr. Leigh about the patient personally  -email sent to our social worker about the patient to coordinate future visits.   Subjective:   April [name] was seen today in follow-up for testing results.  Patient had a skin biopsy done recently for alpha-synuclein.  There was no evidence of alpha-synuclein in her cutaneous nerves.  There was evidence of a small fiber neuropathy.  There is no evidence of amyloid deposition in her cutaneous nerves.    ALLERGIES:   Allergies  Allergen Reactions   Sulfonamide Derivatives     As a child.  Throat closed, rash.   Alendronate Sodium     GI upset   Dilaudid  [Hydromorphone  Hcl] Nausea And Vomiting   Sulfa Antibiotics Hives   Other Other (See Comments)    Adhesive    Wound Dressing Adhesive Other (See Comments)    Redness, adhesive tapes. Needs PAPER TAPE.    CURRENT MEDICATIONS:  No outpatient medications have been marked as taking for the 07/05/23 encounter (Appointment) with Marlyne Totaro, [place] RAMAN, DO.     Objective:   VITALS:   There were no vitals filed  for this visit.  No data found. Virtually 100% of the visit was in counseling today.  GEN:  The patient appears stated age and is in NAD.  Neurological examination:  Orientation: The patient is alert and oriented x3. Cranial nerves: There is good facial symmetry. There is no significant facial hypomimia.  The speech is fluent and clear.  I have reviewed and interpreted the following labs independently   Chemistry      Component Value Date/Time   NA 139 12/08/2022 1039   NA 138 01/31/2021 1029    K 3.9 12/08/2022 1039   CL 106 12/08/2022 1039   CO2 28 12/08/2022 1039   BUN 21 12/08/2022 1039   BUN 15 01/31/2021 1029   CREATININE 1.18 12/08/2022 1039   CREATININE 1.13 (H) 06/03/2016 1213      Component Value Date/Time   CALCIUM  9.2 12/08/2022 1039   ALKPHOS 63 12/08/2022 1039   AST 16 12/08/2022 1039   ALT 13 12/08/2022 1039   BILITOT 0.5 12/08/2022 1039   BILITOT 0.4 05/20/2021 1154      Lab Results  Component Value Date   TSH 1.45 12/08/2022   Lab Results  Component Value Date   WBC 7.0 12/08/2022   HGB 12.6 12/08/2022   HCT 38.5 12/08/2022   MCV 94.7 12/08/2022   PLT 179.0 12/08/2022   Lab Results  Component Value Date   ESRSEDRATE 73 (H) 10/28/2017     Follow up Instructions      -I discussed the assessment and treatment plan with the patient. The patient was provided an opportunity to ask questions and all were answered. The patient agreed with the plan and demonstrated an understanding of the instructions.   The patient was advised to call back or seek an in-person evaluation if the symptoms worsen or if the condition fails to improve as anticipated.    Total time spent on today's visit was 40 minutes, including both face-to-face time and nonface-to-face time.  Time included that spent on review of records (prior notes available to me/labs/imaging if pertinent), discussing treatment and goals, answering patient's questions and coordinating care.   Khyra Viscuso, DO.  Cc:  Jodie Lavern CROME, MD

## 2023-07-05 ENCOUNTER — Telehealth: Admitting: Neurology

## 2023-07-05 ENCOUNTER — Encounter: Payer: Self-pay | Admitting: Neurology

## 2023-07-05 DIAGNOSIS — G231 Progressive supranuclear ophthalmoplegia [Steele-Richardson-Olszewski]: Secondary | ICD-10-CM

## 2023-07-05 DIAGNOSIS — R2681 Unsteadiness on feet: Secondary | ICD-10-CM | POA: Diagnosis not present

## 2023-07-05 DIAGNOSIS — M62552 Muscle wasting and atrophy, not elsewhere classified, left thigh: Secondary | ICD-10-CM | POA: Diagnosis not present

## 2023-07-05 DIAGNOSIS — R296 Repeated falls: Secondary | ICD-10-CM | POA: Diagnosis not present

## 2023-07-05 DIAGNOSIS — M62572 Muscle wasting and atrophy, not elsewhere classified, left ankle and foot: Secondary | ICD-10-CM | POA: Diagnosis not present

## 2023-07-05 DIAGNOSIS — R2689 Other abnormalities of gait and mobility: Secondary | ICD-10-CM | POA: Diagnosis not present

## 2023-07-05 DIAGNOSIS — M25572 Pain in left ankle and joints of left foot: Secondary | ICD-10-CM | POA: Diagnosis not present

## 2023-07-05 DIAGNOSIS — M62522 Muscle wasting and atrophy, not elsewhere classified, left upper arm: Secondary | ICD-10-CM | POA: Diagnosis not present

## 2023-07-05 DIAGNOSIS — M62551 Muscle wasting and atrophy, not elsewhere classified, right thigh: Secondary | ICD-10-CM | POA: Diagnosis not present

## 2023-07-05 DIAGNOSIS — R278 Other lack of coordination: Secondary | ICD-10-CM | POA: Diagnosis not present

## 2023-07-05 DIAGNOSIS — M62521 Muscle wasting and atrophy, not elsewhere classified, right upper arm: Secondary | ICD-10-CM | POA: Diagnosis not present

## 2023-07-05 NOTE — Addendum Note (Signed)
 Addended by: EVONNIE STABS S on: 07/05/2023 12:32 PM   Modules accepted: Level of Service

## 2023-07-06 DIAGNOSIS — R051 Acute cough: Secondary | ICD-10-CM | POA: Diagnosis not present

## 2023-07-06 DIAGNOSIS — Z7189 Other specified counseling: Secondary | ICD-10-CM | POA: Diagnosis not present

## 2023-07-06 DIAGNOSIS — E782 Mixed hyperlipidemia: Secondary | ICD-10-CM | POA: Diagnosis not present

## 2023-07-06 DIAGNOSIS — I10 Essential (primary) hypertension: Secondary | ICD-10-CM | POA: Diagnosis not present

## 2023-07-06 DIAGNOSIS — E039 Hypothyroidism, unspecified: Secondary | ICD-10-CM | POA: Diagnosis not present

## 2023-07-06 DIAGNOSIS — Z87891 Personal history of nicotine dependence: Secondary | ICD-10-CM | POA: Diagnosis not present

## 2023-07-06 DIAGNOSIS — Z139 Encounter for screening, unspecified: Secondary | ICD-10-CM | POA: Diagnosis not present

## 2023-07-06 DIAGNOSIS — R296 Repeated falls: Secondary | ICD-10-CM | POA: Diagnosis not present

## 2023-07-07 ENCOUNTER — Other Ambulatory Visit (HOSPITAL_BASED_OUTPATIENT_CLINIC_OR_DEPARTMENT_OTHER): Payer: Self-pay | Admitting: Cardiology

## 2023-07-07 DIAGNOSIS — I428 Other cardiomyopathies: Secondary | ICD-10-CM

## 2023-07-07 DIAGNOSIS — R296 Repeated falls: Secondary | ICD-10-CM | POA: Diagnosis not present

## 2023-07-07 DIAGNOSIS — R2681 Unsteadiness on feet: Secondary | ICD-10-CM | POA: Diagnosis not present

## 2023-07-07 DIAGNOSIS — M62522 Muscle wasting and atrophy, not elsewhere classified, left upper arm: Secondary | ICD-10-CM | POA: Diagnosis not present

## 2023-07-07 DIAGNOSIS — I10 Essential (primary) hypertension: Secondary | ICD-10-CM

## 2023-07-07 DIAGNOSIS — M62521 Muscle wasting and atrophy, not elsewhere classified, right upper arm: Secondary | ICD-10-CM | POA: Diagnosis not present

## 2023-07-07 DIAGNOSIS — R278 Other lack of coordination: Secondary | ICD-10-CM | POA: Diagnosis not present

## 2023-07-08 ENCOUNTER — Encounter: Payer: Self-pay | Admitting: Neurology

## 2023-07-08 DIAGNOSIS — R278 Other lack of coordination: Secondary | ICD-10-CM | POA: Diagnosis not present

## 2023-07-08 DIAGNOSIS — M62521 Muscle wasting and atrophy, not elsewhere classified, right upper arm: Secondary | ICD-10-CM | POA: Diagnosis not present

## 2023-07-08 DIAGNOSIS — M25572 Pain in left ankle and joints of left foot: Secondary | ICD-10-CM | POA: Diagnosis not present

## 2023-07-08 DIAGNOSIS — M62572 Muscle wasting and atrophy, not elsewhere classified, left ankle and foot: Secondary | ICD-10-CM | POA: Diagnosis not present

## 2023-07-08 DIAGNOSIS — R296 Repeated falls: Secondary | ICD-10-CM | POA: Diagnosis not present

## 2023-07-08 DIAGNOSIS — R2681 Unsteadiness on feet: Secondary | ICD-10-CM | POA: Diagnosis not present

## 2023-07-08 DIAGNOSIS — R2689 Other abnormalities of gait and mobility: Secondary | ICD-10-CM | POA: Diagnosis not present

## 2023-07-08 DIAGNOSIS — M62522 Muscle wasting and atrophy, not elsewhere classified, left upper arm: Secondary | ICD-10-CM | POA: Diagnosis not present

## 2023-07-08 DIAGNOSIS — M62552 Muscle wasting and atrophy, not elsewhere classified, left thigh: Secondary | ICD-10-CM | POA: Diagnosis not present

## 2023-07-08 DIAGNOSIS — M62551 Muscle wasting and atrophy, not elsewhere classified, right thigh: Secondary | ICD-10-CM | POA: Diagnosis not present

## 2023-07-09 DIAGNOSIS — M62572 Muscle wasting and atrophy, not elsewhere classified, left ankle and foot: Secondary | ICD-10-CM | POA: Diagnosis not present

## 2023-07-09 DIAGNOSIS — R278 Other lack of coordination: Secondary | ICD-10-CM | POA: Diagnosis not present

## 2023-07-09 DIAGNOSIS — M62551 Muscle wasting and atrophy, not elsewhere classified, right thigh: Secondary | ICD-10-CM | POA: Diagnosis not present

## 2023-07-09 DIAGNOSIS — R2689 Other abnormalities of gait and mobility: Secondary | ICD-10-CM | POA: Diagnosis not present

## 2023-07-09 DIAGNOSIS — M62552 Muscle wasting and atrophy, not elsewhere classified, left thigh: Secondary | ICD-10-CM | POA: Diagnosis not present

## 2023-07-09 DIAGNOSIS — R296 Repeated falls: Secondary | ICD-10-CM | POA: Diagnosis not present

## 2023-07-09 DIAGNOSIS — M25572 Pain in left ankle and joints of left foot: Secondary | ICD-10-CM | POA: Diagnosis not present

## 2023-07-12 DIAGNOSIS — R278 Other lack of coordination: Secondary | ICD-10-CM | POA: Diagnosis not present

## 2023-07-12 DIAGNOSIS — M62552 Muscle wasting and atrophy, not elsewhere classified, left thigh: Secondary | ICD-10-CM | POA: Diagnosis not present

## 2023-07-12 DIAGNOSIS — R296 Repeated falls: Secondary | ICD-10-CM | POA: Diagnosis not present

## 2023-07-12 DIAGNOSIS — M25572 Pain in left ankle and joints of left foot: Secondary | ICD-10-CM | POA: Diagnosis not present

## 2023-07-12 DIAGNOSIS — M62572 Muscle wasting and atrophy, not elsewhere classified, left ankle and foot: Secondary | ICD-10-CM | POA: Diagnosis not present

## 2023-07-12 DIAGNOSIS — J069 Acute upper respiratory infection, unspecified: Secondary | ICD-10-CM | POA: Diagnosis not present

## 2023-07-12 DIAGNOSIS — R2681 Unsteadiness on feet: Secondary | ICD-10-CM | POA: Diagnosis not present

## 2023-07-12 DIAGNOSIS — M62551 Muscle wasting and atrophy, not elsewhere classified, right thigh: Secondary | ICD-10-CM | POA: Diagnosis not present

## 2023-07-12 DIAGNOSIS — M62521 Muscle wasting and atrophy, not elsewhere classified, right upper arm: Secondary | ICD-10-CM | POA: Diagnosis not present

## 2023-07-12 DIAGNOSIS — R2689 Other abnormalities of gait and mobility: Secondary | ICD-10-CM | POA: Diagnosis not present

## 2023-07-12 DIAGNOSIS — M62522 Muscle wasting and atrophy, not elsewhere classified, left upper arm: Secondary | ICD-10-CM | POA: Diagnosis not present

## 2023-07-13 DIAGNOSIS — J9809 Other diseases of bronchus, not elsewhere classified: Secondary | ICD-10-CM | POA: Diagnosis not present

## 2023-07-14 DIAGNOSIS — M62552 Muscle wasting and atrophy, not elsewhere classified, left thigh: Secondary | ICD-10-CM | POA: Diagnosis not present

## 2023-07-14 DIAGNOSIS — M62551 Muscle wasting and atrophy, not elsewhere classified, right thigh: Secondary | ICD-10-CM | POA: Diagnosis not present

## 2023-07-14 DIAGNOSIS — R278 Other lack of coordination: Secondary | ICD-10-CM | POA: Diagnosis not present

## 2023-07-14 DIAGNOSIS — M62522 Muscle wasting and atrophy, not elsewhere classified, left upper arm: Secondary | ICD-10-CM | POA: Diagnosis not present

## 2023-07-14 DIAGNOSIS — M62521 Muscle wasting and atrophy, not elsewhere classified, right upper arm: Secondary | ICD-10-CM | POA: Diagnosis not present

## 2023-07-14 DIAGNOSIS — R2681 Unsteadiness on feet: Secondary | ICD-10-CM | POA: Diagnosis not present

## 2023-07-14 DIAGNOSIS — R296 Repeated falls: Secondary | ICD-10-CM | POA: Diagnosis not present

## 2023-07-14 DIAGNOSIS — M62572 Muscle wasting and atrophy, not elsewhere classified, left ankle and foot: Secondary | ICD-10-CM | POA: Diagnosis not present

## 2023-07-14 DIAGNOSIS — M25572 Pain in left ankle and joints of left foot: Secondary | ICD-10-CM | POA: Diagnosis not present

## 2023-07-14 DIAGNOSIS — R2689 Other abnormalities of gait and mobility: Secondary | ICD-10-CM | POA: Diagnosis not present

## 2023-07-15 DIAGNOSIS — R296 Repeated falls: Secondary | ICD-10-CM | POA: Diagnosis not present

## 2023-07-15 DIAGNOSIS — M62552 Muscle wasting and atrophy, not elsewhere classified, left thigh: Secondary | ICD-10-CM | POA: Diagnosis not present

## 2023-07-15 DIAGNOSIS — R2681 Unsteadiness on feet: Secondary | ICD-10-CM | POA: Diagnosis not present

## 2023-07-15 DIAGNOSIS — M62521 Muscle wasting and atrophy, not elsewhere classified, right upper arm: Secondary | ICD-10-CM | POA: Diagnosis not present

## 2023-07-15 DIAGNOSIS — R278 Other lack of coordination: Secondary | ICD-10-CM | POA: Diagnosis not present

## 2023-07-15 DIAGNOSIS — M62572 Muscle wasting and atrophy, not elsewhere classified, left ankle and foot: Secondary | ICD-10-CM | POA: Diagnosis not present

## 2023-07-15 DIAGNOSIS — M62551 Muscle wasting and atrophy, not elsewhere classified, right thigh: Secondary | ICD-10-CM | POA: Diagnosis not present

## 2023-07-15 DIAGNOSIS — M62522 Muscle wasting and atrophy, not elsewhere classified, left upper arm: Secondary | ICD-10-CM | POA: Diagnosis not present

## 2023-07-19 DIAGNOSIS — M62522 Muscle wasting and atrophy, not elsewhere classified, left upper arm: Secondary | ICD-10-CM | POA: Diagnosis not present

## 2023-07-19 DIAGNOSIS — R278 Other lack of coordination: Secondary | ICD-10-CM | POA: Diagnosis not present

## 2023-07-19 DIAGNOSIS — M62521 Muscle wasting and atrophy, not elsewhere classified, right upper arm: Secondary | ICD-10-CM | POA: Diagnosis not present

## 2023-07-19 DIAGNOSIS — R296 Repeated falls: Secondary | ICD-10-CM | POA: Diagnosis not present

## 2023-07-19 DIAGNOSIS — R2681 Unsteadiness on feet: Secondary | ICD-10-CM | POA: Diagnosis not present

## 2023-07-20 ENCOUNTER — Ambulatory Visit: Admitting: Family Medicine

## 2023-07-21 ENCOUNTER — Encounter (HOSPITAL_BASED_OUTPATIENT_CLINIC_OR_DEPARTMENT_OTHER): Payer: Self-pay | Admitting: Emergency Medicine

## 2023-07-21 ENCOUNTER — Emergency Department (HOSPITAL_BASED_OUTPATIENT_CLINIC_OR_DEPARTMENT_OTHER)

## 2023-07-21 ENCOUNTER — Other Ambulatory Visit: Payer: Self-pay

## 2023-07-21 ENCOUNTER — Emergency Department (HOSPITAL_BASED_OUTPATIENT_CLINIC_OR_DEPARTMENT_OTHER)
Admission: EM | Admit: 2023-07-21 | Discharge: 2023-07-21 | Disposition: A | Discharge status: Home / Self Care | Source: Home / Self Care | Attending: Emergency Medicine | Admitting: Emergency Medicine

## 2023-07-21 DIAGNOSIS — J4489 Other specified chronic obstructive pulmonary disease: Secondary | ICD-10-CM | POA: Insufficient documentation

## 2023-07-21 DIAGNOSIS — M533 Sacrococcygeal disorders, not elsewhere classified: Secondary | ICD-10-CM | POA: Diagnosis not present

## 2023-07-21 DIAGNOSIS — W01198A Fall on same level from slipping, tripping and stumbling with subsequent striking against other object, initial encounter: Secondary | ICD-10-CM | POA: Insufficient documentation

## 2023-07-21 DIAGNOSIS — G20C Parkinsonism, unspecified: Secondary | ICD-10-CM | POA: Insufficient documentation

## 2023-07-21 DIAGNOSIS — G20A1 Parkinson's disease without dyskinesia, without mention of fluctuations: Secondary | ICD-10-CM | POA: Diagnosis not present

## 2023-07-21 DIAGNOSIS — J441 Chronic obstructive pulmonary disease with (acute) exacerbation: Secondary | ICD-10-CM | POA: Diagnosis present

## 2023-07-21 DIAGNOSIS — S0083XA Contusion of other part of head, initial encounter: Secondary | ICD-10-CM | POA: Insufficient documentation

## 2023-07-21 DIAGNOSIS — S0990XA Unspecified injury of head, initial encounter: Secondary | ICD-10-CM | POA: Diagnosis not present

## 2023-07-21 DIAGNOSIS — M62521 Muscle wasting and atrophy, not elsewhere classified, right upper arm: Secondary | ICD-10-CM | POA: Diagnosis not present

## 2023-07-21 DIAGNOSIS — D631 Anemia in chronic kidney disease: Secondary | ICD-10-CM | POA: Diagnosis not present

## 2023-07-21 DIAGNOSIS — I2510 Atherosclerotic heart disease of native coronary artery without angina pectoris: Secondary | ICD-10-CM | POA: Diagnosis not present

## 2023-07-21 DIAGNOSIS — R079 Chest pain, unspecified: Secondary | ICD-10-CM | POA: Diagnosis not present

## 2023-07-21 DIAGNOSIS — M62522 Muscle wasting and atrophy, not elsewhere classified, left upper arm: Secondary | ICD-10-CM | POA: Diagnosis not present

## 2023-07-21 DIAGNOSIS — M2578 Osteophyte, vertebrae: Secondary | ICD-10-CM | POA: Diagnosis not present

## 2023-07-21 DIAGNOSIS — W19XXXA Unspecified fall, initial encounter: Secondary | ICD-10-CM

## 2023-07-21 DIAGNOSIS — M542 Cervicalgia: Secondary | ICD-10-CM | POA: Diagnosis not present

## 2023-07-21 DIAGNOSIS — M549 Dorsalgia, unspecified: Secondary | ICD-10-CM | POA: Diagnosis not present

## 2023-07-21 DIAGNOSIS — J9601 Acute respiratory failure with hypoxia: Secondary | ICD-10-CM | POA: Diagnosis not present

## 2023-07-21 DIAGNOSIS — R2681 Unsteadiness on feet: Secondary | ICD-10-CM | POA: Diagnosis not present

## 2023-07-21 DIAGNOSIS — I509 Heart failure, unspecified: Secondary | ICD-10-CM | POA: Diagnosis not present

## 2023-07-21 DIAGNOSIS — I130 Hypertensive heart and chronic kidney disease with heart failure and stage 1 through stage 4 chronic kidney disease, or unspecified chronic kidney disease: Secondary | ICD-10-CM | POA: Insufficient documentation

## 2023-07-21 DIAGNOSIS — Z87891 Personal history of nicotine dependence: Secondary | ICD-10-CM | POA: Diagnosis not present

## 2023-07-21 DIAGNOSIS — M546 Pain in thoracic spine: Secondary | ICD-10-CM | POA: Insufficient documentation

## 2023-07-21 DIAGNOSIS — F17210 Nicotine dependence, cigarettes, uncomplicated: Secondary | ICD-10-CM | POA: Insufficient documentation

## 2023-07-21 DIAGNOSIS — Z96612 Presence of left artificial shoulder joint: Secondary | ICD-10-CM | POA: Diagnosis not present

## 2023-07-21 DIAGNOSIS — I10 Essential (primary) hypertension: Secondary | ICD-10-CM | POA: Diagnosis not present

## 2023-07-21 DIAGNOSIS — R0602 Shortness of breath: Secondary | ICD-10-CM | POA: Diagnosis not present

## 2023-07-21 DIAGNOSIS — Z8249 Family history of ischemic heart disease and other diseases of the circulatory system: Secondary | ICD-10-CM | POA: Diagnosis not present

## 2023-07-21 DIAGNOSIS — Z23 Encounter for immunization: Secondary | ICD-10-CM | POA: Diagnosis not present

## 2023-07-21 DIAGNOSIS — D72829 Elevated white blood cell count, unspecified: Secondary | ICD-10-CM | POA: Diagnosis not present

## 2023-07-21 DIAGNOSIS — E039 Hypothyroidism, unspecified: Secondary | ICD-10-CM | POA: Diagnosis not present

## 2023-07-21 DIAGNOSIS — R7989 Other specified abnormal findings of blood chemistry: Secondary | ICD-10-CM | POA: Diagnosis not present

## 2023-07-21 DIAGNOSIS — J449 Chronic obstructive pulmonary disease, unspecified: Secondary | ICD-10-CM | POA: Diagnosis not present

## 2023-07-21 DIAGNOSIS — M545 Low back pain, unspecified: Secondary | ICD-10-CM | POA: Diagnosis not present

## 2023-07-21 DIAGNOSIS — N189 Chronic kidney disease, unspecified: Secondary | ICD-10-CM | POA: Insufficient documentation

## 2023-07-21 DIAGNOSIS — M353 Polymyalgia rheumatica: Secondary | ICD-10-CM | POA: Diagnosis not present

## 2023-07-21 DIAGNOSIS — R000 Tachycardia, unspecified: Secondary | ICD-10-CM | POA: Diagnosis not present

## 2023-07-21 DIAGNOSIS — M4802 Spinal stenosis, cervical region: Secondary | ICD-10-CM | POA: Diagnosis not present

## 2023-07-21 DIAGNOSIS — Z743 Need for continuous supervision: Secondary | ICD-10-CM | POA: Diagnosis not present

## 2023-07-21 DIAGNOSIS — R278 Other lack of coordination: Secondary | ICD-10-CM | POA: Diagnosis not present

## 2023-07-21 DIAGNOSIS — R9431 Abnormal electrocardiogram [ECG] [EKG]: Secondary | ICD-10-CM | POA: Diagnosis not present

## 2023-07-21 DIAGNOSIS — M47812 Spondylosis without myelopathy or radiculopathy, cervical region: Secondary | ICD-10-CM | POA: Diagnosis not present

## 2023-07-21 DIAGNOSIS — Y92481 Parking lot as the place of occurrence of the external cause: Secondary | ICD-10-CM | POA: Insufficient documentation

## 2023-07-21 DIAGNOSIS — S199XXA Unspecified injury of neck, initial encounter: Secondary | ICD-10-CM | POA: Diagnosis not present

## 2023-07-21 DIAGNOSIS — Z79899 Other long term (current) drug therapy: Secondary | ICD-10-CM | POA: Diagnosis not present

## 2023-07-21 DIAGNOSIS — I428 Other cardiomyopathies: Secondary | ICD-10-CM | POA: Diagnosis not present

## 2023-07-21 DIAGNOSIS — E785 Hyperlipidemia, unspecified: Secondary | ICD-10-CM | POA: Diagnosis not present

## 2023-07-21 DIAGNOSIS — S0181XA Laceration without foreign body of other part of head, initial encounter: Secondary | ICD-10-CM | POA: Diagnosis not present

## 2023-07-21 DIAGNOSIS — N1831 Chronic kidney disease, stage 3a: Secondary | ICD-10-CM | POA: Diagnosis not present

## 2023-07-21 DIAGNOSIS — I700 Atherosclerosis of aorta: Secondary | ICD-10-CM | POA: Diagnosis not present

## 2023-07-21 DIAGNOSIS — Z96643 Presence of artificial hip joint, bilateral: Secondary | ICD-10-CM | POA: Diagnosis not present

## 2023-07-21 DIAGNOSIS — R296 Repeated falls: Secondary | ICD-10-CM | POA: Diagnosis not present

## 2023-07-21 DIAGNOSIS — I6782 Cerebral ischemia: Secondary | ICD-10-CM | POA: Diagnosis not present

## 2023-07-21 DIAGNOSIS — J439 Emphysema, unspecified: Secondary | ICD-10-CM | POA: Diagnosis not present

## 2023-07-21 DIAGNOSIS — S0003XA Contusion of scalp, initial encounter: Secondary | ICD-10-CM | POA: Diagnosis not present

## 2023-07-21 MED ORDER — ACETAMINOPHEN 325 MG PO TABS
650.0000 mg | ORAL_TABLET | Freq: Once | ORAL | Status: AC
  Administered 2023-07-21: 650 mg via ORAL
  Filled 2023-07-21: qty 2

## 2023-07-21 MED ORDER — OXYCODONE HCL 5 MG PO TABS: 2.5000 mg | ORAL_TABLET | Freq: Four times a day (QID) | ORAL | 0 refills | Status: DC | PRN

## 2023-07-21 MED ORDER — TETANUS-DIPHTH-ACELL PERTUSSIS 5-2.5-18.5 LF-MCG/0.5 IM SUSY
0.5000 mL | PREFILLED_SYRINGE | Freq: Once | INTRAMUSCULAR | Status: AC
  Administered 2023-07-21: 0.5 mL via INTRAMUSCULAR
  Filled 2023-07-21: qty 0.5

## 2023-07-21 NOTE — Discharge Instructions (Signed)
 As discussed, your imaging today was reassuring.  No obvious brain bleed, fracture or dislocation appreciated on imaging studies.  Given amount of tenderness suspect you are having in your tailbone area, concerned that there is a tailbone fracture.  Will send you home with medication to take for breakthrough pain recommend use of Tylenol  for baseline pain.  Recommend follow-up with your primary care for reassessment.  Please do not hesitate to return to the emergency department if the worrisome signs and symptoms we discussed become apparent.
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
## 2023-07-21 NOTE — ED Notes (Signed)
 Reviewed discharge instructions, follow up and pain management with pt. Pt accompanied by family. Pt transported via transport chair at time of discharge
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
## 2023-07-22 ENCOUNTER — Inpatient Hospital Stay (HOSPITAL_BASED_OUTPATIENT_CLINIC_OR_DEPARTMENT_OTHER)
Admission: EM | Admit: 2023-07-22 | Discharge: 2023-07-28 | DRG: 189 | Disposition: A | Discharge status: Skilled Nursing Facility | Attending: Internal Medicine | Admitting: Internal Medicine

## 2023-07-22 ENCOUNTER — Other Ambulatory Visit: Payer: Self-pay

## 2023-07-22 ENCOUNTER — Encounter (HOSPITAL_BASED_OUTPATIENT_CLINIC_OR_DEPARTMENT_OTHER): Payer: Self-pay | Admitting: Emergency Medicine

## 2023-07-22 ENCOUNTER — Emergency Department (HOSPITAL_BASED_OUTPATIENT_CLINIC_OR_DEPARTMENT_OTHER)

## 2023-07-22 DIAGNOSIS — I428 Other cardiomyopathies: Secondary | ICD-10-CM | POA: Diagnosis present

## 2023-07-22 DIAGNOSIS — R278 Other lack of coordination: Secondary | ICD-10-CM | POA: Diagnosis not present

## 2023-07-22 DIAGNOSIS — R296 Repeated falls: Secondary | ICD-10-CM | POA: Diagnosis not present

## 2023-07-22 DIAGNOSIS — D72829 Elevated white blood cell count, unspecified: Secondary | ICD-10-CM | POA: Diagnosis not present

## 2023-07-22 DIAGNOSIS — J9601 Acute respiratory failure with hypoxia: Principal | ICD-10-CM | POA: Diagnosis present

## 2023-07-22 DIAGNOSIS — D631 Anemia in chronic kidney disease: Secondary | ICD-10-CM | POA: Diagnosis present

## 2023-07-22 DIAGNOSIS — N1831 Chronic kidney disease, stage 3a: Secondary | ICD-10-CM | POA: Diagnosis present

## 2023-07-22 DIAGNOSIS — Z96612 Presence of left artificial shoulder joint: Secondary | ICD-10-CM | POA: Diagnosis present

## 2023-07-22 DIAGNOSIS — Z87891 Personal history of nicotine dependence: Secondary | ICD-10-CM

## 2023-07-22 DIAGNOSIS — Z8249 Family history of ischemic heart disease and other diseases of the circulatory system: Secondary | ICD-10-CM

## 2023-07-22 DIAGNOSIS — M62522 Muscle wasting and atrophy, not elsewhere classified, left upper arm: Secondary | ICD-10-CM | POA: Diagnosis not present

## 2023-07-22 DIAGNOSIS — Z96643 Presence of artificial hip joint, bilateral: Secondary | ICD-10-CM | POA: Diagnosis present

## 2023-07-22 DIAGNOSIS — I2510 Atherosclerotic heart disease of native coronary artery without angina pectoris: Secondary | ICD-10-CM | POA: Diagnosis present

## 2023-07-22 DIAGNOSIS — R2681 Unsteadiness on feet: Secondary | ICD-10-CM | POA: Diagnosis not present

## 2023-07-22 DIAGNOSIS — Z90710 Acquired absence of both cervix and uterus: Secondary | ICD-10-CM

## 2023-07-22 DIAGNOSIS — R0602 Shortness of breath: Secondary | ICD-10-CM | POA: Diagnosis not present

## 2023-07-22 DIAGNOSIS — W19XXXA Unspecified fall, initial encounter: Secondary | ICD-10-CM | POA: Diagnosis present

## 2023-07-22 DIAGNOSIS — J441 Chronic obstructive pulmonary disease with (acute) exacerbation: Secondary | ICD-10-CM | POA: Diagnosis not present

## 2023-07-22 DIAGNOSIS — Z23 Encounter for immunization: Secondary | ICD-10-CM

## 2023-07-22 DIAGNOSIS — E039 Hypothyroidism, unspecified: Secondary | ICD-10-CM | POA: Diagnosis present

## 2023-07-22 DIAGNOSIS — I509 Heart failure, unspecified: Secondary | ICD-10-CM | POA: Diagnosis present

## 2023-07-22 DIAGNOSIS — Z79890 Hormone replacement therapy (postmenopausal): Secondary | ICD-10-CM

## 2023-07-22 DIAGNOSIS — J439 Emphysema, unspecified: Secondary | ICD-10-CM | POA: Diagnosis present

## 2023-07-22 DIAGNOSIS — I130 Hypertensive heart and chronic kidney disease with heart failure and stage 1 through stage 4 chronic kidney disease, or unspecified chronic kidney disease: Secondary | ICD-10-CM | POA: Diagnosis present

## 2023-07-22 DIAGNOSIS — E785 Hyperlipidemia, unspecified: Secondary | ICD-10-CM | POA: Diagnosis present

## 2023-07-22 DIAGNOSIS — Z79899 Other long term (current) drug therapy: Secondary | ICD-10-CM

## 2023-07-22 DIAGNOSIS — I5033 Acute on chronic diastolic (congestive) heart failure: Secondary | ICD-10-CM | POA: Diagnosis not present

## 2023-07-22 DIAGNOSIS — M62521 Muscle wasting and atrophy, not elsewhere classified, right upper arm: Secondary | ICD-10-CM | POA: Diagnosis not present

## 2023-07-22 DIAGNOSIS — F32A Depression, unspecified: Secondary | ICD-10-CM | POA: Diagnosis present

## 2023-07-22 DIAGNOSIS — M353 Polymyalgia rheumatica: Secondary | ICD-10-CM | POA: Diagnosis present

## 2023-07-22 DIAGNOSIS — S0181XA Laceration without foreign body of other part of head, initial encounter: Secondary | ICD-10-CM | POA: Diagnosis present

## 2023-07-22 DIAGNOSIS — R7989 Other specified abnormal findings of blood chemistry: Secondary | ICD-10-CM | POA: Diagnosis not present

## 2023-07-22 DIAGNOSIS — G20A1 Parkinson's disease without dyskinesia, without mention of fluctuations: Secondary | ICD-10-CM | POA: Diagnosis present

## 2023-07-22 DIAGNOSIS — I700 Atherosclerosis of aorta: Secondary | ICD-10-CM | POA: Diagnosis not present

## 2023-07-22 LAB — BASIC METABOLIC PANEL WITH GFR
Anion gap: 16 — ABNORMAL HIGH (ref 5–15)
BUN: 24 mg/dL — ABNORMAL HIGH (ref 8–23)
CO2: 21 mmol/L — ABNORMAL LOW (ref 22–32)
Calcium: 9.1 mg/dL (ref 8.9–10.3)
Chloride: 99 mmol/L (ref 98–111)
Creatinine, Ser: 1.12 mg/dL — ABNORMAL HIGH (ref 0.44–1.00)
GFR, Estimated: 53 mL/min — ABNORMAL LOW (ref >60)
Glucose, Bld: 105 mg/dL — ABNORMAL HIGH (ref 70–99)
Potassium: 4.3 mmol/L (ref 3.5–5.1)
Sodium: 135 mmol/L (ref 135–145)

## 2023-07-22 LAB — I-STAT VENOUS BLOOD GAS, ED
Acid-base deficit: 1 mmol/L (ref 0.0–2.0)
Bicarbonate: 25.5 mmol/L (ref 20.0–28.0)
Calcium, Ion: 1.18 mmol/L (ref 1.15–1.40)
HCT: 35 % — ABNORMAL LOW (ref 36.0–46.0)
Hemoglobin: 11.9 g/dL — ABNORMAL LOW (ref 12.0–15.0)
O2 Saturation: 72 %
Patient temperature: 98.9
Potassium: 4.4 mmol/L (ref 3.5–5.1)
Sodium: 135 mmol/L (ref 135–145)
TCO2: 27 mmol/L (ref 22–32)
pCO2, Ven: 49.9 mmHg (ref 44–60)
pH, Ven: 7.317 (ref 7.25–7.43)
pO2, Ven: 42 mmHg (ref 32–45)

## 2023-07-22 LAB — CBC
HCT: 36.4 % (ref 36.0–46.0)
Hemoglobin: 11.9 g/dL — ABNORMAL LOW (ref 12.0–15.0)
MCH: 31.3 pg (ref 26.0–34.0)
MCHC: 32.7 g/dL (ref 30.0–36.0)
MCV: 95.8 fL (ref 80.0–100.0)
Platelets: 169 K/uL (ref 150–400)
RBC: 3.8 MIL/uL — ABNORMAL LOW (ref 3.87–5.11)
RDW: 13.8 % (ref 11.5–15.5)
WBC: 10.2 K/uL (ref 4.0–10.5)
nRBC: 0 % (ref 0.0–0.2)

## 2023-07-22 LAB — PRO BRAIN NATRIURETIC PEPTIDE: Pro Brain Natriuretic Peptide: 198 pg/mL (ref <300.0)

## 2023-07-22 LAB — TROPONIN T, HIGH SENSITIVITY
Troponin T High Sensitivity: 36 ng/L — ABNORMAL HIGH (ref <19)
Troponin T High Sensitivity: 34 ng/L — ABNORMAL HIGH (ref <19)

## 2023-07-22 LAB — D-DIMER, QUANTITATIVE: D-Dimer, Quant: 5.4 ug{FEU}/mL — ABNORMAL HIGH (ref 0.00–0.50)

## 2023-07-22 MED ORDER — IPRATROPIUM BROMIDE 0.02 % IN SOLN
0.5000 mg | Freq: Once | RESPIRATORY_TRACT | Status: AC
  Administered 2023-07-22: 0.5 mg via RESPIRATORY_TRACT
  Filled 2023-07-22: qty 2.5

## 2023-07-22 MED ORDER — MAGNESIUM SULFATE 2 GM/50ML IV SOLN
2.0000 g | Freq: Once | INTRAVENOUS | Status: AC
  Administered 2023-07-22: 2 g via INTRAVENOUS
  Filled 2023-07-22: qty 50

## 2023-07-22 MED ORDER — IOHEXOL 350 MG/ML SOLN
75.0000 mL | Freq: Once | INTRAVENOUS | Status: AC | PRN
  Administered 2023-07-22: 75 mL via INTRAVENOUS

## 2023-07-22 MED ORDER — ALBUTEROL SULFATE (2.5 MG/3ML) 0.083% IN NEBU
5.0000 mg | INHALATION_SOLUTION | Freq: Once | RESPIRATORY_TRACT | Status: AC
  Administered 2023-07-22: 5 mg via RESPIRATORY_TRACT
  Filled 2023-07-22: qty 6

## 2023-07-22 MED ORDER — ACETAMINOPHEN 325 MG PO TABS
650.0000 mg | ORAL_TABLET | Freq: Once | ORAL | Status: AC
  Administered 2023-07-22: 650 mg via ORAL
  Filled 2023-07-22: qty 2

## 2023-07-22 MED ORDER — METHYLPREDNISOLONE SODIUM SUCC 125 MG IJ SOLR
125.0000 mg | Freq: Once | INTRAMUSCULAR | Status: AC
  Administered 2023-07-22: 125 mg via INTRAVENOUS
  Filled 2023-07-22: qty 2

## 2023-07-22 NOTE — ED Provider Notes (Signed)
 April [name] Provider Note   CSN: [id number] Arrival date & time: 07/22/23  1751     Patient presents with: Shortness of Breath   April [name] is a 68 y.o. female.   Pt is a 68y/o female with hx of nonischemic cardiomyopathy EF of 55% in 2023, asthma, anemia, AVN, CKD, COPD, GERD, hypertension, hyperlipidemia, hypothyroidism who is returning due to worsening SOB and now swelling in the legs.  Pt states that she had bronchitis about 3 weeks ago with significant cough and wheezing and saw her PCP 3 times with 2 rounds of abx and steroids and reports now she is having worsening SOB and and DOE.  Pt state she was in the ER yesterday after a fall and while here her sats dropped after ambulating but came back quickly at rest.  She does not wear O2 at home and states today O2 sats dropped to 82% on RA and she could only walk a very short distance.  She has had some pressure in her chest that radiates to the back bilaterally but denies abd pain, vomiting or diarrhea.  She does not check her weight regularly and denies taking any fluid pills recently.  No prior hx of clot and denies recent immobilization.  She states inhalers help with wheezing but does not help with her SOB.    The history is provided by the patient and medical records.  Shortness of Breath      Prior to Admission medications   Medication Sig Start Date End Date Taking? Authorizing Provider  albuterol  (VENTOLIN  HFA) 108 (90 Base) MCG/ACT inhaler Inhale 2 puffs into the lungs every 6 (six) hours as needed for wheezing or shortness of breath. 04/21/23   [name], NP  acetaminophen  (TYLENOL ) 500 MG tablet Take 1,000 mg by mouth every 6 (six) hours as needed for moderate pain or headache.    [provider]  albuterol  (VENTOLIN  HFA) 108 (90 Base) MCG/ACT inhaler Inhale 2 puffs into the lungs every 6 hours as needed for wheezing or shortness of breath. 02/17/23   Cobb,  Comer GAILS, NP  amoxicillin  (AMOXIL ) 500 MG capsule Take 2,000 mg by mouth once. Take before a dental appt    [provider]  amoxicillin -clavulanate (AUGMENTIN ) 875-125 MG tablet Take 1 tablet by mouth 2 (two) times daily. 06/27/23   Jude Harden GAILS, MD  azithromycin  (ZITHROMAX ) 250 MG tablet Take 1 tablet (250 mg total) by mouth every Monday, Wednesday, and Friday. 03/03/23 02/02/24  [name], NP  budeson-glycopyrrolate -formoterol  (BREZTRI  AEROSPHERE) 160-9-4.8 MCG/ACT AERO Inhale 2 puffs into the lungs every 12 (twelve) hours. 03/30/23   [name], NP  buPROPion  (WELLBUTRIN  XL) 300 MG 24 hr tablet TAKE 1 TABLET BY MOUTH DAILY 06/07/23   Jodie Lavern CROME, MD  Calcium  500 MG CHEW Chew 500 mg by mouth 2 (two) times daily.    [provider]  carbidopa -levodopa  (SINEMET  IR) 25-100 MG tablet Take 1 tablet by mouth 3 (three) times daily. 02/03/23   Leigh Venetia CROME, MD  Cholecalciferol  250 MCG (10000 UT) CAPS Take 500 capsules by mouth daily. 10/24/18   [provider]  Cyanocobalamin  (VITAMIN B 12 PO) Take by mouth.    [provider]  furosemide  (LASIX ) 20 MG tablet TAKE 1 TABLET BY MOUTH DAILY AS NEEDED FOR EDEMA 07/07/23   [name], MD  ibandronate  (BONIVA ) 150 MG tablet TAKE 1 TABLET BY MOUTH EACH MONTH WITH 8oz OF plain  WATER 60 mins BEFORE first food, drink, OR meds. stay upright FOR 60 MINUTES. 08/25/22   Webb, Padonda B, FNP  levothyroxine  (SYNTHROID ) 100 MCG tablet Take 1 tablet (100 mcg total) by mouth daily. 03/10/23   Jodie Lavern CROME, MD  metoprolol  succinate (TOPROL -XL) 25 MG 24 hr tablet Take 1 tablet (25 mg total) by mouth daily. 03/01/23   [name], MD  omeprazole  (PRILOSEC) 20 MG capsule TAKE 1 CAPSULE BY MOUTH EVERY DAY 01/20/23   Jodie Lavern CROME, MD  oxyCODONE  (ROXICODONE ) 5 MG immediate release tablet Take 0.5 tablets (2.5 mg total) by mouth every 6 (six) hours as needed for severe pain (pain score 7-10). 07/21/23    Silver Wonda LABOR, PA  Polyethyl Glycol-Propyl Glycol (SYSTANE OP) Place 1 drop into both eyes as needed.    [provider]  potassium chloride  (KLOR-CON  M) 10 MEQ tablet Take 1 tablet (10 mEq total) by mouth daily as needed (when you take the lasix ). 03/01/23   [name], MD  predniSONE  (DELTASONE ) 10 MG tablet 4 tabs for 2 days, then 3 tabs for 2 days, 2 tabs for 2 days, then 1 tab for 2 days, then stop 04/21/23   [name], NP  rosuvastatin  (CRESTOR ) 10 MG tablet Take 1 tablet (10 mg total) by mouth at bedtime. 03/01/23   [name], MD  Spacer/Aero-Holding Chambers (AEROCHAMBER MV) inhaler Use as instructed 02/06/19   [name], NP  tiZANidine  (ZANAFLEX ) 4 MG tablet TAKE 1/2 TO 1 TABLET BY MOUTH EVERY 6 HOURS AS NEEDED FOR MUSCLE SPASMS 09/16/22   Jodie Lavern CROME, MD  valsartan  (DIOVAN ) 40 MG tablet Take 1 tablet (40 mg total) by mouth daily. 03/01/23   [name], MD  vitamin C (ASCORBIC ACID) 250 MG tablet Take 500 mg by mouth daily.    [provider]    Allergies: Sulfonamide derivatives, Alendronate sodium, Dilaudid  [hydromorphone  hcl], Sulfa antibiotics, Other, and Wound dressing adhesive    Review of Systems  Respiratory:  Positive for shortness of breath.     Updated Vital Signs BP 114/67 (BP Location: Left Arm)   Pulse (!) 122   Temp 98.5 F (36.9 C) (Oral)   Resp 18   SpO2 95%   Physical Exam Vitals and nursing note reviewed.  Constitutional:      General: She is not in acute distress.    Appearance: She is well-developed.  HENT:     Head: Normocephalic and atraumatic.  Eyes:     Pupils: Pupils are equal, round, and reactive to light.  Cardiovascular:     Rate and Rhythm: Regular rhythm. Tachycardia present.     Pulses: Normal pulses.     Heart sounds: Normal heart sounds. No murmur heard.    No friction rub.  Pulmonary:     Effort: Pulmonary effort is normal. Tachypnea present.     Breath  sounds: Decreased breath sounds present. No wheezing or rales.     Comments: Globally decreased breath sounds Abdominal:     General: Bowel sounds are normal. There is no distension.     Palpations: Abdomen is soft.     Tenderness: There is no abdominal tenderness. There is no guarding or rebound.  Musculoskeletal:        General: No tenderness. Normal range of motion.     Right lower leg: Edema present.     Left lower leg: Edema present.     Comments: 1+ pitting edema in the ankles bilaterally  Skin:  General: Skin is warm and dry.     Findings: No rash.  Neurological:     Mental Status: She is alert and oriented to person, place, and time. Mental status is at baseline.     Cranial Nerves: No cranial nerve deficit.  Psychiatric:        Behavior: Behavior normal.     (all labs ordered are listed, but only abnormal results are displayed) Labs Reviewed  CBC - Abnormal; Notable for the following components:      Result Value   RBC 3.80 (*)    Hemoglobin 11.9 (*)    All other components within normal limits  BASIC METABOLIC PANEL WITH GFR - Abnormal; Notable for the following components:   CO2 21 (*)    Glucose, Bld 105 (*)    BUN 24 (*)    Creatinine, Ser 1.12 (*)    GFR, Estimated 53 (*)    Anion gap 16 (*)    All other components within normal limits  D-DIMER, QUANTITATIVE  PRO BRAIN NATRIURETIC PEPTIDE  TROPONIN T, HIGH SENSITIVITY    EKG: EKG Interpretation Date/Time:  Thursday July 22 2023 18:07:06 EDT Ventricular Rate:  119 PR Interval:  139 QRS Duration:  98 QT Interval:  328 QTC Calculation: 462 R Axis:   85  Text Interpretation: new Sinus tachycardia Borderline right axis deviation Confirmed by Doretha Folks ([id number]) on 07/22/2023 6:46:33 PM  Radiology: [facility] Chest 2 View Result Date: 07/22/2023 EXAM: 2 VIEW(S) XRAY OF THE CHEST 07/22/2023 06:23:00 PM COMPARISON: Dry 16 2025 CLINICAL HISTORY: SOB. Was seen yesterday for same started she felt SOB this  afternoon and it got worse O2 sat down to 82 at home RA 95 now. States her feet are swelling now. FINDINGS: LUNGS AND PLEURA: No focal pulmonary opacity. No pulmonary edema. No pleural effusion. No pneumothorax. Hyperinflation and chronic bronchitic change. HEART AND MEDIASTINUM: No acute abnormality of the cardiac and mediastinal silhouettes. BONES AND SOFT TISSUES: No acute osseous abnormality. IMPRESSION: 1. No acute process. Hyperinflation. Electronically signed by: Norman Gatlin MD 07/22/2023 06:55 PM EDT RP Workstation: [id number]   DG Sacrum/Coccyx Result Date: 07/21/2023 CLINICAL DATA:  Pain after fall EXAM: SACRUM AND COCCYX - 2+ VIEW COMPARISON:  05/11/2023 FINDINGS: Bilateral hip replacements. Patent SI joints. No definite acute displaced fracture is seen. IMPRESSION: No definite acute osseous abnormality. Bilateral hip replacements. Cross-sectional imaging follow-up if high suspicion for fracture. Electronically Signed   By: [name] Bun M.D.   On: 07/21/2023 16:52   CT Cervical Spine Wo Contrast Result Date: 07/21/2023 CLINICAL DATA:  Neck trauma EXAM: CT CERVICAL SPINE WITHOUT CONTRAST TECHNIQUE: Multidetector CT imaging of the cervical spine was performed without intravenous contrast. Multiplanar CT image reconstructions were also generated. RADIATION DOSE REDUCTION: This exam was performed according to the departmental dose-optimization program which includes automated exposure control, adjustment of the mA and/or kV according to patient size and/or use of iterative reconstruction technique. COMPARISON:  CT 10/16/2021 FINDINGS: Alignment: No subluxation.  Facet alignment is within normal limits. Skull base and vertebrae: No acute fracture. No primary bone lesion or focal pathologic process. Soft tissues and spinal canal: No prevertebral fluid or swelling. No visible canal hematoma. Disc levels: Multilevel degenerative changes. Moderate disc space narrowing C5-C6 and C6-C7. Multilevel  hypertrophic facet degenerative changes with foraminal narrowing. Upper chest: Apical emphysema and scarring Other: None IMPRESSION: Degenerative changes without acute osseous abnormality. Electronically Signed   By: [name] Bun M.D.   On: 07/21/2023 16:48   CT  Head Wo Contrast Result Date: 07/21/2023 CLINICAL DATA:  Head trauma EXAM: CT HEAD WITHOUT CONTRAST TECHNIQUE: Contiguous axial images were obtained from the base of the skull through the vertex without intravenous contrast. RADIATION DOSE REDUCTION: This exam was performed according to the departmental dose-optimization program which includes automated exposure control, adjustment of the mA and/or kV according to patient size and/or use of iterative reconstruction technique. COMPARISON:  Head CT 01/15/2022, MRI 01/15/2022 FINDINGS: Brain: No acute territorial infarction, hemorrhage, or intracranial mass. Atrophy and mild chronic small vessel ischemic changes of the white matter. Stable ventricle size. Vascular: No hyperdense vessels.  Carotid vascular calcification. Skull: Normal. Negative for fracture or focal lesion. Sinuses/Orbits: No acute finding. Other: Left posterior scalp hematoma IMPRESSION: 1. No CT evidence for acute intracranial abnormality. 2. Atrophy and mild chronic small vessel ischemic changes of the white matter. Left posterior scalp hematoma. Electronically Signed   By: [name] Bun M.D.   On: 07/21/2023 16:45   DG Thoracic Spine 2 View Result Date: 07/21/2023 CLINICAL DATA:  Fall with back pain EXAM: THORACIC SPINE 2 VIEWS COMPARISON:  Chest CT 12/14/2022 FINDINGS: Thoracic alignment is within normal limits. Vertebral body heights are maintained. Moderate multilevel degenerative osteophyte of the mid to lower thoracic spine. IMPRESSION: No acute osseous abnormality. Electronically Signed   By: [name] Bun M.D.   On: 07/21/2023 16:41   DG Chest 1 View Result Date: 07/21/2023 CLINICAL DATA:  Pain EXAM: CHEST  1 VIEW  COMPARISON:  None Available. FINDINGS: Hyperinflation. No consolidation, pneumothorax or effusion. No edema. Normal cardiopericardial silhouette. Fixation hardware along the left shoulder for arthroplasty at the edge of the imaging field. Degenerative changes of the spine. IMPRESSION: Hyperinflation with chronic changes. No acute cardiopulmonary disease. Electronically Signed   By: Ranell Bring M.D.   On: 07/21/2023 16:39     Procedures   Medications Ordered in the ED  acetaminophen  (TYLENOL ) tablet 650 mg (650 mg Oral Given 07/22/23 1931)                                    Medical Decision Making Amount and/or Complexity of Data Reviewed External Data Reviewed: notes. Labs: ordered. Decision-making details documented in ED Course. Radiology: ordered and independent interpretation performed. Decision-making details documented in ED Course. ECG/medicine tests: ordered and independent interpretation performed. Decision-making details documented in ED Course.  Risk OTC drugs. Prescription drug management.   Pt with multiple medical problems and comorbidities and presenting today with a complaint that caries a high risk for morbidity and mortality.  Here today with the above complaints.  Concern for CHF exacerbation, ACS, PE, pneumothorax given recent fall, COPD exacerbation, anemia, lower suspicion for dissection, GI pathology.  Also concern for possible pneumonia, myocarditis. I independently reviewed and interpreted patient's labs and CBC with stable hemoglobin of 11.9, normal white count, BMP without acute findings.  Troponin, D-dimer and BMP are pending. Troponin mildly elevated today at 34, D-dimer elevated at 5.4 and BNP is within normal limits.  CTA done due to elevated D-dimer without evidence of PE at this time.  CTA was negative.  Patient had desaturation even when moving over to have her CAT scan and is now requiring 2 L of oxygen .  She reports feeling much better with oxygen .  She  does have globally decreased breath sounds and concerned that patient's symptoms could be all coming from a COPD exacerbation.  There is no evidence  of pericardial effusion on CT or other remarkable findings.  She was given DuoNeb, Solu-Medrol  and magnesium .  Will continue to follow but if patient still has continuous hypoxia with ambulation and continues to need oxygen  she will need admission.  She and her husband are comfortable with this plan.      Final diagnoses:  None    ED Discharge Orders     None          Doretha Folks, MD 07/22/23 2251

## 2023-07-22 NOTE — ED Triage Notes (Signed)
 Was seen yesterday for same started she felt sob  this afternoon and it got worse O2 sat down to 82 at home RA  95 now RA states her feet are swelling

## 2023-07-22 NOTE — ED Notes (Signed)
 Patient transported to CT

## 2023-07-22 NOTE — ED Notes (Signed)
 RT assessed patient in triage. Stated that her SAT was reading 86% at home and she felt SOB. Upon assessment, SAT 95-96% and BBS clear. Stated she was seen by her dr today and told it could be bronchitis. Took MDI prior to arrival.

## 2023-07-22 NOTE — ED Notes (Signed)
 Rn called to lobby by pt request.  Pt reports headache, requesting tylenol .  EDP plunkett made aware.  Verbal order for tylenol  given.

## 2023-07-22 NOTE — ED Provider Notes (Signed)
 Care of patient received from prior provider at 10:59 PM, please see their note for complete H/P and care plan.  Received handoff per ED course.  Clinical Course as of 07/22/23 2259  Thu Jul 22, 2023  2257 Stable HO WP 65 YOF with  a history of CAD/CHF Recent URI/Bronchitis and failed OP meds. Was seen recently with a fall.  COPD vs CHF vs PE with new acute hypoxic respiratory failure 82% on RA with ambulation Negative BNP/CTAPE On 2L [place] (none at baseline) Getting therapy and reassess. [CC]    Clinical Course User Index [CC] Jerral Meth, MD    Reassessment: Patient failed ambulation trial after breathing treatments.  Still very short of breath feeling fatigued and weak. Arrange for admission to medicine for acute hypoxic respiratory failure.  Disposition:   Based on the above findings, I believe this patient is stable for admission.    Patient/family educated about specific findings on our evaluation and explained exact reasons for admission.  Patient/family educated about clinical situation and time was allowed to answer questions.   Admission team communicated with and agreed with need for admission. Patient admitted. Patient ready to move at this time.     Emergency Department Medication Summary:   Medications  acetaminophen  (TYLENOL ) tablet 650 mg (650 mg Oral Given 07/22/23 1931)  iohexol  (OMNIPAQUE ) 350 MG/ML injection 75 mL (75 mLs Intravenous Contrast Given 07/22/23 2204)  albuterol  (PROVENTIL ) (2.5 MG/3ML) 0.083% nebulizer solution 5 mg (5 mg Nebulization Given 07/22/23 2255)  ipratropium (ATROVENT ) nebulizer solution 0.5 mg (0.5 mg Nebulization Given 07/22/23 2256)  methylPREDNISolone  sodium succinate (SOLU-MEDROL ) 125 mg/2 mL injection 125 mg (125 mg Intravenous Given 07/22/23 2309)  magnesium  sulfate IVPB 2 g 50 mL (0 g Intravenous Stopped 07/23/23 0129)            Jerral Meth, MD 07/23/23 [phone]

## 2023-07-23 DIAGNOSIS — I10 Essential (primary) hypertension: Secondary | ICD-10-CM | POA: Diagnosis not present

## 2023-07-23 DIAGNOSIS — J441 Chronic obstructive pulmonary disease with (acute) exacerbation: Secondary | ICD-10-CM

## 2023-07-23 DIAGNOSIS — R000 Tachycardia, unspecified: Secondary | ICD-10-CM | POA: Diagnosis not present

## 2023-07-23 DIAGNOSIS — J9601 Acute respiratory failure with hypoxia: Secondary | ICD-10-CM | POA: Diagnosis present

## 2023-07-23 DIAGNOSIS — J449 Chronic obstructive pulmonary disease, unspecified: Secondary | ICD-10-CM | POA: Diagnosis not present

## 2023-07-23 DIAGNOSIS — Z743 Need for continuous supervision: Secondary | ICD-10-CM | POA: Diagnosis not present

## 2023-07-23 LAB — HIV ANTIBODY (ROUTINE TESTING W REFLEX): HIV Screen 4th Generation wRfx: NONREACTIVE

## 2023-07-23 MED ORDER — ACETAMINOPHEN 650 MG RE SUPP: 650.0000 mg | Freq: Four times a day (QID) | RECTAL | Status: DC | PRN

## 2023-07-23 MED ORDER — ENSURE PLUS HIGH PROTEIN PO LIQD
237.0000 mL | Freq: Two times a day (BID) | ORAL | Status: DC
  Administered 2023-07-24 – 2023-07-28 (×6): 237 mL via ORAL

## 2023-07-23 MED ORDER — ALBUTEROL SULFATE (2.5 MG/3ML) 0.083% IN NEBU: 2.5000 mg | INHALATION_SOLUTION | RESPIRATORY_TRACT | Status: DC | PRN

## 2023-07-23 MED ORDER — ONDANSETRON HCL 4 MG PO TABS: 4.0000 mg | ORAL_TABLET | Freq: Four times a day (QID) | ORAL | Status: DC | PRN

## 2023-07-23 MED ORDER — METHYLPREDNISOLONE SODIUM SUCC 40 MG IJ SOLR
40.0000 mg | Freq: Two times a day (BID) | INTRAMUSCULAR | Status: DC
  Administered 2023-07-23 – 2023-07-24 (×4): 40 mg via INTRAVENOUS
  Filled 2023-07-23 (×4): qty 1

## 2023-07-23 MED ORDER — BUDESON-GLYCOPYRROL-FORMOTEROL 160-9-4.8 MCG/ACT IN AERO
2.0000 | INHALATION_SPRAY | Freq: Two times a day (BID) | RESPIRATORY_TRACT | Status: DC
  Administered 2023-07-23 – 2023-07-28 (×11): 2 via RESPIRATORY_TRACT
  Filled 2023-07-23: qty 5.9

## 2023-07-23 MED ORDER — ACETAMINOPHEN 325 MG PO TABS
650.0000 mg | ORAL_TABLET | Freq: Four times a day (QID) | ORAL | Status: DC | PRN
  Administered 2023-07-23 – 2023-07-27 (×7): 650 mg via ORAL
  Filled 2023-07-23 (×7): qty 2

## 2023-07-23 MED ORDER — ENOXAPARIN SODIUM 40 MG/0.4ML IJ SOSY
40.0000 mg | PREFILLED_SYRINGE | INTRAMUSCULAR | Status: DC
  Administered 2023-07-23 – 2023-07-28 (×6): 40 mg via SUBCUTANEOUS
  Filled 2023-07-23 (×6): qty 0.4

## 2023-07-23 MED ORDER — ONDANSETRON HCL 4 MG/2ML IJ SOLN: 4.0000 mg | Freq: Four times a day (QID) | INTRAMUSCULAR | Status: DC | PRN

## 2023-07-23 NOTE — Care Management Obs Status (Signed)
 MEDICARE OBSERVATION STATUS NOTIFICATION   Patient Details  Name: TAIESHA BOVARD MRN: [id number] Date of Birth: [birth year]   Medicare Observation Status Notification Given:  Yes    Toy LITTIE Agar, RN 07/23/2023, 3:38 PM

## 2023-07-23 NOTE — Plan of Care (Signed)
   Problem: Activity: Goal: Risk for activity intolerance will decrease Outcome: Progressing   Problem: Nutrition: Goal: Adequate nutrition will be maintained Outcome: Progressing   Problem: Coping: Goal: Level of anxiety will decrease Outcome: Progressing   Problem: Pain Managment: Goal: General experience of comfort will improve and/or be controlled Outcome: Progressing

## 2023-07-23 NOTE — Plan of Care (Signed)
 [facility] treatment transfer:  68 year old female past medical history of nonischemic cardiomyopathy EF of 55% in 2023, asthma, anemia, AVN, CKD, COPD, GERD, hypertension, hyperlipidemia, hypothyroidism who is returning due to worsening SOB and now swelling in the legs. Pt states that she had bronchitis about 3 weeks ago with significant cough and wheezing and saw her PCP 3 times with 2 rounds of abx and steroids and reports now she is having worsening SOB and and DOE. Pt state she was in the ER yesterday after a fall and while here her sats dropped after ambulating but came back quickly at rest. She does not wear O2 at home and states today O2 sats dropped to 82% on RA and she could only walk a very short distance. She has had some pressure in her chest that radiates to the back bilaterally but denies abd pain, vomiting or diarrhea.  At presentation to ED patient found tachycardic and currently maintaining O2 sat 92% 4 L oxygen .  Otherwise hemodynamically stable. VBG normal. Elevated however flat troponin 36 and 34.  Elevated D-dimer 5.4. CBC unremarkable with a stable H&H 9.9 and 36. BMP showed low bicarb 21, elevated creatinine 1.12 and elevated Annick of 16.  Normal proBNP.  CTA chest no evidence of pulmonary embolism.  No acute abnormality noted.  Aortic atherosclerosis. Chest x-ray no active disease process.  Hyperinflation.  In the ED patient has been given mag sulfate, methylprednisolone  and Atrovent .   Hospitalist has been consulted for further pressure management COPD exacerbation, acute hypoxic respiratory failure in the context of COPD exacerbation, demand ischemia, AKI and anion gap metabolic acidosis.

## 2023-07-23 NOTE — Progress Notes (Signed)
   07/23/23 1557  TOC Brief Assessment  Insurance and Status Reviewed  Patient has primary care physician Yes Zondra, Jon SAILOR, NP)  Home environment has been reviewed Home  Prior level of function: Independent  Prior/Current Home Services No current home services  Social Drivers of Health Review SDOH reviewed no interventions necessary  Readmission risk has been reviewed Yes  Transition of care needs no transition of care needs at this time

## 2023-07-23 NOTE — Plan of Care (Signed)

## 2023-07-23 NOTE — H&P (Signed)
 History and Physical  April [name] FMW:[id number] DOB: 1955-03-21 DOA: 07/22/2023  PCP: Tonnie Jon SAILOR, NP   Chief Complaint: Cough, shortness of breath  HPI: April [name] is a 68 y.o. female with medical history significant for COPD on room air, Parkinson's disease, CKD, nonischemic cardiomyopathy with preserved EF being admitted to the hospital with 3 weeks of cough, shortness of breath, dyspnea with exertion found to have acute hypoxic respiratory failure due to acute exacerbation of COPD.  History provided by the patient, who says she is followed by [facility] who in conjunction with her PCP over the last couple of weeks has prescribed her oral steroids and a couple of rounds of antibiotics including amoxicillin  and doxycycline .  States that for the last 3 weeks, she has had progressive dyspnea with exertion, cough productive of copious amount of sputum, and shortness of breath which has progressed to even being at rest.  She has never required supplemental oxygen  at home.  Yesterday before she came to the ER for continued and worsening dyspnea and cough, she noted at home that her pulse ox was reading 82%.  In the emergency department, workup as detailed below has ruled out acute pneumonia, PE, shows evidence of COPD exacerbation.  She received breathing treatments, IV steroids and feels that her breathing is significantly better, though she has been maintained on 2 L of nasal cannula oxygen .  Review of Systems: Please see HPI for pertinent positives and negatives. A complete 10 system review of systems are otherwise negative.  Past Medical History:  Diagnosis Date   Allergy    Anemia    Anxiety    Anxiety and depression    related to caring for mother during terminal illness   Arthritis    Asthma    AVN (avascular necrosis of bone) (HCC)    hip and wrist   Bronchitis    hx of   Cataract    Chronic kidney disease    COPD (chronic obstructive pulmonary disease) (HCC)     Depression    Emphysema of lung (HCC)    Endometriosis    FH: CAD (coronary artery disease)    GERD (gastroesophageal reflux disease)    ocassional   History of blood transfusion    after hip repackment - broke out in hives and started itching   History of palpitations    Hyperlipidemia    Hypertension    Hypothyroid    Neuromuscular disorder (HCC)    Non-ischemic cardiomyopathy (HCC) 2019   Osteopenia    forteo through Dr. Dolphus (started 10/12)   Osteoporosis    PMR (polymyalgia rheumatica) (HCC)    SIRS (systemic inflammatory response syndrome) (HCC) 10/2017   Smoker    SOB (shortness of breath) on exertion    Squamous cell carcinoma    facial, 2016   Temporal arteritis (HCC)    s/p prednisone  taper   Past Surgical History:  Procedure Laterality Date   ABDOMINAL ADHESION SURGERY     ABDOMINAL HYSTERECTOMY     BREAST EXCISIONAL BIOPSY Left    BREAST REDUCTION SURGERY Bilateral 06/13/2019   Procedure: MAMMARY REDUCTION  (BREAST);  Surgeon: Elisabeth Craig RAMAN, MD;  Location: [facility] OR;  Service: Plastics;  Laterality: Bilateral;   BREAST SURGERY Left    benign bx 1990   CARDIAC CATHETERIZATION  2007   no PCI   CATARACT EXTRACTION Bilateral    CHOLECYSTECTOMY     COLONOSCOPY     COLONOSCOPY W/ POLYPECTOMY  INCONTINENCE SURGERY     2007    JOINT REPLACEMENT Left 2012   left hip   REDUCTION MAMMAPLASTY     2021   REVERSE SHOULDER ARTHROPLASTY Left 05/28/2020   Procedure: REVERSE SHOULDER ARTHROPLASTY;  Surgeon: Sharl Selinda Dover, MD;  Location: [facility] OR;  Service: Orthopedics;  Laterality: Left;  2.5 hrs   TONSILLECTOMY     TOTAL HIP ARTHROPLASTY Right 10/04/2012   Procedure: TOTAL HIP ARTHROPLASTY;  Surgeon: Maude LELON Right, MD;  Location: [facility] OR;  Service: Orthopedics;  Laterality: Right;   TOTAL SHOULDER REPLACEMENT Left    WRIST SURGERY Left    2012/ left wrist/ bone removed due to necrosis   Social History:  reports that she quit smoking about 5 years  ago. Her smoking use included cigarettes. She started smoking about 39 years ago. She has a 11.2 pack-year smoking history. She has been exposed to tobacco smoke. She has never used smokeless tobacco. She reports current alcohol  use. She reports that she does not use drugs.  Allergies  Allergen Reactions   Sulfonamide Derivatives Anaphylaxis and Rash    As a child.  Throat closed, rash.   Alendronate Sodium Other (See Comments)    GI upset   Dilaudid  [Hydromorphone  Hcl] Nausea And Vomiting   Sulfa Antibiotics Hives   Other Other (See Comments)    Adhesive. Redness     Wound Dressing Adhesive Other (See Comments)    Redness, adhesive tapes. Needs PAPER TAPE.    Family History  Problem Relation Age of Onset   Heart disease Mother    Hypertension Mother    Alzheimer's disease Mother    Colon cancer Mother    Kidney failure Mother    Arthritis Brother    Suicidality Brother    Colon polyps Brother    Breast cancer Maternal Aunt    Healthy Son    Esophageal cancer Neg Hx    Stomach cancer Neg Hx    Rectal cancer Neg Hx    Crohn's disease Neg Hx      Prior to Admission medications   Medication Sig Start Date End Date Taking? Authorizing Provider  doxycycline  (VIBRAMYCIN ) 100 MG capsule Take 100 mg by mouth 2 (two) times daily. 07/12/23  Yes [provider]  sertraline (ZOLOFT) 50 MG tablet 0.5 tablet once a day for 10 days, 1 tablet once a day for 20 days Orally Once a day for 30 days 07/08/23  Yes [provider]  albuterol  (VENTOLIN  HFA) 108 (90 Base) MCG/ACT inhaler Inhale 2 puffs into the lungs every 6 hours as needed for wheezing or shortness of breath. 02/17/23   Cobb, Comer GAILS, NP  amoxicillin  (AMOXIL ) 500 MG capsule Take 2,000 mg by mouth once. Take before a dental appt    [provider]  amoxicillin -clavulanate (AUGMENTIN ) 875-125 MG tablet Take 1 tablet by mouth 2 (two) times daily. 06/27/23   Jude Harden GAILS, MD  azithromycin  (ZITHROMAX ) 250 MG  tablet Take 1 tablet (250 mg total) by mouth every Monday, Wednesday, and Friday. 03/03/23 02/02/24  Hope Almarie LELON, NP  budeson-glycopyrrolate -formoterol  (BREZTRI  AEROSPHERE) 160-9-4.8 MCG/ACT AERO Inhale 2 puffs into the lungs every 12 (twelve) hours. 03/30/23   Hope Almarie LELON, NP  buPROPion  (WELLBUTRIN  XL) 300 MG 24 hr tablet TAKE 1 TABLET BY MOUTH DAILY 06/07/23   Jodie Lavern CROME, MD  Calcium  500 MG CHEW Chew 500 mg by mouth 2 (two) times daily.    [provider]  carbidopa -levodopa  (SINEMET  IR) 25-100  MG tablet Take 1 tablet by mouth 3 (three) times daily. 02/03/23   Leigh Venetia CROME, MD  Cholecalciferol  250 MCG (10000 UT) CAPS Take 500 capsules by mouth daily. 10/24/18   [provider]  Cyanocobalamin  (VITAMIN B 12 PO) Take by mouth.    [provider]  furosemide  (LASIX ) 20 MG tablet TAKE 1 TABLET BY MOUTH DAILY AS NEEDED FOR EDEMA 07/07/23   [name], MD  ibandronate  (BONIVA ) 150 MG tablet TAKE 1 TABLET BY MOUTH EACH MONTH WITH 8oz OF plain WATER 60 mins BEFORE first food, drink, OR meds. stay upright FOR 60 MINUTES. 08/25/22   Webb, Padonda B, FNP  levothyroxine  (SYNTHROID ) 100 MCG tablet Take 1 tablet (100 mcg total) by mouth daily. 03/10/23   Jodie Lavern CROME, MD  metoprolol  succinate (TOPROL -XL) 25 MG 24 hr tablet Take 1 tablet (25 mg total) by mouth daily. 03/01/23   [name], MD  omeprazole  (PRILOSEC) 20 MG capsule TAKE 1 CAPSULE BY MOUTH EVERY DAY 01/20/23   Jodie Lavern CROME, MD  oxyCODONE  (ROXICODONE ) 5 MG immediate release tablet Take 0.5 tablets (2.5 mg total) by mouth every 6 (six) hours as needed for severe pain (pain score 7-10). 07/21/23   Silver Wonda LABOR, PA  Polyethyl Glycol-Propyl Glycol (SYSTANE OP) Place 1 drop into both eyes as needed.    [provider]  potassium chloride  (KLOR-CON  M) 10 MEQ tablet Take 1 tablet (10 mEq total) by mouth daily as needed (when you take the lasix ). 03/01/23   [name], MD   predniSONE  (DELTASONE ) 10 MG tablet 4 tabs for 2 days, then 3 tabs for 2 days, 2 tabs for 2 days, then 1 tab for 2 days, then stop 04/21/23   [name], NP  rosuvastatin  (CRESTOR ) 10 MG tablet Take 1 tablet (10 mg total) by mouth at bedtime. 03/01/23   [name], MD  tiZANidine  (ZANAFLEX ) 4 MG tablet TAKE 1/2 TO 1 TABLET BY MOUTH EVERY 6 HOURS AS NEEDED FOR MUSCLE SPASMS 09/16/22   Jodie Lavern CROME, MD  valsartan  (DIOVAN ) 40 MG tablet Take 1 tablet (40 mg total) by mouth daily. 03/01/23   [name], MD  vitamin C (ASCORBIC ACID) 250 MG tablet Take 500 mg by mouth daily.    [provider]    Physical Exam: BP (!) 147/97   Pulse 100   Temp (!) 97.5 F (36.4 C) (Oral)   Resp 20   Ht 5' 6.5 (1.689 m)   Wt 76.4 kg   SpO2 99%   BMI 26.78 kg/m  General:  Alert, oriented, calm, in no acute distress, wearing 2 L nasal cannula oxygen , speaking in full sentences, without any dyspnea or cough.  She looks comfortable. Cardiovascular: RRR, no murmurs or rubs, no peripheral edema  Respiratory: Breath sounds are diminished globally, but clear without rhonchi, wheezing or tachypnea Abdomen: soft, nontender, nondistended, normal bowel tones heard  Skin: dry, no rashes  Musculoskeletal: no joint effusions, normal range of motion  Psychiatric: appropriate affect, normal speech  Neurologic: extraocular muscles intact, clear speech, moving all extremities with intact sensorium         Labs on Admission:  Basic Metabolic Panel: Recent Labs  Lab 07/22/23 1813 07/22/23 2334  NA 135 135  K 4.3 4.4  CL 99  --   CO2 21*  --   GLUCOSE 105*  --   BUN 24*  --   CREATININE 1.12*  --   CALCIUM  9.1  --  Liver Function Tests: No results for input(s): AST, ALT, ALKPHOS, BILITOT, PROT, ALBUMIN in the last 168 hours. No results for input(s): LIPASE, AMYLASE in the last 168 hours. No results for input(s): AMMONIA in the last 168  hours. CBC: Recent Labs  Lab 07/22/23 1813 07/22/23 2334  WBC 10.2  --   HGB 11.9* 11.9*  HCT 36.4 35.0*  MCV 95.8  --   PLT 169  --    Cardiac Enzymes: No results for input(s): CKTOTAL, CKMB, CKMBINDEX, TROPONINI in the last 168 hours. BNP (last 3 results) No results for input(s): BNP in the last 8760 hours.  ProBNP (last 3 results) Recent Labs    07/22/23 1811  PROBNP 198.0    CBG: No results for input(s): GLUCAP in the last 168 hours.  Radiological Exams on Admission: CT Angio Chest PE W and/or Wo Contrast Result Date: 07/22/2023 CLINICAL DATA:  Positive D-dimer and shortness of breath EXAM: CT ANGIOGRAPHY CHEST WITH CONTRAST TECHNIQUE: Multidetector CT imaging of the chest was performed using the standard protocol during bolus administration of intravenous contrast. Multiplanar CT image reconstructions and MIPs were obtained to evaluate the vascular anatomy. RADIATION DOSE REDUCTION: This exam was performed according to the departmental dose-optimization program which includes automated exposure control, adjustment of the mA and/or kV according to patient size and/or use of iterative reconstruction technique. CONTRAST:  75mL OMNIPAQUE  IOHEXOL  350 MG/ML SOLN COMPARISON:  Chest x-ray from earlier in the same day. FINDINGS: Cardiovascular: Atherosclerotic calcifications of the thoracic aorta are noted. No aneurysmal dilatation or dissection is noted. Heart is not significantly enlarged. Pulmonary artery is well visualized within normal branching pattern bilaterally. No filling defect to suggest pulmonary embolism is noted. No significant coronary calcifications are noted. Mediastinum/Nodes: Thoracic inlet is within normal limits. No hilar or mediastinal adenopathy is noted. The esophagus as visualized is within normal limits. Lungs/Pleura: Emphysematous changes of the lower lungs are seen. No focal infiltrate or sizable effusion is noted. Scarring in the right apex is again  identified. No other focal abnormality is noted. Upper Abdomen: Gallbladder has been surgically removed. Musculoskeletal: Degenerative changes of the thoracic spine are noted. Review of the MIP images confirms the above findings. IMPRESSION: No evidence of pulmonary emboli. No acute abnormality noted. Aortic Atherosclerosis (ICD10-I70.0) and Emphysema (ICD10-J43.9). Electronically Signed   By: Oneil Devonshire M.D.   On: 07/22/2023 22:40   DG Chest 2 View Result Date: 07/22/2023 EXAM: 2 VIEW(S) XRAY OF THE CHEST 07/22/2023 06:23:00 PM COMPARISON: Dry 16 2025 CLINICAL HISTORY: SOB. Was seen yesterday for same started she felt SOB this afternoon and it got worse O2 sat down to 82 at home RA 95 now. States her feet are swelling now. FINDINGS: LUNGS AND PLEURA: No focal pulmonary opacity. No pulmonary edema. No pleural effusion. No pneumothorax. Hyperinflation and chronic bronchitic change. HEART AND MEDIASTINUM: No acute abnormality of the cardiac and mediastinal silhouettes. BONES AND SOFT TISSUES: No acute osseous abnormality. IMPRESSION: 1. No acute process. Hyperinflation. Electronically signed by: Norman Gatlin MD 07/22/2023 06:55 PM EDT RP Workstation: [id number]   DG Sacrum/Coccyx Result Date: 07/21/2023 CLINICAL DATA:  Pain after fall EXAM: SACRUM AND COCCYX - 2+ VIEW COMPARISON:  05/11/2023 FINDINGS: Bilateral hip replacements. Patent SI joints. No definite acute displaced fracture is seen. IMPRESSION: No definite acute osseous abnormality. Bilateral hip replacements. Cross-sectional imaging follow-up if high suspicion for fracture. Electronically Signed   By: [name] Bun M.D.   On: 07/21/2023 16:52   CT Cervical Spine Wo Contrast Result Date:  07/21/2023 CLINICAL DATA:  Neck trauma EXAM: CT CERVICAL SPINE WITHOUT CONTRAST TECHNIQUE: Multidetector CT imaging of the cervical spine was performed without intravenous contrast. Multiplanar CT image reconstructions were also generated. RADIATION DOSE  REDUCTION: This exam was performed according to the departmental dose-optimization program which includes automated exposure control, adjustment of the mA and/or kV according to patient size and/or use of iterative reconstruction technique. COMPARISON:  CT 10/16/2021 FINDINGS: Alignment: No subluxation.  Facet alignment is within normal limits. Skull base and vertebrae: No acute fracture. No primary bone lesion or focal pathologic process. Soft tissues and spinal canal: No prevertebral fluid or swelling. No visible canal hematoma. Disc levels: Multilevel degenerative changes. Moderate disc space narrowing C5-C6 and C6-C7. Multilevel hypertrophic facet degenerative changes with foraminal narrowing. Upper chest: Apical emphysema and scarring Other: None IMPRESSION: Degenerative changes without acute osseous abnormality. Electronically Signed   By: [name] Bun M.D.   On: 07/21/2023 16:48   CT Head Wo Contrast Result Date: 07/21/2023 CLINICAL DATA:  Head trauma EXAM: CT HEAD WITHOUT CONTRAST TECHNIQUE: Contiguous axial images were obtained from the base of the skull through the vertex without intravenous contrast. RADIATION DOSE REDUCTION: This exam was performed according to the departmental dose-optimization program which includes automated exposure control, adjustment of the mA and/or kV according to patient size and/or use of iterative reconstruction technique. COMPARISON:  Head CT 01/15/2022, MRI 01/15/2022 FINDINGS: Brain: No acute territorial infarction, hemorrhage, or intracranial mass. Atrophy and mild chronic small vessel ischemic changes of the white matter. Stable ventricle size. Vascular: No hyperdense vessels.  Carotid vascular calcification. Skull: Normal. Negative for fracture or focal lesion. Sinuses/Orbits: No acute finding. Other: Left posterior scalp hematoma IMPRESSION: 1. No CT evidence for acute intracranial abnormality. 2. Atrophy and mild chronic small vessel ischemic changes of the white  matter. Left posterior scalp hematoma. Electronically Signed   By: [name] Bun M.D.   On: 07/21/2023 16:45   DG Thoracic Spine 2 View Result Date: 07/21/2023 CLINICAL DATA:  Fall with back pain EXAM: THORACIC SPINE 2 VIEWS COMPARISON:  Chest CT 12/14/2022 FINDINGS: Thoracic alignment is within normal limits. Vertebral body heights are maintained. Moderate multilevel degenerative osteophyte of the mid to lower thoracic spine. IMPRESSION: No acute osseous abnormality. Electronically Signed   By: [name] Bun M.D.   On: 07/21/2023 16:41   DG Chest 1 View Result Date: 07/21/2023 CLINICAL DATA:  Pain EXAM: CHEST  1 VIEW COMPARISON:  None Available. FINDINGS: Hyperinflation. No consolidation, pneumothorax or effusion. No edema. Normal cardiopericardial silhouette. Fixation hardware along the left shoulder for arthroplasty at the edge of the imaging field. Degenerative changes of the spine. IMPRESSION: Hyperinflation with chronic changes. No acute cardiopulmonary disease. Electronically Signed   By: Ranell Bring M.D.   On: 07/21/2023 16:39   Assessment/Plan KADEE PHILYAW is a 68 y.o. female with medical history significant for COPD on room air, Parkinson's disease, CKD, nonischemic cardiomyopathy with preserved EF being admitted to the hospital with 3 weeks of cough, shortness of breath, dyspnea with exertion found to have acute hypoxic respiratory failure due to acute exacerbation of COPD.  Acute exacerbation of COPD-with wheezing, dyspnea, productive cough.  No fevers, or consolidation to indicate bacterial infection. -Observation admission -Breztri  twice daily, with as needed albuterol  -IV Solu-Medrol  twice daily -Incentive spirometer and flutter valve -No indication for antibiotics at the moment  Acute hypoxic respiratory failure-due to acute exacerbation of COPD as above. -Continue supplemental oxygen  -Wean as tolerated for goal O2 saturation greater than 89%  Parkinsonism-continue  Sinemet   CKD stage III-renal function appears to be at baseline  Hypothyroidism-Synthroid   Depression-Wellbutrin , Zoloft  Hypertension-continue home Diovan   DVT prophylaxis: Lovenox     Code Status: Full Code  Consults called: None  Admission status: Observation  Time spent: 49 minutes  Mykeisha Dysert CHRISTELLA Gail MD [facility] Pager [phone]  If 7PM-7AM, please contact night-coverage [email or web address] Password [facility]  07/23/2023, 8:45 AM

## 2023-07-23 NOTE — Progress Notes (Signed)
   07/23/23 1304  Spiritual Encounters  Type of Visit Initial  Care provided to: Pt and family  Reason for visit Advance directives  OnCall Visit No   Visited with patient in response to request for AD and prayer. Patient had friend at bedside during visit. Patient stated she doesn't want to complete a HCPOA at this time and that she currently has one in place with her attorneys. Provided other spiritual care needs.

## 2023-07-24 ENCOUNTER — Observation Stay (HOSPITAL_COMMUNITY)

## 2023-07-24 DIAGNOSIS — S0181XA Laceration without foreign body of other part of head, initial encounter: Secondary | ICD-10-CM | POA: Diagnosis present

## 2023-07-24 DIAGNOSIS — Z7401 Bed confinement status: Secondary | ICD-10-CM | POA: Diagnosis not present

## 2023-07-24 DIAGNOSIS — D631 Anemia in chronic kidney disease: Secondary | ICD-10-CM | POA: Diagnosis present

## 2023-07-24 DIAGNOSIS — R2681 Unsteadiness on feet: Secondary | ICD-10-CM | POA: Diagnosis not present

## 2023-07-24 DIAGNOSIS — Z96643 Presence of artificial hip joint, bilateral: Secondary | ICD-10-CM | POA: Diagnosis present

## 2023-07-24 DIAGNOSIS — E559 Vitamin D deficiency, unspecified: Secondary | ICD-10-CM | POA: Diagnosis not present

## 2023-07-24 DIAGNOSIS — R9431 Abnormal electrocardiogram [ECG] [EKG]: Secondary | ICD-10-CM | POA: Diagnosis not present

## 2023-07-24 DIAGNOSIS — I130 Hypertensive heart and chronic kidney disease with heart failure and stage 1 through stage 4 chronic kidney disease, or unspecified chronic kidney disease: Secondary | ICD-10-CM | POA: Diagnosis not present

## 2023-07-24 DIAGNOSIS — I428 Other cardiomyopathies: Secondary | ICD-10-CM | POA: Diagnosis present

## 2023-07-24 DIAGNOSIS — Z90710 Acquired absence of both cervix and uterus: Secondary | ICD-10-CM | POA: Diagnosis not present

## 2023-07-24 DIAGNOSIS — I509 Heart failure, unspecified: Secondary | ICD-10-CM | POA: Diagnosis present

## 2023-07-24 DIAGNOSIS — J449 Chronic obstructive pulmonary disease, unspecified: Secondary | ICD-10-CM | POA: Diagnosis not present

## 2023-07-24 DIAGNOSIS — G4700 Insomnia, unspecified: Secondary | ICD-10-CM | POA: Diagnosis not present

## 2023-07-24 DIAGNOSIS — R1312 Dysphagia, oropharyngeal phase: Secondary | ICD-10-CM | POA: Diagnosis not present

## 2023-07-24 DIAGNOSIS — Z79899 Other long term (current) drug therapy: Secondary | ICD-10-CM | POA: Diagnosis not present

## 2023-07-24 DIAGNOSIS — M6281 Muscle weakness (generalized): Secondary | ICD-10-CM | POA: Diagnosis not present

## 2023-07-24 DIAGNOSIS — E039 Hypothyroidism, unspecified: Secondary | ICD-10-CM | POA: Diagnosis not present

## 2023-07-24 DIAGNOSIS — E785 Hyperlipidemia, unspecified: Secondary | ICD-10-CM | POA: Diagnosis present

## 2023-07-24 DIAGNOSIS — J9601 Acute respiratory failure with hypoxia: Secondary | ICD-10-CM | POA: Diagnosis not present

## 2023-07-24 DIAGNOSIS — Z8249 Family history of ischemic heart disease and other diseases of the circulatory system: Secondary | ICD-10-CM | POA: Diagnosis not present

## 2023-07-24 DIAGNOSIS — R0602 Shortness of breath: Secondary | ICD-10-CM | POA: Diagnosis not present

## 2023-07-24 DIAGNOSIS — I2510 Atherosclerotic heart disease of native coronary artery without angina pectoris: Secondary | ICD-10-CM | POA: Diagnosis present

## 2023-07-24 DIAGNOSIS — Z23 Encounter for immunization: Secondary | ICD-10-CM | POA: Diagnosis present

## 2023-07-24 DIAGNOSIS — R062 Wheezing: Secondary | ICD-10-CM | POA: Diagnosis not present

## 2023-07-24 DIAGNOSIS — R600 Localized edema: Secondary | ICD-10-CM | POA: Diagnosis not present

## 2023-07-24 DIAGNOSIS — D513 Other dietary vitamin B12 deficiency anemia: Secondary | ICD-10-CM | POA: Diagnosis not present

## 2023-07-24 DIAGNOSIS — J441 Chronic obstructive pulmonary disease with (acute) exacerbation: Secondary | ICD-10-CM | POA: Diagnosis not present

## 2023-07-24 DIAGNOSIS — Z79890 Hormone replacement therapy (postmenopausal): Secondary | ICD-10-CM | POA: Diagnosis not present

## 2023-07-24 DIAGNOSIS — E782 Mixed hyperlipidemia: Secondary | ICD-10-CM | POA: Diagnosis not present

## 2023-07-24 DIAGNOSIS — Z96612 Presence of left artificial shoulder joint: Secondary | ICD-10-CM | POA: Diagnosis present

## 2023-07-24 DIAGNOSIS — M353 Polymyalgia rheumatica: Secondary | ICD-10-CM | POA: Diagnosis not present

## 2023-07-24 DIAGNOSIS — I5032 Chronic diastolic (congestive) heart failure: Secondary | ICD-10-CM | POA: Diagnosis not present

## 2023-07-24 DIAGNOSIS — I110 Hypertensive heart disease with heart failure: Secondary | ICD-10-CM | POA: Diagnosis not present

## 2023-07-24 DIAGNOSIS — Z87891 Personal history of nicotine dependence: Secondary | ICD-10-CM | POA: Diagnosis not present

## 2023-07-24 DIAGNOSIS — N1831 Chronic kidney disease, stage 3a: Secondary | ICD-10-CM | POA: Diagnosis not present

## 2023-07-24 DIAGNOSIS — J439 Emphysema, unspecified: Secondary | ICD-10-CM | POA: Diagnosis present

## 2023-07-24 DIAGNOSIS — R296 Repeated falls: Secondary | ICD-10-CM | POA: Diagnosis not present

## 2023-07-24 DIAGNOSIS — G20A1 Parkinson's disease without dyskinesia, without mention of fluctuations: Secondary | ICD-10-CM | POA: Diagnosis present

## 2023-07-24 DIAGNOSIS — R531 Weakness: Secondary | ICD-10-CM | POA: Diagnosis not present

## 2023-07-24 DIAGNOSIS — R52 Pain, unspecified: Secondary | ICD-10-CM | POA: Diagnosis not present

## 2023-07-24 DIAGNOSIS — J45909 Unspecified asthma, uncomplicated: Secondary | ICD-10-CM | POA: Diagnosis not present

## 2023-07-24 DIAGNOSIS — D72829 Elevated white blood cell count, unspecified: Secondary | ICD-10-CM | POA: Diagnosis not present

## 2023-07-24 DIAGNOSIS — E54 Ascorbic acid deficiency: Secondary | ICD-10-CM | POA: Diagnosis not present

## 2023-07-24 DIAGNOSIS — F32A Depression, unspecified: Secondary | ICD-10-CM | POA: Diagnosis present

## 2023-07-24 DIAGNOSIS — N179 Acute kidney failure, unspecified: Secondary | ICD-10-CM | POA: Diagnosis not present

## 2023-07-24 DIAGNOSIS — W19XXXA Unspecified fall, initial encounter: Secondary | ICD-10-CM | POA: Diagnosis present

## 2023-07-24 DIAGNOSIS — Z743 Need for continuous supervision: Secondary | ICD-10-CM | POA: Diagnosis not present

## 2023-07-24 DIAGNOSIS — G20B2 Parkinson's disease with dyskinesia, with fluctuations: Secondary | ICD-10-CM | POA: Diagnosis not present

## 2023-07-24 DIAGNOSIS — R053 Chronic cough: Secondary | ICD-10-CM | POA: Diagnosis not present

## 2023-07-24 LAB — ECHOCARDIOGRAM COMPLETE
Area-P 1/2: 4.96 cm2
Height: 66.5 in
S' Lateral: 3.2 cm
Weight: 2694.9 [oz_av]

## 2023-07-24 LAB — BASIC METABOLIC PANEL WITH GFR
Anion gap: 7 (ref 5–15)
BUN: 27 mg/dL — ABNORMAL HIGH (ref 8–23)
CO2: 24 mmol/L (ref 22–32)
Calcium: 8.8 mg/dL — ABNORMAL LOW (ref 8.9–10.3)
Chloride: 102 mmol/L (ref 98–111)
Creatinine, Ser: 0.99 mg/dL (ref 0.44–1.00)
GFR, Estimated: >60 mL/min (ref >60)
Glucose, Bld: 162 mg/dL — ABNORMAL HIGH (ref 70–99)
Potassium: 4.3 mmol/L (ref 3.5–5.1)
Sodium: 133 mmol/L — ABNORMAL LOW (ref 135–145)

## 2023-07-24 LAB — CBC
HCT: 33.9 % — ABNORMAL LOW (ref 36.0–46.0)
Hemoglobin: 10.8 g/dL — ABNORMAL LOW (ref 12.0–15.0)
MCH: 31.3 pg (ref 26.0–34.0)
MCHC: 31.9 g/dL (ref 30.0–36.0)
MCV: 98.3 fL (ref 80.0–100.0)
Platelets: 156 K/uL (ref 150–400)
RBC: 3.45 MIL/uL — ABNORMAL LOW (ref 3.87–5.11)
RDW: 13.5 % (ref 11.5–15.5)
WBC: 14.1 K/uL — ABNORMAL HIGH (ref 4.0–10.5)
nRBC: 0 % (ref 0.0–0.2)

## 2023-07-24 LAB — BRAIN NATRIURETIC PEPTIDE: B Natriuretic Peptide: 68.4 pg/mL (ref 0.0–100.0)

## 2023-07-24 LAB — TSH: TSH: 2.111 u[IU]/mL (ref 0.350–4.500)

## 2023-07-24 MED ORDER — SERTRALINE HCL 50 MG PO TABS
50.0000 mg | ORAL_TABLET | Freq: Every day | ORAL | Status: DC
  Administered 2023-07-24 – 2023-07-27 (×4): 50 mg via ORAL
  Filled 2023-07-24 (×4): qty 1

## 2023-07-24 MED ORDER — LEVALBUTEROL HCL 0.63 MG/3ML IN NEBU
0.6300 mg | INHALATION_SOLUTION | Freq: Two times a day (BID) | RESPIRATORY_TRACT | Status: DC
  Administered 2023-07-24 – 2023-07-28 (×8): 0.63 mg via RESPIRATORY_TRACT
  Filled 2023-07-24 (×8): qty 3

## 2023-07-24 MED ORDER — ROSUVASTATIN CALCIUM 10 MG PO TABS
10.0000 mg | ORAL_TABLET | Freq: Every day | ORAL | Status: DC
  Administered 2023-07-24 – 2023-07-27 (×4): 10 mg via ORAL
  Filled 2023-07-24 (×4): qty 1

## 2023-07-24 MED ORDER — CARBIDOPA-LEVODOPA 25-100 MG PO TABS
1.0000 | ORAL_TABLET | Freq: Three times a day (TID) | ORAL | Status: DC
  Administered 2023-07-24 – 2023-07-28 (×13): 1 via ORAL
  Filled 2023-07-24 (×13): qty 1

## 2023-07-24 MED ORDER — LEVALBUTEROL HCL 0.63 MG/3ML IN NEBU
0.6300 mg | INHALATION_SOLUTION | Freq: Four times a day (QID) | RESPIRATORY_TRACT | Status: DC
  Administered 2023-07-24: 0.63 mg via RESPIRATORY_TRACT
  Filled 2023-07-24: qty 3

## 2023-07-24 NOTE — Progress Notes (Signed)
  Echocardiogram 2D Echocardiogram has been performed.  Koleen KANDICE Popper, RDCS 07/24/2023, 1:34 PM

## 2023-07-24 NOTE — Evaluation (Signed)
 Occupational Therapy Evaluation Patient Details Name: April [name] MRN: [id number] DOB: [birth year] Today's Date: 07/24/2023   History of Present Illness   April [name] is a 68 y.o. F admitted 07/22/23 due to 3 weeks of cough, shortness of breath, dyspnea with exertion found to have acute hypoxic respiratory failure due to acute exacerbation of COPD. PMH includes COPD on room air, Parkinson's disease, CKD, nonischemic cardiomyopathy with preserved EF, bil TSA, Bil THA, L TKA,     Clinical Impressions Pt lives at an ILF where she has supervision for bathing (2x/week) and otherwise is independent with her rollator. She does report increased falls recently. Today she is pleasant, very motivated and cooperative and overall min A for transfers and mobility with RW. 1 LOB with ambulation that required therapist intervention. She cannot access her LB for ADL due to soreness in bottom (X rays from 7/16 No definite acute osseous abnormality) but she reports pain from sacrum/coccyx. She is overall mod A for LB ADL at this time. She also is DOE (2/4), requiring NEW supplemental O2 to maintain SpO2 >90%. At this time recommending continued OT in the acute setting as well as afterwards at rehab of <3 hours daily to maximize safety and independence in ADL and functional transfers - next session focus on energy conservation education and continued OOB. Potentially AE for LB.     If plan is discharge home, recommend the following:   A little help with walking and/or transfers;A lot of help with bathing/dressing/bathroom;Assistance with cooking/housework;Assist for transportation;Help with stairs or ramp for entrance     Functional Status Assessment   Patient has had a recent decline in their functional status and demonstrates the ability to make significant improvements in function in a reasonable and predictable amount of time.     Equipment Recommendations   Other (comment) (defer to next venue of  care)     Recommendations for Other Services   PT consult     Precautions/Restrictions   Precautions Recall of Precautions/Restrictions: Intact Precaution/Restrictions Comments: watch SpO2 Restrictions Weight Bearing Restrictions Per Provider Order: No     Mobility Bed Mobility Overal bed mobility: Needs Assistance Bed Mobility: Supine to Sit     Supine to sit: Contact guard, Used rails, HOB elevated     General bed mobility comments: increased time and use of rails    Transfers Overall transfer level: Needs assistance Equipment used: Rolling walker (2 wheels) Transfers: Sit to/from Stand Sit to Stand: Min assist           General transfer comment: steadying assist, vc for safe hand placement, A for boost      Balance Overall balance assessment: Needs assistance Sitting-balance support: Single extremity supported, Feet supported Sitting balance-Leahy Scale: Fair Sitting balance - Comments: unchallenged   Standing balance support: Bilateral upper extremity supported, Reliant on assistive device for balance Standing balance-Leahy Scale: Poor                             ADL either performed or assessed with clinical judgement   ADL Overall ADL's : Needs assistance/impaired Eating/Feeding: Independent   Grooming: Contact guard assist;Wash/dry face;Brushing hair;Standing Grooming Details (indicate cue type and reason): leans against sink for balance Upper Body Bathing: Contact guard assist;Sitting   Lower Body Bathing: Moderate assistance;Sitting/lateral leans Lower Body Bathing Details (indicate cue type and reason): knees down - will benefit from AE education Upper Body Dressing : Contact guard assist  Lower Body Dressing: Moderate assistance   Toilet Transfer: Minimal assistance;Ambulation;Rolling walker (2 wheels) Toilet Transfer Details (indicate cue type and reason): 1 LOB with ambulation Toileting- Clothing Manipulation and  Hygiene: Contact guard assist;Sitting/lateral lean       Functional mobility during ADLs: Minimal assistance;Rolling walker (2 wheels) General ADL Comments: decreased activity tolerance, decreased access to LB for ADL, increased frequency of falls, DOE, new need for supplemental O2     Vision Baseline Vision/History: 1 Wears glasses Ability to See in Adequate Light: 0 Adequate Patient Visual Report: No change from baseline (Pt reports recent changes in dryness of eyes with new Parkinsonian diagnosis and reports that when her eyes get dry they also get blurry) Vision Assessment?: No apparent visual deficits     Perception         Praxis         Pertinent Vitals/Pain Pain Assessment Pain Assessment: Faces Faces Pain Scale: Hurts a little bit Pain Location: head/ichial tuberosity Pain Descriptors / Indicators: Discomfort, Sore, Headache Pain Intervention(s): Monitored during session, Repositioned, Other (comment) (specialized cushion)     Extremity/Trunk Assessment Upper Extremity Assessment Upper Extremity Assessment: Generalized weakness   Lower Extremity Assessment Lower Extremity Assessment: Generalized weakness   Cervical / Trunk Assessment Cervical / Trunk Assessment: Kyphotic   Communication Communication Communication: No apparent difficulties   Cognition Arousal: Alert Behavior During Therapy: WFL for tasks assessed/performed Cognition: No apparent impairments                               Following commands: Intact       Cueing  General Comments   Cueing Techniques: Verbal cues      Exercises     Shoulder Instructions      Home Living Family/patient expects to be discharged to:: Private residence Living Arrangements: Alone Available Help at Discharge: Personal care attendant Type of Home: Apartment Home Access: Elevator     Home Layout: One level     Bathroom Shower/Tub: Producer, television/film/video:   (comfort) Bathroom Accessibility: Yes How Accessible: Accessible via walker Home Equipment: Shower seat - built in;Grab bars - tub/shower;Grab bars - toilet;Hand held shower head;Rollator (4 wheels)   Additional Comments: independent living facility      Prior Functioning/Environment Prior Level of Function : Independent/Modified Independent;History of Falls (last six months)             Mobility Comments: uses Rollator ADLs Comments: independent, but supervision for bathing/safety    OT Problem List: Decreased strength;Decreased activity tolerance;Impaired balance (sitting and/or standing);Decreased safety awareness;Decreased knowledge of use of DME or AE;Pain   OT Treatment/Interventions: Self-care/ADL training;Neuromuscular education;Energy conservation;DME and/or AE instruction;Therapeutic activities;Patient/family education;Balance training      OT Goals(Current goals can be found in the care plan section)   Acute Rehab OT Goals Patient Stated Goal: get as independent as possible prior to return to ILF OT Goal Formulation: With patient Time For Goal Achievement: 08/07/23 Potential to Achieve Goals: Good ADL Goals Pt Will Perform Grooming: with modified independence;standing Pt Will Perform Upper Body Dressing: with modified independence;sitting Pt Will Perform Lower Body Dressing: with modified independence;sit to/from stand;with adaptive equipment Pt Will Transfer to Toilet: with modified independence;ambulating Pt Will Perform Toileting - Clothing Manipulation and hygiene: with modified independence;sitting/lateral leans Additional ADL Goal #1: Pt will verbalize at least 3 energy conservation strategies to implement during ADL with no cues   OT Frequency:  Min  2X/week    Co-evaluation PT/OT/SLP Co-Evaluation/Treatment: Yes Reason for Co-Treatment: For patient/therapist safety;To address functional/ADL transfers PT goals addressed during session: Mobility/safety  with mobility;Balance;Proper use of DME;Strengthening/ROM OT goals addressed during session: ADL's and self-care;Proper use of Adaptive equipment and DME;Strengthening/ROM      AM-PAC OT 6 Clicks Daily Activity     Outcome Measure Help from another person eating meals?: None Help from another person taking care of personal grooming?: A Little Help from another person toileting, which includes using toliet, bedpan, or urinal?: A Little Help from another person bathing (including washing, rinsing, drying)?: A Lot Help from another person to put on and taking off regular upper body clothing?: A Little Help from another person to put on and taking off regular lower body clothing?: A Lot 6 Click Score: 17   End of Session Equipment Utilized During Treatment: Gait belt;Rolling walker (2 wheels);Oxygen  (2-3L) Nurse Communication: Mobility status;Precautions  Activity Tolerance: Patient tolerated treatment well Patient left: in chair;with call bell/phone within reach;with chair alarm set  OT Visit Diagnosis: Unsteadiness on feet (R26.81);Other abnormalities of gait and mobility (R26.89);History of falling (Z91.81);Muscle weakness (generalized) (M62.81);Pain;Other symptoms and signs involving the nervous system (R29.898) Pain - Right/Left:  (central) Pain - part of body:  (head, buttocks)                Time: 8869-8798 OT Time Calculation (min): 31 min Charges:  OT General Charges $OT Visit: 1 Visit OT Evaluation $OT Eval Moderate Complexity: 1 Mod  Leita DEL OTR/L Acute Rehabilitation Services Office: [phone]  Leita JINNY Odea 07/24/2023, 1:51 PM

## 2023-07-24 NOTE — Evaluation (Signed)
 Physical Therapy Evaluation Patient Details Name: April [name] MRN: [id number] DOB: [birth year] Today's Date: 07/24/2023  History of Present Illness  April [name] is a 68 y.o. F admitted 07/22/23 due to 3 weeks of cough, shortness of breath, dyspnea with exertion found to have acute hypoxic respiratory failure due to acute exacerbation of COPD. PMH includes COPD on room air, Parkinson's disease, CKD, nonischemic cardiomyopathy with preserved EF, bil TSA, Bil THA, L TKA,  Clinical Impression  Pt admitted as above and presenting with functional mobility limitations 2* generalized weakness, ambulatory balance deficits, and decreased activity tolerance.  Patient will benefit from continued inpatient follow up therapy, <3 hours/day to maximize IND and safety prior to return to IND living.         If plan is discharge home, recommend the following: A little help with walking and/or transfers;A little help with bathing/dressing/bathroom;Assistance with cooking/housework;Assist for transportation;Help with stairs or ramp for entrance   Can travel by private vehicle        Equipment Recommendations None recommended by PT  Recommendations for Other Services       Functional Status Assessment Patient has had a recent decline in their functional status and demonstrates the ability to make significant improvements in function in a reasonable and predictable amount of time.     Precautions / Restrictions Precautions Recall of Precautions/Restrictions: Intact Precaution/Restrictions Comments: watch SpO2 Restrictions Weight Bearing Restrictions Per Provider Order: No      Mobility  Bed Mobility Overal bed mobility: Needs Assistance Bed Mobility: Supine to Sit     Supine to sit: Contact guard     General bed mobility comments: Increased time, use of bed rails    Transfers Overall transfer level: Needs assistance Equipment used: Rolling walker (2 wheels) Transfers: Sit to/from  Stand Sit to Stand: Min assist           General transfer comment: Steady assist with cues for LE management and use of UEs to self assist    Ambulation/Gait Ambulation/Gait assistance: Min assist Gait Distance (Feet): 75 Feet Assistive device: Rolling walker (2 wheels) Gait Pattern/deviations: Step-to pattern, Step-through pattern, Decreased step length - right, Decreased step length - left, Shuffle, Trunk flexed, Narrow base of support       General Gait Details: Increased time with standing rest breaks and cues for posture, position from RW and to increase BOS for stability.  Pt with one episode balance loss requiring min assist to stabilize  Stairs            Wheelchair Mobility     Tilt Bed    Modified Rankin (Stroke Patients Only)       Balance Overall balance assessment: Needs assistance Sitting-balance support: No upper extremity supported, Feet supported Sitting balance-Leahy Scale: Good     Standing balance support: Bilateral upper extremity supported Standing balance-Leahy Scale: Poor                               Pertinent Vitals/Pain Pain Assessment Pain Assessment: Faces Faces Pain Scale: Hurts a little bit Pain Location: head/ichial tuberosity Pain Descriptors / Indicators: Discomfort, Sore, Headache Pain Intervention(s): Limited activity within patient's tolerance, Monitored during session    Home Living Family/patient expects to be discharged to:: Private residence Living Arrangements: Alone Available Help at Discharge: Personal care attendant Type of Home: Apartment Home Access: Elevator       Home Layout: One level Home Equipment: Shower seat -  built in;Grab bars - tub/shower;Grab bars - toilet;Hand held shower head;Rollator (4 wheels) Additional Comments: independent living facility    Prior Function Prior Level of Function : Independent/Modified Independent;History of Falls (last six months)              Mobility Comments: uses Rollator ADLs Comments: independent, but supervision for bathing/safety     Extremity/Trunk Assessment   Upper Extremity Assessment Upper Extremity Assessment: Generalized weakness    Lower Extremity Assessment Lower Extremity Assessment: Generalized weakness    Cervical / Trunk Assessment Cervical / Trunk Assessment: Kyphotic  Communication   Communication Communication: No apparent difficulties    Cognition Arousal: Alert Behavior During Therapy: WFL for tasks assessed/performed   PT - Cognitive impairments: No apparent impairments                         Following commands: Intact       Cueing Cueing Techniques: Verbal cues     General Comments      Exercises     Assessment/Plan    PT Assessment Patient needs continued PT services  PT Problem List Decreased strength;Decreased range of motion;Decreased activity tolerance;Decreased balance;Decreased mobility;Decreased knowledge of use of DME;Pain       PT Treatment Interventions DME instruction;Gait training;Stair training;Functional mobility training;Therapeutic activities;Therapeutic exercise;Patient/family education;Balance training    PT Goals (Current goals can be found in the Care Plan section)  Acute Rehab PT Goals Patient Stated Goal: Regain IND PT Goal Formulation: With patient Time For Goal Achievement: 08/06/23 Potential to Achieve Goals: Good    Frequency 7X/week     Co-evaluation PT/OT/SLP Co-Evaluation/Treatment: Yes Reason for Co-Treatment: For patient/therapist safety;To address functional/ADL transfers PT goals addressed during session: Mobility/safety with mobility;Balance;Proper use of DME;Strengthening/ROM OT goals addressed during session: ADL's and self-care;Proper use of Adaptive equipment and DME;Strengthening/ROM       AM-PAC PT 6 Clicks Mobility  Outcome Measure Help needed turning from your back to your side while in a flat bed  without using bedrails?: A Little Help needed moving from lying on your back to sitting on the side of a flat bed without using bedrails?: A Little Help needed moving to and from a bed to a chair (including a wheelchair)?: A Little Help needed standing up from a chair using your arms (e.g., wheelchair or bedside chair)?: A Little Help needed to walk in hospital room?: A Little Help needed climbing 3-5 steps with a railing? : A Lot 6 Click Score: 17    End of Session Equipment Utilized During Treatment: Gait belt Activity Tolerance: Patient tolerated treatment well;Patient limited by fatigue Patient left: in chair;with call bell/phone within reach;with chair alarm set Nurse Communication: Mobility status PT Visit Diagnosis: Difficulty in walking, not elsewhere classified (R26.2)    Time: 8866-8843 PT Time Calculation (min) (ACUTE ONLY): 23 min   Charges:   PT Evaluation $PT Eval Low Complexity: 1 Low   PT General Charges $$ ACUTE PT VISIT: 1 Visit         [facility] PT Acute Rehabilitation Services Office [phone]   April [name] 07/24/2023, 1:43 PM

## 2023-07-24 NOTE — Progress Notes (Addendum)
 PROGRESS NOTE    April [name]  FMW:[id number] DOB: 26-Apr-1955 DOA: 07/22/2023 PCP: Tonnie Jon SAILOR, NP  Brief Narrative: 68 y.o. female with medical history significant for COPD on room air, Parkinson's disease, CKD, nonischemic cardiomyopathy with preserved EF being admitted to the hospital with 3 weeks of cough, shortness of breath, dyspnea with exertion found to have acute hypoxic respiratory failure due to acute exacerbation of COPD.  History provided by the patient, who says she is followed by [facility] who in conjunction with her PCP over the last couple of weeks has prescribed her oral steroids and a couple of rounds of antibiotics including amoxicillin  and doxycycline .she also c/o lower ext edema.  States that for the last 3 weeks, she has had progressive dyspnea with exertion, cough productive of copious amount of sputum, and shortness of breath which has progressed to even being at rest.  She has never required supplemental oxygen  at home.  Yesterday before she came to the ER for continued and worsening dyspnea and cough, she noted at home that her pulse ox was reading 82%.  In the emergency department, workup as detailed below has ruled out acute pneumonia, PE, shows evidence of COPD exacerbation.  She received breathing treatments, IV steroids and feels that her breathing is significantly better, though she has been maintained on 2 L of nasal cannula oxygen .   Assessment & Plan:   Principal Problem:   Acute exacerbation of chronic obstructive pulmonary disease (COPD) (HCC)   #1 acute hypoxic respiratory failure due to COPD exacerbation.  She failed outpatient treatments with steroids antibiotics for couple of weeks prior to admission.  CT chest shows no acute findings including infiltrates or acute PE.  Patient was wheezing dyspneic had productive cough and lower extremity edema on admission.  When I saw her this morning she was just coming out of the restroom was winded very  short of breath and dyspneic he was unable to speak in full sentences.  She was on 2 L of oxygen  bumped up to 3 with improvement. Now back on 2 L saturating 95%.  She did not have oxygen  prior to admission. Continue IV Solu-Medrol , breztri  start Xopenex  4 times daily Encourage incentive spirometer and flutter valve Currently not on antibiotics will monitor closely. Since this has been going on for few weeks and now with edema to lower extremities I will get an echocardiogram.  The last echo was done in 2023. BNP added to prior labs. Check ambulatory oxygen  saturation.  Parkinsonism-continue Sinemet    CKD stage III-renal function appears to be at baseline   Hypothyroidism-meds show she is on Synthroid  however she has not been taking it I have added a TSH to prior labs.   May be some proptosis noted  Depression-Wellbutrin , Zoloft    Hypertension-BP soft hold antihypertensives including Diovan .   Leukocytosis likely from steroids no evidence of active infection noted   Estimated body mass index is 26.78 kg/m as calculated from the following:   Height as of this encounter: 5' 6.5 (1.689 m).   Weight as of this encounter: 76.4 kg.  DVT prophylaxis: Lovenox   code Status: Full code  family Communication: None  disposition Plan:  Status is: Observation   Consultants: None  Procedures: None Antimicrobials: None  Subjective: Short winded coming out of the restroom on 2 L of oxygen  anxious  Objective: Vitals:   07/23/23 1407 07/23/23 1827 07/23/23 2006 07/24/23 0534  BP: 128/75 (!) 146/86  117/65  Pulse: (!) 106 97  93  Resp: 20 20  18   Temp: 98.1 F (36.7 C) 98 F (36.7 C)  98.5 F (36.9 C)  TempSrc: Oral Oral  Oral  SpO2: 98% 99% 98% 98%  Weight:      Height:        Intake/Output Summary (Last 24 hours) at 07/24/2023 9177 Last data filed at 07/24/2023 9295 Gross per 24 hour  Intake --  Output 350 ml  Net -350 ml   Filed Weights   07/23/23 0523 07/23/23 0621   Weight: 76.7 kg 76.4 kg    Examination:  General exam: Appears in mild distress Respiratory system: Wheezing tachypneic Cardiovascular system: Regular  gastrointestinal system: Abdomen is nondistended, soft and nontender. No organomegaly or masses felt. Normal bowel sounds heard. Central nervous system: Alert and oriented. No focal neurological deficits. Extremities: 1+ bilateral pitting edema    Data Reviewed: I have personally reviewed following labs and imaging studies  CBC: Recent Labs  Lab 07/22/23 1813 07/22/23 2334 07/24/23 0637  WBC 10.2  --  14.1*  HGB 11.9* 11.9* 10.8*  HCT 36.4 35.0* 33.9*  MCV 95.8  --  98.3  PLT 169  --  156   Basic Metabolic Panel: Recent Labs  Lab 07/22/23 1813 07/22/23 2334 07/24/23 0637  NA 135 135 133*  K 4.3 4.4 4.3  CL 99  --  102  CO2 21*  --  24  GLUCOSE 105*  --  162*  BUN 24*  --  27*  CREATININE 1.12*  --  0.99  CALCIUM  9.1  --  8.8*   GFR: Estimated Creatinine Clearance: 55.8 mL/min (by C-G formula based on SCr of 0.99 mg/dL). Liver Function Tests: No results for input(s): AST, ALT, ALKPHOS, BILITOT, PROT, ALBUMIN in the last 168 hours. No results for input(s): LIPASE, AMYLASE in the last 168 hours. No results for input(s): AMMONIA in the last 168 hours. Coagulation Profile: No results for input(s): INR, PROTIME in the last 168 hours. Cardiac Enzymes: No results for input(s): CKTOTAL, CKMB, CKMBINDEX, TROPONINI in the last 168 hours. BNP (last 3 results) Recent Labs    07/22/23 1811  PROBNP 198.0   HbA1C: No results for input(s): HGBA1C in the last 72 hours. CBG: No results for input(s): GLUCAP in the last 168 hours. Lipid Profile: No results for input(s): CHOL, HDL, LDLCALC, TRIG, CHOLHDL, LDLDIRECT in the last 72 hours. Thyroid  Function Tests: No results for input(s): TSH, T4TOTAL, FREET4, T3FREE, THYROIDAB in the last 72 hours. Anemia Panel: No  results for input(s): VITAMINB12, FOLATE, FERRITIN, TIBC, IRON, RETICCTPCT in the last 72 hours. Sepsis Labs: No results for input(s): PROCALCITON, LATICACIDVEN in the last 168 hours.  No results found for this or any previous visit (from the past 240 hours).       Radiology Studies: CT Angio Chest PE W and/or Wo Contrast Result Date: 07/22/2023 CLINICAL DATA:  Positive D-dimer and shortness of breath EXAM: CT ANGIOGRAPHY CHEST WITH CONTRAST TECHNIQUE: Multidetector CT imaging of the chest was performed using the standard protocol during bolus administration of intravenous contrast. Multiplanar CT image reconstructions and MIPs were obtained to evaluate the vascular anatomy. RADIATION DOSE REDUCTION: This exam was performed according to the departmental dose-optimization program which includes automated exposure control, adjustment of the mA and/or kV according to patient size and/or use of iterative reconstruction technique. CONTRAST:  75mL OMNIPAQUE  IOHEXOL  350 MG/ML SOLN COMPARISON:  Chest x-ray from earlier in the same day. FINDINGS: Cardiovascular: Atherosclerotic calcifications of the thoracic aorta are noted. No aneurysmal  dilatation or dissection is noted. Heart is not significantly enlarged. Pulmonary artery is well visualized within normal branching pattern bilaterally. No filling defect to suggest pulmonary embolism is noted. No significant coronary calcifications are noted. Mediastinum/Nodes: Thoracic inlet is within normal limits. No hilar or mediastinal adenopathy is noted. The esophagus as visualized is within normal limits. Lungs/Pleura: Emphysematous changes of the lower lungs are seen. No focal infiltrate or sizable effusion is noted. Scarring in the right apex is again identified. No other focal abnormality is noted. Upper Abdomen: Gallbladder has been surgically removed. Musculoskeletal: Degenerative changes of the thoracic spine are noted. Review of the MIP images  confirms the above findings. IMPRESSION: No evidence of pulmonary emboli. No acute abnormality noted. Aortic Atherosclerosis (ICD10-I70.0) and Emphysema (ICD10-J43.9). Electronically Signed   By: Oneil Devonshire M.D.   On: 07/22/2023 22:40   DG Chest 2 View Result Date: 07/22/2023 EXAM: 2 VIEW(S) XRAY OF THE CHEST 07/22/2023 06:23:00 PM COMPARISON: Dry 16 2025 CLINICAL HISTORY: SOB. Was seen yesterday for same started she felt SOB this afternoon and it got worse O2 sat down to 82 at home RA 95 now. States her feet are swelling now. FINDINGS: LUNGS AND PLEURA: No focal pulmonary opacity. No pulmonary edema. No pleural effusion. No pneumothorax. Hyperinflation and chronic bronchitic change. HEART AND MEDIASTINUM: No acute abnormality of the cardiac and mediastinal silhouettes. BONES AND SOFT TISSUES: No acute osseous abnormality. IMPRESSION: 1. No acute process. Hyperinflation. Electronically signed by: Norman Gatlin MD 07/22/2023 06:55 PM EDT RP Workstation: [id number]        Scheduled Meds:  budesonide -glycopyrrolate -formoterol   2 puff Inhalation Q12H   enoxaparin  (LOVENOX ) injection  40 mg Subcutaneous Q24H   feeding supplement  237 mL Oral BID BM   levalbuterol   0.63 mg Nebulization Q6H   methylPREDNISolone  (SOLU-MEDROL ) injection  40 mg Intravenous Q12H   Continuous Infusions:   LOS: 0 days   April KANDICE Hoots, MD  07/24/2023, 8:22 AM

## 2023-07-25 DIAGNOSIS — J441 Chronic obstructive pulmonary disease with (acute) exacerbation: Secondary | ICD-10-CM | POA: Diagnosis not present

## 2023-07-25 LAB — CBC
HCT: 34.9 % — ABNORMAL LOW (ref 36.0–46.0)
Hemoglobin: 10.9 g/dL — ABNORMAL LOW (ref 12.0–15.0)
MCH: 30.8 pg (ref 26.0–34.0)
MCHC: 31.2 g/dL (ref 30.0–36.0)
MCV: 98.6 fL (ref 80.0–100.0)
Platelets: 182 K/uL (ref 150–400)
RBC: 3.54 MIL/uL — ABNORMAL LOW (ref 3.87–5.11)
RDW: 13.6 % (ref 11.5–15.5)
WBC: 13 K/uL — ABNORMAL HIGH (ref 4.0–10.5)
nRBC: 0 % (ref 0.0–0.2)

## 2023-07-25 LAB — BASIC METABOLIC PANEL WITH GFR
Anion gap: 10 (ref 5–15)
BUN: 29 mg/dL — ABNORMAL HIGH (ref 8–23)
CO2: 22 mmol/L (ref 22–32)
Calcium: 9.1 mg/dL (ref 8.9–10.3)
Chloride: 103 mmol/L (ref 98–111)
Creatinine, Ser: 0.96 mg/dL (ref 0.44–1.00)
GFR, Estimated: >60 mL/min (ref >60)
Glucose, Bld: 156 mg/dL — ABNORMAL HIGH (ref 70–99)
Potassium: 4.2 mmol/L (ref 3.5–5.1)
Sodium: 135 mmol/L (ref 135–145)

## 2023-07-25 MED ORDER — LEVOTHYROXINE SODIUM 88 MCG PO TABS
88.0000 ug | ORAL_TABLET | Freq: Every day | ORAL | Status: DC
  Administered 2023-07-26 – 2023-07-28 (×3): 88 ug via ORAL
  Filled 2023-07-25 (×3): qty 1

## 2023-07-25 MED ORDER — METOPROLOL SUCCINATE ER 25 MG PO TB24
25.0000 mg | ORAL_TABLET | Freq: Every day | ORAL | Status: DC
Start: 2023-07-25 — End: 2023-07-28
  Administered 2023-07-25 – 2023-07-27 (×3): 25 mg via ORAL
  Filled 2023-07-25 (×3): qty 1

## 2023-07-25 MED ORDER — PREDNISONE 20 MG PO TABS
40.0000 mg | ORAL_TABLET | Freq: Every day | ORAL | Status: DC
  Administered 2023-07-26 – 2023-07-28 (×3): 40 mg via ORAL
  Filled 2023-07-25 (×3): qty 2

## 2023-07-25 MED ORDER — BUPROPION HCL ER (XL) 300 MG PO TB24
300.0000 mg | ORAL_TABLET | Freq: Every day | ORAL | Status: DC
  Administered 2023-07-25 – 2023-07-27 (×3): 300 mg via ORAL
  Filled 2023-07-25 (×3): qty 1

## 2023-07-25 MED ORDER — FUROSEMIDE 20 MG PO TABS
20.0000 mg | ORAL_TABLET | Freq: Every day | ORAL | Status: DC
  Administered 2023-07-26 – 2023-07-28 (×3): 20 mg via ORAL
  Filled 2023-07-25 (×3): qty 1

## 2023-07-25 MED ORDER — MELATONIN 5 MG PO TABS
5.0000 mg | ORAL_TABLET | Freq: Once | ORAL | Status: AC
  Administered 2023-07-25: 5 mg via ORAL
  Filled 2023-07-25: qty 1

## 2023-07-25 MED ORDER — METHYLPREDNISOLONE SODIUM SUCC 40 MG IJ SOLR
40.0000 mg | Freq: Two times a day (BID) | INTRAMUSCULAR | Status: AC
  Administered 2023-07-25 (×2): 40 mg via INTRAVENOUS
  Filled 2023-07-25 (×2): qty 1

## 2023-07-25 MED ORDER — MELATONIN 5 MG PO TABS
5.0000 mg | ORAL_TABLET | Freq: Every day | ORAL | Status: DC
  Administered 2023-07-25 – 2023-07-27 (×3): 5 mg via ORAL
  Filled 2023-07-25 (×3): qty 1

## 2023-07-25 NOTE — Progress Notes (Signed)
 Physical Therapy Treatment Patient Details Name: April [name] MRN: [id number] DOB: 11/19/1955 Today's Date: 07/25/2023   History of Present Illness April [name] is a 68 y.o. F admitted 07/22/23 due to 3 weeks of cough, shortness of breath, dyspnea with exertion found to have acute hypoxic respiratory failure due to acute exacerbation of COPD. PMH includes COPD on room air, Parkinson's disease, CKD, nonischemic cardiomyopathy with preserved EF, bil TSA, Bil THA, L TKA,    PT Comments  Pt continues very cooperative and with marked improvement in activity tolerance vs this am.  Pt assisted up to stand at sink to brush teeth and up to hallway to ambulate increased distance with improvement noted in stability with all tasks.  Pt requesting O2 moved from 2L at rest to 3L for activity.    If plan is discharge home, recommend the following: A little help with walking and/or transfers;A little help with bathing/dressing/bathroom;Assistance with cooking/housework;Assist for transportation;Help with stairs or ramp for entrance   Can travel by private vehicle        Equipment Recommendations  None recommended by PT    Recommendations for Other Services       Precautions / Restrictions Precautions Recall of Precautions/Restrictions: Intact Precaution/Restrictions Comments: watch SpO2 Restrictions Weight Bearing Restrictions Per Provider Order: No     Mobility  Bed Mobility Overal bed mobility: Needs Assistance Bed Mobility: Supine to Sit     Supine to sit: Supervision     General bed mobility comments: up in chair and requests back to same    Transfers Overall transfer level: Needs assistance Equipment used: Rolling walker (2 wheels) Transfers: Sit to/from Stand Sit to Stand: Min assist, Contact guard assist           General transfer comment: Steady assist with min cues for LE management and use of UEs to self assist    Ambulation/Gait Ambulation/Gait assistance: Contact  guard assist Gait Distance (Feet): 140 Feet Assistive device: Rolling walker (2 wheels) Gait Pattern/deviations: Step-to pattern, Step-through pattern, Decreased step length - right, Decreased step length - left, Shuffle, Trunk flexed, Narrow base of support Gait velocity: decreased     General Gait Details: Increased time with standing rest breaks and cues for posture, position from RW and to increase BOS for stability.   Stairs             Wheelchair Mobility     Tilt Bed    Modified Rankin (Stroke Patients Only)       Balance Overall balance assessment: Needs assistance Sitting-balance support: No upper extremity supported, Feet supported Sitting balance-Leahy Scale: Good Sitting balance - Comments: unchallenged   Standing balance support: Single extremity supported Standing balance-Leahy Scale: Poor Standing balance comment: able to stand at sink to brush teeth but using counter to stabilize                            Communication Communication Communication: No apparent difficulties  Cognition Arousal: Alert Behavior During Therapy: WFL for tasks assessed/performed   PT - Cognitive impairments: No apparent impairments                         Following commands: Intact      Cueing Cueing Techniques: Verbal cues  Exercises      General Comments        Pertinent Vitals/Pain Pain Assessment Pain Assessment: No/denies pain    Home Living  Prior Function            PT Goals (current goals can now be found in the care plan section) Acute Rehab PT Goals Patient Stated Goal: Regain IND PT Goal Formulation: With patient Time For Goal Achievement: 08/06/23 Potential to Achieve Goals: Good Progress towards PT goals: Progressing toward goals    Frequency    7X/week      PT Plan      Co-evaluation              AM-PAC PT 6 Clicks Mobility   Outcome Measure  Help needed  turning from your back to your side while in a flat bed without using bedrails?: A Little Help needed moving from lying on your back to sitting on the side of a flat bed without using bedrails?: A Little Help needed moving to and from a bed to a chair (including a wheelchair)?: A Little Help needed standing up from a chair using your arms (e.g., wheelchair or bedside chair)?: A Little Help needed to walk in hospital room?: A Little Help needed climbing 3-5 steps with a railing? : A Lot 6 Click Score: 17    End of Session Equipment Utilized During Treatment: Gait belt Activity Tolerance: Patient tolerated treatment well;Patient limited by fatigue Patient left: in chair;with call bell/phone within reach;with chair alarm set;with family/visitor present Nurse Communication: Mobility status PT Visit Diagnosis: Difficulty in walking, not elsewhere classified (R26.2)     Time: 8555-8491 PT Time Calculation (min) (ACUTE ONLY): 24 min  Charges:    $Gait Training: 8-22 mins $Therapeutic Activity: 8-22 mins PT General Charges $$ ACUTE PT VISIT: 1 Visit                     [facility] PT Acute Rehabilitation Services Office [phone]    Braison Snoke 07/25/2023, 3:17 PM

## 2023-07-25 NOTE — Progress Notes (Signed)
 PROGRESS NOTE    April [name]  FMW:[id number] DOB: [birth year] DOA: 07/22/2023 PCP: Tonnie Jon SAILOR, NP    Brief Narrative:   April [name] is a 68 y.o. female with past medical history significant for COPD not oxygen  dependent, Parkinson's disease, nonischemic cardiomyopathy, CKD stage IIIa who presented to [facility] ED on 07/22/2023 with shortness of breath and noted hypoxia with SpO2 82% on room air at home.  Patient reports 3 weeks of cough, shortness of breath with exertion.  Has been recently prescribed oral steroids by her PCP and received antibiotics including amoxicillin  and doxycycline  without much improvement.  In the ED, temperature 98.5 F, HR 122, RR 18, BP 114/67, SpO2 95% on room air.  WBC 10.2, hemoglobin 11.9, platelet count 169.  Sodium 135, potassium 4.3, chloride 99, CO2 21, glucose 105, BUN 24, creat 1.12.  D-dimer elevated 5.40.  High-sensitivity troponin 36 followed by 34.  BNP 198.0.  Chest x-ray with no acute process, hyperinflation.  CT angiogram chest negative for pulmonary embolism and no acute abnormality within the chest.  Patient was given IV magnesium , Solu-Medrol  and DuoNeb.  TRH was consulted for admission and patient was transferred to [facility] for further evaluation and management of COPD exacerbation.  Assessment & Plan:   Acute COPD exacerbation Acute hypoxic Respiratory Failure, POA Patient presenting with 3-week history of progressive shortness of breath despite oral steroid antibiotic use outpatient.  Patient was afebrile without leukocytosis.  BNP within normal limits.  Chest x-ray with no acute process.  D-dimer elevated 5.40 with CT angiogram chest negative for pulmonary embolism or other acute abnormality within the chest.  TTE with LVEF 50-55%, no LV regional wall motion normalities, grade 1 diastolic dysfunction, no aortic stenosis, IVC normal. -- Breztri  2 puffs every 12 hours -- Xopenex  neb twice daily -- Solu-Medrol   40 mg IV every 12 hours> transition to prednisone  40 mg p.o. daily tomorrow -- Albuterol  neb every 2 hours as needed wheezing/shortness of breath -- Continue supplemental oxygen , maintain SPO2 greater than 88%   CKD stage IIIa Creatinine 0.96, stable.  Hypothyroidism -- Levothyroxine  88 mcg p.o. daily  Depression -- Zoloft  50 mg p.o. nightly  -- Bupropion  3 mg p.o. nightly  HTN -- Metoprolol  succinate 25 mg p.o. at bedtime -- Furosemide  20 mg p.o. daily -- Holding home valsartan  40 mg p.o. daily for now  HLD -- Crestor  10 mg p.o. nightly  Parkinson disease -- Sinemet  IR 25-100 mg p.o. 3 times daily  DVT prophylaxis: enoxaparin  (LOVENOX ) injection 40 mg Start: 07/23/23 1000    Code Status: Full Code Family Communication: No family present at bedside this morning  Disposition Plan:  Level of care: Telemetry Status is: Inpatient Remains inpatient appropriate because: Pending SNF placement    Consultants:  None  Procedures:  TTE  Antimicrobials:  None   Subjective: Patient seen examined bedside, sitting in bedside chair.  No complaints this morning.  Breathing improved.  Remains on 2 L nasal cannula.  Pending SNF placement.  Discussed transition IV Solu-Medrol  to prednisone  tomorrow.  No other questions or concerns at this time.  Denies headache, no dizziness, no chest pain, no palpitations, no fever/chills/night sweats, no nausea/vomit/diarrhea, no focal weakness, no fatigue, no paresthesias.  No acute events overnight per nursing staff.  Objective: Vitals:   07/24/23 2138 07/25/23 0513 07/25/23 0829 07/25/23 1329  BP:  130/81  (!) 140/94  Pulse:  95  (!) 109  Resp:  18  19  Temp:  97.7 F (36.5 C)  97.8 F (36.6 C)  TempSrc:  Oral  Oral  SpO2: 96% 93% 97% 95%  Weight:      Height:        Intake/Output Summary (Last 24 hours) at 07/25/2023 1404 Last data filed at 07/25/2023 0524 Gross per 24 hour  Intake 240 ml  Output 550 ml  Net -310 ml   Filed  Weights   07/23/23 0523 07/23/23 0621  Weight: 76.7 kg 76.4 kg    Examination:  Physical Exam: GEN: NAD, alert and oriented x 3, wd/wn HEENT: NCAT, PERRL, EOMI, sclera clear, MMM PULM: CTAB w/o wheezes/crackles, normal respiratory effort, on 2 L nasal cannula with SpO2 95% at rest CV: RRR w/o M/G/R GI: abd soft, NTND, NABS, no R/G/M+ BS NEURO: no focal deficits, sensation to light touch intact PSYCH: normal mood/affect Integumentary: Areas of ecchymosis in various stages of healing bilateral lower extremities, no other concerning rashes/lesions/wounds noted on exposed skin surfaces     Data Reviewed: I have personally reviewed following labs and imaging studies  CBC: Recent Labs  Lab 07/22/23 1813 07/22/23 2334 07/24/23 0637 07/25/23 0604  WBC 10.2  --  14.1* 13.0*  HGB 11.9* 11.9* 10.8* 10.9*  HCT 36.4 35.0* 33.9* 34.9*  MCV 95.8  --  98.3 98.6  PLT 169  --  156 182   Basic Metabolic Panel: Recent Labs  Lab 07/22/23 1813 07/22/23 2334 07/24/23 0637 07/25/23 0604  NA 135 135 133* 135  K 4.3 4.4 4.3 4.2  CL 99  --  102 103  CO2 21*  --  24 22  GLUCOSE 105*  --  162* 156*  BUN 24*  --  27* 29*  CREATININE 1.12*  --  0.99 0.96  CALCIUM  9.1  --  8.8* 9.1   GFR: Estimated Creatinine Clearance: 57.6 mL/min (by C-G formula based on SCr of 0.96 mg/dL). Liver Function Tests: No results for input(s): AST, ALT, ALKPHOS, BILITOT, PROT, ALBUMIN in the last 168 hours. No results for input(s): LIPASE, AMYLASE in the last 168 hours. No results for input(s): AMMONIA in the last 168 hours. Coagulation Profile: No results for input(s): INR, PROTIME in the last 168 hours. Cardiac Enzymes: No results for input(s): CKTOTAL, CKMB, CKMBINDEX, TROPONINI in the last 168 hours. BNP (last 3 results) Recent Labs    07/22/23 1811  PROBNP 198.0   HbA1C: No results for input(s): HGBA1C in the last 72 hours. CBG: No results for input(s): GLUCAP in  the last 168 hours. Lipid Profile: No results for input(s): CHOL, HDL, LDLCALC, TRIG, CHOLHDL, LDLDIRECT in the last 72 hours. Thyroid  Function Tests: Recent Labs    07/24/23 0635  TSH 2.111   Anemia Panel: No results for input(s): VITAMINB12, FOLATE, FERRITIN, TIBC, IRON, RETICCTPCT in the last 72 hours. Sepsis Labs: No results for input(s): PROCALCITON, LATICACIDVEN in the last 168 hours.  No results found for this or any previous visit (from the past 240 hours).       Radiology Studies: ECHOCARDIOGRAM COMPLETE Result Date: 07/24/2023    ECHOCARDIOGRAM REPORT   Patient Name:   April [name] Date of Exam: 07/24/2023 Medical Rec #:  [id number]        Height:       66.5 in Accession #:    [id number]       Weight:       168.4 lb Date of Birth:  12-22-1955         BSA:  1.869 m Patient Age:    68 years         BP:           117/65 mmHg Patient Gender: F                HR:           107 bpm. Exam Location:  Inpatient Procedure: 2D Echo, Color Doppler and Cardiac Doppler (Both Spectral and Color            Flow Doppler were utilized during procedure). Indications:    Abnormal ECG R94.31  History:        Patient has prior history of Echocardiogram examinations, most                 recent 06/10/2021. CAD, Abnormal ECG, COPD and CKD,                 Signs/Symptoms:Shortness of Breath and Dyspnea; Risk                 Factors:Hypertension, Dyslipidemia and Former Smoker.  Sonographer:    Koleen Popper RDCS Referring Phys: [id number] ALMARIE MATSU MATHEWS  Sonographer Comments: Technically challenging study due to limited acoustic windows. Image acquisition challenging due to COPD and Image acquisition challenging due to respiratory motion. IMPRESSIONS  1. Left ventricular ejection fraction, by estimation, is 50 to 55%. The left ventricle has low normal function. The left ventricle has no regional wall motion abnormalities. Left ventricular diastolic parameters are  consistent with Grade I diastolic dysfunction (impaired relaxation).  2. Right ventricular systolic function is normal. The right ventricular size is normal. There is normal pulmonary artery systolic pressure.  3. The mitral valve is normal in structure. No evidence of mitral valve regurgitation. No evidence of mitral stenosis.  4. The aortic valve is normal in structure. Aortic valve regurgitation is not visualized. No aortic stenosis is present.  5. The inferior vena cava is normal in size with greater than 50% respiratory variability, suggesting right atrial pressure of 3 mmHg. FINDINGS  Left Ventricle: Left ventricular ejection fraction, by estimation, is 50 to 55%. The left ventricle has low normal function. The left ventricle has no regional wall motion abnormalities. The left ventricular internal cavity size was normal in size. There is no left ventricular hypertrophy. Left ventricular diastolic parameters are consistent with Grade I diastolic dysfunction (impaired relaxation). Right Ventricle: The right ventricular size is normal. No increase in right ventricular wall thickness. Right ventricular systolic function is normal. There is normal pulmonary artery systolic pressure. The tricuspid regurgitant velocity is 2.10 m/s, and  with an assumed right atrial pressure of 3 mmHg, the estimated right ventricular systolic pressure is 20.6 mmHg. Left Atrium: Left atrial size was normal in size. Right Atrium: Right atrial size was normal in size. Pericardium: Trivial pericardial effusion is present. The pericardial effusion is circumferential. Presence of epicardial fat layer. Mitral Valve: The mitral valve is normal in structure. No evidence of mitral valve regurgitation. No evidence of mitral valve stenosis. Tricuspid Valve: The tricuspid valve is normal in structure. Tricuspid valve regurgitation is not demonstrated. No evidence of tricuspid stenosis. Aortic Valve: The aortic valve is normal in structure. Aortic  valve regurgitation is not visualized. No aortic stenosis is present. Pulmonic Valve: The pulmonic valve was normal in structure. Pulmonic valve regurgitation is not visualized. No evidence of pulmonic stenosis. Aorta: The aortic root is normal in size and structure. Venous: The inferior vena cava is normal in size  with greater than 50% respiratory variability, suggesting right atrial pressure of 3 mmHg. IAS/Shunts: No atrial level shunt detected by color flow Doppler.  LEFT VENTRICLE PLAX 2D LVIDd:         4.30 cm   Diastology LVIDs:         3.20 cm   LV e' medial:    6.53 cm/s LV PW:         0.90 cm   LV E/e' medial:  10.6 LV IVS:        0.90 cm   LV e' lateral:   10.40 cm/s LVOT diam:     1.80 cm   LV E/e' lateral: 6.6 LV SV:         44 LV SV Index:   24 LVOT Area:     2.54 cm  IVC IVC diam: 1.60 cm LEFT ATRIUM         Index LA diam:    2.80 cm 1.50 cm/m  AORTIC VALVE LVOT Vmax:   108.00 cm/s LVOT Vmean:  75.500 cm/s LVOT VTI:    0.174 m  AORTA Ao Root diam: 3.00 cm MITRAL VALVE                TRICUSPID VALVE MV Area (PHT): 4.96 cm     TR Peak grad:   17.6 mmHg MV Decel Time: 153 msec     TR Vmax:        210.00 cm/s MV E velocity: 68.90 cm/s MV A velocity: 110.00 cm/s  SHUNTS MV E/A ratio:  0.63         Systemic VTI:  0.17 m                             Systemic Diam: 1.80 cm Kardie Tobb DO Electronically signed by Dub Huntsman DO Signature Date/Time: 07/24/2023/1:51:54 PM    Final         Scheduled Meds:  budesonide -glycopyrrolate -formoterol   2 puff Inhalation Q12H   carbidopa -levodopa   1 tablet Oral TID   enoxaparin  (LOVENOX ) injection  40 mg Subcutaneous Q24H   feeding supplement  237 mL Oral BID BM   levalbuterol   0.63 mg Nebulization BID   melatonin  5 mg Oral QHS   methylPREDNISolone  (SOLU-MEDROL ) injection  40 mg Intravenous Q12H   [START ON 07/26/2023] predniSONE   40 mg Oral Q breakfast   rosuvastatin   10 mg Oral QHS   sertraline   50 mg Oral QHS   Continuous Infusions:   LOS: 1 day     Time spent: 48 minutes spent on 07/25/2023 caring for this patient face-to-face including chart review, ordering labs/tests, documenting, discussion with nursing staff, consultants, updating family and interview/physical exam    Camellia PARAS [place], DO [facility] Available via Epic secure chat 7am-7pm After these hours, please refer to coverage provider listed on [email or web address] 07/25/2023, 2:04 PM

## 2023-07-25 NOTE — Plan of Care (Signed)

## 2023-07-25 NOTE — TOC Progression Note (Signed)
 Transition of Care [facility]) - Progression Note    Patient Details  Name: April [name] MRN: [id number] Date of Birth: Dec 30, 1955  [facility] CM/SW Contact  Lorraine LILLETTE Fenton, LCSW Phone Number: 07/25/2023, 3:27 PM  Clinical Narrative:    CSW met with pt in room, she was sitting in chair, family present. Discussed recommendation of SNF. Pt feels this will help her and she wants to pursue.  Her first choice is [facility], then [facility], [place].  CSW will initiate bed requests today.  TOC following.      Barriers to Discharge: Continued Medical Work up  Expected Discharge Plan and Services                                               Social Determinants of Health (SDOH) Interventions SDOH Screenings   Food Insecurity: No Food Insecurity (07/23/2023)  Housing: High Risk (07/23/2023)  Transportation Needs: No Transportation Needs (07/23/2023)  Utilities: Not At Risk (07/23/2023)  Alcohol  Screen: Low Risk  (11/26/2021)  Depression (PHQ2-9): Low Risk  (04/08/2023)  Financial Resource Strain: Medium Risk (12/01/2022)  Physical Activity: Insufficiently Active (12/01/2022)  Social Connections: Socially Isolated (07/23/2023)  Stress: No Stress Concern Present (12/01/2022)  Tobacco Use: Medium Risk (07/22/2023)  Health Literacy: Adequate Health Literacy (12/01/2022)    Readmission Risk Interventions     No data to display

## 2023-07-25 NOTE — Progress Notes (Signed)
 Physical Therapy Treatment Patient Details Name: April [name] MRN: [id number] DOB: 20-Oct-1955 Today's Date: 07/25/2023   History of Present Illness April [name] is a 68 y.o. F admitted 07/22/23 due to 3 weeks of cough, shortness of breath, dyspnea with exertion found to have acute hypoxic respiratory failure due to acute exacerbation of COPD. PMH includes COPD on room air, Parkinson's disease, CKD, nonischemic cardiomyopathy with preserved EF, bil TSA, Bil THA, L TKA,    PT Comments  Pt continues very pleasant and motivated but fatigues easily and with noted balance deficits with ambulation.    If plan is discharge home, recommend the following: A little help with walking and/or transfers;A little help with bathing/dressing/bathroom;Assistance with cooking/housework;Assist for transportation;Help with stairs or ramp for entrance   Can travel by private vehicle        Equipment Recommendations  None recommended by PT    Recommendations for Other Services       Precautions / Restrictions Precautions Recall of Precautions/Restrictions: Intact Precaution/Restrictions Comments: watch SpO2 Restrictions Weight Bearing Restrictions Per Provider Order: No     Mobility  Bed Mobility Overal bed mobility: Needs Assistance Bed Mobility: Supine to Sit     Supine to sit: Supervision     General bed mobility comments: Increased time, use of bed rails    Transfers Overall transfer level: Needs assistance Equipment used: Rolling walker (2 wheels) Transfers: Sit to/from Stand Sit to Stand: Min assist           General transfer comment: Steady assist with cues for LE management and use of UEs to self assist    Ambulation/Gait Ambulation/Gait assistance: Min assist Gait Distance (Feet): 58 Feet Assistive device: Rolling walker (2 wheels) Gait Pattern/deviations: Step-to pattern, Step-through pattern, Decreased step length - right, Decreased step length - left, Shuffle, Trunk  flexed, Narrow base of support Gait velocity: decreased     General Gait Details: Increased time with standing rest breaks and cues for posture, position from RW and to increase BOS for stability.  Pt with one episode balance loss requiring min assist to stabilize.  Distance ltd by fatigue   Stairs             Wheelchair Mobility     Tilt Bed    Modified Rankin (Stroke Patients Only)       Balance Overall balance assessment: Needs assistance Sitting-balance support: No upper extremity supported, Feet supported Sitting balance-Leahy Scale: Good Sitting balance - Comments: unchallenged   Standing balance support: Bilateral upper extremity supported Standing balance-Leahy Scale: Poor                              Communication Communication Communication: No apparent difficulties  Cognition Arousal: Alert Behavior During Therapy: WFL for tasks assessed/performed   PT - Cognitive impairments: No apparent impairments                         Following commands: Intact      Cueing Cueing Techniques: Verbal cues  Exercises      General Comments        Pertinent Vitals/Pain Pain Assessment Pain Assessment: No/denies pain    Home Living                          Prior Function            PT Goals (current goals  can now be found in the care plan section) Acute Rehab PT Goals Patient Stated Goal: Regain IND PT Goal Formulation: With patient Time For Goal Achievement: 08/06/23 Potential to Achieve Goals: Good Progress towards PT goals: Progressing toward goals    Frequency    7X/week      PT Plan      Co-evaluation              AM-PAC PT 6 Clicks Mobility   Outcome Measure  Help needed turning from your back to your side while in a flat bed without using bedrails?: A Little Help needed moving from lying on your back to sitting on the side of a flat bed without using bedrails?: A Little Help needed  moving to and from a bed to a chair (including a wheelchair)?: A Little Help needed standing up from a chair using your arms (e.g., wheelchair or bedside chair)?: A Little Help needed to walk in hospital room?: A Little Help needed climbing 3-5 steps with a railing? : A Lot 6 Click Score: 17    End of Session Equipment Utilized During Treatment: Gait belt Activity Tolerance: Patient tolerated treatment well;Patient limited by fatigue Patient left: in chair;with call bell/phone within reach;with chair alarm set Nurse Communication: Mobility status PT Visit Diagnosis: Difficulty in walking, not elsewhere classified (R26.2)     Time: 9158-9091 PT Time Calculation (min) (ACUTE ONLY): 27 min  Charges:    $Gait Training: 8-22 mins PT General Charges $$ ACUTE PT VISIT: 1 Visit                     [facility] PT Acute Rehabilitation Services Office [phone]    Kerrigan Gombos 07/25/2023, 12:18 PM

## 2023-07-26 DIAGNOSIS — J441 Chronic obstructive pulmonary disease with (acute) exacerbation: Secondary | ICD-10-CM | POA: Diagnosis not present

## 2023-07-26 LAB — BASIC METABOLIC PANEL WITH GFR
Anion gap: 9 (ref 5–15)
BUN: 40 mg/dL — ABNORMAL HIGH (ref 8–23)
CO2: 22 mmol/L (ref 22–32)
Calcium: 8.9 mg/dL (ref 8.9–10.3)
Chloride: 101 mmol/L (ref 98–111)
Creatinine, Ser: 0.95 mg/dL (ref 0.44–1.00)
GFR, Estimated: >60 mL/min (ref >60)
Glucose, Bld: 154 mg/dL — ABNORMAL HIGH (ref 70–99)
Potassium: 4.5 mmol/L (ref 3.5–5.1)
Sodium: 132 mmol/L — ABNORMAL LOW (ref 135–145)

## 2023-07-26 LAB — CBC
HCT: 33.6 % — ABNORMAL LOW (ref 36.0–46.0)
Hemoglobin: 10.8 g/dL — ABNORMAL LOW (ref 12.0–15.0)
MCH: 31.3 pg (ref 26.0–34.0)
MCHC: 32.1 g/dL (ref 30.0–36.0)
MCV: 97.4 fL (ref 80.0–100.0)
Platelets: 180 K/uL (ref 150–400)
RBC: 3.45 MIL/uL — ABNORMAL LOW (ref 3.87–5.11)
RDW: 13.6 % (ref 11.5–15.5)
WBC: 11.1 K/uL — ABNORMAL HIGH (ref 4.0–10.5)
nRBC: 0 % (ref 0.0–0.2)

## 2023-07-26 NOTE — Progress Notes (Signed)
 PROGRESS NOTE    DYAN LABARBERA  FMW:[id number] DOB: [birth year] DOA: 07/22/2023 PCP: Tonnie Jon SAILOR, NP    Brief Narrative:   April [name] is a 68 y.o. female with past medical history significant for COPD not oxygen  dependent, Parkinson's disease, nonischemic cardiomyopathy, CKD stage IIIa who presented to [facility] ED on 07/22/2023 with shortness of breath and noted hypoxia with SpO2 82% on room air at home.  Patient reports 3 weeks of cough, shortness of breath with exertion.  Has been recently prescribed oral steroids by her PCP and received antibiotics including amoxicillin  and doxycycline  without much improvement.  In the ED, temperature 98.5 F, HR 122, RR 18, BP 114/67, SpO2 95% on room air.  WBC 10.2, hemoglobin 11.9, platelet count 169.  Sodium 135, potassium 4.3, chloride 99, CO2 21, glucose 105, BUN 24, creat 1.12.  D-dimer elevated 5.40.  High-sensitivity troponin 36 followed by 34.  BNP 198.0.  Chest x-ray with no acute process, hyperinflation.  CT angiogram chest negative for pulmonary embolism and no acute abnormality within the chest.  Patient was given IV magnesium , Solu-Medrol  and DuoNeb.  TRH was consulted for admission and patient was transferred to [facility] for further evaluation and management of COPD exacerbation.  Assessment & Plan:   Acute COPD exacerbation Acute hypoxic Respiratory Failure, POA Patient presenting with 3-week history of progressive shortness of breath despite oral steroid antibiotic use outpatient.  Patient was afebrile without leukocytosis.  BNP within normal limits.  Chest x-ray with no acute process.  D-dimer elevated 5.40 with CT angiogram chest negative for pulmonary embolism or other acute abnormality within the chest.  TTE with LVEF 50-55%, no LV regional wall motion normalities, grade 1 diastolic dysfunction, no aortic stenosis, IVC normal. -- Breztri  2 puffs every 12 hours -- Xopenex  neb twice daily -- Solu-Medrol   transitioned to prednisone  40 mg p.o. daily today -- Albuterol  neb every 2 hours as needed wheezing/shortness of breath -- Continue supplemental oxygen , maintain SPO2 greater than 88%; oxygen  now weaned off  CKD stage IIIa Creatinine 0.96, stable.  Hypothyroidism -- Levothyroxine  88 mcg p.o. daily  Depression -- Zoloft  50 mg p.o. nightly  -- Bupropion  3 mg p.o. nightly  HTN -- Metoprolol  succinate 25 mg p.o. at bedtime -- Furosemide  20 mg p.o. daily -- Holding home valsartan  40 mg p.o. daily for now  HLD -- Crestor  10 mg p.o. nightly  Parkinson disease -- Sinemet  IR 25-100 mg p.o. 3 times daily  DVT prophylaxis: enoxaparin  (LOVENOX ) injection 40 mg Start: 07/23/23 1000    Code Status: Full Code Family Communication: No family present at bedside this morning  Disposition Plan:  Level of care: Telemetry Status is: Inpatient Remains inpatient appropriate because: Pending SNF placement    Consultants:  None  Procedures:  TTE  Antimicrobials:  None   Subjective: Patient seen examined bedside, lying in bed.  Awaiting for breakfast.  Reports nonproductive cough.  Titrated off of supplemental oxygen .  Transition to oral prednisone  today.  Awaiting SNF placement.  No other questions, concerns or complaints at this time. Denies headache, no dizziness, no chest pain, no palpitations, no fever/chills/night sweats, no nausea/vomit/diarrhea, no focal weakness, no fatigue, no paresthesias.  No acute events overnight per nursing staff.  Objective: Vitals:   07/25/23 2100 07/26/23 0354 07/26/23 0828 07/26/23 0927  BP:  138/87  127/71  Pulse:  95  97  Resp:  19    Temp:  97.9 F (36.6 C)    TempSrc:  Oral    SpO2: 93% 92% 94%   Weight:      Height:        Intake/Output Summary (Last 24 hours) at 07/26/2023 1210 Last data filed at 07/26/2023 0900 Gross per 24 hour  Intake 480 ml  Output --  Net 480 ml   Filed Weights   07/23/23 0523 07/23/23 0621  Weight: 76.7 kg  76.4 kg    Examination:  Physical Exam: GEN: NAD, alert and oriented x 3, wd/wn HEENT: NCAT, PERRL, EOMI, sclera clear, MMM PULM: CTAB w/o wheezes/crackles, normal respiratory effort, on room air with SpO2 94% at rest  CV: RRR w/o M/G/R GI: abd soft, NTND, + BS NEURO: no focal deficits, sensation to light touch intact PSYCH: normal mood/affect Integumentary: Areas of ecchymosis in various stages of healing bilateral lower extremities, no other concerning rashes/lesions/wounds noted on exposed skin surfaces     Data Reviewed: I have personally reviewed following labs and imaging studies  CBC: Recent Labs  Lab 07/22/23 1813 07/22/23 2334 07/24/23 0637 07/25/23 0604 07/26/23 0542  WBC 10.2  --  14.1* 13.0* 11.1*  HGB 11.9* 11.9* 10.8* 10.9* 10.8*  HCT 36.4 35.0* 33.9* 34.9* 33.6*  MCV 95.8  --  98.3 98.6 97.4  PLT 169  --  156 182 180   Basic Metabolic Panel: Recent Labs  Lab 07/22/23 1813 07/22/23 2334 07/24/23 0637 07/25/23 0604 07/26/23 0542  NA 135 135 133* 135 132*  K 4.3 4.4 4.3 4.2 4.5  CL 99  --  102 103 101  CO2 21*  --  24 22 22   GLUCOSE 105*  --  162* 156* 154*  BUN 24*  --  27* 29* 40*  CREATININE 1.12*  --  0.99 0.96 0.95  CALCIUM  9.1  --  8.8* 9.1 8.9   GFR: Estimated Creatinine Clearance: 58.2 mL/min (by C-G formula based on SCr of 0.95 mg/dL). Liver Function Tests: No results for input(s): AST, ALT, ALKPHOS, BILITOT, PROT, ALBUMIN in the last 168 hours. No results for input(s): LIPASE, AMYLASE in the last 168 hours. No results for input(s): AMMONIA in the last 168 hours. Coagulation Profile: No results for input(s): INR, PROTIME in the last 168 hours. Cardiac Enzymes: No results for input(s): CKTOTAL, CKMB, CKMBINDEX, TROPONINI in the last 168 hours. BNP (last 3 results) Recent Labs    07/22/23 1811  PROBNP 198.0   HbA1C: No results for input(s): HGBA1C in the last 72 hours. CBG: No results for input(s):  GLUCAP in the last 168 hours. Lipid Profile: No results for input(s): CHOL, HDL, LDLCALC, TRIG, CHOLHDL, LDLDIRECT in the last 72 hours. Thyroid  Function Tests: Recent Labs    07/24/23 0635  TSH 2.111   Anemia Panel: No results for input(s): VITAMINB12, FOLATE, FERRITIN, TIBC, IRON, RETICCTPCT in the last 72 hours. Sepsis Labs: No results for input(s): PROCALCITON, LATICACIDVEN in the last 168 hours.  No results found for this or any previous visit (from the past 240 hours).       Radiology Studies: ECHOCARDIOGRAM COMPLETE Result Date: 07/24/2023    ECHOCARDIOGRAM REPORT   Patient Name:   RENESHA LIZAMA Date of Exam: 07/24/2023 Medical Rec #:  [id number]        Height:       66.5 in Accession #:    [id number]       Weight:       168.4 lb Date of Birth:  01-10-1955         BSA:  1.869 m Patient Age:    68 years         BP:           117/65 mmHg Patient Gender: F                HR:           107 bpm. Exam Location:  Inpatient Procedure: 2D Echo, Color Doppler and Cardiac Doppler (Both Spectral and Color            Flow Doppler were utilized during procedure). Indications:    Abnormal ECG R94.31  History:        Patient has prior history of Echocardiogram examinations, most                 recent 06/10/2021. CAD, Abnormal ECG, COPD and CKD,                 Signs/Symptoms:Shortness of Breath and Dyspnea; Risk                 Factors:Hypertension, Dyslipidemia and Former Smoker.  Sonographer:    Koleen Popper RDCS Referring Phys: [id number] ALMARIE MATSU MATHEWS  Sonographer Comments: Technically challenging study due to limited acoustic windows. Image acquisition challenging due to COPD and Image acquisition challenging due to respiratory motion. IMPRESSIONS  1. Left ventricular ejection fraction, by estimation, is 50 to 55%. The left ventricle has low normal function. The left ventricle has no regional wall motion abnormalities. Left ventricular diastolic parameters  are consistent with Grade I diastolic dysfunction (impaired relaxation).  2. Right ventricular systolic function is normal. The right ventricular size is normal. There is normal pulmonary artery systolic pressure.  3. The mitral valve is normal in structure. No evidence of mitral valve regurgitation. No evidence of mitral stenosis.  4. The aortic valve is normal in structure. Aortic valve regurgitation is not visualized. No aortic stenosis is present.  5. The inferior vena cava is normal in size with greater than 50% respiratory variability, suggesting right atrial pressure of 3 mmHg. FINDINGS  Left Ventricle: Left ventricular ejection fraction, by estimation, is 50 to 55%. The left ventricle has low normal function. The left ventricle has no regional wall motion abnormalities. The left ventricular internal cavity size was normal in size. There is no left ventricular hypertrophy. Left ventricular diastolic parameters are consistent with Grade I diastolic dysfunction (impaired relaxation). Right Ventricle: The right ventricular size is normal. No increase in right ventricular wall thickness. Right ventricular systolic function is normal. There is normal pulmonary artery systolic pressure. The tricuspid regurgitant velocity is 2.10 m/s, and  with an assumed right atrial pressure of 3 mmHg, the estimated right ventricular systolic pressure is 20.6 mmHg. Left Atrium: Left atrial size was normal in size. Right Atrium: Right atrial size was normal in size. Pericardium: Trivial pericardial effusion is present. The pericardial effusion is circumferential. Presence of epicardial fat layer. Mitral Valve: The mitral valve is normal in structure. No evidence of mitral valve regurgitation. No evidence of mitral valve stenosis. Tricuspid Valve: The tricuspid valve is normal in structure. Tricuspid valve regurgitation is not demonstrated. No evidence of tricuspid stenosis. Aortic Valve: The aortic valve is normal in structure.  Aortic valve regurgitation is not visualized. No aortic stenosis is present. Pulmonic Valve: The pulmonic valve was normal in structure. Pulmonic valve regurgitation is not visualized. No evidence of pulmonic stenosis. Aorta: The aortic root is normal in size and structure. Venous: The inferior vena cava is normal in size  with greater than 50% respiratory variability, suggesting right atrial pressure of 3 mmHg. IAS/Shunts: No atrial level shunt detected by color flow Doppler.  LEFT VENTRICLE PLAX 2D LVIDd:         4.30 cm   Diastology LVIDs:         3.20 cm   LV e' medial:    6.53 cm/s LV PW:         0.90 cm   LV E/e' medial:  10.6 LV IVS:        0.90 cm   LV e' lateral:   10.40 cm/s LVOT diam:     1.80 cm   LV E/e' lateral: 6.6 LV SV:         44 LV SV Index:   24 LVOT Area:     2.54 cm  IVC IVC diam: 1.60 cm LEFT ATRIUM         Index LA diam:    2.80 cm 1.50 cm/m  AORTIC VALVE LVOT Vmax:   108.00 cm/s LVOT Vmean:  75.500 cm/s LVOT VTI:    0.174 m  AORTA Ao Root diam: 3.00 cm MITRAL VALVE                TRICUSPID VALVE MV Area (PHT): 4.96 cm     TR Peak grad:   17.6 mmHg MV Decel Time: 153 msec     TR Vmax:        210.00 cm/s MV E velocity: 68.90 cm/s MV A velocity: 110.00 cm/s  SHUNTS MV E/A ratio:  0.63         Systemic VTI:  0.17 m                             Systemic Diam: 1.80 cm Kardie Tobb DO Electronically signed by Dub Huntsman DO Signature Date/Time: 07/24/2023/1:51:54 PM    Final         Scheduled Meds:  budesonide -glycopyrrolate -formoterol   2 puff Inhalation Q12H   buPROPion   300 mg Oral QHS   carbidopa -levodopa   1 tablet Oral TID   enoxaparin  (LOVENOX ) injection  40 mg Subcutaneous Q24H   feeding supplement  237 mL Oral BID BM   furosemide   20 mg Oral Daily   levalbuterol   0.63 mg Nebulization BID   levothyroxine   88 mcg Oral Q0600   melatonin  5 mg Oral QHS   metoprolol  succinate  25 mg Oral QHS   predniSONE   40 mg Oral Q breakfast   rosuvastatin   10 mg Oral QHS   sertraline   50 mg  Oral QHS   Continuous Infusions:   LOS: 2 days    Time spent: 48 minutes spent on 07/26/2023 caring for this patient face-to-face including chart review, ordering labs/tests, documenting, discussion with nursing staff, consultants, updating family and interview/physical exam    Camellia PARAS [place], DO [facility] Available via Epic secure chat 7am-7pm After these hours, please refer to coverage provider listed on [email or web address] 07/26/2023, 12:10 PM

## 2023-07-26 NOTE — Progress Notes (Signed)
 Occupational Therapy Treatment Patient Details Name: April [name] MRN: [id number] DOB: [birth year] Today's Date: 07/26/2023   History of present illness April [name] is a 68 y.o. F admitted 07/22/23 due to 3 weeks of cough, shortness of breath, dyspnea with exertion found to have acute hypoxic respiratory failure due to acute exacerbation of COPD. PMH includes COPD on room air, Parkinson's disease, CKD, nonischemic cardiomyopathy with preserved EF, bil TSA, Bil THA, L TKA,   OT comments  Pt making steady progress. Able to complete ADL at sink level followed by hallway ambulation @ 250 ft on 2L with SpO2 above 94 @ RW level. Pt required 1 seated rest break after ADL with RPE 5/10 after activity. Continue to recommend inpatient follow up therapy, <3 hours/day to maximize functional level of independence with goal to return to ILF. Pt very appreciative. Acute OT to follow.       If plan is discharge home, recommend the following:  A little help with walking and/or transfers;A lot of help with bathing/dressing/bathroom;Assistance with cooking/housework;Assist for transportation;Help with stairs or ramp for entrance   Equipment Recommendations  Other (comment)    Recommendations for Other Services      Precautions / Restrictions Precautions Precautions: Fall Recall of Precautions/Restrictions: Intact Precaution/Restrictions Comments: watch SpO2 Restrictions Weight Bearing Restrictions Per Provider Order: No       Mobility Bed Mobility               General bed mobility comments: sitting EOB    Transfers Overall transfer level: Needs assistance Equipment used: Rolling walker (2 wheels) Transfers: Sit to/from Stand Sit to Stand: Min assist, Contact guard assist                 Balance Overall balance assessment: Needs assistance Sitting-balance support: No upper extremity supported, Feet supported Sitting balance-Leahy Scale: Good Sitting balance - Comments:  unchallenged   Standing balance support: Single extremity supported Standing balance-Leahy Scale: Poor Standing balance comment: able to stand at sink to brush teeth but using counter to stabilize                           ADL either performed or assessed with clinical judgement   ADL Overall ADL's : Needs assistance/impaired Eating/Feeding: Independent   Grooming: Wash/dry face;Set up;Sitting Grooming Details (indicate cue type and reason): leans against sink for balance Upper Body Bathing: Sitting;Set up;Supervision/ safety   Lower Body Bathing: Moderate assistance;Sitting/lateral leans   Upper Body Dressing : Set up;Supervision/safety;Sitting   Lower Body Dressing: Moderate assistance   Toilet Transfer: Minimal assistance;Ambulation;Rolling walker (2 wheels) Toilet Transfer Details (indicate cue type and reason): 1 LOB with ambulation Toileting- Clothing Manipulation and Hygiene: Contact guard assist;Sitting/lateral lean       Functional mobility during ADLs: Minimal assistance;Rolling walker (2 wheels) General ADL Comments: 1 LOB backwars but able to catch balance with step back; RPE 5 during ADL tasks; began education on energy conservation; focusing on proper pursed lip breathing technique; unable to complete figure four positioning; would benefit from AE    Extremity/Trunk Assessment Upper Extremity Assessment Upper Extremity Assessment: Generalized weakness   Lower Extremity Assessment Lower Extremity Assessment: Defer to PT evaluation        Vision   Vision Assessment?: No apparent visual deficits   Perception     Praxis     Communication Communication Communication: No apparent difficulties   Cognition Arousal: Alert Behavior During Therapy: WFL for tasks assessed/performed Cognition:  No apparent impairments                               Following commands: Intact        Cueing   Cueing Techniques: Verbal cues  Exercises       Shoulder Instructions       General Comments      Pertinent Vitals/ Pain       Pain Assessment Pain Assessment: Faces Faces Pain Scale: Hurts a little bit Pain Location: bottom Pain Descriptors / Indicators: Discomfort, Sore Pain Intervention(s): Limited activity within patient's tolerance  Home Living                                          Prior Functioning/Environment              Frequency  Min 2X/week        Progress Toward Goals  OT Goals(current goals can now be found in the care plan section)  Progress towards OT goals: Progressing toward goals  Acute Rehab OT Goals Patient Stated Goal: get stronger and return to ILF OT Goal Formulation: With patient Time For Goal Achievement: 08/07/23 Potential to Achieve Goals: Good ADL Goals Pt Will Perform Grooming: with modified independence;standing Pt Will Perform Upper Body Dressing: with modified independence;sitting Pt Will Perform Lower Body Dressing: with modified independence;sit to/from stand;with adaptive equipment Pt Will Transfer to Toilet: with modified independence;ambulating Pt Will Perform Toileting - Clothing Manipulation and hygiene: with modified independence;sitting/lateral leans Additional ADL Goal #1: Pt will verbalize at least 3 energy conservation strategies to implement during ADL with no cues  Plan      Co-evaluation                 AM-PAC OT 6 Clicks Daily Activity     Outcome Measure   Help from another person eating meals?: None Help from another person taking care of personal grooming?: A Little Help from another person toileting, which includes using toliet, bedpan, or urinal?: A Little Help from another person bathing (including washing, rinsing, drying)?: A Lot Help from another person to put on and taking off regular upper body clothing?: A Little Help from another person to put on and taking off regular lower body clothing?: A Lot 6 Click  Score: 17    End of Session Equipment Utilized During Treatment: Gait belt;Rolling walker (2 wheels);Oxygen  (2)  OT Visit Diagnosis: Unsteadiness on feet (R26.81);Other abnormalities of gait and mobility (R26.89);History of falling (Z91.81);Muscle weakness (generalized) (M62.81);Pain;Other symptoms and signs involving the nervous system (R29.898) Pain - Right/Left:  (central) Pain - part of body:  (buttocks)   Activity Tolerance Patient tolerated treatment well   Patient Left in chair;with call bell/phone within reach;with chair alarm set   Nurse Communication Mobility status;Precautions        Time: 8895-8865 OT Time Calculation (min): 30 min  Charges: OT General Charges $OT Visit: 1 Visit OT Treatments $Self Care/Home Management : 23-37 mins  Kreg Sink, OT/L   Acute OT Clinical Specialist Acute Rehabilitation Services Pager [phone] Office [phone]   [facility] 07/26/2023, 11:44 AM

## 2023-07-26 NOTE — Progress Notes (Signed)
 Mobility Specialist - Progress Note   07/26/23 1518  Mobility  Activity Ambulated with assistance in hallway  Level of Assistance Modified independent, requires aide device or extra time  Assistive Device Front wheel walker  Distance Ambulated (ft) 500 ft  Activity Response Tolerated well  Mobility Referral Yes  Mobility visit 1 Mobility  Mobility Specialist Start Time (ACUTE ONLY) 1459  Mobility Specialist Stop Time (ACUTE ONLY) 1517  Mobility Specialist Time Calculation (min) (ACUTE ONLY) 18 min   Pt received in recliner and agreeable to mobility. Dyspnea with exertion. No complaints during session.  Pt to recliner after session with all needs met.    Post-mobility: 96% SPO2  Chief Technology Officer

## 2023-07-26 NOTE — Plan of Care (Signed)

## 2023-07-27 DIAGNOSIS — J441 Chronic obstructive pulmonary disease with (acute) exacerbation: Secondary | ICD-10-CM | POA: Diagnosis not present

## 2023-07-27 NOTE — Plan of Care (Signed)

## 2023-07-27 NOTE — Progress Notes (Signed)
 Physical Therapy Treatment Patient Details Name: April [name] MRN: [id number] DOB: 24-May-1955 Today's Date: 07/27/2023   History of Present Illness April [name] is a 68 y.o. F admitted 07/22/23 due to 3 weeks of cough, shortness of breath, dyspnea with exertion found to have acute hypoxic respiratory failure due to acute exacerbation of COPD. PMH includes COPD on room air, Parkinson's disease, CKD, nonischemic cardiomyopathy with preserved EF, bil TSA, Bil THA, L TKA,    PT Comments  Pt making gradual progress but continues to fatigue easily and is high fall risk.  Pt with several recent falls, 5 x STS indicative of fall risk, and decreased safety with transfers (needs cues and assist for controlled descent).  She did ambulate 150' but needed 3 standing rest breaks.  Pt did ambulate more yesterday with mobility but reports completely wiped out and not able to do much the rest of the day.   With pt living alone, PCA only 3 hr 4 x week, new O2 need, and hx of falling continue to recommend Patient will benefit from continued inpatient follow up therapy, <3 hours/day at d/c.    If plan is discharge home, recommend the following: A little help with walking and/or transfers;A little help with bathing/dressing/bathroom;Assistance with cooking/housework;Assist for transportation;Help with stairs or ramp for entrance   Can travel by private vehicle     Yes  Equipment Recommendations  None recommended by PT    Recommendations for Other Services       Precautions / Restrictions Precautions Precautions: Fall Precaution/Restrictions Comments: watch SpO2     Mobility  Bed Mobility   Bed Mobility: Sit to Supine       Sit to supine: Contact guard assist   General bed mobility comments: in chair at arrival    Transfers Overall transfer level: Needs assistance Equipment used: Rolling walker (2 wheels) Transfers: Sit to/from Stand Sit to Stand: Min assist, Contact guard assist            General transfer comment: STS x 10 throughout session - mostly CGA ; however, occasional min A and cues for controlled descent    Ambulation/Gait Ambulation/Gait assistance: Contact guard assist Gait Distance (Feet): 50 Feet (50'x3) Assistive device: Rolling walker (2 wheels) Gait Pattern/deviations: Step-to pattern, Decreased stride length Gait velocity: decreased     General Gait Details: 150' total but with 3 standing rest breaks, CGA for safety, cues for RW proximity   Stairs             Wheelchair Mobility     Tilt Bed    Modified Rankin (Stroke Patients Only)       Balance Overall balance assessment: Needs assistance, History of Falls Sitting-balance support: No upper extremity supported, Feet supported Sitting balance-Leahy Scale: Good     Standing balance support: No upper extremity supported, Bilateral upper extremity supported   Standing balance comment: RW to ambulate, could stand wihtout support but close supervision               High Level Balance Comments: Performed STS x 5: 40 sec indicative of fall risk ;   Standing reaching outside BOS with single UE support - each side x 6            Communication    Cognition Arousal: Alert Behavior During Therapy: WFL for tasks assessed/performed   PT - Cognitive impairments: No apparent impairments  PT - Cognition Comments: pleasant and motivated        Cueing    Exercises      General Comments General comments (skin integrity, edema, etc.): Pt on 2 L O2 with sats 98% rest and 94% ambulation with rest breaks      Pertinent Vitals/Pain Pain Assessment Pain Assessment: No/denies pain    Home Living                          Prior Function            PT Goals (current goals can now be found in the care plan section) Progress towards PT goals: Progressing toward goals    Frequency    7X/week      PT Plan      Co-evaluation               AM-PAC PT 6 Clicks Mobility   Outcome Measure  Help needed turning from your back to your side while in a flat bed without using bedrails?: A Little Help needed moving from lying on your back to sitting on the side of a flat bed without using bedrails?: A Little Help needed moving to and from a bed to a chair (including a wheelchair)?: A Little Help needed standing up from a chair using your arms (e.g., wheelchair or bedside chair)?: A Little Help needed to walk in hospital room?: A Little Help needed climbing 3-5 steps with a railing? : A Lot 6 Click Score: 17    End of Session Equipment Utilized During Treatment: Gait belt Activity Tolerance: Patient tolerated treatment well Patient left: with call bell/phone within reach;with family/visitor present;in bed;with bed alarm set Nurse Communication: Mobility status PT Visit Diagnosis: Difficulty in walking, not elsewhere classified (R26.2);History of falling (Z91.81)     Time: 1734-1800 PT Time Calculation (min) (ACUTE ONLY): 26 min  Charges:    $Gait Training: 8-22 mins $Neuromuscular Re-education: 8-22 mins PT General Charges $$ ACUTE PT VISIT: 1 Visit                     April, PT Acute Rehab [facility] Rehab [phone]    April [name] 07/27/2023, 6:05 PM

## 2023-07-27 NOTE — TOC Progression Note (Signed)
 Transition of Care [facility]) - Progression Note    Patient Details  Name: April [name] MRN: [id number] Date of Birth: October 03, 1955  Transition of Care [facility] CM/SW Contact  Toy LITTIE Agar, RN Phone Number:[phone]  07/27/2023, 12:33 PM  Clinical Narrative:    CM at bedside to discuss bed offers with patient. Patient prefers [facility]. CM attempted to contact [facility] admissions liaison and there is no answer. Voicemail has been left. Insurance shara has been initiated. Auth ID [id number].     Barriers to Discharge: Continued Medical Work up  Expected Discharge Plan and Services                                               Social Determinants of Health (SDOH) Interventions SDOH Screenings   Food Insecurity: No Food Insecurity (07/23/2023)  Housing: High Risk (07/23/2023)  Transportation Needs: No Transportation Needs (07/23/2023)  Utilities: Not At Risk (07/23/2023)  Alcohol  Screen: Low Risk  (11/26/2021)  Depression (PHQ2-9): Low Risk  (04/08/2023)  Financial Resource Strain: Medium Risk (12/01/2022)  Physical Activity: Insufficiently Active (12/01/2022)  Social Connections: Socially Isolated (07/23/2023)  Stress: No Stress Concern Present (12/01/2022)  Tobacco Use: Medium Risk (07/22/2023)  Health Literacy: Adequate Health Literacy (12/01/2022)    Readmission Risk Interventions     No data to display

## 2023-07-27 NOTE — Progress Notes (Signed)
 PROGRESS NOTE    April [name]  FMW:[id number] DOB: 1955/04/13 DOA: 07/22/2023 PCP: Tonnie Jon SAILOR, NP    Brief Narrative:   April [name] is a 68 y.o. female with past medical history significant for COPD not oxygen  dependent, Parkinson's disease, nonischemic cardiomyopathy, CKD stage IIIa who presented to [facility] ED on 07/22/2023 with shortness of breath and noted hypoxia with SpO2 82% on room air at home.  Patient reports 3 weeks of cough, shortness of breath with exertion.  Has been recently prescribed oral steroids by her PCP and received antibiotics including amoxicillin  and doxycycline  without much improvement.  In the ED, temperature 98.5 F, HR 122, RR 18, BP 114/67, SpO2 95% on room air.  WBC 10.2, hemoglobin 11.9, platelet count 169.  Sodium 135, potassium 4.3, chloride 99, CO2 21, glucose 105, BUN 24, creat 1.12.  D-dimer elevated 5.40.  High-sensitivity troponin 36 followed by 34.  BNP 198.0.  Chest x-ray with no acute process, hyperinflation.  CT angiogram chest negative for pulmonary embolism and no acute abnormality within the chest.  Patient was given IV magnesium , Solu-Medrol  and DuoNeb.  TRH was consulted for admission and patient was transferred to [facility] for further evaluation and management of COPD exacerbation.  Assessment & Plan:   Acute COPD exacerbation Acute hypoxic Respiratory Failure, POA Patient presenting with 3-week history of progressive shortness of breath despite oral steroid antibiotic use outpatient.  Patient was afebrile without leukocytosis.  BNP within normal limits.  Chest x-ray with no acute process.  D-dimer elevated 5.40 with CT angiogram chest negative for pulmonary embolism or other acute abnormality within the chest.  TTE with LVEF 50-55%, no LV regional wall motion normalities, grade 1 diastolic dysfunction, no aortic stenosis, IVC normal. -- Breztri  2 puffs every 12 hours -- Xopenex  neb twice daily -- Prednisone  40  mg p.o. daily -- Albuterol  neb every 2 hours as needed wheezing/shortness of breath -- Continue supplemental oxygen , maintain SPO2 greater than 88%; oxygen  now weaned off  CKD stage IIIa Creatinine 0.96, stable.  Hypothyroidism -- Levothyroxine  88 mcg p.o. daily  Depression -- Zoloft  50 mg p.o. nightly  -- Bupropion  3 mg p.o. nightly  HTN -- Metoprolol  succinate 25 mg p.o. at bedtime -- Furosemide  20 mg p.o. daily -- Holding home valsartan  40 mg p.o. daily for now  HLD -- Crestor  10 mg p.o. nightly  Parkinson disease -- Sinemet  IR 25-100 mg p.o. 3 times daily  DVT prophylaxis: enoxaparin  (LOVENOX ) injection 40 mg Start: 07/23/23 1000    Code Status: Full Code Family Communication: No family present at bedside this morning  Disposition Plan:  Level of care: Med-Surg Status is: Inpatient Remains inpatient appropriate because: Pending SNF placement    Consultants:  None  Procedures:  TTE  Antimicrobials:  None   Subjective: Patient seen examined bedside, lying in bed.  Reports dyspnea improved with breathing treatment this morning.  Remains off of oxygen .  Awaiting SNF placement.  No other questions, concerns or complaints at this time. Denies headache, no dizziness, no chest pain, no palpitations, no fever/chills/night sweats, no nausea/vomit/diarrhea, no focal weakness, no fatigue, no paresthesias.  No acute events overnight per nursing staff.  Objective: Vitals:   07/27/23 0559 07/27/23 0839 07/27/23 1007 07/27/23 1321  BP: 133/70  (!) 149/79 117/73  Pulse: 79  91 87  Resp: 18   17  Temp: 98.1 F (36.7 C)   98 F (36.7 C)  TempSrc:    Oral  SpO2: 96%  94%  96%  Weight:      Height:        Intake/Output Summary (Last 24 hours) at 07/27/2023 1359 Last data filed at 07/27/2023 1300 Gross per 24 hour  Intake 720 ml  Output --  Net 720 ml   Filed Weights   07/23/23 0523 07/23/23 0621  Weight: 76.7 kg 76.4 kg    Examination:  Physical Exam: GEN:  NAD, alert and oriented x 3, wd/wn HEENT: NCAT, PERRL, EOMI, sclera clear, MMM PULM: CTAB w/o wheezes/crackles, normal respiratory effort, on room air with SpO2 94% at rest  CV: RRR w/o M/G/R GI: abd soft, NTND, + BS NEURO: no focal deficits, sensation to light touch intact PSYCH: normal mood/affect Integumentary: Areas of ecchymosis in various stages of healing bilateral lower extremities, no other concerning rashes/lesions/wounds noted on exposed skin surfaces     Data Reviewed: I have personally reviewed following labs and imaging studies  CBC: Recent Labs  Lab 07/22/23 1813 07/22/23 2334 07/24/23 0637 07/25/23 0604 07/26/23 0542  WBC 10.2  --  14.1* 13.0* 11.1*  HGB 11.9* 11.9* 10.8* 10.9* 10.8*  HCT 36.4 35.0* 33.9* 34.9* 33.6*  MCV 95.8  --  98.3 98.6 97.4  PLT 169  --  156 182 180   Basic Metabolic Panel: Recent Labs  Lab 07/22/23 1813 07/22/23 2334 07/24/23 0637 07/25/23 0604 07/26/23 0542  NA 135 135 133* 135 132*  K 4.3 4.4 4.3 4.2 4.5  CL 99  --  102 103 101  CO2 21*  --  24 22 22   GLUCOSE 105*  --  162* 156* 154*  BUN 24*  --  27* 29* 40*  CREATININE 1.12*  --  0.99 0.96 0.95  CALCIUM  9.1  --  8.8* 9.1 8.9   GFR: Estimated Creatinine Clearance: 58.2 mL/min (by C-G formula based on SCr of 0.95 mg/dL). Liver Function Tests: No results for input(s): AST, ALT, ALKPHOS, BILITOT, PROT, ALBUMIN in the last 168 hours. No results for input(s): LIPASE, AMYLASE in the last 168 hours. No results for input(s): AMMONIA in the last 168 hours. Coagulation Profile: No results for input(s): INR, PROTIME in the last 168 hours. Cardiac Enzymes: No results for input(s): CKTOTAL, CKMB, CKMBINDEX, TROPONINI in the last 168 hours. BNP (last 3 results) Recent Labs    07/22/23 1811  PROBNP 198.0   HbA1C: No results for input(s): HGBA1C in the last 72 hours. CBG: No results for input(s): GLUCAP in the last 168 hours. Lipid  Profile: No results for input(s): CHOL, HDL, LDLCALC, TRIG, CHOLHDL, LDLDIRECT in the last 72 hours. Thyroid  Function Tests: No results for input(s): TSH, T4TOTAL, FREET4, T3FREE, THYROIDAB in the last 72 hours.  Anemia Panel: No results for input(s): VITAMINB12, FOLATE, FERRITIN, TIBC, IRON, RETICCTPCT in the last 72 hours. Sepsis Labs: No results for input(s): PROCALCITON, LATICACIDVEN in the last 168 hours.  No results found for this or any previous visit (from the past 240 hours).       Radiology Studies: No results found.       Scheduled Meds:  budesonide -glycopyrrolate -formoterol   2 puff Inhalation Q12H   buPROPion   300 mg Oral QHS   carbidopa -levodopa   1 tablet Oral TID   enoxaparin  (LOVENOX ) injection  40 mg Subcutaneous Q24H   feeding supplement  237 mL Oral BID BM   furosemide   20 mg Oral Daily   levalbuterol   0.63 mg Nebulization BID   levothyroxine   88 mcg Oral Q0600   melatonin  5 mg Oral  QHS   metoprolol  succinate  25 mg Oral QHS   predniSONE   40 mg Oral Q breakfast   rosuvastatin   10 mg Oral QHS   sertraline   50 mg Oral QHS   Continuous Infusions:   LOS: 3 days    Time spent: 48 minutes spent on 07/27/2023 caring for this patient face-to-face including chart review, ordering labs/tests, documenting, discussion with nursing staff, consultants, updating family and interview/physical exam    Camellia PARAS [place], DO [facility] Available via Epic secure chat 7am-7pm After these hours, please refer to coverage provider listed on [email or web address] 07/27/2023, 1:59 PM

## 2023-07-28 DIAGNOSIS — N179 Acute kidney failure, unspecified: Secondary | ICD-10-CM | POA: Diagnosis not present

## 2023-07-28 DIAGNOSIS — R2681 Unsteadiness on feet: Secondary | ICD-10-CM | POA: Diagnosis not present

## 2023-07-28 DIAGNOSIS — R0602 Shortness of breath: Secondary | ICD-10-CM | POA: Diagnosis not present

## 2023-07-28 DIAGNOSIS — N1831 Chronic kidney disease, stage 3a: Secondary | ICD-10-CM | POA: Diagnosis not present

## 2023-07-28 DIAGNOSIS — R1312 Dysphagia, oropharyngeal phase: Secondary | ICD-10-CM | POA: Diagnosis not present

## 2023-07-28 DIAGNOSIS — M353 Polymyalgia rheumatica: Secondary | ICD-10-CM | POA: Diagnosis not present

## 2023-07-28 DIAGNOSIS — R52 Pain, unspecified: Secondary | ICD-10-CM | POA: Diagnosis not present

## 2023-07-28 DIAGNOSIS — R053 Chronic cough: Secondary | ICD-10-CM | POA: Diagnosis not present

## 2023-07-28 DIAGNOSIS — I5032 Chronic diastolic (congestive) heart failure: Secondary | ICD-10-CM | POA: Diagnosis not present

## 2023-07-28 DIAGNOSIS — G20B2 Parkinson's disease with dyskinesia, with fluctuations: Secondary | ICD-10-CM | POA: Diagnosis not present

## 2023-07-28 DIAGNOSIS — J449 Chronic obstructive pulmonary disease, unspecified: Secondary | ICD-10-CM | POA: Diagnosis not present

## 2023-07-28 DIAGNOSIS — M6281 Muscle weakness (generalized): Secondary | ICD-10-CM | POA: Diagnosis not present

## 2023-07-28 DIAGNOSIS — R531 Weakness: Secondary | ICD-10-CM | POA: Diagnosis not present

## 2023-07-28 DIAGNOSIS — J441 Chronic obstructive pulmonary disease with (acute) exacerbation: Secondary | ICD-10-CM | POA: Diagnosis not present

## 2023-07-28 DIAGNOSIS — J9601 Acute respiratory failure with hypoxia: Secondary | ICD-10-CM | POA: Diagnosis not present

## 2023-07-28 DIAGNOSIS — I110 Hypertensive heart disease with heart failure: Secondary | ICD-10-CM | POA: Diagnosis not present

## 2023-07-28 DIAGNOSIS — R062 Wheezing: Secondary | ICD-10-CM | POA: Diagnosis not present

## 2023-07-28 DIAGNOSIS — D513 Other dietary vitamin B12 deficiency anemia: Secondary | ICD-10-CM | POA: Diagnosis not present

## 2023-07-28 DIAGNOSIS — Z23 Encounter for immunization: Secondary | ICD-10-CM | POA: Diagnosis present

## 2023-07-28 DIAGNOSIS — Z7401 Bed confinement status: Secondary | ICD-10-CM | POA: Diagnosis not present

## 2023-07-28 DIAGNOSIS — J45909 Unspecified asthma, uncomplicated: Secondary | ICD-10-CM | POA: Diagnosis not present

## 2023-07-28 DIAGNOSIS — E54 Ascorbic acid deficiency: Secondary | ICD-10-CM | POA: Diagnosis not present

## 2023-07-28 DIAGNOSIS — R600 Localized edema: Secondary | ICD-10-CM | POA: Diagnosis not present

## 2023-07-28 DIAGNOSIS — E039 Hypothyroidism, unspecified: Secondary | ICD-10-CM | POA: Diagnosis not present

## 2023-07-28 DIAGNOSIS — R059 Cough, unspecified: Secondary | ICD-10-CM | POA: Diagnosis not present

## 2023-07-28 DIAGNOSIS — M25572 Pain in left ankle and joints of left foot: Secondary | ICD-10-CM | POA: Diagnosis not present

## 2023-07-28 DIAGNOSIS — R296 Repeated falls: Secondary | ICD-10-CM | POA: Diagnosis not present

## 2023-07-28 DIAGNOSIS — I130 Hypertensive heart and chronic kidney disease with heart failure and stage 1 through stage 4 chronic kidney disease, or unspecified chronic kidney disease: Secondary | ICD-10-CM | POA: Diagnosis not present

## 2023-07-28 DIAGNOSIS — W19XXXA Unspecified fall, initial encounter: Secondary | ICD-10-CM | POA: Diagnosis not present

## 2023-07-28 DIAGNOSIS — K5909 Other constipation: Secondary | ICD-10-CM | POA: Diagnosis not present

## 2023-07-28 DIAGNOSIS — I5030 Unspecified diastolic (congestive) heart failure: Secondary | ICD-10-CM | POA: Diagnosis not present

## 2023-07-28 DIAGNOSIS — K219 Gastro-esophageal reflux disease without esophagitis: Secondary | ICD-10-CM | POA: Diagnosis not present

## 2023-07-28 DIAGNOSIS — Z743 Need for continuous supervision: Secondary | ICD-10-CM | POA: Diagnosis not present

## 2023-07-28 DIAGNOSIS — G4700 Insomnia, unspecified: Secondary | ICD-10-CM | POA: Diagnosis not present

## 2023-07-28 DIAGNOSIS — E559 Vitamin D deficiency, unspecified: Secondary | ICD-10-CM | POA: Diagnosis not present

## 2023-07-28 DIAGNOSIS — E782 Mixed hyperlipidemia: Secondary | ICD-10-CM | POA: Diagnosis not present

## 2023-07-28 LAB — BASIC METABOLIC PANEL WITH GFR
Anion gap: 10 (ref 5–15)
BUN: 41 mg/dL — ABNORMAL HIGH (ref 8–23)
CO2: 27 mmol/L (ref 22–32)
Calcium: 9.4 mg/dL (ref 8.9–10.3)
Chloride: 100 mmol/L (ref 98–111)
Creatinine, Ser: 1.07 mg/dL — ABNORMAL HIGH (ref 0.44–1.00)
GFR, Estimated: 56 mL/min — ABNORMAL LOW (ref >60)
Glucose, Bld: 87 mg/dL (ref 70–99)
Potassium: 4.2 mmol/L (ref 3.5–5.1)
Sodium: 137 mmol/L (ref 135–145)

## 2023-07-28 LAB — CBC
HCT: 37 % (ref 36.0–46.0)
Hemoglobin: 12 g/dL (ref 12.0–15.0)
MCH: 31.9 pg (ref 26.0–34.0)
MCHC: 32.4 g/dL (ref 30.0–36.0)
MCV: 98.4 fL (ref 80.0–100.0)
Platelets: 204 K/uL (ref 150–400)
RBC: 3.76 MIL/uL — ABNORMAL LOW (ref 3.87–5.11)
RDW: 13.6 % (ref 11.5–15.5)
WBC: 10.8 K/uL — ABNORMAL HIGH (ref 4.0–10.5)
nRBC: 0 % (ref 0.0–0.2)

## 2023-07-28 MED ORDER — PREDNISONE 10 MG PO TABS: ORAL_TABLET | ORAL | Status: AC

## 2023-07-28 MED ORDER — ENSURE PLUS HIGH PROTEIN PO LIQD: 237.0000 mL | Freq: Two times a day (BID) | ORAL | Status: DC

## 2023-07-28 MED ORDER — MELATONIN 5 MG PO TABS: 5.0000 mg | ORAL_TABLET | Freq: Every day | ORAL | Status: DC

## 2023-07-28 NOTE — Hospital Course (Addendum)
 68 y.o. female with past medical history significant for COPD not oxygen  dependent, Parkinson's disease, nonischemic cardiomyopathy, CKD stage IIIa who presented to [facility] ED on 07/22/2023 with shortness of breath and noted hypoxia with SpO2 82% on room air at home.    Upon evaluation in the emergency department patient was felt to clinically be suffering from a COPD exacerbation.  Hospitalist was called and patient was accepted for transfer to [facility] for continued medical care.  Patient was initiated on systemic steroids aggressive bronchodilator therapy and supplemental oxygen .  In the days that followed patient gradually clinically improved.  Patient was weaned off of supplemental oxygen  and was able to ambulate without any evidence of hypoxia.  Patient was transition to oral prednisone  therapy.    Patient was evaluated by physical therapy and is felt the patient would benefit from skilled physical therapy in a skilled nursing facility.  [facility] was [facility] to [name] facility on 07/28/2023 and improved in stable condition.

## 2023-07-28 NOTE — TOC Transition Note (Addendum)
 Transition of Care [facility]) - Discharge Note   Patient Details  Name: April [name] MRN: [id number] Date of Birth: [birth year]  [facility] CM/SW Contact:  Toy LITTIE Agar, RN Phone Number:[phone]  07/28/2023, 10:37 AM   Clinical Narrative:    Insurance shara has been approved Plan auth ID [id number] Auth ID # [id number] 7/22-7/24. CM spoke with Soy in admissions at [facility]. Soy is ok with accepting patient today.. MD made aware.   1300 Transportation has been arranged per PTAR. No other TOC needs noted. TOC will sign off.      Barriers to Discharge: Continued Medical Work up   Patient Goals and CMS Choice            Discharge Placement                       Discharge Plan and Services Additional resources added to the After Visit Summary for                                       Social Drivers of Health (SDOH) Interventions SDOH Screenings   Food Insecurity: No Food Insecurity (07/23/2023)  Housing: High Risk (07/23/2023)  Transportation Needs: No Transportation Needs (07/23/2023)  Utilities: Not At Risk (07/23/2023)  Alcohol  Screen: Low Risk  (11/26/2021)  Depression (PHQ2-9): Low Risk  (04/08/2023)  Financial Resource Strain: Medium Risk (12/01/2022)  Physical Activity: Insufficiently Active (12/01/2022)  Social Connections: Socially Isolated (07/23/2023)  Stress: No Stress Concern Present (12/01/2022)  Tobacco Use: Medium Risk (07/22/2023)  Health Literacy: Adequate Health Literacy (12/01/2022)     Readmission Risk Interventions     No data to display

## 2023-07-28 NOTE — Plan of Care (Signed)

## 2023-07-28 NOTE — Discharge Instructions (Addendum)
 Patient is full code  Patient may get out of bed to with assistance using an assistive device Patient to be ministered all prescribed medications exactly as instructed. Patient receives a low-sodium low carbohydrate diet Follow-up with facility provider per protocol.   Please bring patient back to the emergency department if he develops sudden is in mentation, shortness of breath,  fevers of greater than 100.4 F or weakness.

## 2023-07-28 NOTE — NC FL2 (Signed)
 [place]  MEDICAID FL2 LEVEL OF CARE FORM     IDENTIFICATION  Patient Name: April [name] Birthdate: 11/25/1955 Sex: female Admission Date (Current Location): 07/22/2023  [facility] and [place] Number:  Producer, television/film/video and Address:  [facility],  [address] [place], [place] [address]      Provider Number: [id number]  Attending Physician Name and Address:  Kenard Zachary PARAS, MD  Relative Name and Phone Number:  Montgomery Beal [phone]    Current Level of Care: Hospital Recommended Level of Care: Skilled Nursing Facility Prior Approval Number:    Date Approved/Denied:   PASRR Number: [id number]  Discharge Plan: SNF    Current Diagnoses: Patient Active Problem List   Diagnosis Date Noted   Acute exacerbation of COPD with asthma (HCC) 07/24/2023   Acute exacerbation of chronic obstructive pulmonary disease (COPD) (HCC) 07/23/2023   Atrophy of skin due to systemic corticosteroid 04/08/2023   Parkinson's disease with dyskinesia and fluctuating manifestations (HCC) 04/08/2023   Bilateral lower extremity edema 05/27/2022   Frequent falls 03/05/2022   Serrated polyp of colon 03/05/2022   Mixed hyperlipidemia 12/03/2021   Unsteady gait 07/20/2021   Chronic diastolic CHF (congestive heart failure) (HCC) 06/13/2021   Upper airway cough syndrome 05/07/2021   Osteoporosis 03/23/2021   Allergic rhinitis 04/06/2019   Aortic atherosclerosis (HCC) 12/17/2017   Pulmonary nodule 12/17/2017   GERD (gastroesophageal reflux disease) 12/12/2017   COPD, severe (HCC) 11/24/2017   CKD (chronic kidney disease), stage III (HCC) 10/28/2017   Vertigo 10/27/2017   Essential hypertension 06/23/2017   Polymyalgia rheumatica (HCC) 05/22/2016   History of bilateral hip replacements 05/22/2016   DDD (degenerative disc disease), lumbar 05/22/2016   History of avascular necrosis of capital femoral epiphysis 08/14/2014   Asthma 03/23/2010   Acquired hypothyroidism 03/21/2010    Chronic recurrent major depressive disorder (HCC) 03/21/2010   Former smoker 03/21/2010   History of temporal arteritis 03/21/2010    Orientation RESPIRATION BLADDER Height & Weight     Self, Situation, Time, Place  Normal Continent Weight: 76.4 kg Height:  5' 6.5 (168.9 cm)  BEHAVIORAL SYMPTOMS/MOOD NEUROLOGICAL BOWEL NUTRITION STATUS     (n/a) Continent Diet (Heart healthy)  AMBULATORY STATUS COMMUNICATION OF NEEDS Skin   Limited Assist Verbally Bruising (scattered bruising noted to hip , arm , buttocks)                       Personal Care Assistance Level of Assistance  Bathing, Feeding, Dressing Bathing Assistance: Limited assistance Feeding assistance: Independent Dressing Assistance: Limited assistance     Functional Limitations Info  Sight, Hearing, Speech Sight Info: Adequate Hearing Info: Adequate Speech Info: Adequate    SPECIAL CARE FACTORS FREQUENCY  PT (By licensed PT), OT (By licensed OT)     PT Frequency: 5x/wk OT Frequency: 5x/wk            Contractures Contractures Info: Not present    Additional Factors Info  Code Status, Allergies, Psychotropic, Insulin Sliding Scale, Isolation Precautions, Suctioning Needs Code Status Info: Full Allergies Info: Sulfonamide Derivatives, Alendronate Sodium, Dilaudid  (Hydromorphone  Hcl), Sulfa Antibiotics, Other, Wound Dressing Adhesive Psychotropic Info: n/a see discharge summary Insulin Sliding Scale Info: n/a  see discharge summary Isolation Precautions Info: n/a Suctioning Needs: n/a   Current Medications (07/28/2023):  This is the current hospital active medication list Current Facility-Administered Medications  Medication Dose Route Frequency Provider Last Rate Last Admin   acetaminophen  (TYLENOL ) tablet 650 mg  650 mg Oral  Q6H PRN Zella Katha HERO, MD   650 mg at 07/27/23 9184   Or   acetaminophen  (TYLENOL ) suppository 650 mg  650 mg Rectal Q6H PRN Zella, Mir M, MD       albuterol  (PROVENTIL )  (2.5 MG/3ML) 0.083% nebulizer solution 2.5 mg  2.5 mg Nebulization Q2H PRN Zella, Mir M, MD       budesonide -glycopyrrolate -formoterol  (BREZTRI ) 160-9-4.8 MCG/ACT inhaler 2 puff  2 puff Inhalation Q12H Zella, Mir M, MD   2 puff at 07/28/23 9247   buPROPion  (WELLBUTRIN  XL) 24 hr tablet 300 mg  300 mg Oral QHS [place], Eric J, DO   300 mg at 07/27/23 2117   carbidopa -levodopa  (SINEMET  IR) 25-100 MG per tablet immediate release 1 tablet  1 tablet Oral TID Will Almarie MATSU, MD   1 tablet at 07/28/23 1203   enoxaparin  (LOVENOX ) injection 40 mg  40 mg Subcutaneous Q24H Zella, Mir M, MD   40 mg at 07/28/23 0955   feeding supplement (ENSURE PLUS HIGH PROTEIN) liquid 237 mL  237 mL Oral BID BM Zella, Mir M, MD   237 mL at 07/28/23 0955   furosemide  (LASIX ) tablet 20 mg  20 mg Oral Daily [place], Eric J, DO   20 mg at 07/28/23 9044   levalbuterol  (XOPENEX ) nebulizer solution 0.63 mg  0.63 mg Nebulization BID Mathews, Elizabeth G, MD   0.63 mg at 07/28/23 0750   levothyroxine  (SYNTHROID ) tablet 88 mcg  88 mcg Oral Q0600 [place], Eric J, DO   88 mcg at 07/28/23 9491   melatonin tablet 5 mg  5 mg Oral QHS [place], Eric J, DO   5 mg at 07/27/23 2117   metoprolol  succinate (TOPROL -XL) 24 hr tablet 25 mg  25 mg Oral QHS [place], Eric J, DO   25 mg at 07/27/23 2116   ondansetron  (ZOFRAN ) tablet 4 mg  4 mg Oral Q6H PRN Zella Katha HERO, MD       Or   ondansetron  (ZOFRAN ) injection 4 mg  4 mg Intravenous Q6H PRN Zella, Mir M, MD       predniSONE  (DELTASONE ) tablet 40 mg  40 mg Oral Q breakfast [place], Eric J, DO   40 mg at 07/28/23 0813   rosuvastatin  (CRESTOR ) tablet 10 mg  10 mg Oral QHS Will Almarie MATSU, MD   10 mg at 07/27/23 2117   sertraline  (ZOLOFT ) tablet 50 mg  50 mg Oral QHS Mathews, Elizabeth G, MD   50 mg at 07/27/23 2117     Discharge Medications: Please see discharge summary for a list of discharge medications.  Relevant Imaging Results:  Relevant Lab  Results:   Additional Information SS# [id number]  Toy LITTIE Agar, RN

## 2023-07-28 NOTE — Discharge Summary (Addendum)
 Physician Discharge Summary   Patient: April [name] MRN: [id number] DOB: 06/12/1955  Admit date:     07/22/2023  Discharge date: 07/28/23  Discharge Physician: Zachary JINNY Ba   PCP: Tonnie Jon SAILOR, NP   Recommendations at discharge:   Patient is full code  Patient may get out of bed to with assistance using an assistive device Patient to be ministered all prescribed medications exactly as instructed. Patient receives a low-sodium low carbohydrate diet Follow-up with facility provider per protocol.   Please bring patient back to the emergency department if he develops sudden is in mentation, shortness of breath,  fevers of greater than 100.4 F or weakness.    Discharge Diagnoses: Principal Problem:   Acute exacerbation of chronic obstructive pulmonary disease (COPD) (HCC) Active Problems:   Acute exacerbation of COPD with asthma (HCC)  Resolved Problems:   * No resolved hospital problems. *   Hospital Course: 68 y.o. female with past medical history significant for COPD not oxygen  dependent, Parkinson's disease, nonischemic cardiomyopathy, CKD stage IIIa who presented to [facility] ED on 07/22/2023 with shortness of breath and noted hypoxia with SpO2 82% on room air at home.    Upon evaluation in the emergency department patient was felt to clinically be suffering from a COPD exacerbation.  Hospitalist was called and patient was accepted for transfer to [facility] for continued medical care.  Patient was initiated on systemic steroids aggressive bronchodilator therapy and supplemental oxygen .  In the days that followed patient gradually clinically improved.  Patient was weaned off of supplemental oxygen  and was able to ambulate without any evidence of hypoxia.  Patient was transition to oral prednisone  therapy.    Patient was evaluated by physical therapy and is felt the patient would benefit from skilled physical therapy in a skilled nursing facility.   [facility] was [facility] on 07/28/2023 and improved in stable condition.      Pain control - [place]  Controlled Substance Reporting System database was reviewed. and patient was instructed, not to drive, operate heavy machinery, perform activities at heights, swimming or participation in water activities or provide baby-sitting services while on Pain, Sleep and Anxiety Medications; until their outpatient Physician has advised to do so again. Also recommended to not to take more than prescribed Pain, Sleep and Anxiety Medications.   Consultants: none Procedures performed: none  Disposition: Skilled nursing facility Diet recommendation:  Discharge Diet Orders (From admission, onward)     Start     Ordered   07/28/23 0000  Diet - low sodium heart healthy        07/28/23 1148           Cardiac diet  DISCHARGE MEDICATION: Allergies as of 07/28/2023       Reactions   Sulfonamide Derivatives Anaphylaxis, Rash   As a child.  Throat closed, rash.   Alendronate Sodium Other (See Comments)   GI upset   Dilaudid  [hydromorphone  Hcl] Nausea And Vomiting   Sulfa Antibiotics Hives   Other Other (See Comments)   Adhesive. Redness    Wound Dressing Adhesive Other (See Comments)   Redness, adhesive tapes. Needs PAPER TAPE.        Medication List     STOP taking these medications    albuterol  108 (90 Base) MCG/ACT inhaler Commonly known as: VENTOLIN  HFA   amoxicillin  500 MG capsule Commonly known as: AMOXIL    amoxicillin -clavulanate 875-125 MG tablet Commonly known as: AUGMENTIN   doxycycline  100 MG capsule Commonly known as: VIBRAMYCIN    methocarbamol  750 MG tablet Commonly known as: ROBAXIN    oxyCODONE  5 MG immediate release tablet Commonly known as: Roxicodone    valsartan  40 MG tablet Commonly known as: DIOVAN    vitamin C 250 MG tablet Commonly known as: ASCORBIC ACID       TAKE these medications    acetaminophen   650 MG CR tablet Commonly known as: TYLENOL  Take 1,300 mg by mouth at bedtime as needed for pain.   azithromycin  250 MG tablet Commonly known as: ZITHROMAX  Take 1 tablet (250 mg total) by mouth every Monday, Wednesday, and Friday.   Breztri  Aerosphere 160-9-4.8 MCG/ACT Aero inhaler Generic drug: budesonide -glycopyrrolate -formoterol  Inhale 2 puffs into the lungs every 12 (twelve) hours.   buPROPion  300 MG 24 hr tablet Commonly known as: WELLBUTRIN  XL TAKE 1 TABLET BY MOUTH DAILY What changed: when to take this   Calcium  500 MG Chew Chew 500 mg by mouth 2 (two) times daily.   carbidopa -levodopa  25-100 MG tablet Commonly known as: SINEMET  IR Take 1 tablet by mouth 3 (three) times daily.   feeding supplement Liqd Take 237 mLs by mouth 2 (two) times daily between meals.   furosemide  20 MG tablet Commonly known as: LASIX  TAKE 1 TABLET BY MOUTH DAILY AS NEEDED FOR EDEMA What changed: See the new instructions.   levothyroxine  88 MCG tablet Commonly known as: SYNTHROID  Take 88 mcg by mouth daily before breakfast. What changed: Another medication with the same name was removed. Continue taking this medication, and follow the directions you see here.   melatonin 5 MG Tabs Take 1 tablet (5 mg total) by mouth at bedtime.   metoprolol  succinate 25 MG 24 hr tablet Commonly known as: TOPROL -XL Take 1 tablet (25 mg total) by mouth daily. What changed: when to take this   omeprazole  20 MG capsule Commonly known as: PRILOSEC TAKE 1 CAPSULE BY MOUTH EVERY DAY What changed:  how much to take when to take this   potassium chloride  10 MEQ tablet Commonly known as: KLOR-CON  M Take 1 tablet (10 mEq total) by mouth daily as needed (when you take the lasix ). What changed: when to take this   predniSONE  10 MG tablet Commonly known as: DELTASONE  Take 4 tablets (40 mg total) by mouth daily for 3 days, THEN 3 tablets (30 mg total) daily for 3 days, THEN 2 tablets (20 mg total) daily for 3  days, THEN 1 tablet (10 mg total) daily for 3 days. Start taking on: July 28, 2023 What changed: See the new instructions.   rosuvastatin  10 MG tablet Commonly known as: CRESTOR  Take 1 tablet (10 mg total) by mouth at bedtime.   sertraline  50 MG tablet Commonly known as: ZOLOFT  Take 50 mg by mouth at bedtime.   SYSTANE OP Place 2 drops into both eyes daily.   VITAMIN B 12 PO Take 1 capsule by mouth daily.   Vitamin D -3 125 MCG (5000 UT) Tabs Take 5,000 Units by mouth daily.        Follow-up Information     [facility]   Specialty: Skilled Nursing Facility Contact information: [address] [facility] [place]  [address] [phone]                Discharge Exam: Filed Weights   07/23/23 0523 07/23/23 0621  Weight: 76.7 kg 76.4 kg    Constitutional: Awake alert and oriented x3, no associated distress.   Respiratory: Mild intermittent expiratory wheezing  heard in all lung fields.  No crackles. Normal respiratory effort. No accessory muscle use.  Cardiovascular: Regular rate and rhythm, no murmurs / rubs / gallops. No extremity edema. 2+ pedal pulses. No carotid bruits.  Abdomen: Abdomen is soft and nontender.  No evidence of intra-abdominal masses.  Positive bowel sounds noted in all quadrants.   Musculoskeletal: No joint deformity upper and lower extremities. Good ROM, no contractures. Normal muscle tone.     Condition at discharge: fair  The results of significant diagnostics from this hospitalization (including imaging, microbiology, ancillary and laboratory) are listed below for reference.   Imaging Studies: ECHOCARDIOGRAM COMPLETE Result Date: 07/24/2023    ECHOCARDIOGRAM REPORT   Patient Name:   April [name] Date of Exam: 07/24/2023 Medical Rec #:  [id number]        Height:       66.5 in Accession #:    [id number]       Weight:       168.4 lb Date of Birth:  [birth year]         BSA:          1.869 m Patient Age:    68 years          BP:           117/65 mmHg Patient Gender: F                HR:           107 bpm. Exam Location:  Inpatient Procedure: 2D Echo, Color Doppler and Cardiac Doppler (Both Spectral and Color            Flow Doppler were utilized during procedure). Indications:    Abnormal ECG R94.31  History:        Patient has prior history of Echocardiogram examinations, most                 recent 06/10/2021. CAD, Abnormal ECG, COPD and CKD,                 Signs/Symptoms:Shortness of Breath and Dyspnea; Risk                 Factors:Hypertension, Dyslipidemia and Former Smoker.  Sonographer:    Koleen Popper RDCS Referring Phys: [id number] ALMARIE MATSU MATHEWS  Sonographer Comments: Technically challenging study due to limited acoustic windows. Image acquisition challenging due to COPD and Image acquisition challenging due to respiratory motion. IMPRESSIONS  1. Left ventricular ejection fraction, by estimation, is 50 to 55%. The left ventricle has low normal function. The left ventricle has no regional wall motion abnormalities. Left ventricular diastolic parameters are consistent with Grade I diastolic dysfunction (impaired relaxation).  2. Right ventricular systolic function is normal. The right ventricular size is normal. There is normal pulmonary artery systolic pressure.  3. The mitral valve is normal in structure. No evidence of mitral valve regurgitation. No evidence of mitral stenosis.  4. The aortic valve is normal in structure. Aortic valve regurgitation is not visualized. No aortic stenosis is present.  5. The inferior vena cava is normal in size with greater than 50% respiratory variability, suggesting right atrial pressure of 3 mmHg. FINDINGS  Left Ventricle: Left ventricular ejection fraction, by estimation, is 50 to 55%. The left ventricle has low normal function. The left ventricle has no regional wall motion abnormalities. The left ventricular internal cavity size was normal in size. There is no left ventricular  hypertrophy. Left ventricular diastolic parameters are consistent with Grade  I diastolic dysfunction (impaired relaxation). Right Ventricle: The right ventricular size is normal. No increase in right ventricular wall thickness. Right ventricular systolic function is normal. There is normal pulmonary artery systolic pressure. The tricuspid regurgitant velocity is 2.10 m/s, and  with an assumed right atrial pressure of 3 mmHg, the estimated right ventricular systolic pressure is 20.6 mmHg. Left Atrium: Left atrial size was normal in size. Right Atrium: Right atrial size was normal in size. Pericardium: Trivial pericardial effusion is present. The pericardial effusion is circumferential. Presence of epicardial fat layer. Mitral Valve: The mitral valve is normal in structure. No evidence of mitral valve regurgitation. No evidence of mitral valve stenosis. Tricuspid Valve: The tricuspid valve is normal in structure. Tricuspid valve regurgitation is not demonstrated. No evidence of tricuspid stenosis. Aortic Valve: The aortic valve is normal in structure. Aortic valve regurgitation is not visualized. No aortic stenosis is present. Pulmonic Valve: The pulmonic valve was normal in structure. Pulmonic valve regurgitation is not visualized. No evidence of pulmonic stenosis. Aorta: The aortic root is normal in size and structure. Venous: The inferior vena cava is normal in size with greater than 50% respiratory variability, suggesting right atrial pressure of 3 mmHg. IAS/Shunts: No atrial level shunt detected by color flow Doppler.  LEFT VENTRICLE PLAX 2D LVIDd:         4.30 cm   Diastology LVIDs:         3.20 cm   LV e' medial:    6.53 cm/s LV PW:         0.90 cm   LV E/e' medial:  10.6 LV IVS:        0.90 cm   LV e' lateral:   10.40 cm/s LVOT diam:     1.80 cm   LV E/e' lateral: 6.6 LV SV:         44 LV SV Index:   24 LVOT Area:     2.54 cm  IVC IVC diam: 1.60 cm LEFT ATRIUM         Index LA diam:    2.80 cm 1.50 cm/m   AORTIC VALVE LVOT Vmax:   108.00 cm/s LVOT Vmean:  75.500 cm/s LVOT VTI:    0.174 m  AORTA Ao Root diam: 3.00 cm MITRAL VALVE                TRICUSPID VALVE MV Area (PHT): 4.96 cm     TR Peak grad:   17.6 mmHg MV Decel Time: 153 msec     TR Vmax:        210.00 cm/s MV E velocity: 68.90 cm/s MV A velocity: 110.00 cm/s  SHUNTS MV E/A ratio:  0.63         Systemic VTI:  0.17 m                             Systemic Diam: 1.80 cm Kardie Tobb DO Electronically signed by Dub Huntsman DO Signature Date/Time: 07/24/2023/1:51:54 PM    Final    CT Angio Chest PE W and/or Wo Contrast Result Date: 07/22/2023 CLINICAL DATA:  Positive D-dimer and shortness of breath EXAM: CT ANGIOGRAPHY CHEST WITH CONTRAST TECHNIQUE: Multidetector CT imaging of the chest was performed using the standard protocol during bolus administration of intravenous contrast. Multiplanar CT image reconstructions and MIPs were obtained to evaluate the vascular anatomy. RADIATION DOSE REDUCTION: This exam was performed according to the departmental dose-optimization program which includes  automated exposure control, adjustment of the mA and/or kV according to patient size and/or use of iterative reconstruction technique. CONTRAST:  75mL OMNIPAQUE  IOHEXOL  350 MG/ML SOLN COMPARISON:  Chest x-ray from earlier in the same day. FINDINGS: Cardiovascular: Atherosclerotic calcifications of the thoracic aorta are noted. No aneurysmal dilatation or dissection is noted. Heart is not significantly enlarged. Pulmonary artery is well visualized within normal branching pattern bilaterally. No filling defect to suggest pulmonary embolism is noted. No significant coronary calcifications are noted. Mediastinum/Nodes: Thoracic inlet is within normal limits. No hilar or mediastinal adenopathy is noted. The esophagus as visualized is within normal limits. Lungs/Pleura: Emphysematous changes of the lower lungs are seen. No focal infiltrate or sizable effusion is noted. Scarring in  the right apex is again identified. No other focal abnormality is noted. Upper Abdomen: Gallbladder has been surgically removed. Musculoskeletal: Degenerative changes of the thoracic spine are noted. Review of the MIP images confirms the above findings. IMPRESSION: No evidence of pulmonary emboli. No acute abnormality noted. Aortic Atherosclerosis (ICD10-I70.0) and Emphysema (ICD10-J43.9). Electronically Signed   By: Oneil Devonshire M.D.   On: 07/22/2023 22:40   DG Chest 2 View Result Date: 07/22/2023 EXAM: 2 VIEW(S) XRAY OF THE CHEST 07/22/2023 06:23:00 PM COMPARISON: Dry 16 2025 CLINICAL HISTORY: SOB. Was seen yesterday for same started she felt SOB this afternoon and it got worse O2 sat down to 82 at home RA 95 now. States her feet are swelling now. FINDINGS: LUNGS AND PLEURA: No focal pulmonary opacity. No pulmonary edema. No pleural effusion. No pneumothorax. Hyperinflation and chronic bronchitic change. HEART AND MEDIASTINUM: No acute abnormality of the cardiac and mediastinal silhouettes. BONES AND SOFT TISSUES: No acute osseous abnormality. IMPRESSION: 1. No acute process. Hyperinflation. Electronically signed by: Norman Gatlin MD 07/22/2023 06:55 PM EDT RP Workstation: [id number]   DG Sacrum/Coccyx Result Date: 07/21/2023 CLINICAL DATA:  Pain after fall EXAM: SACRUM AND COCCYX - 2+ VIEW COMPARISON:  05/11/2023 FINDINGS: Bilateral hip replacements. Patent SI joints. No definite acute displaced fracture is seen. IMPRESSION: No definite acute osseous abnormality. Bilateral hip replacements. Cross-sectional imaging follow-up if high suspicion for fracture. Electronically Signed   By: [name] Bun M.D.   On: 07/21/2023 16:52   CT Cervical Spine Wo Contrast Result Date: 07/21/2023 CLINICAL DATA:  Neck trauma EXAM: CT CERVICAL SPINE WITHOUT CONTRAST TECHNIQUE: Multidetector CT imaging of the cervical spine was performed without intravenous contrast. Multiplanar CT image reconstructions were also generated.  RADIATION DOSE REDUCTION: This exam was performed according to the departmental dose-optimization program which includes automated exposure control, adjustment of the mA and/or kV according to patient size and/or use of iterative reconstruction technique. COMPARISON:  CT 10/16/2021 FINDINGS: Alignment: No subluxation.  Facet alignment is within normal limits. Skull base and vertebrae: No acute fracture. No primary bone lesion or focal pathologic process. Soft tissues and spinal canal: No prevertebral fluid or swelling. No visible canal hematoma. Disc levels: Multilevel degenerative changes. Moderate disc space narrowing C5-C6 and C6-C7. Multilevel hypertrophic facet degenerative changes with foraminal narrowing. Upper chest: Apical emphysema and scarring Other: None IMPRESSION: Degenerative changes without acute osseous abnormality. Electronically Signed   By: [name] Bun M.D.   On: 07/21/2023 16:48   CT Head Wo Contrast Result Date: 07/21/2023 CLINICAL DATA:  Head trauma EXAM: CT HEAD WITHOUT CONTRAST TECHNIQUE: Contiguous axial images were obtained from the base of the skull through the vertex without intravenous contrast. RADIATION DOSE REDUCTION: This exam was performed according to the departmental dose-optimization program which includes automated  exposure control, adjustment of the mA and/or kV according to patient size and/or use of iterative reconstruction technique. COMPARISON:  Head CT 01/15/2022, MRI 01/15/2022 FINDINGS: Brain: No acute territorial infarction, hemorrhage, or intracranial mass. Atrophy and mild chronic small vessel ischemic changes of the white matter. Stable ventricle size. Vascular: No hyperdense vessels.  Carotid vascular calcification. Skull: Normal. Negative for fracture or focal lesion. Sinuses/Orbits: No acute finding. Other: Left posterior scalp hematoma IMPRESSION: 1. No CT evidence for acute intracranial abnormality. 2. Atrophy and mild chronic small vessel ischemic changes  of the white matter. Left posterior scalp hematoma. Electronically Signed   By: [name] Bun M.D.   On: 07/21/2023 16:45   DG Thoracic Spine 2 View Result Date: 07/21/2023 CLINICAL DATA:  Fall with back pain EXAM: THORACIC SPINE 2 VIEWS COMPARISON:  Chest CT 12/14/2022 FINDINGS: Thoracic alignment is within normal limits. Vertebral body heights are maintained. Moderate multilevel degenerative osteophyte of the mid to lower thoracic spine. IMPRESSION: No acute osseous abnormality. Electronically Signed   By: [name] Bun M.D.   On: 07/21/2023 16:41   DG Chest 1 View Result Date: 07/21/2023 CLINICAL DATA:  Pain EXAM: CHEST  1 VIEW COMPARISON:  None Available. FINDINGS: Hyperinflation. No consolidation, pneumothorax or effusion. No edema. Normal cardiopericardial silhouette. Fixation hardware along the left shoulder for arthroplasty at the edge of the imaging field. Degenerative changes of the spine. IMPRESSION: Hyperinflation with chronic changes. No acute cardiopulmonary disease. Electronically Signed   By: Ranell Bring M.D.   On: 07/21/2023 16:39    Microbiology: Results for orders placed or performed during the hospital encounter of 09/26/20  SARS CORONAVIRUS 2 (TAT 6-24 HRS) Nasopharyngeal Nasopharyngeal Swab     Status: None   Collection Time: 09/27/20  1:30 AM   Specimen: Nasopharyngeal Swab  Result Value Ref Range Status   SARS Coronavirus 2 NEGATIVE NEGATIVE Final    Comment: (NOTE) SARS-CoV-2 target nucleic acids are NOT DETECTED.  The SARS-CoV-2 RNA is generally detectable in upper and lower respiratory specimens during the acute phase of infection. Negative results do not preclude SARS-CoV-2 infection, do not rule out co-infections with other pathogens, and should not be used as the sole basis for treatment or other patient management decisions. Negative results must be combined with clinical observations, patient history, and epidemiological information. The expected result is  Negative.  Fact Sheet for Patients: HairSlick.no  Fact Sheet for Healthcare Providers: quierodirigir.com  This test is not yet approved or cleared by the [place]  FDA and  has been authorized for detection and/or diagnosis of SARS-CoV-2 by FDA under an Emergency Use Authorization (EUA). This EUA will remain  in effect (meaning this test can be used) for the duration of the COVID-19 declaration under Se ction 564(b)(1) of the Act, 21 U.S.C. section 360bbb-3(b)(1), unless the authorization is terminated or revoked sooner.  Performed at [facility], [address], [place], [place] [address]     Labs: CBC: Recent Labs  Lab 07/22/23 1813 07/22/23 2334 07/24/23 0637 07/25/23 0604 07/26/23 0542 07/28/23 0617  WBC 10.2  --  14.1* 13.0* 11.1* 10.8*  HGB 11.9* 11.9* 10.8* 10.9* 10.8* 12.0  HCT 36.4 35.0* 33.9* 34.9* 33.6* 37.0  MCV 95.8  --  98.3 98.6 97.4 98.4  PLT 169  --  156 182 180 204   Basic Metabolic Panel: Recent Labs  Lab 07/22/23 1813 07/22/23 2334 07/24/23 0637 07/25/23 0604 07/26/23 0542 07/28/23 0617  NA 135 135 133* 135 132* 137  K  4.3 4.4 4.3 4.2 4.5 4.2  CL 99  --  102 103 101 100  CO2 21*  --  24 22 22 27   GLUCOSE 105*  --  162* 156* 154* 87  BUN 24*  --  27* 29* 40* 41*  CREATININE 1.12*  --  0.99 0.96 0.95 1.07*  CALCIUM  9.1  --  8.8* 9.1 8.9 9.4   Liver Function Tests: No results for input(s): AST, ALT, ALKPHOS, BILITOT, PROT, ALBUMIN in the last 168 hours. CBG: No results for input(s): GLUCAP in the last 168 hours.  Discharge time spent: greater than 30 minutes.  Signed: Zachary JINNY Ba, MD [facility] 07/28/2023

## 2023-07-28 NOTE — Plan of Care (Signed)
  Problem: Clinical Measurements: Goal: Will remain free from infection Outcome: Progressing Goal: Diagnostic test results will improve Outcome: Progressing Goal: Respiratory complications will improve Outcome: Progressing   Problem: Activity: Goal: Risk for activity intolerance will decrease Outcome: Progressing   Problem: Elimination: Goal: Will not experience complications related to bowel motility Outcome: Progressing Goal: Will not experience complications related to urinary retention Outcome: Progressing   Problem: Pain Managment: Goal: General experience of comfort will improve and/or be controlled Outcome: Progressing   Problem: Safety: Goal: Ability to remain free from injury will improve Outcome: Progressing   Problem: Skin Integrity: Goal: Risk for impaired skin integrity will decrease Outcome: Progressing

## 2023-07-29 DIAGNOSIS — K219 Gastro-esophageal reflux disease without esophagitis: Secondary | ICD-10-CM | POA: Diagnosis not present

## 2023-07-29 DIAGNOSIS — I5030 Unspecified diastolic (congestive) heart failure: Secondary | ICD-10-CM | POA: Diagnosis not present

## 2023-07-29 DIAGNOSIS — E782 Mixed hyperlipidemia: Secondary | ICD-10-CM | POA: Diagnosis not present

## 2023-07-29 DIAGNOSIS — J441 Chronic obstructive pulmonary disease with (acute) exacerbation: Secondary | ICD-10-CM | POA: Diagnosis not present

## 2023-07-29 DIAGNOSIS — E039 Hypothyroidism, unspecified: Secondary | ICD-10-CM | POA: Diagnosis not present

## 2023-07-29 DIAGNOSIS — G20B2 Parkinson's disease with dyskinesia, with fluctuations: Secondary | ICD-10-CM | POA: Diagnosis not present

## 2023-07-30 DIAGNOSIS — J441 Chronic obstructive pulmonary disease with (acute) exacerbation: Secondary | ICD-10-CM | POA: Diagnosis not present

## 2023-07-30 DIAGNOSIS — I5030 Unspecified diastolic (congestive) heart failure: Secondary | ICD-10-CM | POA: Diagnosis not present

## 2023-08-02 DIAGNOSIS — J441 Chronic obstructive pulmonary disease with (acute) exacerbation: Secondary | ICD-10-CM | POA: Diagnosis not present

## 2023-08-02 DIAGNOSIS — K5909 Other constipation: Secondary | ICD-10-CM | POA: Diagnosis not present

## 2023-08-02 DIAGNOSIS — G20B2 Parkinson's disease with dyskinesia, with fluctuations: Secondary | ICD-10-CM | POA: Diagnosis not present

## 2023-08-03 ENCOUNTER — Ambulatory Visit (HOSPITAL_BASED_OUTPATIENT_CLINIC_OR_DEPARTMENT_OTHER): Admitting: Primary Care

## 2023-08-03 ENCOUNTER — Other Ambulatory Visit: Payer: Self-pay | Admitting: Neurology

## 2023-08-03 DIAGNOSIS — M25572 Pain in left ankle and joints of left foot: Secondary | ICD-10-CM | POA: Diagnosis not present

## 2023-08-03 DIAGNOSIS — R296 Repeated falls: Secondary | ICD-10-CM

## 2023-08-03 DIAGNOSIS — R269 Unspecified abnormalities of gait and mobility: Secondary | ICD-10-CM

## 2023-08-03 DIAGNOSIS — R2681 Unsteadiness on feet: Secondary | ICD-10-CM

## 2023-08-03 DIAGNOSIS — M6289 Other specified disorders of muscle: Secondary | ICD-10-CM

## 2023-08-04 DIAGNOSIS — G20B2 Parkinson's disease with dyskinesia, with fluctuations: Secondary | ICD-10-CM | POA: Diagnosis not present

## 2023-08-04 DIAGNOSIS — N1831 Chronic kidney disease, stage 3a: Secondary | ICD-10-CM | POA: Diagnosis not present

## 2023-08-09 DIAGNOSIS — M353 Polymyalgia rheumatica: Secondary | ICD-10-CM | POA: Diagnosis not present

## 2023-08-10 DIAGNOSIS — J441 Chronic obstructive pulmonary disease with (acute) exacerbation: Secondary | ICD-10-CM | POA: Diagnosis not present

## 2023-08-11 DIAGNOSIS — J441 Chronic obstructive pulmonary disease with (acute) exacerbation: Secondary | ICD-10-CM | POA: Diagnosis not present

## 2023-08-12 DIAGNOSIS — R2681 Unsteadiness on feet: Secondary | ICD-10-CM | POA: Diagnosis not present

## 2023-08-12 DIAGNOSIS — M6281 Muscle weakness (generalized): Secondary | ICD-10-CM | POA: Diagnosis not present

## 2023-08-16 ENCOUNTER — Encounter (HOSPITAL_COMMUNITY): Payer: Self-pay | Admitting: Emergency Medicine

## 2023-08-16 ENCOUNTER — Emergency Department (HOSPITAL_COMMUNITY)

## 2023-08-16 ENCOUNTER — Observation Stay (HOSPITAL_COMMUNITY)
Admission: EM | Admit: 2023-08-16 | Discharge: 2023-08-19 | Disposition: A | Discharge status: Skilled Nursing Facility | Source: Skilled Nursing Facility | Attending: Emergency Medicine | Admitting: Emergency Medicine

## 2023-08-16 ENCOUNTER — Other Ambulatory Visit: Payer: Self-pay

## 2023-08-16 DIAGNOSIS — M549 Dorsalgia, unspecified: Secondary | ICD-10-CM | POA: Diagnosis not present

## 2023-08-16 DIAGNOSIS — I429 Cardiomyopathy, unspecified: Secondary | ICD-10-CM | POA: Diagnosis not present

## 2023-08-16 DIAGNOSIS — E785 Hyperlipidemia, unspecified: Secondary | ICD-10-CM | POA: Diagnosis not present

## 2023-08-16 DIAGNOSIS — J9611 Chronic respiratory failure with hypoxia: Secondary | ICD-10-CM | POA: Diagnosis not present

## 2023-08-16 DIAGNOSIS — J449 Chronic obstructive pulmonary disease, unspecified: Secondary | ICD-10-CM | POA: Diagnosis not present

## 2023-08-16 DIAGNOSIS — F32A Depression, unspecified: Secondary | ICD-10-CM | POA: Insufficient documentation

## 2023-08-16 DIAGNOSIS — M25551 Pain in right hip: Secondary | ICD-10-CM | POA: Diagnosis not present

## 2023-08-16 DIAGNOSIS — N189 Chronic kidney disease, unspecified: Secondary | ICD-10-CM | POA: Insufficient documentation

## 2023-08-16 DIAGNOSIS — F419 Anxiety disorder, unspecified: Secondary | ICD-10-CM | POA: Diagnosis not present

## 2023-08-16 DIAGNOSIS — I10 Essential (primary) hypertension: Secondary | ICD-10-CM | POA: Diagnosis not present

## 2023-08-16 DIAGNOSIS — I129 Hypertensive chronic kidney disease with stage 1 through stage 4 chronic kidney disease, or unspecified chronic kidney disease: Secondary | ICD-10-CM | POA: Diagnosis not present

## 2023-08-16 DIAGNOSIS — S3210XA Unspecified fracture of sacrum, initial encounter for closed fracture: Secondary | ICD-10-CM | POA: Diagnosis not present

## 2023-08-16 DIAGNOSIS — S329XXA Fracture of unspecified parts of lumbosacral spine and pelvis, initial encounter for closed fracture: Principal | ICD-10-CM | POA: Diagnosis present

## 2023-08-16 DIAGNOSIS — M5136 Other intervertebral disc degeneration, lumbar region with discogenic back pain only: Secondary | ICD-10-CM | POA: Diagnosis not present

## 2023-08-16 DIAGNOSIS — R52 Pain, unspecified: Secondary | ICD-10-CM | POA: Diagnosis present

## 2023-08-16 DIAGNOSIS — X58XXXA Exposure to other specified factors, initial encounter: Secondary | ICD-10-CM | POA: Diagnosis not present

## 2023-08-16 LAB — URINALYSIS, ROUTINE W REFLEX MICROSCOPIC
Bilirubin Urine: NEGATIVE
Glucose, UA: NEGATIVE mg/dL
Hgb urine dipstick: NEGATIVE
Ketones, ur: 5 mg/dL — AB
Nitrite: NEGATIVE
Protein, ur: NEGATIVE mg/dL
Specific Gravity, Urine: 1.029 (ref 1.005–1.030)
pH: 5 (ref 5.0–8.0)

## 2023-08-16 LAB — COMPREHENSIVE METABOLIC PANEL WITH GFR
ALT: 18 U/L (ref 0–44)
AST: 19 U/L (ref 15–41)
Albumin: 3.2 g/dL — ABNORMAL LOW (ref 3.5–5.0)
Alkaline Phosphatase: 111 U/L (ref 38–126)
Anion gap: 14 (ref 5–15)
BUN: 25 mg/dL — ABNORMAL HIGH (ref 8–23)
CO2: 24 mmol/L (ref 22–32)
Calcium: 9.2 mg/dL (ref 8.9–10.3)
Chloride: 100 mmol/L (ref 98–111)
Creatinine, Ser: 1.15 mg/dL — ABNORMAL HIGH (ref 0.44–1.00)
GFR, Estimated: 51 mL/min — ABNORMAL LOW (ref >60)
Glucose, Bld: 113 mg/dL — ABNORMAL HIGH (ref 70–99)
Potassium: 4.3 mmol/L (ref 3.5–5.1)
Sodium: 138 mmol/L (ref 135–145)
Total Bilirubin: 0.6 mg/dL (ref 0.0–1.2)
Total Protein: 6.6 g/dL (ref 6.5–8.1)

## 2023-08-16 LAB — CBC WITH DIFFERENTIAL/PLATELET
Abs Immature Granulocytes: 0.04 K/uL (ref 0.00–0.07)
Basophils Absolute: 0 K/uL (ref 0.0–0.1)
Basophils Relative: 0 %
Eosinophils Absolute: 0.2 K/uL (ref 0.0–0.5)
Eosinophils Relative: 2 %
HCT: 34.6 % — ABNORMAL LOW (ref 36.0–46.0)
Hemoglobin: 10.8 g/dL — ABNORMAL LOW (ref 12.0–15.0)
Immature Granulocytes: 0 %
Lymphocytes Relative: 6 %
Lymphs Abs: 0.5 K/uL — ABNORMAL LOW (ref 0.7–4.0)
MCH: 30.6 pg (ref 26.0–34.0)
MCHC: 31.2 g/dL (ref 30.0–36.0)
MCV: 98 fL (ref 80.0–100.0)
Monocytes Absolute: 0.6 K/uL (ref 0.1–1.0)
Monocytes Relative: 6 %
Neutro Abs: 7.8 K/uL — ABNORMAL HIGH (ref 1.7–7.7)
Neutrophils Relative %: 86 %
Platelets: 183 K/uL (ref 150–400)
RBC: 3.53 MIL/uL — ABNORMAL LOW (ref 3.87–5.11)
RDW: 13.8 % (ref 11.5–15.5)
WBC: 9.1 K/uL (ref 4.0–10.5)
nRBC: 0 % (ref 0.0–0.2)

## 2023-08-16 MED ORDER — DIAZEPAM 5 MG/ML IJ SOLN
2.5000 mg | Freq: Once | INTRAMUSCULAR | Status: AC | PRN
  Administered 2023-08-16 (×2): 2.5 mg via INTRAVENOUS
  Filled 2023-08-16: qty 2

## 2023-08-16 MED ORDER — FENTANYL CITRATE PF 50 MCG/ML IJ SOSY
50.0000 ug | PREFILLED_SYRINGE | Freq: Once | INTRAMUSCULAR | Status: AC
  Administered 2023-08-16 (×2): 50 ug via INTRAVENOUS
  Filled 2023-08-16: qty 1

## 2023-08-16 MED ORDER — BUPROPION HCL ER (XL) 300 MG PO TB24
300.0000 mg | ORAL_TABLET | Freq: Every day | ORAL | Status: DC
  Administered 2023-08-16 – 2023-08-18 (×6): 300 mg via ORAL
  Filled 2023-08-16 (×2): qty 1
  Filled 2023-08-16: qty 2

## 2023-08-16 MED ORDER — LACTATED RINGERS IV BOLUS
500.0000 mL | Freq: Once | INTRAVENOUS | Status: AC
  Administered 2023-08-16 (×2): 500 mL via INTRAVENOUS

## 2023-08-16 MED ORDER — ACETAMINOPHEN 500 MG PO TABS: 1000.0000 mg | ORAL_TABLET | Freq: Every evening | ORAL | Status: DC | PRN

## 2023-08-16 MED ORDER — OXYCODONE HCL 5 MG PO TABS
5.0000 mg | ORAL_TABLET | ORAL | Status: DC | PRN
  Administered 2023-08-16 – 2023-08-17 (×4): 5 mg via ORAL
  Filled 2023-08-16 (×2): qty 1

## 2023-08-16 MED ORDER — CARBIDOPA-LEVODOPA 25-100 MG PO TABS
1.0000 | ORAL_TABLET | Freq: Three times a day (TID) | ORAL | Status: DC
  Administered 2023-08-16 – 2023-08-19 (×18): 1 via ORAL
  Filled 2023-08-16 (×10): qty 1

## 2023-08-16 MED ORDER — GADOBUTROL 1 MMOL/ML IV SOLN
7.5000 mL | Freq: Once | INTRAVENOUS | Status: AC | PRN
  Administered 2023-08-16 (×2): 7.5 mL via INTRAVENOUS

## 2023-08-16 NOTE — Progress Notes (Signed)
 PT/OT consult pending at this time.
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
## 2023-08-16 NOTE — ED Provider Notes (Signed)
  Physical Exam  BP (!) 119/93 (BP Location: Right Arm)   Pulse 82   Temp 97.7 F (36.5 C) (Oral)   Resp 16   Ht 5' 6.5 (1.689 m)   Wt 76 kg   SpO2 99%   BMI 26.64 kg/m   Physical Exam  Procedures  Procedures  ED Course / MDM    Medical Decision Making Amount and/or Complexity of Data Reviewed Labs: ordered. Radiology: ordered.  Risk Prescription drug management.   April April, assumed care for this patient.  In brief this is a 68 year old female here today with continued low back pain. Patient was discharged from rehab, 2 assisted living, has not been able to ambulate in her home due to pain.  Was signed out pending MRI and CT imaging.  CT imaging shows sacral insufficiency fracture, likely the cause of her pain.  Management for this is nonoperative initially.  I believe patient requires rehab placement due to this fracture which is limiting her ability to ambulate, perform any of her ADLs.     April Fairy T, DO 08/16/23 1758

## 2023-08-16 NOTE — ED Triage Notes (Signed)
 Per [facility] pt coming from [facility] independent living c/o lower back pain radiating down into legs x 2 weeks. Patient on 4 L [place] at baseline.

## 2023-08-17 DIAGNOSIS — S329XXA Fracture of unspecified parts of lumbosacral spine and pelvis, initial encounter for closed fracture: Secondary | ICD-10-CM | POA: Diagnosis not present

## 2023-08-17 MED ORDER — METHOCARBAMOL 500 MG PO TABS
500.0000 mg | ORAL_TABLET | Freq: Three times a day (TID) | ORAL | Status: DC
  Administered 2023-08-17 – 2023-08-19 (×11): 500 mg via ORAL
  Filled 2023-08-17 (×7): qty 1

## 2023-08-17 MED ORDER — OXYCODONE HCL 5 MG PO TABS
5.0000 mg | ORAL_TABLET | ORAL | Status: DC | PRN
  Administered 2023-08-17 – 2023-08-19 (×14): 5 mg via ORAL
  Filled 2023-08-17 (×9): qty 1

## 2023-08-17 MED ORDER — BISACODYL 5 MG PO TBEC
5.0000 mg | DELAYED_RELEASE_TABLET | Freq: Every day | ORAL | Status: DC | PRN
  Administered 2023-08-19: 5 mg via ORAL
  Filled 2023-08-17: qty 1

## 2023-08-17 MED ORDER — POLYETHYLENE GLYCOL 3350 17 G PO PACK
17.0000 g | PACK | Freq: Every day | ORAL | Status: DC | PRN
  Administered 2023-08-17 – 2023-08-19 (×3): 17 g via ORAL
  Filled 2023-08-17 (×2): qty 1

## 2023-08-17 MED ORDER — HYDRALAZINE HCL 20 MG/ML IJ SOLN: 10.0000 mg | Freq: Four times a day (QID) | INTRAMUSCULAR | Status: DC | PRN

## 2023-08-17 MED ORDER — METOPROLOL SUCCINATE ER 25 MG PO TB24
25.0000 mg | ORAL_TABLET | Freq: Every day | ORAL | Status: DC
  Administered 2023-08-17 – 2023-08-18 (×4): 25 mg via ORAL
  Filled 2023-08-17 (×2): qty 1

## 2023-08-17 MED ORDER — ENOXAPARIN SODIUM 40 MG/0.4ML IJ SOSY
40.0000 mg | PREFILLED_SYRINGE | INTRAMUSCULAR | Status: DC
  Administered 2023-08-18 – 2023-08-19 (×3): 40 mg via SUBCUTANEOUS
  Filled 2023-08-17 (×2): qty 0.4

## 2023-08-17 MED ORDER — SERTRALINE HCL 50 MG PO TABS
50.0000 mg | ORAL_TABLET | Freq: Every day | ORAL | Status: DC
Start: 2023-08-17 — End: 2023-08-19
  Administered 2023-08-17 – 2023-08-18 (×4): 50 mg via ORAL
  Filled 2023-08-17 (×2): qty 1

## 2023-08-17 MED ORDER — BUDESON-GLYCOPYRROL-FORMOTEROL 160-9-4.8 MCG/ACT IN AERO
2.0000 | INHALATION_SPRAY | Freq: Two times a day (BID) | RESPIRATORY_TRACT | Status: DC
  Administered 2023-08-17 – 2023-08-19 (×7): 2 via RESPIRATORY_TRACT
  Filled 2023-08-17: qty 5.9

## 2023-08-17 MED ORDER — ROSUVASTATIN CALCIUM 10 MG PO TABS
10.0000 mg | ORAL_TABLET | Freq: Every day | ORAL | Status: DC
  Administered 2023-08-17 – 2023-08-18 (×4): 10 mg via ORAL
  Filled 2023-08-17 (×2): qty 1

## 2023-08-17 MED ORDER — IRBESARTAN 75 MG PO TABS
37.5000 mg | ORAL_TABLET | Freq: Every day | ORAL | Status: DC
  Administered 2023-08-17 – 2023-08-18 (×4): 37.5 mg via ORAL
  Filled 2023-08-17 (×2): qty 1

## 2023-08-17 MED ORDER — ALBUTEROL SULFATE (2.5 MG/3ML) 0.083% IN NEBU
2.5000 mg | INHALATION_SOLUTION | Freq: Four times a day (QID) | RESPIRATORY_TRACT | Status: DC | PRN
  Administered 2023-08-17 – 2023-08-18 (×4): 2.5 mg via RESPIRATORY_TRACT
  Filled 2023-08-17 (×2): qty 3

## 2023-08-17 MED ORDER — ACETAMINOPHEN 500 MG PO TABS
1000.0000 mg | ORAL_TABLET | Freq: Three times a day (TID) | ORAL | Status: DC
  Administered 2023-08-17 – 2023-08-19 (×14): 1000 mg via ORAL
  Filled 2023-08-17 (×8): qty 2

## 2023-08-17 MED ORDER — LEVOTHYROXINE SODIUM 88 MCG PO TABS
88.0000 ug | ORAL_TABLET | Freq: Every day | ORAL | Status: DC
  Administered 2023-08-18 – 2023-08-19 (×3): 88 ug via ORAL
  Filled 2023-08-17 (×2): qty 1

## 2023-08-17 MED ORDER — MELATONIN 5 MG PO TABS
5.0000 mg | ORAL_TABLET | Freq: Every day | ORAL | Status: DC
  Administered 2023-08-17 – 2023-08-18 (×4): 5 mg via ORAL
  Filled 2023-08-17 (×2): qty 1

## 2023-08-17 MED ORDER — PANTOPRAZOLE SODIUM 40 MG PO TBEC
40.0000 mg | DELAYED_RELEASE_TABLET | Freq: Every day | ORAL | Status: DC
  Administered 2023-08-18 – 2023-08-19 (×3): 40 mg via ORAL
  Filled 2023-08-17 (×2): qty 1

## 2023-08-17 MED ORDER — SODIUM CHLORIDE 0.9 % IV SOLN: INTRAVENOUS | Status: AC
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
## 2023-08-17 NOTE — H&P (Signed)
 [facility] History and Physical  April [name] FMW:[id number] DOB: 12-Jun-1955 DOA: 08/16/2023 PCP: Tonnie Jon SAILOR, NP  Presented from: ALF Chief Complaint: pain  History of Present Illness: April [name] is a 68 y.o. female with PMH significant for Parkinson's disease, COPD, on home oxygen , nonischemic cardiomyopathy, CKD, polymyalgia rheumatica, anxiety, depression Most recently hospitalized 7/17 -7/23 for COPD exacerbation. Patient reports that she had pelvic pain since a fall few months ago.  At one point, she was in a rehab and since then she has been living in an assisted living facility at [facility]. Last week, patient had a fall again leading to exacerbation of her pain. With worsening pain, patient presented to the ED on 8/11.  In the ED, patient was afebrile, hemodynamically stable Labs with WC count 9.1, hemoglobin 10.8, BUN/creatinine 25/1.5 CT scan of pelvis showed minimally displaced bilateral sacral insufficiency fractures.  MRI lumbar spine showed - No cauda equina compression. - Hyperintense T2-weighted signal and abnormal contrast enhancement within the S2 body of the sacrum, incompletely visualized. This may indicate a sacral insufficiency fracture. If there is any history of malignancy, metastatic disease is also a possibility. -L4-5 small disc bulge and mild facet hypertrophy with narrowing at the lateral recesses, no central spinal canal stenosis or neural impingement.  Hospitalist consultation was requested for pain management and possible knee replacement.  At the time of my evaluation this morning, patient was lying on bed.  History reviewed in detail as above. Patient is concerned that she cannot afford to live alone or live in an assisted living facility. With the degree of pain and the limitation in her mobility, she is expecting to go to a rehab again.  Review of Systems:  All systems were reviewed and were negative unless otherwise  mentioned in the HPI   Past medical history: Past Medical History:  Diagnosis Date   Allergy    Anemia    Anxiety    Anxiety and depression    related to caring for mother during terminal illness   Arthritis    Asthma    AVN (avascular necrosis of bone) (HCC)    hip and wrist   Bronchitis    hx of   Cataract    Chronic kidney disease    COPD (chronic obstructive pulmonary disease) (HCC)    Depression    Emphysema of lung (HCC)    Endometriosis    FH: CAD (coronary artery disease)    GERD (gastroesophageal reflux disease)    ocassional   History of blood transfusion    after hip repackment - broke out in hives and started itching   History of palpitations    Hyperlipidemia    Hypertension    Hypothyroid    Neuromuscular disorder (HCC)    Non-ischemic cardiomyopathy (HCC) 2019   Osteopenia    forteo through Dr. Dolphus (started 10/12)   Osteoporosis    PMR (polymyalgia rheumatica) (HCC)    SIRS (systemic inflammatory response syndrome) (HCC) 10/2017   Smoker    SOB (shortness of breath) on exertion    Squamous cell carcinoma    facial, 2016   Temporal arteritis (HCC)    s/p prednisone  taper    Past surgical history: Past Surgical History:  Procedure Laterality Date   ABDOMINAL ADHESION SURGERY     ABDOMINAL HYSTERECTOMY     BREAST EXCISIONAL BIOPSY Left    BREAST REDUCTION SURGERY Bilateral 06/13/2019   Procedure: MAMMARY REDUCTION  (BREAST);  Surgeon: Elisabeth Craig RAMAN,  MD;  Location: [facility] OR;  Service: Plastics;  Laterality: Bilateral;   BREAST SURGERY Left    benign bx 1990   CARDIAC CATHETERIZATION  2007   no PCI   CATARACT EXTRACTION Bilateral    CHOLECYSTECTOMY     COLONOSCOPY     COLONOSCOPY W/ POLYPECTOMY     INCONTINENCE SURGERY     2007    JOINT REPLACEMENT Left 2012   left hip   REDUCTION MAMMAPLASTY     2021   REVERSE SHOULDER ARTHROPLASTY Left 05/28/2020   Procedure: REVERSE SHOULDER ARTHROPLASTY;  Surgeon: Sharl Selinda Dover, MD;   Location: [facility] OR;  Service: Orthopedics;  Laterality: Left;  2.5 hrs   TONSILLECTOMY     TOTAL HIP ARTHROPLASTY Right 10/04/2012   Procedure: TOTAL HIP ARTHROPLASTY;  Surgeon: Maude LELON Right, MD;  Location: [facility] OR;  Service: Orthopedics;  Laterality: Right;   TOTAL SHOULDER REPLACEMENT Left    WRIST SURGERY Left    2012/ left wrist/ bone removed due to necrosis    Social History:  reports that she quit smoking about 5 years ago. Her smoking use included cigarettes. She started smoking about 39 years ago. She has a 11.2 pack-year smoking history. She has been exposed to tobacco smoke. She has never used smokeless tobacco. She reports current alcohol  use. She reports that she does not use drugs.  Allergies:  Allergies  Allergen Reactions   Sulfa Antibiotics Anaphylaxis, Hives and Dermatitis   Sulfonamide Derivatives Anaphylaxis, Dermatitis, Rash and Other (See Comments)    Reaction occurred when patient was a child. Throat closed, plus a rash.   Alendronate Sodium Other (See Comments)    GI upset   Dilaudid  [Hydromorphone  Hcl] Nausea And Vomiting   Molds & Smuts Itching and Other (See Comments)    Itchy eyes/runny nose   Pollen Extract Itching and Other (See Comments)    Itchy eyes/runny nose   Wound Dressing Adhesive Other (See Comments)    Redness, adhesive tapes. Needs PAPER TAPE.   Sulfa antibiotics, Sulfonamide derivatives, Alendronate sodium, Dilaudid  [hydromorphone  hcl], Molds & smuts, Pollen extract, and Wound dressing adhesive   Family history:  Family History  Problem Relation Age of Onset   Heart disease Mother    Hypertension Mother    Alzheimer's disease Mother    Colon cancer Mother    Kidney failure Mother    Arthritis Brother    Suicidality Brother    Colon polyps Brother    Breast cancer Maternal Aunt    Healthy Son    Esophageal cancer Neg Hx    Stomach cancer Neg Hx    Rectal cancer Neg Hx    Crohn's disease Neg Hx      Physical Exam: Vitals:    08/17/23 0758 08/17/23 0847 08/17/23 1253 08/17/23 1521  BP: (!) 157/94 (!) 161/99  133/78  Pulse: 91 93  95  Resp: (!) 21 16  18   Temp: 98.1 F (36.7 C) 98.2 F (36.8 C)  98.8 F (37.1 C)  TempSrc: Oral Oral  Oral  SpO2: 97% 96% 97% 98%  Weight:      Height:       Wt Readings from Last 3 Encounters:  08/16/23 76 kg  07/23/23 76.4 kg  07/21/23 72.6 kg   Body mass index is 26.64 kg/m.  General exam: Pleasant, elderly Caucasian female. Skin: No rashes, lesions or ulcers. HEENT: Atraumatic, normocephalic, no obvious bleeding Lungs: Clear to auscultation bilaterally,  CVS: S1, S2, no murmur,   GI/Abd:  Soft, nontender, nondistended, bowel sound present,   CNS: Alert, awake, oriented x 3 Psychiatry: Sad affect Extremities: No pedal edema, no calf tenderness,    ----------------------------------------------------------------------------------------------------------------------------------------- ----------------------------------------------------------------------------------------------------------------------------------------- -----------------------------------------------------------------------------------------------------------------------------------------  Assessment/Plan: Intractable pelvic pain Bilateral sacral insufficiency fractures H/o polymyalgia rheumatica Presented with worsening pain after a fall last week. Imaging as above showed minimally displaced bilateral sacral insufficiency fractures Pain regimen --- Scheduled: Tylenol  1 g 3 times daily, Robaxin  500 mg 3 times daily --- PRN: Oxycodone  5 mg every 4 hours PT eval ordered.  Patient is expecting rehab.  Nonischemic cardiomyopathy Hypertension Valsartan  40 mg nightly Substitute with irbesartan   HLD Crestor   CKD Creatinine slightly worse than baseline. IV hydration for next 24 hours. Recent Labs    12/08/22 1039 07/22/23 1813 07/24/23 9362 07/25/23 0604 07/26/23 0542 07/28/23 0617  08/16/23 1057  BUN 21 24* 27* 29* 40* 41* 25*  CREATININE 1.18 1.12* 0.99 0.96 0.95 1.07* 1.15*   COPD On 4 L oxygen  at home Continue bronchodilators  Hypothyroidism Synthroid     Anxiety/depression Bupropion , sertraline  Melatonin nightly       NICHA HEMANN is a 68 y.o. female with PMH significant for Parkinson's disease, COPD, on home oxygen , nonischemic cardiomyopathy, CKD, polymyalgia rheumatica, anxiety, depression Most recently hospitalized 7/17 -7/23 for COPD exacerbation. Patient reports that she had pelvic pain since a fall few months ago.  At one point, she was in a rehab and since then she has been living in an assisted living facility at [facility]. Last week, patient had a fall again leading to exacerbation of her pain. With worsening pain, patient presented to the ED on 8/11.  In the ED, patient was afebrile, hemodynamically stable Labs with WC count 9.1, hemoglobin 10.8, BUN/creatinine 25/1.5 CT scan of pelvis showed minimally displaced bilateral sacral insufficiency fractures.  MRI lumbar spine showed - No cauda equina compression. - Hyperintense T2-weighted signal and abnormal contrast enhancement within the S2 body of the sacrum, incompletely visualized. This may indicate a sacral insufficiency fracture. If there is any history of malignancy, metastatic disease is also a possibility. -L4-5 small disc bulge and mild facet hypertrophy with narrowing at the lateral recesses, no central spinal canal stenosis or neural impingement.  Hospitalist consultation was requested for pain management and possible knee replacement.  At the time of my evaluation this morning, patient was lying on bed.  History reviewed in detail as above. Patient is concerned that she cannot afford to live alone or live in an assisted living facility. With the degree of pain and the limitation in her mobility, she is expecting to go to a rehab again.  Mobility:  PT Orders:  Active   PT Follow up Rec: Skilled Nursing-Short Term Rehab (<3 Hours/Day)08/17/2023 1247   Goals of care:   Code Status: Full Code    DVT prophylaxis:  enoxaparin  (LOVENOX ) injection 40 mg Start: 08/18/23 0800   Antimicrobials: None Fluid: NS at 50 mL/h for 24 hours Consultants: None Family Communication: None at bedside  Status: Observation Level of care:  Med-Surg   Patient is from: ALF Anticipated d/c to: SNF  Diet:  Diet Order             Diet regular Room service appropriate? Yes; Fluid consistency: Thin  Diet effective now                    ------------------------------------------------------------------------------------- Severity of Illness: The appropriate patient status for this patient is OBSERVATION. Observation status is judged to be reasonable and necessary  in order to provide the required intensity of service to ensure the patient's safety. The patient's presenting symptoms, physical exam findings, and initial radiographic and laboratory data in the context of their medical condition is felt to place them at decreased risk for further clinical deterioration. Furthermore, it is anticipated that the patient will be medically stable for discharge from the hospital within 2 midnights of admission.  -------------------------------------------------------------------------------------   Home Meds: Prior to Admission medications   Medication Sig Start Date End Date Taking? Authorizing Provider  acetaminophen  (TYLENOL ) 650 MG CR tablet Take 1,300 mg by mouth at bedtime as needed for pain.   Yes [provider]  albuterol  (VENTOLIN  HFA) 108 (90 Base) MCG/ACT inhaler Inhale 2 puffs into the lungs every 6 (six) hours as needed for wheezing or shortness of breath.   Yes [provider]  azithromycin  (ZITHROMAX ) 250 MG tablet Take 1 tablet (250 mg total) by mouth every Monday, Wednesday, and Friday. 03/03/23 02/02/24 Yes [name], NP   budeson-glycopyrrolate -formoterol  (BREZTRI  AEROSPHERE) 160-9-4.8 MCG/ACT AERO Inhale 2 puffs into the lungs every 12 (twelve) hours. 03/30/23  Yes [name], NP  buPROPion  (WELLBUTRIN  XL) 300 MG 24 hr tablet TAKE 1 TABLET BY MOUTH DAILY Patient taking differently: Take 300 mg by mouth in the morning. 06/07/23  Yes Jodie Lavern CROME, MD  Calcium  500 MG CHEW Chew 500 mg by mouth 2 (two) times daily.   Yes [provider]  carbidopa -levodopa  (SINEMET  IR) 25-100 MG tablet TAKE 1 TABLET BY MOUTH 3 TIMES A DAY Patient taking differently: Take 1 tablet by mouth See admin instructions. Take 1 tablet by mouth at 8 AM, 12 NOON, and 5 PM 08/03/23  Yes Tat, Asberry S, DO  Cholecalciferol  (VITAMIN D -3) 125 MCG (5000 UT) TABS Take 5,000 Units by mouth daily.   Yes [provider]  Cyanocobalamin  (VITAMIN B 12 PO) Take 1 capsule by mouth daily.   Yes [provider]  feeding supplement (ENSURE PLUS HIGH PROTEIN) LIQD Take 237 mLs by mouth 2 (two) times daily between meals. 07/28/23  Yes Shalhoub, Zachary PARAS, MD  furosemide  (LASIX ) 20 MG tablet TAKE 1 TABLET BY MOUTH DAILY AS NEEDED FOR EDEMA Patient taking differently: Take 20 mg by mouth daily as needed for edema. 07/07/23  Yes [name], MD  HYDROcodone -acetaminophen  (NORCO/VICODIN) 5-325 MG tablet Take 1 tablet by mouth every 6 (six) hours as needed for moderate pain (pain score 4-6).   Yes [provider]  levothyroxine  (SYNTHROID ) 88 MCG tablet Take 88 mcg by mouth daily before breakfast.   Yes [provider]  melatonin 5 MG TABS Take 1 tablet (5 mg total) by mouth at bedtime. 07/28/23  Yes Shalhoub, Zachary PARAS, MD  methocarbamol  (ROBAXIN ) 750 MG tablet Take 750 mg by mouth 2 (two) times daily as needed for muscle spasms (if not takingTizanidine).   Yes [provider]  metoprolol  succinate (TOPROL -XL) 25 MG 24 hr tablet Take 1 tablet (25 mg total) by mouth daily. Patient taking differently: Take  25 mg by mouth at bedtime. 03/01/23  Yes [name], MD  omeprazole  (PRILOSEC) 20 MG capsule TAKE 1 CAPSULE BY MOUTH EVERY DAY Patient taking differently: Take 20 mg by mouth at bedtime. 01/20/23  Yes Jodie Lavern CROME, MD  potassium chloride  (KLOR-CON  M) 10 MEQ tablet Take 1 tablet (10 mEq total) by mouth daily as needed (when you take the lasix ). Patient taking differently: Take 10 mEq by mouth See admin instructions. Take 10 mEq by  mouth once a day when taking Furosemide  03/01/23  Yes [name], MD  predniSONE  (DELTASONE ) 10 MG tablet Take 10 mg by mouth daily with breakfast.  08/22/23 Yes [provider]  rosuvastatin  (CRESTOR ) 10 MG tablet Take 1 tablet (10 mg total) by mouth at bedtime. 03/01/23  Yes [name], MD  sertraline  (ZOLOFT ) 50 MG tablet Take 50 mg by mouth at bedtime. 07/08/23  Yes [provider]  SYSTANE ULTRA 0.4-0.3 % SOLN Place 1 drop into both eyes 2 (two) times daily.   Yes [provider]  tiZANidine  (ZANAFLEX ) 4 MG tablet Take 4 mg by mouth every 6 (six) hours as needed for muscle spasms (if not taking Methocarbamol ).   Yes [provider]  valsartan  (DIOVAN ) 40 MG tablet Take 40 mg by mouth at bedtime.   Yes [provider]    Labs on Admission:   CBC: Recent Labs  Lab 08/16/23 1057  WBC 9.1  NEUTROABS 7.8*  HGB 10.8*  HCT 34.6*  MCV 98.0  PLT 183    Basic Metabolic Panel: Recent Labs  Lab 08/16/23 1057  NA 138  K 4.3  CL 100  CO2 24  GLUCOSE 113*  BUN 25*  CREATININE 1.15*  CALCIUM  9.2    Liver Function Tests: Recent Labs  Lab 08/16/23 1057  AST 19  ALT 18  ALKPHOS 111  BILITOT 0.6  PROT 6.6  ALBUMIN 3.2*   No results for input(s): LIPASE, AMYLASE in the last 168 hours. No results for input(s): AMMONIA in the last 168 hours.  Cardiac Enzymes: No results for input(s): CKTOTAL, CKMB, CKMBINDEX, TROPONINI in the last 168 hours.  BNP (last 3  results) Recent Labs    07/24/23 0637  BNP 68.4    ProBNP (last 3 results) Recent Labs    07/22/23 1811  PROBNP 198.0    CBG: No results for input(s): GLUCAP in the last 168 hours.  Lipase     Component Value Date/Time   LIPASE 20 10/27/2017 2049     Urinalysis    Component Value Date/Time   COLORURINE YELLOW 08/16/2023 1803   APPEARANCEUR HAZY (A) 08/16/2023 1803   LABSPEC 1.029 08/16/2023 1803   PHURINE 5.0 08/16/2023 1803   GLUCOSEU NEGATIVE 08/16/2023 1803   HGBUR NEGATIVE 08/16/2023 1803   BILIRUBINUR NEGATIVE 08/16/2023 1803   KETONESUR 5 (A) 08/16/2023 1803   PROTEINUR NEGATIVE 08/16/2023 1803   UROBILINOGEN 1.0 09/29/2012 1247   NITRITE NEGATIVE 08/16/2023 1803   LEUKOCYTESUR MODERATE (A) 08/16/2023 1803     Drugs of Abuse  No results found for: LABOPIA, COCAINSCRNUR, LABBENZ, AMPHETMU, THCU, LABBARB    Radiological Exams on Admission: CT PELVIS WO CONTRAST Result Date: 08/16/2023 EXAM: CT PELVIS, WITHOUT IV CONTRAST 08/16/2023 04:08:31 PM TECHNIQUE: Axial images were acquired through the pelvis without IV contrast. Reformatted images were reviewed. Automated exposure control, iterative reconstruction, and/or weight based adjustment of the mA/kV was utilized to reduce the radiation dose to as low as reasonably achievable. COMPARISON: MRI of earlier today. CLINICAL HISTORY: Sacral fracture. Per triage notes: Per [facility] pt coming from [facility] independent living c/o lower back pain radiating down into legs x 2 weeks. Patient on 4 L [place] at baseline. FINDINGS: BONES: Osteopenia. Minimally displaced fractures in the sagittal plane within both sides of the sacrum, most typical of sacral insufficiency fractures (example image 35 of series 11). Fractures likely extend to the bilateral sacroiliac joint, without displaced components. No sacroiliac joint widening. JOINTS: No dislocation. The joint spaces  are normal. No sacroiliac joint widening. SOFT  TISSUES: Moderate pelvic floor laxity. INTRAPELVIC CONTENTS: Hysterectomy. Contrast within the urinary bladder, presumably from today's MRI. Colonic diverticulosis. Large amount of stool in the cecum. ARTIFACT: Beam hardening artifact from bilateral hip arthroplasty. IMPRESSION: 1. Minimally displaced bilateral sacral insufficiency fractures. Electronically signed by: Rockey Kilts MD 08/16/2023 05:23 PM EDT RP Workstation: [id number]   MR Lumbar Spine W Wo Contrast Result Date: 08/16/2023 EXAM: MRI LUMBAR SPINE 08/16/2023 12:45:13 PM TECHNIQUE: Multiplanar multisequence MRI of the lumbar spine was performed with and without the administration of 7.5 mL of intravenous gadobutrol  (GADAVIST ). COMPARISON: None available. CLINICAL HISTORY: Low back pain, cauda equina syndrome suspected. FINDINGS: BONES AND ALIGNMENT: Fusion across the T12-L1 disc space. Hyperintense T2-weighted signal and abnormal contrast enhancement within the S2 body of the sacrum incompletely visualized. SPINAL CORD: The conus terminates normally. SOFT TISSUES: No paraspinal mass. L1-L2: No significant disc herniation. No spinal canal stenosis or neural foraminal narrowing. L2-L3: No significant disc herniation. No spinal canal stenosis or neural foraminal narrowing. L3-L4: Minimal disc bulge without stenosis. L4-L5: Small disc bulge and mild facet hypertrophy with narrowing at the lateral recesses. No central spinal canal stenosis or neural impingement. L5-S1: No significant disc herniation. No spinal canal stenosis or neural foraminal narrowing. IMPRESSION: 1. No cauda equina compression. 2. Hyperintense T2-weighted signal and abnormal contrast enhancement within the S2 body of the sacrum, incompletely visualized. This may indicate a sacral insufficiency fracture. If there is any history of malignancy, metastatic disease is also a possibility. 3. L4-5 small disc bulge and mild facet hypertrophy with narrowing at the lateral recesses, no central  spinal canal stenosis or neural impingement. Electronically signed by: Franky Stanford MD 08/16/2023 01:56 PM EDT RP Workstation: [id number]     Signed, Chapman Rota, MD [facility] 08/17/2023

## 2023-08-17 NOTE — TOC Initial Note (Signed)
 Transition of Care [facility]) - Initial/Assessment Note    Patient Details  Name: April [name] MRN: [id number] Date of Birth: [birth year]  Transition of Care [facility]) CM/SW Contact:    NORMAN ASPEN, LCSW Phone Number: 08/17/2023, 1:28 PM  Clinical Narrative:                  Met with pt today to begin discussions regarding dc planning needs.  Pt very frustrated with overall situation and likelihood she may need to return to SNF care.  Confirmed with pt that, as of several months ago, she was living in an [place] apt at [facility]).  Was hospitalized in July and recommendation then made for SNF rehab and she was discharged to [facility] on 07[facility]. At the time of SNF discharge it was recommended that she move to ALF level of care which is not provided at [facility] so she moved into ALF apartment at [facility] ...but I was only there a few days before I came here! Pt reports she has one son living in [place] (but is currently in [place]  on vacation) and several friends who help her.  We discussed that it is very likely she may need SNF rehab again with confirmed pelvic fxs.  Pt understands and is agreeable.   PT has been able to complete their evaluation and have recommended SNF for dc.  Will begin work up for this plan.  Expected Discharge Plan: Skilled Nursing Facility Barriers to Discharge: Continued Medical Work up, SNF Pending bed offer, Insurance Authorization   Patient Goals and CMS Choice Patient states their goals for this hospitalization and ongoing recovery are:: pt realistic that SNF rehab will likely be needed again   Choice offered to / list presented to : Patient [place] ownership interest in [facility].provided to:: Patient    Expected Discharge Plan and Services In-house Referral: Clinical Social Work   Post Acute Care Choice: Skilled Nursing Facility Living arrangements for the past 2 months: Assisted Living Facility, Independent Living Facility (see  full note)                 DME Arranged: N/A DME Agency: NA                  Prior Living Arrangements/Services Living arrangements for the past 2 months: Assisted Living Facility, Independent Living Facility (see full note) Lives with:: Facility Resident Patient language and need for interpreter reviewed:: Yes Do you feel safe going back to the place where you live?: Yes      Need for Family Participation in Patient Care: Yes (Comment) Care giver support system in place?: No (comment)   Criminal Activity/Legal Involvement Pertinent to Current Situation/Hospitalization: No - Comment as needed  Activities of Daily Living   ADL Screening (condition at time of admission) Independently performs ADLs?: No Does the patient have a NEW difficulty with bathing/dressing/toileting/self-feeding that is expected to last >3 days?: Yes (Initiates electronic notice to provider for possible OT consult) Does the patient have a NEW difficulty with getting in/out of bed, walking, or climbing stairs that is expected to last >3 days?: Yes (Initiates electronic notice to provider for possible PT consult) Does the patient have a NEW difficulty with communication that is expected to last >3 days?: No Is the patient deaf or have difficulty hearing?: No Does the patient have difficulty seeing, even when wearing glasses/contacts?: No Does the patient have difficulty concentrating, remembering, or making decisions?: No  Permission Sought/Granted  Permission sought to share information with : Facility Medical sales representative, Family Supports Permission granted to share information with : Yes, Verbal Permission Granted  Share Information with NAME: son, Montgomery Beal @ [phone] or friend, [name] Pen @ [phone]           Emotional Assessment Appearance:: Appears stated age Attitude/Demeanor/Rapport: Engaged Affect (typically observed): Frustrated Orientation: : Oriented to Self, Oriented to  Place, Oriented to  Time, Oriented to Situation Alcohol  / Substance Use: Not Applicable Psych Involvement: No (comment)  Admission diagnosis:  Pelvic fracture (HCC) [S32.9XXA] Patient Active Problem List   Diagnosis Date Noted   Pelvic fracture (HCC) 08/17/2023   Acute exacerbation of COPD with asthma (HCC) 07/24/2023   Acute exacerbation of chronic obstructive pulmonary disease (COPD) (HCC) 07/23/2023   Atrophy of skin due to systemic corticosteroid 04/08/2023   Parkinson's disease with dyskinesia and fluctuating manifestations (HCC) 04/08/2023   Bilateral lower extremity edema 05/27/2022   Frequent falls 03/05/2022   Serrated polyp of colon 03/05/2022   Mixed hyperlipidemia 12/03/2021   Unsteady gait 07/20/2021   Chronic diastolic CHF (congestive heart failure) (HCC) 06/13/2021   Upper airway cough syndrome 05/07/2021   Osteoporosis 03/23/2021   Allergic rhinitis 04/06/2019   Aortic atherosclerosis (HCC) 12/17/2017   Pulmonary nodule 12/17/2017   GERD (gastroesophageal reflux disease) 12/12/2017   COPD, severe (HCC) 11/24/2017   CKD (chronic kidney disease), stage III (HCC) 10/28/2017   Vertigo 10/27/2017   Essential hypertension 06/23/2017   Polymyalgia rheumatica (HCC) 05/22/2016   History of bilateral hip replacements 05/22/2016   DDD (degenerative disc disease), lumbar 05/22/2016   History of avascular necrosis of capital femoral epiphysis 08/14/2014   Asthma 03/23/2010   Acquired hypothyroidism 03/21/2010   Chronic recurrent major depressive disorder (HCC) 03/21/2010   Former smoker 03/21/2010   History of temporal arteritis 03/21/2010   PCP:  Tonnie Jon SAILOR, NP Pharmacy:   [facility] [id number] - [place], [place] - [address] [place] [address] Phone: [phone] Fax: [phone]     Social Drivers of Health (SDOH) Social History: SDOH Screenings   Food Insecurity: No Food Insecurity (08/17/2023)  Housing:  Low Risk  (08/17/2023)  Recent Concern: Housing - High Risk (07/23/2023)  Transportation Needs: No Transportation Needs (08/17/2023)  Utilities: Not At Risk (08/17/2023)  Alcohol  Screen: Low Risk  (11/26/2021)  Depression (PHQ2-9): Low Risk  (04/08/2023)  Financial Resource Strain: Medium Risk (12/01/2022)  Physical Activity: Insufficiently Active (12/01/2022)  Social Connections: Socially Isolated (08/17/2023)  Stress: No Stress Concern Present (12/01/2022)  Tobacco Use: Medium Risk (08/16/2023)  Health Literacy: Adequate Health Literacy (12/01/2022)   SDOH Interventions: Housing Interventions: Intervention Not Indicated Social Connections Interventions: Intervention Not Indicated   Readmission Risk Interventions     No data to display

## 2023-08-17 NOTE — Plan of Care (Signed)

## 2023-08-17 NOTE — ED Notes (Signed)
 Inc care, full bed change, gown change, and repositioned to R side.

## 2023-08-17 NOTE — ED Provider Notes (Addendum)
  Physical Exam  BP (!) 154/101   Pulse 90   Temp 97.9 F (36.6 C)   Resp 15   Ht 5' 6.5 (1.689 m)   Wt 76 kg   SpO2 98%   BMI 26.64 kg/m   Physical Exam  Procedures  Procedures  ED Course / MDM     Received care of patient from previous providers. Please see their note for prior hx, physical and care.  Briefly, this is a 68 yo female with ihstory of COPD, Parkinson's disease, nonischemic cardiomyopathy, CKD stage III, admission at the end of July with concern for hypoxia and COPD exacerbation who presented to the emergency department with concern for low back pain and right hip pain.  Had an MRI and CT with finding of bilateral sacral insufficiency fractures. Due to her pain from these fractures she has been awaiting placement to rehab in the emergency department.  I do feel given signs of new fracture with difficult to control pain, admission to the hospital is reasonable.  Discussed with Dr. Christen, and will admit for continued pain control in the setting of new fractures and difficulty ambulating.      Dreama Longs, MD 08/17/23 [id number]    Dreama Longs, MD 08/17/23 [phone]

## 2023-08-17 NOTE — ED Notes (Signed)
 Pt changed, no complications.

## 2023-08-18 DIAGNOSIS — S329XXA Fracture of unspecified parts of lumbosacral spine and pelvis, initial encounter for closed fracture: Secondary | ICD-10-CM | POA: Diagnosis not present

## 2023-08-18 LAB — BASIC METABOLIC PANEL WITH GFR
Anion gap: 7 (ref 5–15)
BUN: 21 mg/dL (ref 8–23)
CO2: 27 mmol/L (ref 22–32)
Calcium: 8.7 mg/dL — ABNORMAL LOW (ref 8.9–10.3)
Chloride: 101 mmol/L (ref 98–111)
Creatinine, Ser: 1.22 mg/dL — ABNORMAL HIGH (ref 0.44–1.00)
GFR, Estimated: 48 mL/min — ABNORMAL LOW (ref >60)
Glucose, Bld: 120 mg/dL — ABNORMAL HIGH (ref 70–99)
Potassium: 4.1 mmol/L (ref 3.5–5.1)
Sodium: 135 mmol/L (ref 135–145)

## 2023-08-18 LAB — CBC
HCT: 33.8 % — ABNORMAL LOW (ref 36.0–46.0)
Hemoglobin: 10.5 g/dL — ABNORMAL LOW (ref 12.0–15.0)
MCH: 31.2 pg (ref 26.0–34.0)
MCHC: 31.1 g/dL (ref 30.0–36.0)
MCV: 100.3 fL — ABNORMAL HIGH (ref 80.0–100.0)
Platelets: 189 K/uL (ref 150–400)
RBC: 3.37 MIL/uL — ABNORMAL LOW (ref 3.87–5.11)
RDW: 13.8 % (ref 11.5–15.5)
WBC: 5.1 K/uL (ref 4.0–10.5)
nRBC: 0 % (ref 0.0–0.2)

## 2023-08-18 MED ORDER — ORAL CARE MOUTH RINSE: 15.0000 mL | OROMUCOSAL | Status: DC | PRN

## 2023-08-18 MED ORDER — MORPHINE SULFATE (PF) 2 MG/ML IV SOLN
1.0000 mg | Freq: Once | INTRAVENOUS | Status: AC
  Administered 2023-08-18 (×2): 1 mg via INTRAVENOUS
  Filled 2023-08-18: qty 1

## 2023-08-18 NOTE — Progress Notes (Signed)
 Physical Therapy Treatment Patient Details Name: April [name] MRN: [id number] DOB: 08-Feb-1955 Today's Date: 08/18/2023   History of Present Illness April [name] is a 68 y.o. F admitted 08/16/23 with inability to ambulate, back and right  leg pain.  MRI and CT with finding of bilateral sacral insufficiency fractures. PMH includes recently in [facility] for respiratory /hypoxia.COPD on room air, Parkinson's disease, CKD, nonischemic cardiomyopathy with preserved EF, bil TSA, Bil THA, L TKA,    PT Comments  The patient was premedicated for pain. The patient is motivated to  mobilize. Patient did tolerate sitting then standing at RW briefly, reporting increased pain , especially right hip area. Patient will benefit from continued inpatient follow up therapy, <3 hours/day     If plan is discharge home, recommend the following: Two people to help with walking and/or transfers;Two people to help with bathing/dressing/bathroom;Assistance with cooking/housework;Assist for transportation;Help with stairs or ramp for entrance   Can travel by private vehicle     No  Equipment Recommendations  None recommended by PT    Recommendations for Other Services       Precautions / Restrictions Precautions Precautions: Fall Recall of Precautions/Restrictions: Intact Precaution/Restrictions Comments: watch SpO2, on 2l at DC 7/23 to SNF     Mobility  Bed Mobility   Bed Mobility: Rolling, Sit to Supine, Supine to Sit Rolling: +2 for safety/equipment, Used rails, Mod assist   Supine to sit: Max assist, +2 for safety/equipment, HOB elevated, +2 for physical assistance Sit to supine: Mod assist, HOB elevated   General bed mobility comments: max use of rails to roll but did perform. Max assist to move to sitting, HOB raised, Assist legs back onto bed to return to supine    Transfers Overall transfer level: Needs assistance Equipment used: Rolling walker (2 wheels) Transfers: Sit to/from Stand Sit to  Stand: Mod assist, From elevated surface, +2 physical assistance           General transfer comment: able to stand at [facility] for ~ 1 minute, unable to take a step    Ambulation/Gait                   Stairs             Wheelchair Mobility     Tilt Bed    Modified Rankin (Stroke Patients Only)       Balance Overall balance assessment: Needs assistance Sitting-balance support: Feet supported, Bilateral upper extremity supported Sitting balance-Leahy Scale: Poor Sitting balance - Comments: Initially poor, however improved to fair. Pt was heavily reliant on her BUE placed on the bed, in order to maintain sitting                                    Communication Communication Communication: No apparent difficulties  Cognition Arousal: Alert Behavior During Therapy: WFL for tasks assessed/performed, Anxious   PT - Cognitive impairments: No apparent impairments                       PT - Cognition Comments: pleasant and motivated Following commands: Intact      Cueing Cueing Techniques: Verbal cues  Exercises      General Comments        Pertinent Vitals/Pain Pain Assessment Faces Pain Scale: Hurts worst Pain Location: right hip, accross buttocks, at rest 7, mobilizing 10 Pain Descriptors / Indicators: Discomfort, Sore,  Grimacing, Guarding, Jabbing, Moaning Pain Intervention(s): Limited activity within patient's tolerance, Monitored during session, Premedicated before session, Repositioned    Home Living Family/patient expects to be discharged to:: Assisted living Living Arrangements: Alone     Home Access: Elevator         Home Equipment: Shower seat - built in;Grab bars - tub/shower;Grab bars - toilet;Hand held Stage manager (4 wheels);None      Prior Function            PT Goals (current goals can now be found in the care plan section) Progress towards PT goals: Progressing toward goals     Frequency    Min 2X/week      PT Plan      Co-evaluation PT/OT/SLP Co-Evaluation/Treatment: Yes Reason for Co-Treatment: For patient/therapist safety;To address functional/ADL transfers PT goals addressed during session: Mobility/safety with mobility;Balance;Proper use of DME;Strengthening/ROM OT goals addressed during session: ADL's and self-care;Proper use of Adaptive equipment and DME;Strengthening/ROM      AM-PAC PT 6 Clicks Mobility   Outcome Measure  Help needed turning from your back to your side while in a flat bed without using bedrails?: A Lot Help needed moving from lying on your back to sitting on the side of a flat bed without using bedrails?: A Lot Help needed moving to and from a bed to a chair (including a wheelchair)?: Total Help needed standing up from a chair using your arms (e.g., wheelchair or bedside chair)?: Total Help needed to walk in hospital room?: Total Help needed climbing 3-5 steps with a railing? : Total 6 Click Score: 8    End of Session Equipment Utilized During Treatment: Gait belt Activity Tolerance: Patient limited by pain Patient left: in bed;with call bell/phone within reach;with bed alarm set Nurse Communication: Mobility status PT Visit Diagnosis: Difficulty in walking, not elsewhere classified (R26.2);History of falling (Z91.81);Pain;Other symptoms and signs involving the nervous system (R29.898) Pain - Right/Left: Right Pain - part of body: Hip     Time: 1015-1040 PT Time Calculation (min) (ACUTE ONLY): 25 min  Charges:    $Therapeutic Activity: 8-22 mins PT General Charges $$ ACUTE PT VISIT: 1 Visit                     Darice Potters PT Acute Rehabilitation Services Office [phone]    Potters Darice Norris 08/18/2023, 1:27 PM

## 2023-08-18 NOTE — Care Management Obs Status (Signed)
 MEDICARE OBSERVATION STATUS NOTIFICATION   Patient Details  Name: April [name] MRN: [id number] Date of Birth: May 01, 1955   Medicare Observation Status Notification Given:  Yes    NORMAN ASPEN, LCSW 08/18/2023, 11:19 AM

## 2023-08-18 NOTE — Evaluation (Signed)
 Occupational Therapy Evaluation Patient Details Name: April [name] MRN: [id number] DOB: 1955-12-25 Today's Date: 08/18/2023   History of Present Illness   April [name] is a 68 yr old female admitted 08/16/23 with inability to ambulate, back and right  leg pain.  MRI and CT with finding of bilateral sacral insufficiency fractures. PMH includes [facility] in [facility] for [facility] /[facility], COPD, Parkinson's disease, CKD, nonischemic cardiomyopathy with preserved EF, bilateral TSA, bilateral THA, L TKA,     Clinical Impressions The pt is currently presenting significantly below her baseline level of functioning for self-care management, as she is limited by the below listed deficits (see OT problem list). During the session, she required max assist for simulated lower body dressing in bed, mod-max assist for bed mobility, and mod assist x2 to stand using a RW. She required increased time and effort for activity, due to R hip and buttocks pain, with associated guarding of movements. She presented with limited standing tolerance, due to pain, subsequently needing to sit back down shortly after standing. As such, she was unable to progress to performing further out of bed activity. She will benefit from further OT services to maximize her independence with self-care tasks and to decrease the risk for restricted participation in meaningful activities. Patient will benefit from continued inpatient follow up therapy, <3 hours/day.      If plan is discharge home, recommend the following:   A lot of help with walking and/or transfers;A lot of help with bathing/dressing/bathroom;Assistance with cooking/housework;Assist for transportation;Help with stairs or ramp for entrance     Functional Status Assessment   Patient has had a recent decline in their functional status and demonstrates the ability to make significant improvements in function in a reasonable and predictable amount of time.     Equipment  Recommendations   Other (comment) (to be determined, pending progress at next level of care)     Recommendations for Other Services         Precautions/Restrictions   Precautions Precautions: Fall Restrictions Weight Bearing Restrictions Per Provider Order: No     Mobility Bed Mobility Overal bed mobility: Needs Assistance Bed Mobility: Rolling, Sidelying to Sit   Sidelying to sit: Max assist, +2 for physical assistance, HOB elevated, Used rails   Sit to supine: Mod assist (required assist for BLE management)        Transfers Overall transfer level: Needs assistance Equipment used: Rolling walker (2 wheels) Transfers: Sit to/from Stand Sit to Stand: Mod assist, From elevated surface, +2 physical assistance                  Balance       Sitting balance - Comments: Initially poor, however improved to fair. Pt was heavily reliant on her BUE placed on the bed, in order to maintain sitting     Standing balance-Leahy Scale: Poor         ADL either performed or assessed with clinical judgement   ADL Overall ADL's : Needs assistance/impaired Eating/Feeding: Independent;Bed level   Grooming: Set up;Bed level           Upper Body Dressing : Set up;Bed level   Lower Body Dressing: Maximal assistance;Bed level       Toileting- Clothing Manipulation and Hygiene: Maximal assistance;Bed level Toileting - Clothing Manipulation Details (indicate cue type and reason): based on clinical judgement             Vision Baseline Vision/History: 1 Wears glasses Additional Comments: She correctly read  the time depicted on the wall clock.            Pertinent Vitals/Pain Pain Assessment Pain Assessment: 0-10 Pain Score: 8  Pain Location:  (R hip, buttocks) Pain Intervention(s): Limited activity within patient's tolerance, Monitored during session, Repositioned, Premedicated before session     Extremity/Trunk Assessment Upper Extremity  Assessment Upper Extremity Assessment: Overall WFL for tasks assessed;Left hand dominant   Lower Extremity Assessment RLE: Unable to fully assess due to pain LLE: Unable to fully assess due to pain       Communication Communication Communication: No apparent difficulties   Cognition Arousal: Alert Behavior During Therapy: WFL for tasks assessed/performed, Anxious Cognition: No apparent impairments        Following commands: Intact       Cueing  General Comments   Cueing Techniques: Verbal cues              Home Living Family/patient expects to be discharged to:: Assisted living Living Arrangements: Alone     Home Access: Elevator           Bathroom Shower/Tub: Walk-in shower         Home Equipment: Shower seat - built in;Grab bars - tub/shower;Grab bars - toilet;Hand held Stage manager (4 wheels);None          Prior Functioning/Environment Prior Level of Function : Independent/Modified Independent             Mobility Comments:  (Prior to initial fall a couple weeks ago, she used a rollator for ambulation. She has been limited by pain over the past couple weeks and has been using a wheelchair.) ADLs Comments:  (Independent with ADLs and the facility managed meals and cleaning.)    OT Problem List: Decreased strength;Decreased activity tolerance;Impaired balance (sitting and/or standing);Decreased knowledge of use of DME or AE;Pain   OT Treatment/Interventions: Self-care/ADL training;Neuromuscular education;Energy conservation;DME and/or AE instruction;Therapeutic activities;Patient/family education;Balance training;Therapeutic exercise      OT Goals(Current goals can be found in the care plan section)   Acute Rehab OT Goals OT Goal Formulation: With patient Time For Goal Achievement: 09/01/23 Potential to Achieve Goals: Good ADL Goals Pt Will Perform Grooming: with set-up;sitting Pt Will Perform Lower Body Dressing: with min  assist;sitting/lateral leans;with adaptive equipment Pt Will Transfer to Toilet: with min assist;stand pivot transfer;bedside commode Additional ADL Goal #1: The pt will perform bed mobility with min assist, in prep for progressive ADL participation.   OT Frequency:  Min 2X/week    Co-evaluation PT/OT/SLP Co-Evaluation/Treatment: Yes Reason for Co-Treatment: For patient/therapist safety;To address functional/ADL transfers PT goals addressed during session: Mobility/safety with mobility;Balance;Proper use of DME;Strengthening/ROM OT goals addressed during session: ADL's and self-care;Proper use of Adaptive equipment and DME;Strengthening/ROM      AM-PAC OT 6 Clicks Daily Activity     Outcome Measure Help from another person eating meals?: None Help from another person taking care of personal grooming?: A Little Help from another person toileting, which includes using toliet, bedpan, or urinal?: A Lot Help from another person bathing (including washing, rinsing, drying)?: A Lot Help from another person to put on and taking off regular upper body clothing?: A Little Help from another person to put on and taking off regular lower body clothing?: A Lot 6 Click Score: 16   End of Session Equipment Utilized During Treatment: Gait belt;Rolling walker (2 wheels) Nurse Communication: Mobility status  Activity Tolerance: Patient limited by pain Patient left: in bed;with call bell/phone within reach;with bed alarm set  OT Visit Diagnosis: Unsteadiness on feet (R26.81);Other abnormalities of gait and mobility (R26.89);History of falling (Z91.81);Muscle weakness (generalized) (M62.81);Pain Pain - part of body:  (R hip and buttocks)                Time: 8977-8953 OT Time Calculation (min): 24 min Charges:  OT General Charges $OT Visit: 1 Visit OT Evaluation $OT Eval Moderate Complexity: 1 Mod    Jovi Alvizo L Irini Leet, OTR/L 08/18/2023, 1:27 PM

## 2023-08-18 NOTE — TOC Progression Note (Signed)
 Transition of Care [facility]) - Progression Note    Patient Details  Name: April [name] MRN: [id number] Date of Birth: Mar 26, 1955  [facility]) CM/SW Contact  NORMAN ASPEN, LCSW Phone Number: 08/18/2023, 3:53 PM  Clinical Narrative:     Have reviewed SNF bed offers with pt today and she has accepted bed at [facility].  Have begun insurance authorization.  Expected Discharge Plan: Skilled Nursing Facility Barriers to Discharge: Continued Medical Work up, SNF Pending bed offer, English as a second language teacher               Expected Discharge Plan and Services In-house Referral: Clinical Social Work   Post Acute Care Choice: Skilled Nursing Facility Living arrangements for the past 2 months: Assisted Living Facility, Independent Living Facility (see full note)                 DME Arranged: N/A DME Agency: NA                   Social Drivers of Health (SDOH) Interventions SDOH Screenings   Food Insecurity: No Food Insecurity (08/17/2023)  Housing: Low Risk  (08/17/2023)  Recent Concern: Housing - High Risk (07/23/2023)  Transportation Needs: No Transportation Needs (08/17/2023)  Utilities: Not At Risk (08/17/2023)  Alcohol  Screen: Low Risk  (11/26/2021)  Depression (PHQ2-9): Low Risk  (04/08/2023)  Financial Resource Strain: Medium Risk (12/01/2022)  Physical Activity: Insufficiently Active (12/01/2022)  Social Connections: Socially Isolated (08/17/2023)  Stress: No Stress Concern Present (12/01/2022)  Tobacco Use: Medium Risk (08/16/2023)  Health Literacy: Adequate Health Literacy (12/01/2022)    Readmission Risk Interventions     No data to display

## 2023-08-18 NOTE — Progress Notes (Signed)
 PROGRESS NOTE    April [name]  FMW:[id number] DOB: [birth year] DOA: 08/16/2023 PCP: Tonnie Jon SAILOR, NP   Brief Narrative:   68 years old female with past medical history of multiple comorbidities including Parkinson's disease, COPD, chronic hypoxic respiratory failure, nonischemic cardiomyopathy, PMR, anxiety, major and was diagnosed with bilateral sacral insufficiency fractures, pending skilled nursing facility placement.  Assessment & Plan:  Principal Problem:   Pelvic fracture (HCC)   Intractable pelvic pain secondary to bilateral sacral insufficiency fractures, POA: Bilateral sacral insufficiency fractures H/o polymyalgia rheumatica Presented with worsening pain after a fall last week. Imaging as above showed minimally displaced bilateral sacral insufficiency fractures Pain regimen --- Scheduled: Tylenol  1 g 3 times daily, Robaxin  500 mg 3 times daily --- PRN: Oxycodone  5 mg every 4 hours PT eval ordered.  Patient is expecting rehab. No indication for surgical management   Nonischemic cardiomyopathy Essential hypertension Valsartan  40 mg nightly Substitute with irbesartan    Hyperlipidemia: Continue Crestor    Acute: Likely prerenal, continue with intravenous fluids and follow BMP in the morning.    COPD, POA: Not in exacerbation Chronic hypoxic respiratory failure,POA:  On 4 L oxygen  at home Continue bronchodilators   Hypothyroidism Synthroid      Anxiety/major moderate depression Continue with bupropion  and sertraline  Melatonin nightly  Disposition: Patient is from [facility] assisted living facility.  She needs skilled nursing facility/rehab placement.  Case management and PT/OT on board.   DVT prophylaxis: enoxaparin  (LOVENOX ) injection 40 mg Start: 08/18/23 0800     Code Status: Full Code Family Communication:   Status is: Observation The patient remains OBS appropriate and will d/c before 2 midnights.    Subjective:  She said that she  was at [facility] assisted living facility for a few days.  Before that, she was at a skilled nursing facility.  She was complaining of pelvic pain this morning so nurse placed pillows underneath her hips.  She is currently on NS at 50 cc/h.  She said that she hit her tailbone as well as her head when she fell at the assisted living facility.  She is agreeable to go to skilled nursing facility on discharge.  Examination:  General exam: Appears calm and comfortable  Respiratory system: Clear to auscultation. Respiratory effort normal. Cardiovascular system: S1 & S2 heard, RRR. No JVD, murmurs, rubs, gallops or clicks. No pedal edema. Gastrointestinal system: Abdomen is nondistended, soft and nontender. No organomegaly or masses felt. Normal bowel sounds heard. Central nervous system: Alert and oriented. No focal neurological deficits. Extremities: Immediate mobility of the lower extremity secondary to bilateral sacral insufficiency fractures and pain Skin: No rashes, lesions or ulcers Psychiatry: Judgement and insight appear normal. Mood & affect appropriate.       Diet Orders (From admission, onward)     Start     Ordered   08/17/23 0758  Diet regular Room service appropriate? Yes; Fluid consistency: Thin  Diet effective now       Question Answer Comment  Room service appropriate? Yes   Fluid consistency: Thin      08/17/23 0757            Objective: Vitals:   08/17/23 1521 08/17/23 2204 08/18/23 0201 08/18/23 0607  BP: 133/78 128/74 (!) 150/95 (!) 136/90  Pulse: 95 94 87 78  Resp: 18 19 16 18   Temp: 98.8 F (37.1 C) 98.4 F (36.9 C) 97.8 F (36.6 C) 97.8 F (36.6 C)  TempSrc: Oral Oral Oral Oral  SpO2: 98%  99% 99% 96%  Weight:      Height:        Intake/Output Summary (Last 24 hours) at 08/18/2023 0857 Last data filed at 08/18/2023 9162 Gross per 24 hour  Intake 1454.92 ml  Output 750 ml  Net 704.92 ml   Filed Weights   08/16/23 1023  Weight: 76 kg     Scheduled Meds:  acetaminophen   1,000 mg Oral TID   budesonide -glycopyrrolate -formoterol   2 puff Inhalation Q12H   buPROPion   300 mg Oral QHS   carbidopa -levodopa   1 tablet Oral TID   enoxaparin  (LOVENOX ) injection  40 mg Subcutaneous Q24H   irbesartan   37.5 mg Oral QHS   levothyroxine   88 mcg Oral QAC breakfast   melatonin  5 mg Oral QHS   methocarbamol   500 mg Oral TID   metoprolol  succinate  25 mg Oral QHS   pantoprazole   40 mg Oral QAC breakfast   rosuvastatin   10 mg Oral QHS   sertraline   50 mg Oral QHS   Continuous Infusions:  sodium chloride  50 mL/hr at 08/17/23 8161    Nutritional status     Body mass index is 26.64 kg/m.  Data Reviewed:   CBC: Recent Labs  Lab 08/16/23 1057 08/18/23 0334  WBC 9.1 5.1  NEUTROABS 7.8*  --   HGB 10.8* 10.5*  HCT 34.6* 33.8*  MCV 98.0 100.3*  PLT 183 189   Basic Metabolic Panel: Recent Labs  Lab 08/16/23 1057 08/18/23 0334  NA 138 135  K 4.3 4.1  CL 100 101  CO2 24 27  GLUCOSE 113* 120*  BUN 25* 21  CREATININE 1.15* 1.22*  CALCIUM  9.2 8.7*   GFR: Estimated Creatinine Clearance: 45.2 mL/min (A) (by C-G formula based on SCr of 1.22 mg/dL (H)). Liver Function Tests: Recent Labs  Lab 08/16/23 1057  AST 19  ALT 18  ALKPHOS 111  BILITOT 0.6  PROT 6.6  ALBUMIN 3.2*   No results for input(s): LIPASE, AMYLASE in the last 168 hours. No results for input(s): AMMONIA in the last 168 hours. Coagulation Profile: No results for input(s): INR, PROTIME in the last 168 hours. Cardiac Enzymes: No results for input(s): CKTOTAL, CKMB, CKMBINDEX, TROPONINI in the last 168 hours. BNP (last 3 results) Recent Labs    07/22/23 1811  PROBNP 198.0   HbA1C: No results for input(s): HGBA1C in the last 72 hours. CBG: No results for input(s): GLUCAP in the last 168 hours. Lipid Profile: No results for input(s): CHOL, HDL, LDLCALC, TRIG, CHOLHDL, LDLDIRECT in the last 72 hours. Thyroid   Function Tests: No results for input(s): TSH, T4TOTAL, FREET4, T3FREE, THYROIDAB in the last 72 hours. Anemia Panel: No results for input(s): VITAMINB12, FOLATE, FERRITIN, TIBC, IRON, RETICCTPCT in the last 72 hours. Sepsis Labs: No results for input(s): PROCALCITON, LATICACIDVEN in the last 168 hours.  No results found for this or any previous visit (from the past 240 hours).       Radiology Studies: CT PELVIS WO CONTRAST Result Date: 08/16/2023 EXAM: CT PELVIS, WITHOUT IV CONTRAST 08/16/2023 04:08:31 PM TECHNIQUE: Axial images were acquired through the pelvis without IV contrast. Reformatted images were reviewed. Automated exposure control, iterative reconstruction, and/or weight based adjustment of the mA/kV was utilized to reduce the radiation dose to as low as reasonably achievable. COMPARISON: MRI of earlier today. CLINICAL HISTORY: Sacral fracture. Per triage notes: Per [facility] pt coming from [facility] independent living c/o lower back pain radiating down into legs x 2 weeks. Patient on 4  L  at baseline. FINDINGS: BONES: Osteopenia. Minimally displaced fractures in the sagittal plane within both sides of the sacrum, most typical of sacral insufficiency fractures (example image 35 of series 11). Fractures likely extend to the bilateral sacroiliac joint, without displaced components. No sacroiliac joint widening. JOINTS: No dislocation. The joint spaces are normal. No sacroiliac joint widening. SOFT TISSUES: Moderate pelvic floor laxity. INTRAPELVIC CONTENTS: Hysterectomy. Contrast within the urinary bladder, presumably from today's MRI. Colonic diverticulosis. Large amount of stool in the cecum. ARTIFACT: Beam hardening artifact from bilateral hip arthroplasty. IMPRESSION: 1. Minimally displaced bilateral sacral insufficiency fractures. Electronically signed by: Rockey Kilts MD 08/16/2023 05:23 PM EDT RP Workstation: [id number]   MR Lumbar Spine W Wo  Contrast Result Date: 08/16/2023 EXAM: MRI LUMBAR SPINE 08/16/2023 12:45:13 PM TECHNIQUE: Multiplanar multisequence MRI of the lumbar spine was performed with and without the administration of 7.5 mL of intravenous gadobutrol  (GADAVIST ). COMPARISON: None available. CLINICAL HISTORY: Low back pain, cauda equina syndrome suspected. FINDINGS: BONES AND ALIGNMENT: Fusion across the T12-L1 disc space. Hyperintense T2-weighted signal and abnormal contrast enhancement within the S2 body of the sacrum incompletely visualized. SPINAL CORD: The conus terminates normally. SOFT TISSUES: No paraspinal mass. L1-L2: No significant disc herniation. No spinal canal stenosis or neural foraminal narrowing. L2-L3: No significant disc herniation. No spinal canal stenosis or neural foraminal narrowing. L3-L4: Minimal disc bulge without stenosis. L4-L5: Small disc bulge and mild facet hypertrophy with narrowing at the lateral recesses. No central spinal canal stenosis or neural impingement. L5-S1: No significant disc herniation. No spinal canal stenosis or neural foraminal narrowing. IMPRESSION: 1. No cauda equina compression. 2. Hyperintense T2-weighted signal and abnormal contrast enhancement within the S2 body of the sacrum, incompletely visualized. This may indicate a sacral insufficiency fracture. If there is any history of malignancy, metastatic disease is also a possibility. 3. L4-5 small disc bulge and mild facet hypertrophy with narrowing at the lateral recesses, no central spinal canal stenosis or neural impingement. Electronically signed by: Franky Stanford MD 08/16/2023 01:56 PM EDT RP Workstation: [id number]        LOS: 0 days   Time spent= 39 mins    Deliliah Room, MD [facility]  If 7PM-7AM, please contact night-coverage  08/18/2023, 8:57 AM

## 2023-08-18 NOTE — NC FL2 (Signed)
 [place]  MEDICAID FL2 LEVEL OF CARE FORM     IDENTIFICATION  Patient Name: April [name] Birthdate: 1955/03/24 Sex: female Admission Date (Current Location): 08/16/2023  [facility] and [place] Number:  Producer, television/film/video and Address:  [facility],  [address] [place], [place] [address]      Provider Number: [id number]  Attending Physician Name and Address:  Dino Antu, MD  Relative Name and Phone Number:  Montgomery Beal @ [phone]    Current Level of Care: Hospital Recommended Level of Care: Skilled Nursing Facility Prior Approval Number:    Date Approved/Denied:   PASRR Number: [id number]  Discharge Plan: SNF    Current Diagnoses: Patient Active Problem List   Diagnosis Date Noted   Pelvic fracture (HCC) 08/17/2023   Acute exacerbation of COPD with asthma (HCC) 07/24/2023   Acute exacerbation of chronic obstructive pulmonary disease (COPD) (HCC) 07/23/2023   Atrophy of skin due to systemic corticosteroid 04/08/2023   Parkinson's disease with dyskinesia and fluctuating manifestations (HCC) 04/08/2023   Bilateral lower extremity edema 05/27/2022   Frequent falls 03/05/2022   Serrated polyp of colon 03/05/2022   Mixed hyperlipidemia 12/03/2021   Unsteady gait 07/20/2021   Chronic diastolic CHF (congestive heart failure) (HCC) 06/13/2021   Upper airway cough syndrome 05/07/2021   Osteoporosis 03/23/2021   Allergic rhinitis 04/06/2019   Aortic atherosclerosis (HCC) 12/17/2017   Pulmonary nodule 12/17/2017   GERD (gastroesophageal reflux disease) 12/12/2017   COPD, severe (HCC) 11/24/2017   CKD (chronic kidney disease), stage III (HCC) 10/28/2017   Vertigo 10/27/2017   Essential hypertension 06/23/2017   Polymyalgia rheumatica (HCC) 05/22/2016   History of bilateral hip replacements 05/22/2016   DDD (degenerative disc disease), lumbar 05/22/2016   History of avascular necrosis of capital femoral epiphysis 08/14/2014   Asthma 03/23/2010    Acquired hypothyroidism 03/21/2010   Chronic recurrent major depressive disorder (HCC) 03/21/2010   Former smoker 03/21/2010   History of temporal arteritis 03/21/2010    Orientation RESPIRATION BLADDER Height & Weight     Self, Time, Situation, Place  O2 Continent Weight: 167 lb 8.8 oz (76 kg) Height:  5' 6.5 (168.9 cm)  BEHAVIORAL SYMPTOMS/MOOD NEUROLOGICAL BOWEL NUTRITION STATUS      Continent Diet (heart healthy)  AMBULATORY STATUS COMMUNICATION OF NEEDS Skin   Extensive Assist Verbally Normal                       Personal Care Assistance Level of Assistance  Bathing, Dressing Bathing Assistance: Limited assistance Feeding assistance: Limited assistance Dressing Assistance: Limited assistance     Functional Limitations Info  Sight, Hearing, Speech Sight Info: Adequate Hearing Info: Adequate Speech Info: Adequate    SPECIAL CARE FACTORS FREQUENCY  PT (By licensed PT), OT (By licensed OT)     PT Frequency: 5x/wk OT Frequency: 5x/wk            Contractures Contractures Info: Not present    Additional Factors Info  Code Status, Allergies, Psychotropic Code Status Info: Full Allergies Info: Sulfa Antibiotics, Sulfonamide Derivatives, Alendronate Sodium, Dilaudid  (Hydromorphone  Hcl), Molds & Smuts, Pollen Extract, Wound Dressing Adhesive Psychotropic Info: see MAR         Current Medications (08/18/2023):  This is the current hospital active medication list Current Facility-Administered Medications  Medication Dose Route Frequency Provider Last Rate Last Admin   0.9 %  sodium chloride  infusion   Intravenous Continuous Dahal, Chapman, MD 50 mL/hr at 08/17/23 1838 New Bag at 08/17/23 [phone]  acetaminophen  (TYLENOL ) tablet 1,000 mg  1,000 mg Oral TID Dahal, Binaya, MD   1,000 mg at 08/18/23 0810   albuterol  (PROVENTIL ) (2.5 MG/3ML) 0.083% nebulizer solution 2.5 mg  2.5 mg Nebulization Q6H PRN Dahal, Binaya, MD   2.5 mg at 08/18/23 9162   bisacodyl  (DULCOLAX)  EC tablet 5 mg  5 mg Oral Daily PRN Dahal, Chapman, MD       budesonide -glycopyrrolate -formoterol  (BREZTRI ) 160-9-4.8 MCG/ACT inhaler 2 puff  2 puff Inhalation Q12H Dahal, Chapman, MD   2 puff at 08/18/23 9167   buPROPion  (WELLBUTRIN  XL) 24 hr tablet 300 mg  300 mg Oral QHS Mannie Pac T, DO   300 mg at 08/17/23 2256   carbidopa -levodopa  (SINEMET  IR) 25-100 MG per tablet immediate release 1 tablet  1 tablet Oral TID Mannie Pac T, DO   1 tablet at 08/18/23 9085   enoxaparin  (LOVENOX ) injection 40 mg  40 mg Subcutaneous Q24H Dahal, Chapman, MD   40 mg at 08/18/23 9188   hydrALAZINE  (APRESOLINE ) injection 10 mg  10 mg Intravenous Q6H PRN Dahal, Chapman, MD       irbesartan  (AVAPRO ) tablet 37.5 mg  37.5 mg Oral QHS Dahal, Chapman, MD   37.5 mg at 08/17/23 2255   levothyroxine  (SYNTHROID ) tablet 88 mcg  88 mcg Oral QAC breakfast Arlice Chapman, MD   88 mcg at 08/18/23 0512   melatonin tablet 5 mg  5 mg Oral QHS Dahal, Chapman, MD   5 mg at 08/17/23 2255   methocarbamol  (ROBAXIN ) tablet 500 mg  500 mg Oral TID Dahal, Binaya, MD   500 mg at 08/18/23 0810   metoprolol  succinate (TOPROL -XL) 24 hr tablet 25 mg  25 mg Oral QHS Dahal, Chapman, MD   25 mg at 08/17/23 2255   oxyCODONE  (Oxy IR/ROXICODONE ) immediate release tablet 5 mg  5 mg Oral Q4H PRN Dahal, Chapman, MD   5 mg at 08/18/23 9085   pantoprazole  (PROTONIX ) EC tablet 40 mg  40 mg Oral QAC breakfast Dahal, Binaya, MD   40 mg at 08/18/23 0810   polyethylene glycol (MIRALAX  / GLYCOLAX ) packet 17 g  17 g Oral Daily PRN Arlice Chapman, MD   17 g at 08/17/23 1146   rosuvastatin  (CRESTOR ) tablet 10 mg  10 mg Oral QHS Dahal, Chapman, MD   10 mg at 08/17/23 2255   sertraline  (ZOLOFT ) tablet 50 mg  50 mg Oral QHS Dahal, Binaya, MD   50 mg at 08/17/23 2255     Discharge Medications: Please see discharge summary for a list of discharge medications.  Relevant Imaging Results:  Relevant Lab Results:   Additional Information SS# [id number]  NORMAN ASPEN,  LCSW

## 2023-08-19 DIAGNOSIS — I10 Essential (primary) hypertension: Secondary | ICD-10-CM | POA: Diagnosis not present

## 2023-08-19 DIAGNOSIS — I428 Other cardiomyopathies: Secondary | ICD-10-CM | POA: Diagnosis not present

## 2023-08-19 DIAGNOSIS — Z8739 Personal history of other diseases of the musculoskeletal system and connective tissue: Secondary | ICD-10-CM | POA: Diagnosis not present

## 2023-08-19 DIAGNOSIS — I499 Cardiac arrhythmia, unspecified: Secondary | ICD-10-CM | POA: Diagnosis not present

## 2023-08-19 DIAGNOSIS — S3219XD Other fracture of sacrum, subsequent encounter for fracture with routine healing: Secondary | ICD-10-CM | POA: Diagnosis not present

## 2023-08-19 DIAGNOSIS — I110 Hypertensive heart disease with heart failure: Secondary | ICD-10-CM | POA: Diagnosis not present

## 2023-08-19 DIAGNOSIS — R0989 Other specified symptoms and signs involving the circulatory and respiratory systems: Secondary | ICD-10-CM | POA: Diagnosis not present

## 2023-08-19 DIAGNOSIS — M8448XA Pathological fracture, other site, initial encounter for fracture: Secondary | ICD-10-CM | POA: Diagnosis not present

## 2023-08-19 DIAGNOSIS — Z7401 Bed confinement status: Secondary | ICD-10-CM | POA: Diagnosis not present

## 2023-08-19 DIAGNOSIS — Z87891 Personal history of nicotine dependence: Secondary | ICD-10-CM | POA: Diagnosis not present

## 2023-08-19 DIAGNOSIS — R296 Repeated falls: Secondary | ICD-10-CM | POA: Diagnosis not present

## 2023-08-19 DIAGNOSIS — S3210XA Unspecified fracture of sacrum, initial encounter for closed fracture: Secondary | ICD-10-CM | POA: Diagnosis not present

## 2023-08-19 DIAGNOSIS — M810 Age-related osteoporosis without current pathological fracture: Secondary | ICD-10-CM | POA: Diagnosis not present

## 2023-08-19 DIAGNOSIS — I5030 Unspecified diastolic (congestive) heart failure: Secondary | ICD-10-CM | POA: Diagnosis not present

## 2023-08-19 DIAGNOSIS — J449 Chronic obstructive pulmonary disease, unspecified: Secondary | ICD-10-CM | POA: Diagnosis not present

## 2023-08-19 DIAGNOSIS — I129 Hypertensive chronic kidney disease with stage 1 through stage 4 chronic kidney disease, or unspecified chronic kidney disease: Secondary | ICD-10-CM | POA: Diagnosis not present

## 2023-08-19 DIAGNOSIS — W19XXXA Unspecified fall, initial encounter: Secondary | ICD-10-CM | POA: Diagnosis not present

## 2023-08-19 DIAGNOSIS — J4551 Severe persistent asthma with (acute) exacerbation: Secondary | ICD-10-CM | POA: Diagnosis not present

## 2023-08-19 DIAGNOSIS — J9611 Chronic respiratory failure with hypoxia: Secondary | ICD-10-CM | POA: Diagnosis not present

## 2023-08-19 DIAGNOSIS — G20B2 Parkinson's disease with dyskinesia, with fluctuations: Secondary | ICD-10-CM | POA: Diagnosis not present

## 2023-08-19 DIAGNOSIS — M51369 Other intervertebral disc degeneration, lumbar region without mention of lumbar back pain or lower extremity pain: Secondary | ICD-10-CM | POA: Diagnosis not present

## 2023-08-19 DIAGNOSIS — R2681 Unsteadiness on feet: Secondary | ICD-10-CM | POA: Diagnosis not present

## 2023-08-19 DIAGNOSIS — N183 Chronic kidney disease, stage 3 unspecified: Secondary | ICD-10-CM | POA: Diagnosis not present

## 2023-08-19 DIAGNOSIS — J309 Allergic rhinitis, unspecified: Secondary | ICD-10-CM | POA: Diagnosis not present

## 2023-08-19 DIAGNOSIS — Z743 Need for continuous supervision: Secondary | ICD-10-CM | POA: Diagnosis not present

## 2023-08-19 DIAGNOSIS — R531 Weakness: Secondary | ICD-10-CM | POA: Diagnosis not present

## 2023-08-19 DIAGNOSIS — E039 Hypothyroidism, unspecified: Secondary | ICD-10-CM | POA: Diagnosis not present

## 2023-08-19 DIAGNOSIS — K219 Gastro-esophageal reflux disease without esophagitis: Secondary | ICD-10-CM | POA: Diagnosis not present

## 2023-08-19 DIAGNOSIS — R42 Dizziness and giddiness: Secondary | ICD-10-CM | POA: Diagnosis not present

## 2023-08-19 DIAGNOSIS — E782 Mixed hyperlipidemia: Secondary | ICD-10-CM | POA: Diagnosis not present

## 2023-08-19 DIAGNOSIS — Z9181 History of falling: Secondary | ICD-10-CM | POA: Diagnosis not present

## 2023-08-19 DIAGNOSIS — M6281 Muscle weakness (generalized): Secondary | ICD-10-CM | POA: Diagnosis not present

## 2023-08-19 DIAGNOSIS — R2689 Other abnormalities of gait and mobility: Secondary | ICD-10-CM | POA: Diagnosis not present

## 2023-08-19 DIAGNOSIS — S329XXD Fracture of unspecified parts of lumbosacral spine and pelvis, subsequent encounter for fracture with routine healing: Secondary | ICD-10-CM | POA: Diagnosis not present

## 2023-08-19 DIAGNOSIS — J45909 Unspecified asthma, uncomplicated: Secondary | ICD-10-CM | POA: Diagnosis not present

## 2023-08-19 DIAGNOSIS — S329XXA Fracture of unspecified parts of lumbosacral spine and pelvis, initial encounter for closed fracture: Secondary | ICD-10-CM | POA: Diagnosis not present

## 2023-08-19 DIAGNOSIS — R062 Wheezing: Secondary | ICD-10-CM | POA: Diagnosis not present

## 2023-08-19 DIAGNOSIS — I5032 Chronic diastolic (congestive) heart failure: Secondary | ICD-10-CM | POA: Diagnosis not present

## 2023-08-19 DIAGNOSIS — M353 Polymyalgia rheumatica: Secondary | ICD-10-CM | POA: Diagnosis not present

## 2023-08-19 LAB — BASIC METABOLIC PANEL WITH GFR
Anion gap: 8 (ref 5–15)
BUN: 14 mg/dL (ref 8–23)
CO2: 26 mmol/L (ref 22–32)
Calcium: 8.8 mg/dL — ABNORMAL LOW (ref 8.9–10.3)
Chloride: 99 mmol/L (ref 98–111)
Creatinine, Ser: 0.94 mg/dL (ref 0.44–1.00)
GFR, Estimated: >60 mL/min (ref >60)
Glucose, Bld: 103 mg/dL — ABNORMAL HIGH (ref 70–99)
Potassium: 4 mmol/L (ref 3.5–5.1)
Sodium: 133 mmol/L — ABNORMAL LOW (ref 135–145)

## 2023-08-19 MED ORDER — BISACODYL 10 MG RE SUPP
10.0000 mg | Freq: Once | RECTAL | Status: AC
  Administered 2023-08-19: 10 mg via RECTAL
  Filled 2023-08-19: qty 1

## 2023-08-19 MED ORDER — OXYCODONE HCL 5 MG PO TABS: 5.0000 mg | ORAL_TABLET | ORAL | 0 refills | Status: DC | PRN

## 2023-08-19 MED ORDER — POLYETHYLENE GLYCOL 3350 17 G PO PACK: 17.0000 g | PACK | Freq: Every day | ORAL | Status: DC | PRN

## 2023-08-19 NOTE — Plan of Care (Signed)

## 2023-08-19 NOTE — TOC Transition Note (Signed)
 Transition of Care [facility]) - Discharge Note   Patient Details  Name: April [name] MRN: [id number] Date of Birth: 22-May-1955  Transition of [facility]) CM/SW Contact:  NORMAN ASPEN, LCSW Phone Number: 08/19/2023, 3:59 PM   Clinical Narrative:     Have received insurance authorization for SNF bed at [facility] and pt medically cleared for dc today.  Pt aware and agreeable and will alert son.  PTAR called at 4:00 pm.  RN to call report to [phone].  No further TOC needs.  Final next level of care: Skilled Nursing Facility Barriers to Discharge: Barriers Resolved   Patient Goals and CMS Choice Patient states their goals for this hospitalization and ongoing recovery are:: pt realistic that SNF rehab will likely be needed again   Choice offered to / list presented to : Patient [place] ownership interest in [facility].provided to:: Patient    Discharge Placement PASRR number recieved: 08/18/23            Patient chooses bed at: [facility] Patient to be transferred to facility by: PTAR Name of family member notified: pt notifying son Patient and family notified of of transfer: 08/19/23  Discharge Plan and Services Additional resources added to the After Visit Summary for   In-house Referral: Clinical Social Work   Post Acute Care Choice: Skilled Nursing Facility          DME Arranged: N/A DME Agency: NA                  Social Drivers of Health (SDOH) Interventions SDOH Screenings   Food Insecurity: No Food Insecurity (08/17/2023)  Housing: Low Risk  (08/17/2023)  Recent Concern: Housing - High Risk (07/23/2023)  Transportation Needs: No Transportation Needs (08/17/2023)  Utilities: Not At Risk (08/17/2023)  Alcohol  Screen: Low Risk  (11/26/2021)  Depression (PHQ2-9): Low Risk  (04/08/2023)  Financial Resource Strain: Medium Risk (12/01/2022)  Physical Activity: Insufficiently Active (12/01/2022)  Social Connections: Socially Isolated  (08/17/2023)  Stress: No Stress Concern Present (12/01/2022)  Tobacco Use: Medium Risk (08/16/2023)  Health Literacy: Adequate Health Literacy (12/01/2022)     Readmission Risk Interventions     No data to display

## 2023-08-19 NOTE — Discharge Summary (Signed)
 Physician Discharge Summary   Patient: April [name] MRN: [id number] DOB: 10-06-1955  Admit date:     08/16/2023  Discharge date: 08/19/23  Discharge Physician: Deliliah Room   PCP: Tonnie Jon SAILOR, NP   Recommendations at discharge:    Follow up with your PCP in one week. Follow up out patient with ortho in 2 weeks Continue taking meds as prescribed   Discharge Diagnoses: Principal Problem:   Pelvic fracture [facility])   Hospital Course:  Intractable pelvic pain secondary to bilateral sacral insufficiency fractures, POA: Bilateral sacral insufficiency fractures H/o polymyalgia rheumatica Presented with worsening pain after a fall last week. Imaging as above showed minimally displaced bilateral sacral insufficiency fractures -PRN Oxycodone  5 mg every 4 hours for severe pain No indication for surgical management Follow up with ortho in 2 weeks.   Nonischemic cardiomyopathy Essential hypertension Valsartan  40 mg nightly   Hyperlipidemia: Continue Crestor    Acute: Likely prerenal, received IVF, resolved      COPD, POA: Not in exacerbation Chronic hypoxic respiratory failure,POA:  On 4 L oxygen  at home Continue bronchodilators   Hypothyroidism On Synthroid      Anxiety/major moderate depression Continue with antidepressants Melatonin nightly   Disposition: [facility]      Consultants: None Procedures performed: None  Disposition: Skilled nursing facility Diet recommendation:  Regular diet DISCHARGE MEDICATION: Allergies as of 08/19/2023       Reactions   Sulfa Antibiotics Anaphylaxis, Hives, Dermatitis   Sulfonamide Derivatives Anaphylaxis, Dermatitis, Rash, Other (See Comments)   Reaction occurred when patient was a child. Throat closed, plus a rash.   Alendronate Sodium Other (See Comments)   GI upset   Dilaudid  [hydromorphone  Hcl] Nausea And Vomiting   Molds & Smuts Itching, Other (See Comments)   Itchy eyes/runny nose   Pollen Extract  Itching, Other (See Comments)   Itchy eyes/runny nose   Wound Dressing Adhesive Other (See Comments)   Redness, adhesive tapes. Needs PAPER TAPE.        Medication List     STOP taking these medications    HYDROcodone -acetaminophen  5-325 MG tablet Commonly known as: NORCO/VICODIN       TAKE these medications    acetaminophen  650 MG CR tablet Commonly known as: TYLENOL  Take 1,300 mg by mouth at bedtime as needed for pain.   albuterol  108 (90 Base) MCG/ACT inhaler Commonly known as: VENTOLIN  HFA Inhale 2 puffs into the lungs every 6 (six) hours as needed for wheezing or shortness of breath.   azithromycin  250 MG tablet Commonly known as: ZITHROMAX  Take 1 tablet (250 mg total) by mouth every Monday, Wednesday, and Friday.   Breztri  Aerosphere 160-9-4.8 MCG/ACT Aero inhaler Generic drug: budesonide -glycopyrrolate -formoterol  Inhale 2 puffs into the lungs every 12 (twelve) hours.   buPROPion  300 MG 24 hr tablet Commonly known as: WELLBUTRIN  XL TAKE 1 TABLET BY MOUTH DAILY What changed: when to take this   Calcium  500 MG Chew Chew 500 mg by mouth 2 (two) times daily.   carbidopa -levodopa  25-100 MG tablet Commonly known as: SINEMET  IR TAKE 1 TABLET BY MOUTH 3 TIMES A DAY What changed:  when to take this additional instructions   feeding supplement Liqd Take 237 mLs by mouth 2 (two) times daily between meals.   furosemide  20 MG tablet Commonly known as: LASIX  TAKE 1 TABLET BY MOUTH DAILY AS NEEDED FOR EDEMA What changed: See the new instructions.   levothyroxine  88 MCG tablet Commonly known as: SYNTHROID  Take 88 mcg by mouth daily before  breakfast.   melatonin 5 MG Tabs Take 1 tablet (5 mg total) by mouth at bedtime.   methocarbamol  750 MG tablet Commonly known as: ROBAXIN  Take 750 mg by mouth 2 (two) times daily as needed for muscle spasms (if not takingTizanidine).   metoprolol  succinate 25 MG 24 hr tablet Commonly known as: TOPROL -XL Take 1 tablet  (25 mg total) by mouth daily. What changed: when to take this   omeprazole  20 MG capsule Commonly known as: PRILOSEC TAKE 1 CAPSULE BY MOUTH EVERY DAY What changed:  how much to take when to take this   oxyCODONE  5 MG immediate release tablet Commonly known as: Oxy IR/ROXICODONE  Take 1 tablet (5 mg total) by mouth every 4 (four) hours as needed for severe pain (pain score 7-10).   polyethylene glycol 17 g packet Commonly known as: MIRALAX  / GLYCOLAX  Take 17 g by mouth daily as needed for moderate constipation.   potassium chloride  10 MEQ tablet Commonly known as: KLOR-CON  M Take 1 tablet (10 mEq total) by mouth daily as needed (when you take the lasix ). What changed:  when to take this additional instructions   predniSONE  10 MG tablet Commonly known as: DELTASONE  Take 10 mg by mouth daily with breakfast.   rosuvastatin  10 MG tablet Commonly known as: CRESTOR  Take 1 tablet (10 mg total) by mouth at bedtime.   sertraline  50 MG tablet Commonly known as: ZOLOFT  Take 50 mg by mouth at bedtime.   Systane Ultra 0.4-0.3 % Soln Generic drug: Polyethyl Glycol-Propyl Glycol Place 1 drop into both eyes 2 (two) times daily.   tiZANidine  4 MG tablet Commonly known as: ZANAFLEX  Take 4 mg by mouth every 6 (six) hours as needed for muscle spasms (if not taking Methocarbamol ).   valsartan  40 MG tablet Commonly known as: DIOVAN  Take 40 mg by mouth at bedtime.   VITAMIN B 12 PO Take 1 capsule by mouth daily.   Vitamin D -3 125 MCG (5000 UT) Tabs Take 5,000 Units by mouth daily.        Follow-up Information     Tonnie Jon SAILOR, NP. Schedule an appointment as soon as possible for a visit in 1 week(s).   Specialty: Nurse Practitioner Contact information: [address] [place] [address] [phone]         [facility], [facility]. Schedule an appointment as soon as possible for a visit in 2 week(s).   Specialty: Specialist Contact information: [address] [place] [address] [phone]                Discharge Exam: Filed Weights   08/16/23 1023  Weight: 76 kg   General exam: Appears calm and comfortable  Respiratory system: Clear to auscultation. Respiratory effort normal. Cardiovascular system: S1 & S2 heard, RRR. No JVD, murmurs, rubs, gallops or clicks. No pedal edema. Gastrointestinal system: Abdomen is nondistended, soft and nontender. No organomegaly or masses felt. Normal bowel sounds heard. Central nervous system: Alert and oriented. No focal neurological deficits. Extremities: Limited mobility of the lower extremity secondary to bilateral sacral insufficiency fractures and pain Skin: No rashes, lesions or ulcers Psychiatry: Judgement and insight appear normal. Mood & affect appropriate.  Condition at discharge: good  The results of significant diagnostics from this hospitalization (including imaging, microbiology, ancillary and laboratory) are listed below for reference.   Imaging Studies: CT PELVIS WO CONTRAST Result Date: 08/16/2023 EXAM: CT PELVIS, WITHOUT IV CONTRAST 08/16/2023 04:08:31 PM TECHNIQUE: Axial images were acquired through the pelvis  without IV contrast. Reformatted images were reviewed. Automated exposure control, iterative reconstruction, and/or weight based adjustment of the mA/kV was utilized to reduce the radiation dose to as low as reasonably achievable. COMPARISON: MRI of earlier today. CLINICAL HISTORY: Sacral fracture. Per triage notes: Per [facility] pt [facility] independent living c/o lower back pain radiating down into legs x 2 weeks. Patient on 4 L [place] at baseline. FINDINGS: BONES: Osteopenia. Minimally displaced fractures in the sagittal plane within both sides of the sacrum, most typical of sacral insufficiency fractures (example image 35 of series 11). Fractures likely extend to the bilateral sacroiliac joint, without displaced components. No sacroiliac joint  widening. JOINTS: No dislocation. The joint spaces are normal. No sacroiliac joint widening. SOFT TISSUES: Moderate pelvic floor laxity. INTRAPELVIC CONTENTS: Hysterectomy. Contrast within the urinary bladder, presumably from today's MRI. Colonic diverticulosis. Large amount of stool in the cecum. ARTIFACT: Beam hardening artifact from bilateral hip arthroplasty. IMPRESSION: 1. Minimally displaced bilateral sacral insufficiency fractures. Electronically signed by: Rockey Kilts MD 08/16/2023 05:23 PM EDT RP Workstation: [id number]   MR Lumbar Spine W Wo Contrast Result Date: 08/16/2023 EXAM: MRI LUMBAR SPINE 08/16/2023 12:45:13 PM TECHNIQUE: Multiplanar multisequence MRI of the lumbar spine was performed with and without the administration of 7.5 mL of intravenous gadobutrol  (GADAVIST ). COMPARISON: None available. CLINICAL HISTORY: Low back pain, cauda equina syndrome suspected. FINDINGS: BONES AND ALIGNMENT: Fusion across the T12-L1 disc space. Hyperintense T2-weighted signal and abnormal contrast enhancement within the S2 body of the sacrum incompletely visualized. SPINAL CORD: The conus terminates normally. SOFT TISSUES: No paraspinal mass. L1-L2: No significant disc herniation. No spinal canal stenosis or neural foraminal narrowing. L2-L3: No significant disc herniation. No spinal canal stenosis or neural foraminal narrowing. L3-L4: Minimal disc bulge without stenosis. L4-L5: Small disc bulge and mild facet hypertrophy with narrowing at the lateral recesses. No central spinal canal stenosis or neural impingement. L5-S1: No significant disc herniation. No spinal canal stenosis or neural foraminal narrowing. IMPRESSION: 1. No cauda equina compression. 2. Hyperintense T2-weighted signal and abnormal contrast enhancement within the S2 body of the sacrum, incompletely visualized. This may indicate a sacral insufficiency fracture. If there is any history of malignancy, metastatic disease is also a possibility. 3.  L4-5 small disc bulge and mild facet hypertrophy with narrowing at the lateral recesses, no central spinal canal stenosis or neural impingement. Electronically signed by: Franky Stanford MD 08/16/2023 01:56 PM EDT RP Workstation: [id number]   ECHOCARDIOGRAM COMPLETE Result Date: 07/24/2023    ECHOCARDIOGRAM REPORT   Patient Name:   SNEHA WILLIG Date of Exam: 07/24/2023 Medical Rec #:  [id number]        Height:       66.5 in Accession #:    [id number]       Weight:       168.4 lb Date of Birth:  Apr 14, 1955         BSA:          1.869 m Patient Age:    68 years         BP:           117/65 mmHg Patient Gender: F                HR:           107 bpm. Exam Location:  Inpatient Procedure: 2D Echo, Color Doppler and Cardiac Doppler (Both Spectral and Color            Flow Doppler were  utilized during procedure). Indications:    Abnormal ECG R94.31  History:        Patient has prior history of Echocardiogram examinations, most                 recent 06/10/2021. CAD, Abnormal ECG, COPD and CKD,                 Signs/Symptoms:Shortness of Breath and Dyspnea; Risk                 Factors:Hypertension, Dyslipidemia and Former Smoker.  Sonographer:    Koleen Popper RDCS Referring Phys: [id number] ALMARIE MATSU MATHEWS  Sonographer Comments: Technically challenging study due to limited acoustic windows. Image acquisition challenging due to COPD and Image acquisition challenging due to respiratory motion. IMPRESSIONS  1. Left ventricular ejection fraction, by estimation, is 50 to 55%. The left ventricle has low normal function. The left ventricle has no regional wall motion abnormalities. Left ventricular diastolic parameters are consistent with Grade I diastolic dysfunction (impaired relaxation).  2. Right ventricular systolic function is normal. The right ventricular size is normal. There is normal pulmonary artery systolic pressure.  3. The mitral valve is normal in structure. No evidence of mitral valve regurgitation. No evidence  of mitral stenosis.  4. The aortic valve is normal in structure. Aortic valve regurgitation is not visualized. No aortic stenosis is present.  5. The inferior vena cava is normal in size with greater than 50% respiratory variability, suggesting right atrial pressure of 3 mmHg. FINDINGS  Left Ventricle: Left ventricular ejection fraction, by estimation, is 50 to 55%. The left ventricle has low normal function. The left ventricle has no regional wall motion abnormalities. The left ventricular internal cavity size was normal in size. There is no left ventricular hypertrophy. Left ventricular diastolic parameters are consistent with Grade I diastolic dysfunction (impaired relaxation). Right Ventricle: The right ventricular size is normal. No increase in right ventricular wall thickness. Right ventricular systolic function is normal. There is normal pulmonary artery systolic pressure. The tricuspid regurgitant velocity is 2.10 m/s, and  with an assumed right atrial pressure of 3 mmHg, the estimated right ventricular systolic pressure is 20.6 mmHg. Left Atrium: Left atrial size was normal in size. Right Atrium: Right atrial size was normal in size. Pericardium: Trivial pericardial effusion is present. The pericardial effusion is circumferential. Presence of epicardial fat layer. Mitral Valve: The mitral valve is normal in structure. No evidence of mitral valve regurgitation. No evidence of mitral valve stenosis. Tricuspid Valve: The tricuspid valve is normal in structure. Tricuspid valve regurgitation is not demonstrated. No evidence of tricuspid stenosis. Aortic Valve: The aortic valve is normal in structure. Aortic valve regurgitation is not visualized. No aortic stenosis is present. Pulmonic Valve: The pulmonic valve was normal in structure. Pulmonic valve regurgitation is not visualized. No evidence of pulmonic stenosis. Aorta: The aortic root is normal in size and structure. Venous: The inferior vena cava is normal  in size with greater than 50% respiratory variability, suggesting right atrial pressure of 3 mmHg. IAS/Shunts: No atrial level shunt detected by color flow Doppler.  LEFT VENTRICLE PLAX 2D LVIDd:         4.30 cm   Diastology LVIDs:         3.20 cm   LV e' medial:    6.53 cm/s LV PW:         0.90 cm   LV E/e' medial:  10.6 LV IVS:  0.90 cm   LV e' lateral:   10.40 cm/s LVOT diam:     1.80 cm   LV E/e' lateral: 6.6 LV SV:         44 LV SV Index:   24 LVOT Area:     2.54 cm  IVC IVC diam: 1.60 cm LEFT ATRIUM         Index LA diam:    2.80 cm 1.50 cm/m  AORTIC VALVE LVOT Vmax:   108.00 cm/s LVOT Vmean:  75.500 cm/s LVOT VTI:    0.174 m  AORTA Ao Root diam: 3.00 cm MITRAL VALVE                TRICUSPID VALVE MV Area (PHT): 4.96 cm     TR Peak grad:   17.6 mmHg MV Decel Time: 153 msec     TR Vmax:        210.00 cm/s MV E velocity: 68.90 cm/s MV A velocity: 110.00 cm/s  SHUNTS MV E/A ratio:  0.63         Systemic VTI:  0.17 m                             Systemic Diam: 1.80 cm Kardie Tobb DO Electronically signed by Dub Huntsman DO Signature Date/Time: 07/24/2023/1:51:54 PM    Final    CT Angio Chest PE W and/or Wo Contrast Result Date: 07/22/2023 CLINICAL DATA:  Positive D-dimer and shortness of breath EXAM: CT ANGIOGRAPHY CHEST WITH CONTRAST TECHNIQUE: Multidetector CT imaging of the chest was performed using the standard protocol during bolus administration of intravenous contrast. Multiplanar CT image reconstructions and MIPs were obtained to evaluate the vascular anatomy. RADIATION DOSE REDUCTION: This exam was performed according to the departmental dose-optimization program which includes automated exposure control, adjustment of the mA and/or kV according to patient size and/or use of iterative reconstruction technique. CONTRAST:  75mL OMNIPAQUE  IOHEXOL  350 MG/ML SOLN COMPARISON:  Chest x-ray from earlier in the same day. FINDINGS: Cardiovascular: Atherosclerotic calcifications of the thoracic aorta are  noted. No aneurysmal dilatation or dissection is noted. Heart is not significantly enlarged. Pulmonary artery is well visualized within normal branching pattern bilaterally. No filling defect to suggest pulmonary embolism is noted. No significant coronary calcifications are noted. Mediastinum/Nodes: Thoracic inlet is within normal limits. No hilar or mediastinal adenopathy is noted. The esophagus as visualized is within normal limits. Lungs/Pleura: Emphysematous changes of the lower lungs are seen. No focal infiltrate or sizable effusion is noted. Scarring in the right apex is again identified. No other focal abnormality is noted. Upper Abdomen: Gallbladder has been surgically removed. Musculoskeletal: Degenerative changes of the thoracic spine are noted. Review of the MIP images confirms the above findings. IMPRESSION: No evidence of pulmonary emboli. No acute abnormality noted. Aortic Atherosclerosis (ICD10-I70.0) and Emphysema (ICD10-J43.9). Electronically Signed   By: Oneil Devonshire M.D.   On: 07/22/2023 22:40   DG Chest 2 View Result Date: 07/22/2023 EXAM: 2 VIEW(S) XRAY OF THE CHEST 07/22/2023 06:23:00 PM COMPARISON: Dry 16 2025 CLINICAL HISTORY: SOB. Was seen yesterday for same started she felt SOB this afternoon and it got worse O2 sat down to 82 at home RA 95 now. States her feet are swelling now. FINDINGS: LUNGS AND PLEURA: No focal pulmonary opacity. No pulmonary edema. No pleural effusion. No pneumothorax. Hyperinflation and chronic bronchitic change. HEART AND MEDIASTINUM: No acute abnormality of the cardiac and mediastinal silhouettes. BONES AND SOFT  TISSUES: No acute osseous abnormality. IMPRESSION: 1. No acute process. Hyperinflation. Electronically signed by: Norman Gatlin MD 07/22/2023 06:55 PM EDT RP Workstation: [id number]   DG Sacrum/Coccyx Result Date: 07/21/2023 CLINICAL DATA:  Pain after fall EXAM: SACRUM AND COCCYX - 2+ VIEW COMPARISON:  05/11/2023 FINDINGS: Bilateral hip replacements.  Patent SI joints. No definite acute displaced fracture is seen. IMPRESSION: No definite acute osseous abnormality. Bilateral hip replacements. Cross-sectional imaging follow-up if high suspicion for fracture. Electronically Signed   By: [name] Bun M.D.   On: 07/21/2023 16:52   CT Cervical Spine Wo Contrast Result Date: 07/21/2023 CLINICAL DATA:  Neck trauma EXAM: CT CERVICAL SPINE WITHOUT CONTRAST TECHNIQUE: Multidetector CT imaging of the cervical spine was performed without intravenous contrast. Multiplanar CT image reconstructions were also generated. RADIATION DOSE REDUCTION: This exam was performed according to the departmental dose-optimization program which includes automated exposure control, adjustment of the mA and/or kV according to patient size and/or use of iterative reconstruction technique. COMPARISON:  CT 10/16/2021 FINDINGS: Alignment: No subluxation.  Facet alignment is within normal limits. Skull base and vertebrae: No acute fracture. No primary bone lesion or focal pathologic process. Soft tissues and spinal canal: No prevertebral fluid or swelling. No visible canal hematoma. Disc levels: Multilevel degenerative changes. Moderate disc space narrowing C5-C6 and C6-C7. Multilevel hypertrophic facet degenerative changes with foraminal narrowing. Upper chest: Apical emphysema and scarring Other: None IMPRESSION: Degenerative changes without acute osseous abnormality. Electronically Signed   By: [name] Bun M.D.   On: 07/21/2023 16:48   CT Head Wo Contrast Result Date: 07/21/2023 CLINICAL DATA:  Head trauma EXAM: CT HEAD WITHOUT CONTRAST TECHNIQUE: Contiguous axial images were obtained from the base of the skull through the vertex without intravenous contrast. RADIATION DOSE REDUCTION: This exam was performed according to the departmental dose-optimization program which includes automated exposure control, adjustment of the mA and/or kV according to patient size and/or use of iterative  reconstruction technique. COMPARISON:  Head CT 01/15/2022, MRI 01/15/2022 FINDINGS: Brain: No acute territorial infarction, hemorrhage, or intracranial mass. Atrophy and mild chronic small vessel ischemic changes of the white matter. Stable ventricle size. Vascular: No hyperdense vessels.  Carotid vascular calcification. Skull: Normal. Negative for fracture or focal lesion. Sinuses/Orbits: No acute finding. Other: Left posterior scalp hematoma IMPRESSION: 1. No CT evidence for acute intracranial abnormality. 2. Atrophy and mild chronic small vessel ischemic changes of the white matter. Left posterior scalp hematoma. Electronically Signed   By: [name] Bun M.D.   On: 07/21/2023 16:45   DG Thoracic Spine 2 View Result Date: 07/21/2023 CLINICAL DATA:  Fall with back pain EXAM: THORACIC SPINE 2 VIEWS COMPARISON:  Chest CT 12/14/2022 FINDINGS: Thoracic alignment is within normal limits. Vertebral body heights are maintained. Moderate multilevel degenerative osteophyte of the mid to lower thoracic spine. IMPRESSION: No acute osseous abnormality. Electronically Signed   By: [name] Bun M.D.   On: 07/21/2023 16:41   DG Chest 1 View Result Date: 07/21/2023 CLINICAL DATA:  Pain EXAM: CHEST  1 VIEW COMPARISON:  None Available. FINDINGS: Hyperinflation. No consolidation, pneumothorax or effusion. No edema. Normal cardiopericardial silhouette. Fixation hardware along the left shoulder for arthroplasty at the edge of the imaging field. Degenerative changes of the spine. IMPRESSION: Hyperinflation with chronic changes. No acute cardiopulmonary disease. Electronically Signed   By: Ranell Bring M.D.   On: 07/21/2023 16:39    Microbiology: Results for orders placed or performed during the hospital encounter of 09/26/20  SARS CORONAVIRUS 2 (TAT 6-24 HRS) Nasopharyngeal  Nasopharyngeal Swab     Status: None   Collection Time: 09/27/20  1:30 AM   Specimen: Nasopharyngeal Swab  Result Value Ref Range Status   SARS  Coronavirus 2 NEGATIVE NEGATIVE Final    Comment: (NOTE) SARS-CoV-2 target nucleic acids are NOT DETECTED.  The SARS-CoV-2 RNA is generally detectable in upper and lower respiratory specimens during the acute phase of infection. Negative results do not preclude SARS-CoV-2 infection, do not rule out co-infections with other pathogens, and should not be used as the sole basis for treatment or other patient management decisions. Negative results must be combined with clinical observations, patient history, and epidemiological information. The expected result is Negative.  Fact Sheet for Patients: HairSlick.no  Fact Sheet for Healthcare Providers: quierodirigir.com  This test is not yet approved or cleared by the [place]  FDA and  has been authorized for detection and/or diagnosis of SARS-CoV-2 by FDA under an Emergency Use Authorization (EUA). This EUA will remain  in effect (meaning this test can be used) for the duration of the COVID-19 declaration under Se ction 564(b)(1) of the Act, 21 U.S.C. section 360bbb-3(b)(1), unless the authorization is terminated or revoked sooner.  Performed at [facility], [address], [place], [place] [address]     Labs: CBC: Recent Labs  Lab 08/16/23 1057 08/18/23 0334  WBC 9.1 5.1  NEUTROABS 7.8*  --   HGB 10.8* 10.5*  HCT 34.6* 33.8*  MCV 98.0 100.3*  PLT 183 189   Basic Metabolic Panel: Recent Labs  Lab 08/16/23 1057 08/18/23 0334 08/19/23 0328  NA 138 135 133*  K 4.3 4.1 4.0  CL 100 101 99  CO2 24 27 26   GLUCOSE 113* 120* 103*  BUN 25* 21 14  CREATININE 1.15* 1.22* 0.94  CALCIUM  9.2 8.7* 8.8*   Liver Function Tests: Recent Labs  Lab 08/16/23 1057  AST 19  ALT 18  ALKPHOS 111  BILITOT 0.6  PROT 6.6  ALBUMIN 3.2*   CBG: No results for input(s): GLUCAP in the last 168 hours.  Discharge time spent: 41 minutes.  Signed: Deliliah Room,  MD [facility] 08/19/2023

## 2023-08-20 DIAGNOSIS — M353 Polymyalgia rheumatica: Secondary | ICD-10-CM | POA: Diagnosis not present

## 2023-08-20 DIAGNOSIS — M6281 Muscle weakness (generalized): Secondary | ICD-10-CM | POA: Diagnosis not present

## 2023-08-20 DIAGNOSIS — I5030 Unspecified diastolic (congestive) heart failure: Secondary | ICD-10-CM | POA: Diagnosis not present

## 2023-08-20 DIAGNOSIS — E039 Hypothyroidism, unspecified: Secondary | ICD-10-CM | POA: Diagnosis not present

## 2023-08-20 DIAGNOSIS — J449 Chronic obstructive pulmonary disease, unspecified: Secondary | ICD-10-CM | POA: Diagnosis not present

## 2023-08-20 DIAGNOSIS — S329XXD Fracture of unspecified parts of lumbosacral spine and pelvis, subsequent encounter for fracture with routine healing: Secondary | ICD-10-CM | POA: Diagnosis not present

## 2023-08-20 DIAGNOSIS — G20B2 Parkinson's disease with dyskinesia, with fluctuations: Secondary | ICD-10-CM | POA: Diagnosis not present

## 2023-08-23 ENCOUNTER — Ambulatory Visit: Admitting: Neurology

## 2023-08-23 ENCOUNTER — Telehealth: Payer: Self-pay | Admitting: Neurology

## 2023-08-23 DIAGNOSIS — G20B2 Parkinson's disease with dyskinesia, with fluctuations: Secondary | ICD-10-CM | POA: Diagnosis not present

## 2023-08-23 DIAGNOSIS — M353 Polymyalgia rheumatica: Secondary | ICD-10-CM | POA: Diagnosis not present

## 2023-08-23 DIAGNOSIS — R2689 Other abnormalities of gait and mobility: Secondary | ICD-10-CM | POA: Diagnosis not present

## 2023-08-23 DIAGNOSIS — E039 Hypothyroidism, unspecified: Secondary | ICD-10-CM | POA: Diagnosis not present

## 2023-08-23 DIAGNOSIS — M6281 Muscle weakness (generalized): Secondary | ICD-10-CM | POA: Diagnosis not present

## 2023-08-23 DIAGNOSIS — S3219XD Other fracture of sacrum, subsequent encounter for fracture with routine healing: Secondary | ICD-10-CM | POA: Diagnosis not present

## 2023-08-23 DIAGNOSIS — I5030 Unspecified diastolic (congestive) heart failure: Secondary | ICD-10-CM | POA: Diagnosis not present

## 2023-08-23 DIAGNOSIS — Z9181 History of falling: Secondary | ICD-10-CM | POA: Diagnosis not present

## 2023-08-23 DIAGNOSIS — J449 Chronic obstructive pulmonary disease, unspecified: Secondary | ICD-10-CM | POA: Diagnosis not present

## 2023-08-23 DIAGNOSIS — S329XXD Fracture of unspecified parts of lumbosacral spine and pelvis, subsequent encounter for fracture with routine healing: Secondary | ICD-10-CM | POA: Diagnosis not present

## 2023-08-23 NOTE — Telephone Encounter (Signed)
 Pt.friend Thresa Pizza called and stated Pt is having Paranoia associate with [facility]. Pt. Is in a facility and friend said he would also let them know.

## 2023-08-24 DIAGNOSIS — G20B2 Parkinson's disease with dyskinesia, with fluctuations: Secondary | ICD-10-CM | POA: Diagnosis not present

## 2023-08-24 DIAGNOSIS — M6281 Muscle weakness (generalized): Secondary | ICD-10-CM | POA: Diagnosis not present

## 2023-08-24 DIAGNOSIS — E039 Hypothyroidism, unspecified: Secondary | ICD-10-CM | POA: Diagnosis not present

## 2023-08-24 DIAGNOSIS — I5030 Unspecified diastolic (congestive) heart failure: Secondary | ICD-10-CM | POA: Diagnosis not present

## 2023-08-24 DIAGNOSIS — M353 Polymyalgia rheumatica: Secondary | ICD-10-CM | POA: Diagnosis not present

## 2023-08-24 DIAGNOSIS — S329XXD Fracture of unspecified parts of lumbosacral spine and pelvis, subsequent encounter for fracture with routine healing: Secondary | ICD-10-CM | POA: Diagnosis not present

## 2023-08-24 DIAGNOSIS — J449 Chronic obstructive pulmonary disease, unspecified: Secondary | ICD-10-CM | POA: Diagnosis not present

## 2023-08-25 DIAGNOSIS — J449 Chronic obstructive pulmonary disease, unspecified: Secondary | ICD-10-CM | POA: Diagnosis not present

## 2023-08-25 DIAGNOSIS — G20B2 Parkinson's disease with dyskinesia, with fluctuations: Secondary | ICD-10-CM | POA: Diagnosis not present

## 2023-08-25 DIAGNOSIS — M353 Polymyalgia rheumatica: Secondary | ICD-10-CM | POA: Diagnosis not present

## 2023-08-25 DIAGNOSIS — I5030 Unspecified diastolic (congestive) heart failure: Secondary | ICD-10-CM | POA: Diagnosis not present

## 2023-08-25 DIAGNOSIS — S329XXD Fracture of unspecified parts of lumbosacral spine and pelvis, subsequent encounter for fracture with routine healing: Secondary | ICD-10-CM | POA: Diagnosis not present

## 2023-08-25 DIAGNOSIS — M6281 Muscle weakness (generalized): Secondary | ICD-10-CM | POA: Diagnosis not present

## 2023-08-25 DIAGNOSIS — E039 Hypothyroidism, unspecified: Secondary | ICD-10-CM | POA: Diagnosis not present

## 2023-08-26 DIAGNOSIS — I10 Essential (primary) hypertension: Secondary | ICD-10-CM | POA: Diagnosis not present

## 2023-08-26 DIAGNOSIS — G20B2 Parkinson's disease with dyskinesia, with fluctuations: Secondary | ICD-10-CM | POA: Diagnosis not present

## 2023-08-26 DIAGNOSIS — M8448XA Pathological fracture, other site, initial encounter for fracture: Secondary | ICD-10-CM | POA: Diagnosis not present

## 2023-08-26 DIAGNOSIS — S3219XD Other fracture of sacrum, subsequent encounter for fracture with routine healing: Secondary | ICD-10-CM | POA: Diagnosis not present

## 2023-08-26 DIAGNOSIS — N183 Chronic kidney disease, stage 3 unspecified: Secondary | ICD-10-CM | POA: Diagnosis not present

## 2023-08-26 DIAGNOSIS — I5032 Chronic diastolic (congestive) heart failure: Secondary | ICD-10-CM | POA: Diagnosis not present

## 2023-08-26 DIAGNOSIS — R296 Repeated falls: Secondary | ICD-10-CM | POA: Diagnosis not present

## 2023-08-26 DIAGNOSIS — R2689 Other abnormalities of gait and mobility: Secondary | ICD-10-CM | POA: Diagnosis not present

## 2023-08-26 DIAGNOSIS — M6281 Muscle weakness (generalized): Secondary | ICD-10-CM | POA: Diagnosis not present

## 2023-08-26 DIAGNOSIS — R0989 Other specified symptoms and signs involving the circulatory and respiratory systems: Secondary | ICD-10-CM | POA: Diagnosis not present

## 2023-08-26 DIAGNOSIS — I428 Other cardiomyopathies: Secondary | ICD-10-CM | POA: Diagnosis not present

## 2023-08-26 DIAGNOSIS — Z9181 History of falling: Secondary | ICD-10-CM | POA: Diagnosis not present

## 2023-08-26 DIAGNOSIS — E039 Hypothyroidism, unspecified: Secondary | ICD-10-CM | POA: Diagnosis not present

## 2023-08-26 DIAGNOSIS — J449 Chronic obstructive pulmonary disease, unspecified: Secondary | ICD-10-CM | POA: Diagnosis not present

## 2023-08-27 DIAGNOSIS — G20B2 Parkinson's disease with dyskinesia, with fluctuations: Secondary | ICD-10-CM | POA: Diagnosis not present

## 2023-08-27 DIAGNOSIS — I5030 Unspecified diastolic (congestive) heart failure: Secondary | ICD-10-CM | POA: Diagnosis not present

## 2023-08-27 DIAGNOSIS — M353 Polymyalgia rheumatica: Secondary | ICD-10-CM | POA: Diagnosis not present

## 2023-08-27 DIAGNOSIS — J449 Chronic obstructive pulmonary disease, unspecified: Secondary | ICD-10-CM | POA: Diagnosis not present

## 2023-08-27 DIAGNOSIS — S329XXD Fracture of unspecified parts of lumbosacral spine and pelvis, subsequent encounter for fracture with routine healing: Secondary | ICD-10-CM | POA: Diagnosis not present

## 2023-08-27 DIAGNOSIS — E039 Hypothyroidism, unspecified: Secondary | ICD-10-CM | POA: Diagnosis not present

## 2023-08-27 DIAGNOSIS — M6281 Muscle weakness (generalized): Secondary | ICD-10-CM | POA: Diagnosis not present

## 2023-08-30 DIAGNOSIS — E039 Hypothyroidism, unspecified: Secondary | ICD-10-CM | POA: Diagnosis not present

## 2023-08-30 DIAGNOSIS — S329XXD Fracture of unspecified parts of lumbosacral spine and pelvis, subsequent encounter for fracture with routine healing: Secondary | ICD-10-CM | POA: Diagnosis not present

## 2023-08-30 DIAGNOSIS — Z9181 History of falling: Secondary | ICD-10-CM | POA: Diagnosis not present

## 2023-08-30 DIAGNOSIS — I5030 Unspecified diastolic (congestive) heart failure: Secondary | ICD-10-CM | POA: Diagnosis not present

## 2023-08-30 DIAGNOSIS — J449 Chronic obstructive pulmonary disease, unspecified: Secondary | ICD-10-CM | POA: Diagnosis not present

## 2023-08-30 DIAGNOSIS — R2689 Other abnormalities of gait and mobility: Secondary | ICD-10-CM | POA: Diagnosis not present

## 2023-08-30 DIAGNOSIS — I129 Hypertensive chronic kidney disease with stage 1 through stage 4 chronic kidney disease, or unspecified chronic kidney disease: Secondary | ICD-10-CM | POA: Diagnosis not present

## 2023-08-30 DIAGNOSIS — M6281 Muscle weakness (generalized): Secondary | ICD-10-CM | POA: Diagnosis not present

## 2023-08-30 DIAGNOSIS — M353 Polymyalgia rheumatica: Secondary | ICD-10-CM | POA: Diagnosis not present

## 2023-08-30 DIAGNOSIS — S3219XD Other fracture of sacrum, subsequent encounter for fracture with routine healing: Secondary | ICD-10-CM | POA: Diagnosis not present

## 2023-08-30 DIAGNOSIS — G20B2 Parkinson's disease with dyskinesia, with fluctuations: Secondary | ICD-10-CM | POA: Diagnosis not present

## 2023-08-30 NOTE — Progress Notes (Signed)
 @Patient  ID: April [name], female    DOB: [birth year], 68 y.o.   MRN: [id number]  No chief complaint on file.   Referring provider: Jodie Lavern CROME, MD  HPI: 68 year old, former smoker. Former Dr. Brenna patient followed for severe COPD on triple therapy and Azithromycin  MWF  Previous [facility] pulmonary encounter OV 09/16/2022: Here today for follow-up regarding COPD management.She is also enrolled in lung cancer screening and has a repeat lung cancer screening CT due in December 2024.  Last office visit we discussed her ongoing physical therapy needs.  Using triple therapy inhaler regimen as well as azithromycin  Monday Wednesday Friday.  Doing well with her current regimen with no recent exacerbations.   03/30/2023 Discussed the use of AI scribe software for clinical note transcription with the patient, who gave verbal consent to proceed.  History of Present Illness   April [name] is a 68 year old female with COPD and a lung nodule who presents for follow-up.  She has severe COPD with an asthmatic component and experiences increased wheezing. She uses Breztri , taking two puffs in the morning and evening, but sometimes requires three puffs to alleviate wheezing. She also uses azithromycin  on Monday, Wednesday, and Friday. A nebulizer is available at home, and she has a history of long-term prednisone  use in the 1990s.  She is part of a lung cancer screening program due to her history as a former smoker, having quit six to seven years ago. Her last CT scan in December 2024 showed some scarring and emphysema but no concerning lung nodules. A calcified granuloma measuring six millimeters is present in the right upper lung, which has not changed in size.  She is experiencing balance issues and has fallen recently, resulting in bruises, particularly on her left arm. She uses a walker for mobility and has undergone extensive testing to determine the cause of her balance problems. She is scheduled  for a brain scan with neurology on April 1st to further investigate these issues.  Her kidney function was partially compromised with a glomerular filtration rate of 47, as noted in her labs from December.  She is concerned about the cost of medications, noting that Breztri  was initially expensive, and is cautious about the cost of new medications.      08/31/2023- Interim hx  Discussed the use of AI scribe software for clinical note transcription with the patient, who gave verbal consent to proceed.  History of Present Illness April [name] is a 68 year old female with COPD and asthma who presents for follow-up after a recent hospitalization for COPD exacerbation.  She experienced breathing issues on July 17th, with oxygen  levels dropping to 82% on room air due to extreme heat. She was hospitalized from July 17th to July 23rd for an acute exacerbation of COPD and received treatment with steroids, bronchodilators, and oxygen . She was weaned off oxygen  and transitioned to oral prednisone  before discharge.  Currently, she uses Breztri , two puffs in the morning and two puffs in the evening, and has a rescue inhaler (albuterol ) for use every six hours as needed. Two weeks ago, she started using supplemental oxygen  at two liters, particularly at bedtime, as her oxygen  levels drop when she is active. Her oxygen  levels were at 92% without oxygen  during sleep.  During her hospitalization, an echocardiogram showed an ejection fraction of 50-55% with mild diastolic dysfunction. A CTA of the chest revealed emphysema but no pneumonia or pulmonary embolism. Her breathing test in 2020  indicated severe obstructive lung disease with an asthmatic component. She is currently in a general rehabilitation program, which includes some pulmonary rehabilitation exercises.  She reports occasional upper airway nasal congestion but not usually. Her eosinophil count in August was 200, and she has previously administered  self-injections, indicating comfort with potential future treatments.   Pulmonary testing:   12/07/2018 PFTs>> FEV1 0.93 (34%)/ Severe obstruction with significant BD response   Imaging: 12/14/22 LDCT >> Lung RADS 1, negative. Biapical pleuroparenchymal scarring. Centrilobular emphysema. 6mm calcified granuloma RUL    Allergies  Allergen Reactions   Sulfa Antibiotics Anaphylaxis, Hives and Dermatitis   Sulfonamide Derivatives Anaphylaxis, Dermatitis, Rash and Other (See Comments)    Reaction occurred when patient was a child. Throat closed, plus a rash.   Alendronate Sodium Other (See Comments)    GI upset   Dilaudid  [Hydromorphone  Hcl] Nausea And Vomiting   Molds & Smuts Itching and Other (See Comments)    Itchy eyes/runny nose   Pollen Extract Itching and Other (See Comments)    Itchy eyes/runny nose   Wound Dressing Adhesive Other (See Comments)    Redness, adhesive tapes. Needs PAPER TAPE.    Immunization History  Administered Date(s) Administered   Fluad Quad(high Dose 65+) 08/26/2018, 11/24/2019, 09/30/2020, 10/08/2021   Fluad Trivalent(High Dose 65+) 10/05/2022   H1N1 12/07/2007   Influenza Split 10/15/2010   Influenza, Seasonal, Injecte, Preservative Fre 09/26/2009   Influenza,inj,Quad PF,6+ Mos 10/05/2012, 10/24/2013, 10/12/2014, 11/12/2015, 10/16/2016, 10/14/2017   Influenza-Unspecified 11/10/2004, 10/20/2005, 10/25/2006, 10/18/2007, 10/09/2008, 08/26/2018   PFIZER Comirnaty(Gray Top)Covid-19 Tri-Sucrose Vaccine 07/30/2020   PFIZER(Purple Top)SARS-COV-2 Vaccination 03/05/2019, 04/04/2019, 10/27/2019, 07/30/2020   Pneumococcal Conjugate-13 10/26/2018   Pneumococcal Polysaccharide-23 11/10/2004, 01/05/2009, 09/26/2009, 11/24/2019   Tdap 10/05/2006, 04/06/2011, 07/21/2023   Zoster Recombinant(Shingrix) 08/20/2020, 09/05/2020, 01/05/2021, 01/23/2021    Past Medical History:  Diagnosis Date   Allergy    Anemia    Anxiety    Anxiety and depression    related to  caring for mother during terminal illness   Arthritis    Asthma    AVN (avascular necrosis of bone) (HCC)    hip and wrist   Bronchitis    hx of   Cataract    Chronic kidney disease    COPD (chronic obstructive pulmonary disease) (HCC)    Depression    Emphysema of lung (HCC)    Endometriosis    FH: CAD (coronary artery disease)    GERD (gastroesophageal reflux disease)    ocassional   History of blood transfusion    after hip repackment - broke out in hives and started itching   History of palpitations    Hyperlipidemia    Hypertension    Hypothyroid    Neuromuscular disorder (HCC)    Non-ischemic cardiomyopathy (HCC) 2019   Osteopenia    forteo through Dr. Dolphus (started 10/12)   Osteoporosis    PMR (polymyalgia rheumatica) (HCC)    SIRS (systemic inflammatory response syndrome) (HCC) 10/2017   Smoker    SOB (shortness of breath) on exertion    Squamous cell carcinoma    facial, 2016   Temporal arteritis (HCC)    s/p prednisone  taper    Tobacco History: Social History   Tobacco Use  Smoking Status Former   Current packs/day: 0.00   Average packs/day: 0.3 packs/day for 34.0 years (11.2 ttl pk-yrs)   Types: Cigarettes   Start date: 10/25/1983   Quit date: 10/24/2017   Years since quitting: 5.8   Passive exposure: Past (dad  smoked)  Smokeless Tobacco Never  Tobacco Comments   quit smoking october 2019   Counseling given: Not Answered Tobacco comments: quit smoking october 2019   Outpatient Medications Prior to Visit  Medication Sig Dispense Refill   acetaminophen  (TYLENOL ) 650 MG CR tablet Take 1,300 mg by mouth at bedtime as needed for pain.     albuterol  (VENTOLIN  HFA) 108 (90 Base) MCG/ACT inhaler Inhale 2 puffs into the lungs every 6 (six) hours as needed for wheezing or shortness of breath.     azithromycin  (ZITHROMAX ) 250 MG tablet Take 1 tablet (250 mg total) by mouth every Monday, Wednesday, and Friday. 36 tablet 3    budeson-glycopyrrolate -formoterol  (BREZTRI  AEROSPHERE) 160-9-4.8 MCG/ACT AERO Inhale 2 puffs into the lungs every 12 (twelve) hours. 32.1 g 3   buPROPion  (WELLBUTRIN  XL) 300 MG 24 hr tablet TAKE 1 TABLET BY MOUTH DAILY (Patient taking differently: Take 300 mg by mouth in the morning.) 90 tablet 3   Calcium  500 MG CHEW Chew 500 mg by mouth 2 (two) times daily.     carbidopa -levodopa  (SINEMET  IR) 25-100 MG tablet TAKE 1 TABLET BY MOUTH 3 TIMES A DAY (Patient taking differently: Take 1 tablet by mouth See admin instructions. Take 1 tablet by mouth at 8 AM, 12 NOON, and 5 PM) 270 tablet 1   Cholecalciferol  (VITAMIN D -3) 125 MCG (5000 UT) TABS Take 5,000 Units by mouth daily.     Cyanocobalamin  (VITAMIN B 12 PO) Take 1 capsule by mouth daily.     feeding supplement (ENSURE PLUS HIGH PROTEIN) LIQD Take 237 mLs by mouth 2 (two) times daily between meals.     furosemide  (LASIX ) 20 MG tablet TAKE 1 TABLET BY MOUTH DAILY AS NEEDED FOR EDEMA (Patient taking differently: Take 20 mg by mouth daily as needed for edema.) 90 tablet 1   levothyroxine  (SYNTHROID ) 88 MCG tablet Take 88 mcg by mouth daily before breakfast.     melatonin 5 MG TABS Take 1 tablet (5 mg total) by mouth at bedtime.     methocarbamol  (ROBAXIN ) 750 MG tablet Take 750 mg by mouth 2 (two) times daily as needed for muscle spasms (if not takingTizanidine).     metoprolol  succinate (TOPROL -XL) 25 MG 24 hr tablet Take 1 tablet (25 mg total) by mouth daily. (Patient taking differently: Take 25 mg by mouth at bedtime.) 90 tablet 3   omeprazole  (PRILOSEC) 20 MG capsule TAKE 1 CAPSULE BY MOUTH EVERY DAY (Patient taking differently: Take 20 mg by mouth at bedtime.) 90 capsule 3   oxyCODONE  (OXY IR/ROXICODONE ) 5 MG immediate release tablet Take 1 tablet (5 mg total) by mouth every 4 (four) hours as needed for severe pain (pain score 7-10). 20 tablet 0   polyethylene glycol (MIRALAX  / GLYCOLAX ) 17 g packet Take 17 g by mouth daily as needed for moderate  constipation.     potassium chloride  (KLOR-CON  M) 10 MEQ tablet Take 1 tablet (10 mEq total) by mouth daily as needed (when you take the lasix ). (Patient taking differently: Take 10 mEq by mouth See admin instructions. Take 10 mEq by mouth once a day when taking Furosemide ) 30 tablet 3   rosuvastatin  (CRESTOR ) 10 MG tablet Take 1 tablet (10 mg total) by mouth at bedtime. 90 tablet 3   sertraline  (ZOLOFT ) 50 MG tablet Take 50 mg by mouth at bedtime.     SYSTANE ULTRA 0.4-0.3 % SOLN Place 1 drop into both eyes 2 (two) times daily.     tiZANidine  (ZANAFLEX ) 4  MG tablet Take 4 mg by mouth every 6 (six) hours as needed for muscle spasms (if not taking Methocarbamol ).     valsartan  (DIOVAN ) 40 MG tablet Take 40 mg by mouth at bedtime.     No facility-administered medications prior to visit.    Review of Systems  Review of Systems  Respiratory:  Positive for wheezing.    Physical Exam  There were no vitals taken for this visit. Physical Exam Constitutional:      Appearance: Normal appearance.  HENT:     Head: Normocephalic and atraumatic.  Cardiovascular:     Rate and Rhythm: Normal rate and regular rhythm.  Pulmonary:     Breath sounds: Wheezing present.  Musculoskeletal:        General: Normal range of motion.  Skin:    General: Skin is warm and dry.  Neurological:     General: No focal deficit present.     Mental Status: She is alert and oriented to person, place, and time. Mental status is at baseline.  Psychiatric:        Mood and Affect: Mood normal.        Behavior: Behavior normal.        Thought Content: Thought content normal.        Judgment: Judgment normal.      Lab Results:  CBC    Component Value Date/Time   WBC 5.1 08/18/2023 0334   RBC 3.37 (L) 08/18/2023 0334   HGB 10.5 (L) 08/18/2023 0334   HCT 33.8 (L) 08/18/2023 0334   PLT 189 08/18/2023 0334   MCV 100.3 (H) 08/18/2023 0334   MCH 31.2 08/18/2023 0334   MCHC 31.1 08/18/2023 0334   RDW 13.8  08/18/2023 0334   LYMPHSABS 0.5 (L) 08/16/2023 1057   MONOABS 0.6 08/16/2023 1057   EOSABS 0.2 08/16/2023 1057   BASOSABS 0.0 08/16/2023 1057    BMET    Component Value Date/Time   NA 133 (L) 08/19/2023 0328   NA 138 01/31/2021 1029   K 4.0 08/19/2023 0328   CL 99 08/19/2023 0328   CO2 26 08/19/2023 0328   GLUCOSE 103 (H) 08/19/2023 0328   BUN 14 08/19/2023 0328   BUN 15 01/31/2021 1029   CREATININE 0.94 08/19/2023 0328   CREATININE 1.13 (H) 06/03/2016 1213   CALCIUM  8.8 (L) 08/19/2023 0328   GFRNONAA >60 08/19/2023 0328   GFRNONAA 52 (L) 06/03/2016 1213   GFRAA 50 (L) 06/13/2019 0955   GFRAA 60 06/03/2016 1213    BNP    Component Value Date/Time   BNP 68.4 07/24/2023 0637    ProBNP    Component Value Date/Time   PROBNP 198.0 07/22/2023 1811   PROBNP 35.0 08/25/2019 0815    Imaging: CT PELVIS WO CONTRAST Result Date: 08/16/2023 EXAM: CT PELVIS, WITHOUT IV CONTRAST 08/16/2023 04:08:31 PM TECHNIQUE: Axial images were acquired through the pelvis without IV contrast. Reformatted images were reviewed. Automated exposure control, iterative reconstruction, and/or weight based adjustment of the mA/kV was utilized to reduce the radiation dose to as low as reasonably achievable. COMPARISON: MRI of earlier today. CLINICAL HISTORY: Sacral fracture. Per triage notes: Per [facility] pt coming from [facility] independent living c/o lower back pain radiating down into legs x 2 weeks. Patient on 4 L [place] at baseline. FINDINGS: BONES: Osteopenia. Minimally displaced fractures in the sagittal plane within both sides of the sacrum, most typical of sacral insufficiency fractures (example image 35 of series 11). Fractures likely extend to the bilateral  sacroiliac joint, without displaced components. No sacroiliac joint widening. JOINTS: No dislocation. The joint spaces are normal. No sacroiliac joint widening. SOFT TISSUES: Moderate pelvic floor laxity. INTRAPELVIC CONTENTS: Hysterectomy. Contrast  within the urinary bladder, presumably from today's MRI. Colonic diverticulosis. Large amount of stool in the cecum. ARTIFACT: Beam hardening artifact from bilateral hip arthroplasty. IMPRESSION: 1. Minimally displaced bilateral sacral insufficiency fractures. Electronically signed by: Rockey Kilts MD 08/16/2023 05:23 PM EDT RP Workstation: [id number]   MR Lumbar Spine W Wo Contrast Result Date: 08/16/2023 EXAM: MRI LUMBAR SPINE 08/16/2023 12:45:13 PM TECHNIQUE: Multiplanar multisequence MRI of the lumbar spine was performed with and without the administration of 7.5 mL of intravenous gadobutrol  (GADAVIST ). COMPARISON: None available. CLINICAL HISTORY: Low back pain, cauda equina syndrome suspected. FINDINGS: BONES AND ALIGNMENT: Fusion across the T12-L1 disc space. Hyperintense T2-weighted signal and abnormal contrast enhancement within the S2 body of the sacrum incompletely visualized. SPINAL CORD: The conus terminates normally. SOFT TISSUES: No paraspinal mass. L1-L2: No significant disc herniation. No spinal canal stenosis or neural foraminal narrowing. L2-L3: No significant disc herniation. No spinal canal stenosis or neural foraminal narrowing. L3-L4: Minimal disc bulge without stenosis. L4-L5: Small disc bulge and mild facet hypertrophy with narrowing at the lateral recesses. No central spinal canal stenosis or neural impingement. L5-S1: No significant disc herniation. No spinal canal stenosis or neural foraminal narrowing. IMPRESSION: 1. No cauda equina compression. 2. Hyperintense T2-weighted signal and abnormal contrast enhancement within the S2 body of the sacrum, incompletely visualized. This may indicate a sacral insufficiency fracture. If there is any history of malignancy, metastatic disease is also a possibility. 3. L4-5 small disc bulge and mild facet hypertrophy with narrowing at the lateral recesses, no central spinal canal stenosis or neural impingement. Electronically signed by: Franky Stanford  MD 08/16/2023 01:56 PM EDT RP Workstation: [id number]     Assessment & Plan:   1. Severe persistent asthma with exacerbation (Primary) - CBC w/Diff - IgE  2. Chronic obstructive pulmonary disease, unspecified COPD type (HCC) - CBC w/Diff - IgE  Assessment and Plan Assessment & Plan Chronic obstructive pulmonary disease with acute exacerbation and asthma with frequent exacerbations Experienced an acute exacerbation of COPD on July 17th, characterized by shortness of breath and hypoxia with an oxygen  saturation of 82% on room air. Hospitalized for eight days and treated with steroids, bronchodilators, and oxygen , leading to improvement. Transitioned to oral prednisone  and continues to use Breztri . Echocardiogram showed mild diastolic dysfunction, but BNP was normal, indicating no heart failure. Troponin and D-dimer were elevated, but CTA showed emphysema without pneumonia or pulmonary embolism. Last breathing test in 2020 showed severe obstructive lung disease with an asthmatic component. Currently on oxygen  therapy at 2 liters, especially at night. - Explore biologic therapy with Nucala  due to frequent exacerbations and hospitalization history. Discussed that Nucala  is an add-on treatment for asthma, targeting inflammation at a cellular level, and should be covered by insurance. - Order CBC with differential and IgE - Schedule breathing test - Initiate insurance approval process for Nucala  - Refer to pulmonary rehab - Prescribe prednisone  for wheezing - Continue Breztri , two puffs in the morning and evening - Continue albuterol , two puffs every six hours as needed - Continue oxygen  therapy, especially at night   Almarie LELON Ferrari, NP 08/30/2023

## 2023-08-31 ENCOUNTER — Telehealth: Payer: Self-pay | Admitting: Primary Care

## 2023-08-31 ENCOUNTER — Encounter (HOSPITAL_BASED_OUTPATIENT_CLINIC_OR_DEPARTMENT_OTHER): Payer: Self-pay | Admitting: Primary Care

## 2023-08-31 ENCOUNTER — Ambulatory Visit (HOSPITAL_BASED_OUTPATIENT_CLINIC_OR_DEPARTMENT_OTHER): Admitting: Primary Care

## 2023-08-31 VITALS — BP 130/74 | HR 94 | Ht 66.0 in | Wt 162.0 lb

## 2023-08-31 DIAGNOSIS — J4551 Severe persistent asthma with (acute) exacerbation: Secondary | ICD-10-CM | POA: Diagnosis not present

## 2023-08-31 DIAGNOSIS — Z87891 Personal history of nicotine dependence: Secondary | ICD-10-CM

## 2023-08-31 DIAGNOSIS — J449 Chronic obstructive pulmonary disease, unspecified: Secondary | ICD-10-CM | POA: Diagnosis not present

## 2023-08-31 MED ORDER — PREDNISONE 10 MG PO TABS
ORAL_TABLET | ORAL | 0 refills | Status: AC
Start: 2023-08-31 — End: 2023-09-12

## 2023-08-31 NOTE — Patient Instructions (Addendum)
  VISIT SUMMARY: You had a follow-up appointment today after your recent hospitalization for a COPD exacerbation. We discussed your current medications, oxygen  therapy, and potential new treatments to help manage your COPD and asthma. We also reviewed your recent test results and your ongoing rehabilitation program.  YOUR PLAN: -CHRONIC OBSTRUCTIVE PULMONARY DISEASE (COPD) WITH ACUTE EXACERBATION AND ASTHMA: COPD is a chronic lung disease that makes it hard to breathe, and an exacerbation is a sudden worsening of symptoms. Asthma is a condition where your airways narrow and swell, making it difficult to breathe. You experienced a severe exacerbation recently, leading to hospitalization. We discussed starting a new treatment called Nucala, which is used as an add on treatment for severe persistent / allergic asthma. We will also continue your current medications: Breztri  (two puffs in the morning and evening), albuterol  (two puffs every six hours as needed), and oxygen  therapy at night. Additionally, we will order some blood tests, schedule a breathing test, and refer you to pulmonary rehab. We will also start the process to get insurance approval for Nucala.  INSTRUCTIONS: Please follow up with the blood tests (CBC with differential and IgE) and the breathing test as scheduled. Continue your current medications and oxygen  therapy as discussed. We will also start the insurance approval process for Nucala and refer you to pulmonary rehab. If you experience any worsening of symptoms or new issues, please contact our office.  Follow-up 8-12 weeks with Landry NP at [address] location

## 2023-08-31 NOTE — Telephone Encounter (Signed)
 Starting process for Nucala approval, COPD and allergic asthma with recent hospitalization and recurrent exacerbations   We will fax your the paper work

## 2023-09-01 ENCOUNTER — Other Ambulatory Visit (HOSPITAL_COMMUNITY): Payer: Self-pay

## 2023-09-01 ENCOUNTER — Telehealth: Payer: Self-pay

## 2023-09-01 DIAGNOSIS — M6281 Muscle weakness (generalized): Secondary | ICD-10-CM | POA: Diagnosis not present

## 2023-09-01 DIAGNOSIS — E039 Hypothyroidism, unspecified: Secondary | ICD-10-CM | POA: Diagnosis not present

## 2023-09-01 DIAGNOSIS — S329XXD Fracture of unspecified parts of lumbosacral spine and pelvis, subsequent encounter for fracture with routine healing: Secondary | ICD-10-CM | POA: Diagnosis not present

## 2023-09-01 DIAGNOSIS — M353 Polymyalgia rheumatica: Secondary | ICD-10-CM | POA: Diagnosis not present

## 2023-09-01 DIAGNOSIS — G20B2 Parkinson's disease with dyskinesia, with fluctuations: Secondary | ICD-10-CM | POA: Diagnosis not present

## 2023-09-01 DIAGNOSIS — I5030 Unspecified diastolic (congestive) heart failure: Secondary | ICD-10-CM | POA: Diagnosis not present

## 2023-09-01 DIAGNOSIS — J449 Chronic obstructive pulmonary disease, unspecified: Secondary | ICD-10-CM | POA: Diagnosis not present

## 2023-09-01 DIAGNOSIS — J4551 Severe persistent asthma with (acute) exacerbation: Secondary | ICD-10-CM

## 2023-09-01 NOTE — Telephone Encounter (Signed)
 Received notification from [employer] regarding a prior authorization for NUCALA. Authorization has been APPROVED from 08/31/24 to 03/03/24. Have not yet received approval letter but will send to scan center once received.  Per test claim, copay for 30 days supply is $0  Authorization # [id number] Phone # [phone]

## 2023-09-01 NOTE — Telephone Encounter (Signed)
 Submitted a Prior Authorization request to [employer] for NUCALA via CoverMyMeds. Will update once we receive a response.  Key: [id number]

## 2023-09-02 DIAGNOSIS — M353 Polymyalgia rheumatica: Secondary | ICD-10-CM | POA: Diagnosis not present

## 2023-09-02 DIAGNOSIS — J449 Chronic obstructive pulmonary disease, unspecified: Secondary | ICD-10-CM | POA: Diagnosis not present

## 2023-09-02 DIAGNOSIS — G20B2 Parkinson's disease with dyskinesia, with fluctuations: Secondary | ICD-10-CM | POA: Diagnosis not present

## 2023-09-02 DIAGNOSIS — I5030 Unspecified diastolic (congestive) heart failure: Secondary | ICD-10-CM | POA: Diagnosis not present

## 2023-09-02 DIAGNOSIS — M6281 Muscle weakness (generalized): Secondary | ICD-10-CM | POA: Diagnosis not present

## 2023-09-02 DIAGNOSIS — E039 Hypothyroidism, unspecified: Secondary | ICD-10-CM | POA: Diagnosis not present

## 2023-09-02 DIAGNOSIS — S329XXD Fracture of unspecified parts of lumbosacral spine and pelvis, subsequent encounter for fracture with routine healing: Secondary | ICD-10-CM | POA: Diagnosis not present

## 2023-09-03 ENCOUNTER — Ambulatory Visit: Payer: Self-pay | Admitting: Primary Care

## 2023-09-03 DIAGNOSIS — J449 Chronic obstructive pulmonary disease, unspecified: Secondary | ICD-10-CM | POA: Diagnosis not present

## 2023-09-03 DIAGNOSIS — E039 Hypothyroidism, unspecified: Secondary | ICD-10-CM | POA: Diagnosis not present

## 2023-09-03 DIAGNOSIS — I110 Hypertensive heart disease with heart failure: Secondary | ICD-10-CM | POA: Diagnosis not present

## 2023-09-03 DIAGNOSIS — E782 Mixed hyperlipidemia: Secondary | ICD-10-CM | POA: Diagnosis not present

## 2023-09-03 LAB — CBC WITH DIFFERENTIAL/PLATELET
Basophils Absolute: 0 x10E3/uL (ref 0.0–0.2)
Basos: 0 %
EOS (ABSOLUTE): 0.1 x10E3/uL (ref 0.0–0.4)
Eos: 0 %
Hematocrit: 35.9 % (ref 34.0–46.6)
Hemoglobin: 11.5 g/dL (ref 11.1–15.9)
Immature Grans (Abs): 0.1 x10E3/uL (ref 0.0–0.1)
Immature Granulocytes: 1 %
Lymphocytes Absolute: 0.5 x10E3/uL — ABNORMAL LOW (ref 0.7–3.1)
Lymphs: 4 %
MCH: 31.3 pg (ref 26.6–33.0)
MCHC: 32 g/dL (ref 31.5–35.7)
MCV: 98 fL — ABNORMAL HIGH (ref 79–97)
Monocytes Absolute: 0.6 x10E3/uL (ref 0.1–0.9)
Monocytes: 4 %
Neutrophils Absolute: 11.6 x10E3/uL — ABNORMAL HIGH (ref 1.4–7.0)
Neutrophils: 91 %
Platelets: 262 x10E3/uL (ref 150–450)
RBC: 3.67 x10E6/uL — ABNORMAL LOW (ref 3.77–5.28)
RDW: 13.3 % (ref 11.7–15.4)
WBC: 12.9 x10E3/uL — ABNORMAL HIGH (ref 3.4–10.8)

## 2023-09-03 LAB — IGE: IgE (Immunoglobulin E), Serum: 13 [IU]/mL (ref 6–495)

## 2023-09-03 NOTE — Progress Notes (Signed)
Tried calling the pt and there was no answer- LMTCB.  

## 2023-09-03 NOTE — Progress Notes (Signed)
 Labs indicated that she may have an infection I sent in prednisone  for her, can you ask if she has any acute bronchitis symptoms, N/V/D or urinary symptoms?

## 2023-09-06 DIAGNOSIS — E039 Hypothyroidism, unspecified: Secondary | ICD-10-CM | POA: Diagnosis not present

## 2023-09-06 DIAGNOSIS — S329XXD Fracture of unspecified parts of lumbosacral spine and pelvis, subsequent encounter for fracture with routine healing: Secondary | ICD-10-CM | POA: Diagnosis not present

## 2023-09-06 DIAGNOSIS — M353 Polymyalgia rheumatica: Secondary | ICD-10-CM | POA: Diagnosis not present

## 2023-09-06 DIAGNOSIS — G20B2 Parkinson's disease with dyskinesia, with fluctuations: Secondary | ICD-10-CM | POA: Diagnosis not present

## 2023-09-06 DIAGNOSIS — I5030 Unspecified diastolic (congestive) heart failure: Secondary | ICD-10-CM | POA: Diagnosis not present

## 2023-09-06 DIAGNOSIS — M6281 Muscle weakness (generalized): Secondary | ICD-10-CM | POA: Diagnosis not present

## 2023-09-06 DIAGNOSIS — J449 Chronic obstructive pulmonary disease, unspecified: Secondary | ICD-10-CM | POA: Diagnosis not present

## 2023-09-07 NOTE — Progress Notes (Signed)
Left pt message to return call 

## 2023-09-07 NOTE — Telephone Encounter (Signed)
 ATC patient to discuss new start Nucala scheduling. Unable to reach. Left HIPAA compliant VM at [phone].   Aleck Puls, PharmD, BCPS Clinical Pharmacist  [facility]

## 2023-09-08 DIAGNOSIS — E039 Hypothyroidism, unspecified: Secondary | ICD-10-CM | POA: Diagnosis not present

## 2023-09-08 DIAGNOSIS — R296 Repeated falls: Secondary | ICD-10-CM | POA: Diagnosis not present

## 2023-09-08 DIAGNOSIS — N183 Chronic kidney disease, stage 3 unspecified: Secondary | ICD-10-CM | POA: Diagnosis not present

## 2023-09-08 DIAGNOSIS — I5032 Chronic diastolic (congestive) heart failure: Secondary | ICD-10-CM | POA: Diagnosis not present

## 2023-09-08 DIAGNOSIS — M8448XA Pathological fracture, other site, initial encounter for fracture: Secondary | ICD-10-CM | POA: Diagnosis not present

## 2023-09-08 DIAGNOSIS — I10 Essential (primary) hypertension: Secondary | ICD-10-CM | POA: Diagnosis not present

## 2023-09-08 DIAGNOSIS — M353 Polymyalgia rheumatica: Secondary | ICD-10-CM | POA: Diagnosis not present

## 2023-09-08 DIAGNOSIS — I428 Other cardiomyopathies: Secondary | ICD-10-CM | POA: Diagnosis not present

## 2023-09-09 DIAGNOSIS — S329XXD Fracture of unspecified parts of lumbosacral spine and pelvis, subsequent encounter for fracture with routine healing: Secondary | ICD-10-CM | POA: Diagnosis not present

## 2023-09-09 DIAGNOSIS — M6281 Muscle weakness (generalized): Secondary | ICD-10-CM | POA: Diagnosis not present

## 2023-09-09 DIAGNOSIS — I5030 Unspecified diastolic (congestive) heart failure: Secondary | ICD-10-CM | POA: Diagnosis not present

## 2023-09-09 DIAGNOSIS — R2689 Other abnormalities of gait and mobility: Secondary | ICD-10-CM | POA: Diagnosis not present

## 2023-09-09 DIAGNOSIS — S3219XD Other fracture of sacrum, subsequent encounter for fracture with routine healing: Secondary | ICD-10-CM | POA: Diagnosis not present

## 2023-09-09 DIAGNOSIS — J449 Chronic obstructive pulmonary disease, unspecified: Secondary | ICD-10-CM | POA: Diagnosis not present

## 2023-09-09 DIAGNOSIS — M353 Polymyalgia rheumatica: Secondary | ICD-10-CM | POA: Diagnosis not present

## 2023-09-09 DIAGNOSIS — E039 Hypothyroidism, unspecified: Secondary | ICD-10-CM | POA: Diagnosis not present

## 2023-09-09 DIAGNOSIS — Z9181 History of falling: Secondary | ICD-10-CM | POA: Diagnosis not present

## 2023-09-09 DIAGNOSIS — G20B2 Parkinson's disease with dyskinesia, with fluctuations: Secondary | ICD-10-CM | POA: Diagnosis not present

## 2023-09-09 NOTE — Telephone Encounter (Signed)
 Copied from CRM [id number]. Topic: Appointments - Scheduling Inquiry for Clinic >> Sep 09, 2023 11:40 AM Benton KIDD wrote: Reason for CRM: patient is calling because she say someone was trying to schedule a appointment with her or for her concerning some medication . Patient is in rehab at the moment and will not be able to make it  Patient is wanting the appointment in the morning time  Please give patient a call back cause she is all over the place with information  [phone]

## 2023-09-09 NOTE — Telephone Encounter (Signed)
 ATC patient, second attempt, for new start Nucala. LVM with contact number to return call.   April [name], PharmD, BCPS Clinical Pharmacist  [facility]

## 2023-09-09 NOTE — Progress Notes (Signed)
 I called and spoke to pt. Pt has been scheduled for 10-06-23 with Almarie ferrari, NP. Pt states she is getting out of rehab tomorrow and wanted an appointment a month out in the morning. NFN

## 2023-09-10 DIAGNOSIS — M353 Polymyalgia rheumatica: Secondary | ICD-10-CM | POA: Diagnosis not present

## 2023-09-10 DIAGNOSIS — I5030 Unspecified diastolic (congestive) heart failure: Secondary | ICD-10-CM | POA: Diagnosis not present

## 2023-09-10 DIAGNOSIS — J449 Chronic obstructive pulmonary disease, unspecified: Secondary | ICD-10-CM | POA: Diagnosis not present

## 2023-09-10 DIAGNOSIS — E039 Hypothyroidism, unspecified: Secondary | ICD-10-CM | POA: Diagnosis not present

## 2023-09-10 DIAGNOSIS — M6281 Muscle weakness (generalized): Secondary | ICD-10-CM | POA: Diagnosis not present

## 2023-09-10 DIAGNOSIS — G20B2 Parkinson's disease with dyskinesia, with fluctuations: Secondary | ICD-10-CM | POA: Diagnosis not present

## 2023-09-10 DIAGNOSIS — S329XXD Fracture of unspecified parts of lumbosacral spine and pelvis, subsequent encounter for fracture with routine healing: Secondary | ICD-10-CM | POA: Diagnosis not present

## 2023-09-12 DIAGNOSIS — R2681 Unsteadiness on feet: Secondary | ICD-10-CM | POA: Diagnosis not present

## 2023-09-13 DIAGNOSIS — M353 Polymyalgia rheumatica: Secondary | ICD-10-CM | POA: Diagnosis not present

## 2023-09-13 DIAGNOSIS — E039 Hypothyroidism, unspecified: Secondary | ICD-10-CM | POA: Diagnosis not present

## 2023-09-13 DIAGNOSIS — S3219XD Other fracture of sacrum, subsequent encounter for fracture with routine healing: Secondary | ICD-10-CM | POA: Diagnosis not present

## 2023-09-13 DIAGNOSIS — R2689 Other abnormalities of gait and mobility: Secondary | ICD-10-CM | POA: Diagnosis not present

## 2023-09-13 DIAGNOSIS — Z9181 History of falling: Secondary | ICD-10-CM | POA: Diagnosis not present

## 2023-09-13 DIAGNOSIS — G20B2 Parkinson's disease with dyskinesia, with fluctuations: Secondary | ICD-10-CM | POA: Diagnosis not present

## 2023-09-13 DIAGNOSIS — S329XXD Fracture of unspecified parts of lumbosacral spine and pelvis, subsequent encounter for fracture with routine healing: Secondary | ICD-10-CM | POA: Diagnosis not present

## 2023-09-13 DIAGNOSIS — M6281 Muscle weakness (generalized): Secondary | ICD-10-CM | POA: Diagnosis not present

## 2023-09-13 DIAGNOSIS — J449 Chronic obstructive pulmonary disease, unspecified: Secondary | ICD-10-CM | POA: Diagnosis not present

## 2023-09-13 DIAGNOSIS — I5030 Unspecified diastolic (congestive) heart failure: Secondary | ICD-10-CM | POA: Diagnosis not present

## 2023-09-13 NOTE — Addendum Note (Signed)
 Addended by: CLAUDENE NEVINS A on: 09/13/2023 11:59 AM   Modules accepted: Orders

## 2023-09-14 ENCOUNTER — Telehealth (HOSPITAL_COMMUNITY): Payer: Self-pay

## 2023-09-14 ENCOUNTER — Encounter (HOSPITAL_COMMUNITY): Payer: Self-pay
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
## 2023-09-14 NOTE — Telephone Encounter (Signed)
Attempted to call patient in regards to Pulmonary Rehab - LM on VM   Sent letter 

## 2023-09-15 DIAGNOSIS — I5030 Unspecified diastolic (congestive) heart failure: Secondary | ICD-10-CM | POA: Diagnosis not present

## 2023-09-15 DIAGNOSIS — M353 Polymyalgia rheumatica: Secondary | ICD-10-CM | POA: Diagnosis not present

## 2023-09-15 DIAGNOSIS — G20B2 Parkinson's disease with dyskinesia, with fluctuations: Secondary | ICD-10-CM | POA: Diagnosis not present

## 2023-09-15 DIAGNOSIS — S329XXD Fracture of unspecified parts of lumbosacral spine and pelvis, subsequent encounter for fracture with routine healing: Secondary | ICD-10-CM | POA: Diagnosis not present

## 2023-09-15 DIAGNOSIS — J449 Chronic obstructive pulmonary disease, unspecified: Secondary | ICD-10-CM | POA: Diagnosis not present

## 2023-09-15 DIAGNOSIS — E039 Hypothyroidism, unspecified: Secondary | ICD-10-CM | POA: Diagnosis not present

## 2023-09-15 DIAGNOSIS — M6281 Muscle weakness (generalized): Secondary | ICD-10-CM | POA: Diagnosis not present

## 2023-09-16 ENCOUNTER — Telehealth: Payer: Self-pay

## 2023-09-16 ENCOUNTER — Other Ambulatory Visit: Payer: Self-pay

## 2023-09-16 DIAGNOSIS — J449 Chronic obstructive pulmonary disease, unspecified: Secondary | ICD-10-CM | POA: Diagnosis not present

## 2023-09-16 DIAGNOSIS — E039 Hypothyroidism, unspecified: Secondary | ICD-10-CM | POA: Diagnosis not present

## 2023-09-16 DIAGNOSIS — S3219XD Other fracture of sacrum, subsequent encounter for fracture with routine healing: Secondary | ICD-10-CM | POA: Diagnosis not present

## 2023-09-16 DIAGNOSIS — R2689 Other abnormalities of gait and mobility: Secondary | ICD-10-CM | POA: Diagnosis not present

## 2023-09-16 DIAGNOSIS — M6281 Muscle weakness (generalized): Secondary | ICD-10-CM | POA: Diagnosis not present

## 2023-09-16 DIAGNOSIS — G20B2 Parkinson's disease with dyskinesia, with fluctuations: Secondary | ICD-10-CM | POA: Diagnosis not present

## 2023-09-16 DIAGNOSIS — I5030 Unspecified diastolic (congestive) heart failure: Secondary | ICD-10-CM | POA: Diagnosis not present

## 2023-09-16 DIAGNOSIS — M353 Polymyalgia rheumatica: Secondary | ICD-10-CM | POA: Diagnosis not present

## 2023-09-16 DIAGNOSIS — Z9181 History of falling: Secondary | ICD-10-CM | POA: Diagnosis not present

## 2023-09-16 DIAGNOSIS — S329XXD Fracture of unspecified parts of lumbosacral spine and pelvis, subsequent encounter for fracture with routine healing: Secondary | ICD-10-CM | POA: Diagnosis not present

## 2023-09-16 MED ORDER — NUCALA 100 MG/ML ~~LOC~~ SOAJ
SUBCUTANEOUS | 0 refills | Status: DC
  Filled 2023-09-17: qty 1, 28d supply, fill #0

## 2023-09-16 NOTE — Telephone Encounter (Signed)
 Patient returned call. She has been in rehab. Scheduled for new start Nucala  in office at [facility] 09/23/23. She is aware to look out for call from [place] or [place].   Aleck Puls, PharmD, BCPS Clinical Pharmacist  [facility]

## 2023-09-16 NOTE — Telephone Encounter (Signed)
 Third attempt to reach patient. Left VM with contact number to return call.   Aleck Puls, PharmD, BCPS Clinical Pharmacist  [facility]

## 2023-09-16 NOTE — Telephone Encounter (Signed)
 Copied from CRM #[id number]. Topic: General - Other >> Sep 16, 2023  2:40 PM Abigail D wrote: Reason for CRM: Patient returning call from [place] in the clinic, spoke with CAL and she was not available at the moment. Please call back when available.  Routing to [employer].

## 2023-09-17 ENCOUNTER — Other Ambulatory Visit: Payer: Self-pay

## 2023-09-17 DIAGNOSIS — J449 Chronic obstructive pulmonary disease, unspecified: Secondary | ICD-10-CM | POA: Diagnosis not present

## 2023-09-17 DIAGNOSIS — E039 Hypothyroidism, unspecified: Secondary | ICD-10-CM | POA: Diagnosis not present

## 2023-09-17 DIAGNOSIS — M6281 Muscle weakness (generalized): Secondary | ICD-10-CM | POA: Diagnosis not present

## 2023-09-17 DIAGNOSIS — I499 Cardiac arrhythmia, unspecified: Secondary | ICD-10-CM | POA: Diagnosis not present

## 2023-09-17 DIAGNOSIS — W19XXXA Unspecified fall, initial encounter: Secondary | ICD-10-CM | POA: Diagnosis not present

## 2023-09-17 DIAGNOSIS — R062 Wheezing: Secondary | ICD-10-CM | POA: Diagnosis not present

## 2023-09-17 DIAGNOSIS — M353 Polymyalgia rheumatica: Secondary | ICD-10-CM | POA: Diagnosis not present

## 2023-09-17 DIAGNOSIS — G20B2 Parkinson's disease with dyskinesia, with fluctuations: Secondary | ICD-10-CM | POA: Diagnosis not present

## 2023-09-17 DIAGNOSIS — I5030 Unspecified diastolic (congestive) heart failure: Secondary | ICD-10-CM | POA: Diagnosis not present

## 2023-09-17 DIAGNOSIS — S329XXD Fracture of unspecified parts of lumbosacral spine and pelvis, subsequent encounter for fracture with routine healing: Secondary | ICD-10-CM | POA: Diagnosis not present

## 2023-09-17 NOTE — Progress Notes (Signed)
 Specialty Pharmacy Initial Fill Coordination Note  April [name] is a 68 y.o. female contacted today regarding initial fill of specialty medication(s) Mepolizumab  (Nucala )   Patient requested Courier to Provider Office   Delivery date: 09/21/23   Verified address: [address], [place], [address]   Medication will be filled on 9/15.   Patient is aware of $0 copayment.

## 2023-09-20 ENCOUNTER — Telehealth: Payer: Self-pay

## 2023-09-20 NOTE — Transitions of Care (Post Inpatient/ED Visit) (Unsigned)
   09/20/2023  Name: HILARI WETHINGTON MRN: [id number] DOB: 04/12/1955  Today's TOC FU Call Status: Today's TOC FU Call Status:: Unsuccessful Call (1st Attempt) Unsuccessful Call (1st Attempt) Date: 09/20/23  Attempted to reach the patient regarding the most recent Inpatient/ED visit.  Follow Up Plan: Additional outreach attempts will be made to reach the patient to complete the Transitions of Care (Post Inpatient/ED visit) call.   Signature Julian Lemmings, LPN [facility] Nurse Health Advisor Direct Dial [phone]

## 2023-09-21 NOTE — Transitions of Care (Post Inpatient/ED Visit) (Unsigned)
   09/21/2023  Name: April [name] MRN: [id number] DOB: 08-17-1955  Today's TOC FU Call Status: Today's TOC FU Call Status:: Unsuccessful Call (2nd Attempt) Unsuccessful Call (1st Attempt) Date: 09/20/23 Unsuccessful Call (2nd Attempt) Date: 09/21/23  Attempted to reach the patient regarding the most recent Inpatient/ED visit.  Follow Up Plan: Additional outreach attempts will be made to reach the patient to complete the Transitions of Care (Post Inpatient/ED visit) call.   Signature Julian Lemmings, LPN [facility] Nurse Health Advisor Direct Dial [phone]

## 2023-09-22 DIAGNOSIS — R2689 Other abnormalities of gait and mobility: Secondary | ICD-10-CM | POA: Diagnosis not present

## 2023-09-22 DIAGNOSIS — M6250 Muscle wasting and atrophy, not elsewhere classified, unspecified site: Secondary | ICD-10-CM | POA: Diagnosis not present

## 2023-09-22 NOTE — Transitions of Care (Post Inpatient/ED Visit) (Signed)
   09/22/2023  Name: April [name] MRN: [id number] DOB: 07-13-1955  Today's TOC FU Call Status: Today's TOC FU Call Status:: Unsuccessful Call (3rd Attempt) Unsuccessful Call (1st Attempt) Date: 09/20/23 Unsuccessful Call (2nd Attempt) Date: 09/21/23 Unsuccessful Call (3rd Attempt) Date: 09/22/23  Attempted to reach the patient regarding the most recent Inpatient/ED visit.  Follow Up Plan: Additional outreach attempts will be made to reach the patient to complete the Transitions of Care (Post Inpatient/ED visit) call.   Signature Julian Lemmings, LPN [facility] Nurse Health Advisor Direct Dial [phone]

## 2023-09-23 ENCOUNTER — Other Ambulatory Visit

## 2023-09-23 DIAGNOSIS — M62521 Muscle wasting and atrophy, not elsewhere classified, right upper arm: Secondary | ICD-10-CM | POA: Diagnosis not present

## 2023-09-23 DIAGNOSIS — M6250 Muscle wasting and atrophy, not elsewhere classified, unspecified site: Secondary | ICD-10-CM | POA: Diagnosis not present

## 2023-09-23 DIAGNOSIS — M62522 Muscle wasting and atrophy, not elsewhere classified, left upper arm: Secondary | ICD-10-CM | POA: Diagnosis not present

## 2023-09-23 DIAGNOSIS — R2681 Unsteadiness on feet: Secondary | ICD-10-CM | POA: Diagnosis not present

## 2023-09-23 DIAGNOSIS — R2689 Other abnormalities of gait and mobility: Secondary | ICD-10-CM | POA: Diagnosis not present

## 2023-09-23 DIAGNOSIS — R278 Other lack of coordination: Secondary | ICD-10-CM | POA: Diagnosis not present

## 2023-09-23 DIAGNOSIS — R296 Repeated falls: Secondary | ICD-10-CM | POA: Diagnosis not present

## 2023-09-23 NOTE — Progress Notes (Deleted)
 HPI Patient presents today to [facility] to see pharmacy team for Nucala  new start. She has COPD/asthma overlap with frequent exacerbations. Hospitalization for 8 days in July for exacerbation. Available prescription dispense history demonstrates 3 dispenses of prednisone  in the last 6 months.   Last OV with Almarie Ferrari was on 08/31/23 for follow-up after recent hospitalization for COPD exacerbation. Plan to add Nucala  to current treatment regimen.  Respiratory Medications Current regimen: Breztri  160-9-4.8 mcg/act (Inhale 2 puffs into the lungs every 12 hours),  Ventolin  108 mcg/act (Inhale 2 puffs into the lungs every 6 hours PRN wheezing, shortness of breath), azithromycin  250mg  tab (Take 1 tab by mouth every Mo/We/Fri)  Patient reports {Adherence challenges yes no:[id number]::adherence challenges,no known adherence challenges}  OBJECTIVE Allergies  Allergen Reactions   Sulfa Antibiotics Anaphylaxis, Hives and Dermatitis   Sulfonamide Derivatives Anaphylaxis, Dermatitis, Rash and Other (See Comments)    Reaction occurred when patient was a child. Throat closed, plus a rash.   Alendronate Sodium Other (See Comments)    GI upset   Dilaudid  [Hydromorphone  Hcl] Nausea And Vomiting   Molds & Smuts Itching and Other (See Comments)    Itchy eyes/runny nose   Pollen Extract Itching and Other (See Comments)    Itchy eyes/runny nose   Wound Dressing Adhesive Other (See Comments)    Redness, adhesive tapes. Needs PAPER TAPE.    Outpatient Encounter Medications as of 09/23/2023  Medication Sig Note   acetaminophen  (TYLENOL ) 650 MG CR tablet Take 1,300 mg by mouth at bedtime as needed for pain.    albuterol  (VENTOLIN  HFA) 108 (90 Base) MCG/ACT inhaler Inhale 2 puffs into the lungs every 6 (six) hours as needed for wheezing or shortness of breath.    azithromycin  (ZITHROMAX ) 250 MG tablet Take 1 tablet (250 mg total) by mouth every Monday, Wednesday, and Friday.     budeson-glycopyrrolate -formoterol  (BREZTRI  AEROSPHERE) 160-9-4.8 MCG/ACT AERO Inhale 2 puffs into the lungs every 12 (twelve) hours.    buPROPion  (WELLBUTRIN  XL) 300 MG 24 hr tablet TAKE 1 TABLET BY MOUTH DAILY (Patient taking differently: Take 300 mg by mouth in the morning.)    Calcium  500 MG CHEW Chew 500 mg by mouth 2 (two) times daily.    carbidopa -levodopa  (SINEMET  IR) 25-100 MG tablet TAKE 1 TABLET BY MOUTH 3 TIMES A DAY (Patient taking differently: Take 1 tablet by mouth See admin instructions. Take 1 tablet by mouth at 8 AM, 12 NOON, and 5 PM)    Cholecalciferol  (VITAMIN D -3) 125 MCG (5000 UT) TABS Take 5,000 Units by mouth daily.    Cyanocobalamin  (VITAMIN B 12 PO) Take 1 capsule by mouth daily. 08/16/2023: Strength not noted   feeding supplement (ENSURE PLUS HIGH PROTEIN) LIQD Take 237 mLs by mouth 2 (two) times daily between meals.    furosemide  (LASIX ) 20 MG tablet TAKE 1 TABLET BY MOUTH DAILY AS NEEDED FOR EDEMA (Patient taking differently: Take 20 mg by mouth daily as needed for edema.)    levothyroxine  (SYNTHROID ) 88 MCG tablet Take 88 mcg by mouth daily before breakfast.    melatonin 5 MG TABS Take 1 tablet (5 mg total) by mouth at bedtime.    Mepolizumab  (NUCALA ) 100 MG/ML SOAJ Inject contents of one pen (100mg ) in the skin on day 0 in clinic, and every 28 days thereafter. Courier to pulm: [address], [address], [place] [address]. Appt on 09/23/23.    methocarbamol  (ROBAXIN ) 750 MG tablet Take 750 mg by mouth 2 (two) times  daily as needed for muscle spasms (if not takingTizanidine).    metoprolol  succinate (TOPROL -XL) 25 MG 24 hr tablet Take 1 tablet (25 mg total) by mouth daily. (Patient taking differently: Take 25 mg by mouth at bedtime.)    omeprazole  (PRILOSEC) 20 MG capsule TAKE 1 CAPSULE BY MOUTH EVERY DAY (Patient taking differently: Take 20 mg by mouth at bedtime.)    oxyCODONE  (OXY IR/ROXICODONE ) 5 MG immediate release tablet Take 1 tablet (5 mg total) by mouth every 4  (four) hours as needed for severe pain (pain score 7-10).    polyethylene glycol (MIRALAX  / GLYCOLAX ) 17 g packet Take 17 g by mouth daily as needed for moderate constipation.    potassium chloride  (KLOR-CON  M) 10 MEQ tablet Take 1 tablet (10 mEq total) by mouth daily as needed (when you take the lasix ). (Patient taking differently: Take 10 mEq by mouth See admin instructions. Take 10 mEq by mouth once a day when taking Furosemide )    rosuvastatin  (CRESTOR ) 10 MG tablet Take 1 tablet (10 mg total) by mouth at bedtime.    sertraline  (ZOLOFT ) 50 MG tablet Take 50 mg by mouth at bedtime.    SYSTANE ULTRA 0.4-0.3 % SOLN Place 1 drop into both eyes 2 (two) times daily.    tiZANidine  (ZANAFLEX ) 4 MG tablet Take 4 mg by mouth every 6 (six) hours as needed for muscle spasms (if not taking Methocarbamol ).    valsartan  (DIOVAN ) 40 MG tablet Take 40 mg by mouth at bedtime. 08/16/2023: The patient is still taking this, she stated   No facility-administered encounter medications on file as of 09/23/2023.     Immunization History  Administered Date(s) Administered   Fluad Quad(high Dose 65+) 08/26/2018, 11/24/2019, 09/30/2020, 10/08/2021   Fluad Trivalent(High Dose 65+) 10/05/2022   H1N1 12/07/2007   Influenza Split 10/15/2010   Influenza, Seasonal, Injecte, Preservative Fre 09/26/2009   Influenza,inj,Quad PF,6+ Mos 10/05/2012, 10/24/2013, 10/12/2014, 11/12/2015, 10/16/2016, 10/14/2017   Influenza-Unspecified 11/10/2004, 10/20/2005, 10/25/2006, 10/18/2007, 10/09/2008, 08/26/2018   PFIZER Comirnaty(Gray Top)Covid-19 Tri-Sucrose Vaccine 07/30/2020   PFIZER(Purple Top)SARS-COV-2 Vaccination 03/05/2019, 04/04/2019, 10/27/2019, 07/30/2020   Pneumococcal Conjugate-13 10/26/2018   Pneumococcal Polysaccharide-23 11/10/2004, 01/05/2009, 09/26/2009, 11/24/2019   Tdap 10/05/2006, 04/06/2011, 07/21/2023   Zoster Recombinant(Shingrix) 08/20/2020, 09/05/2020, 01/05/2021, 01/23/2021     PFTs    Latest Ref Rng &  Units 12/07/2018    8:53 AM  PFT Results  FVC-Pre L 1.73   FVC-Predicted Pre % 49   FVC-Post L 1.99   FVC-Predicted Post % 56   Pre FEV1/FVC % % 46   Post FEV1/FCV % % 47   FEV1-Pre L 0.80   FEV1-Predicted Pre % 29   FEV1-Post L 0.93   DLCO uncorrected ml/min/mmHg 10.76   DLCO UNC% % 49   DLVA Predicted % 73   TLC L 6.80   TLC % Predicted % 123   RV % Predicted % 222      Eosinophils Most recent blood eosinophil count was 100 cells/microL taken on 08/31/23.   IgE: 13 on 08/31/23   Assessment   Biologics training for mepolizumab  (Nucala )  Goals of therapy: Mechanism of Action: Not fully understood. It does act an interleukin-5 (IL-5) antagonist monoclonal antibody that reduces the production and survival of eosinophils by blocking the binding of IL-5 to the alpha chain of the receptor complex on the eosinophil cell surface. Reviewed that Nucala  is add-on medication and patient must continue maintenance inhaler regimen. Response to therapy: may take 3 months to 6 months to  determine efficacy. Discussed that patients generally feel improvement sooner than 3 months.  Side effects: headache (19%), injection site reaction (7-15%), antibody development (6%), backache (5%), fatigue (5%)  Dose: 100 mg subcutaneously every 4 weeks  Administration/Storage:  Reviewed administration sites of thigh or abdomen (at least 2-3 inches away from abdomen). Reviewed the upper arm is only appropriate if caregiver is administering injection  Do not shake the reconstituted solution as this could lead to product foaming or precipitation. Solution should be clear to opalescent and colorless to pale yellow or pale brown, essentially particle free. Small air bubbles, however, are expected and acceptable. If particulate matter remains in the solution or if the solution appears cloudy or milky, discard the solution.  Reviewed storage of medication in refrigerator. Reviewed that Nucala  can be stored at room  temperature in unopened carton for up to 7 days.  Access: Approval of Nucala  through: insurance  Patient self-administered Nucala  100mg /mL in {injsitedsg:28167} using [facility]-supplied medication.  Nucala  100mg /mL autoinjector pen NDC: *** Lot: *** Expiration: ***  Patient monitored for 30 minutes for adverse reaction.  Patient tolerated ***.  Injection site noted. {injectionreaction:30756}  Medication Reconciliation  A drug regimen assessment was performed, including review of allergies, interactions, disease-state management, dosing and immunization history. Medications were reviewed with the patient, including name, instructions, indication, goals of therapy, potential side effects, importance of adherence, and safe use.  Drug interaction(s): ***   PLAN Continue Nucala  100mg  every 4 weeks. Next dose is due 10/21/23 and every 4 weeks thereafter. Rx sent to: [facility]: [phone] . Patient provided with pharmacy phone number. Continue maintenance asthma regimen of: *** Breztri  160-9-4.8 mcg/act (Inhale 2 puffs into the lungs every 12 hours),  Ventolin  108 mcg/act (Inhale 2 puffs into the lungs every 6 hours PRN wheezing, shortness of breath), azithromycin  250mg  tab (Take 1 tab by mouth every Mo/We/Fri)  All questions encouraged and answered.  Instructed patient to reach out with any further questions or concerns.  Thank you for allowing pharmacy to participate in this patient's care.  This appointment required 45 minutes of patient care (this includes precharting, chart review, review of results, face-to-face care, etc.).

## 2023-09-23 NOTE — Telephone Encounter (Signed)
 No-show visit 09/23/23 for new start Nucala . ATC patient - LVM with contact number to return call.  Aleck Puls, PharmD, BCPS Clinical Pharmacist  [facility]

## 2023-09-23 NOTE — Progress Notes (Deleted)
 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                           -Skin biopsy was negative for alpha-synuclein.             - The above (atypical/red flag features in combination with negative skin biopsy) make the diagnosis of PSP much more likely, although SCA-2/3 could also be possibilities.  She, however, does not report a family history of such.  Abnormal DaTscan  has been reported in patients who have SCA with dystonia/parkinsonism, especially in those who have SCA-2.  SCA-17 can present with dystonia and parkinsonism, but this is generally seen in Asian populations and clinical picture does not completely fit this.  Discussed with her that we can go ahead and do the genetic testing for SCA, if she would like, but ultimately she decided to hold on that.  It probably would not change much we did clinically.  2.  Falls with recent sacral insufficiency fractures   Subjective:   April [name] was seen today in follow up for atypical parkinsonism.  We had anticipated follow-up prior to today, but unfortunately patient ended up in the hospital for a few stays.  She was admitted in July for shortness of breath, felt to be an acute exacerbation of COPD.  She showed back up in the hospital a few weeks later with significant back pain, noting no trauma recently, but has had falls within the month.  She had imaging that diagnosed bilateral sacral insufficiency fractures.  She was discharged August 14 to SNF.  Her friend called here a few days later stated that she was having paranoia associated with PSP.  Her friend was not on the release, but nonetheless I did note that at this stage of the disease, that would likely not be the etiology and another source should be sought out.  Current prescribed movement disorder  medications: ***   PREVIOUS MEDICATIONS: {Parkinson's RX:18200}  ALLERGIES:   Allergies  Allergen Reactions   Sulfa Antibiotics Anaphylaxis, Hives and Dermatitis   Sulfonamide Derivatives Anaphylaxis, Dermatitis, Rash and Other (See Comments)    Reaction occurred when patient was a child. Throat closed, plus a rash.   Alendronate Sodium Other (See Comments)    GI upset   Dilaudid  [Hydromorphone  Hcl] Nausea And Vomiting   Molds & Smuts Itching and Other (See Comments)    Itchy eyes/runny nose   Pollen Extract Itching and Other (See Comments)    Itchy eyes/runny nose   Wound Dressing Adhesive Other (See Comments)    Redness, adhesive tapes. Needs PAPER TAPE.    CURRENT MEDICATIONS:  No outpatient medications have been marked as taking for the 09/27/23 encounter (Appointment) with Nicolo Tomko, Asberry RAMAN, DO.     Objective:   PHYSICAL EXAMINATION:    VITALS:  There were no vitals filed for this visit.  GEN:  The patient appears stated age and is in NAD. HEENT:  Normocephalic, atraumatic.  The mucous membranes are moist. The superficial temporal arteries are without ropiness or tenderness. CV:  RRR Lungs:  CTAB Neck/HEME:  There are no carotid bruits bilaterally.  Neurological examination:  Orientation: The patient is alert and oriented x3. Cranial nerves: There is good facial symmetry with*** facial  hypomimia. The speech is fluent and clear.  Extraocular muscles are intact.  There are square wave jerks.  There is trouble with smooth pursuit.  Soft palate rises symmetrically and there is no tongue deviation. Hearing is intact to conversational tone. Sensation: Sensation is intact to light touch throughout Motor: Strength is at least antigravity x4.  Movement examination: Tone: There is normal tone in the bilateral upper extremities.  The tone in the lower extremities is normal.  Abnormal movements: none Coordination:  There is  decremation with RAM's, mostly with finger and toe  taps on the right Gait and Station: The patient pushes off to arise.  She grabs her walker.  She is just slightly forward flexed.  The right foot/ankle is inverted.  When the walker is taken away, she is still narrow-based.  She is tenuous and careful with the gait and holds onto the examiner.  The foot remains inverted on the right.      I have reviewed and interpreted the following labs independently    Chemistry      Component Value Date/Time   NA 133 (L) 08/19/2023 0328   NA 138 01/31/2021 1029   K 4.0 08/19/2023 0328   CL 99 08/19/2023 0328   CO2 26 08/19/2023 0328   BUN 14 08/19/2023 0328   BUN 15 01/31/2021 1029   CREATININE 0.94 08/19/2023 0328   CREATININE 1.13 (H) 06/03/2016 1213      Component Value Date/Time   CALCIUM  8.8 (L) 08/19/2023 0328   ALKPHOS 111 08/16/2023 1057   AST 19 08/16/2023 1057   ALT 18 08/16/2023 1057   BILITOT 0.6 08/16/2023 1057   BILITOT 0.4 05/20/2021 1154       Lab Results  Component Value Date   WBC 12.9 (H) 08/31/2023   HGB 11.5 08/31/2023   HCT 35.9 08/31/2023   MCV 98 (H) 08/31/2023   PLT 262 08/31/2023    Lab Results  Component Value Date   TSH 2.111 07/24/2023     Total time spent on today's visit was ***30 minutes, including both face-to-face time and nonface-to-face time.  Time included that spent on review of records (prior notes available to me/labs/imaging if pertinent), discussing treatment and goals, answering patient's questions and coordinating care.  Cc:  Tonnie Jon SAILOR, NP

## 2023-09-24 DIAGNOSIS — R2689 Other abnormalities of gait and mobility: Secondary | ICD-10-CM | POA: Diagnosis not present

## 2023-09-24 DIAGNOSIS — Z8781 Personal history of (healed) traumatic fracture: Secondary | ICD-10-CM | POA: Diagnosis not present

## 2023-09-24 DIAGNOSIS — Z9181 History of falling: Secondary | ICD-10-CM | POA: Diagnosis not present

## 2023-09-24 DIAGNOSIS — M6250 Muscle wasting and atrophy, not elsewhere classified, unspecified site: Secondary | ICD-10-CM | POA: Diagnosis not present

## 2023-09-24 DIAGNOSIS — R600 Localized edema: Secondary | ICD-10-CM | POA: Diagnosis not present

## 2023-09-24 DIAGNOSIS — I509 Heart failure, unspecified: Secondary | ICD-10-CM | POA: Diagnosis not present

## 2023-09-24 DIAGNOSIS — R296 Repeated falls: Secondary | ICD-10-CM | POA: Diagnosis not present

## 2023-09-24 DIAGNOSIS — R278 Other lack of coordination: Secondary | ICD-10-CM | POA: Diagnosis not present

## 2023-09-24 DIAGNOSIS — R410 Disorientation, unspecified: Secondary | ICD-10-CM | POA: Diagnosis not present

## 2023-09-24 DIAGNOSIS — M62522 Muscle wasting and atrophy, not elsewhere classified, left upper arm: Secondary | ICD-10-CM | POA: Diagnosis not present

## 2023-09-24 DIAGNOSIS — M62521 Muscle wasting and atrophy, not elsewhere classified, right upper arm: Secondary | ICD-10-CM | POA: Diagnosis not present

## 2023-09-24 DIAGNOSIS — R2681 Unsteadiness on feet: Secondary | ICD-10-CM | POA: Diagnosis not present

## 2023-09-27 ENCOUNTER — Ambulatory Visit: Admitting: Neurology

## 2023-09-27 ENCOUNTER — Encounter: Payer: Self-pay | Admitting: Neurology

## 2023-09-27 DIAGNOSIS — R2681 Unsteadiness on feet: Secondary | ICD-10-CM | POA: Diagnosis not present

## 2023-09-27 DIAGNOSIS — M62522 Muscle wasting and atrophy, not elsewhere classified, left upper arm: Secondary | ICD-10-CM | POA: Diagnosis not present

## 2023-09-27 DIAGNOSIS — R2689 Other abnormalities of gait and mobility: Secondary | ICD-10-CM | POA: Diagnosis not present

## 2023-09-27 DIAGNOSIS — R278 Other lack of coordination: Secondary | ICD-10-CM | POA: Diagnosis not present

## 2023-09-27 DIAGNOSIS — M6250 Muscle wasting and atrophy, not elsewhere classified, unspecified site: Secondary | ICD-10-CM | POA: Diagnosis not present

## 2023-09-27 DIAGNOSIS — R296 Repeated falls: Secondary | ICD-10-CM | POA: Diagnosis not present

## 2023-09-27 DIAGNOSIS — M62521 Muscle wasting and atrophy, not elsewhere classified, right upper arm: Secondary | ICD-10-CM | POA: Diagnosis not present

## 2023-09-27 DIAGNOSIS — Z029 Encounter for administrative examinations, unspecified: Secondary | ICD-10-CM

## 2023-09-27 NOTE — Telephone Encounter (Signed)
 ATC patient (second attempt) to reschedule new start Nucala . Mailbox is full.   Will make third attempt in one week.   Aleck Puls, PharmD, BCPS Clinical Pharmacist  [facility]

## 2023-09-28 ENCOUNTER — Telehealth: Payer: Self-pay | Admitting: Neurology

## 2023-09-28 ENCOUNTER — Ambulatory Visit: Admitting: Physician Assistant

## 2023-09-28 ENCOUNTER — Telehealth (HOSPITAL_COMMUNITY): Payer: Self-pay

## 2023-09-28 DIAGNOSIS — Z8781 Personal history of (healed) traumatic fracture: Secondary | ICD-10-CM

## 2023-09-28 DIAGNOSIS — M62521 Muscle wasting and atrophy, not elsewhere classified, right upper arm: Secondary | ICD-10-CM | POA: Diagnosis not present

## 2023-09-28 DIAGNOSIS — Z87891 Personal history of nicotine dependence: Secondary | ICD-10-CM

## 2023-09-28 DIAGNOSIS — M810 Age-related osteoporosis without current pathological fracture: Secondary | ICD-10-CM

## 2023-09-28 DIAGNOSIS — R296 Repeated falls: Secondary | ICD-10-CM

## 2023-09-28 DIAGNOSIS — I10 Essential (primary) hypertension: Secondary | ICD-10-CM

## 2023-09-28 DIAGNOSIS — R29898 Other symptoms and signs involving the musculoskeletal system: Secondary | ICD-10-CM

## 2023-09-28 DIAGNOSIS — R768 Other specified abnormal immunological findings in serum: Secondary | ICD-10-CM

## 2023-09-28 DIAGNOSIS — M353 Polymyalgia rheumatica: Secondary | ICD-10-CM

## 2023-09-28 DIAGNOSIS — Z8639 Personal history of other endocrine, nutritional and metabolic disease: Secondary | ICD-10-CM

## 2023-09-28 DIAGNOSIS — M6250 Muscle wasting and atrophy, not elsewhere classified, unspecified site: Secondary | ICD-10-CM | POA: Diagnosis not present

## 2023-09-28 DIAGNOSIS — R2681 Unsteadiness on feet: Secondary | ICD-10-CM | POA: Diagnosis not present

## 2023-09-28 DIAGNOSIS — R2689 Other abnormalities of gait and mobility: Secondary | ICD-10-CM | POA: Diagnosis not present

## 2023-09-28 DIAGNOSIS — M62522 Muscle wasting and atrophy, not elsewhere classified, left upper arm: Secondary | ICD-10-CM | POA: Diagnosis not present

## 2023-09-28 DIAGNOSIS — Z96643 Presence of artificial hip joint, bilateral: Secondary | ICD-10-CM

## 2023-09-28 DIAGNOSIS — R278 Other lack of coordination: Secondary | ICD-10-CM | POA: Diagnosis not present

## 2023-09-28 DIAGNOSIS — M316 Other giant cell arteritis: Secondary | ICD-10-CM

## 2023-09-28 DIAGNOSIS — Z96612 Presence of left artificial shoulder joint: Secondary | ICD-10-CM

## 2023-09-28 DIAGNOSIS — F32A Depression, unspecified: Secondary | ICD-10-CM

## 2023-09-28 DIAGNOSIS — M47816 Spondylosis without myelopathy or radiculopathy, lumbar region: Secondary | ICD-10-CM

## 2023-09-28 DIAGNOSIS — Z8709 Personal history of other diseases of the respiratory system: Secondary | ICD-10-CM

## 2023-09-28 NOTE — Telephone Encounter (Signed)
 Pt. April [name] due to hospitalization and needs to resched but when [name] can join per last [name] notes and Pt.

## 2023-09-28 NOTE — Telephone Encounter (Signed)
 Called patient regarding pulmonary rehab, went over program. Patient is interested. Will verify insurance and call patient back to schedule.

## 2023-09-29 ENCOUNTER — Emergency Department (HOSPITAL_COMMUNITY)

## 2023-09-29 ENCOUNTER — Telehealth (HOSPITAL_COMMUNITY): Payer: Self-pay

## 2023-09-29 ENCOUNTER — Observation Stay (HOSPITAL_COMMUNITY)
Admission: EM | Admit: 2023-09-29 | Discharge: 2023-09-30 | Disposition: A | Discharge status: Home / Self Care | Attending: Internal Medicine | Admitting: Internal Medicine

## 2023-09-29 ENCOUNTER — Encounter (HOSPITAL_COMMUNITY): Payer: Self-pay

## 2023-09-29 DIAGNOSIS — R531 Weakness: Secondary | ICD-10-CM | POA: Diagnosis not present

## 2023-09-29 DIAGNOSIS — G231 Progressive supranuclear ophthalmoplegia [Steele-Richardson-Olszewski]: Secondary | ICD-10-CM | POA: Insufficient documentation

## 2023-09-29 DIAGNOSIS — N183 Chronic kidney disease, stage 3 unspecified: Secondary | ICD-10-CM | POA: Diagnosis not present

## 2023-09-29 DIAGNOSIS — S81801A Unspecified open wound, right lower leg, initial encounter: Secondary | ICD-10-CM | POA: Diagnosis not present

## 2023-09-29 DIAGNOSIS — F329 Major depressive disorder, single episode, unspecified: Secondary | ICD-10-CM | POA: Diagnosis not present

## 2023-09-29 DIAGNOSIS — Z85828 Personal history of other malignant neoplasm of skin: Secondary | ICD-10-CM | POA: Diagnosis not present

## 2023-09-29 DIAGNOSIS — M810 Age-related osteoporosis without current pathological fracture: Secondary | ICD-10-CM | POA: Insufficient documentation

## 2023-09-29 DIAGNOSIS — R2689 Other abnormalities of gait and mobility: Secondary | ICD-10-CM | POA: Diagnosis not present

## 2023-09-29 DIAGNOSIS — Z79899 Other long term (current) drug therapy: Secondary | ICD-10-CM | POA: Diagnosis not present

## 2023-09-29 DIAGNOSIS — I5032 Chronic diastolic (congestive) heart failure: Secondary | ICD-10-CM | POA: Diagnosis not present

## 2023-09-29 DIAGNOSIS — J45909 Unspecified asthma, uncomplicated: Secondary | ICD-10-CM | POA: Insufficient documentation

## 2023-09-29 DIAGNOSIS — W06XXXA Fall from bed, initial encounter: Secondary | ICD-10-CM | POA: Diagnosis not present

## 2023-09-29 DIAGNOSIS — E039 Hypothyroidism, unspecified: Secondary | ICD-10-CM | POA: Insufficient documentation

## 2023-09-29 DIAGNOSIS — R059 Cough, unspecified: Secondary | ICD-10-CM | POA: Diagnosis not present

## 2023-09-29 DIAGNOSIS — N179 Acute kidney failure, unspecified: Secondary | ICD-10-CM | POA: Insufficient documentation

## 2023-09-29 DIAGNOSIS — R609 Edema, unspecified: Secondary | ICD-10-CM | POA: Diagnosis not present

## 2023-09-29 DIAGNOSIS — S81809A Unspecified open wound, unspecified lower leg, initial encounter: Secondary | ICD-10-CM

## 2023-09-29 DIAGNOSIS — E785 Hyperlipidemia, unspecified: Secondary | ICD-10-CM | POA: Diagnosis not present

## 2023-09-29 DIAGNOSIS — R600 Localized edema: Secondary | ICD-10-CM | POA: Insufficient documentation

## 2023-09-29 DIAGNOSIS — R2681 Unsteadiness on feet: Secondary | ICD-10-CM | POA: Diagnosis not present

## 2023-09-29 DIAGNOSIS — Z96612 Presence of left artificial shoulder joint: Secondary | ICD-10-CM | POA: Insufficient documentation

## 2023-09-29 DIAGNOSIS — Z7951 Long term (current) use of inhaled steroids: Secondary | ICD-10-CM | POA: Diagnosis not present

## 2023-09-29 DIAGNOSIS — I130 Hypertensive heart and chronic kidney disease with heart failure and stage 1 through stage 4 chronic kidney disease, or unspecified chronic kidney disease: Secondary | ICD-10-CM | POA: Insufficient documentation

## 2023-09-29 DIAGNOSIS — Z96641 Presence of right artificial hip joint: Secondary | ICD-10-CM | POA: Diagnosis not present

## 2023-09-29 DIAGNOSIS — J449 Chronic obstructive pulmonary disease, unspecified: Secondary | ICD-10-CM | POA: Diagnosis present

## 2023-09-29 DIAGNOSIS — W19XXXA Unspecified fall, initial encounter: Secondary | ICD-10-CM | POA: Diagnosis not present

## 2023-09-29 DIAGNOSIS — R509 Fever, unspecified: Secondary | ICD-10-CM | POA: Diagnosis not present

## 2023-09-29 DIAGNOSIS — R296 Repeated falls: Secondary | ICD-10-CM

## 2023-09-29 DIAGNOSIS — Z87891 Personal history of nicotine dependence: Secondary | ICD-10-CM | POA: Insufficient documentation

## 2023-09-29 DIAGNOSIS — R918 Other nonspecific abnormal finding of lung field: Secondary | ICD-10-CM | POA: Diagnosis not present

## 2023-09-29 DIAGNOSIS — L97909 Non-pressure chronic ulcer of unspecified part of unspecified lower leg with unspecified severity: Secondary | ICD-10-CM | POA: Diagnosis not present

## 2023-09-29 DIAGNOSIS — G20B2 Parkinson's disease with dyskinesia, with fluctuations: Secondary | ICD-10-CM | POA: Insufficient documentation

## 2023-09-29 DIAGNOSIS — M6250 Muscle wasting and atrophy, not elsewhere classified, unspecified site: Secondary | ICD-10-CM | POA: Diagnosis not present

## 2023-09-29 DIAGNOSIS — R0602 Shortness of breath: Secondary | ICD-10-CM | POA: Diagnosis not present

## 2023-09-29 DIAGNOSIS — J441 Chronic obstructive pulmonary disease with (acute) exacerbation: Secondary | ICD-10-CM | POA: Diagnosis not present

## 2023-09-29 DIAGNOSIS — I10 Essential (primary) hypertension: Secondary | ICD-10-CM | POA: Diagnosis present

## 2023-09-29 LAB — I-STAT VENOUS BLOOD GAS, ED
Acid-Base Excess: 2 mmol/L (ref 0.0–2.0)
Bicarbonate: 28.5 mmol/L — ABNORMAL HIGH (ref 20.0–28.0)
Calcium, Ion: 1.17 mmol/L (ref 1.15–1.40)
HCT: 31 % — ABNORMAL LOW (ref 36.0–46.0)
Hemoglobin: 10.5 g/dL — ABNORMAL LOW (ref 12.0–15.0)
O2 Saturation: 90 %
Potassium: 4.2 mmol/L (ref 3.5–5.1)
Sodium: 138 mmol/L (ref 135–145)
TCO2: 30 mmol/L (ref 22–32)
pCO2, Ven: 52 mmHg (ref 44–60)
pH, Ven: 7.347 (ref 7.25–7.43)
pO2, Ven: 63 mmHg — ABNORMAL HIGH (ref 32–45)

## 2023-09-29 LAB — COMPREHENSIVE METABOLIC PANEL WITH GFR
ALT: 16 U/L (ref 0–44)
AST: 21 U/L (ref 15–41)
Albumin: 3.2 g/dL — ABNORMAL LOW (ref 3.5–5.0)
Alkaline Phosphatase: 131 U/L — ABNORMAL HIGH (ref 38–126)
Anion gap: 10 (ref 5–15)
BUN: 28 mg/dL — ABNORMAL HIGH (ref 8–23)
CO2: 26 mmol/L (ref 22–32)
Calcium: 9.1 mg/dL (ref 8.9–10.3)
Chloride: 101 mmol/L (ref 98–111)
Creatinine, Ser: 2.01 mg/dL — ABNORMAL HIGH (ref 0.44–1.00)
GFR, Estimated: 26 mL/min — ABNORMAL LOW (ref >60)
Glucose, Bld: 108 mg/dL — ABNORMAL HIGH (ref 70–99)
Potassium: 4 mmol/L (ref 3.5–5.1)
Sodium: 137 mmol/L (ref 135–145)
Total Bilirubin: 0.9 mg/dL (ref 0.0–1.2)
Total Protein: 5.9 g/dL — ABNORMAL LOW (ref 6.5–8.1)

## 2023-09-29 LAB — RESP PANEL BY RT-PCR (RSV, FLU A&B, COVID)  RVPGX2
Influenza A by PCR: NEGATIVE
Influenza B by PCR: NEGATIVE
Resp Syncytial Virus by PCR: NEGATIVE
SARS Coronavirus 2 by RT PCR: NEGATIVE

## 2023-09-29 LAB — CBC WITH DIFFERENTIAL/PLATELET
Abs Immature Granulocytes: 0.06 K/uL (ref 0.00–0.07)
Basophils Absolute: 0.1 K/uL (ref 0.0–0.1)
Basophils Relative: 1 %
Eosinophils Absolute: 0.3 K/uL (ref 0.0–0.5)
Eosinophils Relative: 4 %
HCT: 34.1 % — ABNORMAL LOW (ref 36.0–46.0)
Hemoglobin: 10.7 g/dL — ABNORMAL LOW (ref 12.0–15.0)
Immature Granulocytes: 1 %
Lymphocytes Relative: 11 %
Lymphs Abs: 0.7 K/uL (ref 0.7–4.0)
MCH: 30.4 pg (ref 26.0–34.0)
MCHC: 31.4 g/dL (ref 30.0–36.0)
MCV: 96.9 fL (ref 80.0–100.0)
Monocytes Absolute: 0.5 K/uL (ref 0.1–1.0)
Monocytes Relative: 8 %
Neutro Abs: 4.9 K/uL (ref 1.7–7.7)
Neutrophils Relative %: 75 %
Platelets: 201 K/uL (ref 150–400)
RBC: 3.52 MIL/uL — ABNORMAL LOW (ref 3.87–5.11)
RDW: 14.6 % (ref 11.5–15.5)
WBC: 6.6 K/uL (ref 4.0–10.5)
nRBC: 0 % (ref 0.0–0.2)

## 2023-09-29 LAB — TROPONIN I (HIGH SENSITIVITY)
Troponin I (High Sensitivity): 6 ng/L (ref <18)
Troponin I (High Sensitivity): 5 ng/L (ref <18)

## 2023-09-29 LAB — BRAIN NATRIURETIC PEPTIDE: B Natriuretic Peptide: 8.8 pg/mL (ref 0.0–100.0)

## 2023-09-29 MED ORDER — ACETAMINOPHEN 325 MG PO TABS
650.0000 mg | ORAL_TABLET | Freq: Four times a day (QID) | ORAL | Status: DC | PRN
  Administered 2023-09-30: 650 mg via ORAL
  Filled 2023-09-29: qty 2

## 2023-09-29 MED ORDER — ACETAMINOPHEN 650 MG RE SUPP: 650.0000 mg | Freq: Four times a day (QID) | RECTAL | Status: DC | PRN

## 2023-09-29 MED ORDER — BUDESON-GLYCOPYRROL-FORMOTEROL 160-9-4.8 MCG/ACT IN AERO
2.0000 | INHALATION_SPRAY | Freq: Two times a day (BID) | RESPIRATORY_TRACT | Status: DC
  Administered 2023-09-29 – 2023-09-30 (×2): 2 via RESPIRATORY_TRACT
  Filled 2023-09-29: qty 5.9

## 2023-09-29 MED ORDER — POLYETHYLENE GLYCOL 3350 17 G PO PACK: 17.0000 g | PACK | Freq: Every day | ORAL | Status: DC | PRN

## 2023-09-29 MED ORDER — ROSUVASTATIN CALCIUM 5 MG PO TABS
10.0000 mg | ORAL_TABLET | Freq: Every day | ORAL | Status: DC
  Administered 2023-09-29: 10 mg via ORAL
  Filled 2023-09-29: qty 2

## 2023-09-29 MED ORDER — ENSURE PLUS HIGH PROTEIN PO LIQD: 237.0000 mL | Freq: Two times a day (BID) | ORAL | Status: DC

## 2023-09-29 MED ORDER — VITAMIN D 25 MCG (1000 UNIT) PO TABS
5000.0000 [IU] | ORAL_TABLET | Freq: Every day | ORAL | Status: DC
  Administered 2023-09-29 – 2023-09-30 (×2): 5000 [IU] via ORAL
  Filled 2023-09-29 (×2): qty 5

## 2023-09-29 MED ORDER — CALCIUM CARBONATE 1250 (500 CA) MG PO TABS
1250.0000 mg | ORAL_TABLET | Freq: Two times a day (BID) | ORAL | Status: DC
  Administered 2023-09-29 – 2023-09-30 (×2): 1250 mg via ORAL
  Filled 2023-09-29 (×2): qty 1

## 2023-09-29 MED ORDER — IPRATROPIUM-ALBUTEROL 0.5-2.5 (3) MG/3ML IN SOLN
3.0000 mL | Freq: Once | RESPIRATORY_TRACT | Status: AC
  Administered 2023-09-29: 3 mL via RESPIRATORY_TRACT
  Filled 2023-09-29: qty 3

## 2023-09-29 MED ORDER — ENOXAPARIN SODIUM 30 MG/0.3ML IJ SOSY
30.0000 mg | PREFILLED_SYRINGE | INTRAMUSCULAR | Status: DC
  Administered 2023-09-29: 30 mg via SUBCUTANEOUS
  Filled 2023-09-29: qty 0.3

## 2023-09-29 MED ORDER — PREDNISONE 20 MG PO TABS
40.0000 mg | ORAL_TABLET | Freq: Every day | ORAL | Status: DC
  Administered 2023-09-30: 40 mg via ORAL
  Filled 2023-09-29: qty 2

## 2023-09-29 MED ORDER — CARBIDOPA-LEVODOPA 25-100 MG PO TABS
1.0000 | ORAL_TABLET | ORAL | Status: DC
  Administered 2023-09-29 – 2023-09-30 (×3): 1 via ORAL
  Filled 2023-09-29 (×3): qty 1

## 2023-09-29 MED ORDER — AZITHROMYCIN 250 MG PO TABS
250.0000 mg | ORAL_TABLET | ORAL | Status: DC
  Administered 2023-09-29: 250 mg via ORAL
  Filled 2023-09-29: qty 1

## 2023-09-29 MED ORDER — METHYLPREDNISOLONE SODIUM SUCC 125 MG IJ SOLR
125.0000 mg | Freq: Once | INTRAMUSCULAR | Status: AC
  Administered 2023-09-29: 125 mg via INTRAVENOUS
  Filled 2023-09-29: qty 2

## 2023-09-29 MED ORDER — POLYVINYL ALCOHOL 1.4 % OP SOLN
1.0000 [drp] | Freq: Two times a day (BID) | OPHTHALMIC | Status: DC
  Administered 2023-09-29 – 2023-09-30 (×2): 1 [drp] via OPHTHALMIC
  Filled 2023-09-29: qty 15

## 2023-09-29 MED ORDER — PANTOPRAZOLE SODIUM 40 MG PO TBEC
40.0000 mg | DELAYED_RELEASE_TABLET | Freq: Every day | ORAL | Status: DC
Start: 2023-09-29 — End: 2023-09-30
  Administered 2023-09-29 – 2023-09-30 (×2): 40 mg via ORAL
  Filled 2023-09-29 (×2): qty 1

## 2023-09-29 MED ORDER — SERTRALINE HCL 50 MG PO TABS
50.0000 mg | ORAL_TABLET | Freq: Every day | ORAL | Status: DC
Start: 2023-09-29 — End: 2023-09-30
  Administered 2023-09-29: 50 mg via ORAL
  Filled 2023-09-29: qty 1

## 2023-09-29 MED ORDER — IPRATROPIUM-ALBUTEROL 0.5-2.5 (3) MG/3ML IN SOLN: 3.0000 mL | RESPIRATORY_TRACT | Status: DC | PRN

## 2023-09-29 MED ORDER — SODIUM CHLORIDE 0.9 % IV BOLUS
1000.0000 mL | Freq: Once | INTRAVENOUS | Status: AC
  Administered 2023-09-29: 1000 mL via INTRAVENOUS

## 2023-09-29 MED ORDER — LEVOTHYROXINE SODIUM 88 MCG PO TABS
88.0000 ug | ORAL_TABLET | Freq: Every day | ORAL | Status: DC
  Administered 2023-09-30: 88 ug via ORAL
  Filled 2023-09-29: qty 1

## 2023-09-29 MED ORDER — SODIUM CHLORIDE 0.9 % IV BOLUS
500.0000 mL | Freq: Once | INTRAVENOUS | Status: AC
  Administered 2023-09-29: 500 mL via INTRAVENOUS

## 2023-09-29 NOTE — ED Provider Notes (Signed)
 [facility] AT [facility] Provider Note  CSN: [id number] Arrival date & time: 09/29/23 1235  Chief Complaint(s) Weakness and Shortness of Breath  HPI April [name] is a 68 y.o. female history of CHF, COPD, hypertension, asthma, hyperlipidemia presenting to the emergency department shortness of breath.  Patient reports shortness of breath for the past few days.  Reports dry cough.  Reports associated wheezing.  Reports that she has been eating and drinking less and feeling very tired.  Denies any painful urination, abdominal pain, chest pain, back pain.  Denies any productive cough.  Denies any fevers or chills.  Paramedics found patient was hypotensive to 60/40, received fluids with some improvement.   Past Medical History Past Medical History:  Diagnosis Date   Allergy    Anemia    Anxiety    Anxiety and depression    related to caring for mother during terminal illness   Arthritis    Asthma    AVN (avascular necrosis of bone) (HCC)    hip and wrist   Bronchitis    hx of   Cataract    Chronic kidney disease    COPD (chronic obstructive pulmonary disease) (HCC)    Depression    Emphysema of lung (HCC)    Endometriosis    FH: CAD (coronary artery disease)    GERD (gastroesophageal reflux disease)    ocassional   History of blood transfusion    after hip repackment - broke out in hives and started itching   History of palpitations    Hyperlipidemia    Hypertension    Hypothyroid    Neuromuscular disorder (HCC)    Non-ischemic cardiomyopathy (HCC) 2019   Osteopenia    forteo through Dr. Dolphus (started 10/12)   Osteoporosis    PMR (polymyalgia rheumatica)    SIRS (systemic inflammatory response syndrome) (HCC) 10/2017   Smoker    SOB (shortness of breath) on exertion    Squamous cell carcinoma    facial, 2016   Temporal arteritis (HCC)    s/p prednisone  taper   Patient Active Problem List   Diagnosis Date Noted   Pelvic  fracture (HCC) 08/17/2023   Acute exacerbation of COPD with asthma (HCC) 07/24/2023   Acute exacerbation of chronic obstructive pulmonary disease (COPD) (HCC) 07/23/2023   Atrophy of skin due to systemic corticosteroid 04/08/2023   Parkinson's disease with dyskinesia and fluctuating manifestations (HCC) 04/08/2023   Bilateral lower extremity edema 05/27/2022   Frequent falls 03/05/2022   Serrated polyp of colon 03/05/2022   Mixed hyperlipidemia 12/03/2021   Unsteady gait 07/20/2021   Chronic diastolic CHF (congestive heart failure) (HCC) 06/13/2021   Upper airway cough syndrome 05/07/2021   Osteoporosis 03/23/2021   Allergic rhinitis 04/06/2019   Aortic atherosclerosis 12/17/2017   Pulmonary nodule 12/17/2017   GERD (gastroesophageal reflux disease) 12/12/2017   COPD, severe (HCC) 11/24/2017   CKD (chronic kidney disease), stage III (HCC) 10/28/2017   Vertigo 10/27/2017   Essential hypertension 06/23/2017   Polymyalgia rheumatica 05/22/2016   History of bilateral hip replacements 05/22/2016   DDD (degenerative disc disease), lumbar 05/22/2016   History of avascular necrosis of capital femoral epiphysis 08/14/2014   Asthma 03/23/2010   Acquired hypothyroidism 03/21/2010   Chronic recurrent major depressive disorder 03/21/2010   Former smoker 03/21/2010   History of temporal arteritis 03/21/2010   Home Medication(s) Prior to Admission medications   Medication Sig Start Date End Date Taking? Authorizing Provider  acetaminophen  (TYLENOL ) 650 MG CR  tablet Take 1,300 mg by mouth at bedtime as needed for pain.    [provider]  albuterol  (VENTOLIN  HFA) 108 (90 Base) MCG/ACT inhaler Inhale 2 puffs into the lungs every 6 (six) hours as needed for wheezing or shortness of breath.    [provider]  azithromycin  (ZITHROMAX ) 250 MG tablet Take 1 tablet (250 mg total) by mouth every Monday, Wednesday, and Friday. 03/03/23 02/02/24  [name], NP   budeson-glycopyrrolate -formoterol  (BREZTRI  AEROSPHERE) 160-9-4.8 MCG/ACT AERO Inhale 2 puffs into the lungs every 12 (twelve) hours. 03/30/23   [name], NP  buPROPion  (WELLBUTRIN  XL) 300 MG 24 hr tablet TAKE 1 TABLET BY MOUTH DAILY Patient taking differently: Take 300 mg by mouth in the morning. 06/07/23   Jodie Lavern CROME, MD  Calcium  500 MG CHEW Chew 500 mg by mouth 2 (two) times daily.    [provider]  carbidopa -levodopa  (SINEMET  IR) 25-100 MG tablet TAKE 1 TABLET BY MOUTH 3 TIMES A DAY Patient taking differently: Take 1 tablet by mouth See admin instructions. Take 1 tablet by mouth at 8 AM, 12 NOON, and 5 PM 08/03/23   Tat, Rebecca S, DO  Cholecalciferol  (VITAMIN D -3) 125 MCG (5000 UT) TABS Take 5,000 Units by mouth daily.    [provider]  Cyanocobalamin  (VITAMIN B 12 PO) Take 1 capsule by mouth daily.    [provider]  feeding supplement (ENSURE PLUS HIGH PROTEIN) LIQD Take 237 mLs by mouth 2 (two) times daily between meals. 07/28/23   Shalhoub, Zachary PARAS, MD  furosemide  (LASIX ) 20 MG tablet TAKE 1 TABLET BY MOUTH DAILY AS NEEDED FOR EDEMA Patient taking differently: Take 20 mg by mouth daily as needed for edema. 07/07/23   [name], MD  levothyroxine  (SYNTHROID ) 88 MCG tablet Take 88 mcg by mouth daily before breakfast.    [provider]  melatonin 5 MG TABS Take 1 tablet (5 mg total) by mouth at bedtime. 07/28/23   Shalhoub, Zachary PARAS, MD  Mepolizumab  (NUCALA ) 100 MG/ML SOAJ Inject contents of one pen (100mg ) in the skin on day 0 in clinic, and every 28 days thereafter. Courier to pulm: [address], [place] [address]. Appt on 09/23/23. 09/16/23   Jude Harden GAILS, MD  methocarbamol  (ROBAXIN ) 750 MG tablet Take 750 mg by mouth 2 (two) times daily as needed for muscle spasms (if not takingTizanidine).    [provider]  metoprolol  succinate (TOPROL -XL) 25 MG 24 hr tablet Take 1 tablet (25 mg total) by mouth  daily. Patient taking differently: Take 25 mg by mouth at bedtime. 03/01/23   [name], MD  omeprazole  (PRILOSEC) 20 MG capsule TAKE 1 CAPSULE BY MOUTH EVERY DAY Patient taking differently: Take 20 mg by mouth at bedtime. 01/20/23   Jodie Lavern CROME, MD  oxyCODONE  (OXY IR/ROXICODONE ) 5 MG immediate release tablet Take 1 tablet (5 mg total) by mouth every 4 (four) hours as needed for severe pain (pain score 7-10). 08/19/23   Rashid, Farhan, MD  polyethylene glycol (MIRALAX  / GLYCOLAX ) 17 g packet Take 17 g by mouth daily as needed for moderate constipation. 08/19/23   Rashid, Farhan, MD  potassium chloride  (KLOR-CON  M) 10 MEQ tablet Take 1 tablet (10 mEq total) by mouth daily as needed (when you take the lasix ). Patient taking differently: Take 10 mEq by mouth See admin instructions. Take 10 mEq by mouth once a day when taking Furosemide  03/01/23   [name], MD  rosuvastatin  (  CRESTOR ) 10 MG tablet Take 1 tablet (10 mg total) by mouth at bedtime. 03/01/23   [name], MD  sertraline  (ZOLOFT ) 50 MG tablet Take 50 mg by mouth at bedtime. 07/08/23   [provider]  SYSTANE ULTRA 0.4-0.3 % SOLN Place 1 drop into both eyes 2 (two) times daily.    [provider]  tiZANidine  (ZANAFLEX ) 4 MG tablet Take 4 mg by mouth every 6 (six) hours as needed for muscle spasms (if not taking Methocarbamol ).    [provider]  valsartan  (DIOVAN ) 40 MG tablet Take 40 mg by mouth at bedtime.    [provider]                                                                                                                                    Past Surgical History Past Surgical History:  Procedure Laterality Date   ABDOMINAL ADHESION SURGERY     ABDOMINAL HYSTERECTOMY     BREAST EXCISIONAL BIOPSY Left    BREAST REDUCTION SURGERY Bilateral 06/13/2019   Procedure: MAMMARY REDUCTION  (BREAST);  Surgeon: Elisabeth Craig RAMAN, MD;  Location: [facility] OR;  Service:  Plastics;  Laterality: Bilateral;   BREAST SURGERY Left    benign bx 1990   CARDIAC CATHETERIZATION  2007   no PCI   CATARACT EXTRACTION Bilateral    CHOLECYSTECTOMY     COLONOSCOPY     COLONOSCOPY W/ POLYPECTOMY     INCONTINENCE SURGERY     2007    JOINT REPLACEMENT Left 2012   left hip   REDUCTION MAMMAPLASTY     2021   REVERSE SHOULDER ARTHROPLASTY Left 05/28/2020   Procedure: REVERSE SHOULDER ARTHROPLASTY;  Surgeon: Sharl Selinda Dover, MD;  Location[facility] OR;  Service: Orthopedics;  Laterality: Left;  2.5 hrs   TONSILLECTOMY     TOTAL HIP ARTHROPLASTY Right 10/04/2012   Procedure: TOTAL HIP ARTHROPLASTY;  Surgeon: Maude LELON Right, MD;  Location: [facility] OR;  Service: Orthopedics;  Laterality: Right;   TOTAL SHOULDER REPLACEMENT Left    WRIST SURGERY Left    2012/ left wrist/ bone removed due to necrosis   Family History Family History  Problem Relation Age of Onset   Heart disease Mother    Hypertension Mother    Alzheimer's disease Mother    Colon cancer Mother    Kidney failure Mother    Arthritis Brother    Suicidality Brother    Colon polyps Brother    Breast cancer Maternal Aunt    Healthy Son    Esophageal cancer Neg Hx    Stomach cancer Neg Hx    Rectal cancer Neg Hx    Crohn's disease Neg Hx     Social History Social History   Tobacco Use   Smoking status: Former    Current packs/day: 0.00    Average packs/day: 0.3 packs/day for 34.0 years (11.2 ttl pk-yrs)    Types:  Cigarettes    Start date: 10/25/1983    Quit date: 10/24/2017    Years since quitting: 5.9    Passive exposure: Past (dad smoked)   Smokeless tobacco: Never   Tobacco comments:    quit smoking october 2019  Vaping Use   Vaping status: Former   Devices: tried - quited prior to 2019  Substance Use Topics   Alcohol  use: Yes    Alcohol /week: 0.0 standard drinks of alcohol     Comment: occasional- rarely   Drug use: Never   Allergies Sulfa antibiotics, Sulfonamide derivatives,  Alendronate sodium, Dilaudid  [hydromorphone  hcl], Molds & smuts, Pollen extract, and Wound dressing adhesive  Review of Systems Review of Systems  All other systems reviewed and are negative.   Physical Exam Vital Signs  I have reviewed the triage vital signs BP 108/69   Pulse 71   Temp 98.9 F (37.2 C) (Oral)   Resp 14   Ht 5' 7.2 (1.707 m)   Wt 62.6 kg   SpO2 100%   BMI 21.49 kg/m  Physical Exam Vitals and nursing note reviewed.  Constitutional:      General: She is not in acute distress.    Appearance: She is well-developed.  HENT:     Head: Normocephalic and atraumatic.     Mouth/Throat:     Comments: Dry oral mucosa  Eyes:     Pupils: Pupils are equal, round, and reactive to light.  Cardiovascular:     Rate and Rhythm: Normal rate and regular rhythm.     Heart sounds: No murmur heard. Pulmonary:     Effort: Tachypnea and respiratory distress present.     Breath sounds: Examination of the right-upper field reveals wheezing. Examination of the left-upper field reveals wheezing. Examination of the right-middle field reveals wheezing. Examination of the left-middle field reveals wheezing. Examination of the right-lower field reveals wheezing. Examination of the left-lower field reveals wheezing. Wheezing present.  Abdominal:     General: Abdomen is flat.     Palpations: Abdomen is soft.     Tenderness: There is no abdominal tenderness.  Musculoskeletal:        General: No tenderness.     Right lower leg: No edema.     Left lower leg: No edema.  Skin:    General: Skin is warm and dry.  Neurological:     General: No focal deficit present.     Mental Status: She is alert. Mental status is at baseline.  Psychiatric:        Mood and Affect: Mood normal.        Behavior: Behavior normal.     ED Results and Treatments Labs (all labs ordered are listed, but only abnormal results are displayed) Labs Reviewed  COMPREHENSIVE METABOLIC PANEL WITH GFR - Abnormal;  Notable for the following components:      Result Value   Glucose, Bld 108 (*)    BUN 28 (*)    Creatinine, Ser 2.01 (*)    Total Protein 5.9 (*)    Albumin 3.2 (*)    Alkaline Phosphatase 131 (*)    GFR, Estimated 26 (*)    All other components within normal limits  CBC WITH DIFFERENTIAL/PLATELET - Abnormal; Notable for the following components:   RBC 3.52 (*)    Hemoglobin 10.7 (*)    HCT 34.1 (*)    All other components within normal limits  I-STAT VENOUS BLOOD GAS, ED - Abnormal; Notable for the following components:   pO2,  Ven 63 (*)    Bicarbonate 28.5 (*)    HCT 31.0 (*)    Hemoglobin 10.5 (*)    All other components within normal limits  BRAIN NATRIURETIC PEPTIDE  TROPONIN I (HIGH SENSITIVITY)  TROPONIN I (HIGH SENSITIVITY)                                                                                                                          Radiology DG Chest Portable 1 View Result Date: 09/29/2023 EXAM: 1 VIEW(S) XRAY OF THE CHEST 09/29/2023 01:30:00 PM COMPARISON: 07/22/2023 CLINICAL HISTORY: weakness, SOB FINDINGS: LUNGS AND PLEURA: Lung hyperinflation. No focal pulmonary opacity. No pulmonary edema. No pleural effusion. No pneumothorax. HEART AND MEDIASTINUM: Atherosclerotic plaque. No acute abnormality of the cardiac and mediastinal silhouettes. BONES AND SOFT TISSUES: Left shoulder arthroplasty. No acute osseous abnormality. IMPRESSION: 1. No acute findings. 2. Lung hyperinflation with interstitial changes concerning for emphysema. . 3. Atherosclerotic plaque. Electronically signed by: Waddell Calk MD 09/29/2023 02:01 PM EDT RP Workstation: [id number]    Pertinent labs & imaging results that were available during my care of the patient were reviewed by me and considered in my medical decision making (see MDM for details).  Medications Ordered in ED Medications  sodium chloride  0.9 % bolus 1,000 mL (has no administration in time range)  ipratropium-albuterol   (DUONEB) 0.5-2.5 (3) MG/3ML nebulizer solution 3 mL (has no administration in time range)  sodium chloride  0.9 % bolus 500 mL (500 mLs Intravenous New Bag/Given 09/29/23 1311)  ipratropium-albuterol  (DUONEB) 0.5-2.5 (3) MG/3ML nebulizer solution 3 mL (3 mLs Nebulization Given 09/29/23 1311)  methylPREDNISolone  sodium succinate (SOLU-MEDROL ) 125 mg/2 mL injection 125 mg (125 mg Intravenous Given 09/29/23 1311)                                                                                                                                     Procedures Procedures  (including critical care time)  Medical Decision Making / ED Course   MDM:  68 year old female presenting to the emergency department shortness of breath.  Patient overall well-appearing, but mild respiratory distress, tachypnea and wheezing.  Not hypoxic on home oxygen .  Does appear dehydrated.  Labs concerning for acute kidney injury, suspect due to dehydration.  Less concern for CHF or cardiorenal cause given that the patient has normal BNP and clear chest x-ray without signs of acute CHF.  No fevers  to suggest sepsis and no clear infectious source.  Patient received breathing treatments with improvement in symptoms but remains dyspneic.  Also received steroids.  Given AKI and COPD exacerbation discussed with internal medicine team who will admit patient.      Additional history obtained: -Additional history obtained from ems -External records from outside source obtained and reviewed including: Chart review including previous notes, labs, imaging, consultation notes including prior notes    Lab Tests: -I ordered, reviewed, and interpreted labs.   The pertinent results include:   Labs Reviewed  COMPREHENSIVE METABOLIC PANEL WITH GFR - Abnormal; Notable for the following components:      Result Value   Glucose, Bld 108 (*)    BUN 28 (*)    Creatinine, Ser 2.01 (*)    Total Protein 5.9 (*)    Albumin 3.2 (*)    Alkaline  Phosphatase 131 (*)    GFR, Estimated 26 (*)    All other components within normal limits  CBC WITH DIFFERENTIAL/PLATELET - Abnormal; Notable for the following components:   RBC 3.52 (*)    Hemoglobin 10.7 (*)    HCT 34.1 (*)    All other components within normal limits  I-STAT VENOUS BLOOD GAS, ED - Abnormal; Notable for the following components:   pO2, Ven 63 (*)    Bicarbonate 28.5 (*)    HCT 31.0 (*)    Hemoglobin 10.5 (*)    All other components within normal limits  BRAIN NATRIURETIC PEPTIDE  TROPONIN I (HIGH SENSITIVITY)  TROPONIN I (HIGH SENSITIVITY)    Notable for AKI  EKG   EKG Interpretation Date/Time:  Wednesday September 29 2023 12:53:33 EDT Ventricular Rate:  78 PR Interval:  152 QRS Duration:  87 QT Interval:  409 QTC Calculation: 466 R Axis:   79  Text Interpretation: Sinus rhythm Confirmed by Francesca Fallow ([id number]) on 09/29/2023 1:36:45 PM         Imaging Studies ordered: I ordered imaging studies including CXR On my interpretation imaging demonstrates no acute process I independently visualized and interpreted imaging. I agree with the radiologist interpretation   Medicines ordered and prescription drug management: Meds ordered this encounter  Medications   sodium chloride  0.9 % bolus 500 mL   ipratropium-albuterol  (DUONEB) 0.5-2.5 (3) MG/3ML nebulizer solution 3 mL   methylPREDNISolone  sodium succinate (SOLU-MEDROL ) 125 mg/2 mL injection 125 mg    IV methylprednisolone  will be converted to either a q12h or q24h frequency with the same total daily dose (TDD).  Ordered Dose: 1 to 125 mg TDD; convert to: TDD q24h.  Ordered Dose: 126 to 250 mg TDD; convert to: TDD div q12h.  Ordered Dose: >250 mg TDD; DAW.   sodium chloride  0.9 % bolus 1,000 mL   ipratropium-albuterol  (DUONEB) 0.5-2.5 (3) MG/3ML nebulizer solution 3 mL    -I have reviewed the patients home medicines and have made adjustments as needed    Reevaluation: After the  interventions noted above, I reevaluated the patient and found that their symptoms have improved  Co morbidities that complicate the patient evaluation  Past Medical History:  Diagnosis Date   Allergy    Anemia    Anxiety    Anxiety and depression    related to caring for mother during terminal illness   Arthritis    Asthma    AVN (avascular necrosis of bone) (HCC)    hip and wrist   Bronchitis    hx of   Cataract    Chronic kidney  disease    COPD (chronic obstructive pulmonary disease) (HCC)    Depression    Emphysema of lung (HCC)    Endometriosis    FH: CAD (coronary artery disease)    GERD (gastroesophageal reflux disease)    ocassional   History of blood transfusion    after hip repackment - broke out in hives and started itching   History of palpitations    Hyperlipidemia    Hypertension    Hypothyroid    Neuromuscular disorder (HCC)    Non-ischemic cardiomyopathy (HCC) 2019   Osteopenia    forteo through Dr. Dolphus (started 10/12)   Osteoporosis    PMR (polymyalgia rheumatica)    SIRS (systemic inflammatory response syndrome) (HCC) 10/2017   Smoker    SOB (shortness of breath) on exertion    Squamous cell carcinoma    facial, 2016   Temporal arteritis (HCC)    s/p prednisone  taper      Dispostion: Disposition decision including need for hospitalization was considered, and patient admitted to the hospital.    Final Clinical Impression(s) / ED Diagnoses Final diagnoses:  COPD exacerbation (HCC)  AKI (acute kidney injury)     This chart was dictated using voice recognition software.  Despite best efforts to proofread,  errors can occur which can change the documentation meaning.    Francesca Elsie CROME, MD 09/29/23 [phone]

## 2023-09-29 NOTE — Telephone Encounter (Signed)
 Pt insurance is active and benefits verified through [facility] [employer]. Co-pay $20, DED $0/$0 met, out of pocket $3,900/$3,900 met, co-insurance 0%. No pre-authorization required. 09/29/2023 @ 8:48am, spoke with Ruthanna BRAVO., REF# [id number].

## 2023-09-29 NOTE — H&P (Cosign Needed)
 Date: 09/29/2023               Patient Name:  April [name] MRN: [id number]  DOB: 11-16-1955 Age / Sex: 68 y.o., female   PCP: Tonnie Jon SAILOR, NP         Medical Service: Internal Medicine Teaching Service         Attending Physician: Dr. MICAEL Riis Winfrey      First Contact: Sallyanne Primas, DO}    Second Contact: Dr. Toma Edwards, DO         Pager Information: First Contact Pager: [phone]   Second Contact Pager: [phone]   SUBJECTIVE   Chief Complaint: Shortness of breath  History of Present Illness: April [name] is a 68 y.o. female with PMH of COPD, chronic hypoxic respiratory failure, CHF, CKD, HTN, HLD, PSP.   Presents with shortness of breath. Symptoms began this morning when she woke up and was unable to catch her breath. She recently had a COPD exacerbation 1 month ago and started on azithromycin  3x/week and 2L home O2. Stated that prior COPD exacerbations generally come on quickly, without many indicators. In the days leading to presentation, she states that she has had an improving cough, and an increased production of white sputum. She normally takes Breztri  inhaler BID for maintenance with an albuterol  rescue inhaler that she uses once per day. She is supposed to start Nucala  treatment starting next month. She has not taken any of her medications today, including both of her inhalers. After treatment in the ED, she reports initially feeling symptomatically worse, but then improved. She has been experiencing an increased amount of nasal congestion after starting home O2. She denies wheezing, fevers, chills, chest pain, sick contacts, recent travel.   She has had chronic bilateral leg swelling controlled with Lasix , but in the past few days, she has experienced a significant increase in swelling. She was advised to double her Lasix  dose, but has does not think it has changed much.   Endorses decreased urine output. She fell out of her bed a couple weeks ago resulting  in significant bruising and 2 lacerations on her legs that are healing. Denies nausea, vomiting, sore throat, abdominal pain   ED Course: Labs significant for  - BUN 28, Cr 2.01, eGFR 26 - BNP 8.8, Trop 5 - Hgb 10.5 - RPP pending Imaging  - CXR: No acute findings, emphysema Received  - NS 1.5L bolus - Solumedrol 125 mg - Duoneb Consulted ITMS  Meds:  Patient reported:  Azithromycin  250 mg MWF Albuterol  PRN - uses daily Breztri  BID Calcium  - 500 mg every day   Carvidopa-levodopa  25-100 TID  Vit D 125 mcg daily  Vit B12 daily  Ensure 1 bottle every day  Furosemide  20 mg PRN - 20 mg BID over the past 3 days for increased swelling Levothyroxine  88 mcg daily  Methocarbomol 750 mg PRN Metoprolol  succinate 25 mg daily  Omeprazole  20 mg daily  Oxycodone  5 mg PRN Miralax  PRN  KCl - take with Lasix  Crestor  10 mg daily  Sertraline  50 mg daily  Sysstane eye drops  daily  Valsartan  40 mg daily   DID NOT TAKEN ANY MEDS THIS MORNING. Will take tylenol  for pain   No outpatient medications have been marked as taking for the 09/29/23 encounter [facility] Encounter).    Past Medical History Hypothyroidism  Major depressive disorder   Asthma  Polymyalgia rheyumatica  HTN  Vertigo  CKD stage III  COPD  Osteoporosis Chronic  Diastolic CHF Hyperlipidemia  Bilateral lower extremity edema (very rare) PSP (progressive supranuclear palsy)  Past Surgical History Past Surgical History:  Procedure Laterality Date   ABDOMINAL ADHESION SURGERY     ABDOMINAL HYSTERECTOMY     BREAST EXCISIONAL BIOPSY Left    BREAST REDUCTION SURGERY Bilateral 06/13/2019   Procedure: MAMMARY REDUCTION  (BREAST);  Surgeon: Elisabeth Craig RAMAN, MD;  Location: [facility] OR;  Service: Plastics;  Laterality: Bilateral;   BREAST SURGERY Left    benign bx 1990   CARDIAC CATHETERIZATION  2007   no PCI   CATARACT EXTRACTION Bilateral    CHOLECYSTECTOMY     COLONOSCOPY     COLONOSCOPY W/ POLYPECTOMY      INCONTINENCE SURGERY     2007    JOINT REPLACEMENT Left 2012   left hip   REDUCTION MAMMAPLASTY     2021   REVERSE SHOULDER ARTHROPLASTY Left 05/28/2020   Procedure: REVERSE SHOULDER ARTHROPLASTY;  Surgeon: Sharl Selinda Dover, MD;  Location: [facility] OR;  Service: Orthopedics;  Laterality: Left;  2.5 hrs   TONSILLECTOMY     TOTAL HIP ARTHROPLASTY Right 10/04/2012   Procedure: TOTAL HIP ARTHROPLASTY;  Surgeon: Maude LELON Right, MD;  Location: [facility] OR;  Service: Orthopedics;  Laterality: Right;   TOTAL SHOULDER REPLACEMENT Left    WRIST SURGERY Left    2012/ left wrist/ bone removed due to necrosis   Social:  Lives With: lives alone in a facility  Occupation: retired  Support: Good friend support, low family support Level of Function: uses a [employer] administrator; can shower, walk, cooking PCP:  Tonnie Jon SAILOR, NP  Substances: -Tobacco: former smoker, stopped 7 years ago. <1 pk a day. 20 years -Alcohol : occasional (never heavily)  -Recreational Drug: never   Family History:  Family History  Problem Relation Age of Onset   Heart disease Mother    Hypertension Mother    Alzheimer's disease Mother    Colon cancer Mother    Kidney failure Mother    Arthritis Brother    Suicidality Brother    Colon polyps Brother    Breast cancer Maternal Aunt    Healthy Son    Esophageal cancer Neg Hx    Stomach cancer Neg Hx    Rectal cancer Neg Hx    Crohn's disease Neg Hx      Allergies: Allergies as of 09/29/2023 - Review Complete 09/29/2023  Allergen Reaction Noted   Sulfa antibiotics Anaphylaxis, Hives, and Dermatitis 11/06/2021   Sulfonamide derivatives Anaphylaxis, Dermatitis, Rash, and Other (See Comments)    Alendronate sodium Other (See Comments) 04/30/2010   Dilaudid  [hydromorphone  hcl] Nausea And Vomiting 09/29/2012   Molds & smuts Itching and Other (See Comments) 08/16/2023   Pollen extract Itching and Other (See Comments) 08/16/2023   Wound dressing adhesive Other (See  Comments) 05/28/2020    Review of Systems: A complete ROS was negative except as per HPI.   OBJECTIVE:   Physical Exam: Blood pressure 99/63, pulse 74, temperature 98.9 F (37.2 C), temperature source Oral, resp. rate 14, height 5' 7.2 (1.707 m), weight 62.6 kg, SpO2 99%.  Constitutional: alert in no acute distress HENT: normocephalic atraumatic, mucous membranes moist, eyes PERRL Cardiovascular: regular rate and rhythm, no m/r/g Pulmonary/Chest: Normal effort of breathing with diffuse expiratory wheezes and prolonged expiratory phase.  Abdominal: soft, non-tender, non-distended Extremities: B/L LE 1+ and 2+ pedal pitting edema. Purpuric lesions on b/l LE. Two 5 cm healing lacerations on RLE.  Neurological: alert & oriented x 3, 5/5  strength in lower extremities Skin: cool and dry  Labs: CBC    Component Value Date/Time   WBC 6.6 09/29/2023 1304   RBC 3.52 (L) 09/29/2023 1304   HGB 10.5 (L) 09/29/2023 1325   HGB 11.5 08/31/2023 1211   HCT 31.0 (L) 09/29/2023 1325   HCT 35.9 08/31/2023 1211   PLT 201 09/29/2023 1304   PLT 262 08/31/2023 1211   MCV 96.9 09/29/2023 1304   MCV 98 (H) 08/31/2023 1211   MCH 30.4 09/29/2023 1304   MCHC 31.4 09/29/2023 1304   RDW 14.6 09/29/2023 1304   RDW 13.3 08/31/2023 1211   LYMPHSABS 0.7 09/29/2023 1304   LYMPHSABS 0.5 (L) 08/31/2023 1211   MONOABS 0.5 09/29/2023 1304   EOSABS 0.3 09/29/2023 1304   EOSABS 0.1 08/31/2023 1211   BASOSABS 0.1 09/29/2023 1304   BASOSABS 0.0 08/31/2023 1211     CMP     Component Value Date/Time   NA 138 09/29/2023 1325   NA 138 01/31/2021 1029   K 4.2 09/29/2023 1325   CL 101 09/29/2023 1304   CO2 26 09/29/2023 1304   GLUCOSE 108 (H) 09/29/2023 1304   BUN 28 (H) 09/29/2023 1304   BUN 15 01/31/2021 1029   CREATININE 2.01 (H) 09/29/2023 1304   CREATININE 1.13 (H) 06/03/2016 1213   CALCIUM  9.1 09/29/2023 1304   PROT 5.9 (L) 09/29/2023 1304   PROT 6.8 05/20/2021 1154   ALBUMIN 3.2 (L) 09/29/2023  1304   ALBUMIN 4.0 03/10/2022 1059   AST 21 09/29/2023 1304   ALT 16 09/29/2023 1304   ALKPHOS 131 (H) 09/29/2023 1304   BILITOT 0.9 09/29/2023 1304   BILITOT 0.4 05/20/2021 1154   GFRNONAA 26 (L) 09/29/2023 1304   GFRNONAA 52 (L) 06/03/2016 1213   GFRAA 50 (L) 06/13/2019 0955   GFRAA 60 06/03/2016 1213    Imaging:  DG Chest Portable 1 View Result Date: 09/29/2023 EXAM: 1 VIEW(S) XRAY OF THE CHEST 09/29/2023 01:30:00 PM COMPARISON: 07/22/2023 CLINICAL HISTORY: weakness, SOB FINDINGS: LUNGS AND PLEURA: Lung hyperinflation. No focal pulmonary opacity. No pulmonary edema. No pleural effusion. No pneumothorax. HEART AND MEDIASTINUM: Atherosclerotic plaque. No acute abnormality of the cardiac and mediastinal silhouettes. BONES AND SOFT TISSUES: Left shoulder arthroplasty. No acute osseous abnormality. IMPRESSION: 1. No acute findings. 2. Lung hyperinflation with interstitial changes concerning for emphysema. . 3. Atherosclerotic plaque. Electronically signed by: Waddell Calk MD 09/29/2023 02:01 PM EDT RP Workstation: [id number]     EKG: personally reviewed my interpretation is sinus rhythm. Prior EKG sinus rhythm.  ASSESSMENT & PLAN:   Assessment & Plan by Problem: Principal Problem:   COPD with acute exacerbation (HCC) Active Problems:   Essential hypertension   CKD (chronic kidney disease), stage III (HCC)   COPD, severe (HCC)   Chronic diastolic CHF (congestive heart failure) (HCC)   April [name] is a 68 y.o. person living with a history of COPD, chronic hypoxic respiratory failure, CHF, CKD, HTN, HLD, PSP who presented with shortness of breath and admitted for COPD exacerbation and AKI on hospital day 0  Acute Hypoxic Respiratory failure likely 2/2 COPD Exacerbation Presented with SOB. COPD home medications include Breztri  inhaler BID and albuterol  that is used once per day. Last PFT December 2024 (post FEV1/FVC 47, obstructive pattern). Recently had an exacerbation, after  which she was started on prophylactic azithromycin  and 2L home oxygen . She had an increase in sputum production despite an improving cough prior to presentation. In no respiratory distress. Wheezing auscultated across all  lung fields. SpO2 > 92% on room air. CXR showed no acute process, hyperinflation with flattened diaphragms, consistent with COPD. Gold Group-E classification due to recurrent exacerbations. SOB likely due to COPD exacerbation. Other considerations include heart failure due to CHF history and acutely increased leg edema or PE. However, she has no chest pain, no recent travel; CXR, BNP negative for HF exacerbation and has had clinical improvement with breathing treatment. RPP for possible infectious etiology pending.   - Duoneb scheduled q4 hrs PRN - Azithromycin  250 mg MWF ordered per home medication - Breztri  inhaler ordered BID per home medication - Albuterol  inhaler ordered PRN per home medication - Prednisone  40 mg for 5 total days ordered (9/24-9/28) - RPP pending - Tylenol  PRN  AKI CKD Pt with history of CKD had labs on presentation concerning for AKI. She has been experiencing an acute exacerbation of lower extremity swelling for the past few days prompting an increase in furosemide  from 20 mg to 40 mg. She endorsed decreased urine output despite increasing her furosemide  dose. Cr today 2.01, baseline is around 1.0. GFR decreased to 26 from baseline around 50. She received 1.5L NS while in the ED. Etiology possibly due to dehydration from increased furosemide  usage. Rehydrate with sodium chloride  bolus, hold furosemide  for now. Can restart to clear LE edema after rehydration. - Collect UA - Bladder scan - Hold lasix  for now - monitor BMP  HFimpEF HTN Echo from 2019 demonstrated LV 40-45%. Echo 07/24/2023 EF 50-55% with Left Ventricle low normal function, trivial pericardial effusion present. Home medications include metoprolol  25 mg and valsartan  40 mg and lasix  20 mg PRN +  Kcl. Presented to ED with SOB, CXR did not show pleural effusion or vascular congestion. BNP WNL. Having worsening LE and pedal edema without resolution with lasix . Unlikely contributing to current symptoms.  -holding home BP medications  -holding use of lasix  for LE edema in setting of AKI.   Hypothyroidism  -Continue levothyroxine  88 mcg  Major depressive disorder   -Continue sertraline  50 mg   PSP (progressive supranuclear palsy) -Continue carbidopa -levodopa  25-100 mg  Hyperlipidemia  -Continue Crestor  10 mg  Other chronic medications ordered:  -Sysstane eye drops daily  -Miralax  PRN -pantoprazole  40 mg daily -methocarbomol 750 mg PRN -calcium  carbonate  -Vit D -Ensure   Best practice: Diet: Normal VTE: Enoxaparin30 mg IVF: sodium chloride  1 L and 500 ml,Bolus Code: Full  Disposition planning: Prior to Admission Living Arrangement: Home, living at [facility] Anticipated Discharge Location: [place]  Dispo: Admit patient to Observation with expected length of stay less than 2 midnights.  Signed: Benuel Braun, DO Internal Medicine Resident  09/29/2023, 7:05 PM  On Call pager: [phone]

## 2023-09-29 NOTE — ED Triage Notes (Signed)
 BIB EMS from [facility] r/t increased weakness and SOB the last few days. Wheezing. Denies N/V/D. A&Ox4. Hypotension for EMS 60/40. EMS administerd LR with improvement.

## 2023-09-29 NOTE — ED Notes (Signed)
 Got patient into a gown on the monitor did EKG shown to er provider patient is resting with call bell in reach

## 2023-09-29 NOTE — Hospital Course (Signed)
 ED sign out: P/w: COPD exacerbation, dehydration, weaker for 2 days and not drinking fluids, generalized weakness. Home 2L Clear X ray  Steroids and breathing treatment Hypotension

## 2023-09-29 NOTE — Telephone Encounter (Signed)
 Attempted to call patient to schedule pulmonary rehab- no answer, unable to leave message as voicemail is full. Sent MyChart message.

## 2023-09-30 ENCOUNTER — Other Ambulatory Visit

## 2023-09-30 ENCOUNTER — Other Ambulatory Visit (HOSPITAL_COMMUNITY): Payer: Self-pay

## 2023-09-30 DIAGNOSIS — R296 Repeated falls: Secondary | ICD-10-CM | POA: Diagnosis not present

## 2023-09-30 DIAGNOSIS — Z79899 Other long term (current) drug therapy: Secondary | ICD-10-CM | POA: Diagnosis not present

## 2023-09-30 DIAGNOSIS — Z7951 Long term (current) use of inhaled steroids: Secondary | ICD-10-CM | POA: Diagnosis not present

## 2023-09-30 DIAGNOSIS — N179 Acute kidney failure, unspecified: Secondary | ICD-10-CM | POA: Diagnosis not present

## 2023-09-30 DIAGNOSIS — J441 Chronic obstructive pulmonary disease with (acute) exacerbation: Secondary | ICD-10-CM | POA: Diagnosis not present

## 2023-09-30 DIAGNOSIS — W06XXXA Fall from bed, initial encounter: Secondary | ICD-10-CM

## 2023-09-30 DIAGNOSIS — S81809A Unspecified open wound, unspecified lower leg, initial encounter: Secondary | ICD-10-CM

## 2023-09-30 DIAGNOSIS — S81801A Unspecified open wound, right lower leg, initial encounter: Secondary | ICD-10-CM

## 2023-09-30 LAB — CBC
HCT: 31.3 % — ABNORMAL LOW (ref 36.0–46.0)
Hemoglobin: 9.9 g/dL — ABNORMAL LOW (ref 12.0–15.0)
MCH: 30.4 pg (ref 26.0–34.0)
MCHC: 31.6 g/dL (ref 30.0–36.0)
MCV: 96 fL (ref 80.0–100.0)
Platelets: 175 K/uL (ref 150–400)
RBC: 3.26 MIL/uL — ABNORMAL LOW (ref 3.87–5.11)
RDW: 14.2 % (ref 11.5–15.5)
WBC: 4 K/uL (ref 4.0–10.5)
nRBC: 0 % (ref 0.0–0.2)

## 2023-09-30 LAB — BASIC METABOLIC PANEL WITH GFR
Anion gap: 8 (ref 5–15)
BUN: 27 mg/dL — ABNORMAL HIGH (ref 8–23)
CO2: 23 mmol/L (ref 22–32)
Calcium: 8.6 mg/dL — ABNORMAL LOW (ref 8.9–10.3)
Chloride: 106 mmol/L (ref 98–111)
Creatinine, Ser: 1.58 mg/dL — ABNORMAL HIGH (ref 0.44–1.00)
GFR, Estimated: 35 mL/min — ABNORMAL LOW (ref >60)
Glucose, Bld: 129 mg/dL — ABNORMAL HIGH (ref 70–99)
Potassium: 4.1 mmol/L (ref 3.5–5.1)
Sodium: 137 mmol/L (ref 135–145)

## 2023-09-30 LAB — URINALYSIS, ROUTINE W REFLEX MICROSCOPIC
Bilirubin Urine: NEGATIVE
Glucose, UA: NEGATIVE mg/dL
Hgb urine dipstick: NEGATIVE
Ketones, ur: NEGATIVE mg/dL
Leukocytes,Ua: NEGATIVE
Nitrite: NEGATIVE
Protein, ur: NEGATIVE mg/dL
Specific Gravity, Urine: 1.014 (ref 1.005–1.030)
pH: 5 (ref 5.0–8.0)

## 2023-09-30 MED ORDER — AZITHROMYCIN 250 MG PO TABS
250.0000 mg | ORAL_TABLET | ORAL | 3 refills | Status: DC
  Filled 2023-09-30: qty 36, 84d supply, fill #0

## 2023-09-30 MED ORDER — CALCIUM 500 MG PO CHEW: 500.0000 mg | CHEWABLE_TABLET | Freq: Two times a day (BID) | ORAL | 0 refills | Status: DC

## 2023-09-30 MED ORDER — POLYETHYLENE GLYCOL 3350 17 G PO PACK: 17.0000 g | PACK | Freq: Every day | ORAL | Status: AC | PRN

## 2023-09-30 MED ORDER — PREDNISONE 20 MG PO TABS
40.0000 mg | ORAL_TABLET | Freq: Every day | ORAL | 0 refills | Status: AC
  Filled 2023-09-30: qty 8, 4d supply, fill #0

## 2023-09-30 MED ORDER — SYSTANE ULTRA 0.4-0.3 % OP SOLN: 1.0000 [drp] | Freq: Two times a day (BID) | OPHTHALMIC | 0 refills | Status: AC

## 2023-09-30 NOTE — Progress Notes (Incomplete)
 HD#0 SUBJECTIVE:  Patient Summary: April [name] is a 68 y.o. with a pertinent PMH of COPD, CHF, CKD, HTN, HLD who presented with SOB and hypotension and admitted for acute hypoxic respiratory failure 2/2 COPD exacerbation.   Overnight Events: No overnight events  Interim History: Afebrile, respiratory rate 16, heart rate 87, BP 113/71, 97% on 2 L  OBJECTIVE:  Vital Signs: Vitals:   09/29/23 2100 09/30/23 0448 09/30/23 0500 09/30/23 0627  BP:  113/71    Pulse:  87    Resp:  16    Temp:  98.2 F (36.8 C)    TempSrc:  Oral    SpO2: 97% 97%  95%  Weight:   72.8 kg   Height:       Supplemental O2: Nasal Cannula SpO2: 95 % O2 Flow Rate (L/min): 2 L/min  Filed Weights   09/29/23 1247 09/30/23 0500  Weight: 62.6 kg 72.8 kg     Intake/Output Summary (Last 24 hours) at 09/30/2023 0756 Last data filed at 09/30/2023 0400 Gross per 24 hour  Intake 650 ml  Output 500 ml  Net 150 ml   Net IO Since Admission: 150 mL [09/30/23 0756]  Physical Exam: Physical Exam  Patient Lines/Drains/Airways Status     Active Line/Drains/Airways     Name Placement date Placement time Site Days   Peripheral IV 09/29/23 18 G Anterior;Left;Proximal Forearm 09/29/23  --  Forearm  1   Wound 09/29/23 2000 Traumatic Pretibial Right 09/29/23  2000  Pretibial  1   Wound 09/29/23 2000 Traumatic Pretibial Distal;Right 09/29/23  2000  Pretibial  1            Pertinent labs and imaging:      Latest Ref Rng & Units 09/30/2023    2:06 AM 09/29/2023    1:25 PM 09/29/2023    1:04 PM  CBC  WBC 4.0 - 10.5 K/uL 4.0   6.6   Hemoglobin 12.0 - 15.0 g/dL 9.9  89.4  89.2   Hematocrit 36.0 - 46.0 % 31.3  31.0  34.1   Platelets 150 - 400 K/uL 175   201        Latest Ref Rng & Units 09/30/2023    2:06 AM 09/29/2023    1:25 PM 09/29/2023    1:04 PM  CMP  Glucose 70 - 99 mg/dL 870   891   BUN 8 - 23 mg/dL 27   28   Creatinine 9.55 - 1.00 mg/dL 8.41   7.98   Sodium 864 - 145 mmol/L 137  138  137    Potassium 3.5 - 5.1 mmol/L 4.1  4.2  4.0   Chloride 98 - 111 mmol/L 106   101   CO2 22 - 32 mmol/L 23   26   Calcium  8.9 - 10.3 mg/dL 8.6   9.1   Total Protein 6.5 - 8.1 g/dL   5.9   Total Bilirubin 0.0 - 1.2 mg/dL   0.9   Alkaline Phos 38 - 126 U/L   131   AST 15 - 41 U/L   21   ALT 0 - 44 U/L   16     DG Chest Portable 1 View Result Date: 09/29/2023 EXAM: 1 VIEW(S) XRAY OF THE CHEST 09/29/2023 01:30:00 PM COMPARISON: 07/22/2023 CLINICAL HISTORY: weakness, SOB FINDINGS: LUNGS AND PLEURA: Lung hyperinflation. No focal pulmonary opacity. No pulmonary edema. No pleural effusion. No pneumothorax. HEART AND MEDIASTINUM: Atherosclerotic plaque. No acute abnormality of the cardiac and mediastinal  silhouettes. BONES AND SOFT TISSUES: Left shoulder arthroplasty. No acute osseous abnormality. IMPRESSION: 1. No acute findings. 2. Lung hyperinflation with interstitial changes concerning for emphysema. . 3. Atherosclerotic plaque. Electronically signed by: Waddell Calk MD 09/29/2023 02:01 PM EDT RP Workstation: [id number]    ASSESSMENT/PLAN:  Assessment: Principal Problem:   COPD with acute exacerbation (HCC) Active Problems:   Essential hypertension   CKD (chronic kidney disease), stage III (HCC)   COPD, severe (HCC)   Chronic diastolic CHF (congestive heart failure) (HCC)   Plan: Acute hypoxic respiratory failure likely 2/2 COPD exacerbation Saturating 95% on room air. Respiratory panel negative. Based on physical exam - DuoNeb - Azithromycin  to 250 mg MWF - Breztri  twice daily ordered - albuterol  PRN - order albuterol  nebulizer outpatient - goal 88-92% O2 saturation  - ambulatory pulse ox - prednisone  40 mg for 5 days - DuoNeb every 4 as needed ordered  AKI CKD Creatinine baseline appears to be around 1.0.  Creatinine decreased to 1.58 from 2.01 with fluids.  GFR 35 (increased from 26).  Endorsed decrease in urination during admission: Bladder scan revealed 116 mL.  UA  unremarkable. - Continue to monitor outpatient  HRimpEF (EF 50 to 55%) HTN Medications include metoprolol  25 mg and valsartan  40 mg daily.  Lasix  20 mg as needed plus KCl.  Held in the setting of AKI and with admission of hypotension.  Bilateral lower extremity/pedal edema.  BP 113/71 -Continue holding BP medications -recommend compression socks   Hypothyroidism - Continue levothyroxine  88 mcg  Major depressive disorder - Continue sertraline  50 mg  PSP - Continue carbidopa  levodopa  25-100 mg  Hyperlipidemia - Continue Crestor  10 mg  Other chronic medications ordered: -Sysstane eye drops daily  -Miralax  PRN -pantoprazole  40 mg daily -methocarbomol 750 mg PRN -calcium  carbonate  -Vit D -Ensure   Best Practice: Diet: {CHL DISCHARGE DIET:21201} IVF: Fluids: {Meds; iv fluids:31617}, Rate: {NAMES:3044014::None,*** cc/hr x *** hrs,*** cc bolus} VTE: enoxaparin  (LOVENOX ) injection 30 mg Start: 09/29/23 1700 Code: {NAMES:3044014::Full,DNR,DNI,DNR/DNI,Comfort Care,Unknown}  Disposition planning: Therapy Recs: {NAMES:3044014::None,Pending,CIR,SNF,ALF,LTAC,Home Health}, DME: {Assistive Devices IFZ:80464} Family Contact: ***, {Family:[id number]::to be notified.} DISPO: Anticipated discharge {NAMES:3044014::today,tomorrow,in *** days} to {Discharge Destination:18313::Home} pending {BARRIERS TO DISCHARGE:24135}.  Signature:  Sallyanne Benuel [facility]  7:56 AM, 09/30/2023  On Call pager [phone]

## 2023-09-30 NOTE — Evaluation (Signed)
 Occupational Therapy Evaluation Patient Details Name: April [name] MRN: [id number] DOB: [birth year] Today's Date: 09/30/2023   History of Present Illness   Pt is a 68 y.o female presenting to [facility] on 9/24 with weakness and shortness of breath. Pt arrived from [facility]. Pt found to by hypotensive that improved with fluids. PMH: CHF, COPD, HTN, asthma, hyperlipidemia, and L RTSA (2022)     Clinical Impressions Pt resides at [facility] independent living facility. Pt receives support from care aide 3 days/wk in cooking and cleaning. Pt reports that she receives home health PT and OT. Pt primarily utilizes manual wheel chair for functional mobility and requires assistance with transfers. Pt is currently limited by decreased activity tolerance and endurance secondary to COPD. Pt required Min. A +1 for step pivot transfer with RW from bed to recliner. Pt required Min-Mod. A for LB ADLs this session. Pt educated on energy conservation strategies. Pt able to verbalize understanding of strategies but required Min. Verbal cues during ambulation to utilize pursed lip breathing. Pt will benefit from continued skilled OT services in acute and post acute care to maximize functional abilities and decrease burden of care. OT to recommend continued HHOT upon discharge.      If plan is discharge home, recommend the following:   A little help with walking and/or transfers;A little help with bathing/dressing/bathroom;Assistance with cooking/housework;Assist for transportation     Functional Status Assessment   Patient has had a recent decline in their functional status and demonstrates the ability to make significant improvements in function in a reasonable and predictable amount of time.     Equipment Recommendations   None recommended by OT     Recommendations for Other Services         Precautions/Restrictions   Precautions Precautions: None Restrictions Weight Bearing  Restrictions Per Provider Order: No     Mobility Bed Mobility Overal bed mobility: Independent                  Transfers Overall transfer level: Needs assistance Equipment used: Rolling walker (2 wheels) Transfers: Sit to/from Stand, Bed to chair/wheelchair/BSC Sit to Stand: Min assist     Step pivot transfers: Min assist     General transfer comment: Pt required verbal cues to widen base of support during transfer and to not hold her breath when transferring. Pt able to maintain SpO2 levels between 88 and 91 during functional ambulation and engagemnt in ADL routine.      Balance Overall balance assessment: Needs assistance Sitting-balance support: Feet supported, No upper extremity supported Sitting balance-Leahy Scale: Good     Standing balance support: Bilateral upper extremity supported, Reliant on assistive device for balance Standing balance-Leahy Scale: Fair                             ADL either performed or assessed with clinical judgement   ADL Overall ADL's : Needs assistance/impaired Eating/Feeding: Set up   Grooming: Set up   Upper Body Bathing: Set up   Lower Body Bathing: Minimal assistance   Upper Body Dressing : Set up   Lower Body Dressing: Minimal assistance Lower Body Dressing Details (indicate cue type and reason): Min A and increased time     Toileting- Clothing Manipulation and Hygiene: Moderate assistance;Sit to/from stand Toileting - Clothing Manipulation Details (indicate cue type and reason): Mod A for anterior and posterior peri care in standing. Pt required Min A. to don/doff  LB clothing.     Functional mobility during ADLs: Minimal assistance;Rolling walker (2 wheels) General ADL Comments: Pt engaged in LB and UB dressing this session. Pt required Min A for LB donning/doffing clothes and set-up for UB dressing. Pt required Mod. A for peri care in standing with RW.     Vision Baseline Vision/History: 1 Wears  glasses Patient Visual Report: No change from baseline Vision Assessment?: No apparent visual deficits     Perception Perception: Within Functional Limits       Praxis         Pertinent Vitals/Pain Pain Assessment Pain Assessment: Faces Faces Pain Scale: Hurts a little bit Pain Location: headache and L knee Pain Descriptors / Indicators: Headache Pain Intervention(s): Monitored during session, Limited activity within patient's tolerance     Extremity/Trunk Assessment Upper Extremity Assessment Upper Extremity Assessment: LUE deficits/detail LUE Deficits / Details: Decreased ROM LUE secondary to previous RTSA. Able to flex shoulder to 90 degrees. BUE functional for tasks assessed this session. Pt left handed. LUE Sensation: WNL LUE Coordination: WNL   Lower Extremity Assessment Lower Extremity Assessment: Defer to PT evaluation   Cervical / Trunk Assessment Cervical / Trunk Assessment: Normal   Communication Communication Communication: No apparent difficulties   Cognition Arousal: Alert Behavior During Therapy: WFL for tasks assessed/performed Cognition: No apparent impairments                               Following commands: Intact       Cueing  General Comments   Cueing Techniques: Verbal cues      Exercises     Shoulder Instructions      Home Living Family/patient expects to be discharged to:: Other (Comment) (Independent Living)                             Home Equipment: Wheelchair - manual;Shower seat - built in;Grab bars - tub/shower;Grab bars - toilet;Hand held Stage manager (4 wheels);BSC/3in1;Cane - quad   Additional Comments: Supplements independent living with aide. Pt reported assistance will stop in everyday to do a check-in but will come in 3 days a week to complete household tasks like cleaning and food rpeparation.      Prior Functioning/Environment Prior Level of Function : Needs assist        Physical Assist : Mobility (physical);ADLs (physical) Mobility (physical): Transfers;Gait ADLs (physical): IADLs;Dressing;Bathing;Toileting Mobility Comments: uses wheelchair primarly and receives assistance to transfer in and out of wheelchair ADLs Comments: Has been in wheelchair since August, receives PT and OT through [facility] at ALF. Care assists with LB ADLs, cooking, cleaning.    OT Problem List: Decreased strength;Decreased activity tolerance;Cardiopulmonary status limiting activity   OT Treatment/Interventions: Self-care/ADL training;Therapeutic exercise;DME and/or AE instruction;Energy conservation;Therapeutic activities;Balance training      OT Goals(Current goals can be found in the care plan section)   Acute Rehab OT Goals Patient Stated Goal: To get better OT Goal Formulation: With patient Time For Goal Achievement: 10/14/23 Potential to Achieve Goals: Good ADL Goals Pt Will Perform Lower Body Bathing: with modified independence;with adaptive equipment;sitting/lateral leans Pt Will Perform Lower Body Dressing: with modified independence;sitting/lateral leans;with adaptive equipment Pt Will Transfer to Toilet: with modified independence Additional ADL Goal #1: Pt will verbalize and return demonstration of energy conservation strategies during ADL routine.   OT Frequency:  Min 2X/week    Co-evaluation  AM-PAC OT 6 Clicks Daily Activity     Outcome Measure Help from another person eating meals?: None Help from another person taking care of personal grooming?: None Help from another person toileting, which includes using toliet, bedpan, or urinal?: A Little Help from another person bathing (including washing, rinsing, drying)?: A Little Help from another person to put on and taking off regular upper body clothing?: None Help from another person to put on and taking off regular lower body clothing?: A Little 6 Click Score: 21   End of Session  Equipment Utilized During Treatment: Gait belt;Rolling walker (2 wheels)  Activity Tolerance: Patient tolerated treatment well Patient left: in chair;with call bell/phone within reach;with chair alarm set  OT Visit Diagnosis: Unsteadiness on feet (R26.81);Muscle weakness (generalized) (M62.81)                Time: 9140-9064 OT Time Calculation (min): 36 min Charges:  OT General Charges $OT Visit: 1 Visit OT Evaluation $OT Eval Moderate Complexity: 1 Mod OT Treatments $Self Care/Home Management : 8-22 mins  Maurilio Peek, OTR/L.  [facility] Acute Rehabilitation  Office: [phone]   Maurilio PARAS Hadlie Gipson 09/30/2023, 11:28 AM

## 2023-09-30 NOTE — Discharge Instructions (Signed)
  Thank you for allowing us  to be part of your care. You were hospitalized for COPD exacerbation and acute kidney injury from dehydration we treated you with breathing treatments and hydration.  See the changes in your medications and management of your chronic conditions below:  *For your COPD  -We have STARTED you on these following medications:            -Prednisone  40 mg daily until 9/29            - Albuterol  nebulizer -CONTINUE             - Breztri              - Albuterol  as needed             - Azithromycin  250 mg Monday, Wednesday, Friday             - Follow-up with your pulmonologist - If your oxygen  drops to 88% while walking, that is okay.  Anything less than that, you may utilize her home oxygen  and talk with your PCP or pulmonologist.  *For your acute kidney injury -Ensure adequate hydration at home  *For your wounds - We have sent in a referral to wound care center  *Lower extremity swelling - Follow-up with wound care and your primary care doctor regarding compression stockings.   *For your Blood Pressure -we are going to hold your blood pressure medications until you see your PCP since your Blood pressure has been normal off of the medications.   -Please see primary care doctor in 7 to 10 days - Follow-up with wound care. - Follow-up with your pulmonologist.  We are glad you are feeling better,  Sallyanne Primas Internal Medicine Inpatient Teaching Service at [facility]

## 2023-09-30 NOTE — Plan of Care (Signed)

## 2023-09-30 NOTE — Evaluation (Signed)
 Physical Therapy Evaluation Patient Details Name: April [name] MRN: [id number] DOB: 26-Dec-1955 Today's Date: 09/30/2023  History of Present Illness  Pt is a 68 y.o female presenting to [facility] on 9/24 with weakness and shortness of breath. Pt arrived from [facility]. Pt found to by hypotensive that improved with fluids. PMH: CHF, COPD, HTN, asthma, hyperlipidemia, and L RTSA (2022)  Clinical Impression  Pt is presenting at [facility] to supervision level for all functional mobility. Currently pt was able to perform sit to stand and gait with RW at [facility] for safety; most likely could have performed at supervision level. Pt with narrow BOS during gait with R bias; states this is her baseline. Due to pt current functional status, home set up and available assistance at home recommending skilled physical therapy services 3x/week in order to address strength, balance and functional mobility to decrease risk for falls, injury and re-hospitalization.           If plan is discharge home, recommend the following: Assist for transportation;Assistance with cooking/housework     Equipment Recommendations None recommended by PT     Functional Status Assessment Patient has had a recent decline in their functional status and demonstrates the ability to make significant improvements in function in a reasonable and predictable amount of time.     Precautions / Restrictions Precautions Precautions: Fall Recall of Precautions/Restrictions: Impaired Restrictions Weight Bearing Restrictions Per Provider Order: No      Mobility  Bed Mobility   General bed mobility comments: in recliner on arrival and departure; per OT note pt is ind    Transfers Overall transfer level: Needs assistance Equipment used: Rolling walker (2 wheels) Transfers: Sit to/from Stand, Bed to chair/wheelchair/BSC Sit to Stand: Contact guard assist   Step pivot transfers: Contact guard assist       General transfer comment: CGA for  transfer today with RW for safety. Pt most likely could have performed at a supervision level.    Ambulation/Gait Ambulation/Gait assistance: Contact guard assist Gait Distance (Feet): 200 Feet Assistive device: Rolling walker (2 wheels) Gait Pattern/deviations: Step-through pattern, Decreased stride length, Narrow base of support Gait velocity: decreased Gait velocity interpretation: <1.31 ft/sec, indicative of household ambulator   General Gait Details: Pt with narrow BOS and slight R bias; verbal cues for wider BOS with mild improvement briefly after each cue. Cues throughout for pt to stay closer to RW in order to maintain appropriate body proximity to AD for support. CGA for safety; pt most likely could have performed at a supervision level. O2 sats remained 92-93% on room air during gait.     Balance Overall balance assessment: Needs assistance Sitting-balance support: Feet supported, No upper extremity supported Sitting balance-Leahy Scale: Good     Standing balance support: Bilateral upper extremity supported, Reliant on assistive device for balance Standing balance-Leahy Scale: Fair Standing balance comment: pt with narrow BOS requires UE support         Pertinent Vitals/Pain Pain Assessment Pain Assessment: No/denies pain Pain Intervention(s): Monitored during session    Home Living Family/patient expects to be discharged to:: Other (Comment) (independent living.)     Home Equipment: Wheelchair - manual;Shower seat - built in;Grab bars - tub/shower;Grab bars - toilet;Hand held Stage manager (4 wheels);BSC/3in1;Cane - quad Additional Comments: Supplements independent living with aide. Pt reported assistance will stop in everyday to do a check-in but will come in 3 days a week to complete household tasks like cleaning and food rpeparation.    Prior  Function Prior Level of Function : Needs assist       Physical Assist : Mobility (physical);ADLs  (physical) Mobility (physical): Transfers;Gait ADLs (physical): IADLs;Dressing;Bathing;Toileting Mobility Comments: uses wheelchair primarly and receives assistance to transfer in and out of wheelchair ADLs Comments: Has been in wheelchair since August, receives PT and OT through [facility] at ALF. Care assists with LB ADLs, cooking, cleaning.     Extremity/Trunk Assessment   Upper Extremity Assessment Upper Extremity Assessment: Defer to OT evaluation    Lower Extremity Assessment Lower Extremity Assessment: Generalized weakness    Cervical / Trunk Assessment Cervical / Trunk Assessment: Normal  Communication   Communication Communication: No apparent difficulties    Cognition Arousal: Alert Behavior During Therapy: WFL for tasks assessed/performed   PT - Cognitive impairments: No apparent impairments     Following commands: Intact       Cueing Cueing Techniques: Verbal cues     General Comments General comments (skin integrity, edema, etc.): O2 sats remained in the 90's on room air with gait.        Assessment/Plan    PT Assessment Patient needs continued PT services  PT Problem List Decreased strength;Decreased activity tolerance;Decreased balance;Decreased mobility;Decreased safety awareness       PT Treatment Interventions DME instruction;Gait training;Functional mobility training;Therapeutic activities;Therapeutic exercise;Balance training;Neuromuscular re-education;Patient/family education    PT Goals (Current goals can be found in the Care Plan section)  Acute Rehab PT Goals Patient Stated Goal: to go back home and know what is going on with her oxygen . PT Goal Formulation: With patient Time For Goal Achievement: 10/14/23 Potential to Achieve Goals: Good    Frequency Min 1X/week        AM-PAC PT 6 Clicks Mobility  Outcome Measure Help needed turning from your back to your side while in a flat bed without using bedrails?: None Help needed moving  from lying on your back to sitting on the side of a flat bed without using bedrails?: None Help needed moving to and from a bed to a chair (including a wheelchair)?: A Little Help needed standing up from a chair using your arms (e.g., wheelchair or bedside chair)?: A Little Help needed to walk in hospital room?: A Little Help needed climbing 3-5 steps with a railing? : A Lot 6 Click Score: 19    End of Session Equipment Utilized During Treatment: Gait belt Activity Tolerance: Patient tolerated treatment well Patient left: in chair;with call bell/phone within reach;with chair alarm set Nurse Communication: Mobility status PT Visit Diagnosis: Unsteadiness on feet (R26.81);Other abnormalities of gait and mobility (R26.89);Muscle weakness (generalized) (M62.81)    Time: 8883-8870 PT Time Calculation (min) (ACUTE ONLY): 13 min   Charges:   PT Evaluation $PT Eval Low Complexity: 1 Low   PT General Charges $$ ACUTE PT VISIT: 1 Visit        Dorothyann Maier, DPT, CLT  Acute Rehabilitation Services Office: [phone] (Secure chat preferred)   Dorothyann VEAR Maier 09/30/2023, 1:29 PM

## 2023-09-30 NOTE — NC FL2 (Signed)
 [place]  MEDICAID FL2 LEVEL OF CARE FORM     IDENTIFICATION  Patient Name: April [name] Birthdate: 01/02/1956 Sex: female Admission Date (Current Location): 09/29/2023  [facility] and [place] Number:  Producer, television/film/video and Address:  [facility], [address] [place], [place] [address]      Provider Number: [id number]  Attending Physician Name and Address:  Francesco Elsie NOVAK, MD  Relative Name and Phone Number:  Montgomery Beal; Son; [phone]    Current Level of Care: Hospital Recommended Level of Care: Assisted Living Facility, Other (Comment) (DME) Prior Approval Number:    Date Approved/Denied:   PASRR Number: [id number]  Discharge Plan: Home (ALF)    Current Diagnoses: Patient Active Problem List   Diagnosis Date Noted   COPD with acute exacerbation (HCC) 09/29/2023   Pelvic fracture (HCC) 08/17/2023   Acute exacerbation of COPD with asthma (HCC) 07/24/2023   Acute exacerbation of chronic obstructive pulmonary disease (COPD) (HCC) 07/23/2023   Atrophy of skin due to systemic corticosteroid 04/08/2023   Parkinson's disease with dyskinesia and fluctuating manifestations (HCC) 04/08/2023   Bilateral lower extremity edema 05/27/2022   Frequent falls 03/05/2022   Serrated polyp of colon 03/05/2022   Mixed hyperlipidemia 12/03/2021   Unsteady gait 07/20/2021   Chronic diastolic CHF (congestive heart failure) (HCC) 06/13/2021   Upper airway cough syndrome 05/07/2021   Osteoporosis 03/23/2021   Allergic rhinitis 04/06/2019   Aortic atherosclerosis 12/17/2017   Pulmonary nodule 12/17/2017   GERD (gastroesophageal reflux disease) 12/12/2017   COPD, severe (HCC) 11/24/2017   CKD (chronic kidney disease), stage III (HCC) 10/28/2017   Vertigo 10/27/2017   Essential hypertension 06/23/2017   Polymyalgia rheumatica 05/22/2016   History of bilateral hip replacements 05/22/2016   DDD (degenerative disc disease), lumbar 05/22/2016    History of avascular necrosis of capital femoral epiphysis 08/14/2014   Asthma 03/23/2010   Acquired hypothyroidism 03/21/2010   Chronic recurrent major depressive disorder 03/21/2010   Former smoker 03/21/2010   History of temporal arteritis 03/21/2010    Orientation RESPIRATION BLADDER Height & Weight     Self, Time, Situation, Place  Normal (Room Air) Continent Weight: 160 lb 7.9 oz (72.8 kg) Height:  5' 7.2 (170.7 cm)  BEHAVIORAL SYMPTOMS/MOOD NEUROLOGICAL BOWEL NUTRITION STATUS      Continent Diet (Please see discharge summary)  AMBULATORY STATUS COMMUNICATION OF NEEDS Skin     Verbally Other (Comment) (Wound 09/29/23 2000 Traumatic Pretibial Distal;Right x2)                       Personal Care Assistance Level of Assistance              Functional Limitations Info  Sight Sight Info: Impaired (R and L (Eyeglasses))        SPECIAL CARE FACTORS FREQUENCY                       Contractures Contractures Info: Not present    Additional Factors Info  Code Status, Allergies Code Status Info: Full Code Allergies Info: Sulfa Antibiotics; Sulfonamide Derivatives; Alendronate Sodium; Dilaudid  (hyromorphone Hcl); Molds and Smuts; Pollen Extract; Wound Dressing Adhesive           Current Medications (09/30/2023):  This is the current hospital active medication list Current Facility-Administered Medications  Medication Dose Route Frequency Provider Last Rate Last Admin   acetaminophen  (TYLENOL ) tablet 650 mg  650 mg Oral Q6H PRN Tawkaliyar, Roya, DO  650 mg at 09/30/23 9148   Or   acetaminophen  (TYLENOL ) suppository 650 mg  650 mg Rectal Q6H PRN Tawkaliyar, Roya, DO       artificial tears ophthalmic solution 1 drop  1 drop Both Eyes BID Tawkaliyar, Roya, DO   1 drop at 09/30/23 0854   azithromycin  (ZITHROMAX ) tablet 250 mg  250 mg Oral Q M,W,F Tawkaliyar, Roya, DO   250 mg at 09/29/23 2002   budesonide -glycopyrrolate -formoterol  (BREZTRI ) 160-9-4.8 MCG/ACT  inhaler 2 puff  2 puff Inhalation BID Tawkaliyar, Roya, DO   2 puff at 09/30/23 0857   calcium  carbonate (OS-CAL - dosed in mg of elemental calcium ) tablet 1,250 mg  1,250 mg Oral BID Tawkaliyar, Roya, DO   1,250 mg at 09/30/23 0851   carbidopa -levodopa  (SINEMET  IR) 25-100 MG per tablet immediate release 1 tablet  1 tablet Oral 3 times per day Tawkaliyar, Roya, DO   1 tablet at 09/30/23 0851   cholecalciferol  (VITAMIN D3) 25 MCG (1000 UNIT) tablet 5,000 Units  5,000 Units Oral Daily Tawkaliyar, Roya, DO   5,000 Units at 09/30/23 0850   enoxaparin  (LOVENOX ) injection 30 mg  30 mg Subcutaneous Q24H Tawkaliyar, Roya, DO   30 mg at 09/29/23 1743   feeding supplement (ENSURE PLUS HIGH PROTEIN) liquid 237 mL  237 mL Oral BID BM Tawkaliyar, Roya, DO       ipratropium-albuterol  (DUONEB) 0.5-2.5 (3) MG/3ML nebulizer solution 3 mL  3 mL Nebulization Q4H PRN Tawkaliyar, Roya, DO       levothyroxine  (SYNTHROID ) tablet 88 mcg  88 mcg Oral QAC breakfast Tawkaliyar, Roya, DO   88 mcg at 09/30/23 0616   pantoprazole  (PROTONIX ) EC tablet 40 mg  40 mg Oral Daily Tawkaliyar, Roya, DO   40 mg at 09/30/23 0851   polyethylene glycol (MIRALAX  / GLYCOLAX ) packet 17 g  17 g Oral Daily PRN Tawkaliyar, Roya, DO       predniSONE  (DELTASONE ) tablet 40 mg  40 mg Oral Q breakfast Tawkaliyar, Roya, DO   40 mg at 09/30/23 0851   rosuvastatin  (CRESTOR ) tablet 10 mg  10 mg Oral QHS Tawkaliyar, Roya, DO   10 mg at 09/29/23 2155   sertraline  (ZOLOFT ) tablet 50 mg  50 mg Oral QHS Tawkaliyar, Roya, DO   50 mg at 09/29/23 2155     Discharge Medications: Please see discharge summary for a list of discharge medications.  Relevant Imaging Results:  Relevant Lab Results:   Additional Information SS# [id number]  Lauraine FORBES Saa, LCSWA

## 2023-09-30 NOTE — TOC Initial Note (Addendum)
 Transition of Care (TOC) - Initial/Assessment Note   Spoke to patient at bedside. Patient from [facility].   She has home oxygen  with [employer]. Her portable oxygen  is at home. Her son will be providing transportation home, however her portable oxygen  is not charged.   [place] called Ar[facility] with [employer] they will bring portable oxygen  to hospital room prior to discharge , along with bedside commode and NEB machine   Asked MD for HHPT/OT orders and face to face to fax to [facility] at [place]. [facility] already has patient on list but needs orders . Faxed orders to [facility] with [facility] , and called   Patient Details  Name: April [name] MRN: [id number] Date of Birth: 03-Jan-1956  Transition of Care [facility]) CM/SW Contact:    Stephane Powell Jansky, RN Phone Number: 09/30/2023, 11:12 AM  Clinical Narrative:                   Expected Discharge Plan: Home/Self Care Barriers to Discharge: No Barriers Identified   Patient Goals and CMS Choice Patient states their goals for this hospitalization and ongoing recovery are:: to return to home CMS Medicare.gov Compare Post Acute Care list provided to:: Patient Choice offered to / list presented to : Patient      Expected Discharge Plan and Services   Discharge Planning Services: CM Consult Post Acute Care Choice: Durable Medical Equipment Living arrangements for the past 2 months: Apartment                 DME Arranged: 3-N-1, Oxygen , Nebulizer/meds DME Agency: [employer] Date DME Agency Contacted: 09/30/23 Time DME Agency Contacted: [phone] Representative spoke with at DME Agency: Arthea HH Arranged: NA          Prior Living Arrangements/Services Living arrangements for the past 2 months: Apartment Lives with:: Self Patient language and need for interpreter reviewed:: Yes Do you feel safe going back to the place where you live?: Yes      Need for Family Participation in Patient Care: Yes (Comment) Care giver  support system in place?: Yes (comment) Current home services: DME Criminal Activity/Legal Involvement Pertinent to Current Situation/Hospitalization: No - Comment as needed  Activities of Daily Living   ADL Screening (condition at time of admission) Independently performs ADLs?: No Does the patient have a NEW difficulty with bathing/dressing/toileting/self-feeding that is expected to last >3 days?: Yes (Initiates electronic notice to provider for possible OT consult) Does the patient have a NEW difficulty with getting in/out of bed, walking, or climbing stairs that is expected to last >3 days?: Yes (Initiates electronic notice to provider for possible PT consult) Does the patient have a NEW difficulty with communication that is expected to last >3 days?: No Is the patient deaf or have difficulty hearing?: No Does the patient have difficulty seeing, even when wearing glasses/contacts?: No Does the patient have difficulty concentrating, remembering, or making decisions?: No  Permission Sought/Granted   Permission granted to share information with : Yes, Verbal Permission Granted     Permission granted to share info w AGENCY: [employer]        Emotional Assessment Appearance:: Appears stated age Attitude/Demeanor/Rapport: Engaged Affect (typically observed): Appropriate Orientation: : Oriented to Self, Oriented to Place, Oriented to  Time, Oriented to Situation Alcohol  / Substance Use: Not Applicable Psych Involvement: No (comment)  Admission diagnosis:  COPD exacerbation (HCC) [J44.1] AKI (acute kidney injury) [N17.9] COPD with acute exacerbation (HCC) [J44.1] Patient Active Problem List  Diagnosis Date Noted   COPD with acute exacerbation (HCC) 09/29/2023   Pelvic fracture (HCC) 08/17/2023   Acute exacerbation of COPD with asthma (HCC) 07/24/2023   Acute exacerbation of chronic obstructive pulmonary disease (COPD) (HCC) 07/23/2023   Atrophy of skin due to systemic corticosteroid  04/08/2023   Parkinson's disease with dyskinesia and fluctuating manifestations (HCC) 04/08/2023   Bilateral lower extremity edema 05/27/2022   Frequent falls 03/05/2022   Serrated polyp of colon 03/05/2022   Mixed hyperlipidemia 12/03/2021   Unsteady gait 07/20/2021   Chronic diastolic CHF (congestive heart failure) (HCC) 06/13/2021   Upper airway cough syndrome 05/07/2021   Osteoporosis 03/23/2021   Allergic rhinitis 04/06/2019   Aortic atherosclerosis 12/17/2017   Pulmonary nodule 12/17/2017   GERD (gastroesophageal reflux disease) 12/12/2017   COPD, severe (HCC) 11/24/2017   CKD (chronic kidney disease), stage III (HCC) 10/28/2017   Vertigo 10/27/2017   Essential hypertension 06/23/2017   Polymyalgia rheumatica 05/22/2016   History of bilateral hip replacements 05/22/2016   DDD (degenerative disc disease), lumbar 05/22/2016   History of avascular necrosis of capital femoral epiphysis 08/14/2014   Asthma 03/23/2010   Acquired hypothyroidism 03/21/2010   Chronic recurrent major depressive disorder 03/21/2010   Former smoker 03/21/2010   History of temporal arteritis 03/21/2010   PCP:  Tonnie Jon SAILOR, NP Pharmacy:   [facility] [id number] - [place] - [address] [place] [address] Phone: [phone] Fax: [phone]  [place] [facility] [address] [place] [address] Phone: [phone] Fax: [phone]     Social Drivers of Health (SDOH) Social History: SDOH Screenings   Food Insecurity: No Food Insecurity (09/30/2023)  Housing: High Risk (09/30/2023)  Transportation Needs: No Transportation Needs (09/30/2023)  Utilities: Not At Risk (09/30/2023)  Alcohol  Screen: Low Risk  (11/26/2021)  Depression (PHQ2-9): Low Risk  (04/08/2023)  Financial Resource Strain: Medium Risk (12/01/2022)  Physical Activity: Insufficiently Active (12/01/2022)  Social Connections: Socially Isolated  (09/30/2023)  Stress: No Stress Concern Present (12/01/2022)  Tobacco Use: Medium Risk (09/29/2023)  Health Literacy: Adequate Health Literacy (12/01/2022)   SDOH Interventions:     Readmission Risk Interventions     No data to display

## 2023-09-30 NOTE — Discharge Summary (Signed)
 Name: April [name] MRN: [id number] DOB: 1955-10-01 68 y.o. PCP: Tonnie Jon SAILOR, NP  Date of Admission: 09/29/2023 12:35 PM Date of Discharge: 09/30/2023 Attending Physician: Dr. MICAEL Riis Winfrey  Discharge Diagnosis: 1. Principal Problem:   COPD with acute exacerbation (HCC) Active Problems:   Essential hypertension   CKD (chronic kidney disease), stage III (HCC)   COPD, severe (HCC)   Chronic diastolic CHF (congestive heart failure) (HCC)   AKI (acute kidney injury)   Non-healing wound of lower extremity   Discharge Medications: Allergies as of 09/30/2023       Reactions   Sulfa Antibiotics Anaphylaxis, Hives, Dermatitis   Sulfonamide Derivatives Anaphylaxis, Swelling, Dermatitis, Rash, Other (See Comments)   Throat closes   Dilaudid  [hydromorphone  Hcl] Nausea And Vomiting   Fosamax [alendronate Sodium] Other (See Comments)   GI intolerence   Molds & Smuts Itching, Other (See Comments)   Sinus and eye irritation   Pollen Extract Itching, Other (See Comments)   Sinus and eye irritation   Wound Dressing Adhesive Other (See Comments)   Redness Skin tears easily OK to use paper tape        Medication List     PAUSE taking these medications    metoprolol  succinate 25 MG 24 hr tablet Wait to take this until your doctor or other care provider tells you to start again. Commonly known as: TOPROL -XL Take 25 mg by mouth at bedtime.   valsartan  40 MG tablet Wait to take this until your doctor or other care provider tells you to start again. Commonly known as: DIOVAN  Take 40 mg by mouth at bedtime.       TAKE these medications    acetaminophen  500 MG tablet Commonly known as: TYLENOL  Take 1,000 mg by mouth 2 (two) times daily as needed for headache or fever (pain).   albuterol  108 (90 Base) MCG/ACT inhaler Commonly known as: VENTOLIN  HFA Inhale 2 puffs into the lungs every 6 (six) hours as needed for wheezing or shortness of breath.   azithromycin   250 MG tablet Commonly known as: ZITHROMAX  Take 1 tablet (250 mg total) by mouth every Monday, Wednesday, and Friday.   Breztri  Aerosphere 160-9-4.8 MCG/ACT Aero inhaler Generic drug: budesonide -glycopyrrolate -formoterol  Inhale 2 puffs into the lungs every 12 (twelve) hours.   buPROPion  300 MG 24 hr tablet Commonly known as: WELLBUTRIN  XL Take 300 mg by mouth at bedtime.   CALCIUM  + VITAMIN D3 PO Take 1 each by mouth 2 (two) times daily. OTC calcium  + vitamin d3 gummy   Calcium  500 MG Chew Chew 1 tablet (500 mg total) by mouth 2 (two) times daily.   carbidopa -levodopa  25-100 MG tablet Commonly known as: SINEMET  IR TAKE 1 TABLET BY MOUTH 3 TIMES A DAY What changed:  when to take this additional instructions   carboxymethylcellulose 0.5 % Soln Commonly known as: REFRESH PLUS Place 1 drop into both eyes 2 (two) times daily.   feeding supplement Liqd Take 237 mLs by mouth 2 (two) times daily between meals. What changed: when to take this   furosemide  20 MG tablet Commonly known as: LASIX  Take 20-40 mg by mouth See admin instructions. Take 1 tablet (20mg ) by mouth once daily as needed for excessive swelling. Take 2 tablets (40mg ) once daily for 3 days starting 09/28/23 then resume as needed.   levothyroxine  88 MCG tablet Commonly known as: SYNTHROID  Take 88 mcg by mouth daily before breakfast.   methocarbamol  750 MG tablet Commonly known as: ROBAXIN  Take 750  mg by mouth 2 (two) times daily as needed for muscle spasms.   Nucala  100 MG/ML Soaj Generic drug: Mepolizumab  Inject contents of one pen (100mg ) in the skin on day 0 in clinic, and every 28 days thereafter. Courier to pulm: [address], [address], [place] [address]. Appt on 09/23/23.   omeprazole  20 MG capsule Commonly known as: PRILOSEC TAKE 1 CAPSULE BY MOUTH EVERY DAY What changed:  how much to take when to take this   oxyCODONE  5 MG immediate release tablet Commonly known as: Oxy IR/ROXICODONE  Take 5  mg by mouth every 4 (four) hours as needed for severe pain (pain score 7-10).   polyethylene glycol 17 g packet Commonly known as: MIRALAX  / GLYCOLAX  Take 17 g by mouth daily as needed for moderate constipation.   potassium chloride  10 MEQ tablet Commonly known as: KLOR-CON  M Take 1 tablet (10 mEq total) by mouth daily as needed (when you take the lasix ). What changed:  when to take this additional instructions   predniSONE  20 MG tablet Commonly known as: DELTASONE  Take 2 tablets (40 mg total) by mouth daily with breakfast for 4 days.   rosuvastatin  10 MG tablet Commonly known as: CRESTOR  Take 1 tablet (10 mg total) by mouth at bedtime.   sertraline  50 MG tablet Commonly known as: ZOLOFT  Take 50 mg by mouth at bedtime.   Systane Ultra 0.4-0.3 % Soln Generic drug: Polyethyl Glycol-Propyl Glycol Place 1 drop into both eyes 2 (two) times daily.   tiZANidine  4 MG tablet Commonly known as: ZANAFLEX  Take 4 mg by mouth daily as needed (severe muscle spasms).   VITAMIN B 12 PO Take 1 tablet by mouth at bedtime.   VITAMIN D -3 PO Take 1 capsule by mouth daily.               Durable Medical Equipment  (From admission, onward)           Start     Ordered   09/30/23 1054  For home use only DME 3 n 1  Once        09/30/23 1054   09/30/23 1033  For home use only DME Nebulizer machine  Once       Question Answer Comment  Patient needs a nebulizer to treat with the following condition COPD (chronic obstructive pulmonary disease) (HCC)   Patient needs a nebulizer to treat with the following condition Asthma   Length of Need 12 Months   Additional equipment included Administration kit   Additional equipment included Filter      09/30/23 1037              Discharge Care Instructions  (From admission, onward)           Start     Ordered   09/30/23 0000  Discharge wound care:       Comments: F/u with wound care outpatient   09/30/23 1433             Disposition and follow-up:   April [name] was discharged from [facility] in Good condition.  At the hospital follow up visit please address:  1.  COPD: Feeling well after breathing treatments.  Discharge home with albuterol  nebulizer, previously on 2 L of home oxygen , patient states she would pick up a new pulse ox.  AKI: Likely in setting of dehydration.  Resolving with rehydration, follow-up with labs  LE Wounds: Wound care referral placed.  Patient stated she  received these by falling out of the bed.    LE edema: F/u about compression stockings Patient stated that Lasix  was not previously working to help with lower extremity edema.  Assess need for diuresis versus utilize patient of compression stockings.  HTN - recheck BP and restart meds as indicated. BP off medications during hospitalization 115/65. Low Bp at home could be contributing to falls.   2.  Labs / imaging needed at time of follow-up: BMP, BP  3.  Pending labs/ test needing follow-up: N/A  Follow-up Appointments:  Instructed to follow-up with pulmonologist, PCP, new wound care referral placed.  Hospital Course by problem list: April [name] is a 68 y.o. person living with a history of COPD, chronic hypoxic respiratory failure, CHF, CKD, HTN, HLD, PSP who presented with SOB and admitted for COPD exacerbation and AKI now being discharged on hospital day 0 with the following pertinent hospital course:  Acute Hypoxic Respiratory Failure Likely Due to COPD Exacerbation Presented with SOB after being unable to catch her breath on the day of admission. She had a COPD exacerbation 1 month prior to admission after which she was started on azithromycin  MWF and 2L home oxygen . COPD home medications also include Breztri  inhaler BID and albuterol  that she uses once per day. RPP negative. CXR showed hyperinflation with flattened diaphragms, consistent with COPD. Difficulty breathing imprved during the  course of her stay after treatment with Duonebs and corticosteroids. On room air she retained SpO2 > 92%. Gold Group-E classification due to recurrent exacerbations. She will be discharged on home COPD management medications Breztri  and albuterol , along with 5 total days of prednisone  40 mg. She will also receive an albuterol  nebulizer. On day of discharge she feels well and is ready to go home.  AKI CKD Pt has chronic LE swelling that had acute exacerbation in the past few days. She was advised by PCP to take her Lasix  BID. Labs collected in the ED were concerning for AKI. She had decreased PO intake while increasing her home Lasix  dose and continuing on metoprolol  and valsartan , concerning for dehydration. She was given fluid resuscitation and saw improvement in her creatinine. She was advised to have close PCP follow up and continue oral rehydration upon discharge.   Right Leg Wounds Patient stated that she had fallen out of her bed a few weeks ago resulting in significant b/l bruising and 2 lacerations on her right leg. Lacerations currently scabbed over with small amount of exudate. Outpatient wound care order placed.  Lower Extremity Edema HFimpEF Patient presented with bilateral lower extremity edema that she states has become worse in the past week. She was advised to increase PRN furosemide  to 20 mg BID. She did not experience any relief in her edema. She has a history of HF with previous echo demonstrating 50-55%. BNP, Trops were within normal limits. During her stay home heart medications metoprolol , valsartan , and her Lasix  were held due to AKI. Edema did not change during the course of her stay. She will be able to resume heart medications with discharge, with close monitoring of renal function by PCP. Compression stockings were ordered and recommended for help controlling edema at home.   Stable chronic medical conditions: HTN Valsartan  and metoprolol  held due to AKI. Blood pressure  stable around 120s/60s off BP meds during hospital course. Holding BP meds until seen by PCP concern for low bp contributing to falls outpatient.  Hypothyroidism  She was given her levothyroxine  88 mcg daily.  Major depressive disorder   She was given her sertraline  50 mg    PSP (progressive supranuclear palsy) She was given her carbidopa -levodopa  25-100 mg   Hyperlipidemia  She was given her Crestor  10 mg   Osteoporosis She was given her calcium  and Vit D daily  GERD She was given her pantoprazole  40 mg daily   Subjective Breathing on room air, no CP, no SOB, no abd pain, she is drinking and eating well, had 2 nl BM.  Overall feeling very well.  Stated when she was ambulating her oxygen  dropped to 88%. Has previously tried cotton compression socks but did not like them.  Reports wounds on legs resulted after falling out of bed.    Discharge Exam:   BP 114/66 (BP Location: Right Arm)   Pulse 93   Temp 98.6 F (37 C) (Oral)   Resp 17   Ht 5' 7.2 (1.707 m)   Wt 72.8 kg   SpO2 96%   BMI 24.99 kg/m  Discharge exam:  Physical Exam Cardiovascular:     Rate and Rhythm: Normal rate and regular rhythm.     Heart sounds: No murmur heard. Pulmonary:     Effort: Pulmonary effort is normal. No tachypnea, bradypnea, accessory muscle usage or respiratory distress.     Breath sounds: Normal breath sounds. No stridor. No decreased breath sounds, wheezing, rhonchi or rales.  Abdominal:     General: Bowel sounds are normal.     Palpations: Abdomen is soft.     Tenderness: There is no abdominal tenderness.  Musculoskeletal:     Right lower leg: No tenderness. Edema present.     Left lower leg: No tenderness. Edema present.  Skin:    General: Skin is warm and dry.     Comments: Wounds located on right lower extremity.  See media picture.  No erythema, warmth  Neurological:     Mental Status: She is alert.       Pertinent Labs, Studies, and Procedures:     Latest Ref Rng &  Units 09/30/2023    2:06 AM 09/29/2023    1:25 PM 09/29/2023    1:04 PM  CBC  WBC 4.0 - 10.5 K/uL 4.0   6.6   Hemoglobin 12.0 - 15.0 g/dL 9.9  89.4  89.2   Hematocrit 36.0 - 46.0 % 31.3  31.0  34.1   Platelets 150 - 400 K/uL 175   201        Latest Ref Rng & Units 09/30/2023    2:06 AM 09/29/2023    1:25 PM 09/29/2023    1:04 PM  CMP  Glucose 70 - 99 mg/dL 870   891   BUN 8 - 23 mg/dL 27   28   Creatinine 9.55 - 1.00 mg/dL 8.41   7.98   Sodium 864 - 145 mmol/L 137  138  137   Potassium 3.5 - 5.1 mmol/L 4.1  4.2  4.0   Chloride 98 - 111 mmol/L 106   101   CO2 22 - 32 mmol/L 23   26   Calcium  8.9 - 10.3 mg/dL 8.6   9.1   Total Protein 6.5 - 8.1 g/dL   5.9   Total Bilirubin 0.0 - 1.2 mg/dL   0.9   Alkaline Phos 38 - 126 U/L   131   AST 15 - 41 U/L   21   ALT 0 - 44 U/L   16     DG Chest Portable 1 View  Result Date: 09/29/2023 EXAM: 1 VIEW(S) XRAY OF THE CHEST 09/29/2023 01:30:00 PM COMPARISON: 07/22/2023 CLINICAL HISTORY: weakness, SOB FINDINGS: LUNGS AND PLEURA: Lung hyperinflation. No focal pulmonary opacity. No pulmonary edema. No pleural effusion. No pneumothorax. HEART AND MEDIASTINUM: Atherosclerotic plaque. No acute abnormality of the cardiac and mediastinal silhouettes. BONES AND SOFT TISSUES: Left shoulder arthroplasty. No acute osseous abnormality. IMPRESSION: 1. No acute findings. 2. Lung hyperinflation with interstitial changes concerning for emphysema. . 3. Atherosclerotic plaque. Electronically signed by: Waddell Calk MD 09/29/2023 02:01 PM EDT RP Workstation: [id number]     Discharge Instructions: Discharge Instructions     AMB referral to wound care center   Complete by: As directed    RLE wounds (see media for photo)   Call MD for:  difficulty breathing, headache or visual disturbances   Complete by: As directed    Call MD for:  extreme fatigue   Complete by: As directed    Call MD for:  hives   Complete by: As directed    Call MD for:  persistant dizziness or  light-headedness   Complete by: As directed    Call MD for:  persistant nausea and vomiting   Complete by: As directed    Call MD for:  severe uncontrolled pain   Complete by: As directed    Call MD for:  temperature >100.4   Complete by: As directed    Diet - low sodium heart healthy   Complete by: As directed    Discharge instructions   Complete by: As directed    Thank you for allowing us  to be part of your care. You were hospitalized for COPD exacerbation and acute kidney injury from dehydration we treated you with breathing treatments and hydration.  See the changes in your medications and management of your chronic conditions below:  *For your COPD  -We have STARTED you on these following medications:            -Prednisone  40 mg daily until 9/29            - Albuterol  nebulizer -CONTINUE             - Breztri              - Albuterol  as needed             - Azithromycin  250 mg Monday, Wednesday, Friday             - Follow-up with your pulmonologist - If your oxygen  drops to 88% while walking, that is okay.  Anything less than that, you may utilize her home oxygen  and talk with your PCP or pulmonologist.  *For your acute kidney injury -Ensure adequate hydration at home  *For your wounds - We have sent in a referral to wound care center  *Lower extremity swelling - Follow-up with wound care and your primary care doctor regarding compression stockings.   *For your Blood Pressure -we are going to hold your blood pressure medications until you see your PCP since your Blood pressure has been normal off of the medications.   -Please see primary care doctor in 7 to 10 days - Follow-up with wound care. - Follow-up with your pulmonologist.  We are glad you are feeling better,  Sallyanne Primas Internal Medicine Inpatient Teaching Service at [facility]   Discharge wound care:   Complete by: As directed    F/u with wound care outpatient   Face-to-face encounter (required for  Medicare/Medicaid patients)   Complete  by: As directed    I Sallyanne Primas certify that this patient is under my care and that I, or a nurse practitioner or physician's assistant working with me, had a face-to-face encounter that meets the physician face-to-face encounter requirements with this patient on 09/30/2023. The encounter with the patient was in whole, or in part for the following medical condition(s) which is the primary reason for home health care (List medical condition): SOB with activity, recent falls   The encounter with the patient was in whole, or in part, for the following medical condition, which is the primary reason for home health care: COPD exacerbation, hypotensive episode   I certify that, based on my findings, the following services are medically necessary home health services: Physical therapy   Reason for Medically Necessary Home Health Services:  Therapy- Therapeutic Exercises to Increase Strength and Endurance Therapy- Home Adaptation to Facilitate Safety     My clinical findings support the need for the above services: Unsafe ambulation due to balance issues   Further, I certify that my clinical findings support that this patient is homebound due to: Shortness of Breath with activity   Home Health   Complete by: As directed    Face to face   To provide the following care/treatments:  PT OT     Increase activity slowly   Complete by: As directed    No wound care   Complete by: As directed    Wound care consult placed for outpatient       Signed: Primas Sallyanne, DO 09/30/2023, 2:46 PM

## 2023-09-30 NOTE — Progress Notes (Signed)
 Reviewed AVS, patient expressed understanding of medications, MD follow up reviewed.  .  See LDA for information on wounds at discharge. Patient states all belongings brought to the hospital at time of admission are accounted for and packed to take home.  Picked up medications from [facility] pharmacy. [facility] [employer] contacted to transport patient to Discharge lounge to wait for transportation home.

## 2023-09-30 NOTE — Progress Notes (Signed)
 April [name] has dyspnea with exertion and thus bedside commode is ordered to reduce distance for trips to restroom.  [name] MD 09/30/2023, 10:56 AM

## 2023-10-01 DIAGNOSIS — M810 Age-related osteoporosis without current pathological fracture: Secondary | ICD-10-CM | POA: Diagnosis not present

## 2023-10-01 DIAGNOSIS — M62521 Muscle wasting and atrophy, not elsewhere classified, right upper arm: Secondary | ICD-10-CM | POA: Diagnosis not present

## 2023-10-01 DIAGNOSIS — M62522 Muscle wasting and atrophy, not elsewhere classified, left upper arm: Secondary | ICD-10-CM | POA: Diagnosis not present

## 2023-10-01 DIAGNOSIS — R2689 Other abnormalities of gait and mobility: Secondary | ICD-10-CM | POA: Diagnosis not present

## 2023-10-01 DIAGNOSIS — J449 Chronic obstructive pulmonary disease, unspecified: Secondary | ICD-10-CM | POA: Diagnosis not present

## 2023-10-01 DIAGNOSIS — R2681 Unsteadiness on feet: Secondary | ICD-10-CM | POA: Diagnosis not present

## 2023-10-01 DIAGNOSIS — R296 Repeated falls: Secondary | ICD-10-CM | POA: Diagnosis not present

## 2023-10-01 DIAGNOSIS — R278 Other lack of coordination: Secondary | ICD-10-CM | POA: Diagnosis not present

## 2023-10-01 DIAGNOSIS — M6250 Muscle wasting and atrophy, not elsewhere classified, unspecified site: Secondary | ICD-10-CM | POA: Diagnosis not present

## 2023-10-01 DIAGNOSIS — J441 Chronic obstructive pulmonary disease with (acute) exacerbation: Principal | ICD-10-CM

## 2023-10-04 DIAGNOSIS — J441 Chronic obstructive pulmonary disease with (acute) exacerbation: Secondary | ICD-10-CM | POA: Diagnosis not present

## 2023-10-04 DIAGNOSIS — I700 Atherosclerosis of aorta: Secondary | ICD-10-CM | POA: Diagnosis not present

## 2023-10-04 DIAGNOSIS — I110 Hypertensive heart disease with heart failure: Secondary | ICD-10-CM | POA: Diagnosis not present

## 2023-10-04 DIAGNOSIS — M62521 Muscle wasting and atrophy, not elsewhere classified, right upper arm: Secondary | ICD-10-CM | POA: Diagnosis not present

## 2023-10-04 DIAGNOSIS — R2681 Unsteadiness on feet: Secondary | ICD-10-CM | POA: Diagnosis not present

## 2023-10-04 DIAGNOSIS — E039 Hypothyroidism, unspecified: Secondary | ICD-10-CM | POA: Diagnosis not present

## 2023-10-04 DIAGNOSIS — R2689 Other abnormalities of gait and mobility: Secondary | ICD-10-CM | POA: Diagnosis not present

## 2023-10-04 DIAGNOSIS — E782 Mixed hyperlipidemia: Secondary | ICD-10-CM | POA: Diagnosis not present

## 2023-10-04 DIAGNOSIS — M62522 Muscle wasting and atrophy, not elsewhere classified, left upper arm: Secondary | ICD-10-CM | POA: Diagnosis not present

## 2023-10-04 DIAGNOSIS — R278 Other lack of coordination: Secondary | ICD-10-CM | POA: Diagnosis not present

## 2023-10-04 DIAGNOSIS — M6250 Muscle wasting and atrophy, not elsewhere classified, unspecified site: Secondary | ICD-10-CM | POA: Diagnosis not present

## 2023-10-04 DIAGNOSIS — J439 Emphysema, unspecified: Secondary | ICD-10-CM | POA: Diagnosis not present

## 2023-10-04 DIAGNOSIS — R296 Repeated falls: Secondary | ICD-10-CM | POA: Diagnosis not present

## 2023-10-05 ENCOUNTER — Other Ambulatory Visit

## 2023-10-05 DIAGNOSIS — M6250 Muscle wasting and atrophy, not elsewhere classified, unspecified site: Secondary | ICD-10-CM | POA: Diagnosis not present

## 2023-10-05 DIAGNOSIS — M62521 Muscle wasting and atrophy, not elsewhere classified, right upper arm: Secondary | ICD-10-CM | POA: Diagnosis not present

## 2023-10-05 DIAGNOSIS — R2681 Unsteadiness on feet: Secondary | ICD-10-CM | POA: Diagnosis not present

## 2023-10-05 DIAGNOSIS — R278 Other lack of coordination: Secondary | ICD-10-CM | POA: Diagnosis not present

## 2023-10-05 DIAGNOSIS — M62522 Muscle wasting and atrophy, not elsewhere classified, left upper arm: Secondary | ICD-10-CM | POA: Diagnosis not present

## 2023-10-05 DIAGNOSIS — R2689 Other abnormalities of gait and mobility: Secondary | ICD-10-CM | POA: Diagnosis not present

## 2023-10-05 DIAGNOSIS — R296 Repeated falls: Secondary | ICD-10-CM | POA: Diagnosis not present

## 2023-10-06 ENCOUNTER — Ambulatory Visit (INDEPENDENT_AMBULATORY_CARE_PROVIDER_SITE_OTHER)

## 2023-10-06 ENCOUNTER — Ambulatory Visit: Admitting: Primary Care

## 2023-10-06 ENCOUNTER — Other Ambulatory Visit: Payer: Self-pay

## 2023-10-06 ENCOUNTER — Encounter: Payer: Self-pay | Admitting: Primary Care

## 2023-10-06 ENCOUNTER — Other Ambulatory Visit (HOSPITAL_COMMUNITY): Payer: Self-pay

## 2023-10-06 VITALS — BP 117/79 | HR 81 | Temp 97.9°F | Ht 66.0 in | Wt 153.4 lb

## 2023-10-06 DIAGNOSIS — S81801A Unspecified open wound, right lower leg, initial encounter: Secondary | ICD-10-CM

## 2023-10-06 DIAGNOSIS — J45909 Unspecified asthma, uncomplicated: Secondary | ICD-10-CM | POA: Diagnosis not present

## 2023-10-06 DIAGNOSIS — J4551 Severe persistent asthma with (acute) exacerbation: Secondary | ICD-10-CM

## 2023-10-06 DIAGNOSIS — S81809S Unspecified open wound, unspecified lower leg, sequela: Secondary | ICD-10-CM

## 2023-10-06 DIAGNOSIS — Z7189 Other specified counseling: Secondary | ICD-10-CM

## 2023-10-06 DIAGNOSIS — J449 Chronic obstructive pulmonary disease, unspecified: Secondary | ICD-10-CM

## 2023-10-06 DIAGNOSIS — R911 Solitary pulmonary nodule: Secondary | ICD-10-CM

## 2023-10-06 MED ORDER — BREZTRI AEROSPHERE 160-9-4.8 MCG/ACT IN AERO: 2.0000 | INHALATION_SPRAY | Freq: Two times a day (BID) | RESPIRATORY_TRACT | 3 refills | Status: AC

## 2023-10-06 MED ORDER — AZITHROMYCIN 250 MG PO TABS: 250.0000 mg | ORAL_TABLET | ORAL | 3 refills | Status: DC

## 2023-10-06 MED ORDER — ALBUTEROL SULFATE HFA 108 (90 BASE) MCG/ACT IN AERS: 2.0000 | INHALATION_SPRAY | Freq: Four times a day (QID) | RESPIRATORY_TRACT | 3 refills | Status: DC | PRN

## 2023-10-06 MED ORDER — NUCALA 100 MG/ML ~~LOC~~ SOAJ
100.0000 mg | SUBCUTANEOUS | 3 refills | Status: AC
  Filled 2023-10-06 – 2023-10-27 (×2): qty 1, 28d supply, fill #0
  Filled 2023-11-18: qty 1, 28d supply, fill #1
  Filled 2023-12-20: qty 1, 28d supply, fill #2
  Filled 2024-01-17 – 2024-01-19 (×3): qty 1, 28d supply, fill #3

## 2023-10-06 NOTE — Progress Notes (Signed)
 Patient started Nucala  10/06/23 in clinic. Able to self-administer. Tolerated well.   PLAN Continue Nucala  100mg  every 4 weeks. Next dose is due 11/03/23 and every 4 weeks thereafter. Rx sent to: [facility]: [phone] . Patient provided with pharmacy phone number. Continue maintenance regimen of: Breztri  160-9-4.8 mcg/act (Inhale 2 puffs into the lungs every 12 (twelve) hours), azithromycin  250mg  tab (Take 1 tablet (250 mg total) by mouth every Monday, Wednesday, and Friday), and any other medications as prescribed by pulmonary team    All questions encouraged and answered.  Instructed patient to reach out with any further questions or concerns.  Aleck Puls, PharmD, BCPS, CPP Clinical Pharmacist  [facility]

## 2023-10-06 NOTE — Progress Notes (Signed)
 HPI Patient presents today to [facility] to see pharmacy team for Nucala  new start.  Past medical history includes COPD and asthma. Previously seen by Dr. Brenna for severe COPD on triple therapy and azithromycin  MWF. Seen by Landry Ferrari, NP, on 08/31/23. At that time, plan to add on Nucala  to current medications.  Two hospitalizations with COPD exacerbation this year noted, most recently on 09/29/23 to 09/30/23. Of note, she also had a fall earlier this year that resulted in hospitalization with bilateral sacral insufficiency fractures and was discharged to SNF on 08/19/23.   Respiratory Medications Current regimen:   - Breztri  160-9-4.8 mcg/act (Inhale 2 puffs into the lungs every 12 (twelve) hours),  - azithromycin  250mg  tab (Take 1 tablet (250 mg total) by mouth every Monday, Wednesday, and Friday) - albuterol  (Ventolin  BFA) 108 mcg/act (Inhale 2 puffs into the lungs every 6 (six) hours as needed for wheezing or shortness of breath)  Patient reports no known adherence challenges  OBJECTIVE Allergies  Allergen Reactions   Sulfa Antibiotics Anaphylaxis, Hives and Dermatitis   Sulfonamide Derivatives Anaphylaxis, Swelling, Dermatitis, Rash and Other (See Comments)    Throat closes   Dilaudid  [Hydromorphone  Hcl] Nausea And Vomiting   Fosamax [Alendronate Sodium] Other (See Comments)    GI intolerence   Molds & Smuts Itching and Other (See Comments)    Sinus and eye irritation   Pollen Extract Itching and Other (See Comments)    Sinus and eye irritation   Wound Dressing Adhesive Other (See Comments)    Redness Skin tears easily  OK to use paper tape    Outpatient Encounter Medications as of 10/06/2023  Medication Sig Note   acetaminophen  (TYLENOL ) 500 MG tablet Take 1,000 mg by mouth 2 (two) times daily as needed for headache or fever (pain).    albuterol  (VENTOLIN  HFA) 108 (90 Base) MCG/ACT inhaler Inhale 2 puffs into the lungs every 6 (six) hours as needed for wheezing or  shortness of breath.    azithromycin  (ZITHROMAX ) 250 MG tablet Take 1 tablet (250 mg total) by mouth every Monday, Wednesday, and Friday.    budeson-glycopyrrolate -formoterol  (BREZTRI  AEROSPHERE) 160-9-4.8 MCG/ACT AERO Inhale 2 puffs into the lungs every 12 (twelve) hours.    buPROPion  (WELLBUTRIN  XL) 300 MG 24 hr tablet Take 300 mg by mouth at bedtime.    Calcium  500 MG CHEW Chew 1 tablet (500 mg total) by mouth 2 (two) times daily.    Calcium  Carb-Cholecalciferol  (CALCIUM  + VITAMIN D3 PO) Take 1 each by mouth 2 (two) times daily. OTC calcium  + vitamin d3 gummy    carbidopa -levodopa  (SINEMET  IR) 25-100 MG tablet TAKE 1 TABLET BY MOUTH 3 TIMES A DAY (Patient taking differently: Take 1 tablet by mouth See admin instructions. Take 1 tablet by mouth three times daily - 0800, 1200, 1700.)    carboxymethylcellulose (REFRESH PLUS) 0.5 % SOLN Place 1 drop into both eyes 2 (two) times daily.    Cholecalciferol  (VITAMIN D -3 PO) Take 1 capsule by mouth daily.    Cyanocobalamin  (VITAMIN B 12 PO) Take 1 tablet by mouth at bedtime.    feeding supplement (ENSURE PLUS HIGH PROTEIN) LIQD Take 237 mLs by mouth 2 (two) times daily between meals. (Patient taking differently: Take 237 mLs by mouth daily.)    furosemide  (LASIX ) 20 MG tablet Take 20-40 mg by mouth See admin instructions. Take 1 tablet (20mg ) by mouth once daily as needed for excessive swelling. Take 2 tablets (40mg ) once daily for 3 days starting 09/28/23  then resume as needed. 09/30/2023: Patient states she had been taking 40mg  for 2 days prior to admission on 9/24.   levothyroxine  (SYNTHROID ) 88 MCG tablet Take 88 mcg by mouth daily before breakfast. 09/30/2023: Patient confirmed current dose is . Most recent Rx filled on dispense report is for 100mcg (autofill).   Mepolizumab  (NUCALA ) 100 MG/ML SOAJ Inject contents of one pen (100mg ) in the skin on day 0 in clinic, and every 28 days thereafter. Courier to pulm: [address], [place]  [address]. Appt on 09/23/23. (Patient not taking: Reported on 09/30/2023)    methocarbamol  (ROBAXIN ) 750 MG tablet Take 750 mg by mouth 2 (two) times daily as needed for muscle spasms.    [Paused] metoprolol  succinate (TOPROL -XL) 25 MG 24 hr tablet Take 25 mg by mouth at bedtime.    omeprazole  (PRILOSEC) 20 MG capsule TAKE 1 CAPSULE BY MOUTH EVERY DAY (Patient taking differently: Take 20 mg by mouth at bedtime.)    oxyCODONE  (OXY IR/ROXICODONE ) 5 MG immediate release tablet Take 5 mg by mouth every 4 (four) hours as needed for severe pain (pain score 7-10).    polyethylene glycol (MIRALAX  / GLYCOLAX ) 17 g packet Take 17 g by mouth daily as needed for moderate constipation.    potassium chloride  (KLOR-CON  M) 10 MEQ tablet Take 1 tablet (10 mEq total) by mouth daily as needed (when you take the lasix ). (Patient taking differently: Take 10 mEq by mouth See admin instructions. Take 1 tablet (10mEq) by mouth once daily as needed with furosemide . Take 2 tablets (20mEq) once daily for 3 days starting 09/28/23 then resume as needed.) 09/30/2023: Patient states she had been taking 20mEq for 2 days prior to admission on 9/24.   rosuvastatin  (CRESTOR ) 10 MG tablet Take 1 tablet (10 mg total) by mouth at bedtime.    sertraline  (ZOLOFT ) 50 MG tablet Take 50 mg by mouth at bedtime.    SYSTANE ULTRA 0.4-0.3 % SOLN Place 1 drop into both eyes 2 (two) times daily.    tiZANidine  (ZANAFLEX ) 4 MG tablet Take 4 mg by mouth daily as needed (severe muscle spasms).    [Paused] valsartan  (DIOVAN ) 40 MG tablet Take 40 mg by mouth at bedtime. 09/30/2023: discontinued 7/23, per discharge paperwork - patient states she is still taking this medication daily.   No facility-administered encounter medications on file as of 10/06/2023.     Immunization History  Administered Date(s) Administered   Fluad Quad(high Dose 65+) 08/26/2018, 11/24/2019, 09/30/2020, 10/08/2021   Fluad Trivalent(High Dose 65+) 10/05/2022   H1N1 12/07/2007    Influenza Split 10/15/2010   Influenza, Seasonal, Injecte, Preservative Fre 09/26/2009   Influenza,inj,Quad PF,6+ Mos 10/05/2012, 10/24/2013, 10/12/2014, 11/12/2015, 10/16/2016, 10/14/2017   Influenza-Unspecified 11/10/2004, 10/20/2005, 10/25/2006, 10/18/2007, 10/09/2008, 08/26/2018   PFIZER Comirnaty(Gray Top)Covid-19 Tri-Sucrose Vaccine 07/30/2020   PFIZER(Purple Top)SARS-COV-2 Vaccination 03/05/2019, 04/04/2019, 10/27/2019, 07/30/2020   Pneumococcal Conjugate-13 10/26/2018   Pneumococcal Polysaccharide-23 11/10/2004, 01/05/2009, 09/26/2009, 11/24/2019   Tdap 10/05/2006, 04/06/2011, 07/21/2023   Zoster Recombinant(Shingrix) 08/20/2020, 09/05/2020, 01/05/2021, 01/23/2021     PFTs    Latest Ref Rng & Units 12/07/2018    8:53 AM  PFT Results  FVC-Pre L 1.73   FVC-Predicted Pre % 49   FVC-Post L 1.99   FVC-Predicted Post % 56   Pre FEV1/FVC % % 46   Post FEV1/FCV % % 47   FEV1-Pre L 0.80   FEV1-Predicted Pre % 29   FEV1-Post L 0.93   DLCO uncorrected ml/min/mmHg 10.76   DLCO  UNC% % 49   DLVA Predicted % 73   TLC L 6.80   TLC % Predicted % 123   RV % Predicted % 222      Eosinophils Most recent blood eosinophil count was 100 cells/microL taken on 08/31/23.   IgE: 13 on 09/03/23   Assessment   Biologics training for mepolizumab  (Nucala )  Goals of therapy: Mechanism of Action: Not fully understood. It does act an interleukin-5 (IL-5) antagonist monoclonal antibody that reduces the production and survival of eosinophils by blocking the binding of IL-5 to the alpha chain of the receptor complex on the eosinophil cell surface. Reviewed that Nucala  is add-on medication and patient must continue maintenance inhaler regimen. Response to therapy: may take 3 months to 6 months to determine efficacy. Discussed that patients generally feel improvement sooner than 3 months.  Side effects: headache (19%), injection site reaction (7-15%), antibody development (6%), backache (5%), fatigue  (5%)  Dose: 100 mg subcutaneously every 4 weeks  Administration/Storage:  Reviewed administration sites of thigh or abdomen (at least 2-3 inches away from abdomen). Reviewed the upper arm is only appropriate if caregiver is administering injection  Do not shake the reconstituted solution as this could lead to product foaming or precipitation. Solution should be clear to opalescent and colorless to pale yellow or pale brown, essentially particle free. Small air bubbles, however, are expected and acceptable. If particulate matter remains in the solution or if the solution appears cloudy or milky, discard the solution.  Reviewed storage of medication in refrigerator. Reviewed that Nucala  can be stored at room temperature in unopened carton for up to 7 days.  Access: Approval of Nucala  through: insurance  Patient self-administered Nucala  100mg /mL in right lower abdomen using [facility]-supplied medication.  Nucala  100mg /mL autoinjector pen NDC: [phone] Lot: [id number] Expiration: 2028-JAN  Patient monitored for 30 minutes for adverse reaction.  Patient tolerated well. Patient denies itchiness and irritation at injection.  Medication Reconciliation  A drug regimen assessment was performed, including review of allergies, interactions, disease-state management, dosing and immunization history. Medications were reviewed with the patient, including name, instructions, indication, goals of therapy, potential side effects, importance of adherence, and safe use.  Drug interaction(s): none noted   PLAN Continue Nucala  100mg  every 4 weeks. Next dose is due 11/03/23 and every 4 weeks thereafter. Rx sent to: [facility]: [phone] . Patient provided with pharmacy phone number. Continue maintenance regimen of: Breztri  160-9-4.8 mcg/act (Inhale 2 puffs into the lungs every 12 (twelve) hours), azithromycin  250mg  tab (Take 1 tablet (250 mg total) by mouth every Monday, Wednesday, and  Friday), and any other medications as prescribed by pulmonary team   All questions encouraged and answered.  Instructed patient to reach out with any further questions or concerns.  Thank you for allowing pharmacy to participate in this patient's care.  This appointment required 45 minutes of patient care (this includes precharting, chart review, review of results, face-to-face care, etc.).

## 2023-10-06 NOTE — Patient Instructions (Addendum)
 Your next Nucala  dose is due on 11/03/23, 12/01/23, and every 28 days thereafter  CONTINUE Breztri  160-9-4.8 mcg/act (Inhale 2 puffs into the lungs every 12 (twelve) hours), azithromycin  250mg  tab (Take 1 tablet (250 mg total) by mouth every Monday, Wednesday, and Friday, and other medications as prescribed by your pulmonology team.   Your prescription will be shipped from [facility]. Their phone number is [phone]. Someone will call to schedule shipment and confirm address. They will mail your medication to your home.  You will need to be seen by your provider in 3 to 4 months to assess how Nucala  is working for you. Please ensure you have a follow-up appointment scheduled in November 2025. You will see a new MD to establish care since Dr. Brenna is no longer with our practice. Call our clinic if you need to make this appointment. [phone].   Stay up to date on all routine vaccines: influenza, pneumonia, COVID19, Shingles  How to manage an injection site reaction: Remember the 5 C's: COUNTER - leave on the counter at least 30 minutes but up to overnight to bring medication to room temperature. This may help prevent stinging COLD - place something cold (like an ice gel pack or cold water bottle) on the injection site just before cleansing with alcohol . This may help reduce pain CLARITIN - use Claritin (generic name is loratadine) for the first two weeks of treatment or the day of, the day before, and the day after injecting. This will help to minimize injection site reactions CORTISONE CREAM - apply if injection site is irritated and itching CALL ME - if injection site reaction is bigger than the size of your fist, looks infected, blisters, or if you develop hives

## 2023-10-06 NOTE — Progress Notes (Signed)
 @Patient  ID: April [name], female    DOB: 11-15-1955, 68 y.o.   MRN: [id number]  Chief Complaint  Patient presents with   Asthma    8-12 week    Referring provider: Tonnie Jon SAILOR, NP  HPI: 68 year old, former smoker. Former Dr. Brenna patient followed for severe COPD on triple therapy and Azithromycin  MWF   Previous [facility] pulmonary encounter OV 09/16/2022: Here today for follow-up regarding COPD management.She is also enrolled in lung cancer screening and has a repeat lung cancer screening CT due in December 2024.  Last office visit we discussed her ongoing physical therapy needs.  Using triple therapy inhaler regimen as well as azithromycin  Monday Wednesday Friday.  Doing well with her current regimen with no recent exacerbations.    03/30/2023 Discussed the use of AI scribe software for clinical note transcription with the patient, who gave verbal consent to proceed.   History of Present Illness   April [name] is a 68 year old female with COPD and a lung nodule who presents for follow-up.   She has severe COPD with an asthmatic component and experiences increased wheezing. She uses Breztri , taking two puffs in the morning and evening, but sometimes requires three puffs to alleviate wheezing. She also uses azithromycin  on Monday, Wednesday, and Friday. A nebulizer is available at home, and she has a history of long-term prednisone  use in the 1990s.   She is part of a lung cancer screening program due to her history as a former smoker, having quit six to seven years ago. Her last CT scan in December 2024 showed some scarring and emphysema but no concerning lung nodules. A calcified granuloma measuring six millimeters is present in the right upper lung, which has not changed in size.   She is experiencing balance issues and has fallen recently, resulting in bruises, particularly on her left arm. She uses a walker for mobility and has undergone extensive testing to determine the  cause of her balance problems. She is scheduled for a brain scan with neurology on April 1st to further investigate these issues.   Her kidney function was partially compromised with a glomerular filtration rate of 47, as noted in her labs from December.   She is concerned about the cost of medications, noting that Breztri  was initially expensive, and is cautious about the cost of new medications.       08/31/2023 Discussed the use of AI scribe software for clinical note transcription with the patient, who gave verbal consent to proceed.   History of Present Illness April [name] is a 68 year old female with COPD and asthma who presents for follow-up after a recent hospitalization for COPD exacerbation.   She experienced breathing issues on July 17th, with oxygen  levels dropping to 82% on room air due to extreme heat. She was hospitalized from July 17th to July 23rd for an acute exacerbation of COPD and received treatment with steroids, bronchodilators, and oxygen . She was weaned off oxygen  and transitioned to oral prednisone  before discharge.   Currently, she uses Breztri , two puffs in the morning and two puffs in the evening, and has a rescue inhaler (albuterol ) for use every six hours as needed. Two weeks ago, she started using supplemental oxygen  at two liters, particularly at bedtime, as her oxygen  levels drop when she is active. Her oxygen  levels were at 92% without oxygen  during sleep.   During her hospitalization, an echocardiogram showed an ejection fraction of 50-55% with mild  diastolic dysfunction. A CTA of the chest revealed emphysema but no pneumonia or pulmonary embolism. Her breathing test in 2020 indicated severe obstructive lung disease with an asthmatic component. She is currently in a general rehabilitation program, which includes some pulmonary rehabilitation exercises.   She reports occasional upper airway nasal congestion but not usually. Her eosinophil count in August was  200, and she has previously administered self-injections, indicating comfort with potential future treatments.    10/06/2023- Interim hx  Discussed the use of AI scribe software for clinical note transcription with the patient, who gave verbal consent to proceed.  History of Present Illness April [name] is a 68 year old female with severe COPD and asthma who presents for follow-up after recent hospitalizations for COPD exacerbations.  She has a history of severe COPD and asthma, with recent hospitalizations due to COPD exacerbations. Her first hospitalization lasted from July 17th to August 25th, followed by a second hospitalization on September 24th for two days. During these hospitalizations, she was initiated on oxygen  therapy, which she uses at her discretion. She completed a course of prednisone , finishing it yesterday.  Her current medication regimen includes Breztri , azithromycin  taken on Monday, Wednesday, and Friday, and albuterol  as needed. She also uses a portable oxygen  concentrator set at two liters. She reports improvement in her breathing since the last hospitalization and performs breathing treatments every six hours. No current cough or mucus production is noted, with only occasional wheezing.  She has two open wounds on her lower legs, present for nearly two weeks. She was supposed to be referred to wound but has not heard from them yet.   Her medication regimen also includes Wellbutrin , calcium , Sinemet , vitamin D , B12, Lasix  as needed, Synthroid , methocarbamol  as needed, metoprolol  as needed, omeprazole  occasionally, Miralax  as needed, potassium twice a day, Crestor , and sertraline . She takes tizanidine  as needed. Her blood pressure medication, valsartan , is currently paused, and she is taking metoprolol  for blood pressure management.   Pulmonary testing:   12/07/2018 PFTs>> FEV1 0.93 (34%), ratio 47 / Severe obstruction with significant BD response    Imaging: 12/14/22  LDCT >> Lung RADS 1, negative. Biapical pleuroparenchymal scarring. Centrilobular emphysema. 6mm calcified granuloma RUL   07/22/23 CTA>>  Emphysematous changes of the lower lungs are seen. No focal infiltrate or sizable effusion is noted. Scarring in the right apex is again identified. No other focal abnormality is noted.  Allergies  Allergen Reactions   Sulfa Antibiotics Anaphylaxis, Hives and Dermatitis   Sulfonamide Derivatives Anaphylaxis, Swelling, Dermatitis, Rash and Other (See Comments)    Throat closes   Dilaudid  [Hydromorphone  Hcl] Nausea And Vomiting   Fosamax [Alendronate Sodium] Other (See Comments)    GI intolerence   Molds & Smuts Itching and Other (See Comments)    Sinus and eye irritation   Pollen Extract Itching and Other (See Comments)    Sinus and eye irritation   Wound Dressing Adhesive Other (See Comments)    Redness Skin tears easily  OK to use paper tape    Immunization History  Administered Date(s) Administered   Fluad Quad(high Dose 65+) 08/26/2018, 11/24/2019, 09/30/2020, 10/08/2021   Fluad Trivalent(High Dose 65+) 10/05/2022   H1N1 12/07/2007   Influenza Split 10/15/2010   Influenza, Seasonal, Injecte, Preservative Fre 09/26/2009   Influenza,inj,Quad PF,6+ Mos 10/05/2012, 10/24/2013, 10/12/2014, 11/12/2015, 10/16/2016, 10/14/2017   Influenza-Unspecified 11/10/2004, 10/20/2005, 10/25/2006, 10/18/2007, 10/09/2008, 08/26/2018   PFIZER Comirnaty(Gray Top)Covid-19 Tri-Sucrose Vaccine 07/30/2020   PFIZER(Purple Top)SARS-COV-2 Vaccination 03/05/2019, 04/04/2019, 10/27/2019, 07/30/2020  Pneumococcal Conjugate-13 10/26/2018   Pneumococcal Polysaccharide-23 11/10/2004, 01/05/2009, 09/26/2009, 11/24/2019   Tdap 10/05/2006, 04/06/2011, 07/21/2023   Zoster Recombinant(Shingrix) 08/20/2020, 09/05/2020, 01/05/2021, 01/23/2021    Past Medical History:  Diagnosis Date   Allergy    Anemia    Anxiety    Anxiety and depression    related to caring for mother  during terminal illness   Arthritis    Asthma    AVN (avascular necrosis of bone) (HCC)    hip and wrist   Bronchitis    hx of   Cataract    Chronic kidney disease    COPD (chronic obstructive pulmonary disease) (HCC)    Depression    Emphysema of lung (HCC)    Endometriosis    FH: CAD (coronary artery disease)    GERD (gastroesophageal reflux disease)    ocassional   History of blood transfusion    after hip repackment - broke out in hives and started itching   History of palpitations    Hyperlipidemia    Hypertension    Hypothyroid    Neuromuscular disorder (HCC)    Non-ischemic cardiomyopathy (HCC) 2019   Osteopenia    forteo through Dr. Dolphus (started 10/12)   Osteoporosis    PMR (polymyalgia rheumatica)    SIRS (systemic inflammatory response syndrome) (HCC) 10/2017   Smoker    SOB (shortness of breath) on exertion    Squamous cell carcinoma    facial, 2016   Temporal arteritis (HCC)    s/p prednisone  taper    Tobacco History: Social History   Tobacco Use  Smoking Status Former   Current packs/day: 0.00   Average packs/day: 0.3 packs/day for 34.0 years (11.2 ttl pk-yrs)   Types: Cigarettes   Start date: 10/25/1983   Quit date: 10/24/2017   Years since quitting: 5.9   Passive exposure: Past (dad smoked)  Smokeless Tobacco Never  Tobacco Comments   quit smoking october 2019   Counseling given: Not Answered Tobacco comments: quit smoking october 2019   Outpatient Medications Prior to Visit  Medication Sig Dispense Refill   acetaminophen  (TYLENOL ) 500 MG tablet Take 1,000 mg by mouth 2 (two) times daily as needed for headache or fever (pain).     albuterol  (VENTOLIN  HFA) 108 (90 Base) MCG/ACT inhaler Inhale 2 puffs into the lungs every 6 (six) hours as needed for wheezing or shortness of breath.     azithromycin  (ZITHROMAX ) 250 MG tablet Take 1 tablet (250 mg total) by mouth every Monday, Wednesday, and Friday. 36 tablet 3    budeson-glycopyrrolate -formoterol  (BREZTRI  AEROSPHERE) 160-9-4.8 MCG/ACT AERO Inhale 2 puffs into the lungs every 12 (twelve) hours. 32.1 g 3   buPROPion  (WELLBUTRIN  XL) 300 MG 24 hr tablet Take 300 mg by mouth at bedtime.     Calcium  Carbonate-Vit D-Min (CALCIUM  1200) 1200-1000 MG-UNIT CHEW 1 tablet Orally Once a day     carbidopa -levodopa  (SINEMET  IR) 25-100 MG tablet TAKE 1 TABLET BY MOUTH 3 TIMES A DAY (Patient taking differently: Take 1 tablet by mouth See admin instructions. Take 1 tablet by mouth three times daily - 0800, 1200, 1700.) 270 tablet 1   Cyanocobalamin  (VITAMIN B 12 PO) Take 1 tablet by mouth at bedtime.     furosemide  (LASIX ) 20 MG tablet Take 20-40 mg by mouth See admin instructions. Take 1 tablet (20mg ) by mouth once daily as needed for excessive swelling. Take 2 tablets (40mg ) once daily for 3 days starting 09/28/23 then resume as needed.     levothyroxine  (  SYNTHROID ) 88 MCG tablet Take 88 mcg by mouth daily before breakfast.     Mepolizumab  (NUCALA ) 100 MG/ML SOAJ Inject contents of one pen (100mg ) in the skin on day 0 in clinic, and every 28 days thereafter. Courier to pulm: [address], [address], [place] [address]. Appt on 09/23/23. 1 mL 0   methocarbamol  (ROBAXIN ) 750 MG tablet Take 750 mg by mouth 2 (two) times daily as needed for muscle spasms.     metoprolol  succinate (TOPROL -XL) 25 MG 24 hr tablet Take 25 mg by mouth at bedtime.     omeprazole  (PRILOSEC) 20 MG capsule TAKE 1 CAPSULE BY MOUTH EVERY DAY 90 capsule 3   polyethylene glycol (MIRALAX  / GLYCOLAX ) 17 g packet Take 17 g by mouth daily as needed for moderate constipation.     potassium chloride  (KLOR-CON  M) 10 MEQ tablet Take 1 tablet (10 mEq total) by mouth daily as needed (when you take the lasix ). (Patient taking differently: Take 10 mEq by mouth See admin instructions. Take 1 tablet ( ) by mouth once daily as needed with furosemide . Take 2 tablets ( ) once daily for 3 days starting 09/28/23 then  resume as needed.) 30 tablet 3   rosuvastatin  (CRESTOR ) 10 MG tablet Take 1 tablet (10 mg total) by mouth at bedtime. 90 tablet 3   sertraline  (ZOLOFT ) 50 MG tablet Take 50 mg by mouth at bedtime.     SYSTANE ULTRA 0.4-0.3 % SOLN Place 1 drop into both eyes 2 (two) times daily. 30 mL 0   tiZANidine  (ZANAFLEX ) 4 MG tablet Take 4 mg by mouth daily as needed (severe muscle spasms).     valsartan  (DIOVAN ) 40 MG tablet Take 40 mg by mouth at bedtime.     Calcium  Carb-Cholecalciferol  (CALCIUM  + VITAMIN D3 PO) Take 1 each by mouth 2 (two) times daily. OTC calcium  + vitamin d3 gummy     carboxymethylcellulose (REFRESH PLUS) 0.5 % SOLN Place 1 drop into both eyes 2 (two) times daily.     Cholecalciferol  (VITAMIN D -3 PO) Take 1 capsule by mouth daily.     potassium chloride  (KLOR-CON ) 10 MEQ tablet 1 tablet with food Orally Twice a day     Calcium  500 MG CHEW Chew 1 tablet (500 mg total) by mouth 2 (two) times daily. (Patient not taking: Reported on 10/06/2023) 30 tablet 0   feeding supplement (ENSURE PLUS HIGH PROTEIN) LIQD Take 237 mLs by mouth 2 (two) times daily between meals. (Patient taking differently: Take 237 mLs by mouth daily.)     oxyCODONE  (OXY IR/ROXICODONE ) 5 MG immediate release tablet Take 5 mg by mouth every 4 (four) hours as needed for severe pain (pain score 7-10).     No facility-administered medications prior to visit.   Review of Systems  Review of Systems  Constitutional: Negative.   HENT: Negative.    Respiratory:  Negative for cough, shortness of breath and wheezing.   Cardiovascular:  Positive for leg swelling.  Neurological:  Positive for weakness.   Physical Exam  BP 117/79   Pulse 81   Temp 97.9 F (36.6 C) (Oral)   Ht 5' 6 (1.676 m)   Wt 153 lb 6.4 oz (69.6 kg)   SpO2 95%   BMI 24.76 kg/m  Physical Exam Constitutional:      General: She is not in acute distress.    Appearance: Normal appearance.  HENT:     Head: Normocephalic and atraumatic.   Cardiovascular:     Rate and  Rhythm: Normal rate and regular rhythm.  Pulmonary:     Effort: Pulmonary effort is normal.     Breath sounds: Normal breath sounds. No wheezing, rhonchi or rales.  Musculoskeletal:     Cervical back: Normal range of motion and neck supple.     Right lower leg: Edema present.     Left lower leg: Edema present.     Comments: In transport wheelchair  Skin:    Comments: Scattered bruising and superficial wounds to bilateral lower extremities R>L  Neurological:     General: No focal deficit present.     Mental Status: She is alert and oriented to person, place, and time. Mental status is at baseline.  Psychiatric:        Mood and Affect: Mood normal.        Behavior: Behavior normal.        Thought Content: Thought content normal.        Judgment: Judgment normal.       Lab Results:  CBC    Component Value Date/Time   WBC 4.0 09/30/2023 0206   RBC 3.26 (L) 09/30/2023 0206   HGB 9.9 (L) 09/30/2023 0206   HGB 11.5 08/31/2023 1211   HCT 31.3 (L) 09/30/2023 0206   HCT 35.9 08/31/2023 1211   PLT 175 09/30/2023 0206   PLT 262 08/31/2023 1211   MCV 96.0 09/30/2023 0206   MCV 98 (H) 08/31/2023 1211   MCH 30.4 09/30/2023 0206   MCHC 31.6 09/30/2023 0206   RDW 14.2 09/30/2023 0206   RDW 13.3 08/31/2023 1211   LYMPHSABS 0.7 09/29/2023 1304   LYMPHSABS 0.5 (L) 08/31/2023 1211   MONOABS 0.5 09/29/2023 1304   EOSABS 0.3 09/29/2023 1304   EOSABS 0.1 08/31/2023 1211   BASOSABS 0.1 09/29/2023 1304   BASOSABS 0.0 08/31/2023 1211    BMET    Component Value Date/Time   NA 137 09/30/2023 0206   NA 138 01/31/2021 1029   K 4.1 09/30/2023 0206   CL 106 09/30/2023 0206   CO2 23 09/30/2023 0206   GLUCOSE 129 (H) 09/30/2023 0206   BUN 27 (H) 09/30/2023 0206   BUN 15 01/31/2021 1029   CREATININE 1.58 (H) 09/30/2023 0206   CREATININE 1.13 (H) 06/03/2016 1213   CALCIUM  8.6 (L) 09/30/2023 0206   GFRNONAA 35 (L) 09/30/2023 0206   GFRNONAA 52 (L)  06/03/2016 1213   GFRAA 50 (L) 06/13/2019 0955   GFRAA 60 06/03/2016 1213    BNP    Component Value Date/Time   BNP 8.8 09/29/2023 1304    ProBNP    Component Value Date/Time   PROBNP 198.0 07/22/2023 1811   PROBNP 35.0 08/25/2019 0815    Imaging: DG Chest Portable 1 View Result Date: 09/29/2023 EXAM: 1 VIEW(S) XRAY OF THE CHEST 09/29/2023 01:30:00 PM COMPARISON: 07/22/2023 CLINICAL HISTORY: weakness, SOB FINDINGS: LUNGS AND PLEURA: Lung hyperinflation. No focal pulmonary opacity. No pulmonary edema. No pleural effusion. No pneumothorax. HEART AND MEDIASTINUM: Atherosclerotic plaque. No acute abnormality of the cardiac and mediastinal silhouettes. BONES AND SOFT TISSUES: Left shoulder arthroplasty. No acute osseous abnormality. IMPRESSION: 1. No acute findings. 2. Lung hyperinflation with interstitial changes concerning for emphysema. . 3. Atherosclerotic plaque. Electronically signed by: Waddell Calk MD 09/29/2023 02:01 PM EDT RP Workstation: [id number]     Assessment & Plan:   No problem-specific Assessment & Plan notes found for this encounter.   1. COPD, severe (HCC) (Primary)  2. Uncomplicated asthma, unspecified asthma severity, unspecified whether persistent  3.  Pulmonary nodule  4. Non-healing wound of lower extremity, unspecified laterality, sequela - AMB referral to wound care center   Assessment and Plan Assessment & Plan Severe COPD/asthma with recent exacerbations Severe COPD/asthma with recurrent exacerbations requiring oral prednisone  along with recent hospitalization on September 24th for acute respiratory failure. Currently on Breztri , azithromycin  MWF, and albuterol . Eosinophil count was 300 in September, indicating an allergic component. Chest X-ray showed hyperinflated lungs consistent with emphysema, no acute findings. Finished prednisone  course on 9/29. Lung sounds clear on exam today. No current cough or mucus production, occasional wheezing. Unable  to afford ohtuvayre  (PDE4 inhibitor), decision made to add Nucala  to reduce flare ups.  - Continue Breztri  two puffs q 12 hours, azithromycin  MWF, and albuterol  as prescribed. - Discuss Nucala  with pharmacist at 1 PM appointment to reduce asthma exacerbations  - Use oxygen  therapy as needed, with portable concentrator set at 2 liters to maintain O2 >88%  - Continue physical therapy at home. - Check oxygen  level with sit and stand test.   Nonhealing wounds of right lower extremity Nonhealing wounds on the right lower extremity, present for two weeks. Two open wounds with surrounding bruises, one wound previously oozing but now sealed. Referral to wound care was planned but not yet completed. - Place referral to wound care center for evaluation and management of nonhealing wounds.   Almarie LELON Ferrari, NP 10/06/2023

## 2023-10-06 NOTE — Patient Instructions (Addendum)
  VISIT SUMMARY: Today, you came in for a follow-up visit after recent hospitalizations for COPD exacerbations. We discussed your current medications, oxygen  therapy, and the status of your breathing. You also mentioned having two open wounds on your lower legs that have been present for nearly two weeks.  YOUR PLAN: -SEVERE ASTHMA WITH ALLERGIC COMPONENT AND CHRONIC OBSTRUCTIVE PULMONARY DISEASE (COPD) WITH RECENT EXACERBATIONS: You have severe asthma with an allergic component and COPD, which have recently caused you to be hospitalized. Continue taking Breztri , azithromycin , and albuterol  as prescribed. We will discuss Nucala  with the pharmacist at your 1 PM appointment to help reduce asthma/COPD exacerbations. Use your oxygen  therapy as needed, with your portable concentrator set at 2 liters. Continue your physical therapy at home and check your oxygen  level with a sit and stand test.  -NONHEALING WOUNDS OF RIGHT LOWER EXTREMITY: You have two open wounds on your right lower leg that have not healed in nearly two weeks. We will place a referral to a wound care center for evaluation and management of these wounds.  INSTRUCTIONS: Please follow up with the pharmacist at 1 PM to discuss Nucala . Additionally, attend the wound care center as soon as you receive the referral for your nonhealing wounds.  Follow-up 6-8 weeks with one of the new MD to establish care (asthma/COPD, chronic respiratory failure)

## 2023-10-07 IMAGING — CT CT CHEST LUNG CANCER SCREENING LOW DOSE W/O CM
1 series · 10 of 10 positions shown, 13 images · non-contrast
Comparison: CT chest 11/13/2019 CT AP and CT chest 07/04/2018

CLINICAL DATA: Lung cancer screening. Former asymptomatic smoker
with 33 pack-year history.

EXAM:
CT CHEST WITHOUT CONTRAST LOW-DOSE FOR LUNG CANCER SCREENING
TECHNIQUE: Multidetector CT imaging of the chest was performed following the
standard protocol without IV contrast.

[ct lung segmentation data · axial · 0.69mm/px · z∈[+908,+908]mm · 10 of 353 frames shown]
[frame 1/353  mediastinal]
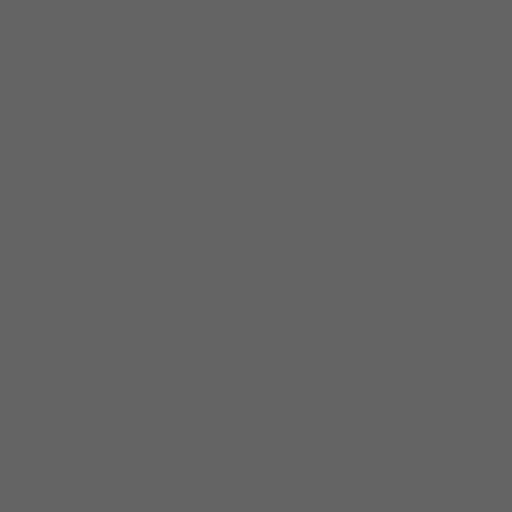
[frame 1/353  lung]
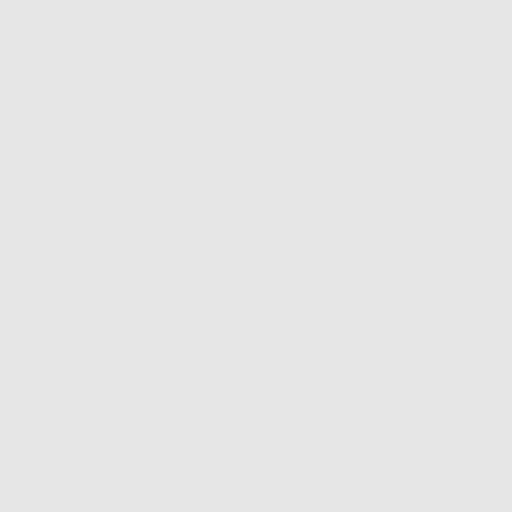
[frame 40/353  lung]
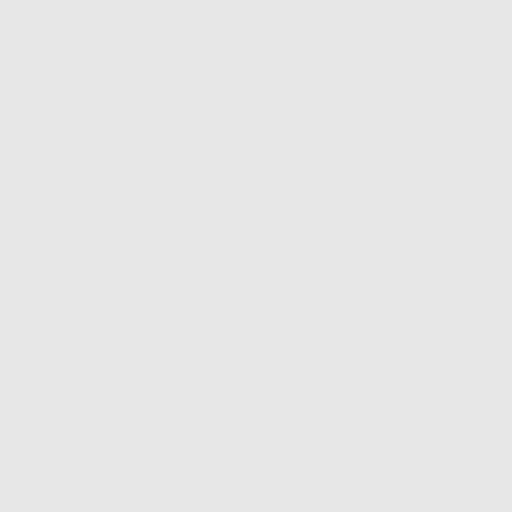
[frame 79/353  lung]
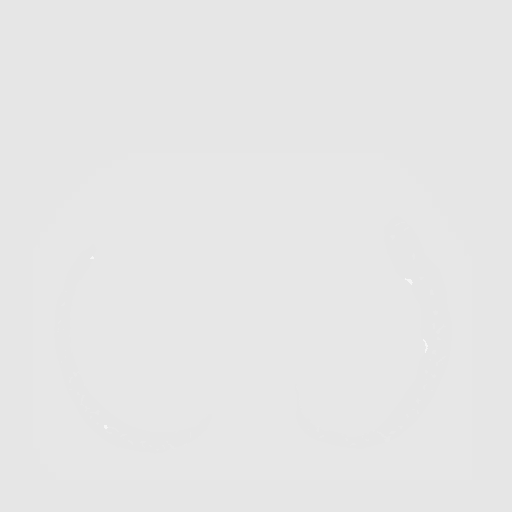
[frame 118/353  lung]
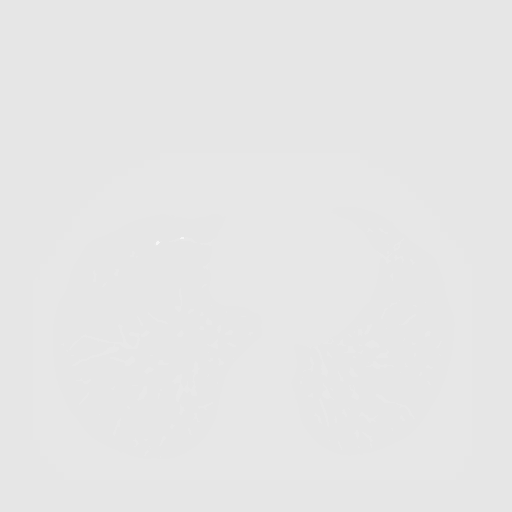
[frame 157/353  mediastinal]
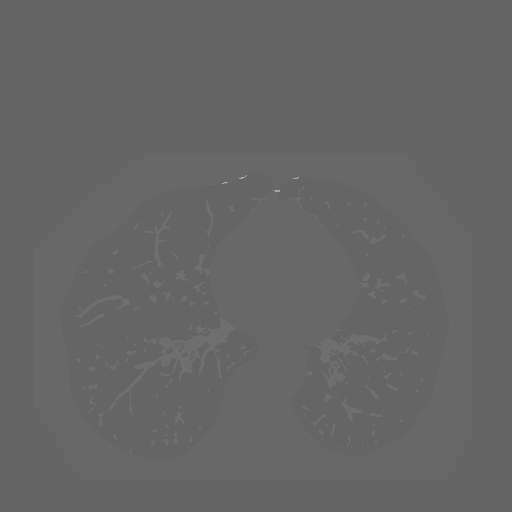
[frame 157/353  lung]
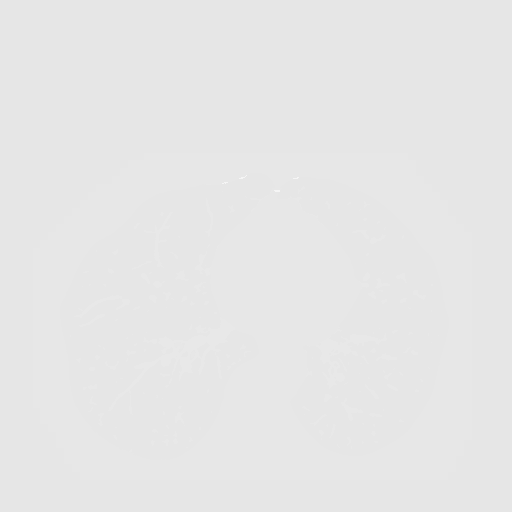
[frame 196/353  lung]
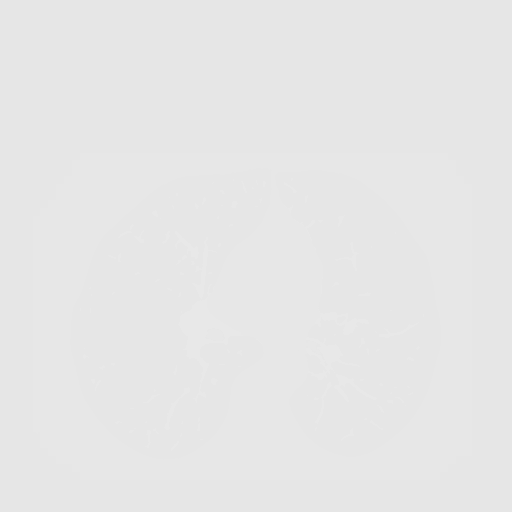
[frame 235/353  lung]
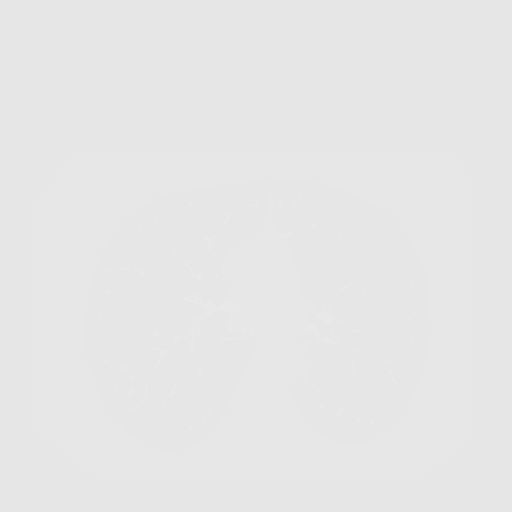
[frame 274/353  lung]
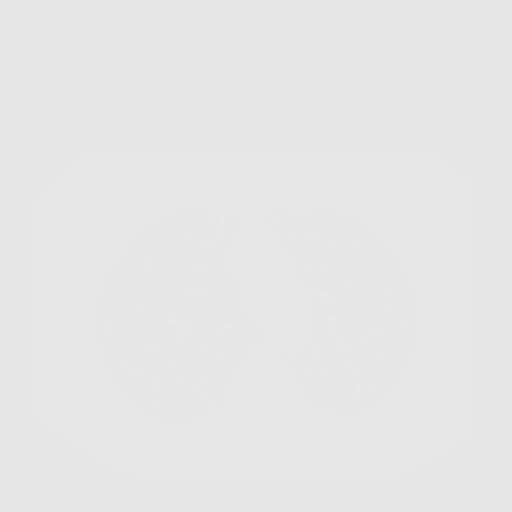
[frame 313/353  mediastinal]
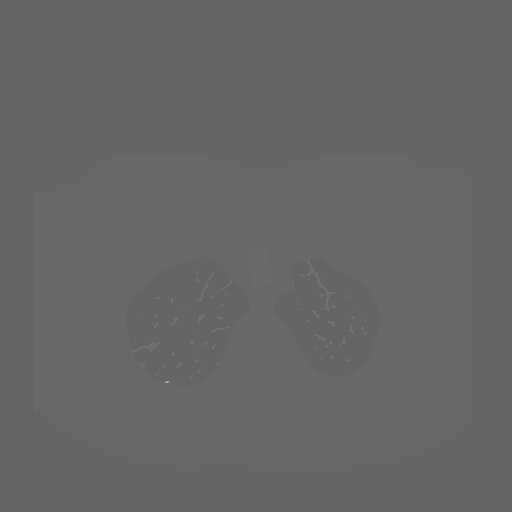
[frame 313/353  lung]
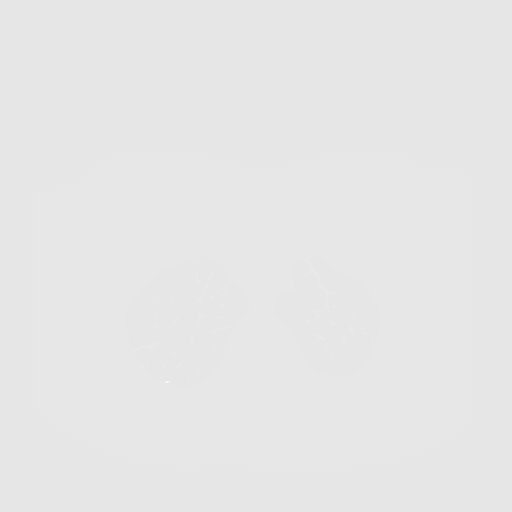
[frame 353/353  lung]
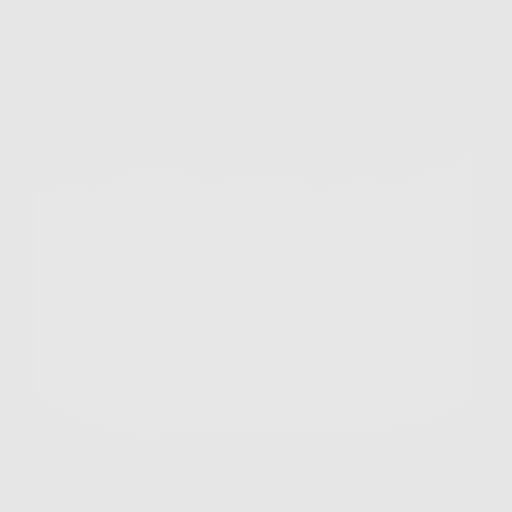

[10 of 10 positions shown; findings below may reference images not displayed]

FINDINGS: Cardiovascular: Heart size is normal. Aortic atherosclerosis
identified. Coronary artery atherosclerotic calcification.

Mediastinum/Nodes: No enlarged mediastinal, hilar, or axillary lymph
nodes. Thyroid gland, trachea, and esophagus demonstrate no
significant findings.

Lungs/Pleura: No pleural effusion, interstitial edema, or airspace
consolidation. Scattered areas of scarring identified bilaterally.
Moderate changes of centrilobular and paraseptal emphysema. There
are 2 solid nodules within the right upper lobe which appear stable
compared with CT from 07/04/2018. The largest nodule has a mean
derived diameter of 6.4 mm.

Upper Abdomen: No acute abnormality.  Status post cholecystectomy.

Musculoskeletal: Spondylosis identified within the thoracic spine.
No acute or suspicious osseous findings.
IMPRESSION: 1. Lung-RADS 2, benign appearance or behavior. Continue annual
screening with low-dose chest CT without contrast in 12 months.
2. Coronary artery calcifications.
3. Aortic Atherosclerosis ([id number]).

## 2023-10-11 ENCOUNTER — Other Ambulatory Visit (HOSPITAL_COMMUNITY): Payer: Self-pay

## 2023-10-12 DIAGNOSIS — M6281 Muscle weakness (generalized): Secondary | ICD-10-CM | POA: Diagnosis not present

## 2023-10-12 DIAGNOSIS — R2681 Unsteadiness on feet: Secondary | ICD-10-CM | POA: Diagnosis not present

## 2023-10-25 DIAGNOSIS — I10 Essential (primary) hypertension: Secondary | ICD-10-CM | POA: Diagnosis not present

## 2023-10-25 DIAGNOSIS — R2681 Unsteadiness on feet: Secondary | ICD-10-CM | POA: Diagnosis not present

## 2023-10-25 DIAGNOSIS — M79642 Pain in left hand: Secondary | ICD-10-CM | POA: Diagnosis not present

## 2023-10-27 ENCOUNTER — Other Ambulatory Visit: Payer: Self-pay

## 2023-10-27 NOTE — Progress Notes (Signed)
 Specialty Pharmacy Refill Coordination Note  April [name] is a 68 y.o. female contacted today regarding refills of specialty medication(s) Mepolizumab  (Nucala )   Patient requested Delivery   Delivery date: 10/29/23   Verified address: [address] [place], [place], [address]   Medication will be filled on 10/28/23.

## 2023-10-28 NOTE — Progress Notes (Signed)
 Assessment/Plan:   1.  Probable PSP             - Patient presented with right foot dystonia, pseudobulbar laughter, pseudobulbar speech, square wave jerks, diplopia, retropulsion and near syncopal episodes.                           -Skin biopsy was negative for alpha-synuclein.             - Patient is working on getting affairs in order and she is going to be moving to [place], [place] .  She had a lot of questions and I answered those to the best of my ability.  Some of those were about the disease itself and prognosis and answered those to the best of my ability.  We discussed walking only with assistance and if somebody was directly with her.  We discussed the possibility of a merry walker early on.  She asked about a motorized wheelchair and stated that someone was helping her get this, and I really do not have an objection to that right now, although told her that they become progressively more difficult to operate as diplopia becomes more problematic.  - We discussed legal assistance for POA, as she is trying to get that changed.  We gave her information to elder law.  - We discussed in detail advanced directives, including end-of-life issues.  We discussed DNR and she was given a copy of the [place]  MOST form.  - We discussed that she may want to trial levodopa  in the future with her next neurologist or physician, although it may not be beneficial.  Nonetheless, it would probably be worth a trial.  - Discussed [facility] organization and she was given information to this, which hopefully can be beneficial where she is moving next.  - She talked about open enrollment for Medicare and we discussed various plans.  - She asked about differences between hospice and palliative care and we discussed those today.   2.  Falls with recent sacral insufficiency fractures  3.  COPD  - Following with pulmonary.   Subjective:   April [name] was seen today in follow up for  atypical parkinsonism. [name], present and supplements the history.   We had anticipated follow-up prior to today, but unfortunately patient ended up in the hospital for a few stays.  She was admitted in July for shortness of breath, felt to be an acute exacerbation of COPD.  She showed back up in the hospital a few weeks later with significant back pain, noting no trauma recently, but has had falls within the month.  She had imaging that diagnosed bilateral sacral insufficiency fractures.  She was discharged August 14 to SNF.  Her friend called here a few days later stated that she was having paranoia associated with PSP.  Her friend was not on the release, but nonetheless I did note that at this stage of the disease, that would likely not be the etiology and another source should be sought out.  She is back in the hospital overnight for weakness and shortness of breath on September 24.  She was found to be hypotensive by the EMS.  She was treated for acute kidney injury and COPD exacerbation and discharged the following day.  I do see that the day that she went in for her pulmonary evaluation she had nonhealing wounds on her leg and the nurse practitioner placed a  wound consult.  I also saw that they tried to call her multiple times without a return call and the referral was closed.  Pts friend [name] states that she cannot get into assisted living so they are moving her up to a place in [place], [place] to a SNF and they will help with spenddown to medicaid. Hoping to move within a month and likely this week.   She is working on [employer].      ALLERGIES:   Allergies  Allergen Reactions   Sulfa Antibiotics Anaphylaxis, Hives and Dermatitis   Sulfonamide Derivatives Anaphylaxis, Swelling, Dermatitis, Rash and Other (See Comments)    Throat closes   Dilaudid  [Hydromorphone  Hcl] Nausea And Vomiting   Fosamax [Alendronate Sodium] Other (See Comments)    GI intolerence   Molds & Smuts Itching and Other (See  Comments)    Sinus and eye irritation   Pollen Extract Itching and Other (See Comments)    Sinus and eye irritation   Wound Dressing Adhesive Other (See Comments)    Redness Skin tears easily  OK to use paper tape    CURRENT MEDICATIONS:  Current Meds  Medication Sig   acetaminophen  (TYLENOL ) 500 MG tablet Take 1,000 mg by mouth 2 (two) times daily as needed for headache or fever (pain).   albuterol  (VENTOLIN  HFA) 108 (90 Base) MCG/ACT inhaler Inhale 2 puffs into the lungs every 6 (six) hours as needed for wheezing or shortness of breath.   azithromycin  (ZITHROMAX ) 250 MG tablet Take 1 tablet (250 mg total) by mouth every Monday, Wednesday, and Friday.   budesonide -glycopyrrolate -formoterol  (BREZTRI  AEROSPHERE) 160-9-4.8 MCG/ACT AERO inhaler Inhale 2 puffs into the lungs every 12 (twelve) hours.   buPROPion  (WELLBUTRIN  XL) 300 MG 24 hr tablet Take 300 mg by mouth at bedtime.   Calcium  Carbonate-Vit D-Min (CALCIUM  1200) 1200-1000 MG-UNIT CHEW 1 tablet Orally Once a day   carbidopa -levodopa  (SINEMET  IR) 25-100 MG tablet TAKE 1 TABLET BY MOUTH 3 TIMES A DAY   Cyanocobalamin  (VITAMIN B 12 PO) Take 1 tablet by mouth at bedtime.   furosemide  (LASIX ) 20 MG tablet Take 20-40 mg by mouth See admin instructions. Take 1 tablet (20mg ) by mouth once daily as needed for excessive swelling. Take 2 tablets (40mg ) once daily for 3 days starting 09/28/23 then resume as needed.   levothyroxine  (SYNTHROID ) 88 MCG tablet Take 88 mcg by mouth daily before breakfast.   Mepolizumab  (NUCALA ) 100 MG/ML SOAJ Inject 1 mL (100 mg total) into the skin every 28 (twenty-eight) days.   methocarbamol  (ROBAXIN ) 750 MG tablet Take 750 mg by mouth 2 (two) times daily as needed for muscle spasms.   metoprolol  succinate (TOPROL -XL) 25 MG 24 hr tablet Take 25 mg by mouth at bedtime.   polyethylene glycol (MIRALAX  / GLYCOLAX ) 17 g packet Take 17 g by mouth daily as needed for moderate constipation.   potassium chloride  (KLOR-CON   M) 10 MEQ tablet Take 1 tablet (10 mEq total) by mouth daily as needed (when you take the lasix ).   rosuvastatin  (CRESTOR ) 10 MG tablet Take 1 tablet (10 mg total) by mouth at bedtime.   sertraline  (ZOLOFT ) 50 MG tablet Take 50 mg by mouth at bedtime.   SYSTANE ULTRA 0.4-0.3 % SOLN Place 1 drop into both eyes 2 (two) times daily.   tiZANidine  (ZANAFLEX ) 4 MG tablet Take 4 mg by mouth daily as needed (severe muscle spasms).   [Paused] valsartan  (DIOVAN ) 40 MG tablet Take 40 mg by mouth at bedtime.  Objective:   PHYSICAL EXAMINATION:    VITALS:   Vitals:   11/01/23 0914  BP: 118/84  Pulse: 92  SpO2: 98%  Weight: 150 lb (68 kg)    GEN:  The patient appears stated age and is in NAD. HEENT:  Normocephalic, atraumatic.  The mucous membranes are moist. The superficial temporal arteries are without ropiness or tenderness. CV:  RRR Lungs:  CTAB Neck/HEME:  There are no carotid bruits bilaterally.  Neurological examination:  Orientation: The patient is alert and oriented x3.  She asks appropriate questions.  She is participatory.  She is not at all confused today. Cranial nerves: There is good facial symmetry with minimal facial hypomimia.  She does have square wave jerks.  She has trouble with smooth pursuit.  The speech is fluent and clear. Soft palate rises symmetrically and there is no tongue deviation. Hearing is intact to conversational tone. Sensation: Sensation is intact to light touch throughout Motor: Strength is at least antigravity x4.  Movement examination: Tone: There is mild increased tone in the left lower extremity. Abnormal movements: none Coordination:  There is  decremation with RAM's, mostly with finger and toe taps on the left Gait and Station: Not tested today.  Patient seen in transport chair today.  I have reviewed and interpreted the following labs independently    Chemistry      Component Value Date/Time   NA 137 09/30/2023 0206   NA 138 01/31/2021  1029   K 4.1 09/30/2023 0206   CL 106 09/30/2023 0206   CO2 23 09/30/2023 0206   BUN 27 (H) 09/30/2023 0206   BUN 15 01/31/2021 1029   CREATININE 1.58 (H) 09/30/2023 0206   CREATININE 1.13 (H) 06/03/2016 1213      Component Value Date/Time   CALCIUM  8.6 (L) 09/30/2023 0206   ALKPHOS 131 (H) 09/29/2023 1304   AST 21 09/29/2023 1304   ALT 16 09/29/2023 1304   BILITOT 0.9 09/29/2023 1304   BILITOT 0.4 05/20/2021 1154       Lab Results  Component Value Date   WBC 4.0 09/30/2023   HGB 9.9 (L) 09/30/2023   HCT 31.3 (L) 09/30/2023   MCV 96.0 09/30/2023   PLT 175 09/30/2023    Lab Results  Component Value Date   TSH 2.111 07/24/2023     Total time spent on today's visit was 75 minutes, including both face-to-face time and nonface-to-face time.  Time included that spent on review of records (prior notes available to me/labs/imaging if pertinent), discussing treatment and goals, answering patient's questions and coordinating care.  Cc:  Tonnie Jon SAILOR, NP

## 2023-11-01 ENCOUNTER — Encounter: Payer: Self-pay | Admitting: Neurology

## 2023-11-01 ENCOUNTER — Ambulatory Visit: Admitting: Neurology

## 2023-11-01 VITALS — BP 118/84 | HR 92 | Wt 150.0 lb

## 2023-11-01 DIAGNOSIS — G231 Progressive supranuclear ophthalmoplegia [Steele-Richardson-Olszewski]: Secondary | ICD-10-CM | POA: Diagnosis not present

## 2023-11-01 NOTE — Patient Instructions (Addendum)
 We discussed your need for: Modified barium swallow test Plan for only walking with assistance - discussed possible merry walker earlier on and then motorized wheelchair (if no double vision) Legal assistance and we discussed Elder Law Advanced Directives (discussed DNR and [place] MOST form) Probable trial of levodopa  in the future with your next neurologist or physician Contacting [facility]P

## 2023-11-03 ENCOUNTER — Encounter (HOSPITAL_BASED_OUTPATIENT_CLINIC_OR_DEPARTMENT_OTHER): Attending: General Surgery | Admitting: General Surgery

## 2023-11-03 DIAGNOSIS — L98499 Non-pressure chronic ulcer of skin of other sites with unspecified severity: Secondary | ICD-10-CM | POA: Insufficient documentation

## 2023-11-03 DIAGNOSIS — I5032 Chronic diastolic (congestive) heart failure: Secondary | ICD-10-CM | POA: Diagnosis not present

## 2023-11-03 DIAGNOSIS — L97819 Non-pressure chronic ulcer of other part of right lower leg with unspecified severity: Secondary | ICD-10-CM | POA: Insufficient documentation

## 2023-11-03 DIAGNOSIS — R296 Repeated falls: Secondary | ICD-10-CM | POA: Diagnosis not present

## 2023-11-03 DIAGNOSIS — R600 Localized edema: Secondary | ICD-10-CM | POA: Insufficient documentation

## 2023-11-05 ENCOUNTER — Telehealth (HOSPITAL_COMMUNITY): Payer: Self-pay

## 2023-11-05 NOTE — Telephone Encounter (Signed)
 Received referral from Dr. Neda for this pt to participate in Pulmonary Rehab with the diagnosis of COPD 3. Clinical review of pt follow up appt on 10/06/23 from pulmonology and 10/27 neurology note. Will call pt to discuss current activity level and fall precautions with pt. Per neurology note pt needs to be a 1:1 at all times during ambulation d/t frequent falls with injury. Awaiting call back from pt to discuss interest and options.   Ronal Levin Card[facility] and Pul[facility] Rehab

## 2023-11-05 NOTE — Telephone Encounter (Signed)
 LVM for pt to discuss the possibility of scheduling for the [facility] Rehab program

## 2023-11-10 ENCOUNTER — Telehealth (HOSPITAL_COMMUNITY): Payer: Self-pay

## 2023-11-16 ENCOUNTER — Other Ambulatory Visit: Payer: Self-pay

## 2023-11-16 ENCOUNTER — Emergency Department (HOSPITAL_COMMUNITY)

## 2023-11-16 ENCOUNTER — Emergency Department (HOSPITAL_COMMUNITY)
Admission: EM | Admit: 2023-11-16 | Discharge: 2023-11-18 | Disposition: A | Discharge status: Home / Self Care | Attending: Emergency Medicine | Admitting: Emergency Medicine

## 2023-11-16 ENCOUNTER — Encounter (HOSPITAL_COMMUNITY): Payer: Self-pay

## 2023-11-16 DIAGNOSIS — Z7951 Long term (current) use of inhaled steroids: Secondary | ICD-10-CM | POA: Diagnosis not present

## 2023-11-16 DIAGNOSIS — G20A1 Parkinson's disease without dyskinesia, without mention of fluctuations: Secondary | ICD-10-CM | POA: Insufficient documentation

## 2023-11-16 DIAGNOSIS — W19XXXA Unspecified fall, initial encounter: Secondary | ICD-10-CM

## 2023-11-16 DIAGNOSIS — S52501A Unspecified fracture of the lower end of right radius, initial encounter for closed fracture: Secondary | ICD-10-CM | POA: Insufficient documentation

## 2023-11-16 DIAGNOSIS — M79601 Pain in right arm: Secondary | ICD-10-CM | POA: Diagnosis present

## 2023-11-16 DIAGNOSIS — J449 Chronic obstructive pulmonary disease, unspecified: Secondary | ICD-10-CM | POA: Diagnosis not present

## 2023-11-16 LAB — CBC WITH DIFFERENTIAL/PLATELET
Abs Immature Granulocytes: 0.04 K/uL (ref 0.00–0.07)
Basophils Absolute: 0.1 K/uL (ref 0.0–0.1)
Basophils Relative: 1 %
Eosinophils Absolute: 0 K/uL (ref 0.0–0.5)
Eosinophils Relative: 0 %
HCT: 37 % (ref 36.0–46.0)
Hemoglobin: 11.7 g/dL — ABNORMAL LOW (ref 12.0–15.0)
Immature Granulocytes: 0 %
Lymphocytes Relative: 11 %
Lymphs Abs: 1 K/uL (ref 0.7–4.0)
MCH: 30.7 pg (ref 26.0–34.0)
MCHC: 31.6 g/dL (ref 30.0–36.0)
MCV: 97.1 fL (ref 80.0–100.0)
Monocytes Absolute: 0.7 K/uL (ref 0.1–1.0)
Monocytes Relative: 8 %
Neutro Abs: 7.4 K/uL (ref 1.7–7.7)
Neutrophils Relative %: 80 %
Platelets: 215 K/uL (ref 150–400)
RBC: 3.81 MIL/uL — ABNORMAL LOW (ref 3.87–5.11)
RDW: 14.5 % (ref 11.5–15.5)
WBC: 9.2 K/uL (ref 4.0–10.5)
nRBC: 0 % (ref 0.0–0.2)

## 2023-11-16 LAB — BASIC METABOLIC PANEL WITH GFR
Anion gap: 9 (ref 5–15)
BUN: 23 mg/dL (ref 8–23)
CO2: 27 mmol/L (ref 22–32)
Calcium: 9.3 mg/dL (ref 8.9–10.3)
Chloride: 102 mmol/L (ref 98–111)
Creatinine, Ser: 1.32 mg/dL — ABNORMAL HIGH (ref 0.44–1.00)
GFR, Estimated: 43 mL/min — ABNORMAL LOW (ref >60)
Glucose, Bld: 126 mg/dL — ABNORMAL HIGH (ref 70–99)
Potassium: 4.6 mmol/L (ref 3.5–5.1)
Sodium: 139 mmol/L (ref 135–145)

## 2023-11-16 MED ORDER — OXYCODONE HCL 5 MG PO TABS
5.0000 mg | ORAL_TABLET | Freq: Once | ORAL | Status: AC
  Administered 2023-11-16: 5 mg via ORAL
  Filled 2023-11-16: qty 1

## 2023-11-16 MED ORDER — FUROSEMIDE 40 MG PO TABS: 20.0000 mg | ORAL_TABLET | ORAL | Status: DC

## 2023-11-16 MED ORDER — ACETAMINOPHEN 500 MG PO TABS
1000.0000 mg | ORAL_TABLET | Freq: Two times a day (BID) | ORAL | Status: DC | PRN
  Administered 2023-11-17 (×4): 1000 mg via ORAL
  Filled 2023-11-16 (×4): qty 2

## 2023-11-16 MED ORDER — METOPROLOL SUCCINATE ER 50 MG PO TB24
25.0000 mg | ORAL_TABLET | Freq: Every day | ORAL | Status: DC
  Administered 2023-11-16 – 2023-11-17 (×2): 25 mg via ORAL
  Filled 2023-11-16 (×2): qty 1

## 2023-11-16 MED ORDER — BUPROPION HCL ER (XL) 150 MG PO TB24
300.0000 mg | ORAL_TABLET | Freq: Every day | ORAL | Status: DC
  Administered 2023-11-16 – 2023-11-17 (×2): 300 mg via ORAL
  Filled 2023-11-16 (×3): qty 2

## 2023-11-16 MED ORDER — IPRATROPIUM-ALBUTEROL 0.5-2.5 (3) MG/3ML IN SOLN: 3.0000 mL | Freq: Once | RESPIRATORY_TRACT | Status: DC

## 2023-11-16 MED ORDER — ACETAMINOPHEN 500 MG PO TABS
1000.0000 mg | ORAL_TABLET | Freq: Once | ORAL | Status: AC
  Administered 2023-11-16: 1000 mg via ORAL
  Filled 2023-11-16: qty 2

## 2023-11-16 MED ORDER — CARBIDOPA-LEVODOPA 25-100 MG PO TABS
1.0000 | ORAL_TABLET | Freq: Three times a day (TID) | ORAL | Status: DC
  Administered 2023-11-16 – 2023-11-18 (×5): 1 via ORAL
  Filled 2023-11-16 (×5): qty 1

## 2023-11-16 MED ORDER — ALBUTEROL SULFATE HFA 108 (90 BASE) MCG/ACT IN AERS
2.0000 | INHALATION_SPRAY | Freq: Four times a day (QID) | RESPIRATORY_TRACT | Status: DC
  Administered 2023-11-16 – 2023-11-18 (×7): 2 via RESPIRATORY_TRACT
  Filled 2023-11-16 (×2): qty 6.7

## 2023-11-16 MED ORDER — METHOCARBAMOL 500 MG PO TABS
750.0000 mg | ORAL_TABLET | Freq: Two times a day (BID) | ORAL | Status: DC | PRN
  Administered 2023-11-17 (×3): 750 mg via ORAL
  Filled 2023-11-16 (×3): qty 2

## 2023-11-16 MED ORDER — LEVOTHYROXINE SODIUM 88 MCG PO TABS
88.0000 ug | ORAL_TABLET | Freq: Every day | ORAL | Status: DC
  Administered 2023-11-17 – 2023-11-18 (×2): 88 ug via ORAL
  Filled 2023-11-16 (×3): qty 1

## 2023-11-16 MED ORDER — OXYCODONE HCL 5 MG PO TABS
5.0000 mg | ORAL_TABLET | Freq: Four times a day (QID) | ORAL | Status: AC | PRN
  Administered 2023-11-17 – 2023-11-18 (×5): 5 mg via ORAL
  Filled 2023-11-16 (×5): qty 1

## 2023-11-16 MED ORDER — BUDESON-GLYCOPYRROL-FORMOTEROL 160-9-4.8 MCG/ACT IN AERO
2.0000 | INHALATION_SPRAY | Freq: Two times a day (BID) | RESPIRATORY_TRACT | Status: DC
  Administered 2023-11-17 – 2023-11-18 (×4): 2 via RESPIRATORY_TRACT
  Filled 2023-11-16: qty 5.9

## 2023-11-16 NOTE — ED Provider Notes (Signed)
 Provider Suicide Risk Assessment Note  Nursing Documentation C-SSRS RISK CATEGORY: No Risk     Suicide Risk Assessment:  Based on my clinical evaluation, I estimate the patient to be at chronic low risk of self-harm in the current setting. This decision is based on my review of the chart including patient's history and current presentation, interview of the patient, mental status examination, and consideration of suicide risk including evaluating suicidal ideation, plan, intent, suicidal or self-harm behaviors, risk factors, and protective factors.  Patient has following modifiable risk factors for suicide: pain, medical illness (ie new dx of cancer) Patient denies any SI to me.  No indication of depression or thoughts of self-harm.     Mannie Pac T, DO 11/16/23 2228

## 2023-11-16 NOTE — NC FL2 (Signed)
 [place]  MEDICAID FL2 LEVEL OF CARE FORM     IDENTIFICATION  Patient Name: April [name] Birthdate: 30-Aug-1955 Sex: female Admission Date (Current Location): 11/16/2023  [facility] and [place] Number:  Producer, Television/film/video and Address:  [facility],  [address] [place], [place] [address]      Provider Number: [id number]  Attending Physician Name and Address:  Mannie Fairy DASEN, DO  Relative Name and Phone Number:  Milligan(POA),Jasper (Son)  [phone] [facility])    Current Level of Care: Hospital Recommended Level of Care: Skilled Nursing Facility Prior Approval Number:    Date Approved/Denied:   PASRR Number: [id number]  Discharge Plan: SNF    Current Diagnoses: Patient Active Problem List   Diagnosis Date Noted   COPD exacerbation (HCC) 10/01/2023   AKI (acute kidney injury) 09/30/2023   Non-healing wound of lower extremity 09/30/2023   COPD with acute exacerbation (HCC) 09/29/2023   Pelvic fracture (HCC) 08/17/2023   Acute exacerbation of COPD with asthma (HCC) 07/24/2023   Acute exacerbation of chronic obstructive pulmonary disease (COPD) (HCC) 07/23/2023   Atrophy of skin due to systemic corticosteroid 04/08/2023   Parkinson's disease with dyskinesia and fluctuating manifestations (HCC) 04/08/2023   Bilateral lower extremity edema 05/27/2022   Frequent falls 03/05/2022   Serrated polyp of colon 03/05/2022   Mixed hyperlipidemia 12/03/2021   Unsteady gait 07/20/2021   Chronic diastolic CHF (congestive heart failure) (HCC) 06/13/2021   Upper airway cough syndrome 05/07/2021   Osteoporosis 03/23/2021   Allergic rhinitis 04/06/2019   Aortic atherosclerosis 12/17/2017   Pulmonary nodule 12/17/2017   GERD (gastroesophageal reflux disease) 12/12/2017   COPD, severe (HCC) 11/24/2017   CKD (chronic kidney disease), stage III (HCC) 10/28/2017   Vertigo 10/27/2017   Essential hypertension 06/23/2017   Polymyalgia rheumatica 05/22/2016   History of  bilateral hip replacements 05/22/2016   DDD (degenerative disc disease), lumbar 05/22/2016   History of avascular necrosis of capital femoral epiphysis 08/14/2014   Asthma 03/23/2010   Acquired hypothyroidism 03/21/2010   Chronic recurrent major depressive disorder 03/21/2010   Former smoker 03/21/2010   History of temporal arteritis 03/21/2010    Orientation RESPIRATION BLADDER Height & Weight     Self, Time, Place, Situation  O2 (2L) Continent Weight: 149 lb 14.6 oz (68 kg) Height:  5' 6 (167.6 cm)  BEHAVIORAL SYMPTOMS/MOOD NEUROLOGICAL BOWEL NUTRITION STATUS      Continent Diet (see dc summary)  AMBULATORY STATUS COMMUNICATION OF NEEDS Skin   Limited Assist Verbally Normal                       Personal Care Assistance Level of Assistance  Bathing, Feeding, Dressing Bathing Assistance: Limited assistance Feeding assistance: Limited assistance Dressing Assistance: Limited assistance     Functional Limitations Info  Sight, Speech, Hearing Sight Info: Adequate Hearing Info: Adequate Speech Info: Adequate    SPECIAL CARE FACTORS FREQUENCY  PT (By licensed PT), OT (By licensed OT)     PT Frequency: 5x/wk OT Frequency: 5x/wk            Contractures Contractures Info: Not present    Additional Factors Info  Code Status, Allergies Code Status Info: Full code Allergies Info: Sulfa Antibiotics  Sulfonamide Derivatives  Dilaudid  (Hydromorphone  Hcl)  Fosamax (Alendronate Sodium)  Molds & Smuts  Pollen Extract  Wound Dressing Adhesive           Current Medications (11/16/2023):  This is the current hospital active medication list No current  facility-administered medications for this encounter.   Current Outpatient Medications  Medication Sig Dispense Refill   acetaminophen  (TYLENOL ) 500 MG tablet Take 1,000 mg by mouth 2 (two) times daily as needed for headache or fever (pain).     albuterol  (VENTOLIN  HFA) 108 (90 Base) MCG/ACT inhaler Inhale 2 puffs into the  lungs every 6 (six) hours as needed for wheezing or shortness of breath. 18 g 3   azithromycin  (ZITHROMAX ) 250 MG tablet Take 1 tablet (250 mg total) by mouth every Monday, Wednesday, and Friday. 36 tablet 3   budesonide -glycopyrrolate -formoterol  (BREZTRI  AEROSPHERE) 160-9-4.8 MCG/ACT AERO inhaler Inhale 2 puffs into the lungs every 12 (twelve) hours. 32.1 g 3   buPROPion  (WELLBUTRIN  XL) 300 MG 24 hr tablet Take 300 mg by mouth at bedtime.     Calcium  Carbonate-Vit D-Min (CALCIUM  1200) 1200-1000 MG-UNIT CHEW 1 tablet Orally Once a day     carbidopa -levodopa  (SINEMET  IR) 25-100 MG tablet TAKE 1 TABLET BY MOUTH 3 TIMES A DAY 270 tablet 1   Cyanocobalamin  (VITAMIN B 12 PO) Take 1 tablet by mouth at bedtime.     furosemide  (LASIX ) 20 MG tablet Take 20-40 mg by mouth See admin instructions. Take 1 tablet (20mg ) by mouth once daily as needed for excessive swelling. Take 2 tablets (40mg ) once daily for 3 days starting 09/28/23 then resume as needed.     levothyroxine  (SYNTHROID ) 88 MCG tablet Take 88 mcg by mouth daily before breakfast.     Mepolizumab  (NUCALA ) 100 MG/ML SOAJ Inject 1 mL (100 mg total) into the skin every 28 (twenty-eight) days. 1 mL 3   methocarbamol  (ROBAXIN ) 750 MG tablet Take 750 mg by mouth 2 (two) times daily as needed for muscle spasms.     metoprolol  succinate (TOPROL -XL) 25 MG 24 hr tablet Take 25 mg by mouth at bedtime.     polyethylene glycol (MIRALAX  / GLYCOLAX ) 17 g packet Take 17 g by mouth daily as needed for moderate constipation.     potassium chloride  (KLOR-CON  M) 10 MEQ tablet Take 1 tablet (10 mEq total) by mouth daily as needed (when you take the lasix ). 30 tablet 3   rosuvastatin  (CRESTOR ) 10 MG tablet Take 1 tablet (10 mg total) by mouth at bedtime. 90 tablet 3   sertraline  (ZOLOFT ) 50 MG tablet Take 50 mg by mouth at bedtime.     SYSTANE ULTRA 0.4-0.3 % SOLN Place 1 drop into both eyes 2 (two) times daily. 30 mL 0   tiZANidine  (ZANAFLEX ) 4 MG tablet Take 4 mg by  mouth daily as needed (severe muscle spasms).     [Paused] valsartan  (DIOVAN ) 40 MG tablet Take 40 mg by mouth at bedtime.       Discharge Medications: Please see discharge summary for a list of discharge medications.  Relevant Imaging Results:  Relevant Lab Results:   Additional Information SSN [id number]  Sheri ONEIDA Sharps, LCSW

## 2023-11-16 NOTE — ED Notes (Signed)
 Patient is resting comfortably.

## 2023-11-16 NOTE — Discharge Instructions (Addendum)
 Follow-up with the hand surgeon.  Take the pain medicines as needed.  Home health will be arranged.

## 2023-11-16 NOTE — Progress Notes (Signed)
 Orthopedic Tech Progress Note Patient Details:  April [name] 06-17-1955 [id number]  Ortho Devices Type of Ortho Device: Long arm splint Ortho Device/Splint Location: RUE Ortho Device/Splint Interventions: Ordered, Application, Adjustment   Post Interventions Patient Tolerated: Fair Instructions Provided: Adjustment of device, Other (comment) Talked with DO, agreed with DO to apply sugar tong splint.   [name] 11/16/2023, 9:24 PM

## 2023-11-16 NOTE — ED Notes (Signed)
 Notified Dr. Mannie via secure chat that patient requesting home dose of albuterol  inhaler. Currently on PRN dose of supplemental oxygen  via nasal cannula @ 2l/min via [place]

## 2023-11-16 NOTE — ED Triage Notes (Signed)
 Pt BIB EMS from [facility] with reports of a fall. Pt has had recent balance issues. Pt stood up from her wheelchair and fell backwards hitting the top of her head. Pt has skin tears to both arms. Right wrist pain and swelling. C Collar in place, no loc, no blood thinners.   20 l ac 50 mcg fentanyl 

## 2023-11-16 NOTE — ED Notes (Signed)
 Notified Ortho tech of patient's pending order for short arm splint to fractured right wrist.  Showed xray results to him as well for clarification

## 2023-11-16 NOTE — ED Notes (Signed)
C-collar removed by EDP.

## 2023-11-16 NOTE — ED Provider Notes (Signed)
 [place] EMERGENCY DEPARTMENT AT [facility] Provider Note   CSN: [id number] Arrival date & time: 11/16/23  1744     Patient presents with: April [name] April [name] is a 68 y.o. female.   This is a very pleasant 68 year old female with a history of Parkinson's disease, COPD who uses 2 L of oxygen  as needed here today after she had a nonsyncopal fall from standing.  Patient uses a wheelchair most of the time, however sometimes she gets up and moves around on her 2 feet.  She stood up from her wheelchair, fell down forward and landed on her outstretched right arm.  She denies any head strike, not on blood thinners, no loss of conscious.  No pain in her hips or back.   Fall       Prior to Admission medications   Medication Sig Start Date End Date Taking? Authorizing Provider  acetaminophen  (TYLENOL ) 500 MG tablet Take 1,000 mg by mouth 2 (two) times daily as needed for headache or fever (pain).    [provider]  albuterol  (VENTOLIN  HFA) 108 (90 Base) MCG/ACT inhaler Inhale 2 puffs into the lungs every 6 (six) hours as needed for wheezing or shortness of breath. 10/06/23   [name], NP  azithromycin  (ZITHROMAX ) 250 MG tablet Take 1 tablet (250 mg total) by mouth every Monday, Wednesday, and Friday. 10/06/23 09/06/24  [name], NP  budesonide -glycopyrrolate -formoterol  (BREZTRI  AEROSPHERE) 160-9-4.8 MCG/ACT AERO inhaler Inhale 2 puffs into the lungs every 12 (twelve) hours. 10/06/23   [name], NP  buPROPion  (WELLBUTRIN  XL) 300 MG 24 hr tablet Take 300 mg by mouth at bedtime.    [provider]  Calcium  Carbonate-Vit D-Min (CALCIUM  1200) 1200-1000 MG-UNIT CHEW 1 tablet Orally Once a day    [provider]  carbidopa -levodopa  (SINEMET  IR) 25-100 MG tablet TAKE 1 TABLET BY MOUTH 3 TIMES A DAY 08/03/23   Tat, Asberry RAMAN, DO  Cyanocobalamin  (VITAMIN B 12 PO) Take 1 tablet by mouth at bedtime.    [provider]   furosemide  (LASIX ) 20 MG tablet Take 20-40 mg by mouth See admin instructions. Take 1 tablet (20mg ) by mouth once daily as needed for excessive swelling. Take 2 tablets (40mg ) once daily for 3 days starting 09/28/23 then resume as needed.    [provider]  levothyroxine  (SYNTHROID ) 88 MCG tablet Take 88 mcg by mouth daily before breakfast.    [provider]  Mepolizumab  (NUCALA ) 100 MG/ML SOAJ Inject 1 mL (100 mg total) into the skin every 28 (twenty-eight) days. 10/06/23   Jude Harden GAILS, MD  methocarbamol  (ROBAXIN ) 750 MG tablet Take 750 mg by mouth 2 (two) times daily as needed for muscle spasms.    [provider]  metoprolol  succinate (TOPROL -XL) 25 MG 24 hr tablet Take 25 mg by mouth at bedtime.    [provider]  polyethylene glycol (MIRALAX  / GLYCOLAX ) 17 g packet Take 17 g by mouth daily as needed for moderate constipation. 09/30/23   [name], [name], DO  potassium chloride  (KLOR-CON  M) 10 MEQ tablet Take 1 tablet (10 mEq total) by mouth daily as needed (when you take the lasix ). 03/01/23   [name], MD  rosuvastatin  (CRESTOR ) 10 MG tablet Take 1 tablet (10 mg total) by mouth at bedtime. 03/01/23   [name], MD  sertraline  (ZOLOFT ) 50 MG tablet Take 50 mg by mouth at bedtime. 07/08/23   [provider]  SYSTANE ULTRA 0.4-0.3 %  SOLN Place 1 drop into both eyes 2 (two) times daily. 09/30/23   [name], [name], DO  tiZANidine  (ZANAFLEX ) 4 MG tablet Take 4 mg by mouth daily as needed (severe muscle spasms).    [provider]  valsartan  (DIOVAN ) 40 MG tablet Take 40 mg by mouth at bedtime.    [provider]    Allergies: Sulfa antibiotics, Sulfonamide derivatives, Dilaudid  [hydromorphone  hcl], Fosamax [alendronate sodium], Molds & smuts, Pollen extract, and Wound dressing adhesive    Review of Systems  Updated Vital Signs BP 115/67 (BP Location: Left Arm)   Pulse 87   Temp 97.7 F (36.5 C) (Oral)   Resp  18   Ht 5' 6 (1.676 m)   Wt 68 kg   SpO2 100%   BMI 24.20 kg/m   Physical Exam Vitals and nursing note reviewed.  Constitutional:      General: She is not in acute distress.    Appearance: Normal appearance. She is not toxic-appearing.  HENT:     Head: Normocephalic and atraumatic.  Cardiovascular:     Rate and Rhythm: Normal rate.  Pulmonary:     Effort: Pulmonary effort is normal.     Breath sounds: Normal breath sounds.  Abdominal:     General: Abdomen is flat. There is no distension.     Palpations: Abdomen is soft.     Tenderness: There is no abdominal tenderness.  Musculoskeletal:     Cervical back: Normal range of motion.     Comments: Mild deformity in the right distal radius.  No tenting of the skin.  Skin tear present on the right forearm, left forearm.  No significant tenderness beneath.  Stable pelvis, no tenderness.  No tenderness of the chest or shoulders.  No pain or tenderness in the cervical, thoracic or lumbar spine.  Neurological:     General: No focal deficit present.     Mental Status: She is alert.     Cranial Nerves: No cranial nerve deficit.     Sensory: No sensory deficit.     Motor: No weakness.     Comments: 5 out of 5 straight leg raise bilaterally     (all labs ordered are listed, but only abnormal results are displayed) Labs Reviewed  BASIC METABOLIC PANEL WITH GFR - Abnormal; Notable for the following components:      Result Value   Glucose, Bld 126 (*)    Creatinine, Ser 1.32 (*)    GFR, Estimated 43 (*)    All other components within normal limits  CBC WITH DIFFERENTIAL/PLATELET - Abnormal; Notable for the following components:   RBC 3.81 (*)    Hemoglobin 11.7 (*)    All other components within normal limits    EKG: None  Radiology: CT Cervical Spine Wo Contrast Result Date: 11/16/2023 EXAM: CT CERVICAL SPINE WITHOUT CONTRAST 11/16/2023 07:28:43 PM TECHNIQUE: CT of the cervical spine was performed without the  administration of intravenous contrast. Multiplanar reformatted images are provided for review. Automated exposure control, iterative reconstruction, and/or weight based adjustment of the mA/kV was utilized to reduce the radiation dose to as low as reasonably achievable. COMPARISON: 07/21/2023 CLINICAL HISTORY: Recent fall FINDINGS: CERVICAL SPINE: BONES AND ALIGNMENT: 7 cervical segments are well visualized. Vertebral body height is well maintained. No acute fracture or acute facet abnormality is seen. The odontoid is within normal limits. No traumatic malalignment. DEGENERATIVE CHANGES: Mild osteophytic changes are seen at C5-C6 and C6-C7. Facet hypertrophic changes are noted. SOFT TISSUES: Visualized lung  apices demonstrate mild emphysematous change. Surrounding soft tissue structures are within normal limits. IMPRESSION: 1. No acute abnormality of the cervical spine related to the recent fall. 2. Mild cervical osteoarthritic changes at C5-C6 and C6-C7. 3. Incidental mild pulmonary emphysema; for patients aged 70, consider evaluation for a low-dose CT lung cancer screening program given emphysema as an independent risk factor for lung cancer. Electronically signed by: Oneil Devonshire MD 11/16/2023 07:33 PM EST RP Workstation: [id number]   CT Head Wo Contrast Result Date: 11/16/2023 EXAM: CT HEAD WITHOUT CONTRAST 11/16/2023 07:28:43 PM TECHNIQUE: CT of the head was performed without the administration of intravenous contrast. Automated exposure control, iterative reconstruction, and/or weight based adjustment of the mA/kV was utilized to reduce the radiation dose to as low as reasonably achievable. COMPARISON: Comparison study 07/21/2023. CLINICAL HISTORY: Recent fall. FINDINGS: BRAIN AND VENTRICLES: Chronic atrophic changes are noted. No acute hemorrhage. No evidence of acute infarct. No extra-axial collection. No mass effect or midline shift. ORBITS: No acute abnormality. SINUSES: No acute abnormality. SOFT  TISSUES AND SKULL: No acute soft tissue abnormality. No skull fracture. IMPRESSION: 1. No acute intracranial abnormality. 2. Chronic atrophic changes. Electronically signed by: Oneil Devonshire MD 11/16/2023 07:31 PM EST RP Workstation: [id number]   DG Pelvis Portable Result Date: 11/16/2023 EXAM: 1 or 2 VIEW(S) XRAY OF THE PELVIS 11/16/2023 07:06:00 PM COMPARISON: 07/21/2023 CLINICAL HISTORY: pain pain FINDINGS: BONES AND JOINTS: Prior bilateral hip replacements. No hardware complicating feature. No acute fracture. No focal osseous lesion. No joint dislocation. SOFT TISSUES: The soft tissues are unremarkable. IMPRESSION: 1. No acute findings. 2. Prior bilateral hip replacements without hardware complication. Electronically signed by: Franky Crease MD 11/16/2023 07:09 PM EST RP Workstation: [id number]   DG Wrist Complete Right Result Date: 11/16/2023 EXAM: 3 OR MORE VIEW(S) XRAY OF THE RIGHT WRIST 11/16/2023 07:04:00 PM COMPARISON: None available. CLINICAL HISTORY: pain FINDINGS: BONES AND JOINTS: Distal right radial fracture with impaction and mild posterior angulation. Mildly displaced ulnar styloid fracture. Mild degenerative changes at the first carpometacarpal joint. No focal osseous lesion. No joint dislocation. SOFT TISSUES: The soft tissues are unremarkable. IMPRESSION: 1. Distal right radial fracture with impaction and mild posterior angulation. 2. Mildly displaced ulnar styloid fracture. Electronically signed by: Franky Crease MD 11/16/2023 07:08 PM EST RP Workstation: [id number]     Procedures   Medications Ordered in the ED  albuterol  (VENTOLIN  HFA) 108 (90 Base) MCG/ACT inhaler 2 puff (has no administration in time range)  acetaminophen  (TYLENOL ) tablet 1,000 mg (has no administration in time range)  budesonide -glycopyrrolate -formoterol  (BREZTRI ) 160-9-4.8 MCG/ACT inhaler 2 puff (has no administration in time range)  buPROPion  (WELLBUTRIN  XL) 24 hr tablet 300 mg (has no administration in time  range)  carbidopa -levodopa  (SINEMET  IR) 25-100 MG per tablet immediate release 1 tablet (has no administration in time range)  furosemide  (LASIX ) tablet 20-40 mg (has no administration in time range)  levothyroxine  (SYNTHROID ) tablet 88 mcg (has no administration in time range)  methocarbamol  (ROBAXIN ) tablet 750 mg (has no administration in time range)  metoprolol  succinate (TOPROL -XL) 24 hr tablet 25 mg (has no administration in time range)  oxyCODONE  (Oxy IR/ROXICODONE ) immediate release tablet 5 mg (has no administration in time range)  acetaminophen  (TYLENOL ) tablet 1,000 mg (1,000 mg Oral Given 11/16/23 1911)  oxyCODONE  (Oxy IR/ROXICODONE ) immediate release tablet 5 mg (5 mg Oral Given 11/16/23 2002)  Medical Decision Making 68 year old female who is here today with a fall.  Differential diagnoses include distal radius fracture, intracranial hemorrhage, gait instability.  Plan -patient with a distal radius fracture on the right.  Have placed a sugar-tong splint in the patient's distal radius.  Patient with no neurological deficits in the right hand, good pulses.  Compartments soft.  Patient with some minor skin tears which have been dressed.  Her CT imaging of her head and neck are negative.  Obtain blood work on the patient which showed stable hemoglobin, stable creatinine.  Patient lives in independent living.  They do have concerns about her ability to take care of herself at home with her new fracture.  I placed patient in border status.  All home medications ordered.  I have placed orthopedic referral as well.  Amount and/or Complexity of Data Reviewed Labs: ordered. Radiology: ordered.  Risk OTC drugs. Prescription drug management.        Final diagnoses:  Fall, initial encounter  Closed fracture of distal end of right radius, unspecified fracture morphology, initial encounter    ED Discharge Orders     None           Mannie Fairy DASEN, DO 11/16/23 2227

## 2023-11-16 NOTE — Progress Notes (Signed)
 PASRR obtained and FL2 completed. SNF ref faxed; awaiting bed offers.

## 2023-11-17 ENCOUNTER — Encounter

## 2023-11-17 ENCOUNTER — Emergency Department (HOSPITAL_COMMUNITY)

## 2023-11-17 MED ORDER — HEPARIN SODIUM (PORCINE) 5000 UNIT/ML IJ SOLN
5000.0000 [IU] | Freq: Three times a day (TID) | INTRAMUSCULAR | Status: DC
  Administered 2023-11-17 – 2023-11-18 (×3): 5000 [IU] via SUBCUTANEOUS
  Filled 2023-11-17 (×3): qty 1

## 2023-11-17 MED ORDER — IPRATROPIUM-ALBUTEROL 0.5-2.5 (3) MG/3ML IN SOLN: 3.0000 mL | Freq: Four times a day (QID) | RESPIRATORY_TRACT | Status: DC | PRN

## 2023-11-17 NOTE — Evaluation (Signed)
 Physical Therapy Evaluation Patient Details Name: April [name] MRN: [id number] DOB: Aug 01, 1955 Today's Date: 11/17/2023  History of Present Illness  Patient is 68 YO presents to [facility] 11/16/23 after a fall from a WC when standing up. sustained R distal  radius fracture, placed in splint. Patient resides in ILF. PMH: CHF,COPD,HTN,asthma, hyperlipidemia, and RTSA.  Clinical Impression         Pt admitted with above diagnosis.  Pt currently with functional limitations due to the deficits listed below (see PT Problem List). Pt will benefit from acute skilled PT to increase their independence and safety with mobility to allow discharge.    The patient is seated in recliner. Patient assisted with transfers to bed and back into recliner with min assistance. Patient reports that she has HHPT and ambulates with assistance using a rollator with PT. Patient is WC dependent in her  apt. Patient reports that she feels that she was dizzy which precipitated the fall. She reports that she told HHPT that AM that her legs felt shaky.  Patient will benefit from continued inpatient follow up therapy, <3 hours/day.  If plan is discharge home, recommend the following: A lot of help with walking and/or transfers;A little help with bathing/dressing/bathroom;Help with stairs or ramp for entrance;Assist for transportation   Can travel by private vehicle        Equipment Recommendations None recommended by PT  Recommendations for Other Services       Functional Status Assessment Patient has had a recent decline in their functional status and demonstrates the ability to make significant improvements in function in a reasonable and predictable amount of time.     Precautions / Restrictions Precautions Precautions: Fall Precaution/Restrictions Comments: 6 falls in last 6 mos Restrictions Weight Bearing Restrictions Per Provider Order: Yes RUE Weight Bearing Per Provider Order: Non weight bearing       Mobility  Bed Mobility               General bed mobility comments: in recliner    Transfers Overall transfer level: Needs assistance   Transfers: Sit to/from Stand, Bed to chair/wheelchair/BSC Sit to Stand: Min assist   Step pivot transfers: Min assist       General transfer comment: patient stands from recliner and reaches to bed rail, step pivots are slow to trnasfer to bed. then stands from bed and step pivots to recliner,  cues for NWB on the right UE    Ambulation/Gait                  Stairs            Wheelchair Mobility     Tilt Bed    Modified Rankin (Stroke Patients Only)       Balance Overall balance assessment: Needs assistance, History of Falls Sitting-balance support: Feet supported, No upper extremity supported Sitting balance-Leahy Scale: Fair     Standing balance support: During functional activity, Single extremity supported Standing balance-Leahy Scale: Poor                               Pertinent Vitals/Pain Pain Assessment Pain Assessment: Faces Faces Pain Scale: Hurts even more Pain Location: right arm Pain Descriptors / Indicators: Discomfort Pain Intervention(s): Repositioned, Premedicated before session, Monitored during session, Ice applied    Home Living  Additional Comments: Supplements independent living with aide. Pt reported assistance will stop in everyday to do a check-in but will come in 3 days a week to complete household tasks like cleaning and food rpeparation.    Prior Function               Mobility Comments: uses wheelchair primarly and transfers independnetly ADLs Comments: Has been in wheelchair since August, receives PT and OT. PCA assists with LB ADLs, cooking, cleaning.     Extremity/Trunk Assessment   Upper Extremity Assessment Upper Extremity Assessment: Left hand dominant;RUE deficits/detail RUE Deficits / Details: in long arm splint     Lower Extremity Assessment Lower Extremity Assessment: Generalized weakness    Cervical / Trunk Assessment Cervical / Trunk Assessment: Normal  Communication        Cognition Arousal: Alert Behavior During Therapy: WFL for tasks assessed/performed   PT - Cognitive impairments: Orientation   Orientation impairments: Time                             Cueing       General Comments      Exercises     Assessment/Plan    PT Assessment Patient needs continued PT services  PT Problem List Decreased strength;Decreased range of motion;Decreased activity tolerance;Decreased mobility;Decreased balance;Pain       PT Treatment Interventions DME instruction;Gait training;Functional mobility training;Therapeutic activities;Therapeutic exercise;Balance training;Patient/family education    PT Goals (Current goals can be found in the Care Plan section)  Acute Rehab PT Goals Patient Stated Goal: I need more help at  ILF PT Goal Formulation: With patient Time For Goal Achievement: 12/01/23 Potential to Achieve Goals: Fair    Frequency Min 2X/week     Co-evaluation               AM-PAC PT 6 Clicks Mobility  Outcome Measure Help needed turning from your back to your side while in a flat bed without using bedrails?: A Lot Help needed moving from lying on your back to sitting on the side of a flat bed without using bedrails?: A Lot Help needed moving to and from a bed to a chair (including a wheelchair)?: A Lot Help needed standing up from a chair using your arms (e.g., wheelchair or bedside chair)?: A Lot Help needed to walk in hospital room?: Total Help needed climbing 3-5 steps with a railing? : Total 6 Click Score: 10    End of Session Equipment Utilized During Treatment: Gait belt Activity Tolerance: Patient tolerated treatment well Patient left: in chair;with call bell/phone within reach Nurse Communication: Mobility status PT Visit Diagnosis:  Unsteadiness on feet (R26.81);Repeated falls (R29.6);Pain Pain - Right/Left: Right Pain - part of body: Arm    Time: 9090-9066 PT Time Calculation (min) (ACUTE ONLY): 24 min   Charges:   PT Evaluation $PT Eval Low Complexity: 1 Low PT Treatments $Therapeutic Activity: 8-22 mins PT General Charges $$ ACUTE PT VISIT: 1 Visit         Darice Potters PT Acute Rehabilitation Services Office [phone]   Potters Darice Norris 11/17/2023, 10:19 AM

## 2023-11-17 NOTE — Progress Notes (Signed)
 Orthopedic Tech Progress Note Patient Details:  April [name] 06-23-1955 [id number]  Ortho Devices Type of Ortho Device: Shoulder immobilizer Ortho Device/Splint Location: right Ortho Device/Splint Interventions: Ordered, Application, Adjustment   Post Interventions Patient Tolerated: Well Instructions Provided: Adjustment of device, Care of device  Waylan Thom Loving 11/17/2023, 6:20 PM

## 2023-11-17 NOTE — Progress Notes (Signed)
 CSW presented offers to pt. Pt selected [facility]. [facility] rep, Soy, notified. Soy inquired about a monthly injection that pt takes and reported it is a high cost medication. CSW inquired about injection with pt who reports her last shot was on the 29th of Oct and her next shot will be on 11/29. Pt reported the injection is mailed to her.   CSW notified Soy who reported they will work out making sure they pt is able to bring the medication in or have it sent to the facility if she is still there at the time the injection is due.   CSW initiated auth. Auth pending for [employer].

## 2023-11-17 NOTE — ED Notes (Signed)
 Pt states that PRN pain medication was not effective, requested stronger medication. Dr notified.

## 2023-11-17 NOTE — ED Notes (Signed)
 RUE wrist pain pt rates 10/10 , medicated per order PRN Tylenol  100 mg and then OxyIR 5 mg  PO . The RUE has been elevated on 2 pillows and cold packs applied pt reports minimal relief, will alert provider if no change. Pt is A&O x4 and denies any other complaints, nail bed to RUE are pink warm and blanches in < 2 seconds with splint in place. Pt currently describes pain as throbbing aching and constant.

## 2023-11-17 NOTE — ED Provider Notes (Signed)
  Physical Exam  BP 101/74 (BP Location: Left Arm)   Pulse 88   Temp 97.9 F (36.6 C) (Oral)   Resp 18   Ht 5' 6 (1.676 m)   Wt 68 kg   SpO2 95%   BMI 24.20 kg/m   Physical Exam Vitals and nursing note reviewed.  Constitutional:      General: She is not in acute distress.    Appearance: She is well-developed.  HENT:     Head: Normocephalic and atraumatic.  Eyes:     Conjunctiva/sclera: Conjunctivae normal.  Cardiovascular:     Rate and Rhythm: Normal rate and regular rhythm.     Heart sounds: No murmur heard. Pulmonary:     Effort: Pulmonary effort is normal. No respiratory distress.     Breath sounds: Normal breath sounds.  Abdominal:     Palpations: Abdomen is soft.     Tenderness: There is no abdominal tenderness.  Musculoskeletal:        General: No swelling.     Cervical back: Neck supple.  Skin:    General: Skin is warm and dry.     Capillary Refill: Capillary refill takes less than 2 seconds.  Neurological:     Mental Status: She is alert.  Psychiatric:        Mood and Affect: Mood normal.     Procedures  Procedures  ED Course / MDM    Medical Decision Making Amount and/or Complexity of Data Reviewed Labs: ordered. Radiology: ordered.  Risk OTC drugs. Prescription drug management.    Distal radius fracture on the right.  Sugar-tong splint placed.  Lives in independent living.  They have concerns about taking her back given patient's difficulty taking care of her self and new fracture.  Patient will be placed on board status.  All home medications ordered.  Orthopedic referral has been placed.  TOC consult.      Simon Lavonia SAILOR, MD 11/17/23 [phone]

## 2023-11-17 NOTE — ED Notes (Addendum)
 Pt states that she began feeling numbness in her right arm and fingers earlier in the afternoon and that it has not gone away. Limb and bandaging was assessed with no abnormalities found.

## 2023-11-17 NOTE — ED Provider Notes (Signed)
 Went evaluate the patient as she was being new experience of numbness in her little finger and ring finger on her right hand.  Soft compartments in the wrist, able to flex, extend fingers without difficulty.  Warm and well-perfused.  Likely some minor ulnar neuropathy.  Obtained repeat x-ray on the patient, there is not significant displacement of fracture.  Did reach out to hand surgery and spoke with Dr. Ortman who did not believe patient required immediate intervention.   Mannie Pac T, DO 11/17/23 2302

## 2023-11-17 NOTE — Progress Notes (Signed)
 Awaiting PT eval to attach to SNF referral.

## 2023-11-17 NOTE — ED Notes (Signed)
 Two person assist to chair due to unsteady gait. full linen change of bedding, pt is A&O x 3 with NAD,

## 2023-11-18 ENCOUNTER — Other Ambulatory Visit: Payer: Self-pay

## 2023-11-18 MED ORDER — HYDROCODONE-ACETAMINOPHEN 5-325 MG PO TABS: 1.0000 | ORAL_TABLET | Freq: Four times a day (QID) | ORAL | 0 refills | Status: DC | PRN

## 2023-11-18 NOTE — ED Provider Notes (Addendum)
  Physical Exam  BP 113/77 (BP Location: Right Arm)   Pulse 90   Temp 98 F (36.7 C) (Oral)   Resp 18   Ht 5' 6 (1.676 m)   Wt 68 kg   SpO2 95%   BMI 24.20 kg/m   Physical Exam  Procedures  Procedures  ED Course / MDM    Medical Decision Making Amount and/or Complexity of Data Reviewed Labs: ordered. Radiology: ordered.  Risk OTC drugs. Prescription drug management.   Patient would have to pay more for nursing home and does not want to.  Have arranged for home health.  Can follow-up with hand surgery.  Will discharge home.  Discussed with social work.  Home health agency requesting OT and SLP also.  Have ordered this.       [name], MD 11/18/23 1047    [name], MD 11/18/23 1130

## 2023-11-18 NOTE — Care Management (Addendum)
 Administrator HH services at [facility].  Patient was already active but will need a new order to resume services. Secure chat message sent to Dr. Patsey EDP.

## 2023-11-18 NOTE — ED Notes (Signed)
 Patient discharged to home per provider. Patient alert, calm, cooperative, no s/s of distress at time of discharge. Discharge information given to patient. Belongings given to patient. Patient off unit using a w//c. Escorted by RN. Patient transported by [employer] per press photographer.

## 2023-11-18 NOTE — Care Management (Incomplete)
 HH orders faxed to [facility] 336 [phone]

## 2023-11-18 NOTE — Progress Notes (Addendum)
 Auth approved. CSW outreached to [facility] to inform and inquire about admit time. Awaiitng response. PTAR to transport.   Addend @ 10:13AM SNF discovered pt will be in copay days and pt stated she is unable to afford it. She wishes to return to her Independent Living apartment. She reports having a wheelchair and would like to use the Physical Therapy in house. CSW notified RNCM, EDP, and RN via secure chat.   Pt wishes to call someone to transport her back to her apartment, stating it'll save me from another bill. CSW verbalized understanding.

## 2023-11-22 ENCOUNTER — Other Ambulatory Visit (HOSPITAL_COMMUNITY): Payer: Self-pay

## 2023-11-22 ENCOUNTER — Other Ambulatory Visit: Payer: Self-pay

## 2023-11-22 NOTE — Progress Notes (Signed)
 Specialty Pharmacy Refill Coordination Note  April [name] is a 68 y.o. female contacted today regarding refills of specialty medication(s) Mepolizumab  (Nucala )   Patient requested Delivery   Delivery date: 11/30/23   Verified address: [address] [place], [place], [address]   Medication will be filled on: 11/29/23

## 2023-11-24 NOTE — Progress Notes (Deleted)
 Established Patient Pulmonology Office Visit   Subjective:  Patient ID: April [name], female    DOB: 08-Feb-1955  MRN: [id number]  CC: No chief complaint on file.   HPI  April [name] is a 68 y/o F with a PMH significant for Gold 3E COPD (FEV ~30% predicted) who presents for follow up.  Last seen by NP walsh on 10/06/2023, AEC was noted to be 300 cells/uL, started on Mepolizumab .    {PULM QUESTIONNAIRES (Optional):33196}  ROS  {History (Optional):23778}  Current Outpatient Medications:    acetaminophen  (TYLENOL ) 500 MG tablet, Take 1,000 mg by mouth 2 (two) times daily as needed for headache or fever (pain)., Disp: , Rfl:    albuterol  (VENTOLIN  HFA) 108 (90 Base) MCG/ACT inhaler, Inhale 2 puffs into the lungs every 6 (six) hours as needed for wheezing or shortness of breath., Disp: 18 g, Rfl: 3   azithromycin  (ZITHROMAX ) 250 MG tablet, Take 1 tablet (250 mg total) by mouth every Monday, Wednesday, and Friday. (Patient not taking: Reported on 11/17/2023), Disp: 36 tablet, Rfl: 3   budesonide -glycopyrrolate -formoterol  (BREZTRI  AEROSPHERE) 160-9-4.8 MCG/ACT AERO inhaler, Inhale 2 puffs into the lungs every 12 (twelve) hours., Disp: 32.1 g, Rfl: 3   buPROPion  (WELLBUTRIN  XL) 300 MG 24 hr tablet, Take 300 mg by mouth daily., Disp: , Rfl:    Calcium  Carb-Cholecalciferol  (CALCIUM -VITAMIN D  PO), Take 1 tablet by mouth daily., Disp: , Rfl:    carbidopa -levodopa  (SINEMET  IR) 25-100 MG tablet, TAKE 1 TABLET BY MOUTH 3 TIMES A DAY, Disp: 270 tablet, Rfl: 1   Cyanocobalamin  (VITAMIN B 12 PO), Take 1 tablet by mouth at bedtime., Disp: , Rfl:    furosemide  (LASIX ) 20 MG tablet, Take 20 mg by mouth daily as needed for fluid., Disp: , Rfl:    HYDROcodone -acetaminophen  (NORCO/VICODIN) 5-325 MG tablet, Take 1-2 tablets by mouth every 6 (six) hours as needed., Disp: 10 tablet, Rfl: 0   ipratropium-albuterol  (DUONEB) 0.5-2.5 (3) MG/3ML SOLN, Take 3 mLs by nebulization every 6 (six) hours as needed  (shortness of breath)., Disp: , Rfl:    levothyroxine  (SYNTHROID ) 88 MCG tablet, Take 88 mcg by mouth daily before breakfast., Disp: , Rfl:    Mepolizumab  (NUCALA ) 100 MG/ML SOAJ, Inject 1 mL (100 mg total) into the skin every 28 (twenty-eight) days., Disp: 1 mL, Rfl: 3   methocarbamol  (ROBAXIN ) 750 MG tablet, Take 750 mg by mouth 2 (two) times daily as needed for muscle spasms., Disp: , Rfl:    metoprolol  succinate (TOPROL -XL) 25 MG 24 hr tablet, Take 25 mg by mouth at bedtime., Disp: , Rfl:    polyethylene glycol (MIRALAX  / GLYCOLAX ) 17 g packet, Take 17 g by mouth daily as needed for moderate constipation. (Patient not taking: Reported on 11/17/2023), Disp: , Rfl:    potassium chloride  (KLOR-CON  M) 10 MEQ tablet, Take 1 tablet (10 mEq total) by mouth daily as needed (when you take the lasix )., Disp: 30 tablet, Rfl: 3   rosuvastatin  (CRESTOR ) 10 MG tablet, Take 1 tablet (10 mg total) by mouth at bedtime., Disp: 90 tablet, Rfl: 3   sertraline  (ZOLOFT ) 50 MG tablet, Take 50 mg by mouth at bedtime. (Patient not taking: Reported on 11/17/2023), Disp: , Rfl:    SYSTANE ULTRA 0.4-0.3 % SOLN, Place 1 drop into both eyes 2 (two) times daily., Disp: 30 mL, Rfl: 0   tiZANidine  (ZANAFLEX ) 4 MG tablet, Take 4 mg by mouth daily as needed (severe muscle spasms)., Disp: , Rfl:    [Paused] valsartan  (  DIOVAN ) 40 MG tablet, Take 40 mg by mouth at bedtime., Disp: , Rfl:       Objective:  There were no vitals taken for this visit. {Pulm Vitals (Optional):32837}  Physical Exam   Diagnostic Review:  {Labs (Optional):32838}  AEC 300 cells/uL 09/2023  PFTs 12/2018: severe obstructive defect + bronchodilator response  CTA Chest 07/2023: emphysematous changes diffusely, no PE IMPRESSION: No evidence of pulmonary emboli.   No acute abnormality noted.   Aortic Atherosclerosis (ICD10-I70.0) and Emphysema (ICD10-J43.9).    Assessment & Plan:   Assessment & Plan   No orders of the defined types were  placed in this encounter.     No follow-ups on file.   April Fesler, MD

## 2023-11-25 ENCOUNTER — Encounter (HOSPITAL_BASED_OUTPATIENT_CLINIC_OR_DEPARTMENT_OTHER): Admitting: Pulmonary Disease

## 2023-11-26 ENCOUNTER — Other Ambulatory Visit (HOSPITAL_COMMUNITY): Payer: Self-pay

## 2023-12-06 ENCOUNTER — Other Ambulatory Visit: Payer: Self-pay

## 2023-12-06 ENCOUNTER — Emergency Department (HOSPITAL_COMMUNITY)
Admission: EM | Admit: 2023-12-06 | Discharge: 2023-12-06 | Disposition: A | Discharge status: Home / Self Care | Attending: Emergency Medicine | Admitting: Emergency Medicine

## 2023-12-06 ENCOUNTER — Emergency Department (HOSPITAL_COMMUNITY)

## 2023-12-06 DIAGNOSIS — N183 Chronic kidney disease, stage 3 unspecified: Secondary | ICD-10-CM | POA: Insufficient documentation

## 2023-12-06 DIAGNOSIS — J449 Chronic obstructive pulmonary disease, unspecified: Secondary | ICD-10-CM | POA: Insufficient documentation

## 2023-12-06 DIAGNOSIS — G20A1 Parkinson's disease without dyskinesia, without mention of fluctuations: Secondary | ICD-10-CM | POA: Insufficient documentation

## 2023-12-06 DIAGNOSIS — I509 Heart failure, unspecified: Secondary | ICD-10-CM | POA: Insufficient documentation

## 2023-12-06 DIAGNOSIS — W050XXA Fall from non-moving wheelchair, initial encounter: Secondary | ICD-10-CM | POA: Insufficient documentation

## 2023-12-06 DIAGNOSIS — Z79899 Other long term (current) drug therapy: Secondary | ICD-10-CM | POA: Insufficient documentation

## 2023-12-06 DIAGNOSIS — M545 Low back pain, unspecified: Secondary | ICD-10-CM | POA: Insufficient documentation

## 2023-12-06 DIAGNOSIS — W19XXXA Unspecified fall, initial encounter: Secondary | ICD-10-CM

## 2023-12-06 MED ORDER — LIDOCAINE 5 % EX PTCH: 1.0000 | MEDICATED_PATCH | CUTANEOUS | 0 refills | Status: DC

## 2023-12-06 MED ORDER — HYDROCODONE-ACETAMINOPHEN 5-325 MG PO TABS
1.0000 | ORAL_TABLET | Freq: Once | ORAL | Status: AC
  Administered 2023-12-06: 1 via ORAL
  Filled 2023-12-06: qty 1

## 2023-12-06 MED ORDER — DICLOFENAC SODIUM 75 MG PO TBEC: 75.0000 mg | DELAYED_RELEASE_TABLET | Freq: Two times a day (BID) | ORAL | 0 refills | Status: AC

## 2023-12-06 NOTE — Discharge Instructions (Addendum)
 You were seen in the emergency department today for concerns of back pain.  Your imaging of your head, neck, and back was thankfully reassuring.  There is no evidence of any fracture or other injury.  I would recommend they take Tylenol  or ibuprofen for pain as needed.  You can also apply topical medication such as diclofenac  or lidocaine  patches to the areas of pain that you are feeling.  For any concerns of new or worsening symptoms, return to the emergency department.

## 2023-12-06 NOTE — ED Provider Notes (Signed)
 April [name] Provider Note   CSN: [id number] Arrival date & time: 12/06/23  1614     Patient presents with: Fall and Back Pain   OMAH [name] is a 68 y.o. female.  Patient with past history significant for degenerative disc disease, Parkinson's disease, CKD stage III, COPD, CHF presents ED with concerns of fall and back pain.  She reports that she was at home when she tried to transfer from her wheelchair to her bed when she fell landing on her low back.  Endorses pain in this area.  States that she was recently treated for a fracture in her right arm.  Does report head strike but no reported loss of consciousness.  She is not on blood thinners. She states that she has not been feeling weak, dizzy, lightheaded, nauseous, or had any other symptoms develop in conjunction with her fall today.   Fall  Back Pain      Prior to Admission medications   Medication Sig Start Date End Date Taking? Authorizing Provider  diclofenac  (VOLTAREN ) 75 MG EC tablet Take 1 tablet (75 mg total) by mouth 2 (two) times daily for 15 days. 12/06/23 12/21/23 Yes Jesiah Yerby A, PA-C  lidocaine  (LIDODERM ) 5 % Place 1 patch onto the skin daily. Remove & Discard patch within 12 hours or as directed by MD 12/06/23  Yes Brigett Estell A, PA-C  acetaminophen  (TYLENOL ) 500 MG tablet Take 1,000 mg by mouth 2 (two) times daily as needed for headache or fever (pain).    [provider]  albuterol  (VENTOLIN  HFA) 108 (90 Base) MCG/ACT inhaler Inhale 2 puffs into the lungs every 6 (six) hours as needed for wheezing or shortness of breath. 10/06/23   [name], NP  azithromycin  (ZITHROMAX ) 250 MG tablet Take 1 tablet (250 mg total) by mouth every Monday, Wednesday, and Friday. Patient not taking: Reported on 11/17/2023 10/06/23 09/06/24  [name], NP  budesonide -glycopyrrolate -formoterol  (BREZTRI  AEROSPHERE) 160-9-4.8 MCG/ACT AERO inhaler Inhale 2 puffs into  the lungs every 12 (twelve) hours. 10/06/23   [name], NP  buPROPion  (WELLBUTRIN  XL) 300 MG 24 hr tablet Take 300 mg by mouth daily.    [provider]  Calcium  Carb-Cholecalciferol  (CALCIUM -VITAMIN D  PO) Take 1 tablet by mouth daily.    [provider]  carbidopa -levodopa  (SINEMET  IR) 25-100 MG tablet TAKE 1 TABLET BY MOUTH 3 TIMES A DAY 08/03/23   Tat, Asberry RAMAN, DO  Cyanocobalamin  (VITAMIN B 12 PO) Take 1 tablet by mouth at bedtime.    [provider]  furosemide  (LASIX ) 20 MG tablet Take 20 mg by mouth daily as needed for fluid.    [provider]  HYDROcodone -acetaminophen  (NORCO/VICODIN) 5-325 MG tablet Take 1-2 tablets by mouth every 6 (six) hours as needed. 11/18/23   [name], MD  ipratropium-albuterol  (DUONEB) 0.5-2.5 (3) MG/3ML SOLN Take 3 mLs by nebulization every 6 (six) hours as needed (shortness of breath).    [provider]  levothyroxine  (SYNTHROID ) 88 MCG tablet Take 88 mcg by mouth daily before breakfast.    [provider]  Mepolizumab  (NUCALA ) 100 MG/ML SOAJ Inject 1 mL (100 mg total) into the skin every 28 (twenty-eight) days. 10/06/23   Jude Harden GAILS, MD  methocarbamol  (ROBAXIN ) 750 MG tablet Take 750 mg by mouth 2 (two) times daily as needed for muscle spasms.    [provider]  metoprolol  succinate (TOPROL -XL) 25 MG 24 hr tablet Take 25 mg  by mouth at bedtime.    [provider]  polyethylene glycol (MIRALAX  / GLYCOLAX ) 17 g packet Take 17 g by mouth daily as needed for moderate constipation. Patient not taking: Reported on 11/17/2023 09/30/23   [name], [name], DO  potassium chloride  (KLOR-CON  M) 10 MEQ tablet Take 1 tablet (10 mEq total) by mouth daily as needed (when you take the lasix ). 03/01/23   [name], MD  rosuvastatin  (CRESTOR ) 10 MG tablet Take 1 tablet (10 mg total) by mouth at bedtime. 03/01/23   [name], MD  sertraline  (ZOLOFT ) 50 MG tablet Take  50 mg by mouth at bedtime. Patient not taking: Reported on 11/17/2023 07/08/23   [provider]  SYSTANE ULTRA 0.4-0.3 % SOLN Place 1 drop into both eyes 2 (two) times daily. 09/30/23   [name], [name], DO  tiZANidine  (ZANAFLEX ) 4 MG tablet Take 4 mg by mouth daily as needed (severe muscle spasms).    [provider]  valsartan  (DIOVAN ) 40 MG tablet Take 40 mg by mouth at bedtime.    [provider]    Allergies: Sulfa antibiotics, Sulfonamide derivatives, Dilaudid  [hydromorphone  hcl], Fosamax [alendronate sodium], Molds & smuts, Pollen extract, and Wound dressing adhesive    Review of Systems  Musculoskeletal:  Positive for back pain.    Updated Vital Signs BP 103/85 (BP Location: Left Arm)   Pulse 96   Temp (!) 97.5 F (36.4 C) (Oral)   Resp 18   SpO2 96%   Physical Exam Vitals and nursing note reviewed.  Constitutional:      General: She is not in acute distress.    Appearance: She is well-developed.  HENT:     Head: Normocephalic and atraumatic.  Eyes:     Conjunctiva/sclera: Conjunctivae normal.  Cardiovascular:     Rate and Rhythm: Normal rate and regular rhythm.     Heart sounds: No murmur heard. Pulmonary:     Effort: Pulmonary effort is normal. No respiratory distress.     Breath sounds: Normal breath sounds.  Abdominal:     Palpations: Abdomen is soft.     Tenderness: There is no abdominal tenderness.  Musculoskeletal:        General: Tenderness present. No swelling. Normal range of motion.       Arms:     Cervical back: Normal range of motion and neck supple. No rigidity or tenderness.     Comments: Midline lumbar spine tenderness to palpation.  Skin:    General: Skin is warm and dry.     Capillary Refill: Capillary refill takes less than 2 seconds.  Neurological:     Mental Status: She is alert.  Psychiatric:        Mood and Affect: Mood normal.     (all labs ordered are listed, but only abnormal results are displayed) Labs  Reviewed - No data to display  EKG: None  Radiology: CT Cervical Spine Wo Contrast Result Date: 12/06/2023 EXAM: CT CERVICAL, THORACIC, AND LUMBAR SPINE WITHOUT INTRAVENOUS CONTRAST 12/06/2023 05:55:31 PM TECHNIQUE: CT of the cervical, thoracic and lumbar spine was performed without the administration of intravenous contrast. Multiplanar reformatted images are provided for review. Automated exposure control, iterative reconstruction, and/or weight based adjustment of the mA/kV was utilized to reduce the radiation dose to as low as reasonably achievable. COMPARISON: CT cervical spine 11/16/2023; thoracic spine radiograph 07/21/2023 and MRI 01/26/2022; MRI of the lumbar spine 08/16/2023. CLINICAL HISTORY: Neck trauma (Age >= 65y) FINDINGS: CERVICAL SPINE: BONES AND ALIGNMENT: There is  no acute fracture or traumatic malalignment. DEGENERATIVE CHANGES: Mild spondylosis and facet arthropathy throughout the cervical spine. No severe spinal canal narrowing. SOFT TISSUES: There is no prevertebral soft tissue swelling. THORACIC SPINE: BONE AND ALIGNMENT: Chronic inferior endplate compression fracture of T2. Ankylosis of T12 and L1. The vertebral body heights are maintained. No osseous destructive lesion is seen. DEGENERATIVE CHANGES: Atraumatic multilevel spondylosis in the thoracic spine. No severe spinal canal narrowing. No gross bony neural foraminal narrowing of the thoracic spine. SOFT TISSUES: No paraspinal mass or hematoma. LIMITED CHEST: Emphysema. LUMBAR SPINE: BONES AND ALIGNMENT: Bilateral chronic sacral ala fractures. Ankylosis of T12 and L1. The vertebral body heights are maintained. No osseous destructive lesion is seen. DEGENERATIVE CHANGES: Mild multilevel spondylosis and facet arthropathy in the lumbar spine. No severe spinal canal narrowing. No gross spinal canal stenosis or bony neural foraminal narrowing of the lumbar spine. SOFT TISSUES: No paraspinal mass or hematoma. LIMITED RETROPERITONEUM:  Limited images of the retroperitoneum demonstrate no acute abnormality. IMPRESSION: 1. No acute abnormality of the cervical, thoracic, or lumbar spine. 2. Bilateral chronic sacral ala fractures. Electronically signed by: Norman Gatlin MD 12/06/2023 07:00 PM EST RP Workstation: [id number]   CT Thoracic Spine Wo Contrast Result Date: 12/06/2023 EXAM: CT CERVICAL, THORACIC, AND LUMBAR SPINE WITHOUT INTRAVENOUS CONTRAST 12/06/2023 05:55:31 PM TECHNIQUE: CT of the cervical, thoracic and lumbar spine was performed without the administration of intravenous contrast. Multiplanar reformatted images are provided for review. Automated exposure control, iterative reconstruction, and/or weight based adjustment of the mA/kV was utilized to reduce the radiation dose to as low as reasonably achievable. COMPARISON: CT cervical spine 11/16/2023; thoracic spine radiograph 07/21/2023 and MRI 01/26/2022; MRI of the lumbar spine 08/16/2023. CLINICAL HISTORY: Neck trauma (Age >= 65y) FINDINGS: CERVICAL SPINE: BONES AND ALIGNMENT: There is no acute fracture or traumatic malalignment. DEGENERATIVE CHANGES: Mild spondylosis and facet arthropathy throughout the cervical spine. No severe spinal canal narrowing. SOFT TISSUES: There is no prevertebral soft tissue swelling. THORACIC SPINE: BONE AND ALIGNMENT: Chronic inferior endplate compression fracture of T2. Ankylosis of T12 and L1. The vertebral body heights are maintained. No osseous destructive lesion is seen. DEGENERATIVE CHANGES: Atraumatic multilevel spondylosis in the thoracic spine. No severe spinal canal narrowing. No gross bony neural foraminal narrowing of the thoracic spine. SOFT TISSUES: No paraspinal mass or hematoma. LIMITED CHEST: Emphysema. LUMBAR SPINE: BONES AND ALIGNMENT: Bilateral chronic sacral ala fractures. Ankylosis of T12 and L1. The vertebral body heights are maintained. No osseous destructive lesion is seen. DEGENERATIVE CHANGES: Mild multilevel spondylosis and  facet arthropathy in the lumbar spine. No severe spinal canal narrowing. No gross spinal canal stenosis or bony neural foraminal narrowing of the lumbar spine. SOFT TISSUES: No paraspinal mass or hematoma. LIMITED RETROPERITONEUM: Limited images of the retroperitoneum demonstrate no acute abnormality. IMPRESSION: 1. No acute abnormality of the cervical, thoracic, or lumbar spine. 2. Bilateral chronic sacral ala fractures. Electronically signed by: Norman Gatlin MD 12/06/2023 07:00 PM EST RP Workstation: [id number]   CT Lumbar Spine Wo Contrast Result Date: 12/06/2023 EXAM: CT CERVICAL, THORACIC, AND LUMBAR SPINE WITHOUT INTRAVENOUS CONTRAST 12/06/2023 05:55:31 PM TECHNIQUE: CT of the cervical, thoracic and lumbar spine was performed without the administration of intravenous contrast. Multiplanar reformatted images are provided for review. Automated exposure control, iterative reconstruction, and/or weight based adjustment of the mA/kV was utilized to reduce the radiation dose to as low as reasonably achievable. COMPARISON: CT cervical spine 11/16/2023; thoracic spine radiograph 07/21/2023 and MRI 01/26/2022; MRI of the lumbar spine 08/16/2023. CLINICAL  HISTORY: Neck trauma (Age >= 65y) FINDINGS: CERVICAL SPINE: BONES AND ALIGNMENT: There is no acute fracture or traumatic malalignment. DEGENERATIVE CHANGES: Mild spondylosis and facet arthropathy throughout the cervical spine. No severe spinal canal narrowing. SOFT TISSUES: There is no prevertebral soft tissue swelling. THORACIC SPINE: BONE AND ALIGNMENT: Chronic inferior endplate compression fracture of T2. Ankylosis of T12 and L1. The vertebral body heights are maintained. No osseous destructive lesion is seen. DEGENERATIVE CHANGES: Atraumatic multilevel spondylosis in the thoracic spine. No severe spinal canal narrowing. No gross bony neural foraminal narrowing of the thoracic spine. SOFT TISSUES: No paraspinal mass or hematoma. LIMITED CHEST: Emphysema. LUMBAR  SPINE: BONES AND ALIGNMENT: Bilateral chronic sacral ala fractures. Ankylosis of T12 and L1. The vertebral body heights are maintained. No osseous destructive lesion is seen. DEGENERATIVE CHANGES: Mild multilevel spondylosis and facet arthropathy in the lumbar spine. No severe spinal canal narrowing. No gross spinal canal stenosis or bony neural foraminal narrowing of the lumbar spine. SOFT TISSUES: No paraspinal mass or hematoma. LIMITED RETROPERITONEUM: Limited images of the retroperitoneum demonstrate no acute abnormality. IMPRESSION: 1. No acute abnormality of the cervical, thoracic, or lumbar spine. 2. Bilateral chronic sacral ala fractures. Electronically signed by: Norman Gatlin MD 12/06/2023 07:00 PM EST RP Workstation: [id number]   CT Head Wo Contrast Result Date: 12/06/2023 EXAM: CT HEAD WITHOUT 12/06/2023 05:55:31 PM TECHNIQUE: CT of the head was performed without the administration of intravenous contrast. Automated exposure control, iterative reconstruction, and/or weight based adjustment of the mA/kV was utilized to reduce the radiation dose to as low as reasonably achievable. COMPARISON: 11/16/2023 CLINICAL HISTORY: Head trauma, minor (Age >= 65y). FINDINGS: BRAIN AND VENTRICLES: No acute intracranial hemorrhage. No mass effect or midline shift. No extra-axial fluid collection. No evidence of acute infarct. No hydrocephalus. Moderate cerebral and cerebellar volume loss. Mild periventricular white matter disease. ORBITS: Status post bilateral lens replacement. SINUSES AND MASTOIDS: No acute abnormality. SOFT TISSUES AND SKULL: No acute skull fracture. No acute soft tissue abnormality. Carotid siphon vascular calcifications. IMPRESSION: 1. No acute intracranial abnormality. Electronically signed by: Norman Gatlin MD 12/06/2023 06:50 PM EST RP Workstation: [id number]     Procedures   Medications Ordered in the ED  HYDROcodone -acetaminophen  (NORCO/VICODIN) 5-325 MG per tablet 1 tablet (1  tablet Oral Given 12/06/23 1758)                                    Medical Decision Making Amount and/or Complexity of Data Reviewed Radiology: ordered.  Risk Prescription drug management.   This patient presents to the ED for concern of fall, back pain.  Differential diagnosis includes lumbar strain, lumbar fracture, cauda equina syndrome, disc herniation, lumbar radiculopathy    Additional history obtained:  Additional history obtained from chart review   Imaging Studies ordered:  I ordered imaging studies including CT head, CT cervical spine, CT thoracic spine, CT lumbar spine I independently visualized and interpreted imaging which showed chronic bilateral sacral fractures, no acute findings on CT cervical, thoracic, or lumbar spine.  CT head with no acute intracranial normality. I agree with the radiologist interpretation   Medicines ordered and prescription drug management:  I ordered medication including Norco for pain Reevaluation of the patient after these medicines showed that the patient improved I have reviewed the patients home medicines and have made adjustments as needed   Problem List / ED Course:  Patient with past history significant for Parkinson's disease  degenerative disc disease, CKD stage III, COPD, CHF presents to the ED today with concerns of fall and back pain.  She reports that she was trying to transfer from her wheelchair to her bed when she fell.  Reportedly landed on her low back endorsing pain to this area.  She additionally uses a wheelchair at her baseline.  Reported a small bump to the right side of her head due to head impact but denies loss of consciousness.  Not on blood thinners.  Was given 650 mg of Tylenol  by EMS prior to arriving. Exam, there is tenderness to the midline low back.  There is no significant hip pain or pain with unilateral elevation of the legs.  DTRs intact. Based on mechanism, will obtain imaging of the head, neck,  thoracic and lumbar spine.  With history of patient reporting osteoporosis, high concern for possible fracture given areas of pain being all midline.  Norco given for pain control. CT imaging of the head, neck, thoracic and lumbar spines negative for any acute findings.  She reports an improvement with the Norco.  Suspect likely muscle strain versus radiculopathy given reassuring imaging.  Advised patient to follow-up closely with PCP for further evaluation as needed.  Return precautions discussed. She is otherwise stable for outpatient follow up and discharged home.   Social Determinants of Health:  None  Final diagnoses:  Fall, initial encounter  Acute midline low back pain without sciatica    ED Discharge Orders          Ordered    diclofenac  (VOLTAREN ) 75 MG EC tablet  2 times daily        12/06/23 1946    lidocaine  (LIDODERM ) 5 %  Every 24 hours        12/06/23 1947               Adriaan Maltese A, PA-C 12/06/23 1949    Randol Simmonds, MD 12/07/23 [phone]

## 2023-12-06 NOTE — ED Triage Notes (Addendum)
 PT arrives via EMS from [facility]. Pt fell backward while attempting to transfer from her wheelchair to her bed. At approx 13:00 today. Initially pt felt ok, but as time passed, pt began to have low back pain. PT has a hx of Parkinson's disease, and can only stand long enough to transfer. Pt uses a wheelchair at baseline. PT reports a hematoma to the RIGHT side of her head.   EMS administered 650mg  Tylenol .

## 2023-12-08 ENCOUNTER — Encounter (HOSPITAL_COMMUNITY): Payer: Self-pay

## 2023-12-08 ENCOUNTER — Emergency Department (HOSPITAL_COMMUNITY)

## 2023-12-08 ENCOUNTER — Inpatient Hospital Stay (HOSPITAL_COMMUNITY)
Admission: EM | Admit: 2023-12-08 | Discharge: 2023-12-13 | DRG: 982 | Disposition: A | Discharge status: Skilled Nursing Facility | Source: Skilled Nursing Facility | Attending: Internal Medicine | Admitting: Internal Medicine

## 2023-12-08 ENCOUNTER — Other Ambulatory Visit: Payer: Self-pay

## 2023-12-08 ENCOUNTER — Ambulatory Visit: Admitting: Student in an Organized Health Care Education/Training Program

## 2023-12-08 ENCOUNTER — Ambulatory Visit: Payer: Self-pay | Admitting: Primary Care

## 2023-12-08 DIAGNOSIS — S32009A Unspecified fracture of unspecified lumbar vertebra, initial encounter for closed fracture: Secondary | ICD-10-CM | POA: Insufficient documentation

## 2023-12-08 DIAGNOSIS — J9611 Chronic respiratory failure with hypoxia: Secondary | ICD-10-CM | POA: Insufficient documentation

## 2023-12-08 DIAGNOSIS — J441 Chronic obstructive pulmonary disease with (acute) exacerbation: Principal | ICD-10-CM | POA: Diagnosis present

## 2023-12-08 DIAGNOSIS — E872 Acidosis, unspecified: Secondary | ICD-10-CM | POA: Insufficient documentation

## 2023-12-08 DIAGNOSIS — S32028A Other fracture of second lumbar vertebra, initial encounter for closed fracture: Secondary | ICD-10-CM

## 2023-12-08 DIAGNOSIS — E785 Hyperlipidemia, unspecified: Secondary | ICD-10-CM | POA: Diagnosis present

## 2023-12-08 DIAGNOSIS — N183 Chronic kidney disease, stage 3 unspecified: Secondary | ICD-10-CM | POA: Diagnosis present

## 2023-12-08 DIAGNOSIS — F411 Generalized anxiety disorder: Secondary | ICD-10-CM | POA: Insufficient documentation

## 2023-12-08 DIAGNOSIS — I10 Essential (primary) hypertension: Secondary | ICD-10-CM | POA: Diagnosis present

## 2023-12-08 DIAGNOSIS — M353 Polymyalgia rheumatica: Secondary | ICD-10-CM | POA: Diagnosis present

## 2023-12-08 LAB — I-STAT CG4 LACTIC ACID, ED
Lactic Acid, Venous: 7.1 mmol/L (ref 0.5–1.9)
Lactic Acid, Venous: 3 mmol/L (ref 0.5–1.9)

## 2023-12-08 LAB — I-STAT VENOUS BLOOD GAS, ED
Acid-base deficit: 5 mmol/L — ABNORMAL HIGH (ref 0.0–2.0)
Bicarbonate: 19.1 mmol/L — ABNORMAL LOW (ref 20.0–28.0)
Calcium, Ion: 1.07 mmol/L — ABNORMAL LOW (ref 1.15–1.40)
HCT: 29 % — ABNORMAL LOW (ref 36.0–46.0)
Hemoglobin: 9.9 g/dL — ABNORMAL LOW (ref 12.0–15.0)
O2 Saturation: 78 %
Potassium: 3.8 mmol/L (ref 3.5–5.1)
Sodium: 136 mmol/L (ref 135–145)
TCO2: 20 mmol/L — ABNORMAL LOW (ref 22–32)
pCO2, Ven: 32.7 mmHg — ABNORMAL LOW (ref 44–60)
pH, Ven: 7.375 (ref 7.25–7.43)
pO2, Ven: 43 mmHg (ref 32–45)

## 2023-12-08 LAB — LACTIC ACID, PLASMA
Lactic Acid, Venous: 1.7 mmol/L (ref 0.5–1.9)
Lactic Acid, Venous: 2 mmol/L (ref 0.5–1.9)

## 2023-12-08 LAB — CBC
HCT: 39.9 % (ref 36.0–46.0)
Hemoglobin: 13 g/dL (ref 12.0–15.0)
MCH: 31 pg (ref 26.0–34.0)
MCHC: 32.6 g/dL (ref 30.0–36.0)
MCV: 95 fL (ref 80.0–100.0)
Platelets: 194 K/uL (ref 150–400)
RBC: 4.2 MIL/uL (ref 3.87–5.11)
RDW: 14.7 % (ref 11.5–15.5)
WBC: 11.4 K/uL — ABNORMAL HIGH (ref 4.0–10.5)
nRBC: 0 % (ref 0.0–0.2)

## 2023-12-08 LAB — TROPONIN I (HIGH SENSITIVITY)
Troponin I (High Sensitivity): 46 ng/L — ABNORMAL HIGH (ref <18)
Troponin I (High Sensitivity): 33 ng/L — ABNORMAL HIGH (ref <18)

## 2023-12-08 LAB — BASIC METABOLIC PANEL WITH GFR
Anion gap: 16 — ABNORMAL HIGH (ref 5–15)
BUN: 26 mg/dL — ABNORMAL HIGH (ref 8–23)
CO2: 23 mmol/L (ref 22–32)
Calcium: 9.4 mg/dL (ref 8.9–10.3)
Chloride: 100 mmol/L (ref 98–111)
Creatinine, Ser: 1.46 mg/dL — ABNORMAL HIGH (ref 0.44–1.00)
GFR, Estimated: 38 mL/min — ABNORMAL LOW (ref >60)
Glucose, Bld: 122 mg/dL — ABNORMAL HIGH (ref 70–99)
Potassium: 4.2 mmol/L (ref 3.5–5.1)
Sodium: 139 mmol/L (ref 135–145)

## 2023-12-08 MED ORDER — ALBUTEROL SULFATE (2.5 MG/3ML) 0.083% IN NEBU
15.0000 mg/h | INHALATION_SOLUTION | Freq: Once | RESPIRATORY_TRACT | Status: AC
  Administered 2023-12-08: 15 mg/h via RESPIRATORY_TRACT
  Filled 2023-12-08: qty 18

## 2023-12-08 MED ORDER — IPRATROPIUM-ALBUTEROL 0.5-2.5 (3) MG/3ML IN SOLN
3.0000 mL | Freq: Once | RESPIRATORY_TRACT | Status: AC
  Administered 2023-12-09: 3 mL via RESPIRATORY_TRACT
  Filled 2023-12-08: qty 3

## 2023-12-08 MED ORDER — IPRATROPIUM BROMIDE 0.02 % IN SOLN
0.5000 mg | Freq: Once | RESPIRATORY_TRACT | Status: AC
  Administered 2023-12-08: 0.5 mg via RESPIRATORY_TRACT
  Filled 2023-12-08: qty 2.5

## 2023-12-08 MED ORDER — ACETAMINOPHEN 325 MG PO TABS
650.0000 mg | ORAL_TABLET | Freq: Once | ORAL | Status: AC
  Administered 2023-12-08: 650 mg via ORAL
  Filled 2023-12-08: qty 2

## 2023-12-08 MED ORDER — LACTATED RINGERS IV BOLUS
1000.0000 mL | Freq: Once | INTRAVENOUS | Status: AC
  Administered 2023-12-08: 1000 mL via INTRAVENOUS

## 2023-12-08 MED ORDER — LIDOCAINE 5 % EX PTCH
1.0000 | MEDICATED_PATCH | Freq: Once | CUTANEOUS | Status: AC
  Administered 2023-12-08: 1 via TRANSDERMAL
  Filled 2023-12-08: qty 1

## 2023-12-08 MED ORDER — IOHEXOL 350 MG/ML SOLN
75.0000 mL | Freq: Once | INTRAVENOUS | Status: AC | PRN
  Administered 2023-12-08: 75 mL via INTRAVENOUS

## 2023-12-08 MED ORDER — OXYCODONE-ACETAMINOPHEN 5-325 MG PO TABS
1.0000 | ORAL_TABLET | Freq: Once | ORAL | Status: AC
  Administered 2023-12-09: 1 via ORAL
  Filled 2023-12-08: qty 1

## 2023-12-08 NOTE — ED Notes (Signed)
 Pt requests to have her bipap off, removed bipap, pt still has expiratory wheezes, pt has decreased work of breathing, pt satting 97% on room air.

## 2023-12-08 NOTE — ED Triage Notes (Signed)
 Pt to er via ems from [facility], pt states that she is here for sob, pt has expiratory wheezes, per ems they gave 125 of solumedrol and 7.5 of albuterol .  Pt in bed, pt has expiratory wheezes and accessory muscle use.

## 2023-12-08 NOTE — Telephone Encounter (Signed)
 Per Dr. Isadora, Happy to see her in person, but a tele-health visit would not be helpful and the only thing I could do is prescribe her prednisone . she needs to be evaluated, and possibly have a CT/Angio ordered.   Patient advised. NFN.

## 2023-12-08 NOTE — Telephone Encounter (Signed)
 [email or web address] Pulmonary Triage - Initial Assessment Questions Chief Complaint (e.g., cough, sob, wheezing, fever, chills, sweat or additional symptoms) *Go to specific symptom protocol after initial questions. SOB, wheezing. Asthma attacks.  How long have symptoms been present? Asthma attack last night and again this morning around 6am.  Have you tested for COVID or Flu? Note: If not, ask patient if a home test can be taken. If so, instruct patient to call back for positive results. No  MEDICINES:   Have you used any OTC meds to help with symptoms? No If yes, ask What medications? N/A.  Have you used your inhalers/maintenance medication? Yes If yes, What medications? Duoneb, albuterol  inhaler, Breztri   If inhaler, ask How many puffs and how often? Note: Review instructions on medication in the chart. 2 puffs of albuterol  once this morning ; 2 puffs of Breztri  bid; nebulizer one treatment this morning.  OXYGEN : Do you wear supplemental oxygen ? Yes If yes, How many liters are you supposed to use? 2L as needed  Do you monitor your oxygen  levels? Yes If yes, What is your reading (oxygen  level) today? 92-93%, coming up to 96% while on oxygen   What is your usual oxygen  saturation reading?  (Note: Pulmonary O2 sats should be 90% or greater) 95-96%          Copied from CRM #[id number]. Topic: Clinical - Red Word Triage >> Dec 08, 2023 12:01 PM Chantha C wrote: Red Word that prompted transfer to Nurse Triage: Patient's speech therapist Lynder [phone] and patient wants to see be seen virtually patient hurt her back last Monday, she fell and cannot come into the office. Patient states had an asthma attack this morning, gasping for air, shortness of breath, wheezing, could not breath, and low back pain. Patient denies dizziness, nor fever. Patient has a TOC appointment for 12/24/23 with Dr. Catherine. Please advise. >> Dec 08, 2023 12:07 PM Chantha C wrote: Patient  states cannot hold any longer and wants NT to call patient back. Please advise and call back [phone]. Reason for Disposition  [1] Wheezing or coughing AND [2] hasn't used neb or inhaler (up to 3 treatments given 20 minutes apart) AND [3] it's available  [1] MILD asthma attack (e.g., no SOB at rest, mild SOB with walking, speaks normally in sentences, mild wheezing) AND [2] lasting > 24 hours on prescribed treatment  Answer Assessment - Initial Assessment Questions E2C2 Pulmonary Triage - Initial Assessment Questions Chief Complaint (e.g., cough, sob, wheezing, fever, chills, sweat or additional symptoms) *Go to specific symptom protocol after initial questions. SOB, wheezing. Asthma attacks.  How long have symptoms been present? Asthma attack last night and again this morning around 6am.  Have you tested for COVID or Flu? Note: If not, ask patient if a home test can be taken. If so, instruct patient to call back for positive results. No  MEDICINES:   Have you used any OTC meds to help with symptoms? No If yes, ask What medications? N/A.  Have you used your inhalers/maintenance medication? Yes If yes, What medications? Duoneb, albuterol  inhaler, Breztri   If inhaler, ask How many puffs and how often? Note: Review instructions on medication in the chart. 2 puffs of albuterol  once this morning ; 2 puffs of Breztri  bid; nebulizer one treatment this morning.  OXYGEN : Do you wear supplemental oxygen ? Yes If yes, How many liters are you supposed to use? 2L as needed  Do you monitor your oxygen  levels? Yes If yes, What is your reading (oxygen  level)  today? 92-93%, coming up to 96% while on oxygen . Staff member at bedside said patient only had her O2 at 1L and her cannula was not in her nose, it was hanging out. Once staff member adjusted, SpO2 came up to 96%  What is your usual oxygen  saturation reading?  (Note: Pulmonary O2 sats should be 90% or  greater) 95-96%     1. RESPIRATORY STATUS: Describe your breathing? (e.g., wheezing, shortness of breath, unable to speak, severe coughing)      SOB, wheezing.  2. ONSET: When did this asthma attack begin?      This morning around 6am.  3. TRIGGER: What do you think triggered this attack? (e.g., URI, exposure to pollen or other allergen, tobacco smoke)      Fall on Monday.  4. PEAK EXPIRATORY FLOW RATE (PEFR): Do you use a peak flow meter? If Yes, ask: What's the current peak flow? What's your personal best peak flow?      Yes, sometimes but not every day. No recent use, she states she couldn't use it today or yesterday due to her breathing.  5. SEVERITY: How bad is this attack?      Moderate. Patient states she feels improved after 2 puffs of her albuterol  inhaler. No audible wheezing noted.  6. ASTHMA MEDICINES:  What treatments have you tried?      1 nebulizer treatment around 3 hours ago, Breztri  2 puffs, albuterol  2 puffs and has used it twice. Staff member  7. INHALED QUICK-RELIEF TREATMENTS FOR THIS ATTACK: What treatments have you given yourself so far? and How many and how often? If using an inhaler, ask, How many puffs? Note: Routine treatments are 2 puffs every 4 hours as needed. Rescue treatments are 4 puffs repeated every 20 minutes, up to three times as needed.      Patient just took 2 puffs of her albuterol  rescue inhaler. Staff member is at bedside with patient and determined she was using an empty albuterol  inhaler this morning which is why she did not see any improvement. Staff member was able to help patient get her new albuterol  inhaler and take 2 puffs which helped patient significantly with breathing.  8. OTHER SYMPTOMS: Do you have any other symptoms? (e.g., chest pain, coughing up yellow sputum, fever, runny nose)     She states she had some thick mucous in her throat recently but unsure if she was able to get it out. No cough. No fever. Right  sided chest pain I have that all the time, 2/10 and comes and goes.  9. O2 SATURATION MONITOR:  Do you use an oxygen  saturation monitor (pulse oximeter) at home? If Yes, What is your reading (oxygen  level) today? What is your usual oxygen  saturation reading? (e.g., 95%)     96%.  Protocols used: Asthma Attack-A-AH

## 2023-12-08 NOTE — ED Provider Notes (Addendum)
 [place] EMERGENCY DEPARTMENT AT [facility] Provider Note   CSN: [id number] Arrival date & time: 12/08/23  8365     Patient presents with: Shortness of Breath   April [name] is a 68 y.o. female.   This is a 68 year old female presenting emergency department for shortness of breath.  Started yesterday.  Does have a history of COPD not on oxygen .  Received Solu-Medrol  and breathing treatment prior to arrival with some improvement.  Not having chest pain.  Is complaining of back pain from her fall a few days ago.  No chest pain, abdominal pain, no nausea or vomiting.   Shortness of Breath      Prior to Admission medications   Medication Sig Start Date End Date Taking? Authorizing Provider  acetaminophen  (TYLENOL ) 500 MG tablet Take 1,000 mg by mouth 2 (two) times daily as needed for headache or fever (pain).    [provider]  albuterol  (VENTOLIN  HFA) 108 (90 Base) MCG/ACT inhaler Inhale 2 puffs into the lungs every 6 (six) hours as needed for wheezing or shortness of breath. 10/06/23   [name], NP  azithromycin  (ZITHROMAX ) 250 MG tablet Take 1 tablet (250 mg total) by mouth every Monday, Wednesday, and Friday. Patient not taking: Reported on 11/17/2023 10/06/23 09/06/24  [name], NP  budesonide -glycopyrrolate -formoterol  (BREZTRI  AEROSPHERE) 160-9-4.8 MCG/ACT AERO inhaler Inhale 2 puffs into the lungs every 12 (twelve) hours. 10/06/23   [name], NP  buPROPion  (WELLBUTRIN  XL) 300 MG 24 hr tablet Take 300 mg by mouth daily.    [provider]  Calcium  Carb-Cholecalciferol  (CALCIUM -VITAMIN D  PO) Take 1 tablet by mouth daily.    [provider]  carbidopa -levodopa  (SINEMET  IR) 25-100 MG tablet TAKE 1 TABLET BY MOUTH 3 TIMES A DAY 08/03/23   Tat, Rebecca S, DO  Cyanocobalamin  (VITAMIN B 12 PO) Take 1 tablet by mouth at bedtime.    [provider]  diclofenac  (VOLTAREN ) 75 MG EC tablet Take 1 tablet (75 mg total)  by mouth 2 (two) times daily for 15 days. 12/06/23 12/21/23  Zelaya, Oscar A, PA-C  furosemide  (LASIX ) 20 MG tablet Take 20 mg by mouth daily as needed for fluid.    [provider]  HYDROcodone -acetaminophen  (NORCO/VICODIN) 5-325 MG tablet Take 1-2 tablets by mouth every 6 (six) hours as needed. 11/18/23   [name], MD  ipratropium-albuterol  (DUONEB) 0.5-2.5 (3) MG/3ML SOLN Take 3 mLs by nebulization every 6 (six) hours as needed (shortness of breath).    [provider]  levothyroxine  (SYNTHROID ) 88 MCG tablet Take 88 mcg by mouth daily before breakfast.    [provider]  lidocaine  (LIDODERM ) 5 % Place 1 patch onto the skin daily. Remove & Discard patch within 12 hours or as directed by MD 12/06/23   Zelaya, Oscar A, PA-C  Mepolizumab  (NUCALA ) 100 MG/ML SOAJ Inject 1 mL (100 mg total) into the skin every 28 (twenty-eight) days. 10/06/23   Jude Harden GAILS, MD  methocarbamol  (ROBAXIN ) 750 MG tablet Take 750 mg by mouth 2 (two) times daily as needed for muscle spasms.    [provider]  metoprolol  succinate (TOPROL -XL) 25 MG 24 hr tablet Take 25 mg by mouth at bedtime.    [provider]  polyethylene glycol (MIRALAX  / GLYCOLAX ) 17 g packet Take 17 g by mouth daily as needed for moderate constipation. Patient not taking: Reported on 11/17/2023 09/30/23   [name], [name], DO  potassium chloride  (KLOR-CON  M) 10 MEQ tablet  Take 1 tablet (10 mEq total) by mouth daily as needed (when you take the lasix ). 03/01/23   [name], MD  rosuvastatin  (CRESTOR ) 10 MG tablet Take 1 tablet (10 mg total) by mouth at bedtime. 03/01/23   [name], MD  sertraline  (ZOLOFT ) 50 MG tablet Take 50 mg by mouth at bedtime. Patient not taking: Reported on 11/17/2023 07/08/23   [provider]  SYSTANE ULTRA 0.4-0.3 % SOLN Place 1 drop into both eyes 2 (two) times daily. 09/30/23   [name], [name], DO  tiZANidine  (ZANAFLEX ) 4 MG tablet Take 4 mg by  mouth daily as needed (severe muscle spasms).    [provider]  valsartan  (DIOVAN ) 40 MG tablet Take 40 mg by mouth at bedtime.    [provider]    Allergies: Sulfa antibiotics, Sulfonamide derivatives, Dilaudid  [hydromorphone  hcl], Fosamax [alendronate sodium], Molds & smuts, Pollen extract, and Wound dressing adhesive    Review of Systems  Respiratory:  Positive for shortness of breath.     Updated Vital Signs BP 116/65   Pulse (!) 124   Temp 98 F (36.7 C) (Oral)   Resp 20   Ht 5' 6 (1.676 m)   Wt 59 kg   SpO2 96%   BMI 20.98 kg/m   Physical Exam Vitals and nursing note reviewed.  Constitutional:      General: She is not in acute distress.    Appearance: She is not toxic-appearing.  HENT:     Head: Normocephalic.     Nose: Nose normal.     Mouth/Throat:     Mouth: Mucous membranes are moist.  Eyes:     Conjunctiva/sclera: Conjunctivae normal.  Cardiovascular:     Rate and Rhythm: Normal rate and regular rhythm.     Heart sounds: Normal heart sounds.  Pulmonary:     Effort: Respiratory distress present.     Breath sounds: Wheezing present.  Abdominal:     General: Abdomen is flat. There is no distension.     Palpations: Abdomen is soft.     Tenderness: There is no abdominal tenderness. There is no guarding or rebound.  Skin:    General: Skin is warm and dry.     Capillary Refill: Capillary refill takes less than 2 seconds.  Neurological:     Mental Status: She is alert and oriented to person, place, and time.  Psychiatric:        Mood and Affect: Mood normal.        Behavior: Behavior normal.     (all labs ordered are listed, but only abnormal results are displayed) Labs Reviewed  CBC - Abnormal; Notable for the following components:      Result Value   WBC 11.4 (*)    All other components within normal limits  BASIC METABOLIC PANEL WITH GFR - Abnormal; Notable for the following components:   Glucose, Bld 122 (*)    BUN 26 (*)     Creatinine, Ser 1.46 (*)    GFR, Estimated 38 (*)    Anion gap 16 (*)    All other components within normal limits  LACTIC ACID, PLASMA - Abnormal; Notable for the following components:   Lactic Acid, Venous 2.0 (*)    All other components within normal limits  I-STAT VENOUS BLOOD GAS, ED - Abnormal; Notable for the following components:   pCO2, Ven 32.7 (*)    Bicarbonate 19.1 (*)    TCO2 20 (*)    Acid-base deficit 5.0 (*)  Calcium , Ion 1.07 (*)    HCT 29.0 (*)    Hemoglobin 9.9 (*)    All other components within normal limits  I-STAT CG4 LACTIC ACID, ED - Abnormal; Notable for the following components:   Lactic Acid, Venous 7.1 (*)    All other components within normal limits  I-STAT CG4 LACTIC ACID, ED - Abnormal; Notable for the following components:   Lactic Acid, Venous 3.0 (*)    All other components within normal limits  TROPONIN I (HIGH SENSITIVITY) - Abnormal; Notable for the following components:   Troponin I (High Sensitivity) 46 (*)    All other components within normal limits  TROPONIN I (HIGH SENSITIVITY) - Abnormal; Notable for the following components:   Troponin I (High Sensitivity) 33 (*)    All other components within normal limits  CULTURE, BLOOD (ROUTINE X 2)  CULTURE, BLOOD (ROUTINE X 2)  LACTIC ACID, PLASMA  URINALYSIS, ROUTINE W REFLEX MICROSCOPIC    EKG: EKG Interpretation Date/Time:  Wednesday December 08 2023 16:35:48 EST Ventricular Rate:  136 PR Interval:  148 QRS Duration:  82 QT Interval:  336 QTC Calculation: 506 R Axis:   92  Text Interpretation: Sinus tachycardia Multiform ventricular premature complexes Right axis deviation Nonspecific repol abnormality, diffuse leads Prolonged QT interval Artifact in lead(s) I II III aVR aVL aVF V1 V2 V3 V4 V5 V6 and baseline wander in lead(s) II III aVR aVL aVF V3 V4 V6 Confirmed by Neysa Clap [phone]) on 12/08/2023 7:21:31 PM  Radiology: CT Angio Chest PE W and/or Wo Contrast Result Date:  12/08/2023 EXAM: CTA of the Chest with and without contrast for PE 12/08/2023 10:04:24 PM TECHNIQUE: CTA of the chest was performed after the administration of 75 mL of iohexol  (OMNIPAQUE ) 350 MG/ML injection. Multiplanar reformatted images are provided for review. MIP images are provided for review. Automated exposure control, iterative reconstruction, and/or weight based adjustment of the mA/kV was utilized to reduce the radiation dose to as low as reasonably achievable. COMPARISON: Remote prior examination 12/14/2022. CLINICAL HISTORY: Pulmonary embolism (PE) suspected, high prob. FINDINGS: PULMONARY ARTERIES: Pulmonary arteries are adequately opacified for evaluation. No pulmonary embolism. Main pulmonary artery is normal in caliber. MEDIASTINUM: The heart and pericardium demonstrate no acute abnormality. Mild atherosclerotic calcification within the thoracic aorta. LYMPH NODES: No mediastinal, hilar or axillary lymphadenopathy. LUNGS AND PLEURA: Severe emphysema. Motion artifact slightly limits evaluation of the pulmonary parenchyma. 5 mm noncalcified pulmonary nodule within the right apex (23/6) stable since remote prior examination 12/14/2022. No pneumothorax or pleural effusion. UPPER ABDOMEN: Status post cholecystectomy. Limited images of the upper abdomen are otherwise unremarkable. SOFT TISSUES AND BONES: Segmentation anomaly with incomplete segmentation T12-L1. Superior endplate fracture L2 appears acute, and appears new from, it is incompletely included on this examination and not well assessed. IMPRESSION: 1. No pulmonary embolism. 2. Suspected acute superior endplate fracture of L2, new from prior examination, incompletely included and not well assessed. Correlation for point tenderness and possible dedicated CT imaging of the lumbar spine is recommended for further evaluation. 3. Severe emphysema. Electronically signed by: Dorethia Molt MD 12/08/2023 11:23 PM EST RP Workstation: [id number]   DG  Chest Portable 1 View Result Date: 12/08/2023 EXAM: 1 VIEW(S) XRAY OF THE CHEST 12/08/2023 04:59:10 PM COMPARISON: 09/29/2023 CLINICAL HISTORY: COPD/SHOB FINDINGS: LUNGS AND PLEURA: Stable hyperinflation of the lungs. No focal pulmonary opacity. No pleural effusion. No pneumothorax. HEART AND MEDIASTINUM: No acute abnormality of the cardiac and mediastinal silhouettes. BONES AND SOFT TISSUES: Status post left  shoulder arthroplasty. No acute osseous abnormality. IMPRESSION: 1. Stable hyperinflation of the lungs, compatible with COPD. Electronically signed by: Lynwood Seip MD 12/08/2023 05:42 PM EST RP Workstation: [id number]     .Critical Care  Performed by: Neysa Caron PARAS, DO Authorized by: Neysa Caron PARAS, DO   Critical care provider statement:    Critical care time (minutes):  30   Critical care was necessary to treat or prevent imminent or life-threatening deterioration of the following conditions:  Respiratory failure   Critical care was time spent personally by me on the following activities:  Development of treatment plan with patient or surrogate, discussions with consultants, evaluation of patient's response to treatment, examination of patient, ordering and review of laboratory studies, ordering and review of radiographic studies, ordering and performing treatments and interventions, pulse oximetry, re-evaluation of patient's condition and review of old charts    Medications Ordered in the ED  lidocaine  (LIDODERM ) 5 % 1 patch (1 patch Transdermal Patch Applied 12/08/23 1936)  ipratropium-albuterol  (DUONEB) 0.5-2.5 (3) MG/3ML nebulizer solution 3 mL (has no administration in time range)  oxyCODONE -acetaminophen  (PERCOCET/ROXICET) 5-325 MG per tablet 1 tablet (has no administration in time range)  albuterol  (PROVENTIL ) (2.5 MG/3ML) 0.083% nebulizer solution (15 mg/hr Nebulization Given 12/08/23 1719)  ipratropium (ATROVENT ) nebulizer solution 0.5 mg (0.5 mg Nebulization Given 12/08/23 1719)   lactated ringers  bolus 1,000 mL (0 mLs Intravenous Stopped 12/08/23 1848)  acetaminophen  (TYLENOL ) tablet 650 mg (650 mg Oral Given 12/08/23 1935)  iohexol  (OMNIPAQUE ) 350 MG/ML injection 75 mL (75 mLs Intravenous Contrast Given 12/08/23 2205)    Clinical Course as of 12/08/23 2350  Wed Dec 08, 2023  1646 Patient and moderate to severe respiratory distress.  Significant work of breathing accessory muscle use.  Speaking in short sentences.  Diffuse wheezing and poor air movement.  Is saturating 100% however with residual facemask oxygen  from EMS breathing treatment.  Will place patient on BiPAP start hour-long breathing treatment. [TY]  1721 Lactic Acid, Venous(!!): 7.1 [TY]  1726 Lactic Acid, Venous(!!): 7.1 On bipap and receiving fluids.  But surprising value will get plasma level to recheck. [TY]  1739 Work of breathing has significantly improved while on BiPAP. [TY]  1833 Lactic Acid, Venous: 1.7 Recheck in 1.7 which is more to be expected [TY]  1913 Lactic Acid, Venous(!!): 3.0 Repeat i-STAT elevated however plasma was normal.  I suspect possible lab error for the discrepancy [TY]  1913 Basic metabolic panel(!) Stable CKD [TY]  1914 Troponin I (High Sensitivity)(!): 46 Suspect demand.  Will trend. Although clinically patient with COPD exacerbation [TY]  2344 CT Angio Chest PE W and/or Wo Contrast IMPRESSION: 1. No pulmonary embolism. 2. Suspected acute superior endplate fracture of L2, new from prior examination, incompletely included and not well assessed. Correlation for point tenderness and possible dedicated CT imaging of the lumbar spine is recommended for further evaluation. 3. Severe emphysema.  Electronically signed by: Dorethia Molt MD 12/08/2023 11:23 PM EST RP Workstation: [id number]   [TY]    Clinical Course User Index [TY] Neysa Caron PARAS, DO                                 Medical Decision Making This is a 68 year old female presenting emergency department for  shortness of breath.  Complex past medical history to include CAD, COPD, CAD, hypertension, CKD.  Arrived in severe respiratory distress.  Was placed on BiPAP received hour-long treatment, was  able to come off BiPAP after treatment with significant improvement..  No report of hypoxia from EMS.  They did give Solu-Medrol  and 7.5 albuterol .  Work of breathing significantly improved.  Patient remains significantly tachycardic, unclear if this is secondary to her back pain or medication related.  Concern for possible PE, however CTA was negative.  She has no fever.  Had mildly elevated lactate, improved with fluids and breathing treatments.  Mild leukocytosis.  Mild elevation in troponin, but no chest pain, downtrending.  Likely rate related.  CTA did show possible fracture at L2.  This was not apparent on imaging done 2 days ago the emergency department.  Given Percocet for pain.  Will admit for COPD exacerbation as she still has some mild labored breathing and some mild wheezing.   Amount and/or Complexity of Data Reviewed Labs: ordered. Decision-making details documented in ED Course. Radiology: ordered. Decision-making details documented in ED Course.  Risk OTC drugs. Prescription drug management. Decision regarding hospitalization.      Final diagnoses:  None    ED Discharge Orders     None          Neysa Caron PARAS, DO 12/08/23 2349    Neysa Caron PARAS, DO 12/08/23 2350

## 2023-12-09 ENCOUNTER — Encounter (HOSPITAL_COMMUNITY): Payer: Self-pay | Admitting: Internal Medicine

## 2023-12-09 ENCOUNTER — Emergency Department (HOSPITAL_COMMUNITY)

## 2023-12-09 DIAGNOSIS — E872 Acidosis, unspecified: Secondary | ICD-10-CM | POA: Insufficient documentation

## 2023-12-09 DIAGNOSIS — S32009A Unspecified fracture of unspecified lumbar vertebra, initial encounter for closed fracture: Secondary | ICD-10-CM | POA: Insufficient documentation

## 2023-12-09 DIAGNOSIS — J9611 Chronic respiratory failure with hypoxia: Secondary | ICD-10-CM | POA: Diagnosis not present

## 2023-12-09 DIAGNOSIS — N1831 Chronic kidney disease, stage 3a: Secondary | ICD-10-CM | POA: Diagnosis not present

## 2023-12-09 DIAGNOSIS — I10 Essential (primary) hypertension: Secondary | ICD-10-CM | POA: Diagnosis not present

## 2023-12-09 DIAGNOSIS — J441 Chronic obstructive pulmonary disease with (acute) exacerbation: Secondary | ICD-10-CM | POA: Diagnosis not present

## 2023-12-09 DIAGNOSIS — S32000A Wedge compression fracture of unspecified lumbar vertebra, initial encounter for closed fracture: Secondary | ICD-10-CM

## 2023-12-09 DIAGNOSIS — E7849 Other hyperlipidemia: Secondary | ICD-10-CM

## 2023-12-09 DIAGNOSIS — F411 Generalized anxiety disorder: Secondary | ICD-10-CM | POA: Insufficient documentation

## 2023-12-09 DIAGNOSIS — S32020A Wedge compression fracture of second lumbar vertebra, initial encounter for closed fracture: Secondary | ICD-10-CM

## 2023-12-09 LAB — RESPIRATORY PANEL BY PCR

## 2023-12-09 LAB — COMPREHENSIVE METABOLIC PANEL WITH GFR
ALT: 27 U/L (ref 0–44)
AST: 27 U/L (ref 15–41)
Albumin: 3.2 g/dL — ABNORMAL LOW (ref 3.5–5.0)
Alkaline Phosphatase: 97 U/L (ref 38–126)
Anion gap: 12 (ref 5–15)
BUN: 26 mg/dL — ABNORMAL HIGH (ref 8–23)
CO2: 22 mmol/L (ref 22–32)
Calcium: 8.7 mg/dL — ABNORMAL LOW (ref 8.9–10.3)
Chloride: 101 mmol/L (ref 98–111)
Creatinine, Ser: 1.29 mg/dL — ABNORMAL HIGH (ref 0.44–1.00)
GFR, Estimated: 45 mL/min — ABNORMAL LOW (ref >60)
Glucose, Bld: 204 mg/dL — ABNORMAL HIGH (ref 70–99)
Potassium: 4.1 mmol/L (ref 3.5–5.1)
Sodium: 135 mmol/L (ref 135–145)
Total Bilirubin: 0.8 mg/dL (ref 0.0–1.2)
Total Protein: 6 g/dL — ABNORMAL LOW (ref 6.5–8.1)

## 2023-12-09 LAB — T4, FREE: Free T4: 1.17 ng/dL — ABNORMAL HIGH (ref 0.61–1.12)

## 2023-12-09 LAB — CBC
HCT: 34.9 % — ABNORMAL LOW (ref 36.0–46.0)
Hemoglobin: 11.1 g/dL — ABNORMAL LOW (ref 12.0–15.0)
MCH: 30.9 pg (ref 26.0–34.0)
MCHC: 31.8 g/dL (ref 30.0–36.0)
MCV: 97.2 fL (ref 80.0–100.0)
Platelets: 173 K/uL (ref 150–400)
RBC: 3.59 MIL/uL — ABNORMAL LOW (ref 3.87–5.11)
RDW: 14.7 % (ref 11.5–15.5)
WBC: 7.8 K/uL (ref 4.0–10.5)
nRBC: 0 % (ref 0.0–0.2)

## 2023-12-09 LAB — SARS CORONAVIRUS 2 BY RT PCR: SARS Coronavirus 2 by RT PCR: NEGATIVE

## 2023-12-09 LAB — TSH: TSH: 1.646 u[IU]/mL (ref 0.350–4.500)

## 2023-12-09 MED ORDER — LEVALBUTEROL HCL 0.63 MG/3ML IN NEBU
0.6300 mg | INHALATION_SOLUTION | Freq: Four times a day (QID) | RESPIRATORY_TRACT | Status: DC
  Administered 2023-12-09 (×2): 0.63 mg via RESPIRATORY_TRACT
  Filled 2023-12-09 (×2): qty 3

## 2023-12-09 MED ORDER — BUDESONIDE 0.5 MG/2ML IN SUSP
0.5000 mg | Freq: Two times a day (BID) | RESPIRATORY_TRACT | Status: DC
  Administered 2023-12-09 – 2023-12-11 (×5): 0.5 mg via RESPIRATORY_TRACT
  Filled 2023-12-09 (×5): qty 2

## 2023-12-09 MED ORDER — BUPROPION HCL ER (XL) 150 MG PO TB24
300.0000 mg | ORAL_TABLET | Freq: Every day | ORAL | Status: DC
  Administered 2023-12-09 – 2023-12-13 (×5): 300 mg via ORAL
  Filled 2023-12-09 (×5): qty 2

## 2023-12-09 MED ORDER — ENOXAPARIN SODIUM 40 MG/0.4ML IJ SOSY
40.0000 mg | PREFILLED_SYRINGE | INTRAMUSCULAR | Status: DC
  Administered 2023-12-09: 40 mg via SUBCUTANEOUS
  Filled 2023-12-09: qty 0.4

## 2023-12-09 MED ORDER — POTASSIUM CHLORIDE CRYS ER 10 MEQ PO TBCR: 10.0000 meq | EXTENDED_RELEASE_TABLET | Freq: Every day | ORAL | Status: DC | PRN

## 2023-12-09 MED ORDER — METHYLPREDNISOLONE SODIUM SUCC 125 MG IJ SOLR
80.0000 mg | Freq: Once | INTRAMUSCULAR | Status: AC
  Administered 2023-12-09: 80 mg via INTRAVENOUS
  Filled 2023-12-09: qty 2

## 2023-12-09 MED ORDER — METOPROLOL SUCCINATE ER 25 MG PO TB24
25.0000 mg | ORAL_TABLET | Freq: Every day | ORAL | Status: DC
  Administered 2023-12-09 – 2023-12-12 (×5): 25 mg via ORAL
  Filled 2023-12-09 (×5): qty 1

## 2023-12-09 MED ORDER — BENZONATATE 100 MG PO CAPS
100.0000 mg | ORAL_CAPSULE | Freq: Three times a day (TID) | ORAL | Status: DC
  Administered 2023-12-09 – 2023-12-13 (×14): 100 mg via ORAL
  Filled 2023-12-09 (×14): qty 1

## 2023-12-09 MED ORDER — ACETAMINOPHEN 325 MG PO TABS: 650.0000 mg | ORAL_TABLET | Freq: Four times a day (QID) | ORAL | Status: DC | PRN

## 2023-12-09 MED ORDER — TIZANIDINE HCL 4 MG PO TABS: 4.0000 mg | ORAL_TABLET | Freq: Every day | ORAL | Status: DC | PRN

## 2023-12-09 MED ORDER — IPRATROPIUM BROMIDE 0.02 % IN SOLN: 0.5000 mg | RESPIRATORY_TRACT | Status: DC | PRN

## 2023-12-09 MED ORDER — ONDANSETRON HCL 4 MG/2ML IJ SOLN: 4.0000 mg | Freq: Four times a day (QID) | INTRAMUSCULAR | Status: DC | PRN

## 2023-12-09 MED ORDER — ACETAMINOPHEN 650 MG RE SUPP: 650.0000 mg | Freq: Four times a day (QID) | RECTAL | Status: DC | PRN

## 2023-12-09 MED ORDER — SODIUM CHLORIDE 0.9% FLUSH
3.0000 mL | Freq: Two times a day (BID) | INTRAVENOUS | Status: DC
  Administered 2023-12-09 – 2023-12-13 (×8): 3 mL via INTRAVENOUS

## 2023-12-09 MED ORDER — LEVOTHYROXINE SODIUM 88 MCG PO TABS
88.0000 ug | ORAL_TABLET | Freq: Every day | ORAL | Status: DC
  Administered 2023-12-09 – 2023-12-13 (×5): 88 ug via ORAL
  Filled 2023-12-09 (×5): qty 1

## 2023-12-09 MED ORDER — ONDANSETRON HCL 4 MG PO TABS: 4.0000 mg | ORAL_TABLET | Freq: Four times a day (QID) | ORAL | Status: DC | PRN

## 2023-12-09 MED ORDER — REVEFENACIN 175 MCG/3ML IN SOLN
175.0000 ug | Freq: Every day | RESPIRATORY_TRACT | Status: DC
  Administered 2023-12-09 – 2023-12-11 (×3): 175 ug via RESPIRATORY_TRACT
  Filled 2023-12-09 (×3): qty 3

## 2023-12-09 MED ORDER — OYSTER SHELL CALCIUM/D3 500-5 MG-MCG PO TABS
2.0000 | ORAL_TABLET | Freq: Every day | ORAL | Status: DC
  Administered 2023-12-09 – 2023-12-12 (×4): 2 via ORAL
  Filled 2023-12-09 (×4): qty 2

## 2023-12-09 MED ORDER — METOPROLOL TARTRATE 5 MG/5ML IV SOLN: 2.5000 mg | Freq: Four times a day (QID) | INTRAVENOUS | Status: DC | PRN

## 2023-12-09 MED ORDER — CARBIDOPA-LEVODOPA 25-100 MG PO TABS
1.0000 | ORAL_TABLET | Freq: Three times a day (TID) | ORAL | Status: DC
  Administered 2023-12-09 – 2023-12-13 (×13): 1 via ORAL
  Filled 2023-12-09 (×13): qty 1

## 2023-12-09 MED ORDER — SODIUM CHLORIDE 0.9 % IV SOLN
250.0000 mL | INTRAVENOUS | Status: AC | PRN
  Administered 2023-12-09: 250 mL via INTRAVENOUS

## 2023-12-09 MED ORDER — IPRATROPIUM BROMIDE 0.02 % IN SOLN
0.5000 mg | Freq: Four times a day (QID) | RESPIRATORY_TRACT | Status: DC
  Administered 2023-12-09: 0.5 mg via RESPIRATORY_TRACT
  Filled 2023-12-09 (×2): qty 2.5

## 2023-12-09 MED ORDER — CEFAZOLIN SODIUM-DEXTROSE 2-4 GM/100ML-% IV SOLN
2.0000 g | INTRAVENOUS | Status: AC
  Administered 2023-12-10: 2 g via INTRAVENOUS
  Filled 2023-12-09: qty 100

## 2023-12-09 MED ORDER — SODIUM CHLORIDE 0.9% FLUSH
3.0000 mL | Freq: Two times a day (BID) | INTRAVENOUS | Status: DC
  Administered 2023-12-09 – 2023-12-12 (×5): 3 mL via INTRAVENOUS

## 2023-12-09 MED ORDER — METHYLPREDNISOLONE SODIUM SUCC 125 MG IJ SOLR
80.0000 mg | INTRAMUSCULAR | Status: AC
  Administered 2023-12-09 – 2023-12-12 (×4): 80 mg via INTRAVENOUS
  Filled 2023-12-09 (×4): qty 2

## 2023-12-09 MED ORDER — SODIUM CHLORIDE 0.9 % IV SOLN
500.0000 mg | INTRAVENOUS | Status: AC
  Administered 2023-12-09 – 2023-12-11 (×3): 500 mg via INTRAVENOUS
  Filled 2023-12-09 (×3): qty 5

## 2023-12-09 MED ORDER — LEVALBUTEROL HCL 0.63 MG/3ML IN NEBU: 0.6300 mg | INHALATION_SOLUTION | RESPIRATORY_TRACT | Status: DC | PRN

## 2023-12-09 MED ORDER — ROSUVASTATIN CALCIUM 5 MG PO TABS
10.0000 mg | ORAL_TABLET | Freq: Every day | ORAL | Status: DC
  Administered 2023-12-09 – 2023-12-12 (×5): 10 mg via ORAL
  Filled 2023-12-09 (×5): qty 2

## 2023-12-09 MED ORDER — SODIUM CHLORIDE 0.9% FLUSH: 3.0000 mL | INTRAVENOUS | Status: DC | PRN

## 2023-12-09 MED ORDER — ARFORMOTEROL TARTRATE 15 MCG/2ML IN NEBU
15.0000 ug | INHALATION_SOLUTION | Freq: Two times a day (BID) | RESPIRATORY_TRACT | Status: DC
  Administered 2023-12-09 – 2023-12-11 (×5): 15 ug via RESPIRATORY_TRACT
  Filled 2023-12-09 (×5): qty 2

## 2023-12-09 MED ORDER — HYDROCODONE-ACETAMINOPHEN 5-325 MG PO TABS
1.0000 | ORAL_TABLET | Freq: Four times a day (QID) | ORAL | Status: DC | PRN
  Administered 2023-12-09 – 2023-12-12 (×9): 1 via ORAL
  Filled 2023-12-09 (×9): qty 1

## 2023-12-09 MED ORDER — IPRATROPIUM BROMIDE 0.02 % IN SOLN
0.5000 mg | Freq: Four times a day (QID) | RESPIRATORY_TRACT | Status: DC | PRN
  Administered 2023-12-09: 0.5 mg via RESPIRATORY_TRACT

## 2023-12-09 MED ORDER — CALCIUM-VITAMIN D 600-20 MG-MCG PO TABS: ORAL_TABLET | Freq: Every day | ORAL | Status: DC

## 2023-12-09 MED ORDER — OYSTER SHELL CALCIUM/D3 500-5 MG-MCG PO TABS: 1.0000 | ORAL_TABLET | Freq: Two times a day (BID) | ORAL | Status: DC

## 2023-12-09 NOTE — Progress Notes (Signed)
 PT Cancellation Note  Patient Details Name: EMAGENE MERFELD MRN: [id number] DOB: Nov 05, 1955   Cancelled Treatment:    Reason Eval/Treat Not Completed: Other (comment) (Hold per MD request via secure chat. Plan to follow up tomorrow.)   Leontine Hilt DPT Acute Rehab Services [phone] Prefer contact via chat   Leontine NOVAK Twylia Oka 12/09/2023, 3:01 PM
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
## 2023-12-09 NOTE — Progress Notes (Signed)
 Orthopedic Tech Progress Note Patient Details:  April [name] [birth year] [id number]  Ortho Devices Type of Ortho Device: Lumbar corsett Ortho Device/Splint Location: Back Ortho Device/Splint Interventions: Adjustment, Ordered   Post Interventions Patient Tolerated: Well  [name] 12/09/2023, 5:10 PM

## 2023-12-09 NOTE — Progress Notes (Signed)
 PROGRESS NOTE  April [name] FMW:[id number] DOB: Apr 27, 1955   PCP: Patient, No Pcp Per  Patient is from: Home.  DOA: 12/08/2023 LOS: 0  Chief complaints Chief Complaint  Patient presents with   Shortness of Breath     Brief Narrative / Interim history: 68 year old F with PMH of COPD, chronic hypoxic RF on 2 L as needed, NICM, CKD-3A, HTN, sinus tachycardia, HLD, PMR, depression and anxiety presenting with shortness of breath, wheezing and dry cough, and admitted with COPD exacerbation and lactic acidosis.  Quit smoking about 8 years ago.  In ED, tachycardic to 140s that has improved to 120s.  RR 26 but improved to low 20s.  CT angio chest negative for PE or pneumonia but emphysema and incidental finding of L2 compression fracture with 50% height loss and 2 mm retropulsion.  VBG without significant finding. Cr 1.46.  BUN 26.  Troponin 46.  WBC 11.4.  Started on BiPAP, steroid, Zithromax , scheduled and as needed nebulizers.  Patient came off BiPAP quickly and weaned to room air.  IR consulted for kyphoplasty.   Subjective: Seen and examined earlier this morning.  No major events overnight or this morning.  Continues to endorse shortness of breath, productive cough with clear phlegm and some sore throat.  Denies chest pain.  Reports lower back pain.  Denies numbness, tingling or weakness in legs.  Denies bowel or bladder habit change.   Assessment and plan: COPD with acute exacerbation: Presents with dyspnea, dry cough and wheezing.  Improved.  Currently on room air. Chronic hypoxic respiratory failure: Reports using 2 L by Forrest as needed at baseline. -CT angio chest negative for PE or pneumonia but emphysema. -Continue IV Solu-Medrol , Zithromax , -Start Brovana, Pulmicort  and Yupelri. -Xopenex  and Atrovent  every 2 hours as needed -Check RVP and COVID-19 -Encourage incentive telemetry  L2 vertebral compression fracture: Patient reports recent fall out of a wheelchair.  CT showed L2  compression fracture with 50% height loss and 2 mm retropulsion.  No neurologic deficit in lower extremity.  No bowel or bladder habit change. -IR consulted for kyphoplasty-reportedly approved. -Continue Norco, Tylenol  and Zanaflex  for pain control. -PT/OT eval. -N.p.o.   Chronic HFimpEF: TTE on 7/19 with LVEF of 50 to 55% (40 to 45% in 2019), G1 DD, normal RVSF.  Euvolemic. Essential hypertension: Normotensive. Chronic sinus tachycardia: Worsening tachycardia likely due to nebulizers. -Continue Toprol -XL 25 mg daily.   Elevated troponin: Likely demand ischemia in the setting of tachycardia and respiratory distress.  No chest pain. -Manage COPD and tachycardia as above -New home statin.   Lactic acidosis: Likely due to respiratory distress/COPD.  Resolved.  CKD-3A: Stable - Continue monitoring  Hyperlipidemia -Continue Crestor    Anxiety and depression: Stable -Continue BuSpar   Hypothyroidism: TSH and free T4 within normal. -Continue home Synthroid .   Body mass index is 20.98 kg/m.          DVT prophylaxis:  enoxaparin  (LOVENOX ) injection 40 mg Start: 12/09/23 1000 SCDs Start: 12/09/23 0027 Place TED hose Start: 12/09/23 0027  Code Status: Full code Family Communication: None at bedside Level of care: Telemetry Status is: Inpatient Remains inpatient appropriate because: COPD exacerbation and L2 compression fracture   Final disposition: To be determined   55 minutes with more than 50% spent in reviewing records, counseling patient/family and coordinating care.  Consultants:  Interventional radiology  Procedures: None  Microbiology summarized: Blood cultures NGTD  Objective: Vitals:   12/09/23 0058 12/09/23 0639 12/09/23 0721 12/09/23 0743  BP: 125/74  123/73   Pulse: (!) 121 (!) 103 (!) 101   Resp:  16 18   Temp:  98.7 F (37.1 C)    TempSrc:  Oral    SpO2:  96% 95% 97%  Weight:      Height:        Examination:  GENERAL: No apparent  distress.  Nontoxic. HEENT: MMM.  Vision and hearing grossly intact.  NECK: Supple.  No apparent JVD.  RESP:  No IWOB.  Fair aeration bilaterally. CVS:  RRR. Heart sounds normal.  ABD/GI/GU: BS+. Abd soft, NTND.  MSK/EXT:  Moves extremities. No apparent deformity. No edema.  SKIN: no apparent skin lesion or wound NEURO: AA.  Oriented appropriately.  No apparent focal neuro deficit. PSYCH: Calm. Normal affect.   Sch Meds:  Scheduled Meds:  arformoterol   15 mcg Nebulization BID   benzonatate   100 mg Oral TID   budesonide  (PULMICORT ) nebulizer solution  0.5 mg Nebulization BID   buPROPion   300 mg Oral Daily   Calcium -Vitamin D    Oral Daily   carbidopa -levodopa   1 tablet Oral TID   enoxaparin  (LOVENOX ) injection  40 mg Subcutaneous Q24H   levothyroxine   88 mcg Oral Q0600   methylPREDNISolone  (SOLU-MEDROL ) injection  80 mg Intravenous Q24H   metoprolol  succinate  25 mg Oral QHS   revefenacin   175 mcg Nebulization Daily   rosuvastatin   10 mg Oral QHS   sodium chloride  flush  3 mL Intravenous Q12H   sodium chloride  flush  3 mL Intravenous Q12H   Continuous Infusions:  sodium chloride  250 mL (12/09/23 0735)   azithromycin  Stopped (12/09/23 0306)   PRN Meds:.sodium chloride , acetaminophen  **OR** acetaminophen , HYDROcodone -acetaminophen , levalbuterol  **AND** ipratropium, metoprolol  tartrate, ondansetron  **OR** ondansetron  (ZOFRAN ) IV, potassium chloride , sodium chloride  flush, tiZANidine   Antimicrobials: Anti-infectives (From admission, onward)    Start     Dose/Rate Route Frequency Ordered Stop   12/09/23 0100  azithromycin  (ZITHROMAX ) 500 mg in sodium chloride  0.9 % 250 mL IVPB        500 mg 250 mL/hr over 60 Minutes Intravenous Every 24 hours 12/09/23 0026 12/12/23 0059        I have personally reviewed the following labs and images: CBC: Recent Labs  Lab 12/08/23 1645 12/08/23 1718 12/09/23 0258  WBC 11.4*  --  7.8  HGB 13.0 9.9* 11.1*  HCT 39.9 29.0* 34.9*  MCV 95.0   --  97.2  PLT 194  --  173   BMP &GFR Recent Labs  Lab 12/08/23 1645 12/08/23 1718 12/09/23 0258  NA 139 136 135  K 4.2 3.8 4.1  CL 100  --  101  CO2 23  --  22  GLUCOSE 122*  --  204*  BUN 26*  --  26*  CREATININE 1.46*  --  1.29*  CALCIUM  9.4  --  8.7*   Estimated Creatinine Clearance: 37.8 mL/min (A) (by C-G formula based on SCr of 1.29 mg/dL (H)). Liver & Pancreas: Recent Labs  Lab 12/09/23 0258  AST 27  ALT 27  ALKPHOS 97  BILITOT 0.8  PROT 6.0*  ALBUMIN 3.2*   No results for input(s): LIPASE, AMYLASE in the last 168 hours. No results for input(s): AMMONIA in the last 168 hours. Diabetic: No results for input(s): HGBA1C in the last 72 hours. No results for input(s): GLUCAP in the last 168 hours. Cardiac Enzymes: No results for input(s): CKTOTAL, CKMB, CKMBINDEX, TROPONINI in the last 168 hours. Recent Labs    07/22/23 1811  PROBNP 198.0  Coagulation Profile: No results for input(s): INR, PROTIME in the last 168 hours. Thyroid  Function Tests: Recent Labs    12/09/23 0258  TSH 1.646  FREET4 1.17*   Lipid Profile: No results for input(s): CHOL, HDL, LDLCALC, TRIG, CHOLHDL, LDLDIRECT in the last 72 hours. Anemia Panel: No results for input(s): VITAMINB12, FOLATE, FERRITIN, TIBC, IRON, RETICCTPCT in the last 72 hours. Urine analysis:    Component Value Date/Time   COLORURINE YELLOW 09/30/2023 0400   APPEARANCEUR HAZY (A) 09/30/2023 0400   LABSPEC 1.014 09/30/2023 0400   PHURINE 5.0 09/30/2023 0400   GLUCOSEU NEGATIVE 09/30/2023 0400   HGBUR NEGATIVE 09/30/2023 0400   BILIRUBINUR NEGATIVE 09/30/2023 0400   KETONESUR NEGATIVE 09/30/2023 0400   PROTEINUR NEGATIVE 09/30/2023 0400   UROBILINOGEN 1.0 09/29/2012 1247   NITRITE NEGATIVE 09/30/2023 0400   LEUKOCYTESUR NEGATIVE 09/30/2023 0400   Sepsis Labs: Invalid input(s): PROCALCITONIN, LACTICIDVEN  Microbiology: Recent Results (from the past 240  hours)  Culture, blood (routine x 2)     Status: None (Preliminary result)   Collection Time: 12/08/23  4:55 PM   Specimen: BLOOD  Result Value Ref Range Status   Specimen Description BLOOD RIGHT ANTECUBITAL  Final   Special Requests   Final    BOTTLES DRAWN AEROBIC AND ANAEROBIC Blood Culture results may not be optimal due to an inadequate volume of blood received in culture bottles   Culture   Final    NO GROWTH < 24 HOURS Performed at [facility], [address] [place], [place] [address]    Report Status PENDING  Incomplete  Culture, blood (routine x 2)     Status: None (Preliminary result)   Collection Time: 12/08/23  5:02 PM   Specimen: BLOOD LEFT FOREARM  Result Value Ref Range Status   Specimen Description BLOOD LEFT FOREARM  Final   Special Requests   Final    BOTTLES DRAWN AEROBIC AND ANAEROBIC Blood Culture results may not be optimal due to an inadequate volume of blood received in culture bottles   Culture   Final    NO GROWTH < 24 HOURS Performed at [facility], [address] [place], [place] [address]    Report Status PENDING  Incomplete    Radiology Studies: CT Lumbar Spine Wo Contrast Result Date: 12/09/2023 EXAM: CT OF THE LUMBAR SPINE WITHOUT CONTRAST 12/09/2023 12:59:03 AM TECHNIQUE: CT of the lumbar spine was performed without the administration of intravenous contrast. Multiplanar reformatted images are provided for review. Automated exposure control, iterative reconstruction, and/or weight based adjustment of the mA/kV was utilized to reduce the radiation dose to as low as reasonably achievable. COMPARISON: 12/17/2023 CLINICAL HISTORY: Back trauma, no prior imaging (Age >= 16y) FINDINGS: BONES AND ALIGNMENT: There is a new incomplete burst fracture of L2 with approximately 50% central height loss and 2 mm of retropulsion. Other visualized vertebral body heights appear maintained. Alignment is normal except for the retropulsion at L2. No suspicious  bone lesion. DEGENERATIVE CHANGES: No significant degenerative changes. SOFT TISSUES: Calcific aortic atherosclerosis. IMPRESSION: 1. New incomplete burst fracture of L2 with approximately 50% central height loss and 2 mm of retropulsion. Electronically signed by: Franky Stanford MD 12/09/2023 01:09 AM EST RP Workstation: [id number]   CT Angio Chest PE W and/or Wo Contrast Result Date: 12/08/2023 EXAM: CTA of the Chest with and without contrast for PE 12/08/2023 10:04:24 PM TECHNIQUE: CTA of the chest was performed after the administration of 75 mL of iohexol  (OMNIPAQUE ) 350 MG/ML injection.  Multiplanar reformatted images are provided for review. MIP images are provided for review. Automated exposure control, iterative reconstruction, and/or weight based adjustment of the mA/kV was utilized to reduce the radiation dose to as low as reasonably achievable. COMPARISON: Remote prior examination 12/14/2022. CLINICAL HISTORY: Pulmonary embolism (PE) suspected, high prob. FINDINGS: PULMONARY ARTERIES: Pulmonary arteries are adequately opacified for evaluation. No pulmonary embolism. Main pulmonary artery is normal in caliber. MEDIASTINUM: The heart and pericardium demonstrate no acute abnormality. Mild atherosclerotic calcification within the thoracic aorta. LYMPH NODES: No mediastinal, hilar or axillary lymphadenopathy. LUNGS AND PLEURA: Severe emphysema. Motion artifact slightly limits evaluation of the pulmonary parenchyma. 5 mm noncalcified pulmonary nodule within the right apex (23/6) stable since remote prior examination 12/14/2022. No pneumothorax or pleural effusion. UPPER ABDOMEN: Status post cholecystectomy. Limited images of the upper abdomen are otherwise unremarkable. SOFT TISSUES AND BONES: Segmentation anomaly with incomplete segmentation T12-L1. Superior endplate fracture L2 appears acute, and appears new from, it is incompletely included on this examination and not well assessed. IMPRESSION: 1. No  pulmonary embolism. 2. Suspected acute superior endplate fracture of L2, new from prior examination, incompletely included and not well assessed. Correlation for point tenderness and possible dedicated CT imaging of the lumbar spine is recommended for further evaluation. 3. Severe emphysema. Electronically signed by: Dorethia Molt MD 12/08/2023 11:23 PM EST RP Workstation: [id number]   DG Chest Portable 1 View Result Date: 12/08/2023 EXAM: 1 VIEW(S) XRAY OF THE CHEST 12/08/2023 04:59:10 PM COMPARISON: 09/29/2023 CLINICAL HISTORY: COPD/SHOB FINDINGS: LUNGS AND PLEURA: Stable hyperinflation of the lungs. No focal pulmonary opacity. No pleural effusion. No pneumothorax. HEART AND MEDIASTINUM: No acute abnormality of the cardiac and mediastinal silhouettes. BONES AND SOFT TISSUES: Status post left shoulder arthroplasty. No acute osseous abnormality. IMPRESSION: 1. Stable hyperinflation of the lungs, compatible with COPD. Electronically signed by: Lynwood Seip MD 12/08/2023 05:42 PM EST RP Workstation: [id number]      Ujbz T. Jaz [facility]  If 7PM-7AM, please contact night-coverage [email or web address] 12/09/2023, 10:53 AM

## 2023-12-09 NOTE — TOC Initial Note (Addendum)
 Transition of Care [facility]) - Initial/Assessment Note    Patient Details  Name: April [name] MRN: [id number] Date of Birth: 1955/04/08  [facility] CM/SW Contact:    Waddell Barnie Rama, RN Phone Number: 12/09/2023, 1:36 PM  Clinical Narrative:                 From home alone, from [facility] IDL, has no PCP and  has insurance on file, states has no HH services in place at this time  but does OP PT and OP ST, and OP OT , has walker, cane and w/chair at home.  States she will need a cab because her son's truck is too high for her to step up in or may need a cab at [employer], Son  is support system , but he works a lot,  states gets medications from from [employer] [facility].  Pta w/chair most of the time.   She will need to get transportation set up to go to MD apts also.   Patient states if she needs any HHPT, HHOT she will do it with [facility] at her facility.  Await PT/OT eval.  NCM set up apt with [facility] on 12/12 and they will pick her up at 12:40 for the 1 pm new patient apt.  Expected Discharge Plan: Home w Home Health Services Barriers to Discharge: Continued Medical Work up   Patient Goals and CMS Choice Patient states their goals for this hospitalization and ongoing recovery are:: return to IDL at [facility]   Choice offered to / list presented to : NA      Expected Discharge Plan and Services   Discharge Planning Services: CM Consult Post Acute Care Choice: NA Living arrangements for the past 2 months: Independent Living Facility                   DME Agency: NA       HH Arranged: NA          Prior Living Arrangements/Services Living arrangements for the past 2 months: Independent Living Facility Lives with:: Self Patient language and need for interpreter reviewed:: Yes Do you feel safe going back to the place where you live?: Yes      Need for Family Participation in Patient Care: Yes (Comment) Care giver support system in place?: Yes  (comment) (aide 8:30 - 12:30, and 4pm to 8 pm) Current home services: DME (walker, cane, w/chair) Criminal Activity/Legal Involvement Pertinent to Current Situation/Hospitalization: No - Comment as needed  Activities of Daily Living   ADL Screening (condition at time of admission) Independently performs ADLs?: No Does the patient have a NEW difficulty with bathing/dressing/toileting/self-feeding that is expected to last >3 days?: No Does the patient have a NEW difficulty with getting in/out of bed, walking, or climbing stairs that is expected to last >3 days?: No Does the patient have a NEW difficulty with communication that is expected to last >3 days?: No Is the patient deaf or have difficulty hearing?: No Does the patient have difficulty seeing, even when wearing glasses/contacts?: No Does the patient have difficulty concentrating, remembering, or making decisions?: No  Permission Sought/Granted Permission sought to share information with : Case Manager Permission granted to share information with : Yes, Verbal Permission Granted     Permission granted to share info w AGENCY: HH- [facility], [facility] personal aide        Emotional Assessment Appearance:: Appears stated age Attitude/Demeanor/Rapport: Engaged Affect (typically observed): Appropriate Orientation: :  Oriented to Self, Oriented to Place, Oriented to  Time, Oriented to Situation Alcohol  / Substance Use: Not Applicable Psych Involvement: No (comment)  Admission diagnosis:  COPD exacerbation (HCC) [J44.1] COPD with acute exacerbation (HCC) [J44.1] Patient Active Problem List   Diagnosis Date Noted   Lumbar vertebral fracture (HCC) 12/09/2023   Chronic hypoxic respiratory failure (HCC) 12/09/2023   Generalized anxiety disorder 12/09/2023   Lactic acidosis-secondary to respiratory distress 12/09/2023   COPD exacerbation (HCC) 10/01/2023   AKI (acute kidney injury) 09/30/2023   Non-healing wound of lower extremity  09/30/2023   COPD with acute exacerbation (HCC) 09/29/2023   Pelvic fracture (HCC) 08/17/2023   Acute exacerbation of COPD with asthma (HCC) 07/24/2023   Acute exacerbation of chronic obstructive pulmonary disease (COPD) (HCC) 07/23/2023   Atrophy of skin due to systemic corticosteroid 04/08/2023   Parkinson's disease with dyskinesia and fluctuating manifestations (HCC) 04/08/2023   Bilateral lower extremity edema 05/27/2022   Frequent falls 03/05/2022   Serrated polyp of colon 03/05/2022   Hyperlipidemia 12/03/2021   Unsteady gait 07/20/2021   Chronic diastolic CHF (congestive heart failure) (HCC) 06/13/2021   Upper airway cough syndrome 05/07/2021   Osteoporosis 03/23/2021   Allergic rhinitis 04/06/2019   Aortic atherosclerosis 12/17/2017   Pulmonary nodule 12/17/2017   GERD (gastroesophageal reflux disease) 12/12/2017   COPD, severe (HCC) 11/24/2017   CKD (chronic kidney disease) stage 3, GFR 30-59 ml/min (HCC) 10/28/2017   Vertigo 10/27/2017   Essential hypertension 06/23/2017   Polymyalgia rheumatica 05/22/2016   History of bilateral hip replacements 05/22/2016   DDD (degenerative disc disease), lumbar 05/22/2016   History of avascular necrosis of capital femoral epiphysis 08/14/2014   Asthma 03/23/2010   Acquired hypothyroidism 03/21/2010   Chronic recurrent major depressive disorder 03/21/2010   Former smoker 03/21/2010   History of temporal arteritis 03/21/2010   PCP:  Patient, No Pcp Per Pharmacy:   [facility] - [place], [place] - [address] [place] [address] Phone: [phone] Fax: [phone]     Social Drivers of Health (SDOH) Social History: SDOH Screenings   Food Insecurity: No Food Insecurity (12/09/2023)  Housing: Low Risk  (12/09/2023)  Recent Concern: Housing - High Risk (09/30/2023)  Transportation Needs: Unmet Transportation Needs (12/09/2023)  Utilities: Not At Risk (12/09/2023)  Alcohol  Screen: Low Risk   (11/26/2021)  Depression (PHQ2-9): Low Risk  (04/08/2023)  Financial Resource Strain: Medium Risk (12/01/2022)  Physical Activity: Insufficiently Active (12/01/2022)  Social Connections: Moderately Isolated (12/09/2023)  Stress: No Stress Concern Present (12/01/2022)  Tobacco Use: Medium Risk (12/09/2023)  Health Literacy: Adequate Health Literacy (12/01/2022)   SDOH Interventions:     Readmission Risk Interventions    12/09/2023    1:31 PM  Readmission Risk Prevention Plan  Transportation Screening Complete  HRI or Home Care Consult Complete  Palliative Care Screening Not Applicable  Medication Review (RN Care Manager) Complete

## 2023-12-09 NOTE — Progress Notes (Signed)
 Orthopedic Tech Progress Note Patient Details:  April [name] 10-Nov-1955 [id number] Secured chatted the MD for clarification about status of the patient whether they're being admitted or discharged. She was able to verify the patient will be admitted to a floor. We will drop off the brace once she is settled into the room.  Patient ID: April [name], female   DOB: 06-30-1955, 68 y.o.   MRN: [id number]  [name] 12/09/2023, 2:04 AM

## 2023-12-09 NOTE — H&P (Addendum)
 History and Physical    April [name] FMW:[id number] DOB: [birth year] DOA: 12/08/2023  PCP: Patient, No Pcp Per   Patient coming from: ALF   Chief Complaint:  Chief Complaint  Patient presents with   Shortness of Breath   ED TRIAGE note: Pt to er via ems from [facility] green, pt states that she is here for sob, pt has expiratory wheezes, per ems they gave 125 of solumedrol and 7.5 of albuterol .  Pt in bed, pt has expiratory wheezes and accessory muscle use.       HPI:  April [name] is a 68 y.o. female with medical history significant of COPD, chronic hypoxic respiratory failure 4 L oxygen  at baseline, nonischemic cardiomyopathy, essential hypertension, hyperlipidemia, CKD 3A, hypothyroidism, polymyalgia rheumatica, generalized anxiety disorder and depression presented to emergency department complaining of shortness of breath.  And route to ED via EMS patient received Solu-Medrol  125 mg and DuoNeb breathing treatment.  Patient is complaining about nonproductive dry cough with associated shortness of breath and wheezing.  Denies any fever and chill.  Denies any nausea vomiting.  Complaining about back pain secondary to fall few days ago.  Denies any loss of consciousness, headache, dizziness, tremor, sensory change, focal weakness and generalized weakness.   ED Course:  At presentation to ED patient found tachycardic heart rate 145 which improved to 22.  Initially patient required BiPAP however with breathing treatment eventually has been transition to nasal cannula oxygen /room air currently O2 sat 100%.  EKG showing sinus tachycardia with heart rate 136.  Lab work, CBC showing leukocytosis 11.4 otherwise unremarkable.  BMP unremarkable. Initial lactic acid 7 improved to 3 with LR bolus and BiPAP. Flat troponin. Due to elevated lactic acid blood cultures and UA has been obtained. VBG unremarkable except low bicarb 19.  CTA chest no evidence of pulmonary embolism.  No evidence  of pneumonia.  Severe emphysema. CT chest showed suspected acute superior endplate L2 fracture. Chest x-ray showing stable hyperinflated lung compatible of COPD.  Patient reported that few days ago she slid out of her wheelchair and landed on the ground.  Denies any head injury or loss of consciousness.  In the ED patient received multiple doses of albuterol  nebulizer, Atrovent , DuoNeb, 1 L of LR bolus and Percocet.   Hospitalist consulted for further evaluation management of COPD exacerbation, respiratory distress and L2 vertebral endplate fracture.   Significant labs in the ED: Lab Orders         Culture, blood (routine x 2)         Respiratory (~20 pathogens) panel by PCR         CBC         Basic metabolic panel         Lactic acid, plasma         Urinalysis, Routine w reflex microscopic -Urine, Clean Catch         Comprehensive metabolic panel         CBC         TSH         T4, free         I-Stat venous blood gas, ([facility] ED, [facility], [facility])         I-Stat CG4 Lactic Acid       Review of Systems:  Review of Systems  Constitutional:  Negative for chills, fever, malaise/fatigue and weight loss.  Respiratory:  Positive for cough, shortness of breath and wheezing. Negative for sputum production.   Cardiovascular:  Negative for chest pain, palpitations and leg swelling.  Gastrointestinal:  Negative for abdominal pain, heartburn, nausea and vomiting.  Musculoskeletal:  Positive for back pain and falls. Negative for joint pain, myalgias and neck pain.  Neurological:  Negative for dizziness, tingling, tremors, sensory change, speech change, focal weakness, seizures, loss of consciousness, weakness and headaches.  Psychiatric/Behavioral:  The patient is not nervous/anxious.     Past Medical History:  Diagnosis Date   Allergy    Anemia    Anxiety    Anxiety and depression    related to caring for mother during terminal illness   Arthritis    Asthma    AVN (avascular necrosis of  bone) (HCC)    hip and wrist   Bronchitis    hx of   Cataract    Chronic kidney disease    COPD (chronic obstructive pulmonary disease) (HCC)    Depression    Emphysema of lung (HCC)    Endometriosis    FH: CAD (coronary artery disease)    GERD (gastroesophageal reflux disease)    ocassional   History of blood transfusion    after hip repackment - broke out in hives and started itching   History of palpitations    Hyperlipidemia    Hypertension    Hypothyroid    Neuromuscular disorder (HCC)    Non-ischemic cardiomyopathy (HCC) 2019   Osteopenia    forteo through Dr. Dolphus (started 10/12)   Osteoporosis    PMR (polymyalgia rheumatica)    SIRS (systemic inflammatory response syndrome) (HCC) 10/2017   Smoker    SOB (shortness of breath) on exertion    Squamous cell carcinoma    facial, 2016   Temporal arteritis (HCC)    s/p prednisone  taper    Past Surgical History:  Procedure Laterality Date   ABDOMINAL ADHESION SURGERY     ABDOMINAL HYSTERECTOMY     BREAST EXCISIONAL BIOPSY Left    BREAST REDUCTION SURGERY Bilateral 06/13/2019   Procedure: MAMMARY REDUCTION  (BREAST);  Surgeon: Elisabeth Craig RAMAN, MD;  Location: [facility] OR;  Service: Plastics;  Laterality: Bilateral;   BREAST SURGERY Left    benign bx 1990   CARDIAC CATHETERIZATION  2007   no PCI   CATARACT EXTRACTION Bilateral    CHOLECYSTECTOMY     COLONOSCOPY     COLONOSCOPY W/ POLYPECTOMY     INCONTINENCE SURGERY     2007    JOINT REPLACEMENT Left 2012   left hip   REDUCTION MAMMAPLASTY     2021   REVERSE SHOULDER ARTHROPLASTY Left 05/28/2020   Procedure: REVERSE SHOULDER ARTHROPLASTY;  Surgeon: Sharl Selinda Dover, MD;  Location: [facility] OR;  Service: Orthopedics;  Laterality: Left;  2.5 hrs   TONSILLECTOMY     TOTAL HIP ARTHROPLASTY Right 10/04/2012   Procedure: TOTAL HIP ARTHROPLASTY;  Surgeon: Maude LELON Right, MD;  Location: [facility] OR;  Service: Orthopedics;  Laterality: Right;   TOTAL SHOULDER REPLACEMENT  Left    WRIST SURGERY Left    2012/ left wrist/ bone removed due to necrosis     reports that she quit smoking about 6 years ago. Her smoking use included cigarettes. She started smoking about 40 years ago. She has a 11.2 pack-year smoking history. She has been exposed to tobacco smoke. She has never used smokeless tobacco. She reports current alcohol  use. She reports that she does not use drugs.  Allergies  Allergen Reactions   Sulfa Antibiotics Anaphylaxis, Hives and Dermatitis   Sulfonamide Derivatives Anaphylaxis,  Swelling, Dermatitis, Rash and Other (See Comments)    Throat closes   Dilaudid  [Hydromorphone  Hcl] Nausea And Vomiting   Fosamax [Alendronate Sodium] Other (See Comments)    GI intolerence   Molds & Smuts Itching and Other (See Comments)    Sinus and eye irritation   Pollen Extract Itching and Other (See Comments)    Sinus and eye irritation   Sertraline      Made me crazy   Wound Dressing Adhesive Other (See Comments)    Redness Skin tears easily  OK to use paper tape    Family History  Problem Relation Age of Onset   Heart disease Mother    Hypertension Mother    Alzheimer's disease Mother    Colon cancer Mother    Kidney failure Mother    Arthritis Brother    Suicidality Brother    Colon polyps Brother    Breast cancer Maternal Aunt    Healthy Son    Esophageal cancer Neg Hx    Stomach cancer Neg Hx    Rectal cancer Neg Hx    Crohn's disease Neg Hx     Prior to Admission medications   Medication Sig Start Date End Date Taking? Authorizing Provider  acetaminophen  (TYLENOL ) 500 MG tablet Take 1,000 mg by mouth 2 (two) times daily as needed for headache or fever (pain).   Yes [provider]  albuterol  (VENTOLIN  HFA) 108 (90 Base) MCG/ACT inhaler Inhale 2 puffs into the lungs every 6 (six) hours as needed for wheezing or shortness of breath. 10/06/23  Yes [name], NP  budesonide -glycopyrrolate -formoterol  (BREZTRI  AEROSPHERE)  160-9-4.8 MCG/ACT AERO inhaler Inhale 2 puffs into the lungs every 12 (twelve) hours. 10/06/23  Yes [name], NP  buPROPion  (WELLBUTRIN  XL) 300 MG 24 hr tablet Take 300 mg by mouth daily.   Yes [provider]  Calcium  Carb-Cholecalciferol  (CALCIUM -VITAMIN D  PO) Take 1 tablet by mouth daily.   Yes [provider]  carbidopa -levodopa  (SINEMET  IR) 25-100 MG tablet TAKE 1 TABLET BY MOUTH 3 TIMES A DAY 08/03/23  Yes Tat, Rebecca S, DO  Cyanocobalamin  (VITAMIN B 12 PO) Take 1 tablet by mouth at bedtime.   Yes [provider]  furosemide  (LASIX ) 20 MG tablet Take 20 mg by mouth daily as needed for fluid.   Yes [provider]  HYDROcodone -acetaminophen  (NORCO/VICODIN) 5-325 MG tablet Take 1-2 tablets by mouth every 6 (six) hours as needed. 11/18/23  Yes [name], MD  ipratropium-albuterol  (DUONEB) 0.5-2.5 (3) MG/3ML SOLN Take 3 mLs by nebulization every 6 (six) hours as needed (shortness of breath).   Yes [provider]  levothyroxine  (SYNTHROID ) 88 MCG tablet Take 88 mcg by mouth daily before breakfast.   Yes [provider]  Mepolizumab  (NUCALA ) 100 MG/ML SOAJ Inject 1 mL (100 mg total) into the skin every 28 (twenty-eight) days. 10/06/23  Yes Jude Harden GAILS, MD  metoprolol  succinate (TOPROL -XL) 25 MG 24 hr tablet Take 25 mg by mouth at bedtime.   Yes [provider]  potassium chloride  (KLOR-CON  M) 10 MEQ tablet Take 1 tablet (10 mEq total) by mouth daily as needed (when you take the lasix ). 03/01/23  Yes [name], MD  rosuvastatin  (CRESTOR ) 10 MG tablet Take 1 tablet (10 mg total) by mouth at bedtime. 03/01/23  Yes [name], MD  SYSTANE ULTRA 0.4-0.3 % SOLN Place 1 drop into both eyes 2 (two) times daily. 09/30/23  Yes [name], [name], DO  tiZANidine  (ZANAFLEX ) 4 MG tablet  Take 4 mg by mouth daily as needed (severe muscle spasms).   Yes [provider]  valsartan  (DIOVAN ) 40 MG tablet Take 40  mg by mouth daily.   Yes [provider]  azithromycin  (ZITHROMAX ) 250 MG tablet Take 1 tablet (250 mg total) by mouth every Monday, Wednesday, and Friday. Patient not taking: Reported on 11/17/2023 10/06/23 09/06/24  [name], NP  diclofenac  (VOLTAREN ) 75 MG EC tablet Take 1 tablet (75 mg total) by mouth 2 (two) times daily for 15 days. Patient not taking: Reported on 12/08/2023 12/06/23 12/21/23  Zelaya, Oscar A, PA-C  polyethylene glycol (MIRALAX  / GLYCOLAX ) 17 g packet Take 17 g by mouth daily as needed for moderate constipation. Patient not taking: Reported on 11/17/2023 09/30/23   Benuel Braun, DO     Physical Exam: Vitals:   12/08/23 2345 12/08/23 2352 12/09/23 0000 12/09/23 0058  BP: 120/76  112/69 125/74  Pulse: (!) 123  (!) 122 (!) 121  Resp: 20  19   Temp:  97.8 F (36.6 C)    TempSrc:      SpO2: 94%  96%   Weight:      Height:        Physical Exam Vitals and nursing note reviewed.  Constitutional:      General: She is not in acute distress.    Appearance: She is not ill-appearing.  Cardiovascular:     Rate and Rhythm: Tachycardia present.  Pulmonary:     Effort: No tachypnea, accessory muscle usage or respiratory distress.     Breath sounds: No stridor. Wheezing present. No decreased breath sounds, rhonchi or rales.  Musculoskeletal:     Right lower leg: No edema.     Left lower leg: No edema.  Skin:    Capillary Refill: Capillary refill takes less than 2 seconds.  Neurological:     Mental Status: She is alert and oriented to person, place, and time.  Psychiatric:        Mood and Affect: Mood normal.      Labs on Admission: I have personally reviewed following labs and imaging studies  CBC: Recent Labs  Lab 12/08/23 1645 12/08/23 1718  WBC 11.4*  --   HGB 13.0 9.9*  HCT 39.9 29.0*  MCV 95.0  --   PLT 194  --    Basic Metabolic Panel: Recent Labs  Lab 12/08/23 1645 12/08/23 1718  NA 139 136  K 4.2 3.8  CL 100  --   CO2 23   --   GLUCOSE 122*  --   BUN 26*  --   CREATININE 1.46*  --   CALCIUM  9.4  --    GFR: Estimated Creatinine Clearance: 33.4 mL/min (A) (by C-G formula based on SCr of 1.46 mg/dL (H)). Liver Function Tests: No results for input(s): AST, ALT, ALKPHOS, BILITOT, PROT, ALBUMIN in the last 168 hours. No results for input(s): LIPASE, AMYLASE in the last 168 hours. No results for input(s): AMMONIA in the last 168 hours. Coagulation Profile: No results for input(s): INR, PROTIME in the last 168 hours. Cardiac Enzymes: Recent Labs  Lab 12/08/23 1645 12/08/23 1855  TROPONINIHS 46* 33*   BNP (last 3 results) Recent Labs    07/24/23 0637 09/29/23 1304  BNP 68.4 8.8   HbA1C: No results for input(s): HGBA1C in the last 72 hours. CBG: No results for input(s): GLUCAP in the last 168 hours. Lipid Profile: No results for input(s): CHOL, HDL, LDLCALC, TRIG, CHOLHDL, LDLDIRECT in the  last 72 hours. Thyroid  Function Tests: No results for input(s): TSH, T4TOTAL, FREET4, T3FREE, THYROIDAB in the last 72 hours. Anemia Panel: No results for input(s): VITAMINB12, FOLATE, FERRITIN, TIBC, IRON, RETICCTPCT in the last 72 hours. Urine analysis:    Component Value Date/Time   COLORURINE YELLOW 09/30/2023 0400   APPEARANCEUR HAZY (A) 09/30/2023 0400   LABSPEC 1.014 09/30/2023 0400   PHURINE 5.0 09/30/2023 0400   GLUCOSEU NEGATIVE 09/30/2023 0400   HGBUR NEGATIVE 09/30/2023 0400   BILIRUBINUR NEGATIVE 09/30/2023 0400   KETONESUR NEGATIVE 09/30/2023 0400   PROTEINUR NEGATIVE 09/30/2023 0400   UROBILINOGEN 1.0 09/29/2012 1247   NITRITE NEGATIVE 09/30/2023 0400   LEUKOCYTESUR NEGATIVE 09/30/2023 0400    Radiological Exams on Admission: I have personally reviewed images CT Lumbar Spine Wo Contrast Result Date: 12/09/2023 EXAM: CT OF THE LUMBAR SPINE WITHOUT CONTRAST 12/09/2023 12:59:03 AM TECHNIQUE: CT of the lumbar spine was performed  without the administration of intravenous contrast. Multiplanar reformatted images are provided for review. Automated exposure control, iterative reconstruction, and/or weight based adjustment of the mA/kV was utilized to reduce the radiation dose to as low as reasonably achievable. COMPARISON: 12/17/2023 CLINICAL HISTORY: Back trauma, no prior imaging (Age >= 16y) FINDINGS: BONES AND ALIGNMENT: There is a new incomplete burst fracture of L2 with approximately 50% central height loss and 2 mm of retropulsion. Other visualized vertebral body heights appear maintained. Alignment is normal except for the retropulsion at L2. No suspicious bone lesion. DEGENERATIVE CHANGES: No significant degenerative changes. SOFT TISSUES: Calcific aortic atherosclerosis. IMPRESSION: 1. New incomplete burst fracture of L2 with approximately 50% central height loss and 2 mm of retropulsion. Electronically signed by: Franky Stanford MD 12/09/2023 01:09 AM EST RP Workstation: [id number]   CT Angio Chest PE W and/or Wo Contrast Result Date: 12/08/2023 EXAM: CTA of the Chest with and without contrast for PE 12/08/2023 10:04:24 PM TECHNIQUE: CTA of the chest was performed after the administration of 75 mL of iohexol  (OMNIPAQUE ) 350 MG/ML injection. Multiplanar reformatted images are provided for review. MIP images are provided for review. Automated exposure control, iterative reconstruction, and/or weight based adjustment of the mA/kV was utilized to reduce the radiation dose to as low as reasonably achievable. COMPARISON: Remote prior examination 12/14/2022. CLINICAL HISTORY: Pulmonary embolism (PE) suspected, high prob. FINDINGS: PULMONARY ARTERIES: Pulmonary arteries are adequately opacified for evaluation. No pulmonary embolism. Main pulmonary artery is normal in caliber. MEDIASTINUM: The heart and pericardium demonstrate no acute abnormality. Mild atherosclerotic calcification within the thoracic aorta. LYMPH NODES: No mediastinal, hilar  or axillary lymphadenopathy. LUNGS AND PLEURA: Severe emphysema. Motion artifact slightly limits evaluation of the pulmonary parenchyma. 5 mm noncalcified pulmonary nodule within the right apex (23/6) stable since remote prior examination 12/14/2022. No pneumothorax or pleural effusion. UPPER ABDOMEN: Status post cholecystectomy. Limited images of the upper abdomen are otherwise unremarkable. SOFT TISSUES AND BONES: Segmentation anomaly with incomplete segmentation T12-L1. Superior endplate fracture L2 appears acute, and appears new from, it is incompletely included on this examination and not well assessed. IMPRESSION: 1. No pulmonary embolism. 2. Suspected acute superior endplate fracture of L2, new from prior examination, incompletely included and not well assessed. Correlation for point tenderness and possible dedicated CT imaging of the lumbar spine is recommended for further evaluation. 3. Severe emphysema. Electronically signed by: Dorethia Molt MD 12/08/2023 11:23 PM EST RP Workstation: [id number]   DG Chest Portable 1 View Result Date: 12/08/2023 EXAM: 1 VIEW(S) XRAY OF THE CHEST 12/08/2023 04:59:10 PM COMPARISON: 09/29/2023 CLINICAL HISTORY: COPD/SHOB FINDINGS:  LUNGS AND PLEURA: Stable hyperinflation of the lungs. No focal pulmonary opacity. No pleural effusion. No pneumothorax. HEART AND MEDIASTINUM: No acute abnormality of the cardiac and mediastinal silhouettes. BONES AND SOFT TISSUES: Status post left shoulder arthroplasty. No acute osseous abnormality. IMPRESSION: 1. Stable hyperinflation of the lungs, compatible with COPD. Electronically signed by: Lynwood Seip MD 12/08/2023 05:42 PM EST RP Workstation: [id number]     EKG: My personal interpretation of EKG shows: Sinus tachycardia heart rate 136.    Assessment/Plan: Principal Problem:   COPD with acute exacerbation (HCC) Active Problems:   Polymyalgia rheumatica   Essential hypertension   CKD (chronic kidney disease) stage 3, GFR  30-59 ml/min (HCC)   Hyperlipidemia   Lumbar vertebral fracture (HCC)   Chronic hypoxic respiratory failure (HCC)   Generalized anxiety disorder   Lactic acidosis-secondary to respiratory distress    Assessment and Plan: COPD with acute exacerbation Chronic hypoxic respiratory failure 4 L oxygen  at baseline -Presented to emergency department via EMS with complaining of shortness of breath and cough.  At baseline patient uses 4 L oxygen .  EMS gave Solu-Medrol  125 mg and albuterol  7.5 mg.  At presentation to ED patient found persistently tachycardic, wheezing and due to respiratory distress initially placed on BiPAP eventually with multiple breathing treatment wheezing and shortness of breath has been improved and patient was transition to nasal cannula oxygen /room air.  Currently O2 sat 100% on room air. - Chest x-ray no evidence of pneumonia except emphysema. - CTA chest ruled out PE.  No evidence of pneumonia. -CBC showing leukocytosis 11.1 as patient received Solu-Medrol  before presenting to ED.  BMP unremarkable.  VBG no evidence of respiratory acidosis. - Continue Atrovent  every 6 hour and Xopenex  every 6 hours scheduled.  Continue Atrovent  as needed.  Continue IV Solu-Medrol  40 mg twice daily.  Starting IV azithromycin  500 mg for 3 days. - Checking respiratory panel. - Continue supportive care.  Lactic acidosis secondary to respiratory distress-improving -Initial lactic acid 7.1 which improved to 2 as respiratory distress has been completely resolved.  At this time there is no concern for pneumonia or sepsis related driven elevated lactic acidosis. - Continue monitor development of any fever.  Essential hypertension History of chronic sinus tachycardia -Continue Toprol -XL 25 mg daily.  Elevated troponin - Flat troponin 46 and 33.  EKG showing sinus tachycardia heart rate 136.  Heart rate has been improved up to 120.  History of chronic sinus tachycardia on top of that received high  dose of IV albuterol  which is worsening tachycardia.  CTA chest no evidence of PE. - Elevated troponin in the context of demand ischemia in the context of tachycardia.  Continue to monitor.  L2 vertebral endplate fracture History of disc degenerative disease Recurrent fall Wheelchair-bound at baseline -Patient reported that recent fall from the wheelchair and she has history of chronic back pain.   Patient reported that she probably has still falls over the course of last few weeks during transport from wheelchair to bed.  Being evaluated by neurology concern for development of Parkinson disease?-Per patient. The-CTA chest showed incidental finding of L2 vertebral endplate fracture. - CT lumbar spine showing incomplete branch fracture of L2 approximate 50% central height loss and 2 mm retropulsion. - Complaining for lower back pain with prior tenderness.  Patient does not have any lower extremity weakness and bowel or bladder dysfunction. -Applying back brace.  Patient already on IV steroid for COPD exacerbation. - Consult neurosurgery in the daytime for further evaluation.  Hyperlipidemia -Continue Crestor   CKD stage III - Renal function at baseline.  Continue to monitor  Generalized anxiety disorder History of depression -Continue BuSpar   Hypothyroidism - Continue levothyroxine .  Given patient has history of chronic sinus tachycardia and currently patient is persistently tachycardic also checking TSH and free T4 level.   DVT prophylaxis:  Lovenox  Code Status:  Full Code Diet: Heart healthy diet Family Communication:   Family was present at bedside, at the time of interview. Opportunity was given to ask question and all questions were answered satisfactorily.  Disposition Plan: Continue monitor improvement of COPD exacerbation Consults: Respiratory therapist Admission status:   Inpatient, Telemetry bed  Severity of Illness: The appropriate patient status for this patient is  OBSERVATION. Observation status is judged to be reasonable and necessary in order to provide the required intensity of service to ensure the patient's safety. The patient's presenting symptoms, physical exam findings, and initial radiographic and laboratory data in the context of their medical condition is felt to place them at decreased risk for further clinical deterioration. Furthermore, it is anticipated that the patient will be medically stable for discharge from the hospital within 2 midnights of admission.     Tara Rud, MD [facility]  How to contact the [facility] Attending or Consulting provider 7A - 7P or covering provider during after hours 7P -7A, for this patient.  Check the care team in [facility] and look for a) attending/consulting [facility] provider listed and b) the TR[facility] team listed Log into [email or web address] and use [place]'s universal password to access. If you do not have the password, please contact the hospital operator. Locate the [facility] provider you are looking for under [facility] and page to a number that you can be directly reached. If you still have difficulty reaching the provider, please page the [facility] (Director on Call) for the Hospitalists listed on amion for assistance.  12/09/2023, 1:28 AM

## 2023-12-09 NOTE — Progress Notes (Addendum)
 Patient reports having had two of her personal pillows brought with her via EMS when she was transported here to the hospital. She reports one memory foam pillow and one airplane neck pillow both covered in an aqua pillowcase.  Neither pillow/pillowcase was present when patient arrived to this unit/floor.  Emergency Room contacted and they decline having seen such pillows. Pt made aware.

## 2023-12-10 ENCOUNTER — Inpatient Hospital Stay (HOSPITAL_COMMUNITY)

## 2023-12-10 DIAGNOSIS — J441 Chronic obstructive pulmonary disease with (acute) exacerbation: Secondary | ICD-10-CM | POA: Diagnosis not present

## 2023-12-10 LAB — CBC
HCT: 30.4 % — ABNORMAL LOW (ref 36.0–46.0)
Hemoglobin: 10.1 g/dL — ABNORMAL LOW (ref 12.0–15.0)
MCH: 31.2 pg (ref 26.0–34.0)
MCHC: 33.2 g/dL (ref 30.0–36.0)
MCV: 93.8 fL (ref 80.0–100.0)
Platelets: 169 K/uL (ref 150–400)
RBC: 3.24 MIL/uL — ABNORMAL LOW (ref 3.87–5.11)
RDW: 14.9 % (ref 11.5–15.5)
WBC: 9.9 K/uL (ref 4.0–10.5)
nRBC: 0 % (ref 0.0–0.2)

## 2023-12-10 LAB — PROTIME-INR
INR: 1 (ref 0.8–1.2)
Prothrombin Time: 14.1 s (ref 11.4–15.2)

## 2023-12-10 MED ORDER — FENTANYL CITRATE (PF) 100 MCG/2ML IJ SOLN
INTRAMUSCULAR | Status: AC | PRN
  Administered 2023-12-10 (×4): 25 ug via INTRAVENOUS

## 2023-12-10 MED ORDER — BUPIVACAINE HCL 0.25 % IJ SOLN
30.0000 mL | Freq: Once | INTRAMUSCULAR | Status: AC
  Administered 2023-12-10: 20 mL
  Filled 2023-12-10: qty 30

## 2023-12-10 MED ORDER — FENTANYL CITRATE (PF) 100 MCG/2ML IJ SOLN
INTRAMUSCULAR | Status: AC
  Filled 2023-12-10: qty 2

## 2023-12-10 MED ORDER — LIDOCAINE HCL (PF) 1 % IJ SOLN
30.0000 mL | Freq: Once | INTRAMUSCULAR | Status: AC
  Administered 2023-12-10: 20 mL
  Filled 2023-12-10: qty 30

## 2023-12-10 MED ORDER — IOHEXOL 300 MG/ML  SOLN
50.0000 mL | Freq: Once | INTRAMUSCULAR | Status: AC | PRN
  Administered 2023-12-10: 20 mL

## 2023-12-10 MED ORDER — MIDAZOLAM HCL (PF) 2 MG/2ML IJ SOLN
INTRAMUSCULAR | Status: AC | PRN
  Administered 2023-12-10 (×2): .5 mg via INTRAVENOUS
  Administered 2023-12-10: 1 mg via INTRAVENOUS

## 2023-12-10 MED ORDER — LIDOCAINE HCL (PF) 1 % IJ SOLN
INTRAMUSCULAR | Status: AC
  Filled 2023-12-10: qty 30

## 2023-12-10 MED ORDER — MIDAZOLAM HCL 2 MG/2ML IJ SOLN
INTRAMUSCULAR | Status: AC
  Filled 2023-12-10: qty 2

## 2023-12-10 MED ORDER — BUPIVACAINE HCL (PF) 0.5 % IJ SOLN
INTRAMUSCULAR | Status: AC
  Filled 2023-12-10: qty 30

## 2023-12-10 NOTE — Progress Notes (Signed)
 PROGRESS NOTE  April [name] FMW:[id number] DOB: 1955-07-01 DOA: 12/08/2023 PCP: Patient, No Pcp Per   LOS: 1 day   Brief Narrative / Interim history: 68 year old female with COPD, chronic hypoxemia on 2 L at home as needed, Parkinson's disease, CKD 3A, HTN, PMR, comes into the hospital with shortness of breath, wheezing dry cough, admitted with COPD exacerbation.  She also had a fall prior to admission and was found to have an L2 compression fracture.  She has been complaining of significant back pain.  Subjective / 24h Interval events: She rested well last night, in terms of her breathing she feels it is a little bit better.  Assesement and Plan: Principal problem COPD exacerbation, chronic hypoxic respiratory failure -she had significant wheezing on admission, was started on steroid, nebulizers as well as azithromycin .  Continue.  Wheezing is resolved and she is feeling overall better.  Transition IV steroids to p.o. - Viral workup negative  Active problems L2 vertebral compression fracture -following a fall.  She has significant pain.  IR consulted for kyphoplasty, plan for today.  N.p.o.  Parkinson's disease-continue carbidopa /levodopa   Chronic systolic CHF -most recent 2D echo showed improved EF at 50-55% now, grade 1 diastolic dysfunction.  Clinically euvolemic  Essential hypertension-continue metoprolol   CKD 3A-creatinine stable, at baseline  Hyperlipidemia-continue Crestor   Anxiety/depression-stable, continue BuSpar  Hypothyroidism-continue Synthroid   Scheduled Meds:  arformoterol   15 mcg Nebulization BID   benzonatate   100 mg Oral TID   budesonide  (PULMICORT ) nebulizer solution  0.5 mg Nebulization BID   buPROPion   300 mg Oral Daily   calcium -vitamin D   2 tablet Oral Q supper   carbidopa -levodopa   1 tablet Oral TID   levothyroxine   88 mcg Oral Q0600   methylPREDNISolone  (SOLU-MEDROL ) injection  80 mg Intravenous Q24H   metoprolol  succinate  25 mg Oral QHS    revefenacin   175 mcg Nebulization Daily   rosuvastatin   10 mg Oral QHS   sodium chloride  flush  3 mL Intravenous Q12H   sodium chloride  flush  3 mL Intravenous Q12H   Continuous Infusions:  azithromycin  500 mg (12/10/23 0127)    ceFAZolin  (ANCEF ) IV     PRN Meds:.acetaminophen  **OR** acetaminophen , HYDROcodone -acetaminophen , levalbuterol  **AND** ipratropium, metoprolol  tartrate, ondansetron  **OR** ondansetron  (ZOFRAN ) IV, potassium chloride , sodium chloride  flush, tiZANidine   Current Outpatient Medications  Medication Instructions   acetaminophen  (TYLENOL ) 1,000 mg, 2 times daily PRN   albuterol  (VENTOLIN  HFA) 108 (90 Base) MCG/ACT inhaler 2 puffs, Inhalation, Every 6 hours PRN   azithromycin  (ZITHROMAX ) 250 mg, Oral, Every M-W-F   budesonide -glycopyrrolate -formoterol  (BREZTRI  AEROSPHERE) 160-9-4.8 MCG/ACT AERO inhaler 2 puffs, Inhalation, Every 12 hours   buPROPion  (WELLBUTRIN  XL) 300 mg, Daily   Calcium  Carb-Cholecalciferol  (CALCIUM -VITAMIN D  PO) 1 tablet, Daily   carbidopa -levodopa  (SINEMET  IR) 25-100 MG tablet 1 tablet, Oral, 3 times daily   Cyanocobalamin  (VITAMIN B 12 PO) 1 tablet, Daily at bedtime   diclofenac  (VOLTAREN ) 75 mg, Oral, 2 times daily   furosemide  (LASIX ) 20 mg, Daily PRN   HYDROcodone -acetaminophen  (NORCO/VICODIN) 5-325 MG tablet 1-2 tablets, Oral, Every 6 hours PRN   ipratropium-albuterol  (DUONEB) 0.5-2.5 (3) MG/3ML SOLN 3 mLs, Every 6 hours PRN   levothyroxine  (SYNTHROID ) 88 mcg, Daily before breakfast   metoprolol  succinate (TOPROL -XL) 25 mg, Daily at bedtime   Nucala  100 mg, Subcutaneous, Every 28 days   polyethylene glycol (MIRALAX  / GLYCOLAX ) 17 g, Oral, Daily PRN   potassium chloride  (KLOR-CON  M) 10 MEQ tablet 10 mEq, Oral, Daily PRN   rosuvastatin  (CRESTOR ) 10 mg,  Oral, Daily at bedtime   SYSTANE ULTRA 0.4-0.3 % SOLN 1 drop, Both Eyes, 2 times daily   tiZANidine  (ZANAFLEX ) 4 mg, Daily PRN   [Paused] valsartan  (DIOVAN ) 40 mg, Daily    Diet Orders (From  admission, onward)     Start     Ordered   12/10/23 0001  Diet NPO time specified Except for: Sips with Meds  Diet effective midnight       Question:  Except for  Answer:  Sips with Meds   12/09/23 1509            DVT prophylaxis: Place and maintain sequential compression device Start: 12/09/23 1503 SCDs Start: 12/09/23 0027 Place TED hose Start: 12/09/23 0027   Lab Results  Component Value Date   PLT 169 12/10/2023      Code Status: Full Code  Family Communication: no family at bedside   Status is: Inpatient Remains inpatient appropriate because: severity of illness  Level of care: Telemetry  Consultants:  IR  Objective: Vitals:   12/09/23 1912 12/10/23 0112 12/10/23 0725 12/10/23 0730  BP: 105/67 98/69  110/68  Pulse: 100 83  80  Resp: 16 16  15   Temp: 97.9 F (36.6 C) (!) 97.5 F (36.4 C) (!) 97.5 F (36.4 C)   TempSrc: Axillary Oral    SpO2: 100% 95%  92%  Weight:      Height:        Intake/Output Summary (Last 24 hours) at 12/10/2023 1022 Last data filed at 12/09/2023 2200 Gross per 24 hour  Intake 279.79 ml  Output --  Net 279.79 ml   Wt Readings from Last 3 Encounters:  12/09/23 63.2 kg  11/16/23 68 kg  11/01/23 68 kg    Examination:  Constitutional: NAD Eyes: no scleral icterus ENMT: Mucous membranes are moist.  Neck: normal, supple Respiratory: clear to auscultation bilaterally, no wheezing, no crackles.  Cardiovascular: Regular rate and rhythm, no murmurs / rubs / gallops. No LE edema.  Abdomen: non distended, no tenderness. Bowel sounds positive.  Musculoskeletal: no clubbing / cyanosis.    Data Reviewed: I have independently reviewed following labs and imaging studies   CBC Recent Labs  Lab 12/08/23 1645 12/08/23 1718 12/09/23 0258 12/10/23 0221  WBC 11.4*  --  7.8 9.9  HGB 13.0 9.9* 11.1* 10.1*  HCT 39.9 29.0* 34.9* 30.4*  PLT 194  --  173 169  MCV 95.0  --  97.2 93.8  MCH 31.0  --  30.9 31.2  MCHC 32.6  --  31.8  33.2  RDW 14.7  --  14.7 14.9    Recent Labs  Lab 12/08/23 1645 12/08/23 1718 12/08/23 1754 12/08/23 1905 12/08/23 1934 12/09/23 0258 12/10/23 0221  NA 139 136  --   --   --  135  --   K 4.2 3.8  --   --   --  4.1  --   CL 100  --   --   --   --  101  --   CO2 23  --   --   --   --  22  --   GLUCOSE 122*  --   --   --   --  204*  --   BUN 26*  --   --   --   --  26*  --   CREATININE 1.46*  --   --   --   --  1.29*  --   CALCIUM  9.4  --   --   --   --  8.7*  --   AST  --   --   --   --   --  27  --   ALT  --   --   --   --   --  27  --   ALKPHOS  --   --   --   --   --  97  --   BILITOT  --   --   --   --   --  0.8  --   ALBUMIN  --   --   --   --   --  3.2*  --   LATICACIDVEN  --  7.1* 1.7 3.0* 2.0*  --   --   INR  --   --   --   --   --   --  1.0  TSH  --   --   --   --   --  1.646  --     ------------------------------------------------------------------------------------------------------------------ No results for input(s): CHOL, HDL, LDLCALC, TRIG, CHOLHDL, LDLDIRECT in the last 72 hours.  Lab Results  Component Value Date   HGBA1C 6.1 11/24/2021   ------------------------------------------------------------------------------------------------------------------ Recent Labs    12/09/23 0258  TSH 1.646    Cardiac Enzymes No results for input(s): CKMB, TROPONINI, MYOGLOBIN in the last 168 hours.  Invalid input(s): CK ------------------------------------------------------------------------------------------------------------------    Component Value Date/Time   BNP 8.8 09/29/2023 1304    CBG: No results for input(s): GLUCAP in the last 168 hours.  Recent Results (from the past 240 hours)  Culture, blood (routine x 2)     Status: None (Preliminary result)   Collection Time: 12/08/23  4:55 PM   Specimen: BLOOD  Result Value Ref Range Status   Specimen Description BLOOD RIGHT ANTECUBITAL  Final   Special Requests   Final    BOTTLES  DRAWN AEROBIC AND ANAEROBIC Blood Culture results may not be optimal due to an inadequate volume of blood received in culture bottles   Culture   Final    NO GROWTH 2 DAYS Performed at [facility], [address], [place], [place] [address]    Report Status PENDING  Incomplete  Culture, blood (routine x 2)     Status: None (Preliminary result)   Collection Time: 12/08/23  5:02 PM   Specimen: BLOOD LEFT FOREARM  Result Value Ref Range Status   Specimen Description BLOOD LEFT FOREARM  Final   Special Requests   Final    BOTTLES DRAWN AEROBIC AND ANAEROBIC Blood Culture results may not be optimal due to an inadequate volume of blood received in culture bottles   Culture   Final    NO GROWTH 2 DAYS Performed at [facility], [address], [place], [place] [address]    Report Status PENDING  Incomplete  Respiratory (~20 pathogens) panel by PCR     Status: None   Collection Time: 12/09/23 12:27 AM   Specimen: Nasopharyngeal Swab; Respiratory  Result Value Ref Range Status   Adenovirus NOT DETECTED NOT DETECTED Final   Coronavirus 229E NOT DETECTED NOT DETECTED Final    Comment: (NOTE) The Coronavirus on the Respiratory Panel, DOES NOT test for the novel  Coronavirus (2019 nCoV)    Coronavirus HKU1 NOT DETECTED NOT DETECTED Final   Coronavirus NL63 NOT DETECTED NOT DETECTED Final   Coronavirus OC43 NOT DETECTED NOT DETECTED Final   Metapneumovirus NOT DETECTED NOT DETECTED Final   Rhinovirus / Enterovirus NOT DETECTED NOT DETECTED  Final   Influenza A NOT DETECTED NOT DETECTED Final   Influenza B NOT DETECTED NOT DETECTED Final   Parainfluenza Virus 1 NOT DETECTED NOT DETECTED Final   Parainfluenza Virus 2 NOT DETECTED NOT DETECTED Final   Parainfluenza Virus 3 NOT DETECTED NOT DETECTED Final   Parainfluenza Virus 4 NOT DETECTED NOT DETECTED Final   Respiratory Syncytial Virus NOT DETECTED NOT DETECTED Final   Bordetella pertussis NOT DETECTED NOT DETECTED Final    Bordetella Parapertussis NOT DETECTED NOT DETECTED Final   Chlamydophila pneumoniae NOT DETECTED NOT DETECTED Final   Mycoplasma pneumoniae NOT DETECTED NOT DETECTED Final    Comment: Performed at [facility], [address] [place], [place] [address]  SARS Coronavirus 2 by RT PCR (hospital order, performed in [facility] lab) *cepheid single result test* Anterior Nasal Swab     Status: None   Collection Time: 12/09/23 10:41 AM   Specimen: Anterior Nasal Swab  Result Value Ref Range Status   SARS Coronavirus 2 by RT PCR NEGATIVE NEGATIVE Final    Comment: Performed at [facility], [address], [place], [place] [address]     Radiology Studies: No results found.   Nilda Fendt, MD, PhD [facility]  Between 7 am - 7 pm I am available, please contact me via Amion (for emergencies) or Securechat (non urgent messages)  Between 7 pm - 7 am I am not available, please contact night coverage MD/APP via Amion

## 2023-12-10 NOTE — TOC Progression Note (Addendum)
 Transition of Care [facility]) - Progression Note    Patient Details  Name: April [name] MRN: [id number] Date of Birth: 04-Dec-1955  [facility]) CM/SW Contact  Luise JAYSON Pan, [place] Phone Number: 12/10/2023, 1:33 PM  Clinical Narrative:   CSW left vm for patients son. Awaiting call back to discuss PT rec for SNF.   3:16 PM CSW followed up with patient about PT rec for SNF. Patient declined and stated she'd rather go back to her ILF with home health (Agency: [facility]). CSW notified RNCM.  CSW inquired with patient about her mobility. Patient stated her issue is more so about her balance than her mobility.   CSW asked patient about her son being her POA. Patient stated she is in the process of making her son POA and has to fill out/file the paperwork for it. Patient stated I believe he would do a good job as my POA.   CSW will continue to follow.   Expected Discharge Plan: Home w Home Health Services (From [facility]) Barriers to Discharge: Continued Medical Work up               Expected Discharge Plan and Services   Discharge Planning Services: CM Consult Post Acute Care Choice: NA Living arrangements for the past 2 months: Independent Living Facility                   DME Agency: NA       HH Arranged: NA           Social Drivers of Health (SDOH) Interventions SDOH Screenings   Food Insecurity: No Food Insecurity (12/09/2023)  Housing: Low Risk  (12/09/2023)  Recent Concern: Housing - High Risk (09/30/2023)  Transportation Needs: Unmet Transportation Needs (12/09/2023)  Utilities: Not At Risk (12/09/2023)  Alcohol  Screen: Low Risk  (11/26/2021)  Depression (PHQ2-9): Low Risk  (04/08/2023)  Financial Resource Strain: Medium Risk (12/01/2022)  Physical Activity: Insufficiently Active (12/01/2022)  Social Connections: Moderately Isolated (12/09/2023)  Stress: No Stress Concern Present (12/01/2022)  Tobacco Use: Medium Risk (12/09/2023)  Health  Literacy: Adequate Health Literacy (12/01/2022)    Readmission Risk Interventions    12/09/2023    1:31 PM  Readmission Risk Prevention Plan  Transportation Screening Complete  HRI or Home Care Consult Complete  Palliative Care Screening Not Applicable  Medication Review (RN Care Manager) Complete

## 2023-12-10 NOTE — Procedures (Signed)
 Interventional Radiology Procedure Note  Procedure: Fluoro guided L2 kyphoplasty  Complications: None  Estimated Blood Loss: < 10 mL  Findings: Fluoro guided bilateral pedicle access for L2 balloon placement and cement deposition with fluoro.  Cordella DELENA Banner, MD

## 2023-12-10 NOTE — Progress Notes (Signed)
 OT Cancellation Note  Patient Details Name: April [name] MRN: [id number] DOB: Aug 13, 1955   Cancelled Treatment:    Reason Eval/Treat Not Completed: Patient at procedure or test/ unavailable (Pt in IR for procedure. Will continue efforts.)   Janaia Kozel M. [place], OTR/L [facility] Acute Rehabilitation Services [phone] Secure Chat Preferred  [place] Delauder 12/10/2023, 4:08 PM

## 2023-12-10 NOTE — TOC Progression Note (Addendum)
 Transition of Care [facility]) - Progression Note    Patient Details  Name: April [name] MRN: [id number] Date of Birth: [birth year]  [facility]) CM/SW Contact  Waddell Barnie Rama, RN Phone Number: 12/10/2023, 3:42 PM  Clinical Narrative:    Per CSW patient has declined SNF and would like to work with [facility] at her IDL facility.  NCM asked MD for orders , will fax to [facility] at [phone],  [facility] phone number is [phone].  Orders [facility] to [facility].   Expected Discharge Plan: Home w Home Health Services (From [facility]) Barriers to Discharge: Continued Medical Work up               Expected Discharge Plan and Services   Discharge Planning Services: CM Consult Post Acute Care Choice: NA Living arrangements for the past 2 months: Independent Living Facility                   DME Agency: NA       HH Arranged: NA           Social Drivers of Health (SDOH) Interventions SDOH Screenings   Food Insecurity: No Food Insecurity (12/09/2023)  Housing: Low Risk  (12/09/2023)  Recent Concern: Housing - High Risk (09/30/2023)  Transportation Needs: Unmet Transportation Needs (12/09/2023)  Utilities: Not At Risk (12/09/2023)  Alcohol  Screen: Low Risk  (11/26/2021)  Depression (PHQ2-9): Low Risk  (04/08/2023)  Financial Resource Strain: Medium Risk (12/01/2022)  Physical Activity: Insufficiently Active (12/01/2022)  Social Connections: Moderately Isolated (12/09/2023)  Stress: No Stress Concern Present (12/01/2022)  Tobacco Use: Medium Risk (12/09/2023)  Health Literacy: Adequate Health Literacy (12/01/2022)    Readmission Risk Interventions    12/09/2023    1:31 PM  Readmission Risk Prevention Plan  Transportation Screening Complete  HRI or Home Care Consult Complete  Palliative Care Screening Not Applicable  Medication Review (RN Care Manager) Complete

## 2023-12-10 NOTE — Evaluation (Signed)
 Physical Therapy Evaluation Patient Details Name: April [name] MRN: [id number] DOB: 11-03-1955 Today's Date: 12/10/2023  History of Present Illness  Pt is a 68 y.o. F presenting to [facility] on 12/08/23 with SOB; admitted with COPD exacerbation and L2 fx (pending surgery). PMH is significant for COPD, chronic hypoxic respiratory failure on 2L, HTN, HLD, CKD, hypothyroidism, anxiety and depression.    Clinical Impression  Prior to admittance, pt was performing stand pivot transfers to manual wheelchair and using her wheelchair as her primary means of mobilizing. Pt reports within the past 6 months she has fallen numerous times resulting in either skin lacerations or fractures. Pt presents to evaluation with deficits in mobility, strength, power, activity tolerance, balance, and pain, all limiting pt's ability to safely mobilize. Pt was able to perform bed mobility with minimal physical assistance, following spinal precautions for comfort. Pt was then educated on appropriate donning/doffing of LSO with pt verbalizing understanding. Pt would benefit from transfer training. PT will continue to treat pt while she is admitted. Given number of falls pt has had and recent change in level of function, recommending continued inpatient follow up therapy, <3 hours/day.      If plan is discharge home, recommend the following: A lot of help with walking and/or transfers;A lot of help with bathing/dressing/bathroom;Assistance with cooking/housework;Assist for transportation   Can travel by private vehicle   No    Equipment Recommendations None recommended by PT  Recommendations for Other Services       Functional Status Assessment Patient has had a recent decline in their functional status and demonstrates the ability to make significant improvements in function in a reasonable and predictable amount of time.     Precautions / Restrictions Precautions Precautions: Fall;Back Precaution Booklet Issued: No  (bring next session) Recall of Precautions/Restrictions: Intact (able to recall 3/3) Required Braces or Orthoses: Spinal Brace (LSO) Spinal Brace: Lumbar corset Restrictions Weight Bearing Restrictions Per Provider Order: Yes RUE Weight Bearing Per Provider Order: Partial weight bearing (per Dr. Trixie via secure chat)      Mobility  Bed Mobility Overal bed mobility: Needs Assistance Bed Mobility: Rolling, Sidelying to Sit, Sit to Sidelying Rolling: Contact guard assist, Used rails Sidelying to sit: Min assist     Sit to sidelying: Min assist General bed mobility comments: VC given for step by step sequencing of log roll. Pt required min A for sidelying to supine and min A for supine to sidelying for RLE management. Increased time and effort to complete.    Transfers Overall transfer level:  (deferred at this time)                      Ambulation/Gait                  Stairs            Wheelchair Mobility     Tilt Bed    Modified Rankin (Stroke Patients Only)       Balance Overall balance assessment: Needs assistance Sitting-balance support: Bilateral upper extremity supported, Feet supported Sitting balance-Leahy Scale: Fair Sitting balance - Comments: seated EOB   Standing balance support:  (deferred)                                 Pertinent Vitals/Pain Pain Assessment Pain Assessment: Faces Faces Pain Scale: Hurts even more Pain Location: lumbar spine Pain Descriptors / Indicators: Discomfort,  Grimacing, Guarding Pain Intervention(s): Limited activity within patient's tolerance, Monitored during session    Home Living Family/patient expects to be discharged to:: Assisted living                 Home Equipment: Wheelchair - manual;Shower seat - built in;Grab bars - tub/shower;Grab bars - toilet;Hand held Stage Manager (4 wheels);BSC/3in1;Cane - quad      Prior Function Prior Level of Function : Needs  assist;History of Falls (last six months)       Physical Assist : Mobility (physical);ADLs (physical) Mobility (physical): Transfers ADLs (physical): Bathing Mobility Comments: pt performs either scoot or pivot transfer to wheelchair and bathroom. pt requires physcial assistance for transferring to showerchair but is then able to perform bathing needs herself. ADLs Comments: pt reports she has been receiving PT and OT at facility     Extremity/Trunk Assessment   Upper Extremity Assessment Upper Extremity Assessment: Defer to OT evaluation    Lower Extremity Assessment Lower Extremity Assessment: Generalized weakness    Cervical / Trunk Assessment Cervical / Trunk Assessment: Kyphotic  Communication   Communication Communication: No apparent difficulties    Cognition Arousal: Alert Behavior During Therapy: WFL for tasks assessed/performed   PT - Cognitive impairments: Problem solving, Safety/Judgement, Sequencing                         Following commands: Intact       Cueing Cueing Techniques: Verbal cues, Gestural cues, Visual cues     General Comments General comments (skin integrity, edema, etc.): on 1L w SpO2 reading 97%    Exercises     Assessment/Plan    PT Assessment Patient needs continued PT services  PT Problem List Decreased strength;Decreased activity tolerance;Decreased range of motion;Decreased balance;Decreased mobility;Decreased coordination;Decreased knowledge of use of DME;Decreased safety awareness;Pain       PT Treatment Interventions DME instruction;Gait training;Functional mobility training;Therapeutic activities;Therapeutic exercise;Balance training;Neuromuscular re-education;Patient/family education;Wheelchair mobility training;Manual techniques    PT Goals (Current goals can be found in the Care Plan section)  Acute Rehab PT Goals Patient Stated Goal: for pain to subside PT Goal Formulation: With patient Time For Goal  Achievement: 12/24/23 Potential to Achieve Goals: Fair Additional Goals Additional Goal #1: pt will be mod I with manual WC mobility    Frequency Min 2X/week     Co-evaluation               AM-PAC PT 6 Clicks Mobility  Outcome Measure Help needed turning from your back to your side while in a flat bed without using bedrails?: A Little Help needed moving from lying on your back to sitting on the side of a flat bed without using bedrails?: A Little Help needed moving to and from a bed to a chair (including a wheelchair)?: Total Help needed standing up from a chair using your arms (e.g., wheelchair or bedside chair)?: Total Help needed to walk in hospital room?: Total Help needed climbing 3-5 steps with a railing? : Total 6 Click Score: 10    End of Session Equipment Utilized During Treatment: Oxygen  Activity Tolerance: Patient limited by pain Patient left: in bed;with call bell/phone within reach;with bed alarm set Nurse Communication: Mobility status PT Visit Diagnosis: Unsteadiness on feet (R26.81);Repeated falls (R29.6);History of falling (Z91.81);Muscle weakness (generalized) (M62.81);Pain Pain - Right/Left:  (spine)    Time: 9046-8972 PT Time Calculation (min) (ACUTE ONLY): 34 min   Charges:   PT Evaluation $PT Eval Moderate Complexity: 1  Mod   PT General Charges $$ ACUTE PT VISIT: 1 Visit         Leontine Hilt DPT Acute Rehab Services [phone] Prefer contact via chat   Leontine NOVAK Manjit Bufano 12/10/2023, 1:35 PM

## 2023-12-10 NOTE — Progress Notes (Signed)
   12/10/23 2100  BiPAP/CPAP/SIPAP  Reason BIPAP/CPAP not in use Non-compliant (Pt. refused CPAP. Educated pt. on why to wear it.)

## 2023-12-11 DIAGNOSIS — J441 Chronic obstructive pulmonary disease with (acute) exacerbation: Secondary | ICD-10-CM | POA: Diagnosis not present

## 2023-12-11 MED ORDER — BUDESON-GLYCOPYRROL-FORMOTEROL 160-9-4.8 MCG/ACT IN AERO
2.0000 | INHALATION_SPRAY | Freq: Two times a day (BID) | RESPIRATORY_TRACT | Status: DC
  Administered 2023-12-11 – 2023-12-13 (×4): 2 via RESPIRATORY_TRACT
  Filled 2023-12-11 (×2): qty 5.9

## 2023-12-11 MED ORDER — ALBUTEROL SULFATE (2.5 MG/3ML) 0.083% IN NEBU: 3.0000 mL | INHALATION_SOLUTION | Freq: Four times a day (QID) | RESPIRATORY_TRACT | Status: DC | PRN

## 2023-12-11 MED ORDER — SENNA 8.6 MG PO TABS
1.0000 | ORAL_TABLET | Freq: Every day | ORAL | Status: DC | PRN
  Administered 2023-12-11 – 2023-12-13 (×2): 17.2 mg via ORAL
  Filled 2023-12-11 (×2): qty 2

## 2023-12-11 MED ORDER — ENOXAPARIN SODIUM 40 MG/0.4ML IJ SOSY
40.0000 mg | PREFILLED_SYRINGE | INTRAMUSCULAR | Status: DC
  Administered 2023-12-11 – 2023-12-12 (×2): 40 mg via SUBCUTANEOUS
  Filled 2023-12-11 (×2): qty 0.4

## 2023-12-11 NOTE — Progress Notes (Signed)
 Error in charting on pt. (Earlier message). Message was meant for room 4. (Not 5) at 8:03 [id number]

## 2023-12-11 NOTE — Progress Notes (Signed)
 PROGRESS NOTE  April [name] FMW:[id number] DOB: 1955-09-15 DOA: 12/08/2023 PCP: Patient, No Pcp Per   LOS: 2 days   Brief Narrative / Interim history: 68 year old female with COPD, chronic hypoxemia on 2 L at home as needed, Parkinson's disease, CKD 3A, HTN, PMR, comes into the hospital with shortness of breath, wheezing dry cough, admitted with COPD exacerbation.  She also had a fall prior to admission and was found to have an L2 compression fracture.  She has been complaining of significant back pain.  Subjective / 24h Interval events: Breathing is not that good this morning, she just got a nebulizer and tells me that she really does not like them.  Assesement and Plan: Principal problem COPD exacerbation, chronic hypoxic respiratory failure -she had significant wheezing on admission, was started on steroid, nebulizers as well as azithromycin .  She was transition to oral steroids - She does not feel like the nebulizers are helping, switch back to Breztri  - Viral workup negative - Hopeful for home tomorrow  Active problems L2 vertebral compression fracture -following a fall.  She has significant pain.  IR consulted, status post kyphoplasty 12/5  Parkinson's disease-continue carbidopa /levodopa   Chronic systolic CHF -most recent 2D echo showed improved EF at 50-55% now, grade 1 diastolic dysfunction.  Clinically euvolemic  Essential hypertension-continue metoprolol   CKD 3A-creatinine stable, at baseline  Hyperlipidemia-continue Crestor   Anxiety/depression-stable, continue BuSpar  Right radial and ulnar fractures -about a month ago, this is managed nonoperatively, followed by orthopedics as an outpatient.  Hypothyroidism-continue Synthroid   Scheduled Meds:  benzonatate   100 mg Oral TID   budesonide -glycopyrrolate -formoterol   2 puff Inhalation Q12H   buPROPion   300 mg Oral Daily   calcium -vitamin D   2 tablet Oral Q supper   carbidopa -levodopa   1 tablet Oral TID    enoxaparin  (LOVENOX ) injection  40 mg Subcutaneous Q24H   levothyroxine   88 mcg Oral Q0600   methylPREDNISolone  (SOLU-MEDROL ) injection  80 mg Intravenous Q24H   metoprolol  succinate  25 mg Oral QHS   rosuvastatin   10 mg Oral QHS   sodium chloride  flush  3 mL Intravenous Q12H   sodium chloride  flush  3 mL Intravenous Q12H   Continuous Infusions:   PRN Meds:.acetaminophen  **OR** acetaminophen , HYDROcodone -acetaminophen , levalbuterol  **AND** ipratropium, metoprolol  tartrate, ondansetron  **OR** ondansetron  (ZOFRAN ) IV, potassium chloride , sodium chloride  flush, tiZANidine   Current Outpatient Medications  Medication Instructions   acetaminophen  (TYLENOL ) 1,000 mg, 2 times daily PRN   albuterol  (VENTOLIN  HFA) 108 (90 Base) MCG/ACT inhaler 2 puffs, Inhalation, Every 6 hours PRN   azithromycin  (ZITHROMAX ) 250 mg, Oral, Every M-W-F   budesonide -glycopyrrolate -formoterol  (BREZTRI  AEROSPHERE) 160-9-4.8 MCG/ACT AERO inhaler 2 puffs, Inhalation, Every 12 hours   buPROPion  (WELLBUTRIN  XL) 300 mg, Daily   Calcium  Carb-Cholecalciferol  (CALCIUM -VITAMIN D  PO) 1 tablet, Daily   carbidopa -levodopa  (SINEMET  IR) 25-100 MG tablet 1 tablet, Oral, 3 times daily   Cyanocobalamin  (VITAMIN B 12 PO) 1 tablet, Daily at bedtime   diclofenac  (VOLTAREN ) 75 mg, Oral, 2 times daily   furosemide  (LASIX ) 20 mg, Daily PRN   HYDROcodone -acetaminophen  (NORCO/VICODIN) 5-325 MG tablet 1-2 tablets, Oral, Every 6 hours PRN   ipratropium-albuterol  (DUONEB) 0.5-2.5 (3) MG/3ML SOLN 3 mLs, Every 6 hours PRN   levothyroxine  (SYNTHROID ) 88 mcg, Daily before breakfast   metoprolol  succinate (TOPROL -XL) 25 mg, Daily at bedtime   Nucala  100 mg, Subcutaneous, Every 28 days   polyethylene glycol (MIRALAX  / GLYCOLAX ) 17 g, Oral, Daily PRN   potassium chloride  (KLOR-CON  M) 10 MEQ tablet 10 mEq, Oral, Daily PRN  rosuvastatin  (CRESTOR ) 10 mg, Oral, Daily at bedtime   SYSTANE ULTRA 0.4-0.3 % SOLN 1 drop, Both Eyes, 2 times daily    tiZANidine  (ZANAFLEX ) 4 mg, Daily PRN   [Paused] valsartan  (DIOVAN ) 40 mg, Daily    Diet Orders (From admission, onward)     Start     Ordered   12/10/23 1808  Diet regular Room service appropriate? Yes; Fluid consistency: Thin  Diet effective now       Question Answer Comment  Room service appropriate? Yes   Fluid consistency: Thin      12/10/23 1807            DVT prophylaxis: enoxaparin  (LOVENOX ) injection 40 mg Start: 12/11/23 1115 Place and maintain sequential compression device Start: 12/09/23 1503 SCDs Start: 12/09/23 0027 Place TED hose Start: 12/09/23 0027   Lab Results  Component Value Date   PLT 169 12/10/2023      Code Status: Full Code  Family Communication: no family at bedside   Status is: Inpatient Remains inpatient appropriate because: severity of illness  Level of care: Telemetry  Consultants:  IR  Objective: Vitals:   12/11/23 0800 12/11/23 0846 12/11/23 1108 12/11/23 1609  BP:  (!) 125/56 121/72 118/73  Pulse:  (!) 103 81 76  Resp:  18 13 14   Temp:  97.8 F (36.6 C) (!) 97.5 F (36.4 C) 98 F (36.7 C)  TempSrc:  Oral Oral Oral  SpO2: 100% 98% 100% 95%  Weight:      Height:        Intake/Output Summary (Last 24 hours) at 12/11/2023 1619 Last data filed at 12/11/2023 0848 Gross per 24 hour  Intake 490 ml  Output 700 ml  Net -210 ml   Wt Readings from Last 3 Encounters:  12/09/23 63.2 kg  11/16/23 68 kg  11/01/23 68 kg    Examination:  Constitutional: NAD Eyes: lids and conjunctivae normal, no scleral icterus ENMT: mmm Neck: normal, supple Respiratory: clear to auscultation bilaterally, no wheezing, no crackles. Normal respiratory effort.  Cardiovascular: Regular rate and rhythm, no murmurs / rubs / gallops. No LE edema. Abdomen: soft, no distention, no tenderness. Bowel sounds positive.    Data Reviewed: I have independently reviewed following labs and imaging studies   CBC Recent Labs  Lab 12/08/23 1645  12/08/23 1718 12/09/23 0258 12/10/23 0221  WBC 11.4*  --  7.8 9.9  HGB 13.0 9.9* 11.1* 10.1*  HCT 39.9 29.0* 34.9* 30.4*  PLT 194  --  173 169  MCV 95.0  --  97.2 93.8  MCH 31.0  --  30.9 31.2  MCHC 32.6  --  31.8 33.2  RDW 14.7  --  14.7 14.9    Recent Labs  Lab 12/08/23 1645 12/08/23 1718 12/08/23 1754 12/08/23 1905 12/08/23 1934 12/09/23 0258 12/10/23 0221  NA 139 136  --   --   --  135  --   K 4.2 3.8  --   --   --  4.1  --   CL 100  --   --   --   --  101  --   CO2 23  --   --   --   --  22  --   GLUCOSE 122*  --   --   --   --  204*  --   BUN 26*  --   --   --   --  26*  --   CREATININE 1.46*  --   --   --   --  1.29*  --   CALCIUM  9.4  --   --   --   --  8.7*  --   AST  --   --   --   --   --  27  --   ALT  --   --   --   --   --  27  --   ALKPHOS  --   --   --   --   --  97  --   BILITOT  --   --   --   --   --  0.8  --   ALBUMIN  --   --   --   --   --  3.2*  --   LATICACIDVEN  --  7.1* 1.7 3.0* 2.0*  --   --   INR  --   --   --   --   --   --  1.0  TSH  --   --   --   --   --  1.646  --     ------------------------------------------------------------------------------------------------------------------ No results for input(s): CHOL, HDL, LDLCALC, TRIG, CHOLHDL, LDLDIRECT in the last 72 hours.  Lab Results  Component Value Date   HGBA1C 6.1 11/24/2021   ------------------------------------------------------------------------------------------------------------------ Recent Labs    12/09/23 0258  TSH 1.646    Cardiac Enzymes No results for input(s): CKMB, TROPONINI, MYOGLOBIN in the last 168 hours.  Invalid input(s): CK ------------------------------------------------------------------------------------------------------------------    Component Value Date/Time   BNP 8.8 09/29/2023 1304    CBG: No results for input(s): GLUCAP in the last 168 hours.  Recent Results (from the past 240 hours)  Culture, blood (routine x  2)     Status: None (Preliminary result)   Collection Time: 12/08/23  4:55 PM   Specimen: BLOOD  Result Value Ref Range Status   Specimen Description BLOOD RIGHT ANTECUBITAL  Final   Special Requests   Final    BOTTLES DRAWN AEROBIC AND ANAEROBIC Blood Culture results may not be optimal due to an inadequate volume of blood received in culture bottles   Culture   Final    NO GROWTH 3 DAYS Performed at [facility] Lab, [address], [place], [place] [address]    Report Status PENDING  Incomplete  Culture, blood (routine x 2)     Status: None (Preliminary result)   Collection Time: 12/08/23  5:02 PM   Specimen: BLOOD LEFT FOREARM  Result Value Ref Range Status   Specimen Description BLOOD LEFT FOREARM  Final   Special Requests   Final    BOTTLES DRAWN AEROBIC AND ANAEROBIC Blood Culture results may not be optimal due to an inadequate volume of blood received in culture bottles   Culture   Final    NO GROWTH 3 DAYS Performed at [facility], [address], [place], [place] [address]    Report Status PENDING  Incomplete  Respiratory (~20 pathogens) panel by PCR     Status: None   Collection Time: 12/09/23 12:27 AM   Specimen: Nasopharyngeal Swab; Respiratory  Result Value Ref Range Status   Adenovirus NOT DETECTED NOT DETECTED Final   Coronavirus 229E NOT DETECTED NOT DETECTED Final    Comment: (NOTE) The Coronavirus on the Respiratory Panel, DOES NOT test for the novel  Coronavirus (2019 nCoV)    Coronavirus HKU1 NOT DETECTED NOT DETECTED Final   Coronavirus NL63 NOT DETECTED NOT DETECTED Final   Coronavirus OC43 NOT DETECTED NOT  DETECTED Final   Metapneumovirus NOT DETECTED NOT DETECTED Final   Rhinovirus / Enterovirus NOT DETECTED NOT DETECTED Final   Influenza A NOT DETECTED NOT DETECTED Final   Influenza B NOT DETECTED NOT DETECTED Final   Parainfluenza Virus 1 NOT DETECTED NOT DETECTED Final   Parainfluenza Virus 2 NOT DETECTED NOT DETECTED Final    Parainfluenza Virus 3 NOT DETECTED NOT DETECTED Final   Parainfluenza Virus 4 NOT DETECTED NOT DETECTED Final   Respiratory Syncytial Virus NOT DETECTED NOT DETECTED Final   Bordetella pertussis NOT DETECTED NOT DETECTED Final   Bordetella Parapertussis NOT DETECTED NOT DETECTED Final   Chlamydophila pneumoniae NOT DETECTED NOT DETECTED Final   Mycoplasma pneumoniae NOT DETECTED NOT DETECTED Final    Comment: Performed at [facility], [address], [place], [place] [address]  SARS Coronavirus 2 by RT PCR (hospital order, performed in [facility] hospital lab) *cepheid single result test* Anterior Nasal Swab     Status: None   Collection Time: 12/09/23 10:41 AM   Specimen: Anterior Nasal Swab  Result Value Ref Range Status   SARS Coronavirus 2 by RT PCR NEGATIVE NEGATIVE Final    Comment: Performed at [facility], [address], [place], [place] [address]     Radiology Studies: No results found.   Nilda Fendt, MD, PhD [facility]  Between 7 am - 7 pm I am available, please contact me via Amion (for emergencies) or Securechat (non urgent messages)  Between 7 pm - 7 am I am not available, please contact night coverage MD/APP via Amion

## 2023-12-11 NOTE — Progress Notes (Deleted)
 Nurse stopped me in the hallway and stated the pt. Is needing a breathing treatment. The nurse stated the pt. Just had an EKG and her heart rate was high 120'S. I went (Respiratory) into the pt. Room and talked with the pt. And decided It wasn't safe to give her the Albuterol  PRN she was requesting. The pt. daughter or guest was upset with me. I explained one of the side effects of Albuterol : Increased heart rate. I talked with the nurse and she spoke with the doctor. Nursed replied back to me saying the MD. Didn't want pt. To have the albuterol  tx. I went back and talked with the pt. The pt. Does have anxiety and I suggested something for that. Nurse made aware.

## 2023-12-11 NOTE — Plan of Care (Signed)
   Problem: Activity: Goal: Risk for activity intolerance will decrease Outcome: Progressing

## 2023-12-11 NOTE — Plan of Care (Signed)

## 2023-12-11 NOTE — Evaluation (Signed)
 Occupational Therapy Evaluation Patient Details Name: April [name] MRN: [id number] DOB: 04/24/1955 Today's Date: 12/11/2023   History of Present Illness   Pt is a 68 y.o. F presenting to [facility] on 12/08/23 with SOB; admitted with COPD exacerbation and L2 fx (pending surgery). PMH is significant for COPD, chronic hypoxic respiratory failure on 2L, HTN, HLD, CKD, hypothyroidism, anxiety and depression.     Clinical Impressions Pt seen for OT eval this PM. AOX4, very engaged and participatory. PTA, pt was indep with BADLs and had assist for transfers to shower chair, otherwise was primarily pivoting to w/c and toilet indep. Endorsing much improved pain in back compared to yesterday. Functionally, she was CGA for bed mobility with cueing for log roll technique. Min A via HHA for STS and step pivot to chair via HHA. Max A for LB ADLs and min-mod A for UB ADLs. Cleansed skin beneath long arm splint for R distal forearm fxs. Updated RN on pt status, left upright in chair - pt reporting much improved comfort in recliner. VSS on 2L [place].  Pt is currently functioning below baseline and would benefit from ongoing acute OT services to progress towards safe discharge and to facilitate return to prior level of function. Current recommendation is post-acute rehab (< 3 hours/day).     If plan is discharge home, recommend the following:   A lot of help with walking and/or transfers;A lot of help with bathing/dressing/bathroom;Assistance with cooking/housework;Direct supervision/assist for medications management;Assist for transportation;Help with stairs or ramp for entrance     Functional Status Assessment   Patient has had a recent decline in their functional status and demonstrates the ability to make significant improvements in function in a reasonable and predictable amount of time.     Equipment Recommendations   Other (comment) (defer)     Recommendations for Other Services          Precautions/Restrictions   Precautions Precautions: Fall;Back Precaution Booklet Issued: No (verbally reviewed) Recall of Precautions/Restrictions: Intact Required Braces or Orthoses: Spinal Brace Spinal Brace: Lumbar corset;Applied in sitting position Restrictions Weight Bearing Restrictions Per Provider Order: Yes RUE Weight Bearing Per Provider Order: Partial weight bearing     Mobility Bed Mobility Overal bed mobility: Needs Assistance Bed Mobility: Rolling, Sidelying to Sit Rolling: Contact guard assist, Used rails         General bed mobility comments: exited to the L side, cued through log roll technique    Transfers Overall transfer level: Needs assistance Equipment used: 1 person hand held assist Transfers: Sit to/from Stand, Bed to chair/wheelchair/BSC Sit to Stand: Min assist     Step pivot transfers: Min assist     General transfer comment: Stood from bed with cues for proper hand placement and to maintain PWB RUE, took few steps to chair with min A and HHA.      Balance Overall balance assessment: Needs assistance Sitting-balance support: Single extremity supported, Feet supported Sitting balance-Leahy Scale: Fair Sitting balance - Comments: seated EOB, no LOB   Standing balance support: Single extremity supported, During functional activity Standing balance-Leahy Scale: Poor Standing balance comment: min A via HHA in stance, initially mildly retropulsive  but corrects with cues and external support                           ADL either performed or assessed with clinical judgement   ADL Overall ADL's : Needs assistance/impaired Eating/Feeding: Set up   Grooming: Set  up;Sitting;Wash/dry face   Upper Body Bathing: Minimal assistance   Lower Body Bathing: Maximal assistance   Upper Body Dressing : Moderate assistance;Sitting Upper Body Dressing Details (indicate cue type and reason): donning LSO brace and gown exchange Lower Body  Dressing: Total assistance Lower Body Dressing Details (indicate cue type and reason): donned B socks, unable to achieve figure four technique             Functional mobility during ADLs: Minimal assistance       Vision Baseline Vision/History: 1 Wears glasses Ability to See in Adequate Light: 0 Adequate Patient Visual Report: No change from baseline       Perception         Praxis         Pertinent Vitals/Pain Pain Assessment Pain Assessment: 0-10 Pain Score: 2  Pain Location: lower back Pain Descriptors / Indicators: Discomfort, Grimacing, Guarding Pain Intervention(s): Limited activity within patient's tolerance, Monitored during session, Repositioned     Extremity/Trunk Assessment Upper Extremity Assessment Upper Extremity Assessment: Left hand dominant;RUE deficits/detail;LUE deficits/detail RUE Deficits / Details: strength not assessed distally 2/2 distal radial & ulnar fxs in long arm splint, AROM proximally WFL RUE Sensation: WNL RUE Coordination: WNL LUE Deficits / Details: mild shoulder flexion deficits (reports hx TSA) LUE Sensation: WNL LUE Coordination: WNL   Lower Extremity Assessment Lower Extremity Assessment: Defer to PT evaluation   Cervical / Trunk Assessment Cervical / Trunk Assessment: Kyphotic;Back Surgery   Communication Communication Communication: No apparent difficulties   Cognition Arousal: Alert Behavior During Therapy: WFL for tasks assessed/performed Cognition: No apparent impairments             OT - Cognition Comments: very engaged and particiaptory with OT                 Following commands: Intact       Cueing  General Comments   Cueing Techniques: Verbal cues;Gestural cues;Visual cues  VSS on 2L [place]   Exercises     Shoulder Instructions      Home Living Family/patient expects to be discharged to:: Assisted living                             Home Equipment: Wheelchair - manual;Shower  seat - built in;Grab bars - tub/shower;Grab bars - toilet;Hand held Stage Manager (4 wheels);BSC/3in1;Cane - quad   Additional Comments: Unclear whether pt is in ILF or ALF. Reports she was receiving OP OT, PT, SLP 3 days/wk PTA.      Prior Functioning/Environment Prior Level of Function : Needs assist;History of Falls (last six months)       Physical Assist : Mobility (physical);ADLs (physical) Mobility (physical): Transfers (shower transfers) ADLs (physical): Bathing Mobility Comments: has been pivoting to w/c and toilet without assist, has assistance to transfer onto shower chair ADLs Comments: reports indep with BADLs, has assistance with cleaning & meal prep    OT Problem List: Decreased strength;Decreased activity tolerance;Impaired balance (sitting and/or standing);Impaired UE functional use   OT Treatment/Interventions: Self-care/ADL training;Therapeutic exercise;Energy conservation;DME and/or AE instruction;Therapeutic activities;Patient/family education;Balance training      OT Goals(Current goals can be found in the care plan section)   Acute Rehab OT Goals Patient Stated Goal: get stronger and be able to walk again OT Goal Formulation: With patient Time For Goal Achievement: 12/25/23 Potential to Achieve Goals: Good   OT Frequency:  Min 2X/week    Co-evaluation  AM-PAC OT 6 Clicks Daily Activity     Outcome Measure Help from another person eating meals?: None Help from another person taking care of personal grooming?: A Little Help from another person toileting, which includes using toliet, bedpan, or urinal?: A Lot Help from another person bathing (including washing, rinsing, drying)?: A Lot Help from another person to put on and taking off regular upper body clothing?: A Lot Help from another person to put on and taking off regular lower body clothing?: A Lot 6 Click Score: 15   End of Session Equipment Utilized During Treatment:  Gait belt Nurse Communication: Mobility status  Activity Tolerance: Patient tolerated treatment well Patient left: in chair;with call bell/phone within reach;with chair alarm set  OT Visit Diagnosis: Unsteadiness on feet (R26.81);Other abnormalities of gait and mobility (R26.89);Muscle weakness (generalized) (M62.81);History of falling (Z91.81);Repeated falls (R29.6);Pain Pain - part of body:  (back)                Time: 1410-1459 OT Time Calculation (min): 49 min Charges:  OT General Charges $OT Visit: 1 Visit OT Evaluation $OT Eval Moderate Complexity: 1 Mod OT Treatments $Self Care/Home Management : 8-22 mins  Danell Vazquez M. [place], OTR/L [facility] Acute Rehabilitation Services [phone] Secure Chat Preferred  Jung Yurchak 12/11/2023, 4:35 PM

## 2023-12-12 DIAGNOSIS — J441 Chronic obstructive pulmonary disease with (acute) exacerbation: Secondary | ICD-10-CM | POA: Diagnosis not present

## 2023-12-12 MED ORDER — ALBUTEROL SULFATE HFA 108 (90 BASE) MCG/ACT IN AERS
1.0000 | INHALATION_SPRAY | RESPIRATORY_TRACT | Status: DC | PRN
  Filled 2023-12-12: qty 6.7

## 2023-12-12 MED ORDER — INFLUENZA VAC SPLIT HIGH-DOSE 0.5 ML IM SUSY
0.5000 mL | PREFILLED_SYRINGE | INTRAMUSCULAR | Status: AC
  Administered 2023-12-13: 0.5 mL via INTRAMUSCULAR
  Filled 2023-12-12: qty 0.5

## 2023-12-12 MED ORDER — ENSURE PLUS HIGH PROTEIN PO LIQD
237.0000 mL | Freq: Two times a day (BID) | ORAL | Status: DC
  Administered 2023-12-12 – 2023-12-13 (×2): 237 mL via ORAL

## 2023-12-12 NOTE — Progress Notes (Signed)
 PROGRESS NOTE  April [name] FMW:[id number] DOB: 1955/02/06 DOA: 12/08/2023 PCP: Patient, No Pcp Per   LOS: 3 days   Brief Narrative / Interim history: 68 year old female with COPD, chronic hypoxemia on 2 L at home as needed, Parkinson's disease, CKD 3A, HTN, PMR, comes into the hospital with shortness of breath, wheezing dry cough, admitted with COPD exacerbation.  She also had a fall prior to admission and was found to have an L2 compression fracture.  She has been complaining of significant back pain.  Subjective / 24h Interval events: Breathing is much better overall, but she is extremely concerned about returning to her independent living facility given how many falls she has had in the last several weeks and the fact that she has no help.  She is concerned about cost if she were to go to an SNF.  Assesement and Plan: Principal problem COPD exacerbation, chronic hypoxic respiratory failure -she had significant wheezing on admission, was started on steroid, nebulizers as well as azithromycin .  She was transition to oral steroids - She did not feel like the nebulizers are helping, switch back to Breztri  and albuterol  inhalers - Viral workup negative - Medically stable, PT recommends SNF, she initially  wanted to go home but now reconsidering given how many falls she has had recently and her overall decline in the last year.  She is looking to transition to ALF potentially after SNF  Active problems L2 vertebral compression fracture -following a fall.  She has significant pain.  IR consulted, status post kyphoplasty 12/5.  Continue pain control/PT  Parkinson's disease-continue carbidopa /levodopa   Chronic systolic CHF -most recent 2D echo showed improved EF at 50-55% now, grade 1 diastolic dysfunction.  Euvolemic  Essential hypertension-continue metoprolol , blood pressure is acceptable  CKD 3A-creatinine stable, at baseline  Hyperlipidemia-continue  Crestor   Anxiety/depression-stable, continue BuSpar  Right radial and ulnar fractures -about a month ago, this is managed nonoperatively, followed by orthopedics as an outpatient.  Hypothyroidism-continue Synthroid   Scheduled Meds:  benzonatate   100 mg Oral TID   budesonide -glycopyrrolate -formoterol   2 puff Inhalation Q12H   buPROPion   300 mg Oral Daily   calcium -vitamin D   2 tablet Oral Q supper   carbidopa -levodopa   1 tablet Oral TID   enoxaparin  (LOVENOX ) injection  40 mg Subcutaneous Q24H   levothyroxine   88 mcg Oral Q0600   methylPREDNISolone  (SOLU-MEDROL ) injection  80 mg Intravenous Q24H   metoprolol  succinate  25 mg Oral QHS   rosuvastatin   10 mg Oral QHS   sodium chloride  flush  3 mL Intravenous Q12H   sodium chloride  flush  3 mL Intravenous Q12H   Continuous Infusions:   PRN Meds:.acetaminophen  **OR** acetaminophen , albuterol , HYDROcodone -acetaminophen , metoprolol  tartrate, ondansetron  **OR** ondansetron  (ZOFRAN ) IV, potassium chloride , senna, sodium chloride  flush, tiZANidine   Current Outpatient Medications  Medication Instructions   acetaminophen  (TYLENOL ) 1,000 mg, 2 times daily PRN   albuterol  (VENTOLIN  HFA) 108 (90 Base) MCG/ACT inhaler 2 puffs, Inhalation, Every 6 hours PRN   azithromycin  (ZITHROMAX ) 250 mg, Oral, Every M-W-F   budesonide -glycopyrrolate -formoterol  (BREZTRI  AEROSPHERE) 160-9-4.8 MCG/ACT AERO inhaler 2 puffs, Inhalation, Every 12 hours   buPROPion  (WELLBUTRIN  XL) 300 mg, Daily   Calcium  Carb-Cholecalciferol  (CALCIUM -VITAMIN D  PO) 1 tablet, Daily   carbidopa -levodopa  (SINEMET  IR) 25-100 MG tablet 1 tablet, Oral, 3 times daily   Cyanocobalamin  (VITAMIN B 12 PO) 1 tablet, Daily at bedtime   diclofenac  (VOLTAREN ) 75 mg, Oral, 2 times daily   furosemide  (LASIX ) 20 mg, Daily PRN   HYDROcodone -acetaminophen  (NORCO/VICODIN) 5-325 MG  tablet 1-2 tablets, Oral, Every 6 hours PRN   ipratropium-albuterol  (DUONEB) 0.5-2.5 (3) MG/3ML SOLN 3 mLs, Every 6 hours  PRN   levothyroxine  (SYNTHROID ) 88 mcg, Daily before breakfast   metoprolol  succinate (TOPROL -XL) 25 mg, Daily at bedtime   Nucala  100 mg, Subcutaneous, Every 28 days   polyethylene glycol (MIRALAX  / GLYCOLAX ) 17 g, Oral, Daily PRN   potassium chloride  (KLOR-CON  M) 10 MEQ tablet 10 mEq, Oral, Daily PRN   rosuvastatin  (CRESTOR ) 10 mg, Oral, Daily at bedtime   SYSTANE ULTRA 0.4-0.3 % SOLN 1 drop, Both Eyes, 2 times daily   tiZANidine  (ZANAFLEX ) 4 mg, Daily PRN   [Paused] valsartan  (DIOVAN ) 40 mg, Daily    Diet Orders (From admission, onward)     Start     Ordered   12/10/23 1808  Diet regular Room service appropriate? Yes; Fluid consistency: Thin  Diet effective now       Question Answer Comment  Room service appropriate? Yes   Fluid consistency: Thin      12/10/23 1807            DVT prophylaxis: enoxaparin  (LOVENOX ) injection 40 mg Start: 12/11/23 1115 Place and maintain sequential compression device Start: 12/09/23 1503 SCDs Start: 12/09/23 0027 Place TED hose Start: 12/09/23 0027   Lab Results  Component Value Date   PLT 169 12/10/2023      Code Status: Full Code  Family Communication: no family at bedside   Status is: Inpatient Remains inpatient appropriate because: severity of illness  Level of care: Telemetry  Consultants:  IR  Objective: Vitals:   12/11/23 2349 12/12/23 0445 12/12/23 0711 12/12/23 0748  BP: 105/60 113/76 134/88   Pulse: 81 77 74   Resp: 14 12 13    Temp: 97.6 F (36.4 C) 97.6 F (36.4 C) 98.1 F (36.7 C)   TempSrc: Oral Oral Oral   SpO2: 96% 96% 95% 95%  Weight:      Height:        Intake/Output Summary (Last 24 hours) at 12/12/2023 1027 Last data filed at 12/12/2023 9385 Gross per 24 hour  Intake 480 ml  Output 400 ml  Net 80 ml   Wt Readings from Last 3 Encounters:  12/09/23 63.2 kg  11/16/23 68 kg  11/01/23 68 kg    Examination:  Constitutional: NAD Eyes: lids and conjunctivae normal, no scleral icterus ENMT:  mmm Neck: normal, supple Respiratory: clear to auscultation bilaterally, no wheezing, no crackles. Normal respiratory effort.  Cardiovascular: Regular rate and rhythm, no murmurs / rubs / gallops. No LE edema. Abdomen: soft, no distention, no tenderness. Bowel sounds positive.     Data Reviewed: I have independently reviewed following labs and imaging studies   CBC Recent Labs  Lab 12/08/23 1645 12/08/23 1718 12/09/23 0258 12/10/23 0221  WBC 11.4*  --  7.8 9.9  HGB 13.0 9.9* 11.1* 10.1*  HCT 39.9 29.0* 34.9* 30.4*  PLT 194  --  173 169  MCV 95.0  --  97.2 93.8  MCH 31.0  --  30.9 31.2  MCHC 32.6  --  31.8 33.2  RDW 14.7  --  14.7 14.9    Recent Labs  Lab 12/08/23 1645 12/08/23 1718 12/08/23 1754 12/08/23 1905 12/08/23 1934 12/09/23 0258 12/10/23 0221  NA 139 136  --   --   --  135  --   K 4.2 3.8  --   --   --  4.1  --   CL 100  --   --   --   --  101  --   CO2 23  --   --   --   --  22  --   GLUCOSE 122*  --   --   --   --  204*  --   BUN 26*  --   --   --   --  26*  --   CREATININE 1.46*  --   --   --   --  1.29*  --   CALCIUM  9.4  --   --   --   --  8.7*  --   AST  --   --   --   --   --  27  --   ALT  --   --   --   --   --  27  --   ALKPHOS  --   --   --   --   --  97  --   BILITOT  --   --   --   --   --  0.8  --   ALBUMIN  --   --   --   --   --  3.2*  --   LATICACIDVEN  --  7.1* 1.7 3.0* 2.0*  --   --   INR  --   --   --   --   --   --  1.0  TSH  --   --   --   --   --  1.646  --     ------------------------------------------------------------------------------------------------------------------ No results for input(s): CHOL, HDL, LDLCALC, TRIG, CHOLHDL, LDLDIRECT in the last 72 hours.  Lab Results  Component Value Date   HGBA1C 6.1 11/24/2021   ------------------------------------------------------------------------------------------------------------------ No results for input(s): TSH, T4TOTAL, T3FREE, THYROIDAB in the last  72 hours.  Invalid input(s): FREET3   Cardiac Enzymes No results for input(s): CKMB, TROPONINI, MYOGLOBIN in the last 168 hours.  Invalid input(s): CK ------------------------------------------------------------------------------------------------------------------    Component Value Date/Time   BNP 8.8 09/29/2023 1304    CBG: No results for input(s): GLUCAP in the last 168 hours.  Recent Results (from the past 240 hours)  Culture, blood (routine x 2)     Status: None (Preliminary result)   Collection Time: 12/08/23  4:55 PM   Specimen: BLOOD  Result Value Ref Range Status   Specimen Description BLOOD RIGHT ANTECUBITAL  Final   Special Requests   Final    BOTTLES DRAWN AEROBIC AND ANAEROBIC Blood Culture results may not be optimal due to an inadequate volume of blood received in culture bottles   Culture   Final    NO GROWTH 4 DAYS Performed at [facility], [address] [place], [place] [address]    Report Status PENDING  Incomplete  Culture, blood (routine x 2)     Status: None (Preliminary result)   Collection Time: 12/08/23  5:02 PM   Specimen: BLOOD LEFT FOREARM  Result Value Ref Range Status   Specimen Description BLOOD LEFT FOREARM  Final   Special Requests   Final    BOTTLES DRAWN AEROBIC AND ANAEROBIC Blood Culture results may not be optimal due to an inadequate volume of blood received in culture bottles   Culture   Final    NO GROWTH 4 DAYS Performed at [facility], [address] [place], [place] [address]    Report Status PENDING  Incomplete  Respiratory (~20 pathogens) panel by PCR     Status: None  Collection Time: 12/09/23 12:27 AM   Specimen: Nasopharyngeal Swab; Respiratory  Result Value Ref Range Status   Adenovirus NOT DETECTED NOT DETECTED Final   Coronavirus 229E NOT DETECTED NOT DETECTED Final    Comment: (NOTE) The Coronavirus on the Respiratory Panel, DOES NOT test for the novel  Coronavirus (2019 nCoV)     Coronavirus HKU1 NOT DETECTED NOT DETECTED Final   Coronavirus NL63 NOT DETECTED NOT DETECTED Final   Coronavirus OC43 NOT DETECTED NOT DETECTED Final   Metapneumovirus NOT DETECTED NOT DETECTED Final   Rhinovirus / Enterovirus NOT DETECTED NOT DETECTED Final   Influenza A NOT DETECTED NOT DETECTED Final   Influenza B NOT DETECTED NOT DETECTED Final   Parainfluenza Virus 1 NOT DETECTED NOT DETECTED Final   Parainfluenza Virus 2 NOT DETECTED NOT DETECTED Final   Parainfluenza Virus 3 NOT DETECTED NOT DETECTED Final   Parainfluenza Virus 4 NOT DETECTED NOT DETECTED Final   Respiratory Syncytial Virus NOT DETECTED NOT DETECTED Final   Bordetella pertussis NOT DETECTED NOT DETECTED Final   Bordetella Parapertussis NOT DETECTED NOT DETECTED Final   Chlamydophila pneumoniae NOT DETECTED NOT DETECTED Final   Mycoplasma pneumoniae NOT DETECTED NOT DETECTED Final    Comment: Performed at [facility], [address] [place], [place] [address]  SARS Coronavirus 2 by RT PCR (hospital order, performed in [facility] hospital lab) *cepheid single result test* Anterior Nasal Swab     Status: None   Collection Time: 12/09/23 10:41 AM   Specimen: Anterior Nasal Swab  Result Value Ref Range Status   SARS Coronavirus 2 by RT PCR NEGATIVE NEGATIVE Final    Comment: Performed at [facility], [address], [place], [place] [address]     Radiology Studies: No results found.   Nilda Fendt, MD, PhD [facility]  Between 7 am - 7 pm I am available, please contact me via Amion (for emergencies) or Securechat (non urgent messages)  Between 7 pm - 7 am I am not available, please contact night coverage MD/APP via Amion

## 2023-12-12 NOTE — TOC Progression Note (Signed)
 Transition of Care [facility]) - Progression Note    Patient Details  Name: April [name] MRN: [id number] Date of Birth: [birth year]  [facility] CM/SW Contact  Almarie CHRISTELLA Goodie, [place] Phone Number: 12/12/2023, 11:41 AM  Clinical Narrative:   Patient now in agreement to consider SNF, she is concerned about returning to IDL without assistance. Referral completed and faxed out, awaiting bed offers. CSW to follow.    Expected Discharge Plan: Skilled Nursing Facility Barriers to Discharge: Continued Medical Work up, English As A Second Language Teacher               Expected Discharge Plan and Services   Discharge Planning Services: CM Consult Post Acute Care Choice: NA Living arrangements for the past 2 months: Independent Living Facility                   DME Agency: NA       HH Arranged: NA           Social Drivers of Health (SDOH) Interventions SDOH Screenings   Food Insecurity: No Food Insecurity (12/09/2023)  Housing: Low Risk  (12/09/2023)  Recent Concern: Housing - High Risk (09/30/2023)  Transportation Needs: Unmet Transportation Needs (12/09/2023)  Utilities: Not At Risk (12/09/2023)  Alcohol  Screen: Low Risk  (11/26/2021)  Depression (PHQ2-9): Low Risk  (04/08/2023)  Financial Resource Strain: Medium Risk (12/01/2022)  Physical Activity: Insufficiently Active (12/01/2022)  Social Connections: Moderately Isolated (12/09/2023)  Stress: No Stress Concern Present (12/01/2022)  Tobacco Use: Medium Risk (12/09/2023)  Health Literacy: Adequate Health Literacy (12/01/2022)    Readmission Risk Interventions    12/09/2023    1:31 PM  Readmission Risk Prevention Plan  Transportation Screening Complete  HRI or Home Care Consult Complete  Palliative Care Screening Not Applicable  Medication Review (RN Care Manager) Complete

## 2023-12-12 NOTE — Plan of Care (Signed)

## 2023-12-12 NOTE — NC FL2 (Signed)
 [place]  MEDICAID FL2 LEVEL OF CARE FORM     IDENTIFICATION  Patient Name: April [name] Birthdate: [birth year] Sex: female Admission Date (Current Location): 12/08/2023  [facility] and [place] Number:  Producer, Television/film/video and Address:  [facility], [address] [place], [place] [address]      Provider Number: [id number]  Attending Physician Name and Address:  Trixie Nilda CHRISTELLA, MD  Relative Name and Phone Number:  Milligan(POA),Jasper (Son) [phone] (Mobile)    Current Level of Care: Hospital Recommended Level of Care: Skilled Nursing Facility Prior Approval Number:    Date Approved/Denied:   PASRR Number: [id number]  Discharge Plan: SNF    Current Diagnoses: Patient Active Problem List   Diagnosis Date Noted   Lumbar vertebral fracture (HCC) 12/09/2023   Chronic hypoxic respiratory failure (HCC) 12/09/2023   Generalized anxiety disorder 12/09/2023   Lactic acidosis-secondary to respiratory distress 12/09/2023   COPD exacerbation (HCC) 10/01/2023   AKI (acute kidney injury) 09/30/2023   Non-healing wound of lower extremity 09/30/2023   COPD with acute exacerbation (HCC) 09/29/2023   Pelvic fracture (HCC) 08/17/2023   Acute exacerbation of COPD with asthma (HCC) 07/24/2023   Acute exacerbation of chronic obstructive pulmonary disease (COPD) (HCC) 07/23/2023   Atrophy of skin due to systemic corticosteroid 04/08/2023   Parkinson's disease with dyskinesia and fluctuating manifestations (HCC) 04/08/2023   Bilateral lower extremity edema 05/27/2022   Frequent falls 03/05/2022   Serrated polyp of colon 03/05/2022   Hyperlipidemia 12/03/2021   Unsteady gait 07/20/2021   Chronic diastolic CHF (congestive heart failure) (HCC) 06/13/2021   Upper airway cough syndrome 05/07/2021   Osteoporosis 03/23/2021   Allergic rhinitis 04/06/2019   Aortic atherosclerosis 12/17/2017   Pulmonary nodule 12/17/2017   GERD (gastroesophageal reflux  disease) 12/12/2017   COPD, severe (HCC) 11/24/2017   CKD (chronic kidney disease) stage 3, GFR 30-59 ml/min (HCC) 10/28/2017   Vertigo 10/27/2017   Essential hypertension 06/23/2017   Polymyalgia rheumatica 05/22/2016   History of bilateral hip replacements 05/22/2016   DDD (degenerative disc disease), lumbar 05/22/2016   History of avascular necrosis of capital femoral epiphysis 08/14/2014   Asthma 03/23/2010   Acquired hypothyroidism 03/21/2010   Chronic recurrent major depressive disorder 03/21/2010   Former smoker 03/21/2010   History of temporal arteritis 03/21/2010    Orientation RESPIRATION BLADDER Height & Weight     Self, Time, Situation, Place  O2 ([place] 2L) Incontinent Weight: 139 lb 5.3 oz (63.2 kg) Height:  5' 6.5 (168.9 cm)  BEHAVIORAL SYMPTOMS/MOOD NEUROLOGICAL BOWEL NUTRITION STATUS      Continent Diet (regular)  AMBULATORY STATUS COMMUNICATION OF NEEDS Skin   Extensive Assist Verbally Normal                       Personal Care Assistance Level of Assistance  Bathing, Feeding, Dressing Bathing Assistance: Maximum assistance Feeding assistance: Independent Dressing Assistance: Maximum assistance     Functional Limitations Info  Sight, Hearing, Speech Sight Info: Adequate Hearing Info: Adequate Speech Info: Adequate    SPECIAL CARE FACTORS FREQUENCY  PT (By licensed PT), OT (By licensed OT)     PT Frequency: 5x/wk OT Frequency: 5x/wk            Contractures Contractures Info: Not present    Additional Factors Info  Code Status, Allergies, Psychotropic Code Status Info: Full Allergies Info: Sulfa Antibiotics Sulfonamide Derivatives Dilaudid  (Hydromorphone  Hcl) Fosamax (Alendronate Sodium) Molds & Smuts Pollen Extract Wound Dressing  Adhesive Psychotropic Info: Wellbutrin  XL 300 mg daily, Sinemet  IR 1 tab 3x/day         Current Medications (12/12/2023):  This is the current hospital active medication list Current Facility-Administered  Medications  Medication Dose Route Frequency Provider Last Rate Last Admin   acetaminophen  (TYLENOL ) tablet 650 mg  650 mg Oral Q6H PRN Sundil, Subrina, MD       Or   acetaminophen  (TYLENOL ) suppository 650 mg  650 mg Rectal Q6H PRN Sundil, Subrina, MD       albuterol  (PROVENTIL ) (2.5 MG/3ML) 0.083% nebulizer solution 3 mL  3 mL Inhalation Q6H PRN Gherghe, Costin M, MD       benzonatate  (TESSALON ) capsule 100 mg  100 mg Oral TID Sundil, Subrina, MD   100 mg at 12/12/23 1028   budesonide -glycopyrrolate -formoterol  (BREZTRI ) 160-9-4.8 MCG/ACT inhaler 2 puff  2 puff Inhalation Q12H Gherghe, Costin M, MD   2 puff at 12/12/23 0747   buPROPion  (WELLBUTRIN  XL) 24 hr tablet 300 mg  300 mg Oral Daily Sundil, Subrina, MD   300 mg at 12/12/23 1028   calcium -vitamin D  (OSCAL WITH D) 500-5 MG-MCG per tablet 2 tablet  2 tablet Oral Q supper Gonfa, Taye T, MD   2 tablet at 12/11/23 1722   carbidopa -levodopa  (SINEMET  IR) 25-100 MG per tablet immediate release 1 tablet  1 tablet Oral TID Gonfa, Taye T, MD   1 tablet at 12/12/23 1028   enoxaparin  (LOVENOX ) injection 40 mg  40 mg Subcutaneous Q24H Paytes, Austin A, RPH   40 mg at 12/12/23 1028   HYDROcodone -acetaminophen  (NORCO/VICODIN) 5-325 MG per tablet 1 tablet  1 tablet Oral Q6H PRN Sundil, Subrina, MD   1 tablet at 12/11/23 2115   levothyroxine  (SYNTHROID ) tablet 88 mcg  88 mcg Oral Q0600 Sundil, Subrina, MD   88 mcg at 12/12/23 9387   methylPREDNISolone  sodium succinate (SOLU-MEDROL ) 125 mg/2 mL injection 80 mg  80 mg Intravenous Q24H Sundil, Subrina, MD   80 mg at 12/11/23 1722   metoprolol  succinate (TOPROL -XL) 24 hr tablet 25 mg  25 mg Oral QHS Sundil, Subrina, MD   25 mg at 12/11/23 2115   metoprolol  tartrate (LOPRESSOR ) injection 2.5 mg  2.5 mg Intravenous Q6H PRN Sundil, Subrina, MD       ondansetron  (ZOFRAN ) tablet 4 mg  4 mg Oral Q6H PRN Sundil, Subrina, MD       Or   ondansetron  (ZOFRAN ) injection 4 mg  4 mg Intravenous Q6H PRN Sundil, Subrina, MD        potassium chloride  (KLOR-CON  M) CR tablet 10 mEq  10 mEq Oral Daily PRN Gonfa, Taye T, MD       rosuvastatin  (CRESTOR ) tablet 10 mg  10 mg Oral QHS Sundil, Subrina, MD   10 mg at 12/11/23 2114   senna (SENOKOT) tablet 8.6-17.2 mg  1-2 tablet Oral Daily PRN Opyd, Timothy S, MD   17.2 mg at 12/11/23 2114   sodium chloride  flush (NS) 0.9 % injection 3 mL  3 mL Intravenous Q12H Sundil, Subrina, MD   3 mL at 12/11/23 0850   sodium chloride  flush (NS) 0.9 % injection 3 mL  3 mL Intravenous Q12H Sundil, Subrina, MD   3 mL at 12/12/23 1029   sodium chloride  flush (NS) 0.9 % injection 3 mL  3 mL Intravenous PRN Sundil, Subrina, MD       tiZANidine  (ZANAFLEX ) tablet 4 mg  4 mg Oral Daily PRN Kathrin Mignon DASEN, MD  Discharge Medications: Please see discharge summary for a list of discharge medications.  Relevant Imaging Results:  Relevant Lab Results:   Additional Information SS#: [id number]  April CHRISTELLA Goodie, LCSW

## 2023-12-12 NOTE — Plan of Care (Signed)
   Problem: Education: Goal: Knowledge of General Education information will improve Description: Including pain rating scale, medication(s)/side effects and non-pharmacologic comfort measures Outcome: Progressing   Problem: Activity: Goal: Risk for activity intolerance will decrease Outcome: Progressing   Problem: Nutrition: Goal: Adequate nutrition will be maintained Outcome: Progressing

## 2023-12-12 NOTE — Progress Notes (Signed)
 Referring Physician(s): Gonfa,T  Supervising Physician: Hughes Simmonds  Patient Status:   [facility] IP  Chief Complaint:  Back pain, acute L2 fracture; s/p L2 kyphoplasty 12/5   Subjective:  Pt states that she feels better since KP ; she has been able to get out of bed with less back pain, working with PT; only has mild soreness now at puncture site  Allergies: Sulfa antibiotics, Sulfonamide derivatives, Dilaudid  [hydromorphone  hcl], Fosamax [alendronate sodium], Molds & smuts, Pollen extract, Sertraline , and Wound dressing adhesive  Medications: Prior to Admission medications   Medication Sig Start Date End Date Taking? Authorizing Provider  acetaminophen  (TYLENOL ) 500 MG tablet Take 1,000 mg by mouth 2 (two) times daily as needed for headache or fever (pain).   Yes [provider]  albuterol  (VENTOLIN  HFA) 108 (90 Base) MCG/ACT inhaler Inhale 2 puffs into the lungs every 6 (six) hours as needed for wheezing or shortness of breath. 10/06/23  Yes [name], NP  budesonide -glycopyrrolate -formoterol  (BREZTRI  AEROSPHERE) 160-9-4.8 MCG/ACT AERO inhaler Inhale 2 puffs into the lungs every 12 (twelve) hours. 10/06/23  Yes [name], NP  buPROPion  (WELLBUTRIN  XL) 300 MG 24 hr tablet Take 300 mg by mouth daily.   Yes [provider]  Calcium  Carb-Cholecalciferol  (CALCIUM -VITAMIN D  PO) Take 1 tablet by mouth daily.   Yes [provider]  carbidopa -levodopa  (SINEMET  IR) 25-100 MG tablet TAKE 1 TABLET BY MOUTH 3 TIMES A DAY 08/03/23  Yes Tat, Rebecca S, DO  Cyanocobalamin  (VITAMIN B 12 PO) Take 1 tablet by mouth at bedtime.   Yes [provider]  furosemide  (LASIX ) 20 MG tablet Take 20 mg by mouth daily as needed for fluid.   Yes [provider]  HYDROcodone -acetaminophen  (NORCO/VICODIN) 5-325 MG tablet Take 1-2 tablets by mouth every 6 (six) hours as needed. 11/18/23  Yes [name], MD  ipratropium-albuterol  (DUONEB) 0.5-2.5 (3)  MG/3ML SOLN Take 3 mLs by nebulization every 6 (six) hours as needed (shortness of breath).   Yes [provider]  levothyroxine  (SYNTHROID ) 88 MCG tablet Take 88 mcg by mouth daily before breakfast.   Yes [provider]  Mepolizumab  (NUCALA ) 100 MG/ML SOAJ Inject 1 mL (100 mg total) into the skin every 28 (twenty-eight) days. 10/06/23  Yes Jude Harden GAILS, MD  metoprolol  succinate (TOPROL -XL) 25 MG 24 hr tablet Take 25 mg by mouth at bedtime.   Yes [provider]  potassium chloride  (KLOR-CON  M) 10 MEQ tablet Take 1 tablet (10 mEq total) by mouth daily as needed (when you take the lasix ). 03/01/23  Yes [name], MD  rosuvastatin  (CRESTOR ) 10 MG tablet Take 1 tablet (10 mg total) by mouth at bedtime. 03/01/23  Yes [name], MD  SYSTANE ULTRA 0.4-0.3 % SOLN Place 1 drop into both eyes 2 (two) times daily. 09/30/23  Yes [name], [name], DO  tiZANidine  (ZANAFLEX ) 4 MG tablet Take 4 mg by mouth daily as needed (severe muscle spasms).   Yes [provider]  valsartan  (DIOVAN ) 40 MG tablet Take 40 mg by mouth daily.   Yes [provider]  azithromycin  (ZITHROMAX ) 250 MG tablet Take 1 tablet (250 mg total) by mouth every Monday, Wednesday, and Friday. Patient not taking: Reported on 11/17/2023 10/06/23 09/06/24  [name], NP  diclofenac  (VOLTAREN ) 75 MG EC tablet Take 1 tablet (75 mg total) by mouth 2 (two) times daily for 15 days. Patient not taking: Reported on 12/08/2023 12/06/23 12/21/23  Zelaya, Oscar A, PA-C  polyethylene glycol (  MIRALAX  / GLYCOLAX ) 17 g packet Take 17 g by mouth daily as needed for moderate constipation. Patient not taking: Reported on 11/17/2023 09/30/23   [name], Sallyanne, DO     Vital Signs: BP 134/88 (BP Location: Left Arm)   Pulse 74   Temp 98.1 F (36.7 C) (Oral)   Resp 13   Ht 5' 6.5 (1.689 m)   Wt 139 lb 5.3 oz (63.2 kg)   SpO2 95%   BMI 22.15 kg/m   Physical Exam; awake/alert; puncture site  L2 region clean and dry, mildly tender; moving all fours ok; no new neurological changes  Imaging: IR KYPHO LUMBAR INC FX REDUCE BONE BX UNI/BIL CANNULATION INC/IMAGING Result Date: 12/10/2023 INDICATION: Symptomatic L2 compression fracture status post falling from a standing height. EXAM: Fluoroscopy for bone augmentation at L2 COMPARISON:  None Available. MEDICATIONS: As antibiotic prophylaxis, 2 g Ancef  was ordered pre-procedure and administered intravenously within 1 hour of incision. All current medications are in the EMR and have been reviewed as part of this encounter. ANESTHESIA/SEDATION: Moderate (conscious) sedation was employed during this procedure. A total of Versed  2 mg and Fentanyl  100 mcg was administered intravenously by the radiology nurse. Total intra-service moderate Sedation Time: 36 minutes. The patient's level of consciousness and vital signs were monitored continuously by radiology nursing throughout the procedure under my direct supervision. FLUOROSCOPY: Radiation Exposure Index (as provided by the fluoroscopic device): 938 mGy Kerma COMPLICATIONS: None immediate. PROCEDURE: Following a full explanation of the procedure along with the potential associated complications, an informed witnessed consent was obtained. The patient was placed prone on the fluoroscopic table. The skin overlying the lumbar region region was then prepped and draped in the usual sterile fashion. The right pedicle at L2 was then infiltrated with 0.25% bupivacaine  followed by the advancement of an 11-gauge Jamshidi needle through the right pedicle into the posterior one-third at L2. This was then exchanged for a Kyphon advanced osteo introducer system comprised of a working cannula and a Kyphon osteo drill. This combination was then advanced over a Kyphon osteo bone pin until the tip of the Kyphon osteo drill was in the posterior third at L2. At this time, the bone pin was removed. In a medial trajectory, the  combination was advanced until the tip of the working cannula was inside the posterior one-third at L2. Through the working cannula, a Kyphon bone biopsy device was advanced to within 5 mm of the anterior aspect of L2. Through the working cannula, a Kyphon inflatable bone balloon was advanced and positioned with the distal marker 5 mm from the anterior aspect of L2. Crossing of the midline was seen on the AP projection. At this time, the balloon was expanded using contrast via a Kyphon inflation syringe device via microtubing. Inflations were continued until there was apposition with the superior and the inferior endplates. At this time, methylmethacrylate mixture was reconstituted with Tobramycin in the Kyphon bone mixing device system. This was then loaded onto the Kyphon bone fillers. The balloon was deflated and removed followed by the instillation of bone filler equivalents of methylmethacrylate mixture at L2 with excellent filling in the AP and lateral projections. No extravasation was noted in the disk spaces or posteriorly into the spinal canal. No epidural venous contamination was seen. The working cannula and the bone filler were then retrieved and removed. IMPRESSION: 1. Status post fluoroscopic-guided needle for vertebral body augmentation using balloon kyphoplasty at L2 as described without event. If the patient has known osteoporosis, recommend  treatment as clinically indicated. If the patient's bone density status is unknown, DEXA scan is recommended. Electronically Signed   By: Cordella Banner   On: 12/10/2023 15:56   CT Lumbar Spine Wo Contrast Result Date: 12/09/2023 EXAM: CT OF THE LUMBAR SPINE WITHOUT CONTRAST 12/09/2023 12:59:03 AM TECHNIQUE: CT of the lumbar spine was performed without the administration of intravenous contrast. Multiplanar reformatted images are provided for review. Automated exposure control, iterative reconstruction, and/or weight based adjustment of the mA/kV was  utilized to reduce the radiation dose to as low as reasonably achievable. COMPARISON: 12/17/2023 CLINICAL HISTORY: Back trauma, no prior imaging (Age >= 16y) FINDINGS: BONES AND ALIGNMENT: There is a new incomplete burst fracture of L2 with approximately 50% central height loss and 2 mm of retropulsion. Other visualized vertebral body heights appear maintained. Alignment is normal except for the retropulsion at L2. No suspicious bone lesion. DEGENERATIVE CHANGES: No significant degenerative changes. SOFT TISSUES: Calcific aortic atherosclerosis. IMPRESSION: 1. New incomplete burst fracture of L2 with approximately 50% central height loss and 2 mm of retropulsion. Electronically signed by: Franky Stanford MD 12/09/2023 01:09 AM EST RP Workstation: [id number]   CT Angio Chest PE W and/or Wo Contrast Result Date: 12/08/2023 EXAM: CTA of the Chest with and without contrast for PE 12/08/2023 10:04:24 PM TECHNIQUE: CTA of the chest was performed after the administration of 75 mL of iohexol  (OMNIPAQUE ) 350 MG/ML injection. Multiplanar reformatted images are provided for review. MIP images are provided for review. Automated exposure control, iterative reconstruction, and/or weight based adjustment of the mA/kV was utilized to reduce the radiation dose to as low as reasonably achievable. COMPARISON: Remote prior examination 12/14/2022. CLINICAL HISTORY: Pulmonary embolism (PE) suspected, high prob. FINDINGS: PULMONARY ARTERIES: Pulmonary arteries are adequately opacified for evaluation. No pulmonary embolism. Main pulmonary artery is normal in caliber. MEDIASTINUM: The heart and pericardium demonstrate no acute abnormality. Mild atherosclerotic calcification within the thoracic aorta. LYMPH NODES: No mediastinal, hilar or axillary lymphadenopathy. LUNGS AND PLEURA: Severe emphysema. Motion artifact slightly limits evaluation of the pulmonary parenchyma. 5 mm noncalcified pulmonary nodule within the right apex (23/6) stable  since remote prior examination 12/14/2022. No pneumothorax or pleural effusion. UPPER ABDOMEN: Status post cholecystectomy. Limited images of the upper abdomen are otherwise unremarkable. SOFT TISSUES AND BONES: Segmentation anomaly with incomplete segmentation T12-L1. Superior endplate fracture L2 appears acute, and appears new from, it is incompletely included on this examination and not well assessed. IMPRESSION: 1. No pulmonary embolism. 2. Suspected acute superior endplate fracture of L2, new from prior examination, incompletely included and not well assessed. Correlation for point tenderness and possible dedicated CT imaging of the lumbar spine is recommended for further evaluation. 3. Severe emphysema. Electronically signed by: Dorethia Molt MD 12/08/2023 11:23 PM EST RP Workstation: [id number]   DG Chest Portable 1 View Result Date: 12/08/2023 EXAM: 1 VIEW(S) XRAY OF THE CHEST 12/08/2023 04:59:10 PM COMPARISON: 09/29/2023 CLINICAL HISTORY: COPD/SHOB FINDINGS: LUNGS AND PLEURA: Stable hyperinflation of the lungs. No focal pulmonary opacity. No pleural effusion. No pneumothorax. HEART AND MEDIASTINUM: No acute abnormality of the cardiac and mediastinal silhouettes. BONES AND SOFT TISSUES: Status post left shoulder arthroplasty. No acute osseous abnormality. IMPRESSION: 1. Stable hyperinflation of the lungs, compatible with COPD. Electronically signed by: Lynwood Seip MD 12/08/2023 05:42 PM EST RP Workstation: [id number]    Labs:  CBC: Recent Labs    11/16/23 1909 12/08/23 1645 12/08/23 1718 12/09/23 0258 12/10/23 0221  WBC 9.2 11.4*  --  7.8 9.9  HGB 11.7* 13.0  9.9* 11.1* 10.1*  HCT 37.0 39.9 29.0* 34.9* 30.4*  PLT 215 194  --  173 169    COAGS: Recent Labs    12/10/23 0221  INR 1.0    BMP: Recent Labs    09/30/23 0206 11/16/23 1909 12/08/23 1645 12/08/23 1718 12/09/23 0258  NA 137 139 139 136 135  K 4.1 4.6 4.2 3.8 4.1  CL 106 102 100  --  101  CO2 23 27 23   --  22   GLUCOSE 129* 126* 122*  --  204*  BUN 27* 23 26*  --  26*  CALCIUM  8.6* 9.3 9.4  --  8.7*  CREATININE 1.58* 1.32* 1.46*  --  1.29*  GFRNONAA 35* 43* 38*  --  45*    LIVER FUNCTION TESTS: Recent Labs    08/16/23 1057 09/29/23 1304 12/09/23 0258  BILITOT 0.6 0.9 0.8  AST 19 21 27   ALT 18 16 27   ALKPHOS 111 131* 97  PROT 6.6 5.9* 6.0*  ALBUMIN 3.2* 3.2* 3.2*    Assessment and Plan: Patient with history of symptomatic acute L2 compression fracture post fall, status post L2 kyphoplasty on 12/5; currently stable and with improved back pain, afebrile, no new labs; continue PT; further plans as per primary team   Electronically Signed: D. Franky Rakers, PA-C 12/12/2023, 10:09 AM   I spent a total of 15 Minutes at the the patient's bedside AND on the patient's hospital floor or unit, greater than 50% of which was counseling/coordinating care for L (lumbar)2 kyphoplasty    Patient ID: April [name], female   DOB: 1955-03-27, 68 y.o.   MRN: [id number]

## 2023-12-13 ENCOUNTER — Other Ambulatory Visit (HOSPITAL_COMMUNITY): Payer: Self-pay

## 2023-12-13 ENCOUNTER — Inpatient Hospital Stay (HOSPITAL_COMMUNITY)

## 2023-12-13 DIAGNOSIS — J441 Chronic obstructive pulmonary disease with (acute) exacerbation: Secondary | ICD-10-CM | POA: Diagnosis not present

## 2023-12-13 LAB — CULTURE, BLOOD (ROUTINE X 2)
Culture: NO GROWTH
Culture: NO GROWTH

## 2023-12-13 MED ORDER — PREDNISONE 20 MG PO TABS
40.0000 mg | ORAL_TABLET | Freq: Every day | ORAL | Status: DC
  Administered 2023-12-13: 40 mg via ORAL
  Filled 2023-12-13: qty 2

## 2023-12-13 MED ORDER — GUAIFENESIN-DM 100-10 MG/5ML PO SYRP
5.0000 mL | ORAL_SOLUTION | ORAL | Status: DC | PRN
  Administered 2023-12-13: 5 mL via ORAL
  Filled 2023-12-13: qty 5

## 2023-12-13 MED ORDER — GUAIFENESIN-DM 100-10 MG/5ML PO SYRP
5.0000 mL | ORAL_SOLUTION | ORAL | 0 refills | Status: AC | PRN
  Filled 2023-12-13: qty 118, 4d supply, fill #0

## 2023-12-13 MED ORDER — PREDNISONE 10 MG PO TABS
ORAL_TABLET | ORAL | 0 refills | Status: AC
  Filled 2023-12-13: qty 12, 6d supply, fill #0

## 2023-12-13 MED ORDER — HYDROCODONE-ACETAMINOPHEN 5-325 MG PO TABS
1.0000 | ORAL_TABLET | Freq: Four times a day (QID) | ORAL | 0 refills | Status: DC | PRN
  Filled 2023-12-13: qty 10, 2d supply, fill #0

## 2023-12-13 NOTE — Evaluation (Signed)
 Modified Barium Swallow Study  Patient Details  Name: April [name] MRN: [id number] Date of Birth: 1955-03-06  Today's Date: 12/13/2023  Modified Barium Swallow completed.  Full report located under Chart Review in the Imaging Section.  History of Present Illness Pt is a 68 y.o. F presenting to [facility] on 12/08/23 with SOB; admitted with COPD exacerbation and L2 fx (pending surgery). PMH is significant for COPD, chronic hypoxic respiratory failure on 2L, HTN, HLD, CKD, hypothyroidism, anxiety and depression. Recently diagnosed with PSP.   Clinical Impression Patient presents with oropharyngeal swallowing function that appears [facility] across all tested consistencies. No oral or pharyngeal inefficiency, safety, or timing deficits noted. No penetration/aspiration across all presentations. Noted prominent cricopharyngeous which may contribute to intermittent difficulty swallowing as reported by patient. Note, patient fully upright for study and recommend patient ensure fully upright position for all meals. Patient reports recent diagnosis of PSP, and SLP encouraged patient to closely monitor swallowing function for changes and reach out to neurologist if changes are appreciated to obtain an updated MBS as appropriate across progression of disease. Patient agreeable to all information. At this time, recommend continuation of regular/thin liquid diet with medications administered whole with thin liquids.  Factors that may increase risk of adverse event in presence of aspiration Noe & Lianne 2021):    Swallow Evaluation Recommendations Recommendations: PO diet PO Diet Recommendation: Regular;Thin liquids (Level 0) Liquid Administration via: Straw;Cup Medication Administration: Whole meds with liquid Supervision: Patient able to self-feed Postural changes: Position pt fully upright for meals Oral care recommendations: Oral care BID (2x/day)  Rosina Downy, M.A., CCC-SLP   Rosina A  Zalmen Wrightsman 12/13/2023,1:34 PM

## 2023-12-13 NOTE — TOC Progression Note (Addendum)
 Transition of Care [facility]) - Progression Note    Patient Details  Name: April [name] MRN: [id number] Date of Birth: 1955-09-27  [facility] CM/SW Contact  Luise JAYSON Pan, [place] Phone Number: 12/13/2023, 9:54 AM  Clinical Narrative:   CSW followed up with patient at bedside with medicare.gov ratings for accepting SNF bed offers. Patient stated she does not want to go to SNF, she wants to go home. Patient informed CSW that she is on the waitlist for ALF at her IDL [facility]). Patient stated she uses [facility] and has a home aid for 8 hrs a day. CSW notified RNCM.  CSW will continue to follow.    Expected Discharge Plan: Skilled Nursing Facility Barriers to Discharge: Continued Medical Work up, English As A Second Language Teacher               Expected Discharge Plan and Services   Discharge Planning Services: CM Consult Post Acute Care Choice: NA Living arrangements for the past 2 months: Independent Living Facility                   DME Agency: NA       HH Arranged: NA           Social Drivers of Health (SDOH) Interventions SDOH Screenings   Food Insecurity: No Food Insecurity (12/09/2023)  Housing: Low Risk  (12/09/2023)  Recent Concern: Housing - High Risk (09/30/2023)  Transportation Needs: Unmet Transportation Needs (12/09/2023)  Utilities: Not At Risk (12/09/2023)  Alcohol  Screen: Low Risk  (11/26/2021)  Depression (PHQ2-9): Low Risk  (04/08/2023)  Financial Resource Strain: Medium Risk (12/01/2022)  Physical Activity: Insufficiently Active (12/01/2022)  Social Connections: Moderately Isolated (12/09/2023)  Stress: No Stress Concern Present (12/01/2022)  Tobacco Use: Medium Risk (12/09/2023)  Health Literacy: Adequate Health Literacy (12/01/2022)    Readmission Risk Interventions    12/09/2023    1:31 PM  Readmission Risk Prevention Plan  Transportation Screening Complete  HRI or Home Care Consult Complete  Palliative Care Screening Not  Applicable  Medication Review (RN Care Manager) Complete

## 2023-12-13 NOTE — TOC Transition Note (Addendum)
 Transition of Care [facility]) - Discharge Note   Patient Details  Name: April [name] MRN: [id number] Date of Birth: 25-Sep-1955  Transition of Care [facility]) CM/SW Contact:  Waddell Barnie Rama, RN Phone Number: 12/13/2023, 11:03 AM   Clinical Narrative:    For dc today, she will be going back to her IDL, she is refusing SNF.  MD has spoken to her and she is till refusing SNF.  She  will be getting therapy from [place] onsite.  [facility] states they will bring her w/chair out to meet her when the cab brings her.  Patient wants ptar now, Ptar has been scheduled.     Barriers to Discharge: Continued Medical Work up, English As A Second Language Teacher   Patient Goals and CMS Choice Patient states their goals for this hospitalization and ongoing recovery are:: return to IDL at [facility]   Choice offered to / list presented to : NA      Discharge Placement                       Discharge Plan and Services Additional resources added to the After Visit Summary for     Discharge Planning Services: CM Consult Post Acute Care Choice: NA            DME Agency: NA       HH Arranged: NA          Social Drivers of Health (SDOH) Interventions SDOH Screenings   Food Insecurity: No Food Insecurity (12/09/2023)  Housing: Low Risk  (12/09/2023)  Recent Concern: Housing - High Risk (09/30/2023)  Transportation Needs: Unmet Transportation Needs (12/09/2023)  Utilities: Not At Risk (12/09/2023)  Alcohol  Screen: Low Risk  (11/26/2021)  Depression (PHQ2-9): Low Risk  (04/08/2023)  Financial Resource Strain: Medium Risk (12/01/2022)  Physical Activity: Insufficiently Active (12/01/2022)  Social Connections: Moderately Isolated (12/09/2023)  Stress: No Stress Concern Present (12/01/2022)  Tobacco Use: Medium Risk (12/09/2023)  Health Literacy: Adequate Health Literacy (12/01/2022)     Readmission Risk Interventions    12/09/2023    1:31 PM  Readmission Risk Prevention Plan   Transportation Screening Complete  HRI or Home Care Consult Complete  Palliative Care Screening Not Applicable  Medication Review (RN Care Manager) Complete

## 2023-12-13 NOTE — Discharge Summary (Signed)
 Physician Discharge Summary  April [name] FMW:[id number] DOB: Nov 25, 1955 DOA: 12/08/2023  PCP: Patient, No Pcp Per  Admit date: 12/08/2023 Discharge date: 12/13/2023  Admitted From: home ILF Disposition:  home ILF  Recommendations for Outpatient Follow-up:  Follow up with PCP in 1-2 weeks  Home Health: PT Equipment/Devices: nonw  Discharge Condition: stable CODE STATUS: Full code Diet Orders (From admission, onward)     Start     Ordered   12/10/23 1808  Diet regular Room service appropriate? Yes; Fluid consistency: Thin  Diet effective now       Question Answer Comment  Room service appropriate? Yes   Fluid consistency: Thin      12/10/23 1807           Brief Narrative / Interim history: 68 year old female with COPD, chronic hypoxemia on 2 L at home as needed, Parkinson's disease, CKD 3A, HTN, PMR, comes into the hospital with shortness of breath, wheezing dry cough, admitted with COPD exacerbation.  She also had a fall prior to admission and was found to have an L2 compression fracture.  She has been complaining of significant back pain.  Hospital Course / Discharge diagnoses: Principal problem COPD exacerbation, chronic hypoxic respiratory failure -she had significant wheezing on admission, was started on steroid, nebulizers as well as azithromycin .  She was transition to oral steroids, will have a taper upon discharge.  Wheezing has resolved, respiratory status is back to baseline and will be discharged home in stable condition.  Of note, SNF has been recommended, was initially agreeable to that she later changed her mind and wishes to go home instead.  Home health has been maximized  Active problems L2 vertebral compression fracture -following a fall.  She has significant pain.  IR consulted, status post kyphoplasty 12/5.  Continue pain control/PT  Parkinson's disease-continue carbidopa /levodopa  Chronic systolic CHF -most recent 2D echo showed improved EF at 50-55%  now, grade 1 diastolic dysfunction.  Euvolemic Essential hypertension-continue home medications  CKD 3A-creatinine stable, at baseline Hyperlipidemia-continue Crestor  Anxiety/depression-stable, continue BuSpar Right radial and ulnar fractures -about a month ago, this is managed nonoperatively, followed by orthopedics as an outpatient. Hypothyroidism-continue Synthroid   Sepsis ruled out   Discharge Instructions   Allergies as of 12/13/2023       Reactions   Sulfa Antibiotics Anaphylaxis, Hives, Dermatitis   Sulfonamide Derivatives Anaphylaxis, Swelling, Dermatitis, Rash, Other (See Comments)   Throat closes   Dilaudid  [hydromorphone  Hcl] Nausea And Vomiting   Fosamax [alendronate Sodium] Other (See Comments)   GI intolerence   Molds & Smuts Itching, Other (See Comments)   Sinus and eye irritation   Pollen Extract Itching, Other (See Comments)   Sinus and eye irritation   Sertraline     Made me crazy   Wound Dressing Adhesive Other (See Comments)   Redness Skin tears easily OK to use paper tape        Medication List     PAUSE taking these medications    valsartan  40 MG tablet Wait to take this until your doctor or other care provider tells you to start again. Commonly known as: DIOVAN  Take 40 mg by mouth daily.       TAKE these medications    acetaminophen  500 MG tablet Commonly known as: TYLENOL  Take 1,000 mg by mouth 2 (two) times daily as needed for headache or fever (pain).   albuterol  108 (90 Base) MCG/ACT inhaler Commonly known as: VENTOLIN  HFA Inhale 2 puffs into the lungs every 6 (six)  hours as needed for wheezing or shortness of breath.   azithromycin  250 MG tablet Commonly known as: ZITHROMAX  Take 1 tablet (250 mg total) by mouth every Monday, Wednesday, and Friday.   Breztri  Aerosphere 160-9-4.8 MCG/ACT Aero inhaler Generic drug: budesonide -glycopyrrolate -formoterol  Inhale 2 puffs into the lungs every 12 (twelve) hours.   buPROPion  300 MG  24 hr tablet Commonly known as: WELLBUTRIN  XL Take 300 mg by mouth daily.   CALCIUM -VITAMIN D  PO Take 1 tablet by mouth daily.   carbidopa -levodopa  25-100 MG tablet Commonly known as: SINEMET  IR TAKE 1 TABLET BY MOUTH 3 TIMES A DAY   diclofenac  75 MG EC tablet Commonly known as: VOLTAREN  Take 1 tablet (75 mg total) by mouth 2 (two) times daily for 15 days.   furosemide  20 MG tablet Commonly known as: LASIX  Take 20 mg by mouth daily as needed for fluid.   guaiFENesin -dextromethorphan  100-10 MG/5ML syrup Commonly known as: ROBITUSSIN DM Take 5 mLs by mouth every 4 (four) hours as needed for cough (chest congestion).   HYDROcodone -acetaminophen  5-325 MG tablet Commonly known as: NORCO/VICODIN Take 1-2 tablets by mouth every 6 (six) hours as needed.   ipratropium-albuterol  0.5-2.5 (3) MG/3ML Soln Commonly known as: DUONEB Take 3 mLs by nebulization every 6 (six) hours as needed (shortness of breath).   levothyroxine  88 MCG tablet Commonly known as: SYNTHROID  Take 88 mcg by mouth daily before breakfast.   metoprolol  succinate 25 MG 24 hr tablet Commonly known as: TOPROL -XL Take 25 mg by mouth at bedtime.   Nucala  100 MG/ML Soaj Generic drug: Mepolizumab  Inject 1 mL (100 mg total) into the skin every 28 (twenty-eight) days.   polyethylene glycol 17 g packet Commonly known as: MIRALAX  / GLYCOLAX  Take 17 g by mouth daily as needed for moderate constipation.   potassium chloride  10 MEQ tablet Commonly known as: KLOR-CON  M Take 1 tablet (10 mEq total) by mouth daily as needed (when you take the lasix ).   rosuvastatin  10 MG tablet Commonly known as: CRESTOR  Take 1 tablet (10 mg total) by mouth at bedtime.   Systane Ultra 0.4-0.3 % Soln Generic drug: Polyethyl Glycol-Propyl Glycol Place 1 drop into both eyes 2 (two) times daily.   tiZANidine  4 MG tablet Commonly known as: ZANAFLEX  Take 4 mg by mouth daily as needed (severe muscle spasms).   VITAMIN B 12 PO Take 1  tablet by mouth at bedtime.        Follow-up Information     Arloa Jarvis, NP Follow up.   Specialty: Nurse Practitioner Why: transportation will pick you up at 12:40 for 1 p apt, bring ID , insurance card, and medications you take Contact information: [address] [place] [address] [phone]                Consultations: none  Procedures/Studies:  IR KYPHO LUMBAR INC FX REDUCE BONE BX UNI/BIL CANNULATION INC/IMAGING Result Date: 12/10/2023 INDICATION: Symptomatic L2 compression fracture status post falling from a standing height. EXAM: Fluoroscopy for bone augmentation at L2 COMPARISON:  None Available. MEDICATIONS: As antibiotic prophylaxis, 2 g Ancef  was ordered pre-procedure and administered intravenously within 1 hour of incision. All current medications are in the EMR and have been reviewed as part of this encounter. ANESTHESIA/SEDATION: Moderate (conscious) sedation was employed during this procedure. A total of Versed  2 mg and Fentanyl  100 mcg was administered intravenously by the radiology nurse. Total intra-service moderate Sedation Time: 36 minutes. The patient's level of consciousness and vital signs were  monitored continuously by radiology nursing throughout the procedure under my direct supervision. FLUOROSCOPY: Radiation Exposure Index (as provided by the fluoroscopic device): 938 mGy Kerma COMPLICATIONS: None immediate. PROCEDURE: Following a full explanation of the procedure along with the potential associated complications, an informed witnessed consent was obtained. The patient was placed prone on the fluoroscopic table. The skin overlying the lumbar region region was then prepped and draped in the usual sterile fashion. The right pedicle at L2 was then infiltrated with 0.25% bupivacaine  followed by the advancement of an 11-gauge Jamshidi needle through the right pedicle into the posterior one-third at L2. This was then exchanged for a Kyphon  advanced osteo introducer system comprised of a working cannula and a Kyphon osteo drill. This combination was then advanced over a Kyphon osteo bone pin until the tip of the Kyphon osteo drill was in the posterior third at L2. At this time, the bone pin was removed. In a medial trajectory, the combination was advanced until the tip of the working cannula was inside the posterior one-third at L2. Through the working cannula, a Kyphon bone biopsy device was advanced to within 5 mm of the anterior aspect of L2. Through the working cannula, a Kyphon inflatable bone balloon was advanced and positioned with the distal marker 5 mm from the anterior aspect of L2. Crossing of the midline was seen on the AP projection. At this time, the balloon was expanded using contrast via a Kyphon inflation syringe device via microtubing. Inflations were continued until there was apposition with the superior and the inferior endplates. At this time, methylmethacrylate mixture was reconstituted with Tobramycin in the Kyphon bone mixing device system. This was then loaded onto the Kyphon bone fillers. The balloon was deflated and removed followed by the instillation of bone filler equivalents of methylmethacrylate mixture at L2 with excellent filling in the AP and lateral projections. No extravasation was noted in the disk spaces or posteriorly into the spinal canal. No epidural venous contamination was seen. The working cannula and the bone filler were then retrieved and removed. IMPRESSION: 1. Status post fluoroscopic-guided needle for vertebral body augmentation using balloon kyphoplasty at L2 as described without event. If the patient has known osteoporosis, recommend treatment as clinically indicated. If the patient's bone density status is unknown, DEXA scan is recommended. Electronically Signed   By: Cordella Banner   On: 12/10/2023 15:56   CT Lumbar Spine Wo Contrast Result Date: 12/09/2023 EXAM: CT OF THE LUMBAR SPINE WITHOUT  CONTRAST 12/09/2023 12:59:03 AM TECHNIQUE: CT of the lumbar spine was performed without the administration of intravenous contrast. Multiplanar reformatted images are provided for review. Automated exposure control, iterative reconstruction, and/or weight based adjustment of the mA/kV was utilized to reduce the radiation dose to as low as reasonably achievable. COMPARISON: 12/17/2023 CLINICAL HISTORY: Back trauma, no prior imaging (Age >= 16y) FINDINGS: BONES AND ALIGNMENT: There is a new incomplete burst fracture of L2 with approximately 50% central height loss and 2 mm of retropulsion. Other visualized vertebral body heights appear maintained. Alignment is normal except for the retropulsion at L2. No suspicious bone lesion. DEGENERATIVE CHANGES: No significant degenerative changes. SOFT TISSUES: Calcific aortic atherosclerosis. IMPRESSION: 1. New incomplete burst fracture of L2 with approximately 50% central height loss and 2 mm of retropulsion. Electronically signed by: Franky Stanford MD 12/09/2023 01:09 AM EST RP Workstation: [id number]   CT Angio Chest PE W and/or Wo Contrast Result Date: 12/08/2023 EXAM: CTA of the Chest with and without contrast for PE  12/08/2023 10:04:24 PM TECHNIQUE: CTA of the chest was performed after the administration of 75 mL of iohexol  (OMNIPAQUE ) 350 MG/ML injection. Multiplanar reformatted images are provided for review. MIP images are provided for review. Automated exposure control, iterative reconstruction, and/or weight based adjustment of the mA/kV was utilized to reduce the radiation dose to as low as reasonably achievable. COMPARISON: Remote prior examination 12/14/2022. CLINICAL HISTORY: Pulmonary embolism (PE) suspected, high prob. FINDINGS: PULMONARY ARTERIES: Pulmonary arteries are adequately opacified for evaluation. No pulmonary embolism. Main pulmonary artery is normal in caliber. MEDIASTINUM: The heart and pericardium demonstrate no acute abnormality. Mild  atherosclerotic calcification within the thoracic aorta. LYMPH NODES: No mediastinal, hilar or axillary lymphadenopathy. LUNGS AND PLEURA: Severe emphysema. Motion artifact slightly limits evaluation of the pulmonary parenchyma. 5 mm noncalcified pulmonary nodule within the right apex (23/6) stable since remote prior examination 12/14/2022. No pneumothorax or pleural effusion. UPPER ABDOMEN: Status post cholecystectomy. Limited images of the upper abdomen are otherwise unremarkable. SOFT TISSUES AND BONES: Segmentation anomaly with incomplete segmentation T12-L1. Superior endplate fracture L2 appears acute, and appears new from, it is incompletely included on this examination and not well assessed. IMPRESSION: 1. No pulmonary embolism. 2. Suspected acute superior endplate fracture of L2, new from prior examination, incompletely included and not well assessed. Correlation for point tenderness and possible dedicated CT imaging of the lumbar spine is recommended for further evaluation. 3. Severe emphysema. Electronically signed by: Dorethia Molt MD 12/08/2023 11:23 PM EST RP Workstation: [id number]   DG Chest Portable 1 View Result Date: 12/08/2023 EXAM: 1 VIEW(S) XRAY OF THE CHEST 12/08/2023 04:59:10 PM COMPARISON: 09/29/2023 CLINICAL HISTORY: COPD/SHOB FINDINGS: LUNGS AND PLEURA: Stable hyperinflation of the lungs. No focal pulmonary opacity. No pleural effusion. No pneumothorax. HEART AND MEDIASTINUM: No acute abnormality of the cardiac and mediastinal silhouettes. BONES AND SOFT TISSUES: Status post left shoulder arthroplasty. No acute osseous abnormality. IMPRESSION: 1. Stable hyperinflation of the lungs, compatible with COPD. Electronically signed by: Lynwood Seip MD 12/08/2023 05:42 PM EST RP Workstation: [id number]   CT Cervical Spine Wo Contrast Result Date: 12/06/2023 EXAM: CT CERVICAL, THORACIC, AND LUMBAR SPINE WITHOUT INTRAVENOUS CONTRAST 12/06/2023 05:55:31 PM TECHNIQUE: CT of the cervical, thoracic  and lumbar spine was performed without the administration of intravenous contrast. Multiplanar reformatted images are provided for review. Automated exposure control, iterative reconstruction, and/or weight based adjustment of the mA/kV was utilized to reduce the radiation dose to as low as reasonably achievable. COMPARISON: CT cervical spine 11/16/2023; thoracic spine radiograph 07/21/2023 and MRI 01/26/2022; MRI of the lumbar spine 08/16/2023. CLINICAL HISTORY: Neck trauma (Age >= 65y) FINDINGS: CERVICAL SPINE: BONES AND ALIGNMENT: There is no acute fracture or traumatic malalignment. DEGENERATIVE CHANGES: Mild spondylosis and facet arthropathy throughout the cervical spine. No severe spinal canal narrowing. SOFT TISSUES: There is no prevertebral soft tissue swelling. THORACIC SPINE: BONE AND ALIGNMENT: Chronic inferior endplate compression fracture of T2. Ankylosis of T12 and L1. The vertebral body heights are maintained. No osseous destructive lesion is seen. DEGENERATIVE CHANGES: Atraumatic multilevel spondylosis in the thoracic spine. No severe spinal canal narrowing. No gross bony neural foraminal narrowing of the thoracic spine. SOFT TISSUES: No paraspinal mass or hematoma. LIMITED CHEST: Emphysema. LUMBAR SPINE: BONES AND ALIGNMENT: Bilateral chronic sacral ala fractures. Ankylosis of T12 and L1. The vertebral body heights are maintained. No osseous destructive lesion is seen. DEGENERATIVE CHANGES: Mild multilevel spondylosis and facet arthropathy in the lumbar spine. No severe spinal canal narrowing. No gross spinal canal stenosis or bony neural foraminal narrowing  of the lumbar spine. SOFT TISSUES: No paraspinal mass or hematoma. LIMITED RETROPERITONEUM: Limited images of the retroperitoneum demonstrate no acute abnormality. IMPRESSION: 1. No acute abnormality of the cervical, thoracic, or lumbar spine. 2. Bilateral chronic sacral ala fractures. Electronically signed by: Norman Gatlin MD 12/06/2023 07:00  PM EST RP Workstation: [id number]   CT Thoracic Spine Wo Contrast Result Date: 12/06/2023 EXAM: CT CERVICAL, THORACIC, AND LUMBAR SPINE WITHOUT INTRAVENOUS CONTRAST 12/06/2023 05:55:31 PM TECHNIQUE: CT of the cervical, thoracic and lumbar spine was performed without the administration of intravenous contrast. Multiplanar reformatted images are provided for review. Automated exposure control, iterative reconstruction, and/or weight based adjustment of the mA/kV was utilized to reduce the radiation dose to as low as reasonably achievable. COMPARISON: CT cervical spine 11/16/2023; thoracic spine radiograph 07/21/2023 and MRI 01/26/2022; MRI of the lumbar spine 08/16/2023. CLINICAL HISTORY: Neck trauma (Age >= 65y) FINDINGS: CERVICAL SPINE: BONES AND ALIGNMENT: There is no acute fracture or traumatic malalignment. DEGENERATIVE CHANGES: Mild spondylosis and facet arthropathy throughout the cervical spine. No severe spinal canal narrowing. SOFT TISSUES: There is no prevertebral soft tissue swelling. THORACIC SPINE: BONE AND ALIGNMENT: Chronic inferior endplate compression fracture of T2. Ankylosis of T12 and L1. The vertebral body heights are maintained. No osseous destructive lesion is seen. DEGENERATIVE CHANGES: Atraumatic multilevel spondylosis in the thoracic spine. No severe spinal canal narrowing. No gross bony neural foraminal narrowing of the thoracic spine. SOFT TISSUES: No paraspinal mass or hematoma. LIMITED CHEST: Emphysema. LUMBAR SPINE: BONES AND ALIGNMENT: Bilateral chronic sacral ala fractures. Ankylosis of T12 and L1. The vertebral body heights are maintained. No osseous destructive lesion is seen. DEGENERATIVE CHANGES: Mild multilevel spondylosis and facet arthropathy in the lumbar spine. No severe spinal canal narrowing. No gross spinal canal stenosis or bony neural foraminal narrowing of the lumbar spine. SOFT TISSUES: No paraspinal mass or hematoma. LIMITED RETROPERITONEUM: Limited images of the  retroperitoneum demonstrate no acute abnormality. IMPRESSION: 1. No acute abnormality of the cervical, thoracic, or lumbar spine. 2. Bilateral chronic sacral ala fractures. Electronically signed by: Norman Gatlin MD 12/06/2023 07:00 PM EST RP Workstation: [id number]   CT Lumbar Spine Wo Contrast Result Date: 12/06/2023 EXAM: CT CERVICAL, THORACIC, AND LUMBAR SPINE WITHOUT INTRAVENOUS CONTRAST 12/06/2023 05:55:31 PM TECHNIQUE: CT of the cervical, thoracic and lumbar spine was performed without the administration of intravenous contrast. Multiplanar reformatted images are provided for review. Automated exposure control, iterative reconstruction, and/or weight based adjustment of the mA/kV was utilized to reduce the radiation dose to as low as reasonably achievable. COMPARISON: CT cervical spine 11/16/2023; thoracic spine radiograph 07/21/2023 and MRI 01/26/2022; MRI of the lumbar spine 08/16/2023. CLINICAL HISTORY: Neck trauma (Age >= 65y) FINDINGS: CERVICAL SPINE: BONES AND ALIGNMENT: There is no acute fracture or traumatic malalignment. DEGENERATIVE CHANGES: Mild spondylosis and facet arthropathy throughout the cervical spine. No severe spinal canal narrowing. SOFT TISSUES: There is no prevertebral soft tissue swelling. THORACIC SPINE: BONE AND ALIGNMENT: Chronic inferior endplate compression fracture of T2. Ankylosis of T12 and L1. The vertebral body heights are maintained. No osseous destructive lesion is seen. DEGENERATIVE CHANGES: Atraumatic multilevel spondylosis in the thoracic spine. No severe spinal canal narrowing. No gross bony neural foraminal narrowing of the thoracic spine. SOFT TISSUES: No paraspinal mass or hematoma. LIMITED CHEST: Emphysema. LUMBAR SPINE: BONES AND ALIGNMENT: Bilateral chronic sacral ala fractures. Ankylosis of T12 and L1. The vertebral body heights are maintained. No osseous destructive lesion is seen. DEGENERATIVE CHANGES: Mild multilevel spondylosis and facet arthropathy in  the lumbar spine.  No severe spinal canal narrowing. No gross spinal canal stenosis or bony neural foraminal narrowing of the lumbar spine. SOFT TISSUES: No paraspinal mass or hematoma. LIMITED RETROPERITONEUM: Limited images of the retroperitoneum demonstrate no acute abnormality. IMPRESSION: 1. No acute abnormality of the cervical, thoracic, or lumbar spine. 2. Bilateral chronic sacral ala fractures. Electronically signed by: Norman Gatlin MD 12/06/2023 07:00 PM EST RP Workstation: [id number]   CT Head Wo Contrast Result Date: 12/06/2023 EXAM: CT HEAD WITHOUT 12/06/2023 05:55:31 PM TECHNIQUE: CT of the head was performed without the administration of intravenous contrast. Automated exposure control, iterative reconstruction, and/or weight based adjustment of the mA/kV was utilized to reduce the radiation dose to as low as reasonably achievable. COMPARISON: 11/16/2023 CLINICAL HISTORY: Head trauma, minor (Age >= 65y). FINDINGS: BRAIN AND VENTRICLES: No acute intracranial hemorrhage. No mass effect or midline shift. No extra-axial fluid collection. No evidence of acute infarct. No hydrocephalus. Moderate cerebral and cerebellar volume loss. Mild periventricular white matter disease. ORBITS: Status post bilateral lens replacement. SINUSES AND MASTOIDS: No acute abnormality. SOFT TISSUES AND SKULL: No acute skull fracture. No acute soft tissue abnormality. Carotid siphon vascular calcifications. IMPRESSION: 1. No acute intracranial abnormality. Electronically signed by: Norman Gatlin MD 12/06/2023 06:50 PM EST RP Workstation: [id number]   DG Wrist Complete Right Result Date: 11/17/2023 EXAM: 3 OR MORE VIEW(S) XRAY OF THE RIGHT WRIST 11/17/2023 04:38:00 PM COMPARISON: Comparison 11/16/2023. CLINICAL HISTORY: median nerve pain FINDINGS: BONES AND JOINTS: Stable appearance of distal right radial and ulnar fractures. No focal osseous lesion. No joint dislocation. SOFT TISSUES: Status post casting and  immobilization of the right wrist. IMPRESSION: 1. Stable alignment of distal right radial and ulnar fractures following casting and immobilization. Electronically signed by: Lynwood Seip MD 11/17/2023 05:27 PM EST RP Workstation: [id number]   CT Cervical Spine Wo Contrast Result Date: 11/16/2023 EXAM: CT CERVICAL SPINE WITHOUT CONTRAST 11/16/2023 07:28:43 PM TECHNIQUE: CT of the cervical spine was performed without the administration of intravenous contrast. Multiplanar reformatted images are provided for review. Automated exposure control, iterative reconstruction, and/or weight based adjustment of the mA/kV was utilized to reduce the radiation dose to as low as reasonably achievable. COMPARISON: 07/21/2023 CLINICAL HISTORY: Recent fall FINDINGS: CERVICAL SPINE: BONES AND ALIGNMENT: 7 cervical segments are well visualized. Vertebral body height is well maintained. No acute fracture or acute facet abnormality is seen. The odontoid is within normal limits. No traumatic malalignment. DEGENERATIVE CHANGES: Mild osteophytic changes are seen at C5-C6 and C6-C7. Facet hypertrophic changes are noted. SOFT TISSUES: Visualized lung apices demonstrate mild emphysematous change. Surrounding soft tissue structures are within normal limits. IMPRESSION: 1. No acute abnormality of the cervical spine related to the recent fall. 2. Mild cervical osteoarthritic changes at C5-C6 and C6-C7. 3. Incidental mild pulmonary emphysema; for patients aged 70, consider evaluation for a low-dose CT lung cancer screening program given emphysema as an independent risk factor for lung cancer. Electronically signed by: Oneil Devonshire MD 11/16/2023 07:33 PM EST RP Workstation: [id number]   CT Head Wo Contrast Result Date: 11/16/2023 EXAM: CT HEAD WITHOUT CONTRAST 11/16/2023 07:28:43 PM TECHNIQUE: CT of the head was performed without the administration of intravenous contrast. Automated exposure control, iterative reconstruction, and/or weight  based adjustment of the mA/kV was utilized to reduce the radiation dose to as low as reasonably achievable. COMPARISON: Comparison study 07/21/2023. CLINICAL HISTORY: Recent fall. FINDINGS: BRAIN AND VENTRICLES: Chronic atrophic changes are noted. No acute hemorrhage. No evidence of acute infarct. No extra-axial collection. No mass effect or midline  shift. ORBITS: No acute abnormality. SINUSES: No acute abnormality. SOFT TISSUES AND SKULL: No acute soft tissue abnormality. No skull fracture. IMPRESSION: 1. No acute intracranial abnormality. 2. Chronic atrophic changes. Electronically signed by: Oneil Devonshire MD 11/16/2023 07:31 PM EST RP Workstation: [id number]   DG Pelvis Portable Result Date: 11/16/2023 EXAM: 1 or 2 VIEW(S) XRAY OF THE PELVIS 11/16/2023 07:06:00 PM COMPARISON: 07/21/2023 CLINICAL HISTORY: pain pain FINDINGS: BONES AND JOINTS: Prior bilateral hip replacements. No hardware complicating feature. No acute fracture. No focal osseous lesion. No joint dislocation. SOFT TISSUES: The soft tissues are unremarkable. IMPRESSION: 1. No acute findings. 2. Prior bilateral hip replacements without hardware complication. Electronically signed by: Franky Crease MD 11/16/2023 07:09 PM EST RP Workstation: [id number]   DG Wrist Complete Right Result Date: 11/16/2023 EXAM: 3 OR MORE VIEW(S) XRAY OF THE RIGHT WRIST 11/16/2023 07:04:00 PM COMPARISON: None available. CLINICAL HISTORY: pain FINDINGS: BONES AND JOINTS: Distal right radial fracture with impaction and mild posterior angulation. Mildly displaced ulnar styloid fracture. Mild degenerative changes at the first carpometacarpal joint. No focal osseous lesion. No joint dislocation. SOFT TISSUES: The soft tissues are unremarkable. IMPRESSION: 1. Distal right radial fracture with impaction and mild posterior angulation. 2. Mildly displaced ulnar styloid fracture. Electronically signed by: Franky Crease MD 11/16/2023 07:08 PM EST RP Workstation: [id number]      Subjective: - no chest pain, shortness of breath, no abdominal pain, nausea or vomiting.   Discharge Exam: BP (!) 153/86 (BP Location: Right Arm)   Pulse 73   Temp (!) 97.5 F (36.4 C)   Resp 18   Ht 5' 6.5 (1.689 m)   Wt 63.2 kg   SpO2 96%   BMI 22.15 kg/m   General: Pt is alert, awake, not in acute distress Cardiovascular: RRR, S1/S2 +, no rubs, no gallops Respiratory: CTA bilaterally, no wheezing, no rhonchi Abdominal: Soft, NT, ND, bowel sounds + Extremities: no edema, no cyanosis    The results of significant diagnostics from this hospitalization (including imaging, microbiology, ancillary and laboratory) are listed below for reference.     Microbiology: Recent Results (from the past 240 hours)  Culture, blood (routine x 2)     Status: None   Collection Time: 12/08/23  4:55 PM   Specimen: BLOOD  Result Value Ref Range Status   Specimen Description BLOOD RIGHT ANTECUBITAL  Final   Special Requests   Final    BOTTLES DRAWN AEROBIC AND ANAEROBIC Blood Culture results may not be optimal due to an inadequate volume of blood received in culture bottles   Culture   Final    NO GROWTH 5 DAYS Performed at [facility], [address] [place], [place] [address]    Report Status 12/13/2023 FINAL  Final  Culture, blood (routine x 2)     Status: None   Collection Time: 12/08/23  5:02 PM   Specimen: BLOOD LEFT FOREARM  Result Value Ref Range Status   Specimen Description BLOOD LEFT FOREARM  Final   Special Requests   Final    BOTTLES DRAWN AEROBIC AND ANAEROBIC Blood Culture results may not be optimal due to an inadequate volume of blood received in culture bottles   Culture   Final    NO GROWTH 5 DAYS Performed at [facility], [address], [place], [place] [address]    Report Status 12/13/2023 FINAL  Final  Respiratory (~20 pathogens) panel by PCR     Status: None   Collection Time: 12/09/23 12:27 AM  Specimen: Nasopharyngeal Swab; Respiratory   Result Value Ref Range Status   Adenovirus NOT DETECTED NOT DETECTED Final   Coronavirus 229E NOT DETECTED NOT DETECTED Final    Comment: (NOTE) The Coronavirus on the Respiratory Panel, DOES NOT test for the novel  Coronavirus (2019 nCoV)    Coronavirus HKU1 NOT DETECTED NOT DETECTED Final   Coronavirus NL63 NOT DETECTED NOT DETECTED Final   Coronavirus OC43 NOT DETECTED NOT DETECTED Final   Metapneumovirus NOT DETECTED NOT DETECTED Final   Rhinovirus / Enterovirus NOT DETECTED NOT DETECTED Final   Influenza A NOT DETECTED NOT DETECTED Final   Influenza B NOT DETECTED NOT DETECTED Final   Parainfluenza Virus 1 NOT DETECTED NOT DETECTED Final   Parainfluenza Virus 2 NOT DETECTED NOT DETECTED Final   Parainfluenza Virus 3 NOT DETECTED NOT DETECTED Final   Parainfluenza Virus 4 NOT DETECTED NOT DETECTED Final   Respiratory Syncytial Virus NOT DETECTED NOT DETECTED Final   Bordetella pertussis NOT DETECTED NOT DETECTED Final   Bordetella Parapertussis NOT DETECTED NOT DETECTED Final   Chlamydophila pneumoniae NOT DETECTED NOT DETECTED Final   Mycoplasma pneumoniae NOT DETECTED NOT DETECTED Final    Comment: Performed at [facility], [address], [place], [place] [address]  SARS Coronavirus 2 by RT PCR (hospital order, performed in [facility] hospital lab) *cepheid single result test* Anterior Nasal Swab     Status: None   Collection Time: 12/09/23 10:41 AM   Specimen: Anterior Nasal Swab  Result Value Ref Range Status   SARS Coronavirus 2 by RT PCR NEGATIVE NEGATIVE Final    Comment: Performed at [facility], [address], [place], [place] [address]     Labs: Basic Metabolic Panel: Recent Labs  Lab 12/08/23 1645 12/08/23 1718 12/09/23 0258  NA 139 136 135  K 4.2 3.8 4.1  CL 100  --  101  CO2 23  --  22  GLUCOSE 122*  --  204*  BUN 26*  --  26*  CREATININE 1.46*  --  1.29*  CALCIUM  9.4  --  8.7*   Liver Function Tests: Recent Labs  Lab  12/09/23 0258  AST 27  ALT 27  ALKPHOS 97  BILITOT 0.8  PROT 6.0*  ALBUMIN 3.2*   CBC: Recent Labs  Lab 12/08/23 1645 12/08/23 1718 12/09/23 0258 12/10/23 0221  WBC 11.4*  --  7.8 9.9  HGB 13.0 9.9* 11.1* 10.1*  HCT 39.9 29.0* 34.9* 30.4*  MCV 95.0  --  97.2 93.8  PLT 194  --  173 169   CBG: No results for input(s): GLUCAP in the last 168 hours. Hgb A1c No results for input(s): HGBA1C in the last 72 hours. Lipid Profile No results for input(s): CHOL, HDL, LDLCALC, TRIG, CHOLHDL, LDLDIRECT in the last 72 hours. Thyroid  function studies No results for input(s): TSH, T4TOTAL, T3FREE, THYROIDAB in the last 72 hours.  Invalid input(s): FREET3 Urinalysis    Component Value Date/Time   COLORURINE YELLOW 09/30/2023 0400   APPEARANCEUR HAZY (A) 09/30/2023 0400   LABSPEC 1.014 09/30/2023 0400   PHURINE 5.0 09/30/2023 0400   GLUCOSEU NEGATIVE 09/30/2023 0400   HGBUR NEGATIVE 09/30/2023 0400   BILIRUBINUR NEGATIVE 09/30/2023 0400   KETONESUR NEGATIVE 09/30/2023 0400   PROTEINUR NEGATIVE 09/30/2023 0400   UROBILINOGEN 1.0 09/29/2012 1247   NITRITE NEGATIVE 09/30/2023 0400   LEUKOCYTESUR NEGATIVE 09/30/2023 0400    FURTHER DISCHARGE INSTRUCTIONS:   Get Medicines reviewed and adjusted: Please take all  your medications with you for your next visit with your Primary MD   Laboratory/radiological data: Please request your Primary MD to go over all hospital tests and procedure/radiological results at the follow up, please ask your Primary MD to get all Hospital records sent to his/her office.   In some cases, they will be blood work, cultures and biopsy results pending at the time of your discharge. Please request that your primary care M.D. goes through all the records of your hospital data and follows up on these results.   Also Note the following: If you experience worsening of your admission symptoms, develop shortness of breath, life threatening  emergency, suicidal or homicidal thoughts you must seek medical attention immediately by calling 911 or calling your MD immediately  if symptoms less severe.   You must read complete instructions/literature along with all the possible adverse reactions/side effects for all the Medicines you take and that have been prescribed to you. Take any new Medicines after you have completely understood and accpet all the possible adverse reactions/side effects.    Do not drive when taking Pain medications or sleeping medications (Benzodaizepines)   Do not take more than prescribed Pain, Sleep and Anxiety Medications. It is not advisable to combine anxiety,sleep and pain medications without talking with your primary care practitioner   Special Instructions: If you have smoked or chewed Tobacco  in the last 2 yrs please stop smoking, stop any regular Alcohol   and or any Recreational drug use.   Wear Seat belts while driving.   Please note: You were cared for by a hospitalist during your hospital stay. Once you are discharged, your primary care physician will handle any further medical issues. Please note that NO REFILLS for any discharge medications will be authorized once you are discharged, as it is imperative that you return to your primary care physician (or establish a relationship with a primary care physician if you do not have one) for your post hospital discharge needs so that they can reassess your need for medications and monitor your lab values.  Time coordinating discharge: 35 minutes  SIGNED:  Nilda Fendt, MD, PhD 12/13/2023, 10:45 AM

## 2023-12-13 NOTE — Progress Notes (Signed)
 This chaplain responded to the medical team's consult for Pt. prayer.   The Pt. is anticipating d/c to [facility] today. The chaplain listened reflectively as the Pt. ate her lunch and talked about returning home without a personally acceptable plan for rehab. The Pt. concluded the visit with the possibility of taking one day at a time solution for rehab.  The Pt. accepted the chaplain's invitation for intercessory prayer.  Chaplain Leeroy Hummer [phone]

## 2023-12-13 NOTE — Evaluation (Signed)
 Clinical/Bedside Swallow Evaluation Patient Details  Name: NAVYA TIMMONS MRN: [id number] Date of Birth: [birth year]  Today's Date: 12/13/2023 Time: SLP Start Time (ACUTE ONLY): 0841 SLP Stop Time (ACUTE ONLY): 0904 SLP Time Calculation (min) (ACUTE ONLY): 23 min  Past Medical History:  Past Medical History:  Diagnosis Date   Allergy    Anemia    Anxiety    Anxiety and depression    related to caring for mother during terminal illness   Arthritis    Asthma    AVN (avascular necrosis of bone) (HCC)    hip and wrist   Bronchitis    hx of   Cataract    Chronic kidney disease    COPD (chronic obstructive pulmonary disease) (HCC)    Depression    Emphysema of lung (HCC)    Endometriosis    FH: CAD (coronary artery disease)    GERD (gastroesophageal reflux disease)    ocassional   History of blood transfusion    after hip repackment - broke out in hives and started itching   History of palpitations    Hyperlipidemia    Hypertension    Hypothyroid    Neuromuscular disorder (HCC)    Non-ischemic cardiomyopathy (HCC) 2019   Osteopenia    forteo through Dr. Dolphus (started 10/12)   Osteoporosis    PMR (polymyalgia rheumatica)    SIRS (systemic inflammatory response syndrome) (HCC) 10/2017   Smoker    SOB (shortness of breath) on exertion    Squamous cell carcinoma    facial, 2016   Temporal arteritis (HCC)    s/p prednisone  taper   Past Surgical History:  Past Surgical History:  Procedure Laterality Date   ABDOMINAL ADHESION SURGERY     ABDOMINAL HYSTERECTOMY     BREAST EXCISIONAL BIOPSY Left    BREAST REDUCTION SURGERY Bilateral 06/13/2019   Procedure: MAMMARY REDUCTION  (BREAST);  Surgeon: Elisabeth Craig RAMAN, MD;  Location: [facility] OR;  Service: Plastics;  Laterality: Bilateral;   BREAST SURGERY Left    benign bx 1990   CARDIAC CATHETERIZATION  2007   no PCI   CATARACT EXTRACTION Bilateral    CHOLECYSTECTOMY     COLONOSCOPY     COLONOSCOPY W/ POLYPECTOMY      INCONTINENCE SURGERY     2007    IR KYPHO LUMBAR INC FX REDUCE BONE BX UNI/BIL CANNULATION INC/IMAGING  12/10/2023   JOINT REPLACEMENT Left 2012   left hip   REDUCTION MAMMAPLASTY     2021   REVERSE SHOULDER ARTHROPLASTY Left 05/28/2020   Procedure: REVERSE SHOULDER ARTHROPLASTY;  Surgeon: Sharl Selinda Dover, MD;  Location: [facility] OR;  Service: Orthopedics;  Laterality: Left;  2.5 hrs   TONSILLECTOMY     TOTAL HIP ARTHROPLASTY Right 10/04/2012   Procedure: TOTAL HIP ARTHROPLASTY;  Surgeon: Maude LELON Right, MD;  Location: [facility] OR;  Service: Orthopedics;  Laterality: Right;   TOTAL SHOULDER REPLACEMENT Left    WRIST SURGERY Left    2012/ left wrist/ bone removed due to necrosis   HPI:  Pt is a 68 y.o. F presenting to [facility] on 12/08/23 with SOB; admitted with COPD exacerbation and L2 fx (pending surgery). PMH is significant for COPD, chronic hypoxic respiratory failure on 2L, HTN, HLD, CKD, hypothyroidism, anxiety and depression. Recently diagnosed with PSP.    Assessment / Plan / Recommendation  Clinical Impression  Patient presents with clinical swallowing function concerning for potantial pharyngeal dysphagia though unable to confirm with objective imaging. Patient endorses choking episodes  with liquids and soft solids that occur 2-3 times each meal. She remarks that most recent occurrence was with breakfast prior to SLP arrival, and of note, she was coughing ambiently prior to SLP bolus presentation. OME unremarkable, though at rest patient's face is mildly asymmetrical with slight labial droop on R side. SLP administered thin liquids nad pureed solids. Across both consistencies, patient demonstrated intermittent delayed cough however due to cough present prior to item presentation, unable to determine if cough is due to airway misdirection at bedside. Recommend instrumental swallow study to objectively assess oropharyngeal swallowing function prior to formal diet recommendation. Patient in  agreement with plan. SLP Visit Diagnosis: Dysphagia, unspecified (R13.10)    Aspiration Risk  Mild aspiration risk    Diet Recommendation    Will defer formal diet recommendation until results from MBS are rendered        Other Recommendations Oral Care Recommendations: Oral care BID     Swallow Evaluation Recommendations Recommendations:  (will defer formal recommendation until MBS results are rendered) Oral care recommendations: Oral care BID (2x/day)   Assistance Recommended at Discharge    Functional Status Assessment Patient has had a recent decline in their functional status and demonstrates the ability to make significant improvements in function in a reasonable and predictable amount of time.  Frequency and Duration min 2x/week  2 weeks       Prognosis Prognosis for improved oropharyngeal function: Good      Swallow Study   General Date of Onset: 12/08/23 HPI: Pt is a 68 y.o. F presenting to [facility] on 12/08/23 with SOB; admitted with COPD exacerbation and L2 fx (pending surgery). PMH is significant for COPD, chronic hypoxic respiratory failure on 2L, HTN, HLD, CKD, hypothyroidism, anxiety and depression. Recently diagnosed with PSP. Type of Study: Bedside Swallow Evaluation Previous Swallow Assessment: n/a Diet Prior to this Study: Regular;Thin liquids (Level 0) Temperature Spikes Noted: No Respiratory Status: Room air History of Recent Intubation: No Behavior/Cognition: Alert;Cooperative;Pleasant mood Oral Cavity Assessment: Within Functional Limits Oral Care Completed by SLP: No Oral Cavity - Dentition: Adequate natural dentition Vision: Functional for self-feeding Self-Feeding Abilities: Able to feed self Patient Positioning: Upright in bed Baseline Vocal Quality: Normal Volitional Cough: Strong Volitional Swallow: Able to elicit    Oral/Motor/Sensory Function Overall Oral Motor/Sensory Function: Within functional limits   Ice Chips Ice chips: Not tested    Thin Liquid Thin Liquid: Impaired Presentation: Self Fed;Straw Pharyngeal  Phase Impairments: Cough - Delayed    Nectar Thick Nectar Thick Liquid: Not tested   Honey Thick Honey Thick Liquid: Not tested   Puree Puree: Impaired Presentation: Self Fed;Spoon Pharyngeal Phase Impairments: Cough - Delayed   Solid     Solid: Not tested     Rosina Downy, M.A., CCC-SLP  Rosina A Torrez Renfroe 12/13/2023,12:01 PM

## 2023-12-20 ENCOUNTER — Other Ambulatory Visit (HOSPITAL_COMMUNITY): Payer: Self-pay

## 2023-12-22 ENCOUNTER — Other Ambulatory Visit: Payer: Self-pay

## 2023-12-22 ENCOUNTER — Other Ambulatory Visit

## 2023-12-22 NOTE — Progress Notes (Signed)
 Specialty Pharmacy Refill Coordination Note  April [name] is a 68 y.o. female contacted today regarding refills of specialty medication(s) Mepolizumab  (Nucala )   Patient requested Delivery   Delivery date: 12/28/23   Verified address: [address] [place], [place], [address]   Medication will be filled on: 12/27/23

## 2023-12-24 ENCOUNTER — Encounter (HOSPITAL_BASED_OUTPATIENT_CLINIC_OR_DEPARTMENT_OTHER): Admitting: Pulmonary Disease

## 2024-01-17 ENCOUNTER — Other Ambulatory Visit: Payer: Self-pay

## 2024-01-18 ENCOUNTER — Other Ambulatory Visit: Payer: Self-pay

## 2024-01-18 ENCOUNTER — Telehealth: Payer: Self-pay

## 2024-01-18 NOTE — Telephone Encounter (Signed)
 Enrolled patient in asthma grant for $1500 through [employer]. We discussed that this will cover this month's copay, but unknown what next month's copay will be. Discussed that M3P remains an option for next month.   Eligibility start date: 10/19/2023 Eligibility end date: 01/15/2025 Assistance amount:$1500  Patient ID: [id number] Group ID: [id number] RxBin ID: [id number] PCN: [id number]

## 2024-01-18 NOTE — Telephone Encounter (Signed)
 Called patient to discuss grant enrollment - she verbalizes understanding of what we discussed regarding grant enrollment and option to consider M3P if next month's copay is unaffordable.

## 2024-01-18 NOTE — Telephone Encounter (Signed)
 Received notification via specialty pharmacy encounter that pt's copay for Nucala  is currently $1300.46. Sent message to pt to see if she would like to enroll in [facility]. Will await pt's response.

## 2024-01-19 ENCOUNTER — Other Ambulatory Visit: Payer: Self-pay

## 2024-01-21 ENCOUNTER — Other Ambulatory Visit (HOSPITAL_COMMUNITY): Payer: Self-pay

## 2024-01-24 ENCOUNTER — Other Ambulatory Visit: Payer: Self-pay

## 2024-01-25 ENCOUNTER — Other Ambulatory Visit: Payer: Self-pay

## 2024-01-25 NOTE — Progress Notes (Signed)
 Specialty Pharmacy Refill Coordination Note  April [name] is a 69 y.o. female contacted today regarding refills of specialty medication(s) Mepolizumab  (Nucala )   Patient requested Delivery   Delivery date: 01/28/24   Verified address: [facility] [address] [address]   Medication will be filled on: 01/27/24

## 2024-01-27 ENCOUNTER — Other Ambulatory Visit: Payer: Self-pay

## 2024-02-09 ENCOUNTER — Encounter (HOSPITAL_BASED_OUTPATIENT_CLINIC_OR_DEPARTMENT_OTHER): Payer: Self-pay | Admitting: Pulmonary Disease

## 2024-02-09 ENCOUNTER — Ambulatory Visit (HOSPITAL_BASED_OUTPATIENT_CLINIC_OR_DEPARTMENT_OTHER): Admitting: Pulmonary Disease

## 2024-02-09 VITALS — BP 110/88 | HR 92 | Ht 66.5 in | Wt 140.0 lb

## 2024-02-09 DIAGNOSIS — F17211 Nicotine dependence, cigarettes, in remission: Secondary | ICD-10-CM

## 2024-02-09 DIAGNOSIS — R490 Dysphonia: Secondary | ICD-10-CM | POA: Diagnosis not present

## 2024-02-09 DIAGNOSIS — J432 Centrilobular emphysema: Secondary | ICD-10-CM

## 2024-02-09 DIAGNOSIS — R911 Solitary pulmonary nodule: Secondary | ICD-10-CM | POA: Diagnosis not present

## 2024-02-09 MED ORDER — BREZTRI AEROSPHERE 160-9-4.8 MCG/ACT IN AERO: 2.0000 | INHALATION_SPRAY | Freq: Two times a day (BID) | RESPIRATORY_TRACT | 12 refills | Status: AC

## 2024-02-09 MED ORDER — AZITHROMYCIN 250 MG PO TABS: 250.0000 mg | ORAL_TABLET | ORAL | 6 refills | Status: AC

## 2024-02-09 MED ORDER — IPRATROPIUM-ALBUTEROL 0.5-2.5 (3) MG/3ML IN SOLN: 3.0000 mL | RESPIRATORY_TRACT | 6 refills | Status: AC | PRN

## 2024-02-09 MED ORDER — ALBUTEROL SULFATE HFA 108 (90 BASE) MCG/ACT IN AERS: 2.0000 | INHALATION_SPRAY | Freq: Four times a day (QID) | RESPIRATORY_TRACT | 6 refills | Status: AC | PRN

## 2024-02-09 NOTE — Patient Instructions (Signed)
" °  VISIT SUMMARY: During your visit, we discussed your COPD management, the stability of your pulmonary nodule, your persistent hoarseness, and your upcoming pneumonia vaccination.  YOUR PLAN: CHRONIC OBSTRUCTIVE PULMONARY DISEASE: You have significant COPD with recent exacerbations. Your condition has improved with your current medications. -Continue taking Breztri , azithromycin , albuterol -ipratropium nebulizer, and Nucala  injections. -Ensure you have access to your albuterol -ipratropium nebulizer for use as needed.  PULMONARY NODULE, RIGHT UPPER LOBE: Your pulmonary nodule has been stable since 2019 and is likely benign. -You are scheduled for your next annual CT scan in December 2026 to monitor the nodule.  DYSPHONIA: You have had worsening hoarseness over the past six months. -You are referred to an ENT specialist for further evaluation of your hoarseness.  LUNG CANCER SCREENING: You are due for a pneumonia vaccination. -We will administer your pneumonia vaccination in October 2026.    Contains text generated by [employer].   "

## 2024-02-09 NOTE — Progress Notes (Signed)
 "  Established Patient Pulmonology Office Visit   Subjective:  Patient ID: April [name], female    DOB: October 22, 1955  MRN: [id number]  CC:  Chief Complaint  Patient presents with   COPD    HPI  Discussed the use of AI scribe software for clinical note transcription with the patient, who gave verbal consent to proceed.  History of Present Illness April [name] is a 69 year old female with Grade IV, Class E COPD who presents for follow-up of her respiratory condition.  She has a history of COPD with significant progression noted in a 2020 breathing test and was hospitalized once in the past year due to a severe exacerbation. Her current medications include Breztri , azithromycin  three times a week, and a combination of albuterol  and ipratropium via nebulizer, which she is supposed to use twice daily. However, she has not received her nebulizer treatment this week despite requesting it three times. She started Nucala  injections approximately six months ago and has received five shots so far. She has not noticed a significant difference yet but has not experienced any exacerbations since starting the treatment.  She reports a persistent hoarse voice, which has worsened over the past six months. She last saw an ENT in 2020, who attributed the hoarseness to acid reflux and started her on medication. She quit smoking eight years ago.  She has a lung nodule in the right upper lung, first identified in 2019, and has been undergoing annual CT scans to monitor the nodule. This nodule has been stable.  She has been taking diuretics to manage ankle swelling, which was severe earlier this week but has improved with treatment. She follows a low-salt diet, a practice she adopted since her mother was ill.  Her last COPD exacerbations with admission to the hospital were documented in July, September, and December of the previous year. She recalls the September and December episodes. She has received her  flu shot and is due for a pneumonia shot in October.  CAT > 10, MMRC > 2, recurrent exacerbations.  ROS   Current Medications[1]      Objective:  BP 110/88   Pulse 92   Ht 5' 6.5 (1.689 m)   Wt 140 lb (63.5 kg)   SpO2 95%   BMI 22.26 kg/m  Wt Readings from Last 3 Encounters:  02/09/24 140 lb (63.5 kg)  12/09/23 139 lb 5.3 oz (63.2 kg)  11/16/23 149 lb 14.6 oz (68 kg)   BMI Readings from Last 3 Encounters:  02/09/24 22.26 kg/m  12/09/23 22.15 kg/m  11/16/23 24.20 kg/m   SpO2 Readings from Last 3 Encounters:  02/09/24 95%  12/13/23 96%  12/06/23 95%   Physical Exam General: chronically ill appearing, thin, frail Eyes: PERRL, no scleral icterus ENMT: oropharynx clear, good dentition, no oral lesions, mallampati score I Skin: warm, intact, no rashes Neck: JVD flat, ROM and lymph node assessment normal CV: RRR, no MRG, nl S1 and S2, 1+ pitting peripheral edema Resp: scattered wheezes, + clubbing Neuro: Awake alert oriented to person place time and situation  Diagnostic Review:  Last CBC Lab Results  Component Value Date   WBC 9.9 12/10/2023   HGB 10.1 (L) 12/10/2023   HCT 30.4 (L) 12/10/2023   MCV 93.8 12/10/2023   MCH 31.2 12/10/2023   RDW 14.9 12/10/2023   PLT 169 12/10/2023   Last metabolic panel Lab Results  Component Value Date   GLUCOSE 204 (H) 12/09/2023   NA 135 12/09/2023  K 4.1 12/09/2023   CL 101 12/09/2023   CO2 22 12/09/2023   BUN 26 (H) 12/09/2023   CREATININE 1.29 (H) 12/09/2023   GFRNONAA 45 (L) 12/09/2023   CALCIUM  8.7 (L) 12/09/2023   PHOS 3.6 08/06/2022   PROT 6.0 (L) 12/09/2023   ALBUMIN 3.2 (L) 12/09/2023   BILITOT 0.8 12/09/2023   ALKPHOS 97 12/09/2023   AST 27 12/09/2023   ALT 27 12/09/2023   ANIONGAP 12 12/09/2023   CTA chest 12/08/2023: IMPRESSION: 1. No pulmonary embolism. 2. Suspected acute superior endplate fracture of L2, new from prior examination, incompletely included and not well assessed. Correlation for  point tenderness and possible dedicated CT imaging of the lumbar spine is recommended for further evaluation. 3. Severe emphysema.  PFTs 2020: very severe obstructive defect with severe reduction in diffusion capacity.    Assessment & Plan:   Assessment & Plan Chronic hoarseness Dysphonia Hoarseness worsening over six months, last evaluated by ENT in 2020 for acid reflux, no lesions noted on larygnoscopy. - Referred to ENT for evaluation of dysphonia. Centrilobular emphysema (HCC) Chronic obstructive pulmonary disease, Grade IV, Class E Significant COPD with recent exacerbations. Improvement noted with Breztri  and Nucala . Nucala  expected to reduce exacerbation frequency. - Continue Breztri , azithromycin  MWF, albuterol -ipratropium nebulizer, and Nucala  injections Q 4 weeks - Ensure availability of albuterol -ipratropium nebulizer for use as needed. - May consider referral to pulmonary rehab in the future Solitary pulmonary nodule Pulmonary nodule, right upper lobe Pulmonary nodule stable since 2019, likely benign. - Scheduled annual CT scan for December 2026 as part of the lung cancer screening program Cigarette nicotine dependence in remission   Orders Placed This Encounter  Procedures   Ambulatory referral to ENT   Ambulatory Referral for Lung Cancer Scre   I spent 30 minutes reviewing patient's chart including prior consultant notes, imaging, and PFTs as well as face-to-face with the patient, over half in discussion of the diagnosis and the importance of compliance with the treatment plan.  Return in about 6 months (around 08/08/2024).   Taline Nass, MD     [1]  Current Outpatient Medications:    acetaminophen  (TYLENOL ) 500 MG tablet, Take 1,000 mg by mouth 2 (two) times daily as needed for headache or fever (pain)., Disp: , Rfl:    budesonide -glycopyrrolate -formoterol  (BREZTRI  AEROSPHERE) 160-9-4.8 MCG/ACT AERO inhaler, Inhale 2 puffs into the lungs every 12 (twelve)  hours., Disp: 32.1 g, Rfl: 3   budesonide -glycopyrrolate -formoterol  (BREZTRI  AEROSPHERE) 160-9-4.8 MCG/ACT AERO inhaler, Inhale 2 puffs into the lungs in the morning and at bedtime., Disp: 1 each, Rfl: 12   buPROPion  (WELLBUTRIN  XL) 300 MG 24 hr tablet, Take 300 mg by mouth daily., Disp: , Rfl:    Calcium  Carb-Cholecalciferol  (CALCIUM -VITAMIN D  PO), Take 1 tablet by mouth daily., Disp: , Rfl:    carbidopa -levodopa  (SINEMET  IR) 25-100 MG tablet, TAKE 1 TABLET BY MOUTH 3 TIMES A DAY, Disp: 270 tablet, Rfl: 1   Cyanocobalamin  (VITAMIN B 12 PO), Take 1 tablet by mouth at bedtime., Disp: , Rfl:    furosemide  (LASIX ) 20 MG tablet, Take 20 mg by mouth daily as needed for fluid., Disp: , Rfl:    guaiFENesin -dextromethorphan  (ROBITUSSIN DM) 100-10 MG/5ML syrup, Take 5 mLs by mouth every 4 (four) hours as needed for cough (chest congestion)., Disp: 118 mL, Rfl: 0   ipratropium-albuterol  (DUONEB) 0.5-2.5 (3) MG/3ML SOLN, Take 3 mLs by nebulization every 6 (six) hours as needed (shortness of breath)., Disp: , Rfl:    ipratropium-albuterol  (DUONEB) 0.5-2.5 (3)  MG/3ML SOLN, Take 3 mLs by nebulization every 4 (four) hours as needed., Disp: 360 mL, Rfl: 6   levothyroxine  (SYNTHROID ) 88 MCG tablet, Take 88 mcg by mouth daily before breakfast., Disp: , Rfl:    Mepolizumab  (NUCALA ) 100 MG/ML SOAJ, Inject 1 mL (100 mg total) into the skin every 28 (twenty-eight) days., Disp: 1 mL, Rfl: 3   metoprolol  succinate (TOPROL -XL) 25 MG 24 hr tablet, Take 25 mg by mouth at bedtime., Disp: , Rfl:    polyethylene glycol (MIRALAX  / GLYCOLAX ) 17 g packet, Take 17 g by mouth daily as needed for moderate constipation., Disp: , Rfl:    potassium chloride  (KLOR-CON  M) 10 MEQ tablet, Take 1 tablet (10 mEq total) by mouth daily as needed (when you take the lasix )., Disp: 30 tablet, Rfl: 3   rosuvastatin  (CRESTOR ) 10 MG tablet, Take 1 tablet (10 mg total) by mouth at bedtime., Disp: 90 tablet, Rfl: 3   SYSTANE ULTRA 0.4-0.3 % SOLN, Place 1  drop into both eyes 2 (two) times daily., Disp: 30 mL, Rfl: 0   tiZANidine  (ZANAFLEX ) 4 MG tablet, Take 4 mg by mouth daily as needed (severe muscle spasms)., Disp: , Rfl:    albuterol  (VENTOLIN  HFA) 108 (90 Base) MCG/ACT inhaler, Inhale 2 puffs into the lungs every 6 (six) hours as needed for wheezing or shortness of breath., Disp: 18 g, Rfl: 6   azithromycin  (ZITHROMAX ) 250 MG tablet, Take 1 tablet (250 mg total) by mouth every Monday, Wednesday, and Friday., Disp: 36 tablet, Rfl: 6   [Paused] valsartan  (DIOVAN ) 40 MG tablet, Take 40 mg by mouth daily. (Patient not taking: Reported on 02/09/2024), Disp: , Rfl:   "

## 2024-02-10 ENCOUNTER — Other Ambulatory Visit: Payer: Self-pay

## 2024-02-10 DIAGNOSIS — Z87891 Personal history of nicotine dependence: Secondary | ICD-10-CM

## 2024-02-10 DIAGNOSIS — Z122 Encounter for screening for malignant neoplasm of respiratory organs: Secondary | ICD-10-CM

## 2024-02-24 ENCOUNTER — Ambulatory Visit: Admitting: Neurology

## 2024-03-02 IMAGING — DX DG CHEST 2V
2 series · 2 of 2 positions shown · non-contrast
Comparison: September 27, 2020.

CLINICAL DATA: Shortness of breath, cough.

EXAM:
CHEST - 2 VIEW

[chest pa]
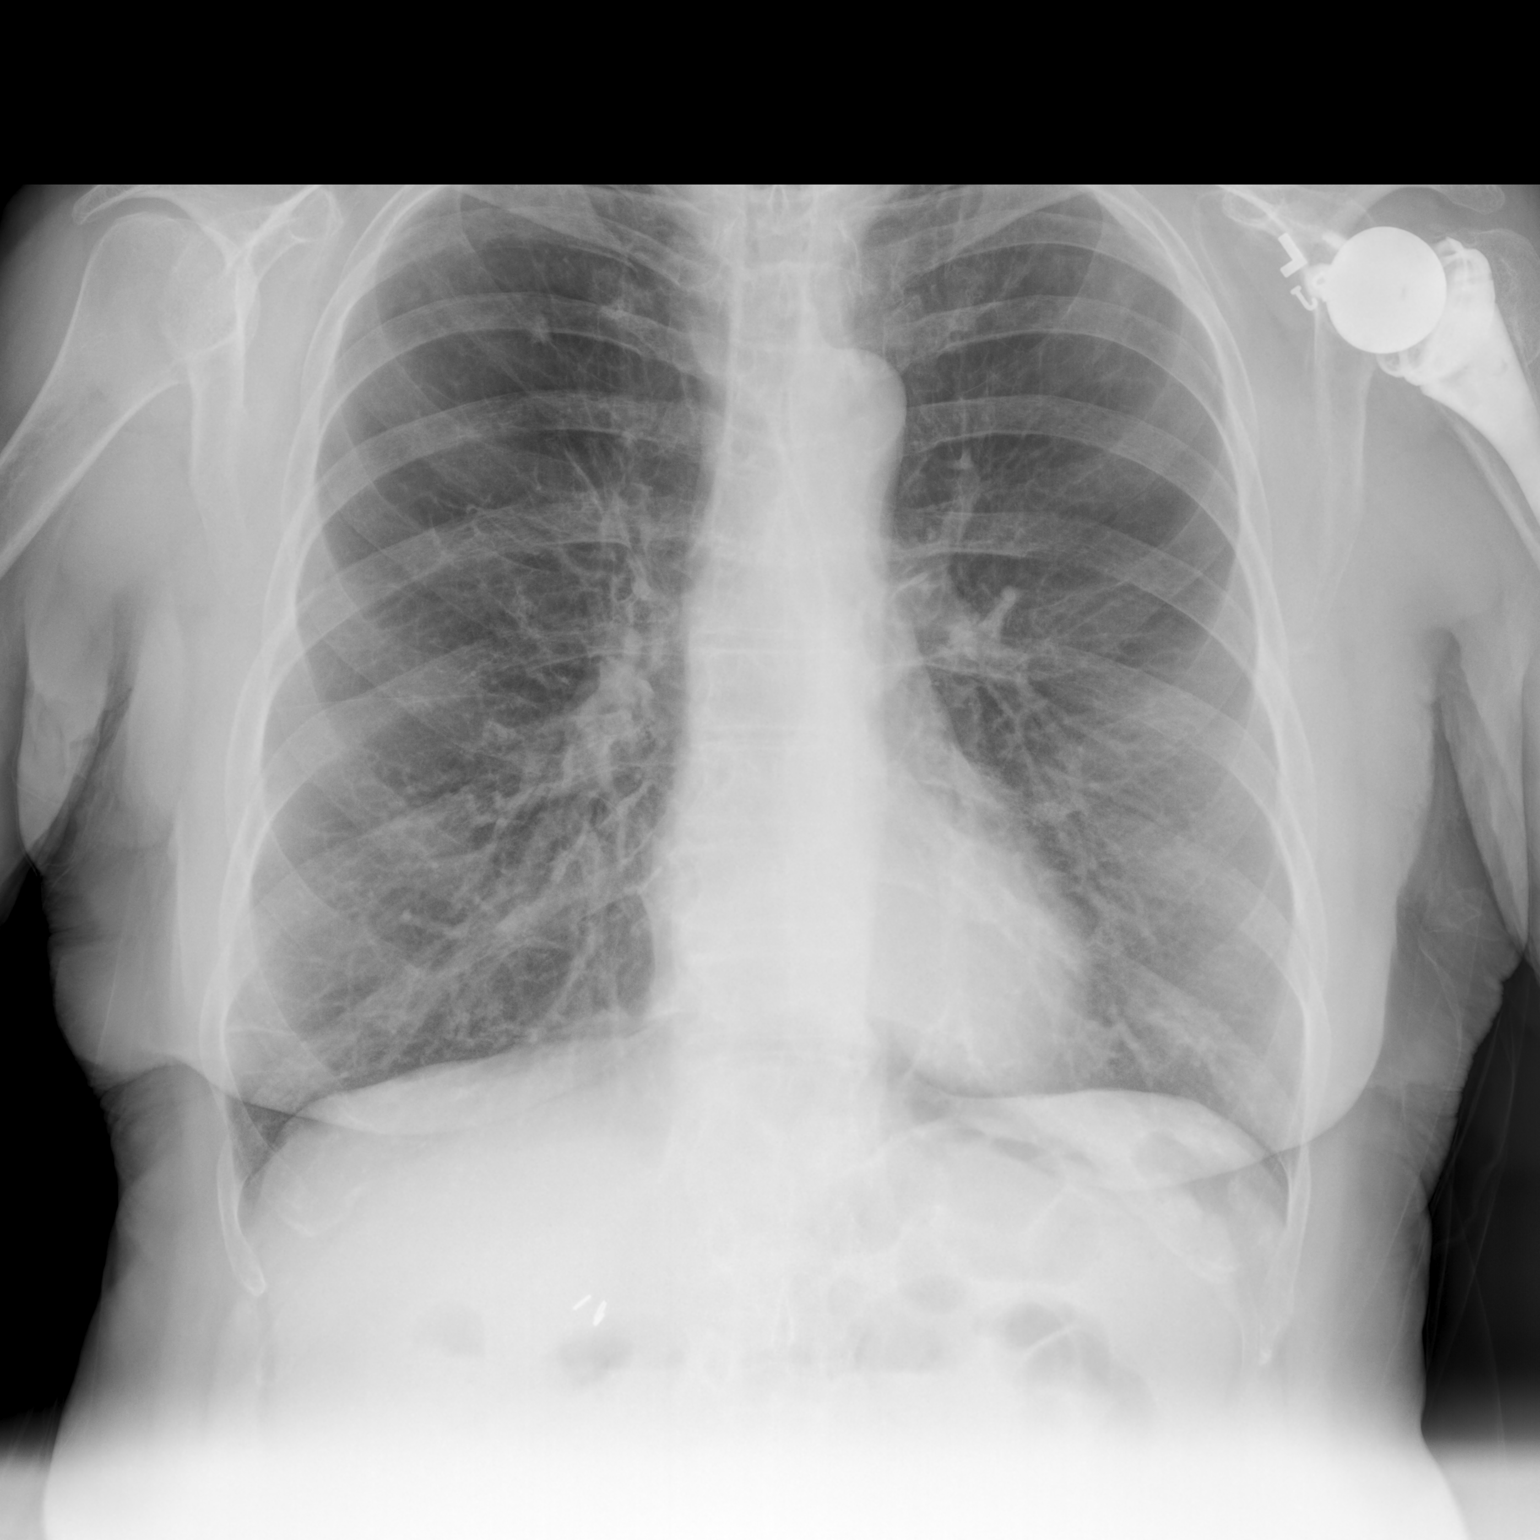

[chest lat]
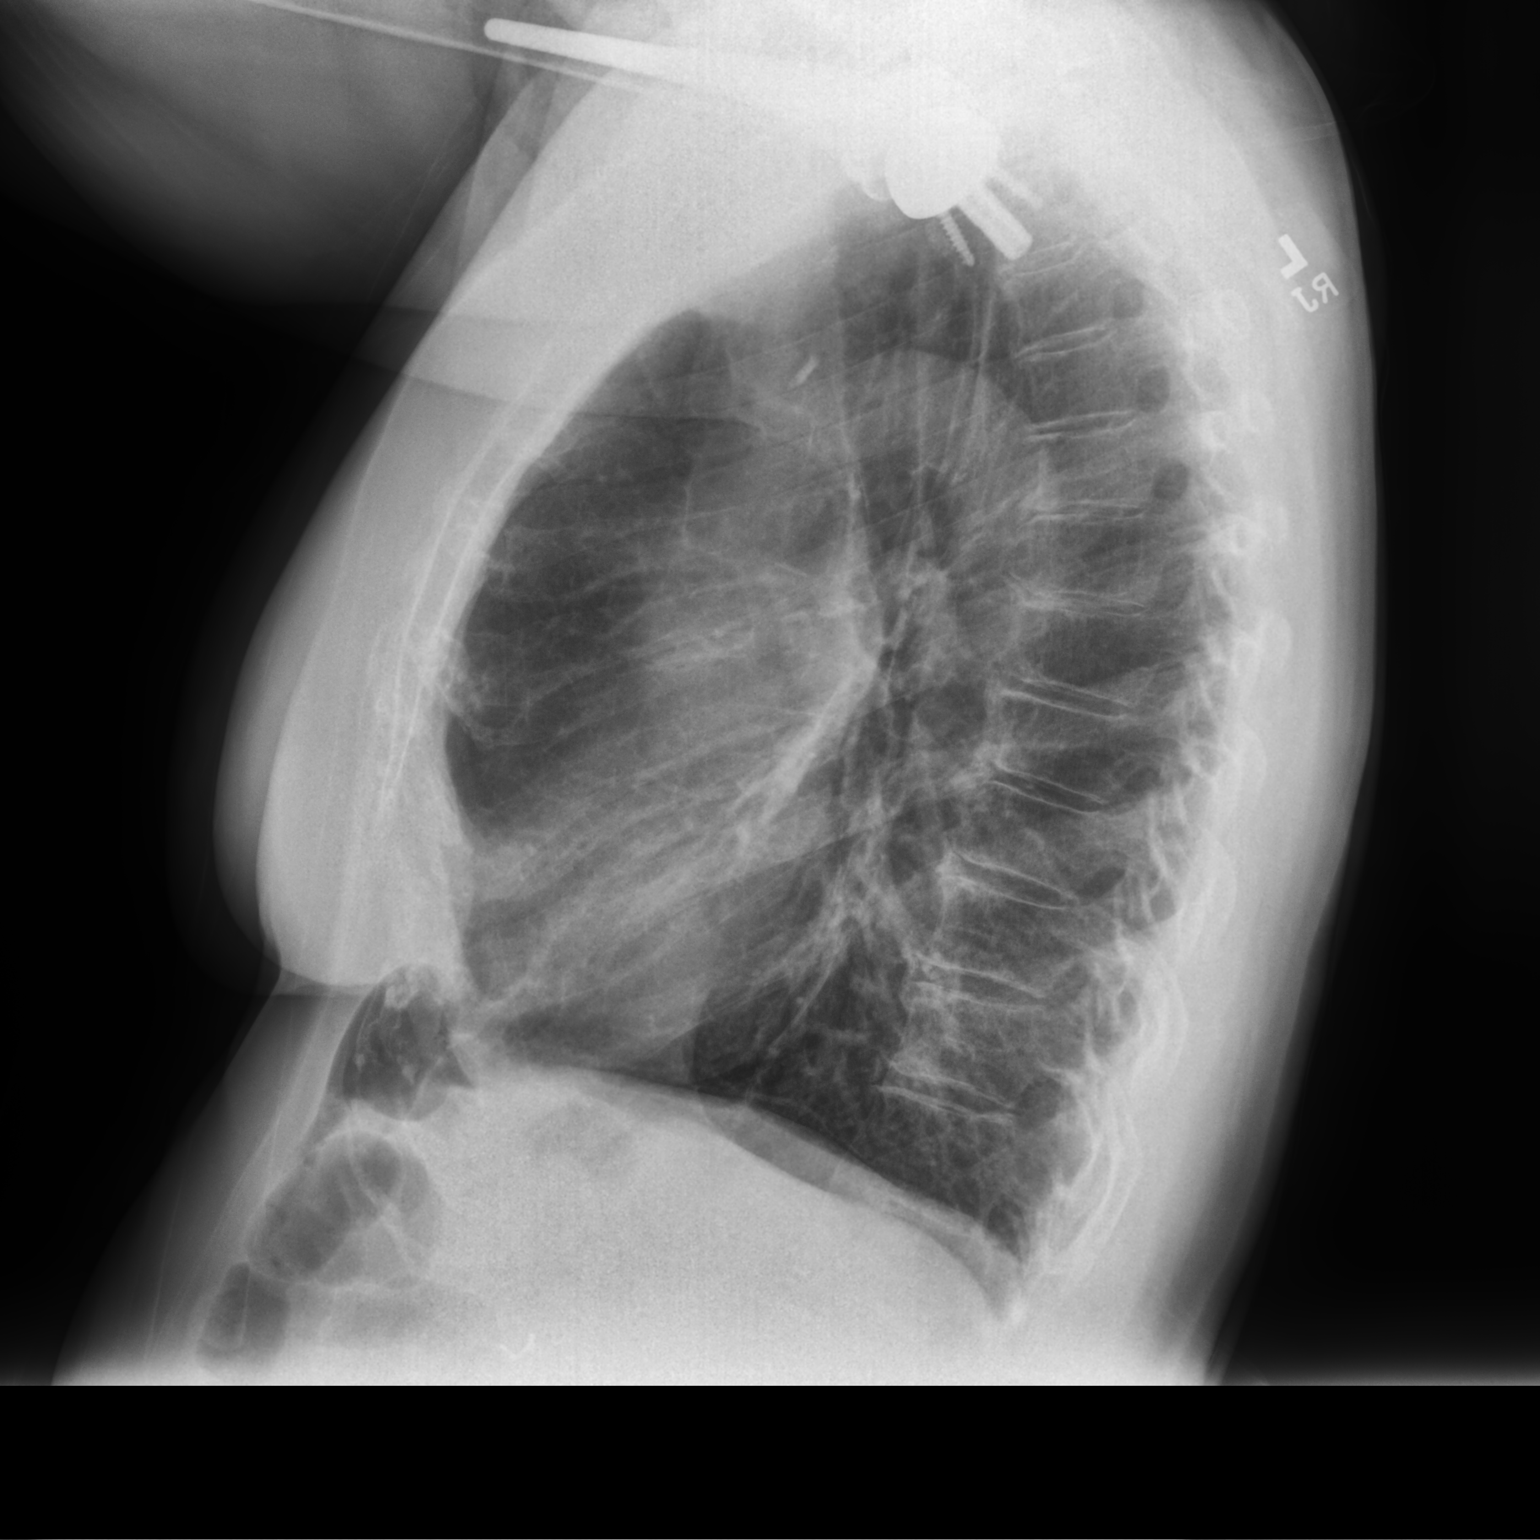

[2 of 2 positions shown; findings below may reference images not displayed]

FINDINGS: The heart size and mediastinal contours are within normal limits.
Both lungs are clear. Status post left shoulder arthroplasty.
IMPRESSION: No active cardiopulmonary disease.

## 2024-04-28 ENCOUNTER — Ambulatory Visit: Payer: Self-pay | Admitting: Neurology
